# Patient Record
Sex: Male | Born: 1935 | Race: White | Hispanic: No | Marital: Married | Smoking: Never smoker
Health system: Southern US, Community
[De-identification: ages and names within clinical notes are randomized; demographics above are authoritative.]

## PROBLEM LIST (undated history)

## (undated) DIAGNOSIS — T4145XA Adverse effect of unspecified anesthetic, initial encounter: Secondary | ICD-10-CM

## (undated) DIAGNOSIS — C801 Malignant (primary) neoplasm, unspecified: Secondary | ICD-10-CM

## (undated) DIAGNOSIS — T8859XA Other complications of anesthesia, initial encounter: Secondary | ICD-10-CM

## (undated) DIAGNOSIS — H9190 Unspecified hearing loss, unspecified ear: Secondary | ICD-10-CM

## (undated) DIAGNOSIS — D649 Anemia, unspecified: Secondary | ICD-10-CM

## (undated) DIAGNOSIS — M1990 Unspecified osteoarthritis, unspecified site: Secondary | ICD-10-CM

## (undated) DIAGNOSIS — E785 Hyperlipidemia, unspecified: Secondary | ICD-10-CM

## (undated) DIAGNOSIS — I10 Essential (primary) hypertension: Secondary | ICD-10-CM

## (undated) DIAGNOSIS — E119 Type 2 diabetes mellitus without complications: Secondary | ICD-10-CM

## (undated) DIAGNOSIS — N400 Enlarged prostate without lower urinary tract symptoms: Secondary | ICD-10-CM

## (undated) HISTORY — PX: HAND SURGERY: SHX662

## (undated) HISTORY — DX: Malignant (primary) neoplasm, unspecified: C80.1

## (undated) HISTORY — PX: APPENDECTOMY: SHX54

## (undated) HISTORY — PX: KNEE SURGERY: SHX244

## (undated) HISTORY — DX: Essential (primary) hypertension: I10

## (undated) HISTORY — DX: Unspecified hearing loss, unspecified ear: H91.90

---

## 2005-07-24 ENCOUNTER — Ambulatory Visit: Payer: Self-pay | Admitting: Urology

## 2006-04-26 ENCOUNTER — Ambulatory Visit: Payer: Self-pay

## 2006-11-08 ENCOUNTER — Ambulatory Visit: Payer: Self-pay

## 2008-09-21 ENCOUNTER — Ambulatory Visit: Payer: Self-pay | Admitting: General Surgery

## 2008-09-21 HISTORY — PX: COLONOSCOPY: SHX174

## 2010-10-15 ENCOUNTER — Emergency Department: Payer: Self-pay | Admitting: Emergency Medicine

## 2012-02-21 ENCOUNTER — Emergency Department: Payer: Self-pay | Admitting: Internal Medicine

## 2013-09-05 ENCOUNTER — Ambulatory Visit: Payer: Self-pay | Admitting: General Surgery

## 2013-10-04 ENCOUNTER — Encounter: Payer: Self-pay | Admitting: *Deleted

## 2014-01-18 DIAGNOSIS — H9190 Unspecified hearing loss, unspecified ear: Secondary | ICD-10-CM | POA: Insufficient documentation

## 2014-01-18 DIAGNOSIS — I10 Essential (primary) hypertension: Secondary | ICD-10-CM | POA: Insufficient documentation

## 2014-12-30 ENCOUNTER — Ambulatory Visit
Admission: EM | Admit: 2014-12-30 | Discharge: 2014-12-30 | Disposition: A | Discharge status: Home / Self Care | Payer: Medicare Other | Attending: Internal Medicine | Admitting: Internal Medicine

## 2014-12-30 DIAGNOSIS — J42 Unspecified chronic bronchitis: Secondary | ICD-10-CM

## 2014-12-30 DIAGNOSIS — J209 Acute bronchitis, unspecified: Secondary | ICD-10-CM

## 2014-12-30 HISTORY — DX: Benign prostatic hyperplasia without lower urinary tract symptoms: N40.0

## 2014-12-30 HISTORY — DX: Hyperlipidemia, unspecified: E78.5

## 2014-12-30 HISTORY — DX: Type 2 diabetes mellitus without complications: E11.9

## 2014-12-30 MED ORDER — AZITHROMYCIN 250 MG PO TABS: 250.0000 mg | ORAL_TABLET | Freq: Every day | ORAL | Status: DC

## 2014-12-30 MED ORDER — PREDNISONE 50 MG PO TABS: 50.0000 mg | ORAL_TABLET | Freq: Every day | ORAL | Status: AC

## 2014-12-30 NOTE — ED Notes (Signed)
Patient states that originally started as a runny nose and has lead into coughing. Patient states that he is not sleeping at night because he is coughing so much. Patient states that he has a productive cough.

## 2014-12-30 NOTE — ED Provider Notes (Signed)
CSN: [id number]     Arrival date & time 12/30/14  1021 History   First MD Initiated Contact with Patient 12/30/14 1211     Chief Complaint  Patient presents with  . Cough    Started 1 week ago    HPI   Runny nose for the last week, prod cough for the last 2 nights.  No fever. Initially some sore throat, resolved now. No N/V, not achey.  Little bit of sore belly with coughing.  Hx bronchitis.  Past Medical History  Diagnosis Date  . Hypertension   . Hyperlipidemia   . Diabetes type 2, controlled   . BPH (benign prostatic hypertrophy)    Past Surgical History  Procedure Laterality Date  . Hand surgery    . Knee surgery Left   . Appendectomy    . Colonoscopy  09/21/08    1 polyp found, tubular adenoma   Family History  Problem Relation Age of Onset  . Heart disease Father   . Aneurysm Mother   . Aneurysm Brother    History  Substance Use Topics  . Smoking status: Never Smoker   . Smokeless tobacco: Never Used  . Alcohol Use: No    Review of Systems  All other systems reviewed and are negative.   Allergies  Review of patient's allergies indicates no known allergies.  Home Medications   Prior to Admission medications   Medication Sig Start Date End Date Taking? Authorizing Provider  finasteride (PROSCAR) 5 MG tablet Take 5 mg by mouth daily.   Yes Historical Provider, MD  lisinopril (PRINIVIL,ZESTRIL) 10 MG tablet Take 10 mg by mouth daily.   Yes Historical Provider, MD  metformin (FORTAMET) 1000 MG (OSM) 24 hr tablet Take 1,000 mg by mouth 2 (two) times daily with a meal.   Yes Historical Provider, MD  simvastatin (ZOCOR) 40 MG tablet Take 40 mg by mouth daily.   Yes Historical Provider, MD   BP 168/73 mmHg  Pulse 93  Temp(Src) 98.6 F (37 C) (Oral)  Ht 6\' 1"  (1.854 m)  Wt 187 lb (84.823 kg)  BMI 24.68 kg/m2  SpO2 94% Physical Exam  Constitutional: He is oriented to person, place, and time. He appears well-developed and well-nourished. No distress.  HENT:   Head: Atraumatic.  Hard of hearing, B hearing aids. B TMs dull, no erythema. Marked nasal congestion. Throat a little red.  Eyes:  Conjugate gaze, no eye redness/drainage  Neck: Neck supple.  Cardiovascular: Normal rate and regular rhythm.   Pulmonary/Chest: Effort normal. No respiratory distress. He has wheezes.  Symmetric breath sounds, wheezing throughout. Scattered rhonchi.  Abdominal: He exhibits no distension.  Musculoskeletal: Normal range of motion.  Neurological: He is alert and oriented to person, place, and time.  Skin: Skin is warm and dry.  Nursing note and vitals reviewed.   ED Course  Procedures (including critical care time) Labs Review Labs Reviewed - No data to display  Imaging Review No results found.   MDM  No diagnosis found. exacerabation of chronic bronchitis    Sherlene Shams, MD 12/30/14 2128

## 2016-07-21 ENCOUNTER — Ambulatory Visit: Payer: Medicare Other | Admitting: Podiatry

## 2017-08-17 ENCOUNTER — Inpatient Hospital Stay
Admission: AD | Admit: 2017-08-17 | Discharge: 2017-08-19 | DRG: 812 | Disposition: A | Discharge status: Home / Self Care | Payer: Medicare Other | Source: Ambulatory Visit | Attending: Internal Medicine | Admitting: Internal Medicine

## 2017-08-17 ENCOUNTER — Other Ambulatory Visit: Payer: Self-pay

## 2017-08-17 ENCOUNTER — Encounter: Payer: Self-pay | Admitting: Internal Medicine

## 2017-08-17 DIAGNOSIS — Z7984 Long term (current) use of oral hypoglycemic drugs: Secondary | ICD-10-CM

## 2017-08-17 DIAGNOSIS — E860 Dehydration: Secondary | ICD-10-CM | POA: Diagnosis present

## 2017-08-17 DIAGNOSIS — E119 Type 2 diabetes mellitus without complications: Secondary | ICD-10-CM | POA: Diagnosis present

## 2017-08-17 DIAGNOSIS — D649 Anemia, unspecified: Principal | ICD-10-CM | POA: Diagnosis present

## 2017-08-17 DIAGNOSIS — D709 Neutropenia, unspecified: Secondary | ICD-10-CM

## 2017-08-17 DIAGNOSIS — N400 Enlarged prostate without lower urinary tract symptoms: Secondary | ICD-10-CM | POA: Diagnosis present

## 2017-08-17 DIAGNOSIS — Z7982 Long term (current) use of aspirin: Secondary | ICD-10-CM

## 2017-08-17 DIAGNOSIS — I10 Essential (primary) hypertension: Secondary | ICD-10-CM | POA: Diagnosis present

## 2017-08-17 DIAGNOSIS — E785 Hyperlipidemia, unspecified: Secondary | ICD-10-CM | POA: Diagnosis present

## 2017-08-17 DIAGNOSIS — H9190 Unspecified hearing loss, unspecified ear: Secondary | ICD-10-CM | POA: Diagnosis present

## 2017-08-17 DIAGNOSIS — Z79899 Other long term (current) drug therapy: Secondary | ICD-10-CM

## 2017-08-17 LAB — COMPREHENSIVE METABOLIC PANEL
ALBUMIN: 3.8 g/dL (ref 3.5–5.0)
ALK PHOS: 29 U/L — AB (ref 38–126)
ALT: 13 U/L — AB (ref 17–63)
AST: 20 U/L (ref 15–41)
Anion gap: 8 (ref 5–15)
BUN: 26 mg/dL — ABNORMAL HIGH (ref 6–20)
CALCIUM: 8.6 mg/dL — AB (ref 8.9–10.3)
CO2: 25 mmol/L (ref 22–32)
CREATININE: 0.77 mg/dL (ref 0.61–1.24)
Chloride: 103 mmol/L (ref 101–111)
GFR calc Af Amer: >60 mL/min (ref >60)
GFR calc non Af Amer: >60 mL/min (ref >60)
GLUCOSE: 230 mg/dL — AB (ref 65–99)
Potassium: 4.2 mmol/L (ref 3.5–5.1)
SODIUM: 136 mmol/L (ref 135–145)
Total Bilirubin: 0.5 mg/dL (ref 0.3–1.2)
Total Protein: 6.3 g/dL — ABNORMAL LOW (ref 6.5–8.1)

## 2017-08-17 LAB — RETICULOCYTES
RBC.: 2.29 MIL/uL — AB (ref 4.40–5.90)
Retic Count, Absolute: 59.5 10*3/uL (ref 19.0–183.0)
Retic Ct Pct: 2.6 % (ref 0.4–3.1)

## 2017-08-17 LAB — FERRITIN: Ferritin: 106 ng/mL (ref 24–336)

## 2017-08-17 LAB — IRON AND TIBC
Iron: 98 ug/dL (ref 45–182)
Saturation Ratios: 34 % (ref 17.9–39.5)
TIBC: 289 ug/dL (ref 250–450)
UIBC: 191 ug/dL

## 2017-08-17 LAB — CBC
HCT: 21.3 % — ABNORMAL LOW (ref 40.0–52.0)
HEMOGLOBIN: 6.7 g/dL — AB (ref 13.0–18.0)
MCH: 29.4 pg (ref 26.0–34.0)
MCHC: 31.7 g/dL — AB (ref 32.0–36.0)
MCV: 92.6 fL (ref 80.0–100.0)
Platelets: 203 10*3/uL (ref 150–440)
RBC: 2.29 MIL/uL — ABNORMAL LOW (ref 4.40–5.90)
RDW: 20.8 % — ABNORMAL HIGH (ref 11.5–14.5)
WBC: 3 10*3/uL — ABNORMAL LOW (ref 3.8–10.6)

## 2017-08-17 LAB — TSH: TSH: 2.688 u[IU]/mL (ref 0.350–4.500)

## 2017-08-17 LAB — PROTIME-INR
INR: 1.02
PROTHROMBIN TIME: 13.3 s (ref 11.4–15.2)

## 2017-08-17 LAB — GLUCOSE, CAPILLARY
Glucose-Capillary: 265 mg/dL — ABNORMAL HIGH (ref 65–99)
Glucose-Capillary: 172 mg/dL — ABNORMAL HIGH (ref 65–99)

## 2017-08-17 LAB — FOLATE: Folate: 22 ng/mL (ref >5.9)

## 2017-08-17 LAB — PREPARE RBC (CROSSMATCH)

## 2017-08-17 LAB — ABO/RH: ABO/RH(D): B POS

## 2017-08-17 MED ORDER — LISINOPRIL 10 MG PO TABS
10.0000 mg | ORAL_TABLET | Freq: Every day | ORAL | Status: DC
  Administered 2017-08-18 – 2017-08-19 (×2): 10 mg via ORAL
  Filled 2017-08-17 (×2): qty 1

## 2017-08-17 MED ORDER — ACETAMINOPHEN 325 MG PO TABS: 650.0000 mg | ORAL_TABLET | Freq: Four times a day (QID) | ORAL | Status: DC | PRN

## 2017-08-17 MED ORDER — ONDANSETRON HCL 4 MG PO TABS: 4.0000 mg | ORAL_TABLET | Freq: Four times a day (QID) | ORAL | Status: DC | PRN

## 2017-08-17 MED ORDER — SODIUM CHLORIDE 0.9 % IV SOLN
INTRAVENOUS | Status: DC
  Administered 2017-08-17: 21:00:00 via INTRAVENOUS

## 2017-08-17 MED ORDER — ONDANSETRON HCL 4 MG/2ML IJ SOLN: 4.0000 mg | Freq: Four times a day (QID) | INTRAMUSCULAR | Status: DC | PRN

## 2017-08-17 MED ORDER — METFORMIN HCL ER 500 MG PO TB24
1000.0000 mg | ORAL_TABLET | Freq: Two times a day (BID) | ORAL | Status: DC
  Administered 2017-08-18 – 2017-08-19 (×3): 1000 mg via ORAL
  Filled 2017-08-17 (×4): qty 2

## 2017-08-17 MED ORDER — INSULIN ASPART 100 UNIT/ML ~~LOC~~ SOLN: 0.0000 [IU] | Freq: Every day | SUBCUTANEOUS | Status: DC

## 2017-08-17 MED ORDER — SODIUM CHLORIDE 0.9 % IV SOLN
Freq: Once | INTRAVENOUS | Status: AC
  Administered 2017-08-17: 23:00:00 via INTRAVENOUS

## 2017-08-17 MED ORDER — ACETAMINOPHEN 650 MG RE SUPP: 650.0000 mg | Freq: Four times a day (QID) | RECTAL | Status: DC | PRN

## 2017-08-17 MED ORDER — INSULIN ASPART 100 UNIT/ML ~~LOC~~ SOLN
0.0000 [IU] | Freq: Three times a day (TID) | SUBCUTANEOUS | Status: DC
  Administered 2017-08-18 – 2017-08-19 (×4): 2 [IU] via SUBCUTANEOUS
  Filled 2017-08-17 (×3): qty 1

## 2017-08-17 MED ORDER — SIMVASTATIN 40 MG PO TABS
40.0000 mg | ORAL_TABLET | Freq: Every day | ORAL | Status: DC
  Administered 2017-08-17: 40 mg via ORAL
  Filled 2017-08-17 (×2): qty 1

## 2017-08-17 MED ORDER — FINASTERIDE 5 MG PO TABS
5.0000 mg | ORAL_TABLET | Freq: Every day | ORAL | Status: DC
  Administered 2017-08-18 – 2017-08-19 (×2): 5 mg via ORAL
  Filled 2017-08-17 (×2): qty 1

## 2017-08-17 NOTE — H&P (Signed)
[facility] [place] NAME: Paul Pham    MR#:  [id number]  DATE OF BIRTH:  1926/08/19  DATE OF ADMISSION:  08/17/2017  PRIMARY CARE PHYSICIAN: Patient, No Pcp Per   REQUESTING/REFERRING PHYSICIAN: Dr. Ginette Pitman  CHIEF COMPLAINT:  No chief complaint on file.   HISTORY OF PRESENT ILLNESS:  Paul Pham  is a 83 y.o. male with a known history of non-insulin-dependent diabetes mellitus, hyperlipidemia and BPH was sent in by his primary care physician secondary to anemia. Patient is very hard of hearing. Very pleasant elderly gentleman, who has been doing well up until the last month. He started having increased fatigue, dyspnea on a social which is unusual for him. He saw his PCP about 4 days ago and routine labs showed that his hemoglobin dropped from 10 to 7 in 2 months. In the past his hemoglobin was always around 13 up until March of this year. It dropped to 10 in October at which time he was started on iron pills. His stools are always dark from the iron pills, no unusual change, no active bleeding noted. Family denies any hematemesis, abdominal pain or any other bleeding. Patient only takes aspirin at home does not use any NSAIDS. Repeat hemoglobin here is at 6.7. He is being admitted for transfusion for his symptomatic anemia  PAST MEDICAL HISTORY:   Past Medical History:  Diagnosis Date  . BPH (benign prostatic hypertrophy)   . Diabetes type 2, controlled ([place])   . Hyperlipidemia   . Hypertension     PAST SURGICAL HISTORY:   Past Surgical History:  Procedure Laterality Date  . APPENDECTOMY    . COLONOSCOPY  09/21/08   1 polyp found, tubular adenoma  . HAND SURGERY    . KNEE SURGERY Left     SOCIAL HISTORY:   Social History   Tobacco Use  . Smoking status: Never Smoker  . Smokeless tobacco: Never Used  Substance Use Topics  . Alcohol use: No    Alcohol/week: 0.0 oz    FAMILY HISTORY:   Family History  Problem  Relation Age of Onset  . Aneurysm Mother   . Heart disease Father   . Aneurysm Brother     DRUG ALLERGIES:  No Known Allergies  REVIEW OF SYSTEMS:   Review of Systems  Constitutional: Negative for chills, fever, malaise/fatigue and weight loss.  HENT: Negative for ear discharge, ear pain, hearing loss, nosebleeds and tinnitus.   Eyes: Negative for blurred vision, double vision and photophobia.  Respiratory: Positive for shortness of breath. Negative for cough, hemoptysis and wheezing.   Cardiovascular: Negative for chest pain, palpitations, orthopnea and leg swelling.  Gastrointestinal: Negative for abdominal pain, constipation, diarrhea, heartburn, melena, nausea and vomiting.  Genitourinary: Negative for dysuria, frequency, hematuria and urgency.  Musculoskeletal: Negative for back pain, myalgias and neck pain.  Skin: Negative for rash.  Neurological: Positive for weakness. Negative for dizziness, tingling, tremors, sensory change, speech change, focal weakness and headaches.  Endo/Heme/Allergies: Does not bruise/bleed easily.  Psychiatric/Behavioral: Negative for depression.    MEDICATIONS AT HOME:   Prior to Admission medications   Medication Sig Start Date End Date Taking? Authorizing Provider  aspirin EC 81 MG tablet Take 81 mg by mouth daily.   Yes [provider]  finasteride (PROSCAR) 5 MG tablet Take 5 mg by mouth daily.   Yes [provider]  lisinopril (PRINIVIL,ZESTRIL) 10 MG tablet Take 10 mg by mouth daily.  Yes [provider]  metformin (FORTAMET) 1000 MG (OSM) 24 hr tablet Take 1,000 mg by mouth 2 (two) times daily with a meal.   Yes [provider]  simvastatin (ZOCOR) 40 MG tablet Take 40 mg by mouth daily.   Yes [provider]  azithromycin (ZITHROMAX Z-PAK) 250 MG tablet Take 1 tablet (250 mg total) by mouth daily. Take 2 tabs today, then 1 tab daily starting tomorrow Patient not taking: Reported on 08/17/2017  12/30/14   Sherlene Shams, MD      VITAL SIGNS:  Blood pressure (!) 143/50, pulse 82, temperature 98.6 F (37 C), temperature source Oral, resp. rate 20, height 6\' 1"  (1.854 m), weight 84.6 kg (186 lb 8 oz), SpO2 99 %.  PHYSICAL EXAMINATION:   Physical Exam  GENERAL:  83 y.o.-year-old elderly patient lying in the bed with no acute distress. Very hard of hearing EYES: Pupils equal, round, reactive to light and accommodation. No scleral icterus. Extraocular muscles intact.  HEENT: Head atraumatic, normocephalic. Oropharynx and nasopharynx clear.  NECK:  Supple, no jugular venous distention. No thyroid enlargement, no tenderness.  LUNGS: Normal breath sounds bilaterally, no wheezing, rales,rhonchi or crepitation. No use of accessory muscles of respiration.  CARDIOVASCULAR: S1, S2 normal. No murmurs, rubs, or gallops.  ABDOMEN: Soft, nontender, nondistended. Bowel sounds present. No organomegaly or mass.  EXTREMITIES: No pedal edema, cyanosis, or clubbing.  NEUROLOGIC: Cranial nerves II through XII are intact. Muscle strength 5/5 in all extremities. Sensation intact. Gait not checked.  PSYCHIATRIC: The patient is alert and oriented x 3.  SKIN: No obvious rash, lesion, or ulcer.   LABORATORY PANEL:   CBC Recent Labs  Lab 08/17/17 2024  WBC 3.0*  HGB 6.7*  HCT 21.3*  PLT 203   ------------------------------------------------------------------------------------------------------------------  Chemistries  No results for input(s): NA, K, CL, CO2, GLUCOSE, BUN, CREATININE, CALCIUM, MG, AST, ALT, ALKPHOS, BILITOT in the last 168 hours.  Invalid input(s): GFRCGP ------------------------------------------------------------------------------------------------------------------  Cardiac Enzymes No results for input(s): TROPONINI in the last 168 hours. ------------------------------------------------------------------------------------------------------------------  RADIOLOGY:  No  results found.  EKG:  No orders found for this or any previous visit.  IMPRESSION AND PLAN:   Paul Pham  is a 83 y.o. male with a known history of non-insulin-dependent diabetes mellitus, hyperlipidemia and BPH was sent in by his primary care physician secondary to anemia.  1. Acute anemia-unknown cause at this time. No active bleeding noted. -Last colonoscopy in 2010 showed only benign polyp. Stool for guaiac was negative as outpatient. -Steady drop in his WBC and hemoglobin noted. Could be a bone marrow issue. -Anemia labs ordered. Hematology consult. -If stable, further workup can be done as outpatient -2 units packed RBC transfusion ordered for symptomatic anemia and follow-up repeat hemoglobin tomorrow  2. Hypertension-continue lisinopril  3. Diabetes mellitus-on metformin and sliding scale insulin  4. Hyperlipidemia on statin  5. DVT prophylaxis-Ted's and SCDs    All the records are reviewed and case discussed with ED provider. Management plans discussed with the patient, family and they are in agreement.  CODE STATUS: Full Code  TOTAL TIME TAKING CARE OF THIS PATIENT: 50 minutes.    Gladstone Lighter M.D on 08/17/2017 at 9:44 PM  Between 7am to 6pm - Pager - [phone]  After 6pm go to [email or web address] - password [email or web address] [facility]  Office  [phone]  CC: Primary care physician; Patient, No Pcp Per

## 2017-08-18 DIAGNOSIS — Z7984 Long term (current) use of oral hypoglycemic drugs: Secondary | ICD-10-CM | POA: Diagnosis not present

## 2017-08-18 DIAGNOSIS — D649 Anemia, unspecified: Principal | ICD-10-CM

## 2017-08-18 DIAGNOSIS — E119 Type 2 diabetes mellitus without complications: Secondary | ICD-10-CM | POA: Diagnosis present

## 2017-08-18 DIAGNOSIS — E785 Hyperlipidemia, unspecified: Secondary | ICD-10-CM | POA: Diagnosis present

## 2017-08-18 DIAGNOSIS — D709 Neutropenia, unspecified: Secondary | ICD-10-CM | POA: Diagnosis present

## 2017-08-18 DIAGNOSIS — H9190 Unspecified hearing loss, unspecified ear: Secondary | ICD-10-CM | POA: Diagnosis present

## 2017-08-18 DIAGNOSIS — Z79899 Other long term (current) drug therapy: Secondary | ICD-10-CM | POA: Diagnosis not present

## 2017-08-18 DIAGNOSIS — I10 Essential (primary) hypertension: Secondary | ICD-10-CM | POA: Diagnosis present

## 2017-08-18 DIAGNOSIS — N400 Enlarged prostate without lower urinary tract symptoms: Secondary | ICD-10-CM | POA: Diagnosis present

## 2017-08-18 DIAGNOSIS — Z7982 Long term (current) use of aspirin: Secondary | ICD-10-CM | POA: Diagnosis not present

## 2017-08-18 DIAGNOSIS — D72819 Decreased white blood cell count, unspecified: Secondary | ICD-10-CM

## 2017-08-18 DIAGNOSIS — E860 Dehydration: Secondary | ICD-10-CM | POA: Diagnosis present

## 2017-08-18 LAB — CBC
HEMATOCRIT: 24.8 % — AB (ref 40.0–52.0)
Hemoglobin: 8 g/dL — ABNORMAL LOW (ref 13.0–18.0)
MCH: 29.3 pg (ref 26.0–34.0)
MCHC: 32.1 g/dL (ref 32.0–36.0)
MCV: 91.3 fL (ref 80.0–100.0)
Platelets: 192 10*3/uL (ref 150–440)
RBC: 2.72 MIL/uL — ABNORMAL LOW (ref 4.40–5.90)
RDW: 20.1 % — ABNORMAL HIGH (ref 11.5–14.5)
WBC: 3.2 10*3/uL — ABNORMAL LOW (ref 3.8–10.6)

## 2017-08-18 LAB — VITAMIN B12: Vitamin B-12: 658 pg/mL (ref 180–914)

## 2017-08-18 LAB — BPAM RBC
Blood Product Expiration Date: 201901122359
ISSUE DATE / TIME: 201812182219
Unit Type and Rh: 7300

## 2017-08-18 LAB — DIFFERENTIAL
BAND NEUTROPHILS: 0 %
BASOS PCT: 0 %
BLASTS: 0 %
Basophils Absolute: 0 10*3/uL (ref 0–0.1)
EOS PCT: 0 %
Eosinophils Absolute: 0 10*3/uL (ref 0–0.7)
LYMPHS ABS: 1.8 10*3/uL (ref 1.0–3.6)
LYMPHS PCT: 58 %
Metamyelocytes Relative: 0 %
Monocytes Absolute: 0.2 10*3/uL (ref 0.2–1.0)
Monocytes Relative: 6 %
Myelocytes: 0 %
NRBC: 0 /100{WBCs}
Neutro Abs: 1.2 10*3/uL — ABNORMAL LOW (ref 1.4–6.5)
Neutrophils Relative %: 36 %
OTHER: 0 %
Promyelocytes Absolute: 0 %

## 2017-08-18 LAB — GLUCOSE, CAPILLARY
GLUCOSE-CAPILLARY: 166 mg/dL — AB (ref 65–99)
GLUCOSE-CAPILLARY: 136 mg/dL — AB (ref 65–99)
Glucose-Capillary: 157 mg/dL — ABNORMAL HIGH (ref 65–99)
Glucose-Capillary: 146 mg/dL — ABNORMAL HIGH (ref 65–99)

## 2017-08-18 LAB — TYPE AND SCREEN
ABO/RH(D): B POS
Antibody Screen: NEGATIVE
Unit division: 0

## 2017-08-18 LAB — BASIC METABOLIC PANEL
Anion gap: 7 (ref 5–15)
BUN: 22 mg/dL — ABNORMAL HIGH (ref 6–20)
CALCIUM: 8.5 mg/dL — AB (ref 8.9–10.3)
CO2: 26 mmol/L (ref 22–32)
Chloride: 103 mmol/L (ref 101–111)
Creatinine, Ser: 0.58 mg/dL — ABNORMAL LOW (ref 0.61–1.24)
GFR calc Af Amer: >60 mL/min (ref >60)
GFR calc non Af Amer: >60 mL/min (ref >60)
GLUCOSE: 159 mg/dL — AB (ref 65–99)
Potassium: 3.8 mmol/L (ref 3.5–5.1)
Sodium: 136 mmol/L (ref 135–145)

## 2017-08-18 LAB — HEMOGLOBIN A1C
HEMOGLOBIN A1C: 6.7 % — AB (ref 4.8–5.6)
Mean Plasma Glucose: 145.59 mg/dL

## 2017-08-18 MED ORDER — SIMVASTATIN 40 MG PO TABS
40.0000 mg | ORAL_TABLET | Freq: Every day | ORAL | Status: DC
  Administered 2017-08-18 – 2017-08-19 (×2): 40 mg via ORAL
  Filled 2017-08-18 (×6): qty 1

## 2017-08-18 MED ORDER — SODIUM CHLORIDE 0.9 % IV BOLUS (SEPSIS)
1000.0000 mL | Freq: Once | INTRAVENOUS | Status: AC
  Administered 2017-08-18: 1000 mL via INTRAVENOUS

## 2017-08-18 NOTE — Progress Notes (Signed)
MD notified of hgb - 8.0. Per MD hgb is stable. No 2nd unit ordered at this time.

## 2017-08-18 NOTE — Progress Notes (Signed)
Paul Pham NAME: Paul Pham    MR#:  [id number]  DATE OF BIRTH:  [birth year]  SUBJECTIVE:  CHIEF COMPLAINT:  No chief complaint on file.  Generalized weakness.  But feels better. REVIEW OF SYSTEMS:  Review of Systems  Constitutional: Positive for malaise/fatigue. Negative for chills and fever.  HENT: Positive for hearing loss. Negative for sore throat.   Eyes: Negative for blurred vision and double vision.  Respiratory: Negative for cough, hemoptysis, shortness of breath, wheezing and stridor.   Cardiovascular: Negative for chest pain, palpitations, orthopnea and leg swelling.  Gastrointestinal: Negative for abdominal pain, blood in stool, diarrhea, melena, nausea and vomiting.  Genitourinary: Negative for dysuria, flank pain and hematuria.  Musculoskeletal: Negative for back pain and joint pain.  Neurological: Negative for dizziness, sensory change, focal weakness, seizures, loss of consciousness, weakness and headaches.  Endo/Heme/Allergies: Negative for polydipsia.  Psychiatric/Behavioral: Negative for depression. The patient is not nervous/anxious.     DRUG ALLERGIES:  No Known Allergies VITALS:  Blood pressure (!) 144/42, pulse 70, temperature 98.7 F (37.1 C), temperature source Oral, resp. rate 20, height 6\' 1"  (1.854 m), weight 186 lb 8 oz (84.6 kg), SpO2 99 %. PHYSICAL EXAMINATION:  Physical Exam  Constitutional: He is oriented to person, place, and time and well-developed, well-nourished, and in no distress.  HENT:  Head: Normocephalic.  Mouth/Throat: Oropharynx is clear and moist.  Eyes: Conjunctivae and EOM are normal. Pupils are equal, round, and reactive to light. No scleral icterus.  Neck: Normal range of motion. Neck supple. No JVD present. No tracheal deviation present.  Cardiovascular: Normal rate, regular rhythm and normal heart sounds. Exam reveals no gallop.  No murmur heard. Pulmonary/Chest: Effort  normal and breath sounds normal. No respiratory distress. He has no wheezes. He has no rales.  Abdominal: Soft. Bowel sounds are normal. He exhibits no distension. There is no tenderness. There is no rebound.  Musculoskeletal: Normal range of motion. He exhibits no edema or tenderness.  Neurological: He is alert and oriented to person, place, and time. No cranial nerve deficit.  Skin: No rash noted. No erythema.  Psychiatric: Affect normal.   LABORATORY PANEL:  Male CBC Recent Labs  Lab 08/18/17 0213  WBC 3.2*  HGB 8.0*  HCT 24.8*  PLT 192   ------------------------------------------------------------------------------------------------------------------ Chemistries  Recent Labs  Lab 08/17/17 2024 08/18/17 0213  NA 136 136  K 4.2 3.8  CL 103 103  CO2 25 26  GLUCOSE 230* 159*  BUN 26* 22*  CREATININE 0.77 0.58*  CALCIUM 8.6* 8.5*  AST 20  --   ALT 13*  --   ALKPHOS 29*  --   BILITOT 0.5  --    RADIOLOGY:  No results found. ASSESSMENT AND PLAN:    Paul Pham  is a 83 y.o. male with a known history of non-insulin-dependent diabetes mellitus, hyperlipidemia and BPH was sent in by his primary care physician secondary to anemia.  1. Acute anemia-unknown cause at this time. No active bleeding noted. -Last colonoscopy in 2010 showed only benign polyp. Stool for guaiac was negative as outpatient. -Steady drop in his WBC and hemoglobin noted. Could be a bone marrow issue. -Anemia workup and Hematology consult. S/p 2 units packed RBC transfusion. Hb 8.0.  2. Hypertension-continue lisinopril  3. Diabetes mellitus-on metformin and sliding scale insulin  4. Hyperlipidemia on statin  Neutropenia.  Follow-up hematology consult. Dehydration.  Improving.  All the records are reviewed and  case discussed with Care Management/Social Worker. Management plans discussed with the patient, wife and they are in agreement.  CODE STATUS: Full Code  TOTAL TIME TAKING CARE OF  THIS PATIENT: 32 minutes.   More than 50% of the time was spent in counseling/coordination of care: YES  POSSIBLE D/C IN  1 DAYS, DEPENDING ON CLINICAL CONDITION.   Demetrios Loll M.D on 08/18/2017 at 12:45 PM  Between 7am to 6pm - Pager - [phone]  After 6pm go to [email or web address] - [facility]

## 2017-08-18 NOTE — Progress Notes (Signed)
MD notified of low DBP. Orders for 1L bolus x 1. WIll continue to monitor.

## 2017-08-18 NOTE — Evaluation (Signed)
Physical Therapy Evaluation Patient Details Name: Paul Pham. MRN: [id number] DOB: February 27, 1926 Today's Date: 08/18/2017   History of Present Illness  Pt is a89 y.o.malewith a known history of non-insulin-dependent diabetes mellitus, hyperlipidemia and BPH was sent in by his primary care physician secondary to anemia.  Patient is very hard of hearing. Very pleasant elderly gentleman, who has been doing well up until the last month. He started having increased fatigue, dyspnea on a social which is unusual for him. He saw his PCP about 4 days ago and routine labs showed that his hemoglobin dropped from10 to 7 in2 months. In the past his hemoglobin was always around 13 up until March of this year. It dropped to 10 in October at which time he was started on iron pills. His stools are always dark from the iron pills, no unusual change, no active bleeding noted. Family denies any hematemesis, abdominal pain or any other bleeding. Patient only takes aspirin at home does not use anyNSAIDS. Repeat hemoglobin here is at 6.7. He is being admitted for transfusion for his symptomatic anemia.    Clinical Impression  Pt is a very pleasant and motivated 83 yo M who performed very well during PT evaluation.  Orthostatic vitals - Supine: BP 146/43 mmHg, HR 65 bpm; Sitting: 154/51 mmHg, HR 72 bpm; Standing: 142/47 mmHg, HR 73 bpm.  Pt's SpO2 96-98% throughout session on room air with no adverse symptoms reported including after amb 200' without AD.  Pt steady throughout assessment without LOB.  Will complete PT orders at this time but will reassess pending a change in status upon receipt of new PT orders.      Follow Up Recommendations No PT follow up    Equipment Recommendations  None recommended by PT    Recommendations for Other Services       Precautions / Restrictions Precautions Precautions: None Restrictions Weight Bearing Restrictions: No      Mobility  Bed Mobility Overal bed mobility:  Independent                Transfers Overall transfer level: Independent               General transfer comment: Good speed and stability with transfers  Ambulation/Gait Ambulation/Gait assistance: Independent Ambulation Distance (Feet): 200 Feet Assistive device: None Gait Pattern/deviations: WFL(Within Functional Limits)   Gait velocity interpretation: Below normal speed for age/gender General Gait Details: Good stability with gait with no adverse symptoms  Stairs            Wheelchair Mobility    Modified Rankin (Stroke Patients Only)       Balance Overall balance assessment: Independent                                           Pertinent Vitals/Pain Pain Assessment: No/denies pain    Home Living Family/patient expects to be discharged to:: Private residence Living Arrangements: Spouse/significant other Available Help at Discharge: Family;Available 24 hours/day Type of Home: House Home Access: Stairs to enter Entrance Stairs-Rails: None(Holds onto storm door ascending and descending steps) Entrance Stairs-Number of Steps: 2 Home Layout: One level Home Equipment: Cane - single point      Prior Function Level of Independence: Independent         Comments: Pt highly active ambulating 30 min/day without AD, Ind with ADLs, no fall history  Hand Dominance        Extremity/Trunk Assessment   Upper Extremity Assessment Upper Extremity Assessment: Overall WFL for tasks assessed    Lower Extremity Assessment Lower Extremity Assessment: Overall WFL for tasks assessed       Communication   Communication: HOH  Cognition Arousal/Alertness: Awake/alert Behavior During Therapy: WFL for tasks assessed/performed Overall Cognitive Status: Within Functional Limits for tasks assessed                                        General Comments General comments (skin integrity, edema, etc.): Negative Romberg  sign; good stability with feet together with eyes closed and head turns; pt able to perform SLS to BLEs 4-6 sec without LOB.    Exercises Total Joint Exercises Ankle Circles/Pumps: AROM;Both;10 reps Quad Sets: Strengthening;Both;10 reps Gluteal Sets: Strengthening;Both;10 reps Hip ABduction/ADduction: AROM;Both;10 reps Straight Leg Raises: AROM;Both;10 reps Long Arc Quad: AROM;Both;10 reps Knee Flexion: AROM;10 reps Marching in Standing: AROM;Both;10 reps(Seated) Other Exercises Other Exercises: HEP education per above exercises 2-3x/day while in acute care to minimize physical decline   Assessment/Plan    PT Assessment [place] does not need any further PT services  PT Problem List         PT Treatment Interventions      PT Goals (Current goals can be found in the Care Plan section)  Acute Rehab PT Goals PT Goal Formulation: All assessment and education complete, DC therapy    Frequency     Barriers to discharge        Co-evaluation               AM-PAC PT "6 Clicks" Daily Activity  Outcome Measure Difficulty turning over in bed (including adjusting bedclothes, sheets and blankets)?: None Difficulty moving from lying on back to sitting on the side of the bed? : None Difficulty sitting down on and standing up from a chair with arms (e.g., wheelchair, bedside commode, etc,.)?: None Help needed moving to and from a bed to chair (including a wheelchair)?: None Help needed walking in hospital room?: None Help needed climbing 3-5 steps with a railing? : None 6 Click Score: 24    End of Session Equipment Utilized During Treatment: Gait belt Activity Tolerance: Patient tolerated treatment well Patient left: in bed;with call bell/phone within reach;with family/visitor present Nurse Communication: Mobility status PT Visit Diagnosis: Muscle weakness (generalized) (M62.81)    Time: 4650-3546 PT Time Calculation (min) (ACUTE ONLY): 37 min   Charges:   PT  Evaluation $PT Eval Low Complexity: 1 Low PT Treatments $Therapeutic Exercise: 8-22 mins   PT G Codes:   PT G-Codes **NOT FOR INPATIENT CLASS** Functional Assessment Tool Used: AM-PAC 6 Clicks Basic Mobility Functional Limitation: Mobility: Walking and moving around Mobility: Walking and Moving Around Current Status (F6812): 0 percent impaired, limited or restricted Mobility: Walking and Moving Around Goal Status (X5170): 0 percent impaired, limited or restricted Mobility: Walking and Moving Around Discharge Status (Y1749): 0 percent impaired, limited or restricted    D. Scott Keriann Rankin PT, DPT 08/18/17, 1:14 PM

## 2017-08-18 NOTE — Consult Note (Signed)
Hematology/Oncology Consult note [facility] Telephone:([phone] [phone]  Patient Care Team: Patient, No Pcp Per as PCP - General (General Practice)   Name of the patient: Paul Pham  [id number]  [birth year]   Date of visit: 08/18/17 REASON FOR COSULTATION:  Anemia evaluation.  History of presenting illness-  Patient is an 83 year old male with no history of diabetes, hyperlipidemia, BPH who was sent to Hospital for evaluation by PCP due to low hemoglobin. Patient is very hard of hearing. He reports that he has been doing fine up until recently, he started to have increased fatigue, dyspnea. His hemoglobin has dropped from baseline of 10-7 in the past couple of months. His last colonoscopy was 10 years ago. Denies seeing any melena or blood in the stool. He takes iron pill and the stool is always black. Denies any hematemesis, abdominal pain, swallowing difficulty, cough. Denies any NSAID use.  After admission, patient was chance fused with PRBC for symptomatic anemia. Oncology was consulted for evaluation of anemia. Patient feels fairly well today, with better energy level. Wife is at bedside. Patient denies any chest pain, shortness of breath.  Review of systems- Review of Systems  Constitutional: Positive for malaise/fatigue. Negative for fever.  HENT: Negative for hearing loss.   Eyes: Negative for blurred vision.  Respiratory: Positive for shortness of breath. Negative for cough and hemoptysis.   Cardiovascular: Negative for chest pain, palpitations and orthopnea.  Gastrointestinal: Negative for abdominal pain and heartburn.  Genitourinary: Negative for dysuria.  Musculoskeletal: Negative for myalgias.  Skin: Negative for rash.  Neurological: Negative for dizziness and headaches.  Endo/Heme/Allergies: Does not bruise/bleed easily.  Psychiatric/Behavioral: Negative for depression.    No Known Allergies  Patient Active Problem List   Diagnosis Date Noted  . Anemia 08/17/2017     Past Medical History:  Diagnosis Date  . BPH (benign prostatic hypertrophy)   . Diabetes type 2, controlled ([place])   . Hyperlipidemia   . Hypertension      Past Surgical History:  Procedure Laterality Date  . APPENDECTOMY    . COLONOSCOPY  09/21/08   1 polyp found, tubular adenoma  . HAND SURGERY    . KNEE SURGERY Left     Social History   Socioeconomic History  . Marital status: Married    Spouse name: Not on file  . Number of children: Not on file  . Years of education: Not on file  . Highest education level: Not on file  Social Needs  . Financial resource strain: Not on file  . Food insecurity - worry: Not on file  . Food insecurity - inability: Not on file  . Transportation needs - medical: Not on file  . Transportation needs - non-medical: Not on file  Occupational History  . Not on file  Tobacco Use  . Smoking status: Never Smoker  . Smokeless tobacco: Never Used  Substance and Sexual Activity  . Alcohol use: No    Alcohol/week: 0.0 oz  . Drug use: No  . Sexual activity: Not on file  Other Topics Concern  . Not on file  Social History Narrative  . Not on file     Family History  Problem Relation Age of Onset  . Aneurysm Mother   . Heart disease Father   . Aneurysm Brother      Current Facility-Administered Medications:  .  acetaminophen (TYLENOL) tablet 650 mg, 650 mg, Oral, Q6H PRN **OR** acetaminophen (TYLENOL) suppository 650 mg, 650 mg, Rectal,  Q6H PRN, Gladstone Lighter, MD .  finasteride (PROSCAR) tablet 5 mg, 5 mg, Oral, Daily, Gladstone Lighter, MD, 5 mg at 08/18/17 1023 .  insulin aspart (novoLOG) injection 0-5 Units, 0-5 Units, Subcutaneous, QHS, Kalisetti, Radhika, MD .  insulin aspart (novoLOG) injection 0-9 Units, 0-9 Units, Subcutaneous, TID WC, Gladstone Lighter, MD, 2 Units at 08/18/17 1258 .  lisinopril (PRINIVIL,ZESTRIL) tablet 10 mg, 10 mg, Oral, Daily, Gladstone Lighter, MD, 10  mg at 08/18/17 1023 .  metFORMIN (GLUCOPHAGE-XR) 24 hr tablet 1,000 mg, 1,000 mg, Oral, BID WC, Gladstone Lighter, MD, 1,000 mg at 08/18/17 0848 .  ondansetron (ZOFRAN) tablet 4 mg, 4 mg, Oral, Q6H PRN **OR** ondansetron (ZOFRAN) injection 4 mg, 4 mg, Intravenous, Q6H PRN, Tressia Miners, Radhika, MD .  simvastatin (ZOCOR) tablet 40 mg, 40 mg, Oral, Daily, Gladstone Lighter, MD, 40 mg at 08/17/17 2113   Physical exam:  Vitals:   08/18/17 0521 08/18/17 0522 08/18/17 1259 08/18/17 1347  BP: (!) 131/40 (!) 144/42  (!) 126/45  Pulse: 68 70  66  Resp:      Temp:    98.1 F (36.7 C)  TempSrc:    Oral  SpO2: 99% 99% 98% 96%  Weight:      Height:       GENERAL:Alert, no distress and comfortable.  EYES: no  icterus OROPHARYNX: no thrush or ulceration; good dentition  NECK: supple, no masses felt LYMPH:  no palpable lymphadenopathy in the cervical, axillary or inguinal regions LUNGS: clear to auscultation and  No wheeze or crackles HEART/CVS: regular rate & rhythm and no murmurs; No lower extremity edema ABDOMEN: abdomen soft, non-tender and normal bowel sounds. Can not appreciate hepatosplenomegaly.  Musculoskeletal:no cyanosis of digits and no clubbing  PSYCH: alert & oriented NEURO: hard hearing. no focal motor/sensory deficits SKIN:  no rashes or significant lesions     CMP Latest Ref Rng & Units 08/18/2017  Glucose 65 - 99 mg/dL 159(H)  BUN 6 - 20 mg/dL 22(H)  Creatinine 0.61 - 1.24 mg/dL 0.58(L)  Sodium 135 - 145 mmol/L 136  Potassium 3.5 - 5.1 mmol/L 3.8  Chloride 101 - 111 mmol/L 103  CO2 22 - 32 mmol/L 26  Calcium 8.9 - 10.3 mg/dL 8.5(L)  Total Protein 6.5 - 8.1 g/dL -  Total Bilirubin 0.3 - 1.2 mg/dL -  Alkaline Phos 38 - 126 U/L -  AST 15 - 41 U/L -  ALT 17 - 63 U/L -   CBC Latest Ref Rng & Units 08/18/2017  WBC 3.8 - 10.6 K/uL 3.2(L)  Hemoglobin 13.0 - 18.0 g/dL 8.0(L)  Hematocrit 40.0 - 52.0 % 24.8(L)  Platelets 150 - 440 K/uL 192    Assessment and plan-  Patient is a 83 y.o. male presents with symptomatic anemia.   1 Symptomatic anemia: s/p 2PRBC transfusion. Per note stool occult was negative as outpatient. UA negative for hematuria. Normal B12 and folate levels.  2 Leukopenia, Added differential to cbc, which showed mild neutropenia.   Anemia: less likely blood loss given negative stool occult, negative urine RBCs, normal ferritin level. Normal TSH per outpatient workup.  Rule out hemolysis and underlying bone marrow disorders, such as MDS.  Check  reticulocytes,  blood smear,  LDH, haptoglobin. If no acute hemolysis and hemoglobin is stable tomorrow, he can be discharged from hematology aspect and follow up with me in clinic.  other etiology such as bone marrow disorder work up and Monoclonal gammopathy work up can be done as outpatient.   Thank  you for this kind referral and the opportunity to participate in the care of this patient  Dr. Earlie Server, MD, PhD [facility] Pager- [phone] 08/18/2017

## 2017-08-19 LAB — RETICULOCYTES
RBC.: 3.1 MIL/uL — AB (ref 4.40–5.90)
RETIC COUNT ABSOLUTE: 96.1 10*3/uL (ref 19.0–183.0)
Retic Ct Pct: 3.1 % (ref 0.4–3.1)

## 2017-08-19 LAB — GLUCOSE, CAPILLARY: Glucose-Capillary: 160 mg/dL — ABNORMAL HIGH (ref 65–99)

## 2017-08-19 LAB — LACTATE DEHYDROGENASE: LDH: 162 U/L (ref 98–192)

## 2017-08-19 LAB — HEMOGLOBIN: HEMOGLOBIN: 8.8 g/dL — AB (ref 13.0–18.0)

## 2017-08-19 NOTE — Discharge Summary (Signed)
[facility] [place] NAME: Paul Pham    MR#:  [id number]  DATE OF BIRTH:  Aug 03, 1926  DATE OF ADMISSION:  08/17/2017   ADMITTING PHYSICIAN: Gladstone Lighter, MD  DATE OF DISCHARGE: 08/19/2017  1:00 PM  PRIMARY CARE PHYSICIAN: Patient, No Pcp Per   ADMISSION DIAGNOSIS:   low hemaglobin  DISCHARGE DIAGNOSIS:   Active Problems:   Anemia   Neutropenia ([place])   SECONDARY DIAGNOSIS:   Past Medical History:  Diagnosis Date  . BPH (benign prostatic hypertrophy)   . Diabetes type 2, controlled ([place])   . Hyperlipidemia   . Hypertension     HOSPITAL COURSE:   Paul Pham  is a 83 y.o. male with a known history of non-insulin-dependent diabetes mellitus, hyperlipidemia and BPH was sent in by his primary care physician secondary to anemia.  1. Acute symptomatic anemia- No active bleeding noted. -Received 2 units packed RBC transfusion this admission and hemoglobin improved from 6.7 to  8.7. -Last colonoscopy in 2010 showed only benign polyp. Stool for guaiac was negative as outpatient. Iron studies indicate normal eye levels, B12 and folate levels. -Hematology consult is appreciated. Hemolysis has been ruled out. -Further workup as outpatient looking for bone marrow disorders including a bone marrow biopsy. -Follow-up hemoglobin in 1 week with PCP - hold aspirin at discharge  2. Hypertension-continue lisinopril  3. Diabetes mellitus-on metformin   4. Hyperlipidemia on statin  Stable for discharge today Stable for discharge today   DISCHARGE CONDITIONS:   Guarded  CONSULTS OBTAINED:   Treatment Team:  Earlie Server, MD  DRUG ALLERGIES:   No Known Allergies DISCHARGE MEDICATIONS:   Allergies as of 08/19/2017   No Known Allergies     Medication List    STOP taking these medications   aspirin EC 81 MG tablet   azithromycin 250 MG tablet Commonly known as:  ZITHROMAX Z-PAK     TAKE these medications     finasteride 5 MG tablet Commonly known as:  PROSCAR Take 5 mg by mouth daily.   lisinopril 10 MG tablet Commonly known as:  PRINIVIL,ZESTRIL Take 10 mg by mouth daily.   metformin 1000 MG (OSM) 24 hr tablet Commonly known as:  FORTAMET Take 1,000 mg by mouth 2 (two) times daily with a meal.   simvastatin 40 MG tablet Commonly known as:  ZOCOR Take 40 mg by mouth daily.        DISCHARGE INSTRUCTIONS:   1. PCP follow-up in 1-2 weeks 2. Hemoglobin check in 1 week 3. Oncology follow up in 2 weeks  DIET:   Cardiac diet  ACTIVITY:   Activity as tolerated  OXYGEN:   Home Oxygen: No.  Oxygen Delivery: room air  DISCHARGE LOCATION:   home   If you experience worsening of your admission symptoms, develop shortness of breath, life threatening emergency, suicidal or homicidal thoughts you must seek medical attention immediately by calling 911 or calling your MD immediately  if symptoms less severe.  You Must read complete instructions/literature along with all the possible adverse reactions/side effects for all the Medicines you take and that have been prescribed to you. Take any new Medicines after you have completely understood and accpet all the possible adverse reactions/side effects.   Please note  You were cared for by a hospitalist during your hospital stay. If you have any questions about your discharge medications or the care you received while you were in the hospital after you are  discharged, you can call the unit and asked to speak with the hospitalist on call if the hospitalist that took care of you is not available. Once you are discharged, your primary care physician will handle any further medical issues. Please note that NO REFILLS for any discharge medications will be authorized once you are discharged, as it is imperative that you return to your primary care physician (or establish a relationship with a primary care physician if you do not have one) for your  aftercare needs so that they can reassess your need for medications and monitor your lab values.    On the day of Discharge:  VITAL SIGNS:   Blood pressure (!) 130/40, pulse 65, temperature (!) 97.5 F (36.4 C), temperature source Oral, resp. rate 18, height _0  (1.854 m), weight 84.6 kg (186 lb 8 oz), SpO2 99 %.  PHYSICAL EXAMINATION:    GENERAL:  83 y.o.-year-old elderly patient lying in the bed with no acute distress. Very hard of hearing EYES: Pupils equal, round, reactive to light and accommodation. No scleral icterus. Extraocular muscles intact.  HEENT: Head atraumatic, normocephalic. Oropharynx and nasopharynx clear.  NECK:  Supple, no jugular venous distention. No thyroid enlargement, no tenderness.  LUNGS: Normal breath sounds bilaterally, no wheezing, rales,rhonchi or crepitation. No use of accessory muscles of respiration.  CARDIOVASCULAR: S1, S2 normal. No murmurs, rubs, or gallops.  ABDOMEN: Soft, nontender, nondistended. Bowel sounds present. No organomegaly or mass.  EXTREMITIES: No pedal edema, cyanosis, or clubbing.  NEUROLOGIC: Cranial nerves II through XII are intact. Muscle strength 5/5 in all extremities. Sensation intact. Gait not checked.  PSYCHIATRIC: The patient is alert and oriented x 3.  SKIN: No obvious rash, lesion, or ulcer.     DATA REVIEW:   CBC Recent Labs  Lab 08/18/17 0213 08/19/17 0449  WBC 3.2*  --   HGB 8.0* 8.8*  HCT 24.8*  --   PLT 192  --     Chemistries  Recent Labs  Lab 08/17/17 2024 08/18/17 0213  NA 136 136  K 4.2 3.8  CL 103 103  CO2 25 26  GLUCOSE 230* 159*  BUN 26* 22*  CREATININE 0.77 0.58*  CALCIUM 8.6* 8.5*  AST 20  --   ALT 13*  --   ALKPHOS 29*  --   BILITOT 0.5  --      Microbiology Results  No results found for this or any previous visit.  RADIOLOGY:  No results found.   Management plans discussed with the patient, family and they are in agreement.  CODE STATUS:     Code Status Orders    (From admission, onward)        Start     Ordered   08/17/17 1935  Full code  Continuous     08/17/17 1935    Code Status History    Date Active Date Inactive Code Status Order ID Comments User Context   This patient has a current code status but no historical code status.    Advance Directive Documentation     Most Recent Value  Type of Advance Directive  Living will, Healthcare Power of Attorney  Pre-existing out of facility DNR order (yellow form or pink MOST form)  No data  "MOST" Form in Place?  No data      TOTAL TIME TAKING CARE OF THIS PATIENT: 37 minutes.    Gladstone Lighter M.D on 08/19/2017 at 2:43 PM  Between 7am to 6pm - Pager - [phone]  After 6pm go to [email or web address] - Proofreader  [facility]  Office  [phone]  CC: Primary care physician; Patient, No Pcp Per   Note: This dictation was prepared with Dragon dictation along with smaller phrase technology. Any transcriptional errors that result from this process are unintentional.

## 2017-08-19 NOTE — Progress Notes (Signed)
Discharge teaching given to patient, patient verbalized understanding and had no questions. Patient IV removed. Patient will be transported home by family. All patient belongings gathered prior to leaving.  

## 2017-08-19 NOTE — Care Management Important Message (Signed)
Important Message  Patient Details  Name: Paul Pham. MRN: [id number] Date of Birth: [birth year]   Medicare Important Message Given:  N/A - LOS <3 / Initial given by admissions    Beverly Sessions, RN 08/19/2017, 10:40 AM

## 2017-08-20 LAB — HAPTOGLOBIN: HAPTOGLOBIN: 116 mg/dL (ref 34–200)

## 2017-08-25 ENCOUNTER — Inpatient Hospital Stay: Payer: Medicare Other

## 2017-08-25 ENCOUNTER — Inpatient Hospital Stay: Payer: Medicare Other | Attending: Oncology | Admitting: Oncology

## 2017-08-25 ENCOUNTER — Other Ambulatory Visit: Payer: Self-pay

## 2017-08-25 ENCOUNTER — Encounter: Payer: Self-pay | Admitting: Oncology

## 2017-08-25 VITALS — BP 143/54 | HR 72 | Temp 98.3°F | Resp 18 | Ht 73.0 in | Wt 190.7 lb

## 2017-08-25 DIAGNOSIS — H9193 Unspecified hearing loss, bilateral: Secondary | ICD-10-CM | POA: Insufficient documentation

## 2017-08-25 DIAGNOSIS — D709 Neutropenia, unspecified: Secondary | ICD-10-CM | POA: Insufficient documentation

## 2017-08-25 DIAGNOSIS — Z79899 Other long term (current) drug therapy: Secondary | ICD-10-CM | POA: Insufficient documentation

## 2017-08-25 DIAGNOSIS — I10 Essential (primary) hypertension: Secondary | ICD-10-CM | POA: Insufficient documentation

## 2017-08-25 DIAGNOSIS — E119 Type 2 diabetes mellitus without complications: Secondary | ICD-10-CM | POA: Diagnosis not present

## 2017-08-25 DIAGNOSIS — D649 Anemia, unspecified: Secondary | ICD-10-CM

## 2017-08-25 DIAGNOSIS — E785 Hyperlipidemia, unspecified: Secondary | ICD-10-CM | POA: Insufficient documentation

## 2017-08-25 DIAGNOSIS — D708 Other neutropenia: Secondary | ICD-10-CM

## 2017-08-25 DIAGNOSIS — N400 Enlarged prostate without lower urinary tract symptoms: Secondary | ICD-10-CM | POA: Insufficient documentation

## 2017-08-25 LAB — CBC WITH DIFFERENTIAL/PLATELET
BASOS ABS: 0 10*3/uL (ref 0–0.1)
BASOS PCT: 0 %
EOS ABS: 0 10*3/uL (ref 0–0.7)
Eosinophils Relative: 0 %
HCT: 25.2 % — ABNORMAL LOW (ref 40.0–52.0)
HEMOGLOBIN: 8.1 g/dL — AB (ref 13.0–18.0)
Lymphocytes Relative: 22 %
Lymphs Abs: 1.1 10*3/uL (ref 1.0–3.6)
MCH: 29.8 pg (ref 26.0–34.0)
MCHC: 32.2 g/dL (ref 32.0–36.0)
MCV: 92.6 fL (ref 80.0–100.0)
MONO ABS: 0.6 10*3/uL (ref 0.2–1.0)
MONOS PCT: 12 %
NEUTROS ABS: 3.2 10*3/uL (ref 1.4–6.5)
NEUTROS PCT: 66 %
Platelets: 202 10*3/uL (ref 150–440)
RBC: 2.73 MIL/uL — ABNORMAL LOW (ref 4.40–5.90)
RDW: 20.6 % — AB (ref 11.5–14.5)
WBC: 4.8 10*3/uL (ref 3.8–10.6)

## 2017-08-25 LAB — SAMPLE TO BLOOD BANK

## 2017-08-25 NOTE — Progress Notes (Signed)
Hematology/Oncology Follow Up Note [facility]  Telephone:([phone] [phone]  Patient Care Team: Tracie Harrier, MD as PCP - General (Internal Medicine)   Name of the patient: Brance Dartt  [id number]  08-20-1926   REASON FOR VISIT  follow-up for management of anemia. HISTORY OF PRESENT ILLNESS Dillan Candela. is a  83 y.o.  male with PMH listed below who was present as a follow-up after hospital admission for management of anemia. Patient was seen during his most recent admission on 08/18/2017. Patient is heart hearing. He has been feeling increased fatigue, dyspnea, and hemoglobin has dropped from baseline 10 to 7 within the past couple of months. Last colonoscopy was done 10 years ago. Outpatient stool occult test is negative. Patient also takes iron pills and stool is always black. Patient had anemia workup done during hospitalization which showed normal B12, folate, iron panel. Hemolysis panel was negative as well. Patient was given PRBC transfusion with symptom improvement and was released home.  Patient present for follow-up. His wife and 2 sons accompanied him to today's visit. Patient reports feeling well today. He is able to do some outdoor chore at home. Denies any shortness of breath. He denies seeing blood in the stool.   Review of systems Review of Systems  Constitutional: Negative for chills, fever and weight loss.  HENT: Negative for hearing loss.   Eyes: Negative for blurred vision.  Respiratory: Negative for cough and shortness of breath.   Cardiovascular: Negative for chest pain and leg swelling.  Gastrointestinal: Negative for nausea and vomiting.  Genitourinary: Negative for dysuria.  Musculoskeletal: Negative for myalgias.  Skin: Negative for rash.  Neurological: Negative for dizziness.  Endo/Heme/Allergies: Does not bruise/bleed easily.  Psychiatric/Behavioral: Negative for depression.    No Known  Allergies   Past Medical History:  Diagnosis Date  . BPH (benign prostatic hypertrophy)   . Diabetes type 2, controlled ([employer])   . HOH (hard of hearing)    r and L ears-70% loss per pt  . Hyperlipidemia   . Hypertension      Past Surgical History:  Procedure Laterality Date  . APPENDECTOMY    . COLONOSCOPY  09/21/08   1 polyp found, tubular adenoma  . HAND SURGERY    . KNEE SURGERY Left     Social History   Socioeconomic History  . Marital status: Married    Spouse name: Not on file  . Number of children: Not on file  . Years of education: Not on file  . Highest education level: Not on file  Social Needs  . Financial resource strain: Not on file  . Food insecurity - worry: Not on file  . Food insecurity - inability: Not on file  . Transportation needs - medical: Not on file  . Transportation needs - non-medical: Not on file  Occupational History  . Not on file  Tobacco Use  . Smoking status: Never Smoker  . Smokeless tobacco: Never Used  Substance and Sexual Activity  . Alcohol use: No    Alcohol/week: 0.0 oz  . Drug use: No  . Sexual activity: Not on file  Other Topics Concern  . Not on file  Social History Narrative  . Not on file    Family History  Problem Relation Age of Onset  . Aneurysm Mother   . Heart disease Father   . Cancer Brother        skin  . Heart disease Brother   .  Heart disease Brother   . Cancer Sister        skin  . Aneurysm Brother   . Heart disease Brother   . Heart disease Sister   . Heart disease Sister   . COPD Sister   . Diabetes Sister      Current Outpatient Medications:  .  aspirin EC 81 MG tablet, Take by mouth., Disp: , Rfl:  .  finasteride (PROSCAR) 5 MG tablet, Take 5 mg by mouth daily., Disp: , Rfl:  .  glucose blood (PRECISION QID TEST) test strip, Use 3 (three) times daily. Use as instructed., Disp: , Rfl:  .  lisinopril (PRINIVIL,ZESTRIL) 10 MG tablet, Take 10 mg by mouth daily., Disp: , Rfl:  .   metformin (FORTAMET) 1000 MG (OSM) 24 hr tablet, Take 1,000 mg by mouth 2 (two) times daily with a meal., Disp: , Rfl:  .  simvastatin (ZOCOR) 40 MG tablet, Take 40 mg by mouth daily., Disp: , Rfl:  .  Omega-3 Fatty Acids (FISH OIL PO), Take by mouth., Disp: , Rfl:   Physical exam:  Vitals:   08/25/17 1035  BP: (!) 143/54  Pulse: 72  Resp: 18  Temp: 98.3 F (36.8 C)  TempSrc: Oral  Weight: 190 lb 11.2 oz (86.5 kg)  Height: _0  (1.854 m)   Physical Exam  CMP Latest Ref Rng & Units 08/18/2017  Glucose 65 - 99 mg/dL 159(H)  BUN 6 - 20 mg/dL 22(H)  Creatinine 0.61 - 1.24 mg/dL 0.58(L)  Sodium 135 - 145 mmol/L 136  Potassium 3.5 - 5.1 mmol/L 3.8  Chloride 101 - 111 mmol/L 103  CO2 22 - 32 mmol/L 26  Calcium 8.9 - 10.3 mg/dL 8.5(L)  Total Protein 6.5 - 8.1 g/dL -  Total Bilirubin 0.3 - 1.2 mg/dL -  Alkaline Phos 38 - 126 U/L -  AST 15 - 41 U/L -  ALT 17 - 63 U/L -   CBC Latest Ref Rng & Units 08/25/2017  WBC 3.8 - 10.6 K/uL 4.8  Hemoglobin 13.0 - 18.0 g/dL 8.1(L)  Hematocrit 40.0 - 52.0 % 25.2(L)  Platelets 150 - 440 K/uL 202    Assessment and plan Patient is a 83 y.o. male presents with symptomatic anemia.   1 normocytic anemia: s/p PRBC transfusion. Hemoglobin slightly trended down. Patient's workup including stool occult was negative. UA negative for hematuria. Normal J33 and folic levels. normal TSH. Negative hemolysis workup Inappropriate normal reticulocyte count. 2 Leukopenia, Added differential to cbc, which showed mild neutropenia.  .  Discussed with patient that I will start with some basic workup including flow cytometry, multiple myeloma workup. I may also want to have bone marrow biopsy done depending on the results I obtained from the initial workup.  Discussed patient's case with Dr. Ginette Pitman with patient's PCP. We may want to have a CT scan done if no obvious etiology was found for his anemia.Follow-up in 10 days to discuss labs.Earlie Server, MD,  PhD Hematology Oncology [facility] Pager- [phone] 08/25/2017

## 2017-08-25 NOTE — Progress Notes (Signed)
Here for new pt evaluation. Stated he was d/c from the hospital recently for low H/H. Feeling better he stated. Pt is very HOH (as is wife w him)

## 2017-08-26 LAB — PROTEIN ELECTROPHORESIS, SERUM
A/G Ratio: 1.4 (ref 0.7–1.7)
ALBUMIN ELP: 3.8 g/dL (ref 2.9–4.4)
ALPHA-1-GLOBULIN: 0.2 g/dL (ref 0.0–0.4)
Alpha-2-Globulin: 0.7 g/dL (ref 0.4–1.0)
Beta Globulin: 1 g/dL (ref 0.7–1.3)
GAMMA GLOBULIN: 0.9 g/dL (ref 0.4–1.8)
GLOBULIN, TOTAL: 2.7 g/dL (ref 2.2–3.9)
TOTAL PROTEIN ELP: 6.5 g/dL (ref 6.0–8.5)

## 2017-08-26 LAB — KAPPA/LAMBDA LIGHT CHAINS
KAPPA, LAMDA LIGHT CHAIN RATIO: 1.23 (ref 0.26–1.65)
Kappa free light chain: 17 mg/L (ref 3.3–19.4)
Lambda free light chains: 13.8 mg/L (ref 5.7–26.3)

## 2017-08-27 LAB — IMMUNOFIXATION ELECTROPHORESIS
IGA: 279 mg/dL (ref 61–437)
IGM (IMMUNOGLOBULIN M), SRM: 80 mg/dL (ref 15–143)
IgG (Immunoglobin G), Serum: 876 mg/dL (ref 700–1600)
TOTAL PROTEIN ELP: 6.4 g/dL (ref 6.0–8.5)

## 2017-08-30 LAB — COMP PANEL: LEUKEMIA/LYMPHOMA

## 2017-08-31 DIAGNOSIS — D469 Myelodysplastic syndrome, unspecified: Secondary | ICD-10-CM

## 2017-08-31 HISTORY — DX: Myelodysplastic syndrome, unspecified: D46.9

## 2017-09-05 NOTE — Progress Notes (Addendum)
Hematology/Oncology Follow Up Note [facility]  [phone] [phone]  Patient Care Team: Tracie Harrier, MD as PCP - General (Internal Medicine)   Name of the patient: Paul Pham  [id number]  1926-05-18   REASON FOR VISIT  follow-up for management of anemia. HISTORY OF PRESENT ILLNESS Paul Goede. is a  84 y.o.  male with PMH listed below who was present as a follow-up after hospital admission for management of anemia. Patient was seen during his most recent admission on 08/18/2017. Patient is heart hearing. He has been feeling increased fatigue, dyspnea, and hemoglobin has dropped from baseline 10 to 7 within the past couple of months. Last colonoscopy was done 10 years ago. Outpatient stool occult test is negative. Patient also takes iron pills and stool is always black. Patient had anemia workup done during hospitalization which showed normal B12, folate, iron panel. Hemolysis panel was negative as well. Patient was given PRBC transfusion with symptom improvement and was released home.  Patient present for follow-up. His wife and 2 sons accompanied him to today's visit. Patient reports feeling well today. He is able to do some outdoor chore at home. Denies any shortness of breath. He denies seeing blood in the stool.  INTERVAL HISTORY Paul Thal. is a 84 y.o. male who has above history reviewed by me today presents for follow up visit for management of anemia.  Patient reports actually feeling better. He used to have shortness of breath with exertion and today he reports his actually feeling less shortness of breath with walking. He is accompanied by his wife, and 2 sons. Appetite is good. Denies any blood in the stool. Review of systems Review of Systems  Constitutional: Negative for chills, fever, malaise/fatigue and weight loss.  HENT: Negative for hearing loss and tinnitus.   Eyes: Negative for blurred vision, pain and  redness.  Respiratory: Negative for cough and shortness of breath.   Cardiovascular: Negative for chest pain, palpitations and leg swelling.  Gastrointestinal: Negative for constipation, diarrhea, nausea and vomiting.  Genitourinary: Negative for dysuria and urgency.  Musculoskeletal: Negative for myalgias.  Skin: Negative for rash.  Neurological: Negative for dizziness and headaches.  Endo/Heme/Allergies: Negative for environmental allergies. Does not bruise/bleed easily.  Psychiatric/Behavioral: Negative for depression and suicidal ideas.    No Known Allergies   Past Medical History:  Diagnosis Date  . BPH (benign prostatic hypertrophy)   . Diabetes type 2, controlled ([place])   . HOH (hard of hearing)    r and L ears-70% loss per pt  . Hyperlipidemia   . Hypertension      Past Surgical History:  Procedure Laterality Date  . APPENDECTOMY    . COLONOSCOPY  09/21/08   1 polyp found, tubular adenoma  . HAND SURGERY    . KNEE SURGERY Left     Social History   Socioeconomic History  . Marital status: Married    Spouse name: Not on file  . Number of children: Not on file  . Years of education: Not on file  . Highest education level: Not on file  Social Needs  . Financial resource strain: Not on file  . Food insecurity - worry: Not on file  . Food insecurity - inability: Not on file  . Transportation needs - medical: Not on file  . Transportation needs - non-medical: Not on file  Occupational History  . Not on file  Tobacco Use  . Smoking status: Never Smoker  .  Smokeless tobacco: Never Used  Substance and Sexual Activity  . Alcohol use: No    Alcohol/week: 0.0 oz  . Drug use: No  . Sexual activity: Not on file  Other Topics Concern  . Not on file  Social History Narrative  . Not on file    Family History  Problem Relation Age of Onset  . Aneurysm Mother   . Heart disease Father   . Cancer Brother        skin  . Heart disease Brother   . Heart disease  Brother   . Cancer Sister        skin  . Aneurysm Brother   . Heart disease Brother   . Heart disease Sister   . Heart disease Sister   . COPD Sister   . Diabetes Sister      Current Outpatient Medications:  .  aspirin EC 81 MG tablet, Take by mouth., Disp: , Rfl:  .  finasteride (PROSCAR) 5 MG tablet, Take 5 mg by mouth daily., Disp: , Rfl:  .  glucose blood (PRECISION QID TEST) test strip, Use 3 (three) times daily. Use as instructed., Disp: , Rfl:  .  lisinopril (PRINIVIL,ZESTRIL) 10 MG tablet, Take 10 mg by mouth daily., Disp: , Rfl:  .  metformin (FORTAMET) 1000 MG (OSM) 24 hr tablet, Take 1,000 mg by mouth 2 (two) times daily with a meal., Disp: , Rfl:  .  Omega-3 Fatty Acids (FISH OIL PO), Take by mouth., Disp: , Rfl:  .  simvastatin (ZOCOR) 40 MG tablet, Take 40 mg by mouth daily., Disp: , Rfl:   Physical exam:  Vitals:   09/06/17 1319 09/06/17 1323  BP:  140/63  Pulse:  88  Resp: 16   Temp:  98.2 F (36.8 C)  TempSrc:  Oral  Weight: 189 lb 3.2 oz (85.8 kg)   Height: '6\' 1"'  (1.854 m)    Physical Exam  Constitutional: He is oriented to person, place, and time. No distress.  HENT:  Head: Normocephalic and atraumatic.  Mouth/Throat: No oropharyngeal exudate.  Eyes: Conjunctivae and EOM are normal. Pupils are equal, round, and reactive to light.  Neck: Normal range of motion. Neck supple.  Cardiovascular: Normal rate and regular rhythm.  No murmur heard. Pulmonary/Chest: Effort normal and breath sounds normal. No respiratory distress.  Abdominal: Soft. Bowel sounds are normal. He exhibits no distension.  Musculoskeletal: Normal range of motion. He exhibits no edema or deformity.  Lymphadenopathy:    He has no cervical adenopathy.  Neurological: He is oriented to person, place, and time.  Skin: Skin is warm and dry.  Psychiatric: Affect normal.    CMP Latest Ref Rng & Units 08/18/2017  Glucose 65 - 99 mg/dL 159(H)  BUN 6 - 20 mg/dL 22(H)  Creatinine 0.61 -  1.24 mg/dL 0.58(L)  Sodium 135 - 145 mmol/L 136  Potassium 3.5 - 5.1 mmol/L 3.8  Chloride 101 - 111 mmol/L 103  CO2 22 - 32 mmol/L 26  Calcium 8.9 - 10.3 mg/dL 8.5(L)  Total Protein 6.5 - 8.1 g/dL -  Total Bilirubin 0.3 - 1.2 mg/dL -  Alkaline Phos 38 - 126 U/L -  AST 15 - 41 U/L -  ALT 17 - 63 U/L -   CBC Latest Ref Rng & Units 08/25/2017  WBC 3.8 - 10.6 K/uL 4.8  Hemoglobin 13.0 - 18.0 g/dL 8.1(L)  Hematocrit 40.0 - 52.0 % 25.2(L)  Platelets 150 - 440 K/uL 202   Patient's workup including  stool occult was negative. UA negative for hematuria. Normal W69 and folic levels. normal TSH. Negative hemolysis workup Inappropriate normal reticulocyte count.   Assessment and plan Patient is a 84 y.o. male presents with symptomatic anemia.  1. Anemia, unspecified type   2. Enlarged prostate    1 normocytic anemia: s/p PRBC transfusion. Hemoglobin slightly trended down. Inappropriately low reticulocyte count indicating decreased production the marrow. Repeat CBC today. Discussed with patient and his family members that bone marrow biopsy may need to breakdown however given patient's age, If he is reluctant to have the procedure done is reasonable. We can also monitor his counts and give supportive care. 2 Leukopenia, as resolved.  3 enlarged prostate: Patient reports that he's been following up with urologist. Burnis Medin check PSA today.  We may want to have a CT scan done if no obvious etiology was found for his anemia.  Follow-up in 2 weeks. With CBC.  Earlie Server, MD, PhD Hematology Oncology [facility] [place]- [phone] 09/05/2017   Addendum: patient/family called and ask when will bone marrow biopsy be scheduled. Will schedule BM biopsy.

## 2017-09-06 ENCOUNTER — Other Ambulatory Visit: Payer: Self-pay

## 2017-09-06 ENCOUNTER — Encounter: Payer: Self-pay | Admitting: Oncology

## 2017-09-06 ENCOUNTER — Inpatient Hospital Stay: Payer: Medicare Other

## 2017-09-06 ENCOUNTER — Inpatient Hospital Stay: Payer: Medicare Other | Attending: Oncology | Admitting: Oncology

## 2017-09-06 VITALS — BP 140/63 | HR 88 | Temp 98.2°F | Resp 16 | Ht 73.0 in | Wt 189.2 lb

## 2017-09-06 DIAGNOSIS — N400 Enlarged prostate without lower urinary tract symptoms: Secondary | ICD-10-CM

## 2017-09-06 DIAGNOSIS — H9193 Unspecified hearing loss, bilateral: Secondary | ICD-10-CM | POA: Diagnosis not present

## 2017-09-06 DIAGNOSIS — D649 Anemia, unspecified: Secondary | ICD-10-CM

## 2017-09-06 DIAGNOSIS — I10 Essential (primary) hypertension: Secondary | ICD-10-CM | POA: Diagnosis not present

## 2017-09-06 DIAGNOSIS — E785 Hyperlipidemia, unspecified: Secondary | ICD-10-CM | POA: Insufficient documentation

## 2017-09-06 DIAGNOSIS — E119 Type 2 diabetes mellitus without complications: Secondary | ICD-10-CM | POA: Insufficient documentation

## 2017-09-06 DIAGNOSIS — R5383 Other fatigue: Secondary | ICD-10-CM

## 2017-09-06 LAB — CBC
HEMATOCRIT: 23.8 % — AB (ref 40.0–52.0)
Hemoglobin: 7.6 g/dL — ABNORMAL LOW (ref 13.0–18.0)
MCH: 29.3 pg (ref 26.0–34.0)
MCHC: 32.1 g/dL (ref 32.0–36.0)
MCV: 91.5 fL (ref 80.0–100.0)
Platelets: 215 10*3/uL (ref 150–440)
RBC: 2.61 MIL/uL — ABNORMAL LOW (ref 4.40–5.90)
RDW: 20.3 % — AB (ref 11.5–14.5)
WBC: 6.1 10*3/uL (ref 3.8–10.6)

## 2017-09-06 LAB — PSA: Prostatic Specific Antigen: 0.95 ng/mL (ref 0.00–4.00)

## 2017-09-06 NOTE — Progress Notes (Signed)
Patient here for follow up, Denies dyspnea, chills, lack of energy.

## 2017-09-08 NOTE — Addendum Note (Signed)
Addended by: Earlie Server on: 09/08/2017 10:11 PM   Modules accepted: Orders

## 2017-09-09 DIAGNOSIS — D649 Anemia, unspecified: Secondary | ICD-10-CM | POA: Diagnosis not present

## 2017-09-10 ENCOUNTER — Other Ambulatory Visit: Payer: Self-pay | Admitting: *Deleted

## 2017-09-10 DIAGNOSIS — D708 Other neutropenia: Secondary | ICD-10-CM

## 2017-09-10 DIAGNOSIS — D649 Anemia, unspecified: Secondary | ICD-10-CM | POA: Diagnosis not present

## 2017-09-12 DIAGNOSIS — D649 Anemia, unspecified: Secondary | ICD-10-CM | POA: Diagnosis not present

## 2017-09-13 ENCOUNTER — Other Ambulatory Visit: Payer: Self-pay

## 2017-09-13 DIAGNOSIS — D649 Anemia, unspecified: Secondary | ICD-10-CM

## 2017-09-13 LAB — OCCULT BLOOD X 1 CARD TO LAB, STOOL
FECAL OCCULT BLD: NEGATIVE
Fecal Occult Bld: NEGATIVE
Fecal Occult Bld: NEGATIVE

## 2017-09-16 ENCOUNTER — Other Ambulatory Visit: Payer: Self-pay | Admitting: General Surgery

## 2017-09-16 ENCOUNTER — Ambulatory Visit
Admission: RE | Admit: 2017-09-16 | Discharge: 2017-09-16 | Disposition: A | Discharge status: Home / Self Care | Payer: Medicare Other | Source: Ambulatory Visit | Attending: Oncology | Admitting: Oncology

## 2017-09-16 DIAGNOSIS — K8020 Calculus of gallbladder without cholecystitis without obstruction: Secondary | ICD-10-CM | POA: Diagnosis not present

## 2017-09-16 DIAGNOSIS — K449 Diaphragmatic hernia without obstruction or gangrene: Secondary | ICD-10-CM | POA: Diagnosis not present

## 2017-09-16 DIAGNOSIS — D649 Anemia, unspecified: Secondary | ICD-10-CM | POA: Diagnosis not present

## 2017-09-16 DIAGNOSIS — K439 Ventral hernia without obstruction or gangrene: Secondary | ICD-10-CM | POA: Diagnosis not present

## 2017-09-16 DIAGNOSIS — R5383 Other fatigue: Secondary | ICD-10-CM | POA: Diagnosis not present

## 2017-09-16 DIAGNOSIS — N400 Enlarged prostate without lower urinary tract symptoms: Secondary | ICD-10-CM | POA: Insufficient documentation

## 2017-09-20 NOTE — Progress Notes (Signed)
Hematology/Oncology Follow Up Note [facility]  [phone] [phone]  Patient Care Team: Tracie Harrier, MD as PCP - General (Internal Medicine)   Name of the patient: Paul Pham  [id number]  [birth year]   REASON FOR VISIT  follow-up for management of anemia. HISTORY OF PRESENT ILLNESS Paul Pham. is a  84 y.o.  male with PMH listed below who was present as a follow-up after hospital admission for management of anemia. Patient was seen during his most recent admission on 08/18/2017. Patient is heart hearing. He has been feeling increased fatigue, dyspnea, and hemoglobin has dropped from baseline 10 to 7 within the past couple of months. Last colonoscopy was done 10 years ago. Outpatient stool occult test is negative. Patient also takes iron pills and stool is always black. Patient had anemia workup done during hospitalization which showed normal B12, folate, iron panel. Hemolysis panel was negative as well. Patient was given PRBC transfusion with symptom improvement and was released home.  Patient present for follow-up. His wife and 2 sons accompanied him to today's visit. Patient reports feeling well today. He is able to do some outdoor chore at home. Denies any shortness of breath. He denies seeing blood in the stool.  INTERVAL HISTORY Paul Pham. is a 84 y.o. male who has above history reviewed by me today presents for follow up visit for management of anemia. Patient reports feeling the same at his baseline. He denies more fatigue or shortness of breath. He is accomplished by his wife and son. He remembers how he felt when his hemoglobin was at 6.7 when he had to go to the hospital. He definitely feels a lot better compared at that time. Appetite is good. Denies any blood in the stool. Review of systems Review of Systems  Constitutional: Negative for chills, fever, malaise/fatigue and weight loss.  HENT: Negative for ear  discharge, hearing loss and tinnitus.   Eyes: Negative for blurred vision, pain and redness.  Respiratory: Negative for cough, hemoptysis and shortness of breath.   Cardiovascular: Negative for chest pain, palpitations and leg swelling.  Gastrointestinal: Negative for constipation, diarrhea, nausea and vomiting.  Genitourinary: Negative for dysuria and urgency.  Musculoskeletal: Negative for myalgias and neck pain.  Skin: Negative for rash.  Neurological: Negative for dizziness and headaches.  Endo/Heme/Allergies: Negative for environmental allergies. Does not bruise/bleed easily.  Psychiatric/Behavioral: Negative for depression and suicidal ideas.    No Known Allergies   Past Medical History:  Diagnosis Date  . BPH (benign prostatic hypertrophy)   . Diabetes type 2, controlled ([place])   . HOH (hard of hearing)    r and L ears-70% loss per pt  . Hyperlipidemia   . Hypertension      Past Surgical History:  Procedure Laterality Date  . APPENDECTOMY    . COLONOSCOPY  09/21/08   1 polyp found, tubular adenoma  . HAND SURGERY    . KNEE SURGERY Left     Social History   Socioeconomic History  . Marital status: Married    Spouse name: Not on file  . Number of children: Not on file  . Years of education: Not on file  . Highest education level: Not on file  Social Needs  . Financial resource strain: Not on file  . Food insecurity - worry: Not on file  . Food insecurity - inability: Not on file  . Transportation needs - medical: Not on file  . Transportation needs - non-medical:  Not on file  Occupational History  . Not on file  Tobacco Use  . Smoking status: Never Smoker  . Smokeless tobacco: Never Used  Substance and Sexual Activity  . Alcohol use: No    Alcohol/week: 0.0 oz  . Drug use: No  . Sexual activity: Not on file  Other Topics Concern  . Not on file  Social History Narrative  . Not on file    Family History  Problem Relation Age of Onset  . Aneurysm  Mother   . Heart disease Father   . Cancer Brother        skin  . Heart disease Brother   . Heart disease Brother   . Cancer Sister        skin  . Aneurysm Brother   . Heart disease Brother   . Heart disease Sister   . Heart disease Sister   . COPD Sister   . Diabetes Sister      Current Outpatient Medications:  .  finasteride (PROSCAR) 5 MG tablet, Take 5 mg by mouth daily., Disp: , Rfl:  .  glucose blood (PRECISION QID TEST) test strip, Use 3 (three) times daily. Use as instructed., Disp: , Rfl:  .  lisinopril (PRINIVIL,ZESTRIL) 10 MG tablet, Take 10 mg by mouth daily., Disp: , Rfl:  .  metformin (FORTAMET) 1000 MG (OSM) 24 hr tablet, Take 1,000 mg by mouth 2 (two) times daily with a meal., Disp: , Rfl:  .  Omega-3 Fatty Acids (FISH OIL PO), Take by mouth., Disp: , Rfl:  .  simvastatin (ZOCOR) 40 MG tablet, Take 40 mg by mouth daily., Disp: , Rfl:  .  aspirin EC 81 MG tablet, Take 81 mg by mouth daily. , Disp: , Rfl:   Physical exam:  Vitals:   09/21/17 1145 09/21/17 1206  BP: (!) 156/64   Pulse: 84   Resp: 20   Temp: 97.9 F (36.6 C)   TempSrc: Tympanic   Weight:  187 lb (84.8 kg)  Height:  '6\' 1"'$  (1.854 m)   Physical Exam  Constitutional: He is oriented to person, place, and time. No distress.  HENT:  Head: Normocephalic and atraumatic.  Mouth/Throat: No oropharyngeal exudate.  Eyes: Conjunctivae and EOM are normal. Pupils are equal, round, and reactive to light. Right eye exhibits no discharge. No scleral icterus.  Neck: Normal range of motion. Neck supple.  Cardiovascular: Normal rate and regular rhythm.  No murmur heard. Pulmonary/Chest: Effort normal and breath sounds normal. No respiratory distress.  Abdominal: Soft. Bowel sounds are normal. He exhibits no distension and no mass.  Musculoskeletal: Normal range of motion. He exhibits no edema or deformity.  Lymphadenopathy:    He has no cervical adenopathy.  Neurological: He is oriented to person, place, and  time. No cranial nerve deficit.  Skin: Skin is warm and dry. There is pallor.  Psychiatric: Affect and judgment normal.    CMP Latest Ref Rng & Units 08/18/2017  Glucose 65 - 99 mg/dL 159(H)  BUN 6 - 20 mg/dL 22(H)  Creatinine 0.61 - 1.24 mg/dL 0.58(L)  Sodium 135 - 145 mmol/L 136  Potassium 3.5 - 5.1 mmol/L 3.8  Chloride 101 - 111 mmol/L 103  CO2 22 - 32 mmol/L 26  Calcium 8.9 - 10.3 mg/dL 8.5(L)  Total Protein 6.5 - 8.1 g/dL -  Total Bilirubin 0.3 - 1.2 mg/dL -  Alkaline Phos 38 - 126 U/L -  AST 15 - 41 U/L -  ALT 17 -  63 U/L -   CBC Latest Ref Rng & Units 09/06/2017  WBC 3.8 - 10.6 K/uL 6.1  Hemoglobin 13.0 - 18.0 g/dL 7.6(L)  Hematocrit 40.0 - 52.0 % 23.8(L)  Platelets 150 - 440 K/uL 215   Patient's workup including stool occult was negative. UA negative for hematuria. Normal Y51 and folic levels. normal TSH. Negative hemolysis workup Inappropriate normal reticulocyte count.   Assessment and plan Patient is a 84 y.o. male presents with symptomatic anemia.  1. Anemia, unspecified type    1 normocytic anemia patient's hemoglobin improved initially after one PRBC infusion during hospitalization, and gradually decreases.  He has had extensive workup including urinalysis negative for hematuria, normal G33 and folic level. Normal TSH, negative hemolysis workup. Her stool occult  is being negative as well. CT abdomen pelvis reveals no acute process. Discussed with patient and family and they agree with bone marrow biopsy to evaluation further.  2 Leukopenia, as resolved.  3 enlarged prostate: Patient reports that he's been following up with urologist. PSA is normal.  Follow-up in 2 weeks. With CBC.  Earlie Server, MD, PhD Hematology Oncology [facility] Pager- [phone] 09/21/2017

## 2017-09-21 ENCOUNTER — Inpatient Hospital Stay: Payer: Medicare Other | Admitting: *Deleted

## 2017-09-21 ENCOUNTER — Other Ambulatory Visit: Payer: Self-pay | Admitting: Student

## 2017-09-21 ENCOUNTER — Other Ambulatory Visit: Payer: Self-pay

## 2017-09-21 ENCOUNTER — Encounter: Payer: Self-pay | Admitting: Oncology

## 2017-09-21 ENCOUNTER — Inpatient Hospital Stay (HOSPITAL_BASED_OUTPATIENT_CLINIC_OR_DEPARTMENT_OTHER): Payer: Medicare Other | Admitting: Oncology

## 2017-09-21 VITALS — BP 156/64 | HR 84 | Temp 97.9°F | Resp 20 | Ht 73.0 in | Wt 187.0 lb

## 2017-09-21 DIAGNOSIS — D649 Anemia, unspecified: Secondary | ICD-10-CM | POA: Diagnosis not present

## 2017-09-21 DIAGNOSIS — N400 Enlarged prostate without lower urinary tract symptoms: Secondary | ICD-10-CM

## 2017-09-21 DIAGNOSIS — D708 Other neutropenia: Secondary | ICD-10-CM

## 2017-09-21 LAB — URINALYSIS, COMPLETE (UACMP) WITH MICROSCOPIC
BILIRUBIN URINE: NEGATIVE
Glucose, UA: NEGATIVE mg/dL
Hgb urine dipstick: NEGATIVE
Ketones, ur: NEGATIVE mg/dL
LEUKOCYTES UA: NEGATIVE
Nitrite: NEGATIVE
Protein, ur: NEGATIVE mg/dL
SPECIFIC GRAVITY, URINE: 1.015 (ref 1.005–1.030)
pH: 5 (ref 5.0–8.0)

## 2017-09-21 LAB — CBC WITH DIFFERENTIAL/PLATELET
BASOS PCT: 0 %
Basophils Absolute: 0 10*3/uL (ref 0–0.1)
EOS ABS: 0 10*3/uL (ref 0–0.7)
EOS PCT: 0 %
HCT: 22.9 % — ABNORMAL LOW (ref 40.0–52.0)
Hemoglobin: 7.2 g/dL — ABNORMAL LOW (ref 13.0–18.0)
Lymphocytes Relative: 31 %
Lymphs Abs: 1.2 10*3/uL (ref 1.0–3.6)
MCH: 28.8 pg (ref 26.0–34.0)
MCHC: 31.3 g/dL — AB (ref 32.0–36.0)
MCV: 92.1 fL (ref 80.0–100.0)
MONOS PCT: 15 %
Monocytes Absolute: 0.6 10*3/uL (ref 0.2–1.0)
NEUTROS PCT: 54 %
Neutro Abs: 2.2 10*3/uL (ref 1.4–6.5)
PLATELETS: 247 10*3/uL (ref 150–440)
RBC: 2.49 MIL/uL — AB (ref 4.40–5.90)
RDW: 20.5 % — ABNORMAL HIGH (ref 11.5–14.5)
WBC: 4 10*3/uL (ref 3.8–10.6)

## 2017-09-21 LAB — SAMPLE TO BLOOD BANK

## 2017-09-22 ENCOUNTER — Other Ambulatory Visit (HOSPITAL_COMMUNITY)
Admission: RE | Admit: 2017-09-22 | Disposition: A | Discharge status: Home / Self Care | Payer: Medicare Other | Source: Ambulatory Visit | Attending: Oncology | Admitting: Oncology

## 2017-09-22 ENCOUNTER — Ambulatory Visit
Admission: RE | Admit: 2017-09-22 | Discharge: 2017-09-22 | Disposition: A | Discharge status: Home / Self Care | Payer: Medicare Other | Source: Ambulatory Visit | Attending: Oncology | Admitting: Oncology

## 2017-09-22 DIAGNOSIS — Z833 Family history of diabetes mellitus: Secondary | ICD-10-CM | POA: Diagnosis not present

## 2017-09-22 DIAGNOSIS — Z7984 Long term (current) use of oral hypoglycemic drugs: Secondary | ICD-10-CM | POA: Diagnosis not present

## 2017-09-22 DIAGNOSIS — I10 Essential (primary) hypertension: Secondary | ICD-10-CM | POA: Diagnosis not present

## 2017-09-22 DIAGNOSIS — N400 Enlarged prostate without lower urinary tract symptoms: Secondary | ICD-10-CM | POA: Insufficient documentation

## 2017-09-22 DIAGNOSIS — D649 Anemia, unspecified: Secondary | ICD-10-CM

## 2017-09-22 DIAGNOSIS — H9193 Unspecified hearing loss, bilateral: Secondary | ICD-10-CM | POA: Diagnosis not present

## 2017-09-22 DIAGNOSIS — Z7982 Long term (current) use of aspirin: Secondary | ICD-10-CM | POA: Diagnosis not present

## 2017-09-22 DIAGNOSIS — Z8249 Family history of ischemic heart disease and other diseases of the circulatory system: Secondary | ICD-10-CM | POA: Diagnosis not present

## 2017-09-22 DIAGNOSIS — E119 Type 2 diabetes mellitus without complications: Secondary | ICD-10-CM | POA: Diagnosis not present

## 2017-09-22 DIAGNOSIS — D509 Iron deficiency anemia, unspecified: Secondary | ICD-10-CM | POA: Diagnosis not present

## 2017-09-22 DIAGNOSIS — Z79899 Other long term (current) drug therapy: Secondary | ICD-10-CM | POA: Insufficient documentation

## 2017-09-22 DIAGNOSIS — Z825 Family history of asthma and other chronic lower respiratory diseases: Secondary | ICD-10-CM | POA: Diagnosis not present

## 2017-09-22 DIAGNOSIS — E785 Hyperlipidemia, unspecified: Secondary | ICD-10-CM | POA: Insufficient documentation

## 2017-09-22 DIAGNOSIS — D72819 Decreased white blood cell count, unspecified: Secondary | ICD-10-CM | POA: Diagnosis not present

## 2017-09-22 HISTORY — DX: Other complications of anesthesia, initial encounter: T88.59XA

## 2017-09-22 HISTORY — DX: Adverse effect of unspecified anesthetic, initial encounter: T41.45XA

## 2017-09-22 HISTORY — DX: Unspecified osteoarthritis, unspecified site: M19.90

## 2017-09-22 HISTORY — DX: Anemia, unspecified: D64.9

## 2017-09-22 LAB — CBC WITH DIFFERENTIAL/PLATELET
BASOS ABS: 0 10*3/uL (ref 0–0.1)
BASOS PCT: 0 %
Eosinophils Absolute: 0 10*3/uL (ref 0–0.7)
Eosinophils Relative: 0 %
HEMATOCRIT: 25.2 % — AB (ref 40.0–52.0)
Hemoglobin: 7.8 g/dL — ABNORMAL LOW (ref 13.0–18.0)
Lymphocytes Relative: 35 %
Lymphs Abs: 1.1 10*3/uL (ref 1.0–3.6)
MCH: 28.2 pg (ref 26.0–34.0)
MCHC: 30.9 g/dL — ABNORMAL LOW (ref 32.0–36.0)
MCV: 91.2 fL (ref 80.0–100.0)
Monocytes Absolute: 0.6 10*3/uL (ref 0.2–1.0)
Monocytes Relative: 18 %
NEUTROS ABS: 1.5 10*3/uL (ref 1.4–6.5)
NEUTROS PCT: 47 %
Platelets: 237 10*3/uL (ref 150–440)
RBC: 2.76 MIL/uL — ABNORMAL LOW (ref 4.40–5.90)
RDW: 20.5 % — AB (ref 11.5–14.5)
WBC: 3.2 10*3/uL — ABNORMAL LOW (ref 3.8–10.6)

## 2017-09-22 LAB — PROTIME-INR
INR: 1.07
PROTHROMBIN TIME: 13.8 s (ref 11.4–15.2)

## 2017-09-22 LAB — GLUCOSE, CAPILLARY: Glucose-Capillary: 187 mg/dL — ABNORMAL HIGH (ref 65–99)

## 2017-09-22 MED ORDER — LIDOCAINE HCL (PF) 1 % IJ SOLN
INTRAMUSCULAR | Status: AC | PRN
  Administered 2017-09-22: 10 mL

## 2017-09-22 MED ORDER — FENTANYL CITRATE (PF) 100 MCG/2ML IJ SOLN
INTRAMUSCULAR | Status: AC
  Filled 2017-09-22: qty 4

## 2017-09-22 MED ORDER — SODIUM CHLORIDE 0.9 % IV SOLN
INTRAVENOUS | Status: DC
  Administered 2017-09-22: 09:00:00 via INTRAVENOUS

## 2017-09-22 MED ORDER — FENTANYL CITRATE (PF) 100 MCG/2ML IJ SOLN
INTRAMUSCULAR | Status: AC | PRN
  Administered 2017-09-22: 25 ug via INTRAVENOUS
  Administered 2017-09-22: 50 ug via INTRAVENOUS

## 2017-09-22 MED ORDER — HEPARIN SOD (PORK) LOCK FLUSH 100 UNIT/ML IV SOLN
INTRAVENOUS | Status: AC
  Filled 2017-09-22: qty 5

## 2017-09-22 MED ORDER — MIDAZOLAM HCL 5 MG/5ML IJ SOLN
INTRAMUSCULAR | Status: AC | PRN
  Administered 2017-09-22 (×2): 1 mg via INTRAVENOUS

## 2017-09-22 MED ORDER — HYDROCODONE-ACETAMINOPHEN 5-325 MG PO TABS
1.0000 | ORAL_TABLET | ORAL | Status: DC | PRN
  Filled 2017-09-22: qty 2

## 2017-09-22 MED ORDER — MIDAZOLAM HCL 5 MG/5ML IJ SOLN
INTRAMUSCULAR | Status: AC
  Filled 2017-09-22: qty 5

## 2017-09-22 NOTE — Procedures (Signed)
  Procedure: CT R iliac bone marrow biopsy    EBL:   minimal Complications:  none immediate  See full dictation in [facility].  Dillard Cannon MD Main # [phone] Pager  [phone]

## 2017-09-30 ENCOUNTER — Encounter: Payer: Self-pay | Admitting: Oncology

## 2017-10-04 ENCOUNTER — Other Ambulatory Visit: Payer: Self-pay | Admitting: Oncology

## 2017-10-04 DIAGNOSIS — D649 Anemia, unspecified: Secondary | ICD-10-CM

## 2017-10-04 NOTE — Progress Notes (Signed)
Hematology/Oncology Follow Up Note [facility]  [phone] [phone]  Patient Care Team: Tracie Harrier, MD as PCP - General (Internal Medicine)   Name of the patient: Paul Pham  [id number]  1925-10-28   REASON FOR VISIT  follow-up for management of anemia. HISTORY OF PRESENT ILLNESS Paul Petta. is a  84 y.o.  male with PMH listed below who was present as a follow-up after hospital admission for management of anemia. Patient was seen during his most recent admission on 08/18/2017. Patient is heart hearing. He has been feeling increased fatigue, dyspnea, and hemoglobin has dropped from baseline 10 to 7 within the past couple of months. Last colonoscopy was done 10 years ago. Outpatient stool occult test is negative. Patient also takes iron pills and stool is always black.  # He has had extensive workup including urinalysis negative for hematuria, normal W73 and folic level. Normal TSH, negative hemolysis workup. Her stool occult  is being negative as well. CT abdomen pelvis reveals no acute process   INTERVAL HISTORY Paul Millirons. is a 84 y.o. male who has above history reviewed by me today presents for follow up visit for management of anemia.  During the interval, patient has had a bone marrow biopsy done.  The biopsy results reviewed hypercellular bone marrow for age with dyspoietic changes.  Patient reports feeling at baseline, he has mild fatigue with exertion.  Denies any chest pain, shortness of breath, abdominal pain.  Review of systems Review of Systems  Constitutional: Positive for malaise/fatigue. Negative for chills, fever and weight loss.  HENT: Negative for ear discharge, hearing loss and tinnitus.   Eyes: Negative for blurred vision, pain and redness.  Respiratory: Negative for cough, hemoptysis and shortness of breath.   Cardiovascular: Negative for chest pain, palpitations and leg swelling.    Gastrointestinal: Negative for constipation, diarrhea, nausea and vomiting.  Genitourinary: Negative for dysuria and urgency.  Musculoskeletal: Negative for myalgias and neck pain.  Skin: Negative for rash.  Neurological: Negative for dizziness and headaches.  Endo/Heme/Allergies: Negative for environmental allergies. Does not bruise/bleed easily.  Psychiatric/Behavioral: Negative for depression and suicidal ideas.    No Known Allergies   Past Medical History:  Diagnosis Date  . Anemia   . Arthritis   . BPH (benign prostatic hypertrophy)   . Complication of anesthesia    nausea  . Diabetes type 2, controlled ([place])   . HOH (hard of hearing)    r and L ears-70% loss per pt  . Hyperlipidemia   . Hypertension      Past Surgical History:  Procedure Laterality Date  . APPENDECTOMY    . COLONOSCOPY  09/21/08   1 polyp found, tubular adenoma  . HAND SURGERY    . KNEE SURGERY Left     Social History   Socioeconomic History  . Marital status: Married    Spouse name: Not on file  . Number of children: Not on file  . Years of education: Not on file  . Highest education level: Not on file  Social Needs  . Financial resource strain: Not on file  . Food insecurity - worry: Not on file  . Food insecurity - inability: Not on file  . Transportation needs - medical: Not on file  . Transportation needs - non-medical: Not on file  Occupational History  . Not on file  Tobacco Use  . Smoking status: Never Smoker  . Smokeless tobacco: Never Used  Substance and  Sexual Activity  . Alcohol use: No    Alcohol/week: 0.0 oz  . Drug use: No  . Sexual activity: Not on file  Other Topics Concern  . Not on file  Social History Narrative  . Not on file    Family History  Problem Relation Age of Onset  . Aneurysm Mother   . Heart disease Father   . Cancer Brother        skin  . Heart disease Brother   . Heart disease Brother   . Cancer Sister        skin  . Aneurysm Brother    . Heart disease Brother   . Heart disease Sister   . Heart disease Sister   . COPD Sister   . Diabetes Sister      Current Outpatient Medications:  .  finasteride (PROSCAR) 5 MG tablet, Take 5 mg by mouth daily., Disp: , Rfl:  .  glucose blood (PRECISION QID TEST) test strip, Use 3 (three) times daily. Use as instructed., Disp: , Rfl:  .  lisinopril (PRINIVIL,ZESTRIL) 10 MG tablet, Take 10 mg by mouth daily., Disp: , Rfl:  .  metformin (FORTAMET) 1000 MG (OSM) 24 hr tablet, Take 1,000 mg by mouth 2 (two) times daily with a meal., Disp: , Rfl:  .  Omega-3 Fatty Acids (FISH OIL PO), Take by mouth., Disp: , Rfl:  .  simvastatin (ZOCOR) 40 MG tablet, Take 40 mg by mouth daily., Disp: , Rfl:   Physical exam:  Vitals:   10/05/17 1019  BP: (!) 141/49  Pulse: 73  Temp: 97.7 F (36.5 C)  TempSrc: Tympanic  Weight: 191 lb (86.6 kg)   Physical Exam  Constitutional: He is oriented to person, place, and time. No distress.  HENT:  Head: Normocephalic and atraumatic.  Mouth/Throat: No oropharyngeal exudate.  Eyes: Conjunctivae and EOM are normal. Pupils are equal, round, and reactive to light. Right eye exhibits no discharge. No scleral icterus.  Neck: Normal range of motion. Neck supple.  Cardiovascular: Normal rate and regular rhythm.  No murmur heard. Pulmonary/Chest: Effort normal and breath sounds normal. No respiratory distress.  Abdominal: Soft. Bowel sounds are normal. He exhibits no distension and no mass.  Musculoskeletal: Normal range of motion. He exhibits no edema or deformity.  Lymphadenopathy:    He has no cervical adenopathy.  Neurological: He is oriented to person, place, and time. No cranial nerve deficit.  Skin: Skin is warm and dry. There is pallor.  Psychiatric: Affect and judgment normal.    CMP Latest Ref Rng & Units 08/18/2017  Glucose 65 - 99 mg/dL 159(H)  BUN 6 - 20 mg/dL 22(H)  Creatinine 0.61 - 1.24 mg/dL 0.58(L)  Sodium 135 - 145 mmol/L 136  Potassium  3.5 - 5.1 mmol/L 3.8  Chloride 101 - 111 mmol/L 103  CO2 22 - 32 mmol/L 26  Calcium 8.9 - 10.3 mg/dL 8.5(L)  Total Protein 6.5 - 8.1 g/dL -  Total Bilirubin 0.3 - 1.2 mg/dL -  Alkaline Phos 38 - 126 U/L -  AST 15 - 41 U/L -  ALT 17 - 63 U/L -   CBC Latest Ref Rng & Units 09/22/2017  WBC 3.8 - 10.6 K/uL 3.2(L)  Hemoglobin 13.0 - 18.0 g/dL 7.8(L)  Hematocrit 40.0 - 52.0 % 25.2(L)  Platelets 150 - 440 K/uL 237   Patient's workup including stool occult was negative. UA negative for hematuria. Normal Y77 and folic levels. normal TSH. Negative hemolysis workup Inappropriate normal  reticulocyte count.   09/22/2017  Bone marrow biopsy Diagnosis Bone Marrow, Aspirate,Biopsy, and Clot, right iliac - HYPERCELLULAR BONE MARROW FOR AGE WITH DYSPOIETIC CHANGES.- SEE COMMENT. PERIPHERAL BLOOD: - NORMOCYTIC-HYPOCHROMIC ANEMIA. - LEUKOPENIA. Diagnosis Note The bone marrow is hypercellular for age with dyspoietic changes variably involving myeloid cell lines, but with main involvement of the granulocytic/monocytic cell line. This is associated with bone marrow monocytosis and borderline number to slight increase in blastic cells. The latter is mainly seen by immunohistochemistry. The overall changes favor a primary myeloid neoplasm, particularly a myelodysplastic syndrome especially refractory anemia with excess blasts (RAEB-1) or possibly refractory cytopenia with multilineage dysplasia. Consideration was also given to an evolving myelodysplastic/myeloproliferative neoplasm such as chronic myelomonocytic leukemia but the lack of absolute peripheral monocytosis precludes such a diagnosis at this time. Correlation with cytogenetic studies is recommended. (BNS:ah:ecj 09/24/17) The blastic cells represent 5% of all cells Bone Marrow Flow Cytometry - NO SIGNIFICANT CD34 BLASTIC POPULATION IDENTIFIED  Assessment and plan Patient is a 84 y.o. male presents with symptomatic anemia.  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 MDS (myelodysplastic syndrome) ([place])   3. Goals of care, counseling/discussion   RAEB-1 1 pathology results were discussed with patient.  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  Blast count is 5%.  We are still waiting for cytogenetics and the next generation sequencing.  After we receive these results will be able to calculate his I PSS risk group.  Check erythropoietin today.  He may be a candidate for Procrit treatments.  No need for transfusion today as hemoglobin is above 7. Goal of care discussion: Discussed with patient for MDS, the only cure is bone marrow transplant which is not feasible with his age.  Remaining of the treatments options are palliative intent, and aim to support him and to minimize symptoms, and hopefully prolong life.  Will discuss again once we have cytogenetic results back.  Goal for care is palliative.  Patient and his family members to questions and all questions answered to satisfaction. Total face to face encounter time for this patient visit was 40 min. >50% of the time was  spent in counseling and coordination of care.   Follow-up in 2 weeks. With CBC and possible procrit.   Earlie Server, MD, PhD Hematology Oncology [facility] at [facility] Pager- [phone] 10/04/2017

## 2017-10-05 ENCOUNTER — Inpatient Hospital Stay: Payer: Medicare Other | Attending: Oncology | Admitting: Oncology

## 2017-10-05 ENCOUNTER — Inpatient Hospital Stay: Payer: Medicare Other | Admitting: *Deleted

## 2017-10-05 ENCOUNTER — Encounter: Payer: Self-pay | Admitting: Oncology

## 2017-10-05 ENCOUNTER — Inpatient Hospital Stay: Payer: Medicare Other

## 2017-10-05 VITALS — BP 141/49 | HR 73 | Temp 97.7°F | Wt 191.0 lb

## 2017-10-05 DIAGNOSIS — Z7189 Other specified counseling: Secondary | ICD-10-CM

## 2017-10-05 DIAGNOSIS — E785 Hyperlipidemia, unspecified: Secondary | ICD-10-CM | POA: Insufficient documentation

## 2017-10-05 DIAGNOSIS — D630 Anemia in neoplastic disease: Secondary | ICD-10-CM | POA: Insufficient documentation

## 2017-10-05 DIAGNOSIS — Z79899 Other long term (current) drug therapy: Secondary | ICD-10-CM | POA: Insufficient documentation

## 2017-10-05 DIAGNOSIS — I10 Essential (primary) hypertension: Secondary | ICD-10-CM | POA: Insufficient documentation

## 2017-10-05 DIAGNOSIS — D469 Myelodysplastic syndrome, unspecified: Secondary | ICD-10-CM | POA: Diagnosis present

## 2017-10-05 DIAGNOSIS — E119 Type 2 diabetes mellitus without complications: Secondary | ICD-10-CM | POA: Insufficient documentation

## 2017-10-05 DIAGNOSIS — N400 Enlarged prostate without lower urinary tract symptoms: Secondary | ICD-10-CM | POA: Diagnosis not present

## 2017-10-05 DIAGNOSIS — D649 Anemia, unspecified: Secondary | ICD-10-CM

## 2017-10-05 LAB — CBC WITH DIFFERENTIAL/PLATELET
Basophils Absolute: 0 10*3/uL (ref 0–0.1)
Basophils Relative: 0 %
Eosinophils Absolute: 0 10*3/uL (ref 0–0.7)
Eosinophils Relative: 0 %
HEMATOCRIT: 23.2 % — AB (ref 40.0–52.0)
HEMOGLOBIN: 7.2 g/dL — AB (ref 13.0–18.0)
LYMPHS ABS: 1.6 10*3/uL (ref 1.0–3.6)
LYMPHS PCT: 31 %
MCH: 28.3 pg (ref 26.0–34.0)
MCHC: 30.9 g/dL — ABNORMAL LOW (ref 32.0–36.0)
MCV: 91.4 fL (ref 80.0–100.0)
Monocytes Absolute: 1 10*3/uL (ref 0.2–1.0)
Monocytes Relative: 19 %
NEUTROS ABS: 2.5 10*3/uL (ref 1.4–6.5)
NEUTROS PCT: 50 %
Platelets: 241 10*3/uL (ref 150–440)
RBC: 2.54 MIL/uL — AB (ref 4.40–5.90)
RDW: 20.7 % — ABNORMAL HIGH (ref 11.5–14.5)
WBC: 5.1 10*3/uL (ref 3.8–10.6)

## 2017-10-05 LAB — SAMPLE TO BLOOD BANK

## 2017-10-05 NOTE — Progress Notes (Signed)
Patient here today for follow up.   

## 2017-10-06 LAB — ERYTHROPOIETIN: Erythropoietin: 104.8 m[IU]/mL — ABNORMAL HIGH (ref 2.6–18.5)

## 2017-10-07 ENCOUNTER — Other Ambulatory Visit: Payer: Self-pay | Admitting: Oncology

## 2017-10-07 DIAGNOSIS — Z7189 Other specified counseling: Secondary | ICD-10-CM | POA: Insufficient documentation

## 2017-10-11 ENCOUNTER — Encounter (HOSPITAL_COMMUNITY): Payer: Self-pay

## 2017-10-18 NOTE — Progress Notes (Signed)
Hematology/Oncology Follow Up Note [facility]  Telephone:([phone] [phone]  Patient Care Team: Tracie Harrier, MD as PCP - General (Internal Medicine)   Name of the patient: Paul Pham  [id number]  08/26/1926   REASON FOR VISIT  follow-up for management of anemia. HISTORY OF PRESENT ILLNESS Paul Honeycutt. is a  84 y.o.  male with PMH listed below who was present as a follow-up after hospital admission for management of anemia. Patient was seen during his most recent admission on 08/18/2017. Patient is heart hearing. He has been feeling increased fatigue, dyspnea, and hemoglobin has dropped from baseline 10 to 7 within the past couple of months. Last colonoscopy was done 10 years ago. Outpatient stool occult test is negative. Patient also takes iron pills and stool is always black.  # He has had extensive workup including urinalysis negative for hematuria, normal H29 and folic level. Normal TSH, negative hemolysis workup. Her stool occult  is being negative as well. CT abdomen pelvis reveals no acute process  # BM biopsy on 09/22/2017 showed  hypercellular bone marrow for age with dyspoietic changes, blast 5%, differential includes RAEB-1 vs CMML, favor RAEB-1   INTERVAL HISTORY Paul Pham. is a 84 y.o. male who has above history reviewed by me today presents for follow up visit for management of anemia. Accompanied by two sons and wife.  Patient reports feeling at baseline, he has mild fatigue with exertion.  Denies any chest pain, shortness of breath, abdominal pain.  Review of systems Review of Systems  Constitutional: Positive for malaise/fatigue. Negative for chills, fever and weight loss.  HENT: Negative for ear discharge, hearing loss and tinnitus.   Eyes: Negative for blurred vision, pain and redness.  Respiratory: Negative for cough, hemoptysis and shortness of breath.   Cardiovascular: Negative for chest pain, palpitations  and leg swelling.  Gastrointestinal: Negative for constipation, diarrhea, nausea and vomiting.  Genitourinary: Negative for dysuria and urgency.  Musculoskeletal: Negative for myalgias and neck pain.  Skin: Negative for rash.  Neurological: Negative for dizziness and headaches.  Endo/Heme/Allergies: Negative for environmental allergies. Does not bruise/bleed easily.  Psychiatric/Behavioral: Negative for depression and suicidal ideas.    No Known Allergies   Past Medical History:  Diagnosis Date  . Anemia   . Arthritis   . BPH (benign prostatic hypertrophy)   . Complication of anesthesia    nausea  . Diabetes type 2, controlled ([place])   . HOH (hard of hearing)    r and L ears-70% loss per pt  . Hyperlipidemia   . Hypertension      Past Surgical History:  Procedure Laterality Date  . APPENDECTOMY    . COLONOSCOPY  09/21/08   1 polyp found, tubular adenoma  . HAND SURGERY    . KNEE SURGERY Left     Social History   Socioeconomic History  . Marital status: Married    Spouse name: Not on file  . Number of children: Not on file  . Years of education: Not on file  . Highest education level: Not on file  Social Needs  . Financial resource strain: Not on file  . Food insecurity - worry: Not on file  . Food insecurity - inability: Not on file  . Transportation needs - medical: Not on file  . Transportation needs - non-medical: Not on file  Occupational History  . Not on file  Tobacco Use  . Smoking status: Never Smoker  . Smokeless tobacco:  Never Used  Substance and Sexual Activity  . Alcohol use: No    Alcohol/week: 0.0 oz  . Drug use: No  . Sexual activity: Not on file  Other Topics Concern  . Not on file  Social History Narrative  . Not on file    Family History  Problem Relation Age of Onset  . Aneurysm Mother   . Heart disease Father   . Cancer Brother        skin  . Heart disease Brother   . Heart disease Brother   . Cancer Sister        skin  .  Aneurysm Brother   . Heart disease Brother   . Heart disease Sister   . Heart disease Sister   . COPD Sister   . Diabetes Sister      Current Outpatient Medications:  .  finasteride (PROSCAR) 5 MG tablet, Take 5 mg by mouth daily., Disp: , Rfl:  .  glucose blood (PRECISION QID TEST) test strip, Use 3 (three) times daily. Use as instructed., Disp: , Rfl:  .  lisinopril (PRINIVIL,ZESTRIL) 10 MG tablet, Take 10 mg by mouth daily., Disp: , Rfl:  .  metformin (FORTAMET) 1000 MG (OSM) 24 hr tablet, Take 1,000 mg by mouth 2 (two) times daily with a meal., Disp: , Rfl:  .  Omega-3 Fatty Acids (FISH OIL PO), Take by mouth., Disp: , Rfl:  .  simvastatin (ZOCOR) 40 MG tablet, Take 40 mg by mouth daily., Disp: , Rfl:   Physical exam:  There were no vitals filed for this visit. Physical Exam  Constitutional: He is oriented to person, place, and time. No distress.  HENT:  Head: Normocephalic and atraumatic.  Mouth/Throat: No oropharyngeal exudate.  Eyes: Conjunctivae and EOM are normal. Pupils are equal, round, and reactive to light. Right eye exhibits no discharge. No scleral icterus.  Neck: Normal range of motion. Neck supple.  Cardiovascular: Normal rate and regular rhythm.  No murmur heard. Pulmonary/Chest: Effort normal and breath sounds normal. No respiratory distress.  Abdominal: Soft. Bowel sounds are normal. He exhibits no distension and no mass.  Musculoskeletal: Normal range of motion. He exhibits no edema or deformity.  Lymphadenopathy:    He has no cervical adenopathy.  Neurological: He is oriented to person, place, and time. No cranial nerve deficit.  Skin: Skin is warm and dry. There is pallor.  Psychiatric: Affect and judgment normal.    CMP Latest Ref Rng & Units 08/18/2017  Glucose 65 - 99 mg/dL 159(H)  BUN 6 - 20 mg/dL 22(H)  Creatinine 0.61 - 1.24 mg/dL 0.58(L)  Sodium 135 - 145 mmol/L 136  Potassium 3.5 - 5.1 mmol/L 3.8  Chloride 101 - 111 mmol/L 103  CO2 22 - 32  mmol/L 26  Calcium 8.9 - 10.3 mg/dL 8.5(L)  Total Protein 6.5 - 8.1 g/dL -  Total Bilirubin 0.3 - 1.2 mg/dL -  Alkaline Phos 38 - 126 U/L -  AST 15 - 41 U/L -  ALT 17 - 63 U/L -   CBC Latest Ref Rng & Units 10/05/2017  WBC 3.8 - 10.6 K/uL 5.1  Hemoglobin 13.0 - 18.0 g/dL 7.2(L)  Hematocrit 40.0 - 52.0 % 23.2(L)  Platelets 150 - 440 K/uL 241   Patient's workup including stool occult was negative. UA negative for hematuria. Normal A19 and folic levels. normal TSH. Negative hemolysis workup Inappropriate normal reticulocyte count.   09/22/2017  Bone marrow biopsy Diagnosis Bone Marrow, Aspirate,Biopsy, and Clot, right  iliac - HYPERCELLULAR BONE MARROW FOR AGE WITH DYSPOIETIC CHANGES.- SEE COMMENT. PERIPHERAL BLOOD: - NORMOCYTIC-HYPOCHROMIC ANEMIA. - LEUKOPENIA. Diagnosis Note The bone marrow is hypercellular for age with dyspoietic changes variably involving myeloid cell lines, but with main involvement of the granulocytic/monocytic cell line. This is associated with bone marrow monocytosis and borderline number to slight increase in blastic cells. The latter is mainly seen by immunohistochemistry. The overall changes favor a primary myeloid neoplasm, particularly a myelodysplastic syndrome especially refractory anemia with excess blasts (RAEB-1) or possibly refractory cytopenia with multilineage dysplasia. Consideration was also given to an evolving myelodysplastic/myeloproliferative neoplasm such as chronic myelomonocytic leukemia but the lack of absolute peripheral monocytosis precludes such a diagnosis at this time. Correlation with cytogenetic studies is recommended. (BNS:ah:ecj 09/24/17) The blastic cells represent 5% of all cells Bone Marrow Flow Cytometry - NO SIGNIFICANT CD34 [place] one testing positive for  ASXL1 [id number]*04 EZH2 Splice site [id number]>J RUNX1 A838s*56  Assessment and plan Patient is a 84 y.o. male presents with symptomatic  anemia.  1. MDS (myelodysplastic syndrome) ([place])   2. Anemia in neoplastic disease    likely RAEB-1, cytogenetics pending.   Foundation one results were discussed with patient,and they are made aware that these mutations are associated with poor prognosis.  Awaiting cytogenetic studies to assign his IPSS-R group.  # Supportive care and possible chemotherapy with hypomethylating agents were discussed with patient and family.  Will start him on Aranesp 148mg Goal of care was discussed, is palliative.   IPSS -R 4.5, intermediate group. However, he carried 3 of the five high risk gene mutation, which is associated with shorter OS for this group, likely assemble to high risk group. With median survival 1.6 years.  Will recommend Azacitidine treatment.   # Anemia: type and screen, [facility] [place].  Patient and his family members to questions and all questions answered to satisfaction.   Follow-up in 2 weeks. With CBC and possible procrit.   ZEarlie Server MD, PhD Hematology [facility]ager- [phone]/21/2019

## 2017-10-19 ENCOUNTER — Inpatient Hospital Stay: Payer: Medicare Other

## 2017-10-19 ENCOUNTER — Inpatient Hospital Stay (HOSPITAL_BASED_OUTPATIENT_CLINIC_OR_DEPARTMENT_OTHER): Payer: Medicare Other | Admitting: Oncology

## 2017-10-19 ENCOUNTER — Encounter: Payer: Self-pay | Admitting: Oncology

## 2017-10-19 ENCOUNTER — Other Ambulatory Visit: Payer: Self-pay | Admitting: *Deleted

## 2017-10-19 ENCOUNTER — Encounter (HOSPITAL_COMMUNITY): Payer: Self-pay

## 2017-10-19 VITALS — BP 138/66 | HR 76 | Temp 98.5°F | Resp 18 | Ht 73.0 in | Wt 191.8 lb

## 2017-10-19 DIAGNOSIS — D630 Anemia in neoplastic disease: Secondary | ICD-10-CM | POA: Diagnosis not present

## 2017-10-19 DIAGNOSIS — D469 Myelodysplastic syndrome, unspecified: Secondary | ICD-10-CM

## 2017-10-19 DIAGNOSIS — Z79899 Other long term (current) drug therapy: Secondary | ICD-10-CM | POA: Diagnosis not present

## 2017-10-19 LAB — CBC WITH DIFFERENTIAL/PLATELET
BASOS ABS: 0 10*3/uL (ref 0–0.1)
BASOS PCT: 0 %
EOS ABS: 0 10*3/uL (ref 0–0.7)
Eosinophils Relative: 0 %
HCT: 21.6 % — ABNORMAL LOW (ref 40.0–52.0)
Hemoglobin: 6.7 g/dL — ABNORMAL LOW (ref 13.0–18.0)
LYMPHS ABS: 1.6 10*3/uL (ref 1.0–3.6)
Lymphocytes Relative: 30 %
MCH: 28.5 pg (ref 26.0–34.0)
MCHC: 31.2 g/dL — AB (ref 32.0–36.0)
MCV: 91.4 fL (ref 80.0–100.0)
Monocytes Absolute: 0.9 10*3/uL (ref 0.2–1.0)
Monocytes Relative: 17 %
NEUTROS ABS: 2.9 10*3/uL (ref 1.4–6.5)
Neutrophils Relative %: 53 %
PLATELETS: 230 10*3/uL (ref 150–440)
RBC: 2.37 MIL/uL — ABNORMAL LOW (ref 4.40–5.90)
RDW: 21.2 % — ABNORMAL HIGH (ref 11.5–14.5)
WBC: 5.4 10*3/uL (ref 3.8–10.6)

## 2017-10-19 LAB — SAMPLE TO BLOOD BANK

## 2017-10-19 MED ORDER — DARBEPOETIN ALFA 150 MCG/0.3ML IJ SOSY
150.0000 ug | PREFILLED_SYRINGE | INTRAMUSCULAR | Status: DC
  Administered 2017-10-19: 150 ug via SUBCUTANEOUS
  Filled 2017-10-19: qty 0.3

## 2017-10-19 NOTE — Progress Notes (Signed)
MD changed to Aranesp.

## 2017-10-19 NOTE — Progress Notes (Signed)
No new changes noted today 

## 2017-10-20 ENCOUNTER — Inpatient Hospital Stay: Payer: Medicare Other

## 2017-10-20 DIAGNOSIS — D469 Myelodysplastic syndrome, unspecified: Secondary | ICD-10-CM | POA: Diagnosis not present

## 2017-10-20 DIAGNOSIS — D630 Anemia in neoplastic disease: Secondary | ICD-10-CM

## 2017-10-20 MED ORDER — ACETAMINOPHEN 325 MG PO TABS
650.0000 mg | ORAL_TABLET | Freq: Once | ORAL | Status: AC
  Administered 2017-10-20: 650 mg via ORAL
  Filled 2017-10-20: qty 2

## 2017-10-20 MED ORDER — DIPHENHYDRAMINE HCL 25 MG PO CAPS
25.0000 mg | ORAL_CAPSULE | Freq: Once | ORAL | Status: AC
  Administered 2017-10-20: 25 mg via ORAL
  Filled 2017-10-20: qty 1

## 2017-10-20 MED ORDER — SODIUM CHLORIDE 0.9 % IV SOLN
250.0000 mL | Freq: Once | INTRAVENOUS | Status: AC
  Administered 2017-10-20: 250 mL via INTRAVENOUS
  Filled 2017-10-20: qty 250

## 2017-10-21 LAB — TYPE AND SCREEN
ABO/RH(D): B POS
ANTIBODY SCREEN: NEGATIVE
Unit division: 0

## 2017-10-21 LAB — BPAM RBC
BLOOD PRODUCT EXPIRATION DATE: 201903112359
ISSUE DATE / TIME: 201902201029
UNIT TYPE AND RH: 7300

## 2017-10-21 LAB — PREPARE RBC (CROSSMATCH)

## 2017-10-22 LAB — CHROMOSOME ANALYSIS, BONE MARROW

## 2017-10-27 ENCOUNTER — Other Ambulatory Visit: Payer: Self-pay | Admitting: *Deleted

## 2017-10-27 DIAGNOSIS — D708 Other neutropenia: Secondary | ICD-10-CM

## 2017-10-28 ENCOUNTER — Inpatient Hospital Stay: Payer: Medicare Other

## 2017-10-28 ENCOUNTER — Other Ambulatory Visit: Payer: Self-pay

## 2017-10-28 ENCOUNTER — Inpatient Hospital Stay (HOSPITAL_BASED_OUTPATIENT_CLINIC_OR_DEPARTMENT_OTHER): Payer: Medicare Other | Admitting: Oncology

## 2017-10-28 ENCOUNTER — Encounter: Payer: Self-pay | Admitting: Oncology

## 2017-10-28 VITALS — BP 154/51 | HR 84 | Temp 98.9°F | Wt 185.2 lb

## 2017-10-28 DIAGNOSIS — D469 Myelodysplastic syndrome, unspecified: Secondary | ICD-10-CM | POA: Diagnosis not present

## 2017-10-28 DIAGNOSIS — D630 Anemia in neoplastic disease: Secondary | ICD-10-CM

## 2017-10-28 DIAGNOSIS — D708 Other neutropenia: Secondary | ICD-10-CM

## 2017-10-28 DIAGNOSIS — Z79899 Other long term (current) drug therapy: Secondary | ICD-10-CM

## 2017-10-28 LAB — CBC WITH DIFFERENTIAL/PLATELET
BASOS ABS: 0 10*3/uL (ref 0–0.1)
BASOS PCT: 0 %
EOS ABS: 0 10*3/uL (ref 0–0.7)
Eosinophils Relative: 0 %
HEMATOCRIT: 24.6 % — AB (ref 40.0–52.0)
Hemoglobin: 7.8 g/dL — ABNORMAL LOW (ref 13.0–18.0)
LYMPHS ABS: 1.7 10*3/uL (ref 1.0–3.6)
LYMPHS PCT: 43 %
MCH: 28 pg (ref 26.0–34.0)
MCHC: 31.7 g/dL — AB (ref 32.0–36.0)
MCV: 88.3 fL (ref 80.0–100.0)
MONO ABS: 0.5 10*3/uL (ref 0.2–1.0)
Monocytes Relative: 12 %
NEUTROS ABS: 1.7 10*3/uL (ref 1.4–6.5)
Neutrophils Relative %: 45 %
PLATELETS: 254 10*3/uL (ref 150–440)
RBC: 2.79 MIL/uL — ABNORMAL LOW (ref 4.40–5.90)
RDW: 19.1 % — AB (ref 11.5–14.5)
WBC: 3.9 10*3/uL (ref 3.8–10.6)

## 2017-10-28 LAB — SAMPLE TO BLOOD BANK

## 2017-10-28 MED ORDER — DARBEPOETIN ALFA 150 MCG/0.3ML IJ SOSY
150.0000 ug | PREFILLED_SYRINGE | INTRAMUSCULAR | Status: DC
  Administered 2017-10-28: 150 ug via SUBCUTANEOUS

## 2017-10-28 NOTE — Progress Notes (Signed)
Hematology/Oncology Follow Up Note [facility]  [phone] [phone]  Patient Care Team: Tracie Harrier, MD as PCP - General (Internal Medicine)   Name of the patient: Paul Pham  [id number]  1925-09-07   REASON FOR VISIT  follow-up for management of anemia. HISTORY OF PRESENT ILLNESS Paul Pham. is a  84 y.o.  male with PMH listed below who was present as a follow-up after hospital admission for management of anemia. Patient was seen during his most recent admission on 08/18/2017. Patient is heart hearing. He has been feeling increased fatigue, dyspnea, and hemoglobin has dropped from baseline 10 to 7 within the past couple of months. Last colonoscopy was done 10 years ago. Outpatient stool occult test is negative. Patient also takes iron pills and stool is always black.  # He has had extensive workup including urinalysis negative for hematuria, normal Q82 and folic level. Normal TSH, negative hemolysis workup. Her stool occult  is being negative as well. CT abdomen pelvis reveals no acute process  # BM biopsy on 09/22/2017 showed  hypercellular bone marrow for age with dyspoietic changes, blast 5%, differential includes RAEB-1 vs CMML, favor RAEB-1   INTERVAL HISTORY Paul Pham. is a 84 y.o. male who has above history reviewed by me today presents for follow up visit for management of anemia. Accompanied by two sons and wife. Received 1 unit of PRBCs during the interval and s/p Aranesp 121m x1.  He feels slightly less fatigue,  Denies any chest pain, shortness of breath, abdominal pain.  Review of systems Review of Systems  Constitutional: Positive for malaise/fatigue. Negative for chills, fever and weight loss.  HENT: Negative for congestion, ear discharge, hearing loss and tinnitus.   Eyes: Negative for blurred vision, photophobia, pain and redness.  Respiratory: Negative for cough, hemoptysis and shortness of breath.     Cardiovascular: Negative for chest pain and leg swelling.  Gastrointestinal: Negative for constipation, diarrhea, nausea and vomiting.  Genitourinary: Negative for dysuria, frequency and urgency.  Musculoskeletal: Negative for back pain, myalgias and neck pain.  Skin: Negative for rash.  Neurological: Negative for dizziness and headaches.  Endo/Heme/Allergies: Negative for environmental allergies. Does not bruise/bleed easily.  Psychiatric/Behavioral: Negative for depression and suicidal ideas.    No Known Allergies   Past Medical History:  Diagnosis Date  . Anemia   . Arthritis   . BPH (benign prostatic hypertrophy)   . Complication of anesthesia    nausea  . [place] 2[place]   . HOH (hard of hearing)    r and L ears-70% loss per pt  . Hyperlipidemia   . Hypertension      Past Surgical History:  Procedure Laterality Date  . APPENDECTOMY    . COLONOSCOPY  09/21/08   1 polyp found, tubular adenoma  . HAND SURGERY    . KNEE SURGERY Left     Social History   Socioeconomic History  . Marital status: Married    Spouse name: Not on file  . Number of children: Not on file  . Years of education: Not on file  . Highest education level: Not on file  Social Needs  . Financial resource strain: Not on file  . Food insecurity - worry: Not on file  . Food insecurity - inability: Not on file  . Transportation needs - medical: Not on file  . Transportation needs - non-medical: Not on file  Occupational History  . Not on file  Tobacco Use  . Smoking status: Never Smoker  . Smokeless tobacco: Never Used  Substance and Sexual Activity  . Alcohol use: No    Alcohol/week: 0.0 oz  . Drug use: No  . Sexual activity: Not on file  Other Topics Concern  . Not on file  Social History Narrative  . Not on file    Family History  Problem Relation Age of Onset  . Aneurysm Mother   . Heart disease Father   . Cancer Brother        skin  . Heart disease  Brother   . Heart disease Brother   . Cancer Sister        skin  . Aneurysm Brother   . Heart disease Brother   . Heart disease Sister   . Heart disease Sister   . COPD Sister   . Diabetes Sister      Current Outpatient Medications:  .  finasteride (PROSCAR) 5 MG tablet, Take 5 mg by mouth daily., Disp: , Rfl:  .  glucose blood (PRECISION QID TEST) test strip, Use 3 (three) times daily. Use as instructed., Disp: , Rfl:  .  lisinopril (PRINIVIL,ZESTRIL) 10 MG tablet, Take 10 mg by mouth daily., Disp: , Rfl:  .  metformin (FORTAMET) 1000 MG (OSM) 24 hr tablet, Take 1,000 mg by mouth 2 (two) times daily with a meal., Disp: , Rfl:  .  Omega-3 Fatty Acids (FISH OIL PO), Take by mouth., Disp: , Rfl:  .  simvastatin (ZOCOR) 40 MG tablet, Take 40 mg by mouth daily., Disp: , Rfl:   Physical exam:  Vitals:   10/28/17 1002  BP: (!) 154/51  Pulse: 84  Temp: 98.9 F (37.2 C)  TempSrc: Tympanic  Weight: 185 lb 3 oz (84 kg)   Physical Exam  Constitutional: He is oriented to person, place, and time. No distress.  HENT:  Head: Normocephalic and atraumatic.  Mouth/Throat: Oropharynx is clear and moist. No oropharyngeal exudate.  Eyes: Conjunctivae and EOM are normal. Pupils are equal, round, and reactive to light. Right eye exhibits no discharge. No scleral icterus.  Neck: Normal range of motion. Neck supple. No JVD present.  Cardiovascular: Normal rate, regular rhythm and normal heart sounds.  No murmur heard. Pulmonary/Chest: Effort normal and breath sounds normal. No respiratory distress.  Abdominal: Soft. Bowel sounds are normal. He exhibits no distension and no mass. There is no tenderness. There is no rebound.  Musculoskeletal: Normal range of motion. He exhibits no edema or deformity.  Lymphadenopathy:    He has no cervical adenopathy.  Neurological: He is oriented to person, place, and time. No cranial nerve deficit. Coordination normal.  Skin: Skin is warm and dry. There is  pallor.  Psychiatric: Affect and judgment normal.    CMP Latest Ref Rng & Units 08/18/2017  Glucose 65 - 99 mg/dL 159(H)  BUN 6 - 20 mg/dL 22(H)  Creatinine 0.61 - 1.24 mg/dL 0.58(L)  Sodium 135 - 145 mmol/L 136  Potassium 3.5 - 5.1 mmol/L 3.8  Chloride 101 - 111 mmol/L 103  CO2 22 - 32 mmol/L 26  Calcium 8.9 - 10.3 mg/dL 8.5(L)  Total Protein 6.5 - 8.1 g/dL -  Total Bilirubin 0.3 - 1.2 mg/dL -  Alkaline Phos 38 - 126 U/L -  AST 15 - 41 U/L -  ALT 17 - 63 U/L -   CBC Latest Ref Rng & Units 10/28/2017  WBC 3.8 - 10.6 K/uL 3.9  Hemoglobin 13.0 - 18.0 g/dL  7.8(L)  Hematocrit 40.0 - 52.0 % 24.6(L)  Platelets 150 - 440 K/uL 254   Patient's workup including stool occult was negative. UA negative for hematuria. Normal O47 and folic levels. normal TSH. Negative hemolysis workup Inappropriate normal reticulocyte count.   09/22/2017  Bone marrow biopsy Diagnosis Bone Marrow, Aspirate,Biopsy, and Clot, right iliac - HYPERCELLULAR BONE MARROW FOR AGE WITH DYSPOIETIC CHANGES.- SEE COMMENT. PERIPHERAL BLOOD: - NORMOCYTIC-HYPOCHROMIC ANEMIA. - LEUKOPENIA. Diagnosis Note The bone marrow is hypercellular for age with dyspoietic changes variably involving myeloid cell lines, but with main involvement of the granulocytic/monocytic cell line. This is associated with bone marrow monocytosis and borderline number to slight increase in blastic cells. The latter is mainly seen by immunohistochemistry. The overall changes favor a primary myeloid neoplasm, particularly a myelodysplastic syndrome especially refractory anemia with excess blasts (RAEB-1) or possibly refractory cytopenia with multilineage dysplasia. Consideration was also given to an evolving myelodysplastic/myeloproliferative neoplasm such as chronic myelomonocytic leukemia but the lack of absolute peripheral monocytosis precludes such a diagnosis at this time. Correlation with cytogenetic studies is recommended. (BNS:ah:ecj 09/24/17) The  blastic cells represent 5% of all cells Bone Marrow Flow Cytometry - NO SIGNIFICANT CD34 [place] one testing positive for  ASXL1 Q412KS*08 EZH2 Splice site [id number] RUNX1 A838s*56  Assessment and plan Patient is a 84 y.o. male presents with symptomatic anemia.  1. 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 Foundation one results were discussed with patient,and they are made aware that these mutations are associated with poor prognosis.   IPSS -R 4.5, intermediate group. However, he carried 3 of the five high risk gene mutation, which is associated with shorter OS for this group, likely assemble to high risk group. With median survival 1.6 years.  Will recommend Azacitidine treatment.  I explained to the patient the risks and benefits of Azacitidine including all but not limited to  nausea, vomiting, low blood counts, bleeding, and risk of life threatening infection and even death, secondary malignancy etc.  . # Chemotherapy education; Plan Azacitadine 90m/m2 Day 1-5 every 28 days.   # Supportive care and possible chemotherapy with hypomethylating agents were discussed with patient and family.  Aranesp 1518m x1 today Goal of care was discussed, is palliative. Patient and his family members understand that this condition is not curable.   #Patient and his family members to questions and all questions answered to satisfaction.  Follow-up in 1 week for assessment before Azacitadine.   Total face to face encounter time for this patient visit was 40 min. >50% of the time was  spent in counseling and coordination of care.   ZhEarlie ServerMD, PhD Hematology Oncology CoSt. Elizabeth Community Hospitalt AlB[facility] Center[facility]- [phone]/28/2019

## 2017-10-28 NOTE — Progress Notes (Signed)
Patient here today for follow up.   

## 2017-10-28 NOTE — Patient Instructions (Signed)
Azacitidine suspension for injection (subcutaneous use) What is this medicine? AZACITIDINE (ay za SITE i deen) is a chemotherapy drug. This medicine reduces the growth of cancer cells and can suppress the immune system. It is used for treating myelodysplastic syndrome or some types of leukemia. This medicine may be used for other purposes; ask your health care provider or pharmacist if you have questions. COMMON BRAND NAME(S): Vidaza What should I tell my health care provider before I take this medicine? They need to know if you have any of these conditions: -kidney disease -liver disease -liver tumors -an unusual or allergic reaction to azacitidine, mannitol, other medicines, foods, dyes, or preservatives -pregnant or trying to get pregnant -breast-feeding How should I use this medicine? This medicine is for injection under the skin. It is administered in a hospital or clinic by a specially trained health care professional. Talk to your pediatrician regarding the use of this medicine in children. While this drug may be prescribed for selected conditions, precautions do apply. Overdosage: If you think you have taken too much of this medicine contact a poison control center or emergency room at once. NOTE: This medicine is only for you. Do not share this medicine with others. What if I miss a dose? It is important not to miss your dose. Call your doctor or health care professional if you are unable to keep an appointment. What may interact with this medicine? Interactions have not been studied. Give your health care provider a list of all the medicines, herbs, non-prescription drugs, or dietary supplements you use. Also tell them if you smoke, drink alcohol, or use illegal drugs. Some items may interact with your medicine. This list may not describe all possible interactions. Give your health care provider a list of all the medicines, herbs, non-prescription drugs, or dietary supplements you  use. Also tell them if you smoke, drink alcohol, or use illegal drugs. Some items may interact with your medicine. What should I watch for while using this medicine? Visit your doctor for checks on your progress. This drug may make you feel generally unwell. This is not uncommon, as chemotherapy can affect healthy cells as well as cancer cells. Report any side effects. Continue your course of treatment even though you feel ill unless your doctor tells you to stop. In some cases, you may be given additional medicines to help with side effects. Follow all directions for their use. Call your doctor or health care professional for advice if you get a fever, chills or sore throat, or other symptoms of a cold or flu. Do not treat yourself. This drug decreases your body's ability to fight infections. Try to avoid being around people who are sick. This medicine may increase your risk to bruise or bleed. Call your doctor or health care professional if you notice any unusual bleeding. You may need blood work done while you are taking this medicine. Do not become pregnant while taking this medicine and for 6 months after the last dose. Women should inform their doctor if they wish to become pregnant or think they might be pregnant. Men should not father a child while taking this medicine and for 3 months after the last dose. There is a potential for serious side effects to an unborn child. Talk to your health care professional or pharmacist for more information. Do not breast-feed an infant while taking this medicine and for 1 week after the last dose. This medicine may interfere with the ability to have a child.   Talk with your doctor or health care professional if you are concerned about your fertility. What side effects may I notice from receiving this medicine? Side effects that you should report to your doctor or health care professional as soon as possible: -allergic reactions like skin rash, itching or hives,  swelling of the face, lips, or tongue -low blood counts - this medicine may decrease the number of white blood cells, red blood cells and platelets. You may be at increased risk for infections and bleeding. -signs of infection - fever or chills, cough, sore throat, pain passing urine -signs of decreased platelets or bleeding - bruising, pinpoint red spots on the skin, black, tarry stools, blood in the urine -signs of decreased red blood cells - unusually weak or tired, fainting spells, lightheadedness -signs and symptoms of kidney injury like trouble passing urine or change in the amount of urine -signs and symptoms of liver injury like dark yellow or brown urine; general ill feeling or flu-like symptoms; light-colored stools; loss of appetite; nausea; right upper belly pain; unusually weak or tired; yellowing of the eyes or skin Side effects that usually do not require medical attention (report to your doctor or health care professional if they continue or are bothersome): -constipation -diarrhea -nausea, vomiting -pain or redness at the injection site -unusually weak or tired This list may not describe all possible side effects. Call your doctor for medical advice about side effects. You may report side effects to FDA at 1-800-FDA-1088. Where should I keep my medicine? This drug is given in a hospital or clinic and will not be stored at home. NOTE: This sheet is a summary. It may not cover all possible information. If you have questions about this medicine, talk to your doctor, pharmacist, or health care provider.  2018 Elsevier/Gold Standard (2016-09-15 14:37:51)  

## 2017-10-28 NOTE — Patient Instructions (Signed)

## 2017-10-28 NOTE — Progress Notes (Signed)
START ON PATHWAY REGIMEN - MDS     A cycle is every 28 days:     Azacitidine   **Always confirm dose/schedule in your pharmacy ordering system**    Patient Characteristics: Higher-Risk (IPSS-R Score > 3.5), First Line, Not a Transplant Candidate WHO Disease Classification: MDS-EB1 Bone Marrow Blasts (percent): 5% - 10% Cytogenetic Category: Good Platelets (x 10^9/L): ? 100 Absolute Neutrophil Count (x 10^9/L): ? 0.8 Line of Therapy: First Line IPSS-R Risk Category: Intermediate IPSS-R Risk Score: 4.5 Check here if patient's risk score was calculated prior to the International Prognostic Scoring System-Revised (IPSS-R): false Hemoglobin (g/dl): < 8 Patient Characteristics: Not a Transplant Candidate Intent of Therapy: Non-Curative / Palliative Intent, Discussed with Patient

## 2017-10-29 ENCOUNTER — Inpatient Hospital Stay: Payer: Medicare Other | Attending: Oncology

## 2017-10-29 DIAGNOSIS — N400 Enlarged prostate without lower urinary tract symptoms: Secondary | ICD-10-CM | POA: Insufficient documentation

## 2017-10-29 DIAGNOSIS — E785 Hyperlipidemia, unspecified: Secondary | ICD-10-CM | POA: Insufficient documentation

## 2017-10-29 DIAGNOSIS — E119 Type 2 diabetes mellitus without complications: Secondary | ICD-10-CM | POA: Insufficient documentation

## 2017-10-29 DIAGNOSIS — I10 Essential (primary) hypertension: Secondary | ICD-10-CM | POA: Insufficient documentation

## 2017-10-29 DIAGNOSIS — Z79899 Other long term (current) drug therapy: Secondary | ICD-10-CM | POA: Insufficient documentation

## 2017-10-29 DIAGNOSIS — D630 Anemia in neoplastic disease: Secondary | ICD-10-CM | POA: Insufficient documentation

## 2017-10-29 DIAGNOSIS — D469 Myelodysplastic syndrome, unspecified: Secondary | ICD-10-CM | POA: Insufficient documentation

## 2017-10-29 NOTE — Progress Notes (Signed)
DISCONTINUE ON PATHWAY REGIMEN - MDS     A cycle is every 28 days:     Azacitidine   **Always confirm dose/schedule in your pharmacy ordering system**    REASON: Other Reason PRIOR TREATMENT: [id number]: Azacitidine 75 mg/m2 SQ Days 1-5 q28 Days TREATMENT RESPONSE: Unable to Evaluate  START ON PATHWAY REGIMEN - MDS     A cycle is every 28 days:     Azacitidine   **Always confirm dose/schedule in your pharmacy ordering system**    Patient Characteristics: Higher-Risk (IPSS-R Score > 3.5), First Line, Not a Transplant Candidate WHO Disease Classification: MDS-EB1 Bone Marrow Blasts (percent): 5% - 10% Cytogenetic Category: Good Platelets (x 10^9/L): ? 100 Absolute Neutrophil Count (x 10^9/L): ? 0.8 Line of Therapy: First Line IPSS-R Risk Category: Intermediate IPSS-R Risk Score: 4.5 Check here if patient's risk score was calculated prior to the International Prognostic Scoring System-Revised (IPSS-R): false Hemoglobin (g/dl): < 8 Patient Characteristics: Not a Transplant Candidate Intent of Therapy: Non-Curative / Palliative Intent, Discussed with Patient

## 2017-11-04 ENCOUNTER — Inpatient Hospital Stay (HOSPITAL_BASED_OUTPATIENT_CLINIC_OR_DEPARTMENT_OTHER): Payer: Medicare Other | Admitting: Oncology

## 2017-11-04 ENCOUNTER — Other Ambulatory Visit: Payer: Self-pay

## 2017-11-04 ENCOUNTER — Inpatient Hospital Stay: Payer: Medicare Other

## 2017-11-04 ENCOUNTER — Other Ambulatory Visit: Payer: Self-pay | Admitting: Oncology

## 2017-11-04 ENCOUNTER — Encounter: Payer: Self-pay | Admitting: Oncology

## 2017-11-04 VITALS — BP 144/47 | HR 74 | Temp 98.4°F | Wt 187.4 lb

## 2017-11-04 DIAGNOSIS — D630 Anemia in neoplastic disease: Secondary | ICD-10-CM | POA: Diagnosis not present

## 2017-11-04 DIAGNOSIS — D469 Myelodysplastic syndrome, unspecified: Secondary | ICD-10-CM

## 2017-11-04 DIAGNOSIS — N400 Enlarged prostate without lower urinary tract symptoms: Secondary | ICD-10-CM | POA: Diagnosis not present

## 2017-11-04 DIAGNOSIS — I10 Essential (primary) hypertension: Secondary | ICD-10-CM | POA: Diagnosis not present

## 2017-11-04 DIAGNOSIS — Z79899 Other long term (current) drug therapy: Secondary | ICD-10-CM | POA: Diagnosis not present

## 2017-11-04 DIAGNOSIS — E785 Hyperlipidemia, unspecified: Secondary | ICD-10-CM | POA: Diagnosis not present

## 2017-11-04 DIAGNOSIS — Z7189 Other specified counseling: Secondary | ICD-10-CM

## 2017-11-04 DIAGNOSIS — E119 Type 2 diabetes mellitus without complications: Secondary | ICD-10-CM | POA: Diagnosis not present

## 2017-11-04 LAB — CBC WITH DIFFERENTIAL/PLATELET
BASOS ABS: 0 10*3/uL (ref 0–0.1)
Basophils Relative: 0 %
Eosinophils Absolute: 0 10*3/uL (ref 0–0.7)
Eosinophils Relative: 0 %
HCT: 24.6 % — ABNORMAL LOW (ref 40.0–52.0)
HEMOGLOBIN: 7.7 g/dL — AB (ref 13.0–18.0)
Lymphocytes Relative: 39 %
Lymphs Abs: 1.9 10*3/uL (ref 1.0–3.6)
MCH: 27.9 pg (ref 26.0–34.0)
MCHC: 31.4 g/dL — ABNORMAL LOW (ref 32.0–36.0)
MCV: 88.9 fL (ref 80.0–100.0)
MONO ABS: 0.6 10*3/uL (ref 0.2–1.0)
MONOS PCT: 13 %
NEUTROS PCT: 48 %
Neutro Abs: 2.3 10*3/uL (ref 1.4–6.5)
PLATELETS: 229 10*3/uL (ref 150–440)
RBC: 2.77 MIL/uL — AB (ref 4.40–5.90)
RDW: 20.2 % — ABNORMAL HIGH (ref 11.5–14.5)
WBC: 4.8 10*3/uL (ref 3.8–10.6)

## 2017-11-04 LAB — FERRITIN: Ferritin: 131 ng/mL (ref 24–336)

## 2017-11-04 LAB — IRON AND TIBC
Iron: 140 ug/dL (ref 45–182)
Saturation Ratios: 47 % — ABNORMAL HIGH (ref 17.9–39.5)
TIBC: 301 ug/dL (ref 250–450)
UIBC: 161 ug/dL

## 2017-11-04 LAB — COMPREHENSIVE METABOLIC PANEL
ALK PHOS: 27 U/L — AB (ref 38–126)
ALT: 14 U/L — AB (ref 17–63)
AST: 19 U/L (ref 15–41)
Albumin: 4.1 g/dL (ref 3.5–5.0)
Anion gap: 7 (ref 5–15)
BUN: 22 mg/dL — AB (ref 6–20)
CALCIUM: 8.5 mg/dL — AB (ref 8.9–10.3)
CHLORIDE: 100 mmol/L — AB (ref 101–111)
CO2: 27 mmol/L (ref 22–32)
Creatinine, Ser: 0.64 mg/dL (ref 0.61–1.24)
GFR calc non Af Amer: >60 mL/min (ref >60)
GLUCOSE: 194 mg/dL — AB (ref 65–99)
Potassium: 4 mmol/L (ref 3.5–5.1)
SODIUM: 134 mmol/L — AB (ref 135–145)
Total Bilirubin: 0.8 mg/dL (ref 0.3–1.2)
Total Protein: 7.2 g/dL (ref 6.5–8.1)

## 2017-11-04 LAB — SAMPLE TO BLOOD BANK

## 2017-11-04 MED ORDER — ONDANSETRON HCL 4 MG PO TABS
8.0000 mg | ORAL_TABLET | Freq: Once | ORAL | Status: AC
  Administered 2017-11-04: 8 mg via ORAL
  Filled 2017-11-04: qty 2

## 2017-11-04 MED ORDER — AZACITIDINE CHEMO SQ INJECTION
75.0000 mg/m2 | Freq: Once | INTRAMUSCULAR | Status: AC
  Administered 2017-11-04: 155 mg via SUBCUTANEOUS
  Filled 2017-11-04: qty 6.2

## 2017-11-04 NOTE — Progress Notes (Signed)
Patient here today for follow up.  Patient states no new concerns today  

## 2017-11-04 NOTE — Progress Notes (Signed)
Hematology/Oncology Follow Up Note [facility]  [phone] [phone]  Patient Care Team: Tracie Harrier, MD as PCP - General (Internal Medicine)   Name of the patient: Enrique Manganaro  [id number]  1926-02-17   REASON FOR VISIT  follow-up for management of anemia. HISTORY OF PRESENT ILLNESS Galan Ghee. is a  84 y.o.  male with PMH listed below who was present as a follow-up after hospital admission for management of anemia. Patient was seen during his most recent admission on 08/18/2017. Patient is heart hearing. He has been feeling increased fatigue, dyspnea, and hemoglobin has dropped from baseline 10 to 7 within the past couple of months. Last colonoscopy was done 10 years ago. Outpatient stool occult test is negative. Patient also takes iron pills and stool is always black.  # He has had extensive workup including urinalysis negative for hematuria, normal X52 and folic level. Normal TSH, negative hemolysis workup. Her stool occult  is being negative as well. CT abdomen pelvis reveals no acute process  # BM biopsy on 09/22/2017 showed  hypercellular bone marrow for age with dyspoietic changes, blast 5%, differential includes RAEB-1 vs CMML, favor RAEB-1   INTERVAL HISTORY Camari Wisham. is a 84 y.o. male who has above history reviewed by me today presents for follow up visit for management of MDS. s/p Aranesp 141m x2.  He feels pretty good, can walk 30 minutes.  Denies any chest pain, shortness of breath, abdominal pain.  Review of systems Review of Systems  Constitutional: Positive for malaise/fatigue. Negative for chills, diaphoresis, fever and weight loss.  HENT: Negative for congestion, ear discharge, hearing loss, sinus pain and tinnitus.   Eyes: Negative for blurred vision, photophobia, pain and redness.  Respiratory: Negative for cough, hemoptysis and shortness of breath.   Cardiovascular: Negative for chest pain,  orthopnea and leg swelling.  Gastrointestinal: Negative for constipation, diarrhea, heartburn, nausea and vomiting.  Genitourinary: Negative for dysuria, frequency and urgency.  Musculoskeletal: Negative for back pain, myalgias and neck pain.  Skin: Negative for rash.  Neurological: Negative for dizziness, sensory change, speech change and headaches.  Endo/Heme/Allergies: Negative for environmental allergies. Does not bruise/bleed easily.  Psychiatric/Behavioral: Negative for depression, substance abuse and suicidal ideas.    No Known Allergies   Past Medical History:  Diagnosis Date  . Anemia   . Arthritis   . BPH (benign prostatic hypertrophy)   . Complication of anesthesia    nausea  . [place] 2[place]   . HOH (hard of hearing)    r and L ears-70% loss per pt  . Hyperlipidemia   . Hypertension      Past Surgical History:  Procedure Laterality Date  . APPENDECTOMY    . COLONOSCOPY  09/21/08   1 polyp found, tubular adenoma  . HAND SURGERY    . KNEE SURGERY Left     Social History   Socioeconomic History  . Marital status: Married    Spouse name: Not on file  . Number of children: Not on file  . Years of education: Not on file  . Highest education level: Not on file  Social Needs  . Financial resource strain: Not on file  . Food insecurity - worry: Not on file  . Food insecurity - inability: Not on file  . Transportation needs - medical: Not on file  . Transportation needs - non-medical: Not on file  Occupational History  . Not on file  Tobacco Use  .  Smoking status: Never Smoker  . Smokeless tobacco: Never Used  Substance and Sexual Activity  . Alcohol use: No    Alcohol/week: 0.0 oz  . Drug use: No  . Sexual activity: Not on file  Other Topics Concern  . Not on file  Social History Narrative  . Not on file    Family History  Problem Relation Age of Onset  . Aneurysm Mother   . Heart disease Father   . Cancer Brother         skin  . Heart disease Brother   . Heart disease Brother   . Cancer Sister        skin  . Aneurysm Brother   . Heart disease Brother   . Heart disease Sister   . Heart disease Sister   . COPD Sister   . Diabetes Sister      Current Outpatient Medications:  .  finasteride (PROSCAR) 5 MG tablet, Take 5 mg by mouth daily., Disp: , Rfl:  .  glucose blood (PRECISION QID TEST) test strip, Use 3 (three) times daily. Use as instructed., Disp: , Rfl:  .  lisinopril (PRINIVIL,ZESTRIL) 10 MG tablet, Take 10 mg by mouth daily., Disp: , Rfl:  .  metformin (FORTAMET) 1000 MG (OSM) 24 hr tablet, Take 1,000 mg by mouth 2 (two) times daily with a meal., Disp: , Rfl:  .  Omega-3 Fatty Acids (FISH OIL PO), Take by mouth., Disp: , Rfl:  .  simvastatin (ZOCOR) 40 MG tablet, Take 40 mg by mouth daily., Disp: , Rfl:   Physical exam:  Vitals:   11/04/17 0845  BP: (!) 144/47  Pulse: 74  Temp: 98.4 F (36.9 C)  TempSrc: Tympanic  Weight: 187 lb 6.3 oz (85 kg)   Physical Exam  Constitutional: He is oriented to person, place, and time. No distress.  HENT:  Head: Normocephalic and atraumatic.  Mouth/Throat: Oropharynx is clear and moist. No oropharyngeal exudate.  Eyes: Conjunctivae and EOM are normal. Pupils are equal, round, and reactive to light. Right eye exhibits no discharge. No scleral icterus.  Neck: Normal range of motion. Neck supple. No JVD present.  Cardiovascular: Normal rate, regular rhythm and normal heart sounds.  No murmur heard. Pulmonary/Chest: Effort normal and breath sounds normal. No respiratory distress.  Abdominal: Soft. Bowel sounds are normal. He exhibits no distension and no mass. There is no tenderness. There is no rebound.  Musculoskeletal: Normal range of motion. He exhibits no edema or deformity.  Lymphadenopathy:    He has no cervical adenopathy.  Neurological: He is oriented to person, place, and time. No cranial nerve deficit. Coordination normal.  Skin: Skin is  warm and dry. He is not diaphoretic. No erythema. There is pallor.  Psychiatric: Affect and judgment normal.    CMP Latest Ref Rng & Units 08/18/2017  Glucose 65 - 99 mg/dL 159(H)  BUN 6 - 20 mg/dL 22(H)  Creatinine 0.61 - 1.24 mg/dL 0.58(L)  Sodium 135 - 145 mmol/L 136  Potassium 3.5 - 5.1 mmol/L 3.8  Chloride 101 - 111 mmol/L 103  CO2 22 - 32 mmol/L 26  Calcium 8.9 - 10.3 mg/dL 8.5(L)  Total Protein 6.5 - 8.1 g/dL -  Total Bilirubin 0.3 - 1.2 mg/dL -  Alkaline Phos 38 - 126 U/L -  AST 15 - 41 U/L -  ALT 17 - 63 U/L -   CBC Latest Ref Rng & Units 10/28/2017  WBC 3.8 - 10.6 K/uL 3.9  Hemoglobin 13.0 -  18.0 g/dL 7.8(L)  Hematocrit 40.0 - 52.0 % 24.6(L)  Platelets 150 - 440 K/uL 254   Patient's workup including stool occult was negative. UA negative for hematuria. Normal J74 and folic levels. normal TSH. Negative hemolysis workup Inappropriate normal reticulocyte count.   09/22/2017  Bone marrow biopsy Diagnosis Bone Marrow, Aspirate,Biopsy, and Clot, right iliac - HYPERCELLULAR BONE MARROW FOR AGE WITH DYSPOIETIC CHANGES.- SEE COMMENT. PERIPHERAL BLOOD: - NORMOCYTIC-HYPOCHROMIC ANEMIA. - LEUKOPENIA. Diagnosis Note The bone marrow is hypercellular for age with dyspoietic changes variably involving myeloid cell lines, but with main involvement of the granulocytic/monocytic cell line. This is associated with bone marrow monocytosis and borderline number to slight increase in blastic cells. The latter is mainly seen by immunohistochemistry. The overall changes favor a primary myeloid neoplasm, particularly a myelodysplastic syndrome especially refractory anemia with excess blasts (RAEB-1) or possibly refractory cytopenia with multilineage dysplasia. Consideration was also given to an evolving myelodysplastic/myeloproliferative neoplasm such as chronic myelomonocytic leukemia but the lack of absolute peripheral monocytosis precludes such a diagnosis at this time. Correlation with  cytogenetic studies is recommended. (BNS:ah:ecj 09/24/17) The blastic cells represent 5% of all cells Bone Marrow Flow Cytometry - NO SIGNIFICANT CD34 [place] one testing positive for  ASXL1 D664EE*05 EZH2 Splice site 637-2L>C RUNX1 A838s*56 IPSS -R 4.5, intermediate group. However, he carried 3 of the five high risk gene mutation, which is associated with shorter OS for this group, likely assemble to high risk group. With median survival 1.6 years.   Assessment and plan Patient is a 84 y.o. male presents with symptomatic anemia.  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 Proceed with cycle 1 Azacitadine 48m/m2 Day 1-5 every 28 days.   # Supportive care and possible chemotherapy with hypomethylating agents were discussed with patient and family.  S/P Aranesp 1552m x 2 Goal of care was discussed, is palliative. Patient and his family members understand that this condition is not curable.   #Patient and his family members to questions and all questions answered to satisfaction.  Follow-up in 1 week for assessment after  Azacitadine and Aranesp injection  ZhEarlie ServerMD, PhD Hematology Oncology [facility]- [phone]/02/2018

## 2017-11-04 NOTE — Patient Instructions (Signed)
Azacitidine suspension for injection (subcutaneous use) What is this medicine? AZACITIDINE (ay za SITE i deen) is a chemotherapy drug. This medicine reduces the growth of cancer cells and can suppress the immune system. It is used for treating myelodysplastic syndrome or some types of leukemia. This medicine may be used for other purposes; ask your health care provider or pharmacist if you have questions. COMMON BRAND NAME(S): Vidaza What should I tell my health care provider before I take this medicine? They need to know if you have any of these conditions: -kidney disease -liver disease -liver tumors -an unusual or allergic reaction to azacitidine, mannitol, other medicines, foods, dyes, or preservatives -pregnant or trying to get pregnant -breast-feeding How should I use this medicine? This medicine is for injection under the skin. It is administered in a hospital or clinic by a specially trained health care professional. Talk to your pediatrician regarding the use of this medicine in children. While this drug may be prescribed for selected conditions, precautions do apply. Overdosage: If you think you have taken too much of this medicine contact a poison control center or emergency room at once. NOTE: This medicine is only for you. Do not share this medicine with others. What if I miss a dose? It is important not to miss your dose. Call your doctor or health care professional if you are unable to keep an appointment. What may interact with this medicine? Interactions have not been studied. Give your health care provider a list of all the medicines, herbs, non-prescription drugs, or dietary supplements you use. Also tell them if you smoke, drink alcohol, or use illegal drugs. Some items may interact with your medicine. This list may not describe all possible interactions. Give your health care provider a list of all the medicines, herbs, non-prescription drugs, or dietary supplements you  use. Also tell them if you smoke, drink alcohol, or use illegal drugs. Some items may interact with your medicine. What should I watch for while using this medicine? Visit your doctor for checks on your progress. This drug may make you feel generally unwell. This is not uncommon, as chemotherapy can affect healthy cells as well as cancer cells. Report any side effects. Continue your course of treatment even though you feel ill unless your doctor tells you to stop. In some cases, you may be given additional medicines to help with side effects. Follow all directions for their use. Call your doctor or health care professional for advice if you get a fever, chills or sore throat, or other symptoms of a cold or flu. Do not treat yourself. This drug decreases your body's ability to fight infections. Try to avoid being around people who are sick. This medicine may increase your risk to bruise or bleed. Call your doctor or health care professional if you notice any unusual bleeding. You may need blood work done while you are taking this medicine. Do not become pregnant while taking this medicine and for 6 months after the last dose. Women should inform their doctor if they wish to become pregnant or think they might be pregnant. Men should not father a child while taking this medicine and for 3 months after the last dose. There is a potential for serious side effects to an unborn child. Talk to your health care professional or pharmacist for more information. Do not breast-feed an infant while taking this medicine and for 1 week after the last dose. This medicine may interfere with the ability to have a child.   Talk with your doctor or health care professional if you are concerned about your fertility. What side effects may I notice from receiving this medicine? Side effects that you should report to your doctor or health care professional as soon as possible: -allergic reactions like skin rash, itching or hives,  swelling of the face, lips, or tongue -low blood counts - this medicine may decrease the number of white blood cells, red blood cells and platelets. You may be at increased risk for infections and bleeding. -signs of infection - fever or chills, cough, sore throat, pain passing urine -signs of decreased platelets or bleeding - bruising, pinpoint red spots on the skin, black, tarry stools, blood in the urine -signs of decreased red blood cells - unusually weak or tired, fainting spells, lightheadedness -signs and symptoms of kidney injury like trouble passing urine or change in the amount of urine -signs and symptoms of liver injury like dark yellow or brown urine; general ill feeling or flu-like symptoms; light-colored stools; loss of appetite; nausea; right upper belly pain; unusually weak or tired; yellowing of the eyes or skin Side effects that usually do not require medical attention (report to your doctor or health care professional if they continue or are bothersome): -constipation -diarrhea -nausea, vomiting -pain or redness at the injection site -unusually weak or tired This list may not describe all possible side effects. Call your doctor for medical advice about side effects. You may report side effects to FDA at 1-800-FDA-1088. Where should I keep my medicine? This drug is given in a hospital or clinic and will not be stored at home. NOTE: This sheet is a summary. It may not cover all possible information. If you have questions about this medicine, talk to your doctor, pharmacist, or health care provider.  2018 Elsevier/Gold Standard (2016-09-15 14:37:51)  

## 2017-11-05 ENCOUNTER — Other Ambulatory Visit: Payer: Self-pay | Admitting: *Deleted

## 2017-11-05 ENCOUNTER — Inpatient Hospital Stay: Payer: Medicare Other

## 2017-11-05 VITALS — BP 137/55 | HR 82 | Resp 18

## 2017-11-05 DIAGNOSIS — Z7189 Other specified counseling: Secondary | ICD-10-CM

## 2017-11-05 DIAGNOSIS — D469 Myelodysplastic syndrome, unspecified: Secondary | ICD-10-CM | POA: Diagnosis not present

## 2017-11-05 MED ORDER — AZACITIDINE CHEMO SQ INJECTION
75.0000 mg/m2 | Freq: Once | INTRAMUSCULAR | Status: AC
  Administered 2017-11-05: 155 mg via SUBCUTANEOUS
  Filled 2017-11-05 (×3): qty 6.2

## 2017-11-05 MED ORDER — ONDANSETRON HCL 4 MG PO TABS
8.0000 mg | ORAL_TABLET | Freq: Once | ORAL | Status: AC
  Administered 2017-11-05: 8 mg via ORAL

## 2017-11-05 MED ORDER — ONDANSETRON HCL 4 MG PO TABS: 4.0000 mg | ORAL_TABLET | Freq: Four times a day (QID) | ORAL | 0 refills | Status: DC | PRN

## 2017-11-05 NOTE — Patient Instructions (Signed)
Azacitidine suspension for injection (subcutaneous use) What is this medicine? AZACITIDINE (ay za SITE i deen) is a chemotherapy drug. This medicine reduces the growth of cancer cells and can suppress the immune system. It is used for treating myelodysplastic syndrome or some types of leukemia. This medicine may be used for other purposes; ask your health care provider or pharmacist if you have questions. COMMON BRAND NAME(S): Vidaza What should I tell my health care provider before I take this medicine? They need to know if you have any of these conditions: -kidney disease -liver disease -liver tumors -an unusual or allergic reaction to azacitidine, mannitol, other medicines, foods, dyes, or preservatives -pregnant or trying to get pregnant -breast-feeding How should I use this medicine? This medicine is for injection under the skin. It is administered in a hospital or clinic by a specially trained health care professional. Talk to your pediatrician regarding the use of this medicine in children. While this drug may be prescribed for selected conditions, precautions do apply. Overdosage: If you think you have taken too much of this medicine contact a poison control center or emergency room at once. NOTE: This medicine is only for you. Do not share this medicine with others. What if I miss a dose? It is important not to miss your dose. Call your doctor or health care professional if you are unable to keep an appointment. What may interact with this medicine? Interactions have not been studied. Give your health care provider a list of all the medicines, herbs, non-prescription drugs, or dietary supplements you use. Also tell them if you smoke, drink alcohol, or use illegal drugs. Some items may interact with your medicine. This list may not describe all possible interactions. Give your health care provider a list of all the medicines, herbs, non-prescription drugs, or dietary supplements you  use. Also tell them if you smoke, drink alcohol, or use illegal drugs. Some items may interact with your medicine. What should I watch for while using this medicine? Visit your doctor for checks on your progress. This drug may make you feel generally unwell. This is not uncommon, as chemotherapy can affect healthy cells as well as cancer cells. Report any side effects. Continue your course of treatment even though you feel ill unless your doctor tells you to stop. In some cases, you may be given additional medicines to help with side effects. Follow all directions for their use. Call your doctor or health care professional for advice if you get a fever, chills or sore throat, or other symptoms of a cold or flu. Do not treat yourself. This drug decreases your body's ability to fight infections. Try to avoid being around people who are sick. This medicine may increase your risk to bruise or bleed. Call your doctor or health care professional if you notice any unusual bleeding. You may need blood work done while you are taking this medicine. Do not become pregnant while taking this medicine and for 6 months after the last dose. Women should inform their doctor if they wish to become pregnant or think they might be pregnant. Men should not father a child while taking this medicine and for 3 months after the last dose. There is a potential for serious side effects to an unborn child. Talk to your health care professional or pharmacist for more information. Do not breast-feed an infant while taking this medicine and for 1 week after the last dose. This medicine may interfere with the ability to have a child.   Talk with your doctor or health care professional if you are concerned about your fertility. What side effects may I notice from receiving this medicine? Side effects that you should report to your doctor or health care professional as soon as possible: -allergic reactions like skin rash, itching or hives,  swelling of the face, lips, or tongue -low blood counts - this medicine may decrease the number of white blood cells, red blood cells and platelets. You may be at increased risk for infections and bleeding. -signs of infection - fever or chills, cough, sore throat, pain passing urine -signs of decreased platelets or bleeding - bruising, pinpoint red spots on the skin, black, tarry stools, blood in the urine -signs of decreased red blood cells - unusually weak or tired, fainting spells, lightheadedness -signs and symptoms of kidney injury like trouble passing urine or change in the amount of urine -signs and symptoms of liver injury like dark yellow or brown urine; general ill feeling or flu-like symptoms; light-colored stools; loss of appetite; nausea; right upper belly pain; unusually weak or tired; yellowing of the eyes or skin Side effects that usually do not require medical attention (report to your doctor or health care professional if they continue or are bothersome): -constipation -diarrhea -nausea, vomiting -pain or redness at the injection site -unusually weak or tired This list may not describe all possible side effects. Call your doctor for medical advice about side effects. You may report side effects to FDA at 1-800-FDA-1088. Where should I keep my medicine? This drug is given in a hospital or clinic and will not be stored at home. NOTE: This sheet is a summary. It may not cover all possible information. If you have questions about this medicine, talk to your doctor, pharmacist, or health care provider.  2018 Elsevier/Gold Standard (2016-09-15 14:37:51)  

## 2017-11-08 ENCOUNTER — Inpatient Hospital Stay: Payer: Medicare Other

## 2017-11-08 VITALS — BP 138/61 | HR 88 | Temp 98.7°F | Resp 18

## 2017-11-08 DIAGNOSIS — D469 Myelodysplastic syndrome, unspecified: Secondary | ICD-10-CM

## 2017-11-08 DIAGNOSIS — Z7189 Other specified counseling: Secondary | ICD-10-CM

## 2017-11-08 MED ORDER — AZACITIDINE CHEMO SQ INJECTION
75.0000 mg/m2 | Freq: Once | INTRAMUSCULAR | Status: AC
  Administered 2017-11-08: 155 mg via SUBCUTANEOUS
  Filled 2017-11-08: qty 6.2

## 2017-11-08 MED ORDER — ONDANSETRON HCL 4 MG PO TABS
8.0000 mg | ORAL_TABLET | Freq: Once | ORAL | Status: AC
  Administered 2017-11-08: 8 mg via ORAL
  Filled 2017-11-08: qty 2

## 2017-11-09 ENCOUNTER — Inpatient Hospital Stay: Payer: Medicare Other

## 2017-11-09 VITALS — BP 122/65 | HR 80 | Temp 98.8°F | Resp 18

## 2017-11-09 DIAGNOSIS — Z7189 Other specified counseling: Secondary | ICD-10-CM

## 2017-11-09 DIAGNOSIS — D469 Myelodysplastic syndrome, unspecified: Secondary | ICD-10-CM | POA: Diagnosis not present

## 2017-11-09 MED ORDER — ONDANSETRON HCL 4 MG PO TABS
8.0000 mg | ORAL_TABLET | Freq: Once | ORAL | Status: AC
  Administered 2017-11-09: 8 mg via ORAL
  Filled 2017-11-09: qty 2

## 2017-11-09 MED ORDER — AZACITIDINE CHEMO SQ INJECTION
75.0000 mg/m2 | Freq: Once | INTRAMUSCULAR | Status: AC
  Administered 2017-11-09: 155 mg via SUBCUTANEOUS
  Filled 2017-11-09: qty 6.2

## 2017-11-10 ENCOUNTER — Inpatient Hospital Stay: Payer: Medicare Other

## 2017-11-10 VITALS — BP 123/65 | HR 80 | Temp 97.6°F | Resp 18

## 2017-11-10 DIAGNOSIS — D469 Myelodysplastic syndrome, unspecified: Secondary | ICD-10-CM | POA: Diagnosis not present

## 2017-11-10 DIAGNOSIS — Z7189 Other specified counseling: Secondary | ICD-10-CM

## 2017-11-10 MED ORDER — AZACITIDINE CHEMO SQ INJECTION
75.0000 mg/m2 | Freq: Once | INTRAMUSCULAR | Status: AC
  Administered 2017-11-10: 155 mg via SUBCUTANEOUS
  Filled 2017-11-10 (×2): qty 6.2

## 2017-11-10 MED ORDER — ONDANSETRON HCL 4 MG PO TABS
8.0000 mg | ORAL_TABLET | Freq: Once | ORAL | Status: AC
  Administered 2017-11-10: 8 mg via ORAL
  Filled 2017-11-10: qty 2

## 2017-11-10 NOTE — Patient Instructions (Signed)
Azacitidine suspension for injection (subcutaneous use) What is this medicine? AZACITIDINE (ay za SITE i deen) is a chemotherapy drug. This medicine reduces the growth of cancer cells and can suppress the immune system. It is used for treating myelodysplastic syndrome or some types of leukemia. This medicine may be used for other purposes; ask your health care provider or pharmacist if you have questions. COMMON BRAND NAME(S): Vidaza What should I tell my health care provider before I take this medicine? They need to know if you have any of these conditions: -kidney disease -liver disease -liver tumors -an unusual or allergic reaction to azacitidine, mannitol, other medicines, foods, dyes, or preservatives -pregnant or trying to get pregnant -breast-feeding How should I use this medicine? This medicine is for injection under the skin. It is administered in a hospital or clinic by a specially trained health care professional. Talk to your pediatrician regarding the use of this medicine in children. While this drug may be prescribed for selected conditions, precautions do apply. Overdosage: If you think you have taken too much of this medicine contact a poison control center or emergency room at once. NOTE: This medicine is only for you. Do not share this medicine with others. What if I miss a dose? It is important not to miss your dose. Call your doctor or health care professional if you are unable to keep an appointment. What may interact with this medicine? Interactions have not been studied. Give your health care provider a list of all the medicines, herbs, non-prescription drugs, or dietary supplements you use. Also tell them if you smoke, drink alcohol, or use illegal drugs. Some items may interact with your medicine. This list may not describe all possible interactions. Give your health care provider a list of all the medicines, herbs, non-prescription drugs, or dietary supplements you  use. Also tell them if you smoke, drink alcohol, or use illegal drugs. Some items may interact with your medicine. What should I watch for while using this medicine? Visit your doctor for checks on your progress. This drug may make you feel generally unwell. This is not uncommon, as chemotherapy can affect healthy cells as well as cancer cells. Report any side effects. Continue your course of treatment even though you feel ill unless your doctor tells you to stop. In some cases, you may be given additional medicines to help with side effects. Follow all directions for their use. Call your doctor or health care professional for advice if you get a fever, chills or sore throat, or other symptoms of a cold or flu. Do not treat yourself. This drug decreases your body's ability to fight infections. Try to avoid being around people who are sick. This medicine may increase your risk to bruise or bleed. Call your doctor or health care professional if you notice any unusual bleeding. You may need blood work done while you are taking this medicine. Do not become pregnant while taking this medicine and for 6 months after the last dose. Women should inform their doctor if they wish to become pregnant or think they might be pregnant. Men should not father a child while taking this medicine and for 3 months after the last dose. There is a potential for serious side effects to an unborn child. Talk to your health care professional or pharmacist for more information. Do not breast-feed an infant while taking this medicine and for 1 week after the last dose. This medicine may interfere with the ability to have a child.   Talk with your doctor or health care professional if you are concerned about your fertility. What side effects may I notice from receiving this medicine? Side effects that you should report to your doctor or health care professional as soon as possible: -allergic reactions like skin rash, itching or hives,  swelling of the face, lips, or tongue -low blood counts - this medicine may decrease the number of white blood cells, red blood cells and platelets. You may be at increased risk for infections and bleeding. -signs of infection - fever or chills, cough, sore throat, pain passing urine -signs of decreased platelets or bleeding - bruising, pinpoint red spots on the skin, black, tarry stools, blood in the urine -signs of decreased red blood cells - unusually weak or tired, fainting spells, lightheadedness -signs and symptoms of kidney injury like trouble passing urine or change in the amount of urine -signs and symptoms of liver injury like dark yellow or brown urine; general ill feeling or flu-like symptoms; light-colored stools; loss of appetite; nausea; right upper belly pain; unusually weak or tired; yellowing of the eyes or skin Side effects that usually do not require medical attention (report to your doctor or health care professional if they continue or are bothersome): -constipation -diarrhea -nausea, vomiting -pain or redness at the injection site -unusually weak or tired This list may not describe all possible side effects. Call your doctor for medical advice about side effects. You may report side effects to FDA at 1-800-FDA-1088. Where should I keep my medicine? This drug is given in a hospital or clinic and will not be stored at home. NOTE: This sheet is a summary. It may not cover all possible information. If you have questions about this medicine, talk to your doctor, pharmacist, or health care provider.  2018 Elsevier/Gold Standard (2016-09-15 14:37:51)  

## 2017-11-11 ENCOUNTER — Inpatient Hospital Stay: Payer: Medicare Other

## 2017-11-11 ENCOUNTER — Inpatient Hospital Stay (HOSPITAL_BASED_OUTPATIENT_CLINIC_OR_DEPARTMENT_OTHER): Payer: Medicare Other | Admitting: Oncology

## 2017-11-11 ENCOUNTER — Encounter: Payer: Self-pay | Admitting: Oncology

## 2017-11-11 ENCOUNTER — Other Ambulatory Visit: Payer: Self-pay

## 2017-11-11 VITALS — BP 133/53 | HR 78 | Temp 97.3°F | Wt 184.7 lb

## 2017-11-11 DIAGNOSIS — D630 Anemia in neoplastic disease: Secondary | ICD-10-CM | POA: Diagnosis not present

## 2017-11-11 DIAGNOSIS — D469 Myelodysplastic syndrome, unspecified: Secondary | ICD-10-CM

## 2017-11-11 DIAGNOSIS — Z79899 Other long term (current) drug therapy: Secondary | ICD-10-CM

## 2017-11-11 LAB — COMPREHENSIVE METABOLIC PANEL
ALK PHOS: 26 U/L — AB (ref 38–126)
ALT: 13 U/L — AB (ref 17–63)
ANION GAP: 7 (ref 5–15)
AST: 18 U/L (ref 15–41)
Albumin: 4.1 g/dL (ref 3.5–5.0)
BUN: 22 mg/dL — ABNORMAL HIGH (ref 6–20)
CALCIUM: 8.8 mg/dL — AB (ref 8.9–10.3)
CO2: 27 mmol/L (ref 22–32)
CREATININE: 0.77 mg/dL (ref 0.61–1.24)
Chloride: 99 mmol/L — ABNORMAL LOW (ref 101–111)
GFR calc Af Amer: >60 mL/min (ref >60)
Glucose, Bld: 247 mg/dL — ABNORMAL HIGH (ref 65–99)
Potassium: 4.6 mmol/L (ref 3.5–5.1)
SODIUM: 133 mmol/L — AB (ref 135–145)
TOTAL PROTEIN: 7.2 g/dL (ref 6.5–8.1)
Total Bilirubin: 0.9 mg/dL (ref 0.3–1.2)

## 2017-11-11 LAB — CBC WITH DIFFERENTIAL/PLATELET
BASOS ABS: 0 10*3/uL (ref 0–0.1)
BASOS PCT: 0 %
EOS ABS: 0 10*3/uL (ref 0–0.7)
Eosinophils Relative: 0 %
HEMATOCRIT: 23.4 % — AB (ref 40.0–52.0)
HEMOGLOBIN: 7.2 g/dL — AB (ref 13.0–18.0)
Lymphocytes Relative: 36 %
Lymphs Abs: 1.6 10*3/uL (ref 1.0–3.6)
MCH: 27.5 pg (ref 26.0–34.0)
MCHC: 31 g/dL — ABNORMAL LOW (ref 32.0–36.0)
MCV: 88.7 fL (ref 80.0–100.0)
Monocytes Absolute: 0.7 10*3/uL (ref 0.2–1.0)
Monocytes Relative: 15 %
NEUTROS ABS: 2.2 10*3/uL (ref 1.4–6.5)
NEUTROS PCT: 49 %
Platelets: 222 10*3/uL (ref 150–440)
RBC: 2.63 MIL/uL — AB (ref 4.40–5.90)
RDW: 20.1 % — ABNORMAL HIGH (ref 11.5–14.5)
WBC: 4.5 10*3/uL (ref 3.8–10.6)

## 2017-11-11 MED ORDER — DARBEPOETIN ALFA 150 MCG/0.3ML IJ SOSY
150.0000 ug | PREFILLED_SYRINGE | INTRAMUSCULAR | Status: DC
  Administered 2017-11-11: 150 ug via SUBCUTANEOUS

## 2017-11-11 NOTE — Progress Notes (Signed)
Patient here today for follow up.  Patient states no new concerns today  

## 2017-11-11 NOTE — Progress Notes (Signed)
Hematology/Oncology Follow Up Note [facility]  [phone] [phone]  Patient Care Team: Tracie Harrier, MD as PCP - General (Internal Medicine)   Name of the patient: Paul Pham  [id number]  29-Dec-1925   REASON FOR VISIT  follow-up for management of anemia. HISTORY OF PRESENT ILLNESS Paul Fees. is a  84 y.o.  male with PMH listed below who was present as a follow-up after hospital admission for management of anemia. Patient was seen during his most recent admission on 08/18/2017. Patient is heart hearing. He has been feeling increased fatigue, dyspnea, and hemoglobin has dropped from baseline 10 to 7 within the past couple of months. Last colonoscopy was done 10 years ago. Outpatient stool occult test is negative. Patient also takes iron pills and stool is always black.  # He has had extensive workup including urinalysis negative for hematuria, normal Y18 and folic level. Normal TSH, negative hemolysis workup. Her stool occult  is being negative as well. 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 Patient and his family members understand that this condition is not curable.    INTERVAL HISTORY Paul Scala. is a 84 y.o. male who has above history reviewed by me today presents for follow up visit for management of MDS. He has received cycle 1 Azacitidine. He feels pretty good, can walk 30 minutes, has good appetite.   Denies any chest pain, shortness of breath, abdominal pain.  Review of systems Review of Systems  Constitutional: Positive for malaise/fatigue. Negative for chills, diaphoresis, fever and weight loss.  HENT: Negative for congestion, ear discharge, hearing loss, sinus pain and tinnitus.   Eyes: Negative for blurred  vision, photophobia, pain and redness.  Respiratory: Negative for cough, hemoptysis and shortness of breath.   Cardiovascular: Negative for chest pain, orthopnea and leg swelling.  Gastrointestinal: Negative for constipation, diarrhea, heartburn, nausea and vomiting.  Genitourinary: Negative for dysuria, frequency and urgency.  Musculoskeletal: Negative for back pain, myalgias and neck pain.  Skin: Negative for rash.  Neurological: Negative for dizziness, sensory change, speech change and headaches.  Endo/Heme/Allergies: Negative for environmental allergies. Does not bruise/bleed easily.  Psychiatric/Behavioral: Negative for depression, substance abuse and suicidal ideas.    No Known Allergies   Past Medical History:  Diagnosis Date  . Anemia   . Arthritis   . BPH (benign prostatic hypertrophy)   . Complication of anesthesia    nausea  . Diabetes type 2, controlled ([place])   . HOH (hard of hearing)    r and L ears-70% loss per pt  . Hyperlipidemia   . Hypertension      Past Surgical History:  Procedure Laterality Date  . APPENDECTOMY    . COLONOSCOPY  09/21/08   1 polyp found, tubular adenoma  . HAND SURGERY    . KNEE SURGERY Left     Social History   Socioeconomic History  . Marital status: Married    Spouse name: Not on file  . Number of children: Not on file  . Years of education: Not on file  . Highest education level: Not on file  Social Needs  . Financial resource strain: Not on file  . Food insecurity - worry: Not on file  . Food insecurity - inability: Not on file  . Transportation needs -  medical: Not on file  . Transportation needs - non-medical: Not on file  Occupational History  . Not on file  Tobacco Use  . Smoking status: Never Smoker  . Smokeless tobacco: Never Used  Substance and Sexual Activity  . Alcohol use: No    Alcohol/week: 0.0 oz  . Drug use: No  . Sexual activity: Not on file  Other Topics Concern  . Not on file  Social History  Narrative  . Not on file    Family History  Problem Relation Age of Onset  . Aneurysm Mother   . Heart disease Father   . Cancer Brother        skin  . Heart disease Brother   . Heart disease Brother   . Cancer Sister        skin  . Aneurysm Brother   . Heart disease Brother   . Heart disease Sister   . Heart disease Sister   . COPD Sister   . Diabetes Sister      Current Outpatient Medications:  .  aspirin EC 81 MG tablet, Take 81 mg by mouth daily., Disp: , Rfl:  .  Cranberry (THERACRAN PO), Take by mouth., Disp: , Rfl:  .  cyanocobalamin 1000 MCG tablet, Take 1,000 mcg by mouth daily., Disp: , Rfl:  .  finasteride (PROSCAR) 5 MG tablet, Take 5 mg by mouth daily., Disp: , Rfl:  .  glucose blood (PRECISION QID TEST) test strip, Use 3 (three) times daily. Use as instructed., Disp: , Rfl:  .  lisinopril (PRINIVIL,ZESTRIL) 10 MG tablet, Take 10 mg by mouth daily., Disp: , Rfl:  .  metformin (FORTAMET) 1000 MG (OSM) 24 hr tablet, Take 1,000 mg by mouth 2 (two) times daily with a meal., Disp: , Rfl:  .  Multiple Vitamins-Minerals (CENTRUM SILVER PO), Take by mouth., Disp: , Rfl:  .  Omega-3 Fatty Acids (FISH OIL PO), Take by mouth., Disp: , Rfl:  .  ondansetron (ZOFRAN) 4 MG tablet, Take 1 tablet (4 mg total) by mouth every 6 (six) hours as needed for nausea or vomiting., Disp: 30 tablet, Rfl: 0 .  S[place]-Phytosterols (PROSTATE SR PO), Take by mouth., Disp: , Rfl:  .  simvastatin (ZOCOR) 40 MG tablet, Take 40 mg by mouth daily., Disp: , Rfl:   Physical exam:  Vitals:   11/11/17 0831  BP: (!) 133/53  Pulse: 78  Temp: (!) 97.3 F (36.3 C)  TempSrc: Tympanic  Weight: 184 lb 11.9 oz (83.8 kg)   Physical Exam  Constitutional: He is oriented to person, place, and time. No distress.  HENT:  Head: Normocephalic and atraumatic.  Mouth/Throat: Oropharynx is clear and moist. No oropharyngeal exudate.  Eyes: Conjunctivae and EOM are normal. Pupils are equal, round, and  reactive to light. Right eye exhibits no discharge. No scleral icterus.  Neck: Normal range of motion. Neck supple. No JVD present.  Cardiovascular: Normal rate, regular rhythm and normal heart sounds.  No murmur heard. Pulmonary/Chest: Effort normal and breath sounds normal. No respiratory distress.  Abdominal: Soft. Bowel sounds are normal. He exhibits no distension and no mass. There is no tenderness. There is no rebound.  Musculoskeletal: Normal range of motion. He exhibits no edema or deformity.  Lymphadenopathy:    He has no cervical adenopathy.  Neurological: He is oriented to person, place, and time. No cranial nerve deficit. Coordination normal.  Skin: Skin is warm and dry. He is not diaphoretic. No erythema. There is pallor.  Psychiatric: Affect and judgment normal.    CMP Latest Ref Rng & Units 11/04/2017  Glucose 65 - 99 mg/dL 194(H)  BUN 6 - 20 mg/dL 22(H)  Creatinine 0.61 - 1.24 mg/dL 0.64  Sodium 135 - 145 mmol/L 134(L)  Potassium 3.5 - 5.1 mmol/L 4.0  Chloride 101 - 111 mmol/L 100(L)  CO2 22 - 32 mmol/L 27  Calcium 8.9 - 10.3 mg/dL 8.5(L)  Total Protein 6.5 - 8.1 g/dL 7.2  Total Bilirubin 0.3 - 1.2 mg/dL 0.8  Alkaline Phos 38 - 126 U/L 27(L)  AST 15 - 41 U/L 19  ALT 17 - 63 U/L 14(L)   CBC Latest Ref Rng & Units 11/04/2017  WBC 3.8 - 10.6 K/uL 4.8  Hemoglobin 13.0 - 18.0 g/dL 7.7(L)  Hematocrit 40.0 - 52.0 % 24.6(L)  Platelets 150 - 440 K/uL 229   Patient's workup including stool occult was negative. UA negative for hematuria. Normal X03 and folic levels. normal TSH. Negative hemolysis workup Inappropriate normal reticulocyte count.   09/22/2017  Bone marrow biopsy Diagnosis Bone Marrow, Aspirate,Biopsy, and Clot, right iliac - HYPERCELLULAR BONE MARROW FOR AGE WITH DYSPOIETIC CHANGES.- SEE COMMENT. PERIPHERAL BLOOD: - NORMOCYTIC-HYPOCHROMIC ANEMIA. - LEUKOPENIA. Diagnosis Note The bone marrow is hypercellular for age with dyspoietic changes variably  involving myeloid cell lines, but with main involvement of the granulocytic/monocytic cell line. This is associated with bone marrow monocytosis and borderline number to slight increase in blastic cells. The latter is mainly seen by immunohistochemistry. The overall changes favor a primary myeloid neoplasm, particularly a myelodysplastic syndrome especially refractory anemia with excess blasts (RAEB-1) or possibly refractory cytopenia with multilineage dysplasia. Consideration was also given to an evolving myelodysplastic/myeloproliferative neoplasm such as chronic myelomonocytic leukemia but the lack of absolute peripheral monocytosis precludes such a diagnosis at this time. Correlation with cytogenetic studies is recommended. (BNS:ah:ecj 09/24/17) The blastic cells represent 5% of all cells Bone Marrow Flow Cytometry - NO SIGNIFICANT CD34 [place] one testing positive for  ASXL1 Y333OV*29 EZH2 Splice site 191-6O>M RUNX1 A838s*56 IPSS -R 4.5, intermediate group. 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 With median survival 1.6 years.   Assessment and plan Patient is a 84 y.o. 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 1. 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   # Supportive care: S/P Aranesp 1525m x 2, will give Aranesp 150 mcg today.  #  #Patient and his family members to questions and all questions answered to satisfaction. Follow-up in 2 weeks for another dose of Aranesp.  Lab/MD in 3 weeks for assessment prior to cycle 2 Azacitadine. Patient and family members know to call if any concerns during the interval.   ZhEarlie ServerMD, PhD Hematology OnG[facility] Al[facility] LP[facility]- [phone]/14/2019

## 2017-11-25 ENCOUNTER — Inpatient Hospital Stay: Payer: Medicare Other

## 2017-11-25 ENCOUNTER — Other Ambulatory Visit: Payer: Self-pay | Admitting: *Deleted

## 2017-11-25 VITALS — BP 120/63 | HR 72 | Temp 96.6°F | Resp 19

## 2017-11-25 DIAGNOSIS — T451X5A Adverse effect of antineoplastic and immunosuppressive drugs, initial encounter: Secondary | ICD-10-CM

## 2017-11-25 DIAGNOSIS — D469 Myelodysplastic syndrome, unspecified: Secondary | ICD-10-CM

## 2017-11-25 DIAGNOSIS — D6481 Anemia due to antineoplastic chemotherapy: Secondary | ICD-10-CM

## 2017-11-25 DIAGNOSIS — D630 Anemia in neoplastic disease: Secondary | ICD-10-CM

## 2017-11-25 LAB — CBC WITH DIFFERENTIAL/PLATELET
BASOS ABS: 0 10*3/uL (ref 0–0.1)
Basophils Relative: 0 %
Eosinophils Absolute: 0 10*3/uL (ref 0–0.7)
Eosinophils Relative: 0 %
HCT: 20.3 % — ABNORMAL LOW (ref 40.0–52.0)
Hemoglobin: 6.2 g/dL — ABNORMAL LOW (ref 13.0–18.0)
LYMPHS PCT: 38 %
Lymphs Abs: 1.1 10*3/uL (ref 1.0–3.6)
MCH: 27.6 pg (ref 26.0–34.0)
MCHC: 30.6 g/dL — ABNORMAL LOW (ref 32.0–36.0)
MCV: 90.1 fL (ref 80.0–100.0)
Monocytes Absolute: 0.4 10*3/uL (ref 0.2–1.0)
Monocytes Relative: 14 %
Neutro Abs: 1.4 10*3/uL (ref 1.4–6.5)
Neutrophils Relative %: 48 %
Platelets: 124 10*3/uL — ABNORMAL LOW (ref 150–440)
RBC: 2.25 MIL/uL — AB (ref 4.40–5.90)
RDW: 22.3 % — ABNORMAL HIGH (ref 11.5–14.5)
WBC: 3 10*3/uL — AB (ref 3.8–10.6)

## 2017-11-25 LAB — SAMPLE TO BLOOD BANK

## 2017-11-25 LAB — PREPARE RBC (CROSSMATCH)

## 2017-11-25 MED ORDER — DARBEPOETIN ALFA 150 MCG/0.3ML IJ SOSY
150.0000 ug | PREFILLED_SYRINGE | INTRAMUSCULAR | Status: DC
Start: 2017-11-25 — End: 2017-11-25
  Administered 2017-11-25: 150 ug via SUBCUTANEOUS

## 2017-11-25 NOTE — Progress Notes (Signed)
Patient here for Aranesp injection. Hemoglobin is 6.2. Call placed to On Call MD and orders received for T&S and prepare RBC's for 1 unit of blood tomorrow in [place]. Patient agreeable to that plan. Wife figured his hgb had dropped due to having no energy, fatigued.. Instructed patient not to take his usual walk around the neighborhood today.

## 2017-11-26 ENCOUNTER — Inpatient Hospital Stay: Payer: Medicare Other

## 2017-11-26 DIAGNOSIS — D469 Myelodysplastic syndrome, unspecified: Secondary | ICD-10-CM

## 2017-11-26 DIAGNOSIS — T451X5A Adverse effect of antineoplastic and immunosuppressive drugs, initial encounter: Secondary | ICD-10-CM

## 2017-11-26 DIAGNOSIS — D6481 Anemia due to antineoplastic chemotherapy: Secondary | ICD-10-CM

## 2017-11-26 MED ORDER — SODIUM CHLORIDE 0.9 % IV SOLN
250.0000 mL | Freq: Once | INTRAVENOUS | Status: DC
  Filled 2017-11-26: qty 250

## 2017-11-26 MED ORDER — DIPHENHYDRAMINE HCL 25 MG PO CAPS
25.0000 mg | ORAL_CAPSULE | Freq: Once | ORAL | Status: AC
  Administered 2017-11-26: 25 mg via ORAL
  Filled 2017-11-26: qty 1

## 2017-11-26 MED ORDER — ACETAMINOPHEN 325 MG PO TABS
650.0000 mg | ORAL_TABLET | Freq: Once | ORAL | Status: AC
  Administered 2017-11-26: 650 mg via ORAL
  Filled 2017-11-26: qty 2

## 2017-11-26 NOTE — Patient Instructions (Signed)

## 2017-11-27 LAB — BPAM RBC
Blood Product Expiration Date: 201904112359
ISSUE DATE / TIME: 201903291126
Unit Type and Rh: 7300

## 2017-11-27 LAB — TYPE AND SCREEN
ABO/RH(D): B POS
Antibody Screen: NEGATIVE
UNIT DIVISION: 0

## 2017-12-02 ENCOUNTER — Encounter: Payer: Self-pay | Admitting: Oncology

## 2017-12-02 ENCOUNTER — Inpatient Hospital Stay: Payer: Medicare Other | Attending: Oncology | Admitting: Oncology

## 2017-12-02 ENCOUNTER — Inpatient Hospital Stay: Payer: Medicare Other

## 2017-12-02 ENCOUNTER — Other Ambulatory Visit: Payer: Self-pay

## 2017-12-02 VITALS — BP 156/53 | HR 75 | Temp 97.9°F | Wt 188.5 lb

## 2017-12-02 DIAGNOSIS — D630 Anemia in neoplastic disease: Secondary | ICD-10-CM | POA: Diagnosis not present

## 2017-12-02 DIAGNOSIS — D469 Myelodysplastic syndrome, unspecified: Secondary | ICD-10-CM | POA: Insufficient documentation

## 2017-12-02 DIAGNOSIS — D6481 Anemia due to antineoplastic chemotherapy: Secondary | ICD-10-CM | POA: Diagnosis not present

## 2017-12-02 DIAGNOSIS — T451X5A Adverse effect of antineoplastic and immunosuppressive drugs, initial encounter: Secondary | ICD-10-CM | POA: Diagnosis not present

## 2017-12-02 DIAGNOSIS — Z79899 Other long term (current) drug therapy: Secondary | ICD-10-CM | POA: Diagnosis not present

## 2017-12-02 LAB — CBC WITH DIFFERENTIAL/PLATELET
BASOS ABS: 0 10*3/uL (ref 0–0.1)
BASOS PCT: 0 %
EOS ABS: 0 10*3/uL (ref 0–0.7)
EOS PCT: 0 %
HCT: 24.7 % — ABNORMAL LOW (ref 40.0–52.0)
Hemoglobin: 7.7 g/dL — ABNORMAL LOW (ref 13.0–18.0)
Lymphocytes Relative: 32 %
Lymphs Abs: 1.4 10*3/uL (ref 1.0–3.6)
MCH: 28.5 pg (ref 26.0–34.0)
MCHC: 31.3 g/dL — AB (ref 32.0–36.0)
MCV: 91.2 fL (ref 80.0–100.0)
MONO ABS: 0.5 10*3/uL (ref 0.2–1.0)
Monocytes Relative: 11 %
NEUTROS ABS: 2.5 10*3/uL (ref 1.4–6.5)
Neutrophils Relative %: 57 %
PLATELETS: 242 10*3/uL (ref 150–440)
RBC: 2.71 MIL/uL — ABNORMAL LOW (ref 4.40–5.90)
RDW: 21.2 % — AB (ref 11.5–14.5)
WBC: 4.3 10*3/uL (ref 3.8–10.6)

## 2017-12-02 LAB — COMPREHENSIVE METABOLIC PANEL
ALBUMIN: 4.1 g/dL (ref 3.5–5.0)
ALK PHOS: 23 U/L — AB (ref 38–126)
ALT: 16 U/L — AB (ref 17–63)
ANION GAP: 7 (ref 5–15)
AST: 20 U/L (ref 15–41)
BILIRUBIN TOTAL: 0.6 mg/dL (ref 0.3–1.2)
BUN: 20 mg/dL (ref 6–20)
CALCIUM: 8.8 mg/dL — AB (ref 8.9–10.3)
CO2: 26 mmol/L (ref 22–32)
CREATININE: 0.6 mg/dL — AB (ref 0.61–1.24)
Chloride: 103 mmol/L (ref 101–111)
GFR calc Af Amer: >60 mL/min (ref >60)
GFR calc non Af Amer: >60 mL/min (ref >60)
GLUCOSE: 184 mg/dL — AB (ref 65–99)
Potassium: 4.3 mmol/L (ref 3.5–5.1)
Sodium: 136 mmol/L (ref 135–145)
TOTAL PROTEIN: 7 g/dL (ref 6.5–8.1)

## 2017-12-02 NOTE — Progress Notes (Signed)
Hematology/Oncology Follow Up Note [facility]  Telephone:([phone] Fax:([phone]  Patient Care Team: Tracie Harrier, MD as PCP - General (Internal Medicine)   Name of the patient: Paul Pham  [id number]  [birth year]   REASON FOR VISIT  follow-up for management of anemia. HISTORY OF PRESENT ILLNESS [name]. is a  84 y.o.  male with PMH listed below who was present as a follow-up after hospital admission for management of anemia. Patient was seen during his most recent admission on 08/18/2017. Patient is heart hearing. He has been feeling increased fatigue, dyspnea, and hemoglobin has dropped from baseline 10 to 7 within the past couple of months. Last colonoscopy was done 10 years ago. Outpatient stool occult test is negative. Patient also takes iron pills and stool is always black.  # He has had extensive workup including urinalysis negative for hematuria, normal H67 and folic level. Normal TSH, negative hemolysis workup. Her stool occult  is being negative as well. 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 Patient and his family members understand that this condition is not curable.   Treatment:  Azacitidine 75 mg/m2  Day 1-5 of every 28 days cycle.    INTERVAL HISTORY [name]. is a 84 y.o. male who has above history reviewed by me today presents for follow up visit for management of MDS. He s/p cycle 1 Azacitidine.  Patient reports feeling well, except mild fatigue with exertion.  has extremely good appetite.  Denies any chest pain, shortness of breath, abdominal pain, nausea or vomiting..  Review of systems Review of Systems  Constitutional: Positive for malaise/fatigue. Negative for chills, diaphoresis, fever and weight loss.    HENT: Negative for congestion, ear discharge, hearing loss, sinus pain and tinnitus.   Eyes: Negative for blurred vision, photophobia, pain and redness.  Respiratory: Negative for cough and hemoptysis.        Shortness of breath with exertion  Cardiovascular: Negative for chest pain, orthopnea and leg swelling.  Gastrointestinal: Negative for constipation, diarrhea, heartburn, nausea and vomiting.  Genitourinary: Negative for dysuria, frequency and urgency.  Musculoskeletal: Negative for back pain, myalgias and neck pain.  Skin: Negative for rash.  Neurological: Negative for dizziness, sensory change, speech change and headaches.  Endo/Heme/Allergies: Negative for environmental allergies. Does not bruise/bleed easily.  Psychiatric/Behavioral: Negative for depression, hallucinations and suicidal ideas.    No Known Allergies   Past Medical History:  Diagnosis Date  . Anemia   . Arthritis   . BPH (benign prostatic hypertrophy)   . Complication of anesthesia    nausea  . Diabetes type 2, controlled ([place])   . HOH (hard of hearing)    r and L ears-70% loss per pt  . Hyperlipidemia   . Hypertension      Past Surgical History:  Procedure Laterality Date  . APPENDECTOMY    . COLONOSCOPY  09/21/08   1 polyp found, tubular adenoma  . HAND SURGERY    . KNEE SURGERY Left     Social History   Socioeconomic History  . Marital status: Married    Spouse name: Not on file  . Number of children: Not on file  . Years of education: Not on file  . Highest education level: Not on file  Occupational History  .  Not on file  Social Needs  . Financial resource strain: Not on file  . Food insecurity:    Worry: Not on file    Inability: Not on file  . Transportation needs:    Medical: Not on file    Non-medical: Not on file  Tobacco Use  . Smoking status: Never Smoker  . Smokeless tobacco: Never Used  Substance and Sexual Activity  . Alcohol use: No    Alcohol/week: 0.0 oz  .  Drug use: No  . Sexual activity: Not on file  Lifestyle  . Physical activity:    Days per week: Not on file    Minutes per session: Not on file  . Stress: Not on file  Relationships  . Social connections:    Talks on phone: Not on file    Gets together: Not on file    Attends religious service: Not on file    Active member of club or organization: Not on file    Attends meetings of clubs or organizations: Not on file    Relationship status: Not on file  . Intimate partner violence:    Fear of current or ex partner: Not on file    Emotionally abused: Not on file    Physically abused: Not on file    Forced sexual activity: Not on file  Other Topics Concern  . Not on file  Social History Narrative  . Not on file    Family History  Problem Relation Age of Onset  . Aneurysm Mother   . Heart disease Father   . Cancer Brother        skin  . Heart disease Brother   . Heart disease Brother   . Cancer Sister        skin  . Aneurysm Brother   . Heart disease Brother   . Heart disease Sister   . Heart disease Sister   . COPD Sister   . Diabetes Sister      Current Outpatient Medications:  .  aspirin EC 81 MG tablet, Take 81 mg by mouth daily., Disp: , Rfl:  .  Cranberry (THERACRAN PO), Take by mouth., Disp: , Rfl:  .  cyanocobalamin 1000 MCG tablet, Take 1,000 mcg by mouth daily., Disp: , Rfl:  .  finasteride (PROSCAR) 5 MG tablet, Take 5 mg by mouth daily., Disp: , Rfl:  .  glucose blood (PRECISION QID TEST) test strip, Use 3 (three) times daily. Use as instructed., Disp: , Rfl:  .  lisinopril (PRINIVIL,ZESTRIL) 10 MG tablet, Take 10 mg by mouth daily., Disp: , Rfl:  .  metformin (FORTAMET) 1000 MG (OSM) 24 hr tablet, Take 1,000 mg by mouth 2 (two) times daily with a meal., Disp: , Rfl:  .  Multiple Vitamins-Minerals (CENTRUM SILVER PO), Take by mouth., Disp: , Rfl:  .  Omega-3 Fatty Acids (FISH OIL PO), Take by mouth., Disp: , Rfl:  .  ondansetron (ZOFRAN) 4 MG tablet,  Take 1 tablet (4 mg total) by mouth every 6 (six) hours as needed for nausea or vomiting., Disp: 30 tablet, Rfl: 0 .  Polyethylene Glycol 3350 (MIRALAX PO), Take by mouth., Disp: , Rfl:  .  Saw Palmetto-Phytosterols (PROSTATE SR PO), Take by mouth., Disp: , Rfl:  .  simvastatin (ZOCOR) 40 MG tablet, Take 40 mg by mouth daily., Disp: , Rfl:   Physical exam:  Vitals:   12/02/17 0843  BP: (!) 156/53  Pulse: 75  Temp: 97.9 F (36.6 C)  TempSrc: Tympanic  Weight: 188 lb 7.9 oz (85.5 kg)   Physical Exam  Constitutional: He is oriented to person, place, and time. No distress.  HENT:  Head: Normocephalic and atraumatic.  Mouth/Throat: Oropharynx is clear and moist. No oropharyngeal exudate.  Eyes: Pupils are equal, round, and reactive to light. Conjunctivae and EOM are normal. Right eye exhibits no discharge. No scleral icterus.  Neck: Normal range of motion. Neck supple. No JVD present.  Cardiovascular: Normal rate, regular rhythm and normal heart sounds.  No murmur heard. Pulmonary/Chest: Effort normal and breath sounds normal. No respiratory distress.  Abdominal: Soft. Bowel sounds are normal. He exhibits no distension and no mass. There is no tenderness. There is no rebound.  Musculoskeletal: Normal range of motion. He exhibits no edema or deformity.  Lymphadenopathy:    He has no cervical adenopathy.  Neurological: He is oriented to person, place, and time. No cranial nerve deficit. Coordination normal.  Skin: Skin is warm and dry. He is not diaphoretic. No erythema. There is pallor.  Psychiatric: Affect and judgment normal.    CMP Latest Ref Rng & Units 11/11/2017  Glucose 65 - 99 mg/dL 247(H)  BUN 6 - 20 mg/dL 22(H)  Creatinine 0.61 - 1.24 mg/dL 0.77  Sodium 135 - 145 mmol/L 133(L)  Potassium 3.5 - 5.1 mmol/L 4.6  Chloride 101 - 111 mmol/L 99(L)  CO2 22 - 32 mmol/L 27  Calcium 8.9 - 10.3 mg/dL 8.8(L)  Total Protein 6.5 - 8.1 g/dL 7.2  Total Bilirubin 0.3 - 1.2 mg/dL 0.9    Alkaline Phos 38 - 126 U/L 26(L)  AST 15 - 41 U/L 18  ALT 17 - 63 U/L 13(L)   CBC Latest Ref Rng & Units 12/02/2017  WBC 3.8 - 10.6 K/uL 4.3  Hemoglobin 13.0 - 18.0 g/dL 7.7(L)  Hematocrit 40.0 - 52.0 % 24.7(L)  Platelets 150 - 440 K/uL 242   Patient's workup including stool occult was negative. UA negative for hematuria. Normal F57 and folic levels. normal TSH. Negative hemolysis workup Inappropriate normal reticulocyte count.   09/22/2017  Bone marrow biopsy Diagnosis Bone Marrow, Aspirate,Biopsy, and Clot, right iliac - HYPERCELLULAR BONE MARROW FOR AGE WITH DYSPOIETIC CHANGES.- SEE COMMENT. PERIPHERAL BLOOD: - NORMOCYTIC-HYPOCHROMIC ANEMIA. - LEUKOPENIA. Diagnosis Note The bone marrow is hypercellular for age with dyspoietic changes variably involving myeloid cell lines, but with main involvement of the granulocytic/monocytic cell line. This is associated with bone marrow monocytosis and borderline number to slight increase in blastic cells. The latter is mainly seen by immunohistochemistry. The overall changes favor a primary myeloid neoplasm, particularly a myelodysplastic syndrome especially refractory anemia with excess blasts (RAEB-1) or possibly refractory cytopenia with multilineage dysplasia. Consideration was also given to an evolving myelodysplastic/myeloproliferative neoplasm such as chronic myelomonocytic leukemia but the lack of absolute peripheral monocytosis precludes such a diagnosis at this time. Correlation with cytogenetic studies is recommended. (BNS:ah:ecj 09/24/17) The blastic cells represent 5% of all cells Bone Marrow Flow Cytometry - NO SIGNIFICANT CD34 [place] one testing positive for  ASXL1 D220UR*42 EZH2 Splice site 706-2B>J RUNX1 A838s*56 IPSS -R 4.5, intermediate group. 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 With median  survival 1.6 years.   Assessment and plan Patient is a 84 y.o. 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 1. MDS (myelodysplastic syndrome) ([place])   2. Antineoplastic chemotherapy induced anemia   3. Anemia in neoplastic  disease    Tolerated cycle 1 Azacitadine 14m/m2 Day 1-5 every 28 days.   # Supportive care: Aranesp 151m every 2 weeks.  Hemoglobin is 7.7 today.  No need for transfusion at this point. #Plan starting cycle 2 of azacitidine 12/06/17- 12/10/17, day 1-5, will also receive his Aranesp on 12/10/17, and 12/24/17   #Patient and his family members to questions and all questions answered to satisfaction.  Lab/MD in 4 weeks for assessment prior to cycle 3 Azacitadine. Patient and family members know to call if any concerns during the interval.   ZhEarlie ServerMD, PhD Hematology [facility]- [phone]/11/2017

## 2017-12-02 NOTE — Progress Notes (Signed)
Patient here today for follow up.  Patient states no new concerns today  

## 2017-12-06 ENCOUNTER — Inpatient Hospital Stay: Payer: Medicare Other

## 2017-12-06 VITALS — BP 150/64 | HR 72 | Temp 97.2°F | Resp 18 | Wt 188.9 lb

## 2017-12-06 DIAGNOSIS — D469 Myelodysplastic syndrome, unspecified: Secondary | ICD-10-CM | POA: Diagnosis not present

## 2017-12-06 DIAGNOSIS — Z7189 Other specified counseling: Secondary | ICD-10-CM

## 2017-12-06 MED ORDER — ONDANSETRON HCL 4 MG PO TABS
8.0000 mg | ORAL_TABLET | Freq: Once | ORAL | Status: AC
Start: 2017-12-06 — End: 2017-12-06
  Administered 2017-12-06: 8 mg via ORAL
  Filled 2017-12-06: qty 2

## 2017-12-06 MED ORDER — AZACITIDINE CHEMO SQ INJECTION
75.0000 mg/m2 | Freq: Once | INTRAMUSCULAR | Status: AC
  Administered 2017-12-06: 155 mg via SUBCUTANEOUS
  Filled 2017-12-06: qty 6.2

## 2017-12-06 NOTE — Progress Notes (Signed)
Patient here for [place] treatment. Labs done on 12/02/17. MD aware hgb is 7.7. Patient received aranesp on 12/02/17. Orders to proceed with [place] treatment

## 2017-12-06 NOTE — Patient Instructions (Signed)
Azacitidine suspension for injection (subcutaneous use) What is this medicine? AZACITIDINE (ay za SITE i deen) is a chemotherapy drug. This medicine reduces the growth of cancer cells and can suppress the immune system. It is used for treating myelodysplastic syndrome or some types of leukemia. This medicine may be used for other purposes; ask your health care provider or pharmacist if you have questions. COMMON BRAND NAME(S): Vidaza What should I tell my health care provider before I take this medicine? They need to know if you have any of these conditions: -kidney disease -liver disease -liver tumors -an unusual or allergic reaction to azacitidine, mannitol, other medicines, foods, dyes, or preservatives -pregnant or trying to get pregnant -breast-feeding How should I use this medicine? This medicine is for injection under the skin. It is administered in a hospital or clinic by a specially trained health care professional. Talk to your pediatrician regarding the use of this medicine in children. While this drug may be prescribed for selected conditions, precautions do apply. Overdosage: If you think you have taken too much of this medicine contact a poison control center or emergency room at once. NOTE: This medicine is only for you. Do not share this medicine with others. What if I miss a dose? It is important not to miss your dose. Call your doctor or health care professional if you are unable to keep an appointment. What may interact with this medicine? Interactions have not been studied. Give your health care provider a list of all the medicines, herbs, non-prescription drugs, or dietary supplements you use. Also tell them if you smoke, drink alcohol, or use illegal drugs. Some items may interact with your medicine. This list may not describe all possible interactions. Give your health care provider a list of all the medicines, herbs, non-prescription drugs, or dietary supplements you  use. Also tell them if you smoke, drink alcohol, or use illegal drugs. Some items may interact with your medicine. What should I watch for while using this medicine? Visit your doctor for checks on your progress. This drug may make you feel generally unwell. This is not uncommon, as chemotherapy can affect healthy cells as well as cancer cells. Report any side effects. Continue your course of treatment even though you feel ill unless your doctor tells you to stop. In some cases, you may be given additional medicines to help with side effects. Follow all directions for their use. Call your doctor or health care professional for advice if you get a fever, chills or sore throat, or other symptoms of a cold or flu. Do not treat yourself. This drug decreases your body's ability to fight infections. Try to avoid being around people who are sick. This medicine may increase your risk to bruise or bleed. Call your doctor or health care professional if you notice any unusual bleeding. You may need blood work done while you are taking this medicine. Do not become pregnant while taking this medicine and for 6 months after the last dose. Women should inform their doctor if they wish to become pregnant or think they might be pregnant. Men should not father a child while taking this medicine and for 3 months after the last dose. There is a potential for serious side effects to an unborn child. Talk to your health care professional or pharmacist for more information. Do not breast-feed an infant while taking this medicine and for 1 week after the last dose. This medicine may interfere with the ability to have a child.   Talk with your doctor or health care professional if you are concerned about your fertility. What side effects may I notice from receiving this medicine? Side effects that you should report to your doctor or health care professional as soon as possible: -allergic reactions like skin rash, itching or hives,  swelling of the face, lips, or tongue -low blood counts - this medicine may decrease the number of white blood cells, red blood cells and platelets. You may be at increased risk for infections and bleeding. -signs of infection - fever or chills, cough, sore throat, pain passing urine -signs of decreased platelets or bleeding - bruising, pinpoint red spots on the skin, black, tarry stools, blood in the urine -signs of decreased red blood cells - unusually weak or tired, fainting spells, lightheadedness -signs and symptoms of kidney injury like trouble passing urine or change in the amount of urine -signs and symptoms of liver injury like dark yellow or brown urine; general ill feeling or flu-like symptoms; light-colored stools; loss of appetite; nausea; right upper belly pain; unusually weak or tired; yellowing of the eyes or skin Side effects that usually do not require medical attention (report to your doctor or health care professional if they continue or are bothersome): -constipation -diarrhea -nausea, vomiting -pain or redness at the injection site -unusually weak or tired This list may not describe all possible side effects. Call your doctor for medical advice about side effects. You may report side effects to FDA at 1-800-FDA-1088. Where should I keep my medicine? This drug is given in a hospital or clinic and will not be stored at home. NOTE: This sheet is a summary. It may not cover all possible information. If you have questions about this medicine, talk to your doctor, pharmacist, or health care provider.  2018 Elsevier/Gold Standard (2016-09-15 14:37:51)  

## 2017-12-07 ENCOUNTER — Inpatient Hospital Stay: Payer: Medicare Other

## 2017-12-07 VITALS — BP 131/56 | HR 73 | Temp 96.9°F | Resp 18 | Wt 188.0 lb

## 2017-12-07 DIAGNOSIS — D469 Myelodysplastic syndrome, unspecified: Secondary | ICD-10-CM | POA: Diagnosis not present

## 2017-12-07 DIAGNOSIS — Z7189 Other specified counseling: Secondary | ICD-10-CM

## 2017-12-07 MED ORDER — AZACITIDINE CHEMO SQ INJECTION
75.0000 mg/m2 | Freq: Once | INTRAMUSCULAR | Status: AC
  Administered 2017-12-07: 155 mg via SUBCUTANEOUS
  Filled 2017-12-07 (×2): qty 6.2

## 2017-12-07 MED ORDER — ONDANSETRON HCL 4 MG PO TABS
8.0000 mg | ORAL_TABLET | Freq: Once | ORAL | Status: AC
  Administered 2017-12-07: 8 mg via ORAL

## 2017-12-08 ENCOUNTER — Inpatient Hospital Stay: Payer: Medicare Other

## 2017-12-08 VITALS — BP 125/62 | HR 78 | Temp 98.5°F | Resp 18

## 2017-12-08 DIAGNOSIS — D469 Myelodysplastic syndrome, unspecified: Secondary | ICD-10-CM

## 2017-12-08 DIAGNOSIS — Z7189 Other specified counseling: Secondary | ICD-10-CM

## 2017-12-08 MED ORDER — ONDANSETRON HCL 4 MG PO TABS
ORAL_TABLET | ORAL | Status: AC
  Filled 2017-12-08: qty 2

## 2017-12-08 MED ORDER — ONDANSETRON HCL 4 MG PO TABS
8.0000 mg | ORAL_TABLET | Freq: Once | ORAL | Status: AC
  Administered 2017-12-08: 8 mg via ORAL

## 2017-12-08 MED ORDER — AZACITIDINE CHEMO SQ INJECTION
75.0000 mg/m2 | Freq: Once | INTRAMUSCULAR | Status: AC
  Administered 2017-12-08: 155 mg via SUBCUTANEOUS
  Filled 2017-12-08: qty 6.2

## 2017-12-09 ENCOUNTER — Inpatient Hospital Stay: Payer: Medicare Other

## 2017-12-09 VITALS — BP 132/62 | HR 73 | Temp 97.8°F | Resp 18

## 2017-12-09 DIAGNOSIS — D469 Myelodysplastic syndrome, unspecified: Secondary | ICD-10-CM

## 2017-12-09 DIAGNOSIS — Z7189 Other specified counseling: Secondary | ICD-10-CM

## 2017-12-09 MED ORDER — AZACITIDINE CHEMO SQ INJECTION
75.0000 mg/m2 | Freq: Once | INTRAMUSCULAR | Status: AC
  Administered 2017-12-09: 155 mg via SUBCUTANEOUS
  Filled 2017-12-09: qty 6.2

## 2017-12-09 MED ORDER — ONDANSETRON HCL 4 MG PO TABS
8.0000 mg | ORAL_TABLET | Freq: Once | ORAL | Status: AC
  Administered 2017-12-09: 8 mg via ORAL

## 2017-12-10 ENCOUNTER — Inpatient Hospital Stay: Payer: Medicare Other

## 2017-12-10 VITALS — BP 130/56 | HR 76 | Temp 97.8°F | Resp 18

## 2017-12-10 DIAGNOSIS — D469 Myelodysplastic syndrome, unspecified: Secondary | ICD-10-CM

## 2017-12-10 DIAGNOSIS — Z7189 Other specified counseling: Secondary | ICD-10-CM

## 2017-12-10 MED ORDER — ONDANSETRON HCL 4 MG PO TABS
8.0000 mg | ORAL_TABLET | Freq: Once | ORAL | Status: AC
  Administered 2017-12-10: 8 mg via ORAL
  Filled 2017-12-10: qty 2

## 2017-12-10 MED ORDER — AZACITIDINE CHEMO SQ INJECTION
75.0000 mg/m2 | Freq: Once | INTRAMUSCULAR | Status: AC
  Administered 2017-12-10: 155 mg via SUBCUTANEOUS
  Filled 2017-12-10: qty 6.2

## 2017-12-10 NOTE — Patient Instructions (Signed)
Azacitidine suspension for injection (subcutaneous use) What is this medicine? AZACITIDINE (ay za SITE i deen) is a chemotherapy drug. This medicine reduces the growth of cancer cells and can suppress the immune system. It is used for treating myelodysplastic syndrome or some types of leukemia. This medicine may be used for other purposes; ask your health care provider or pharmacist if you have questions. COMMON BRAND NAME(S): Vidaza What should I tell my health care provider before I take this medicine? They need to know if you have any of these conditions: -kidney disease -liver disease -liver tumors -an unusual or allergic reaction to azacitidine, mannitol, other medicines, foods, dyes, or preservatives -pregnant or trying to get pregnant -breast-feeding How should I use this medicine? This medicine is for injection under the skin. It is administered in a hospital or clinic by a specially trained health care professional. Talk to your pediatrician regarding the use of this medicine in children. While this drug may be prescribed for selected conditions, precautions do apply. Overdosage: If you think you have taken too much of this medicine contact a poison control center or emergency room at once. NOTE: This medicine is only for you. Do not share this medicine with others. What if I miss a dose? It is important not to miss your dose. Call your doctor or health care professional if you are unable to keep an appointment. What may interact with this medicine? Interactions have not been studied. Give your health care provider a list of all the medicines, herbs, non-prescription drugs, or dietary supplements you use. Also tell them if you smoke, drink alcohol, or use illegal drugs. Some items may interact with your medicine. This list may not describe all possible interactions. Give your health care provider a list of all the medicines, herbs, non-prescription drugs, or dietary supplements you  use. Also tell them if you smoke, drink alcohol, or use illegal drugs. Some items may interact with your medicine. What should I watch for while using this medicine? Visit your doctor for checks on your progress. This drug may make you feel generally unwell. This is not uncommon, as chemotherapy can affect healthy cells as well as cancer cells. Report any side effects. Continue your course of treatment even though you feel ill unless your doctor tells you to stop. In some cases, you may be given additional medicines to help with side effects. Follow all directions for their use. Call your doctor or health care professional for advice if you get a fever, chills or sore throat, or other symptoms of a cold or flu. Do not treat yourself. This drug decreases your body's ability to fight infections. Try to avoid being around people who are sick. This medicine may increase your risk to bruise or bleed. Call your doctor or health care professional if you notice any unusual bleeding. You may need blood work done while you are taking this medicine. Do not become pregnant while taking this medicine and for 6 months after the last dose. Women should inform their doctor if they wish to become pregnant or think they might be pregnant. Men should not father a child while taking this medicine and for 3 months after the last dose. There is a potential for serious side effects to an unborn child. Talk to your health care professional or pharmacist for more information. Do not breast-feed an infant while taking this medicine and for 1 week after the last dose. This medicine may interfere with the ability to have a child.   Talk with your doctor or health care professional if you are concerned about your fertility. What side effects may I notice from receiving this medicine? Side effects that you should report to your doctor or health care professional as soon as possible: -allergic reactions like skin rash, itching or hives,  swelling of the face, lips, or tongue -low blood counts - this medicine may decrease the number of white blood cells, red blood cells and platelets. You may be at increased risk for infections and bleeding. -signs of infection - fever or chills, cough, sore throat, pain passing urine -signs of decreased platelets or bleeding - bruising, pinpoint red spots on the skin, black, tarry stools, blood in the urine -signs of decreased red blood cells - unusually weak or tired, fainting spells, lightheadedness -signs and symptoms of kidney injury like trouble passing urine or change in the amount of urine -signs and symptoms of liver injury like dark yellow or brown urine; general ill feeling or flu-like symptoms; light-colored stools; loss of appetite; nausea; right upper belly pain; unusually weak or tired; yellowing of the eyes or skin Side effects that usually do not require medical attention (report to your doctor or health care professional if they continue or are bothersome): -constipation -diarrhea -nausea, vomiting -pain or redness at the injection site -unusually weak or tired This list may not describe all possible side effects. Call your doctor for medical advice about side effects. You may report side effects to FDA at 1-800-FDA-1088. Where should I keep my medicine? This drug is given in a hospital or clinic and will not be stored at home. NOTE: This sheet is a summary. It may not cover all possible information. If you have questions about this medicine, talk to your doctor, pharmacist, or health care provider.  2018 Elsevier/Gold Standard (2016-09-15 14:37:51)  

## 2017-12-24 ENCOUNTER — Inpatient Hospital Stay: Payer: Medicare Other

## 2017-12-24 VITALS — BP 132/52 | HR 71 | Temp 97.3°F | Resp 18

## 2017-12-24 DIAGNOSIS — D630 Anemia in neoplastic disease: Secondary | ICD-10-CM

## 2017-12-24 DIAGNOSIS — D469 Myelodysplastic syndrome, unspecified: Secondary | ICD-10-CM

## 2017-12-24 LAB — CBC WITH DIFFERENTIAL/PLATELET
Basophils Absolute: 0 10*3/uL (ref 0–0.1)
Basophils Relative: 0 %
EOS PCT: 0 %
Eosinophils Absolute: 0 10*3/uL (ref 0–0.7)
HCT: 22 % — ABNORMAL LOW (ref 40.0–52.0)
Hemoglobin: 6.9 g/dL — ABNORMAL LOW (ref 13.0–18.0)
LYMPHS ABS: 1.3 10*3/uL (ref 1.0–3.6)
LYMPHS PCT: 32 %
MCH: 28.2 pg (ref 26.0–34.0)
MCHC: 31.2 g/dL — AB (ref 32.0–36.0)
MCV: 90.4 fL (ref 80.0–100.0)
MONO ABS: 0.5 10*3/uL (ref 0.2–1.0)
MONOS PCT: 12 %
Neutro Abs: 2.2 10*3/uL (ref 1.4–6.5)
Neutrophils Relative %: 56 %
PLATELETS: 163 10*3/uL (ref 150–440)
RBC: 2.43 MIL/uL — ABNORMAL LOW (ref 4.40–5.90)
RDW: 20.3 % — AB (ref 11.5–14.5)
WBC: 4 10*3/uL (ref 3.8–10.6)

## 2017-12-24 LAB — COMPREHENSIVE METABOLIC PANEL
ALBUMIN: 4.1 g/dL (ref 3.5–5.0)
ALT: 15 U/L — ABNORMAL LOW (ref 17–63)
AST: 20 U/L (ref 15–41)
Alkaline Phosphatase: 24 U/L — ABNORMAL LOW (ref 38–126)
Anion gap: 9 (ref 5–15)
BILIRUBIN TOTAL: 0.6 mg/dL (ref 0.3–1.2)
BUN: 21 mg/dL — AB (ref 6–20)
CHLORIDE: 103 mmol/L (ref 101–111)
CO2: 24 mmol/L (ref 22–32)
Calcium: 8.8 mg/dL — ABNORMAL LOW (ref 8.9–10.3)
Creatinine, Ser: 0.67 mg/dL (ref 0.61–1.24)
GFR calc Af Amer: >60 mL/min (ref >60)
GFR calc non Af Amer: >60 mL/min (ref >60)
GLUCOSE: 183 mg/dL — AB (ref 65–99)
POTASSIUM: 4.2 mmol/L (ref 3.5–5.1)
Sodium: 136 mmol/L (ref 135–145)
TOTAL PROTEIN: 6.8 g/dL (ref 6.5–8.1)

## 2017-12-24 LAB — SAMPLE TO BLOOD BANK

## 2017-12-24 MED ORDER — DARBEPOETIN ALFA 150 MCG/0.3ML IJ SOSY: 150.0000 ug | PREFILLED_SYRINGE | INTRAMUSCULAR | Status: DC

## 2017-12-24 NOTE — Patient Instructions (Signed)

## 2017-12-24 NOTE — Progress Notes (Signed)
Patient's Hemoglobin is 6.9 today.  His medical team was made aware and that hold tube was drawn.Patient expressed that he had repaired his driveway with a load of dirt and grass seed.  He says he doesn't feel like his hemoglobin is low.  Dr. Tasia Catchings made aware that he was not symptomatic.

## 2017-12-30 ENCOUNTER — Inpatient Hospital Stay: Payer: Medicare Other | Attending: Oncology | Admitting: Oncology

## 2017-12-30 ENCOUNTER — Encounter: Payer: Self-pay | Admitting: Oncology

## 2017-12-30 ENCOUNTER — Inpatient Hospital Stay: Payer: Medicare Other

## 2017-12-30 ENCOUNTER — Other Ambulatory Visit: Payer: Self-pay

## 2017-12-30 VITALS — BP 134/50 | HR 78 | Temp 98.5°F | Resp 16 | Wt 185.2 lb

## 2017-12-30 DIAGNOSIS — D649 Anemia, unspecified: Secondary | ICD-10-CM

## 2017-12-30 DIAGNOSIS — D630 Anemia in neoplastic disease: Secondary | ICD-10-CM

## 2017-12-30 DIAGNOSIS — D469 Myelodysplastic syndrome, unspecified: Secondary | ICD-10-CM | POA: Insufficient documentation

## 2017-12-30 DIAGNOSIS — D708 Other neutropenia: Secondary | ICD-10-CM

## 2017-12-30 DIAGNOSIS — T451X5A Adverse effect of antineoplastic and immunosuppressive drugs, initial encounter: Secondary | ICD-10-CM

## 2017-12-30 DIAGNOSIS — D6481 Anemia due to antineoplastic chemotherapy: Secondary | ICD-10-CM

## 2017-12-30 DIAGNOSIS — Z5111 Encounter for antineoplastic chemotherapy: Secondary | ICD-10-CM

## 2017-12-30 DIAGNOSIS — Z79899 Other long term (current) drug therapy: Secondary | ICD-10-CM | POA: Diagnosis not present

## 2017-12-30 LAB — CBC WITH DIFFERENTIAL/PLATELET
BASOS ABS: 0 10*3/uL (ref 0–0.1)
Basophils Relative: 0 %
EOS PCT: 0 %
Eosinophils Absolute: 0 10*3/uL (ref 0–0.7)
HEMATOCRIT: 23.2 % — AB (ref 40.0–52.0)
HEMOGLOBIN: 7 g/dL — AB (ref 13.0–18.0)
LYMPHS PCT: 35 %
Lymphs Abs: 1.3 10*3/uL (ref 1.0–3.6)
MCH: 27.3 pg (ref 26.0–34.0)
MCHC: 30.2 g/dL — ABNORMAL LOW (ref 32.0–36.0)
MCV: 90.3 fL (ref 80.0–100.0)
Monocytes Absolute: 0.4 10*3/uL (ref 0.2–1.0)
Monocytes Relative: 9 %
NEUTROS ABS: 2.2 10*3/uL (ref 1.4–6.5)
Neutrophils Relative %: 56 %
PLATELETS: 229 10*3/uL (ref 150–440)
RBC: 2.57 MIL/uL — AB (ref 4.40–5.90)
RDW: 20.6 % — ABNORMAL HIGH (ref 11.5–14.5)
WBC: 3.9 10*3/uL (ref 3.8–10.6)

## 2017-12-30 NOTE — Progress Notes (Signed)
Patient here today for follow up.  Patient states no new concerns today  

## 2017-12-30 NOTE — Progress Notes (Signed)
Hematology/Oncology Follow Up Note [facility]  Telephone:([phone] Fax:([phone]  Patient Care Team: Tracie Harrier, MD as PCP - General (Internal Medicine)   Name of the patient: Paul Pham  [id number]  [birth year]   REASON FOR VISIT  follow-up for management of anemia. HISTORY OF PRESENT ILLNESS Paul Viets. is a  84 y.o.  male with PMH listed below who was present as a follow-up after hospital admission for management of anemia. Patient was seen during his most recent admission on 08/18/2017. Patient is heart hearing. He has been feeling increased fatigue, dyspnea, and hemoglobin has dropped from baseline 10 to 7 within the past couple of months. Last colonoscopy was done 10 years ago. Outpatient stool occult test is negative. Patient also takes iron pills and stool is always black.  # He has had extensive workup including urinalysis negative for hematuria, normal P23 and folic level. Normal TSH, negative hemolysis workup. Her stool occult  is being negative as well. 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 Patient and his family members understand that this condition is not curable.   Treatment:  Azacitidine 75 mg/m2  Day 1-5 of every 28 days cycle.  Cycle 1 3/7-3/8, 3/11-13 cycle 2  12/06/17- 12/10/17, day 1-5 Cycle 3 plan 5/6 -5/10.   Aranesp 158mg   INTERVAL HISTORY Paul Pham is a 84y.o. male who has above history reviewed by me today presents for follow up visit for management of MDS. He s/p 2 cycles of Azacitidine. He reports doing well at baseline, except mild fatigue with exertion. 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  Denies any chest pain, shortness of breath, abdominal pain,  nausea or vomiting..  Review of systems Review of Systems  Constitutional: Positive for malaise/fatigue. Negative for chills, diaphoresis, fever and weight loss.  HENT: Negative for congestion, ear discharge, ear pain, hearing loss, nosebleeds, sinus pain, sore throat and tinnitus.   Eyes: Negative for blurred vision, double vision, photophobia, pain, discharge and redness.  Respiratory: Negative for cough, hemoptysis, sputum production, shortness of breath and wheezing.        Shortness of breath with exertion  Cardiovascular: Negative for chest pain, palpitations, orthopnea, claudication and leg swelling.  Gastrointestinal: Negative for abdominal pain, blood in stool, constipation, diarrhea, heartburn, melena, nausea and vomiting.  Genitourinary: Negative for dysuria, flank pain, frequency, hematuria and urgency.  Musculoskeletal: Negative for back pain, myalgias and neck pain.  Skin: Negative for itching and rash.  Neurological: Negative for dizziness, tingling, tremors, sensory change, speech change, focal weakness, weakness and headaches.  Endo/Heme/Allergies: Negative for environmental allergies. Does not bruise/bleed easily.  Psychiatric/Behavioral: Negative for depression, hallucinations, substance abuse and suicidal ideas. The patient is not nervous/anxious.     No Known Allergies   Past Medical History:  Diagnosis Date  . Anemia   . Arthritis   . BPH (benign prostatic hypertrophy)   . Complication of anesthesia    nausea  . Diabetes type 2, controlled ([place]   . HOH (hard of hearing)    r and L ears-70% loss per pt  . Hyperlipidemia   . Hypertension      Past Surgical History:  Procedure Laterality Date  . APPENDECTOMY    .  COLONOSCOPY  09/21/08   1 polyp found, tubular adenoma  . HAND SURGERY    . KNEE SURGERY Left     Social History   Socioeconomic History  . Marital status: Married    Spouse name: Not on file  . Number of children: Not on file  . Years  of education: Not on file  . Highest education level: Not on file  Occupational History  . Not on file  Social Needs  . Financial resource strain: Not on file  . Food insecurity:    Worry: Not on file    Inability: Not on file  . Transportation needs:    Medical: Not on file    Non-medical: Not on file  Tobacco Use  . Smoking status: Never Smoker  . Smokeless tobacco: Never Used  Substance and Sexual Activity  . Alcohol use: No    Alcohol/week: 0.0 oz  . Drug use: No  . Sexual activity: Not on file  Lifestyle  . Physical activity:    Days per week: Not on file    Minutes per session: Not on file  . Stress: Not on file  Relationships  . Social connections:    Talks on phone: Not on file    Gets together: Not on file    Attends religious service: Not on file    Active member of club or organization: Not on file    Attends meetings of clubs or organizations: Not on file    Relationship status: Not on file  . Intimate partner violence:    Fear of current or ex partner: Not on file    Emotionally abused: Not on file    Physically abused: Not on file    Forced sexual activity: Not on file  Other Topics Concern  . Not on file  Social History Narrative  . Not on file    Family History  Problem Relation Age of Onset  . Aneurysm Mother   . Heart disease Father   . Cancer Brother        skin  . Heart disease Brother   . Heart disease Brother   . Cancer Sister        skin  . Aneurysm Brother   . Heart disease Brother   . Heart disease Sister   . Heart disease Sister   . COPD Sister   . Diabetes Sister      Current Outpatient Medications:  .  aspirin EC 81 MG tablet, Take 81 mg by mouth daily., Disp: , Rfl:  .  Cranberry (THERACRAN PO), Take by mouth., Disp: , Rfl:  .  cyanocobalamin 1000 MCG tablet, Take 1,000 mcg by mouth daily., Disp: , Rfl:  .  finasteride (PROSCAR) 5 MG tablet, Take 5 mg by mouth daily., Disp: , Rfl:  .  glucose blood (PRECISION QID TEST)  test strip, Use 3 (three) times daily. Use as instructed., Disp: , Rfl:  .  lisinopril (PRINIVIL,ZESTRIL) 10 MG tablet, Take 10 mg by mouth daily., Disp: , Rfl:  .  metformin (FORTAMET) 1000 MG (OSM) 24 hr tablet, Take 1,000 mg by mouth 2 (two) times daily with a meal., Disp: , Rfl:  .  Multiple Vitamins-Minerals (CENTRUM SILVER PO), Take by mouth., Disp: , Rfl:  .  Omega-3 Fatty Acids (FISH OIL PO), Take by mouth., Disp: , Rfl:  .  ondansetron (ZOFRAN) 4 MG tablet, Take 1 tablet (4 mg total) by mouth every 6 (six) hours as needed for nausea or  vomiting., Disp: 30 tablet, Rfl: 0 .  Polyethylene Glycol 3350 (MIRALAX PO), Take by mouth., Disp: , Rfl:  .  Saw Palmetto-Phytosterols (PROSTATE SR PO), Take by mouth., Disp: , Rfl:  .  simvastatin (ZOCOR) 40 MG tablet, Take 40 mg by mouth daily., Disp: , Rfl:   Physical exam:  Vitals:   12/30/17 1031  BP: (!) 134/50  Pulse: 78  Resp: 16  Temp: 98.5 F (36.9 C)  TempSrc: Tympanic  Weight: 185 lb 3 oz (84 kg)   Physical Exam  Constitutional: He is oriented to person, place, and time and well-developed, well-nourished, and in no distress. No distress.  HENT:  Head: Normocephalic and atraumatic.  Nose: Nose normal.  Mouth/Throat: Oropharynx is clear and moist. No oropharyngeal exudate.  Eyes: Pupils are equal, round, and reactive to light. Conjunctivae and EOM are normal. Right eye exhibits no discharge. Left eye exhibits no discharge. No scleral icterus.  Neck: Normal range of motion. Neck supple. No JVD present.  Cardiovascular: Normal rate, regular rhythm and normal heart sounds.  No murmur heard. Pulmonary/Chest: Effort normal and breath sounds normal. No respiratory distress. He has no wheezes. He has no rales. He exhibits no tenderness.  Abdominal: Soft. Bowel sounds are normal. He exhibits no distension and no mass. There is no tenderness. There is no rebound.  Musculoskeletal: Normal range of motion. He exhibits no edema, tenderness or  deformity.  Lymphadenopathy:    He has no cervical adenopathy.  Neurological: He is alert and oriented to person, place, and time. No cranial nerve deficit. He exhibits normal muscle tone. Coordination normal.  Skin: Skin is warm and dry. No rash noted. He is not diaphoretic. No erythema. There is pallor.  Psychiatric: Affect and judgment normal.    CMP Latest Ref Rng & Units 12/24/2017  Glucose 65 - 99 mg/dL 183(H)  BUN 6 - 20 mg/dL 21(H)  Creatinine 0.61 - 1.24 mg/dL 0.67  Sodium 135 - 145 mmol/L 136  Potassium 3.5 - 5.1 mmol/L 4.2  Chloride 101 - 111 mmol/L 103  CO2 22 - 32 mmol/L 24  Calcium 8.9 - 10.3 mg/dL 8.8(L)  Total Protein 6.5 - 8.1 g/dL 6.8  Total Bilirubin 0.3 - 1.2 mg/dL 0.6  Alkaline Phos 38 - 126 U/L 24(L)  AST 15 - 41 U/L 20  ALT 17 - 63 U/L 15(L)   CBC Latest Ref Rng & Units 12/30/2017  WBC 3.8 - 10.6 K/uL 3.9  Hemoglobin 13.0 - 18.0 g/dL 7.0(L)  Hematocrit 40.0 - 52.0 % 23.2(L)  Platelets 150 - 440 K/uL 229   Patient's workup including stool occult was negative. UA negative for hematuria. Normal B28 and folic levels. normal TSH. Negative hemolysis workup Inappropriate normal reticulocyte count.   09/22/2017  Bone marrow biopsy Diagnosis Bone Marrow, Aspirate,Biopsy, and Clot, right iliac - HYPERCELLULAR BONE MARROW FOR AGE WITH DYSPOIETIC CHANGES.- SEE COMMENT. PERIPHERAL BLOOD: - NORMOCYTIC-HYPOCHROMIC ANEMIA. - LEUKOPENIA. Diagnosis Note The bone marrow is hypercellular for age with dyspoietic changes variably involving myeloid cell lines, but with main involvement of the granulocytic/monocytic cell line. This is associated with bone marrow monocytosis and borderline number to slight increase in blastic cells. The latter is mainly seen by immunohistochemistry. The overall changes favor a primary myeloid neoplasm, particularly a myelodysplastic syndrome especially refractory anemia with excess blasts (RAEB-1) or possibly refractory cytopenia with multilineage  dysplasia. Consideration was also given to an evolving myelodysplastic/myeloproliferative neoplasm such as chronic myelomonocytic leukemia but the lack of absolute peripheral monocytosis precludes such  a diagnosis at this time. Correlation with cytogenetic studies is recommended. (BNS:ah:ecj 09/24/17) The blastic cells represent 5% of all cells Bone Marrow Flow Cytometry - NO SIGNIFICANT CD34 [place] one testing positive for  ASXL1 M158EW*25 EZH2 Splice site 749-3X>L RUNX1 A838s*56 IPSS -R 4.5, intermediate group. 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 With median survival 1.6 years.   Assessment and plan Patient is a 84 y.o. 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 1. MDS (myelodysplastic syndrome) ([place])   2. Antineoplastic chemotherapy induced anemia   3. Anemia in neoplastic disease   4. Other neutropenia ([place])   5. Encounter for antineoplastic chemotherapy    # MDS: Tolerated 2 cycles of Azacitadine 33m/m2 Day 1-5 every 28 days. Proceed with cycle 3 next week 5/6-5/10.     # Anemia secondary to MDS and chemotherapy: Hemoglobin 7 today. Increase Aranesp to 3029m every 2 weeks. He will receive on 01/07/2018 and 01/21/2018.  No need for transfusion at this point.  #Patient and his family members to questions and all questions answered to satisfaction.  Lab/MD in 4 weeks for assessment prior to cycle 4 Azacitadine. Patient and family members know to call if any concerns during the interval.   Paul ServerMD, PhD Hematology [facility]- [phone]/10/2017

## 2018-01-03 ENCOUNTER — Inpatient Hospital Stay: Payer: Medicare Other

## 2018-01-03 VITALS — BP 111/58 | HR 58 | Temp 97.2°F | Resp 16

## 2018-01-03 DIAGNOSIS — D469 Myelodysplastic syndrome, unspecified: Secondary | ICD-10-CM

## 2018-01-03 DIAGNOSIS — Z7189 Other specified counseling: Secondary | ICD-10-CM

## 2018-01-03 MED ORDER — ONDANSETRON HCL 4 MG PO TABS
8.0000 mg | ORAL_TABLET | Freq: Once | ORAL | Status: AC
  Administered 2018-01-03: 8 mg via ORAL
  Filled 2018-01-03: qty 2

## 2018-01-03 MED ORDER — AZACITIDINE CHEMO SQ INJECTION
75.0000 mg/m2 | Freq: Once | INTRAMUSCULAR | Status: AC
  Administered 2018-01-03: 155 mg via SUBCUTANEOUS
  Filled 2018-01-03: qty 6.2

## 2018-01-03 NOTE — Patient Instructions (Signed)
Azacitidine suspension for injection (subcutaneous use) What is this medicine? AZACITIDINE (ay za SITE i deen) is a chemotherapy drug. This medicine reduces the growth of cancer cells and can suppress the immune system. It is used for treating myelodysplastic syndrome or some types of leukemia. This medicine may be used for other purposes; ask your health care provider or pharmacist if you have questions. COMMON BRAND NAME(S): Vidaza What should I tell my health care provider before I take this medicine? They need to know if you have any of these conditions: -kidney disease -liver disease -liver tumors -an unusual or allergic reaction to azacitidine, mannitol, other medicines, foods, dyes, or preservatives -pregnant or trying to get pregnant -breast-feeding How should I use this medicine? This medicine is for injection under the skin. It is administered in a hospital or clinic by a specially trained health care professional. Talk to your pediatrician regarding the use of this medicine in children. While this drug may be prescribed for selected conditions, precautions do apply. Overdosage: If you think you have taken too much of this medicine contact a poison control center or emergency room at once. NOTE: This medicine is only for you. Do not share this medicine with others. What if I miss a dose? It is important not to miss your dose. Call your doctor or health care professional if you are unable to keep an appointment. What may interact with this medicine? Interactions have not been studied. Give your health care provider a list of all the medicines, herbs, non-prescription drugs, or dietary supplements you use. Also tell them if you smoke, drink alcohol, or use illegal drugs. Some items may interact with your medicine. This list may not describe all possible interactions. Give your health care provider a list of all the medicines, herbs, non-prescription drugs, or dietary supplements you  use. Also tell them if you smoke, drink alcohol, or use illegal drugs. Some items may interact with your medicine. What should I watch for while using this medicine? Visit your doctor for checks on your progress. This drug may make you feel generally unwell. This is not uncommon, as chemotherapy can affect healthy cells as well as cancer cells. Report any side effects. Continue your course of treatment even though you feel ill unless your doctor tells you to stop. In some cases, you may be given additional medicines to help with side effects. Follow all directions for their use. Call your doctor or health care professional for advice if you get a fever, chills or sore throat, or other symptoms of a cold or flu. Do not treat yourself. This drug decreases your body's ability to fight infections. Try to avoid being around people who are sick. This medicine may increase your risk to bruise or bleed. Call your doctor or health care professional if you notice any unusual bleeding. You may need blood work done while you are taking this medicine. Do not become pregnant while taking this medicine and for 6 months after the last dose. Women should inform their doctor if they wish to become pregnant or think they might be pregnant. Men should not father a child while taking this medicine and for 3 months after the last dose. There is a potential for serious side effects to an unborn child. Talk to your health care professional or pharmacist for more information. Do not breast-feed an infant while taking this medicine and for 1 week after the last dose. This medicine may interfere with the ability to have a child.   Talk with your doctor or health care professional if you are concerned about your fertility. What side effects may I notice from receiving this medicine? Side effects that you should report to your doctor or health care professional as soon as possible: -allergic reactions like skin rash, itching or hives,  swelling of the face, lips, or tongue -low blood counts - this medicine may decrease the number of white blood cells, red blood cells and platelets. You may be at increased risk for infections and bleeding. -signs of infection - fever or chills, cough, sore throat, pain passing urine -signs of decreased platelets or bleeding - bruising, pinpoint red spots on the skin, black, tarry stools, blood in the urine -signs of decreased red blood cells - unusually weak or tired, fainting spells, lightheadedness -signs and symptoms of kidney injury like trouble passing urine or change in the amount of urine -signs and symptoms of liver injury like dark yellow or brown urine; general ill feeling or flu-like symptoms; light-colored stools; loss of appetite; nausea; right upper belly pain; unusually weak or tired; yellowing of the eyes or skin Side effects that usually do not require medical attention (report to your doctor or health care professional if they continue or are bothersome): -constipation -diarrhea -nausea, vomiting -pain or redness at the injection site -unusually weak or tired This list may not describe all possible side effects. Call your doctor for medical advice about side effects. You may report side effects to FDA at 1-800-FDA-1088. Where should I keep my medicine? This drug is given in a hospital or clinic and will not be stored at home. NOTE: This sheet is a summary. It may not cover all possible information. If you have questions about this medicine, talk to your doctor, pharmacist, or health care provider.  2018 Elsevier/Gold Standard (2016-09-15 14:37:51)  

## 2018-01-04 ENCOUNTER — Inpatient Hospital Stay: Payer: Medicare Other

## 2018-01-04 VITALS — BP 113/58 | HR 78 | Temp 98.3°F | Resp 16

## 2018-01-04 DIAGNOSIS — Z7189 Other specified counseling: Secondary | ICD-10-CM

## 2018-01-04 DIAGNOSIS — D469 Myelodysplastic syndrome, unspecified: Secondary | ICD-10-CM

## 2018-01-04 MED ORDER — ONDANSETRON HCL 4 MG PO TABS
8.0000 mg | ORAL_TABLET | Freq: Once | ORAL | Status: AC
  Administered 2018-01-04: 8 mg via ORAL

## 2018-01-04 MED ORDER — AZACITIDINE CHEMO SQ INJECTION
75.0000 mg/m2 | Freq: Once | INTRAMUSCULAR | Status: AC
  Administered 2018-01-04: 155 mg via SUBCUTANEOUS
  Filled 2018-01-04 (×2): qty 6.2

## 2018-01-04 NOTE — Patient Instructions (Signed)
Azacitidine suspension for injection (subcutaneous use) What is this medicine? AZACITIDINE (ay za SITE i deen) is a chemotherapy drug. This medicine reduces the growth of cancer cells and can suppress the immune system. It is used for treating myelodysplastic syndrome or some types of leukemia. This medicine may be used for other purposes; ask your health care provider or pharmacist if you have questions. COMMON BRAND NAME(S): Vidaza What should I tell my health care provider before I take this medicine? They need to know if you have any of these conditions: -kidney disease -liver disease -liver tumors -an unusual or allergic reaction to azacitidine, mannitol, other medicines, foods, dyes, or preservatives -pregnant or trying to get pregnant -breast-feeding How should I use this medicine? This medicine is for injection under the skin. It is administered in a hospital or clinic by a specially trained health care professional. Talk to your pediatrician regarding the use of this medicine in children. While this drug may be prescribed for selected conditions, precautions do apply. Overdosage: If you think you have taken too much of this medicine contact a poison control center or emergency room at once. NOTE: This medicine is only for you. Do not share this medicine with others. What if I miss a dose? It is important not to miss your dose. Call your doctor or health care professional if you are unable to keep an appointment. What may interact with this medicine? Interactions have not been studied. Give your health care provider a list of all the medicines, herbs, non-prescription drugs, or dietary supplements you use. Also tell them if you smoke, drink alcohol, or use illegal drugs. Some items may interact with your medicine. This list may not describe all possible interactions. Give your health care provider a list of all the medicines, herbs, non-prescription drugs, or dietary supplements you  use. Also tell them if you smoke, drink alcohol, or use illegal drugs. Some items may interact with your medicine. What should I watch for while using this medicine? Visit your doctor for checks on your progress. This drug may make you feel generally unwell. This is not uncommon, as chemotherapy can affect healthy cells as well as cancer cells. Report any side effects. Continue your course of treatment even though you feel ill unless your doctor tells you to stop. In some cases, you may be given additional medicines to help with side effects. Follow all directions for their use. Call your doctor or health care professional for advice if you get a fever, chills or sore throat, or other symptoms of a cold or flu. Do not treat yourself. This drug decreases your body's ability to fight infections. Try to avoid being around people who are sick. This medicine may increase your risk to bruise or bleed. Call your doctor or health care professional if you notice any unusual bleeding. You may need blood work done while you are taking this medicine. Do not become pregnant while taking this medicine and for 6 months after the last dose. Women should inform their doctor if they wish to become pregnant or think they might be pregnant. Men should not father a child while taking this medicine and for 3 months after the last dose. There is a potential for serious side effects to an unborn child. Talk to your health care professional or pharmacist for more information. Do not breast-feed an infant while taking this medicine and for 1 week after the last dose. This medicine may interfere with the ability to have a child.   Talk with your doctor or health care professional if you are concerned about your fertility. What side effects may I notice from receiving this medicine? Side effects that you should report to your doctor or health care professional as soon as possible: -allergic reactions like skin rash, itching or hives,  swelling of the face, lips, or tongue -low blood counts - this medicine may decrease the number of white blood cells, red blood cells and platelets. You may be at increased risk for infections and bleeding. -signs of infection - fever or chills, cough, sore throat, pain passing urine -signs of decreased platelets or bleeding - bruising, pinpoint red spots on the skin, black, tarry stools, blood in the urine -signs of decreased red blood cells - unusually weak or tired, fainting spells, lightheadedness -signs and symptoms of kidney injury like trouble passing urine or change in the amount of urine -signs and symptoms of liver injury like dark yellow or brown urine; general ill feeling or flu-like symptoms; light-colored stools; loss of appetite; nausea; right upper belly pain; unusually weak or tired; yellowing of the eyes or skin Side effects that usually do not require medical attention (report to your doctor or health care professional if they continue or are bothersome): -constipation -diarrhea -nausea, vomiting -pain or redness at the injection site -unusually weak or tired This list may not describe all possible side effects. Call your doctor for medical advice about side effects. You may report side effects to FDA at 1-800-FDA-1088. Where should I keep my medicine? This drug is given in a hospital or clinic and will not be stored at home. NOTE: This sheet is a summary. It may not cover all possible information. If you have questions about this medicine, talk to your doctor, pharmacist, or health care provider.  2018 Elsevier/Gold Standard (2016-09-15 14:37:51)  

## 2018-01-05 ENCOUNTER — Inpatient Hospital Stay: Payer: Medicare Other

## 2018-01-05 VITALS — BP 129/61 | HR 66 | Temp 97.6°F | Resp 16

## 2018-01-05 DIAGNOSIS — D469 Myelodysplastic syndrome, unspecified: Secondary | ICD-10-CM | POA: Diagnosis not present

## 2018-01-05 DIAGNOSIS — Z7189 Other specified counseling: Secondary | ICD-10-CM

## 2018-01-05 MED ORDER — AZACITIDINE CHEMO SQ INJECTION
75.0000 mg/m2 | Freq: Once | INTRAMUSCULAR | Status: AC
Start: 2018-01-05 — End: 2018-01-05
  Administered 2018-01-05: 155 mg via SUBCUTANEOUS
  Filled 2018-01-05: qty 6.2

## 2018-01-05 MED ORDER — ONDANSETRON HCL 4 MG PO TABS
8.0000 mg | ORAL_TABLET | Freq: Once | ORAL | Status: AC
  Administered 2018-01-05: 8 mg via ORAL

## 2018-01-05 MED ORDER — ONDANSETRON HCL 4 MG PO TABS
ORAL_TABLET | ORAL | Status: AC
  Filled 2018-01-05: qty 2

## 2018-01-05 NOTE — Patient Instructions (Signed)
Azacitidine suspension for injection (subcutaneous use) What is this medicine? AZACITIDINE (ay za SITE i deen) is a chemotherapy drug. This medicine reduces the growth of cancer cells and can suppress the immune system. It is used for treating myelodysplastic syndrome or some types of leukemia. This medicine may be used for other purposes; ask your health care provider or pharmacist if you have questions. COMMON BRAND NAME(S): Vidaza What should I tell my health care provider before I take this medicine? They need to know if you have any of these conditions: -kidney disease -liver disease -liver tumors -an unusual or allergic reaction to azacitidine, mannitol, other medicines, foods, dyes, or preservatives -pregnant or trying to get pregnant -breast-feeding How should I use this medicine? This medicine is for injection under the skin. It is administered in a hospital or clinic by a specially trained health care professional. Talk to your pediatrician regarding the use of this medicine in children. While this drug may be prescribed for selected conditions, precautions do apply. Overdosage: If you think you have taken too much of this medicine contact a poison control center or emergency room at once. NOTE: This medicine is only for you. Do not share this medicine with others. What if I miss a dose? It is important not to miss your dose. Call your doctor or health care professional if you are unable to keep an appointment. What may interact with this medicine? Interactions have not been studied. Give your health care provider a list of all the medicines, herbs, non-prescription drugs, or dietary supplements you use. Also tell them if you smoke, drink alcohol, or use illegal drugs. Some items may interact with your medicine. This list may not describe all possible interactions. Give your health care provider a list of all the medicines, herbs, non-prescription drugs, or dietary supplements you  use. Also tell them if you smoke, drink alcohol, or use illegal drugs. Some items may interact with your medicine. What should I watch for while using this medicine? Visit your doctor for checks on your progress. This drug may make you feel generally unwell. This is not uncommon, as chemotherapy can affect healthy cells as well as cancer cells. Report any side effects. Continue your course of treatment even though you feel ill unless your doctor tells you to stop. In some cases, you may be given additional medicines to help with side effects. Follow all directions for their use. Call your doctor or health care professional for advice if you get a fever, chills or sore throat, or other symptoms of a cold or flu. Do not treat yourself. This drug decreases your body's ability to fight infections. Try to avoid being around people who are sick. This medicine may increase your risk to bruise or bleed. Call your doctor or health care professional if you notice any unusual bleeding. You may need blood work done while you are taking this medicine. Do not become pregnant while taking this medicine and for 6 months after the last dose. Women should inform their doctor if they wish to become pregnant or think they might be pregnant. Men should not father a child while taking this medicine and for 3 months after the last dose. There is a potential for serious side effects to an unborn child. Talk to your health care professional or pharmacist for more information. Do not breast-feed an infant while taking this medicine and for 1 week after the last dose. This medicine may interfere with the ability to have a child.   Talk with your doctor or health care professional if you are concerned about your fertility. What side effects may I notice from receiving this medicine? Side effects that you should report to your doctor or health care professional as soon as possible: -allergic reactions like skin rash, itching or hives,  swelling of the face, lips, or tongue -low blood counts - this medicine may decrease the number of white blood cells, red blood cells and platelets. You may be at increased risk for infections and bleeding. -signs of infection - fever or chills, cough, sore throat, pain passing urine -signs of decreased platelets or bleeding - bruising, pinpoint red spots on the skin, black, tarry stools, blood in the urine -signs of decreased red blood cells - unusually weak or tired, fainting spells, lightheadedness -signs and symptoms of kidney injury like trouble passing urine or change in the amount of urine -signs and symptoms of liver injury like dark yellow or brown urine; general ill feeling or flu-like symptoms; light-colored stools; loss of appetite; nausea; right upper belly pain; unusually weak or tired; yellowing of the eyes or skin Side effects that usually do not require medical attention (report to your doctor or health care professional if they continue or are bothersome): -constipation -diarrhea -nausea, vomiting -pain or redness at the injection site -unusually weak or tired This list may not describe all possible side effects. Call your doctor for medical advice about side effects. You may report side effects to FDA at 1-800-FDA-1088. Where should I keep my medicine? This drug is given in a hospital or clinic and will not be stored at home. NOTE: This sheet is a summary. It may not cover all possible information. If you have questions about this medicine, talk to your doctor, pharmacist, or health care provider.  2018 Elsevier/Gold Standard (2016-09-15 14:37:51)  

## 2018-01-06 ENCOUNTER — Inpatient Hospital Stay: Payer: Medicare Other

## 2018-01-06 VITALS — BP 107/56 | HR 73 | Temp 97.2°F | Resp 16

## 2018-01-06 DIAGNOSIS — D469 Myelodysplastic syndrome, unspecified: Secondary | ICD-10-CM

## 2018-01-06 DIAGNOSIS — Z7189 Other specified counseling: Secondary | ICD-10-CM

## 2018-01-06 MED ORDER — AZACITIDINE CHEMO SQ INJECTION
75.0000 mg/m2 | Freq: Once | INTRAMUSCULAR | Status: AC
  Administered 2018-01-06: 155 mg via SUBCUTANEOUS
  Filled 2018-01-06: qty 6.2

## 2018-01-06 MED ORDER — ONDANSETRON HCL 4 MG PO TABS
8.0000 mg | ORAL_TABLET | Freq: Once | ORAL | Status: AC
  Administered 2018-01-06: 8 mg via ORAL
  Filled 2018-01-06: qty 2

## 2018-01-06 NOTE — Patient Instructions (Signed)
Azacitidine suspension for injection (subcutaneous use) What is this medicine? AZACITIDINE (ay za SITE i deen) is a chemotherapy drug. This medicine reduces the growth of cancer cells and can suppress the immune system. It is used for treating myelodysplastic syndrome or some types of leukemia. This medicine may be used for other purposes; ask your health care provider or pharmacist if you have questions. COMMON BRAND NAME(S): Vidaza What should I tell my health care provider before I take this medicine? They need to know if you have any of these conditions: -kidney disease -liver disease -liver tumors -an unusual or allergic reaction to azacitidine, mannitol, other medicines, foods, dyes, or preservatives -pregnant or trying to get pregnant -breast-feeding How should I use this medicine? This medicine is for injection under the skin. It is administered in a hospital or clinic by a specially trained health care professional. Talk to your pediatrician regarding the use of this medicine in children. While this drug may be prescribed for selected conditions, precautions do apply. Overdosage: If you think you have taken too much of this medicine contact a poison control center or emergency room at once. NOTE: This medicine is only for you. Do not share this medicine with others. What if I miss a dose? It is important not to miss your dose. Call your doctor or health care professional if you are unable to keep an appointment. What may interact with this medicine? Interactions have not been studied. Give your health care provider a list of all the medicines, herbs, non-prescription drugs, or dietary supplements you use. Also tell them if you smoke, drink alcohol, or use illegal drugs. Some items may interact with your medicine. This list may not describe all possible interactions. Give your health care provider a list of all the medicines, herbs, non-prescription drugs, or dietary supplements you  use. Also tell them if you smoke, drink alcohol, or use illegal drugs. Some items may interact with your medicine. What should I watch for while using this medicine? Visit your doctor for checks on your progress. This drug may make you feel generally unwell. This is not uncommon, as chemotherapy can affect healthy cells as well as cancer cells. Report any side effects. Continue your course of treatment even though you feel ill unless your doctor tells you to stop. In some cases, you may be given additional medicines to help with side effects. Follow all directions for their use. Call your doctor or health care professional for advice if you get a fever, chills or sore throat, or other symptoms of a cold or flu. Do not treat yourself. This drug decreases your body's ability to fight infections. Try to avoid being around people who are sick. This medicine may increase your risk to bruise or bleed. Call your doctor or health care professional if you notice any unusual bleeding. You may need blood work done while you are taking this medicine. Do not become pregnant while taking this medicine and for 6 months after the last dose. Women should inform their doctor if they wish to become pregnant or think they might be pregnant. Men should not father a child while taking this medicine and for 3 months after the last dose. There is a potential for serious side effects to an unborn child. Talk to your health care professional or pharmacist for more information. Do not breast-feed an infant while taking this medicine and for 1 week after the last dose. This medicine may interfere with the ability to have a child.   Talk with your doctor or health care professional if you are concerned about your fertility. What side effects may I notice from receiving this medicine? Side effects that you should report to your doctor or health care professional as soon as possible: -allergic reactions like skin rash, itching or hives,  swelling of the face, lips, or tongue -low blood counts - this medicine may decrease the number of white blood cells, red blood cells and platelets. You may be at increased risk for infections and bleeding. -signs of infection - fever or chills, cough, sore throat, pain passing urine -signs of decreased platelets or bleeding - bruising, pinpoint red spots on the skin, black, tarry stools, blood in the urine -signs of decreased red blood cells - unusually weak or tired, fainting spells, lightheadedness -signs and symptoms of kidney injury like trouble passing urine or change in the amount of urine -signs and symptoms of liver injury like dark yellow or brown urine; general ill feeling or flu-like symptoms; light-colored stools; loss of appetite; nausea; right upper belly pain; unusually weak or tired; yellowing of the eyes or skin Side effects that usually do not require medical attention (report to your doctor or health care professional if they continue or are bothersome): -constipation -diarrhea -nausea, vomiting -pain or redness at the injection site -unusually weak or tired This list may not describe all possible side effects. Call your doctor for medical advice about side effects. You may report side effects to FDA at 1-800-FDA-1088. Where should I keep my medicine? This drug is given in a hospital or clinic and will not be stored at home. NOTE: This sheet is a summary. It may not cover all possible information. If you have questions about this medicine, talk to your doctor, pharmacist, or health care provider.  2018 Elsevier/Gold Standard (2016-09-15 14:37:51)  

## 2018-01-07 ENCOUNTER — Inpatient Hospital Stay: Payer: Medicare Other

## 2018-01-07 VITALS — BP 124/59 | HR 76 | Temp 97.8°F | Resp 18

## 2018-01-07 DIAGNOSIS — D469 Myelodysplastic syndrome, unspecified: Secondary | ICD-10-CM | POA: Diagnosis not present

## 2018-01-07 DIAGNOSIS — Z7189 Other specified counseling: Secondary | ICD-10-CM

## 2018-01-07 MED ORDER — DARBEPOETIN ALFA 150 MCG/0.3ML IJ SOSY
300.0000 ug | PREFILLED_SYRINGE | INTRAMUSCULAR | Status: DC
  Administered 2018-01-07: 300 ug via SUBCUTANEOUS
  Filled 2018-01-07: qty 0.6

## 2018-01-07 MED ORDER — ONDANSETRON HCL 4 MG PO TABS
8.0000 mg | ORAL_TABLET | Freq: Once | ORAL | Status: AC
  Administered 2018-01-07: 8 mg via ORAL
  Filled 2018-01-07: qty 2

## 2018-01-07 MED ORDER — AZACITIDINE CHEMO SQ INJECTION
75.0000 mg/m2 | Freq: Once | INTRAMUSCULAR | Status: AC
  Administered 2018-01-07: 155 mg via SUBCUTANEOUS
  Filled 2018-01-07: qty 6.2

## 2018-01-21 ENCOUNTER — Inpatient Hospital Stay: Payer: Medicare Other

## 2018-01-21 VITALS — BP 127/46 | HR 80 | Temp 99.0°F | Resp 18

## 2018-01-21 DIAGNOSIS — D469 Myelodysplastic syndrome, unspecified: Secondary | ICD-10-CM

## 2018-01-21 DIAGNOSIS — D630 Anemia in neoplastic disease: Secondary | ICD-10-CM

## 2018-01-21 LAB — CBC WITH DIFFERENTIAL/PLATELET
Basophils Absolute: 0 10*3/uL (ref 0–0.1)
Basophils Relative: 0 %
Eosinophils Absolute: 0 10*3/uL (ref 0–0.7)
Eosinophils Relative: 1 %
HEMATOCRIT: 21.8 % — AB (ref 40.0–52.0)
HEMOGLOBIN: 6.6 g/dL — AB (ref 13.0–18.0)
LYMPHS ABS: 1.3 10*3/uL (ref 1.0–3.6)
LYMPHS PCT: 40 %
MCH: 27.3 pg (ref 26.0–34.0)
MCHC: 30.3 g/dL — AB (ref 32.0–36.0)
MCV: 90.1 fL (ref 80.0–100.0)
MONOS PCT: 12 %
Monocytes Absolute: 0.4 10*3/uL (ref 0.2–1.0)
NEUTROS ABS: 1.6 10*3/uL (ref 1.4–6.5)
NEUTROS PCT: 47 %
Platelets: 190 10*3/uL (ref 150–440)
RBC: 2.42 MIL/uL — ABNORMAL LOW (ref 4.40–5.90)
RDW: 21.3 % — AB (ref 11.5–14.5)
WBC: 3.3 10*3/uL — ABNORMAL LOW (ref 3.8–10.6)

## 2018-01-21 MED ORDER — DARBEPOETIN ALFA 150 MCG/0.3ML IJ SOSY
300.0000 ug | PREFILLED_SYRINGE | INTRAMUSCULAR | Status: DC
  Administered 2018-01-21: 300 ug via SUBCUTANEOUS

## 2018-01-27 ENCOUNTER — Inpatient Hospital Stay (HOSPITAL_BASED_OUTPATIENT_CLINIC_OR_DEPARTMENT_OTHER): Payer: Medicare Other | Admitting: Oncology

## 2018-01-27 ENCOUNTER — Inpatient Hospital Stay: Payer: Medicare Other

## 2018-01-27 ENCOUNTER — Other Ambulatory Visit: Payer: Self-pay

## 2018-01-27 ENCOUNTER — Encounter: Payer: Self-pay | Admitting: Oncology

## 2018-01-27 VITALS — BP 121/53 | HR 70 | Temp 98.4°F | Resp 18 | Wt 180.1 lb

## 2018-01-27 DIAGNOSIS — D469 Myelodysplastic syndrome, unspecified: Secondary | ICD-10-CM | POA: Diagnosis not present

## 2018-01-27 DIAGNOSIS — Z79899 Other long term (current) drug therapy: Secondary | ICD-10-CM | POA: Diagnosis not present

## 2018-01-27 DIAGNOSIS — D630 Anemia in neoplastic disease: Secondary | ICD-10-CM

## 2018-01-27 DIAGNOSIS — Z5111 Encounter for antineoplastic chemotherapy: Secondary | ICD-10-CM

## 2018-01-27 LAB — CBC WITH DIFFERENTIAL/PLATELET
Basophils Absolute: 0 10*3/uL (ref 0–0.1)
Basophils Relative: 0 %
Eosinophils Absolute: 0 10*3/uL (ref 0–0.7)
Eosinophils Relative: 0 %
HEMATOCRIT: 23.1 % — AB (ref 40.0–52.0)
HEMOGLOBIN: 6.8 g/dL — AB (ref 13.0–18.0)
LYMPHS ABS: 1.1 10*3/uL (ref 1.0–3.6)
Lymphocytes Relative: 41 %
MCH: 26.3 pg (ref 26.0–34.0)
MCHC: 29.4 g/dL — AB (ref 32.0–36.0)
MCV: 89.5 fL (ref 80.0–100.0)
Monocytes Absolute: 0.2 10*3/uL (ref 0.2–1.0)
Monocytes Relative: 8 %
Neutro Abs: 1.3 10*3/uL — ABNORMAL LOW (ref 1.4–6.5)
Neutrophils Relative %: 51 %
Platelets: 243 10*3/uL (ref 150–440)
RBC: 2.59 MIL/uL — ABNORMAL LOW (ref 4.40–5.90)
RDW: 21 % — ABNORMAL HIGH (ref 11.5–14.5)
WBC: 2.6 10*3/uL — AB (ref 3.8–10.6)

## 2018-01-27 LAB — COMPREHENSIVE METABOLIC PANEL
ALK PHOS: 22 U/L — AB (ref 38–126)
ALT: 13 U/L — AB (ref 17–63)
AST: 22 U/L (ref 15–41)
Albumin: 4.1 g/dL (ref 3.5–5.0)
Anion gap: 10 (ref 5–15)
BILIRUBIN TOTAL: 0.9 mg/dL (ref 0.3–1.2)
BUN: 22 mg/dL — AB (ref 6–20)
CO2: 24 mmol/L (ref 22–32)
CREATININE: 0.68 mg/dL (ref 0.61–1.24)
Calcium: 8.9 mg/dL (ref 8.9–10.3)
Chloride: 103 mmol/L (ref 101–111)
GFR calc Af Amer: >60 mL/min (ref >60)
GLUCOSE: 193 mg/dL — AB (ref 65–99)
POTASSIUM: 4.4 mmol/L (ref 3.5–5.1)
Sodium: 137 mmol/L (ref 135–145)
TOTAL PROTEIN: 6.7 g/dL (ref 6.5–8.1)

## 2018-01-27 MED ORDER — LOPERAMIDE HCL 2 MG PO CAPS: 2.0000 mg | ORAL_CAPSULE | ORAL | 0 refills | Status: AC

## 2018-01-27 NOTE — Progress Notes (Signed)
Patient here today for follow up.   

## 2018-01-27 NOTE — Progress Notes (Signed)
Hematology/Oncology Follow Up Note [facility]  Telephone:([phone] [phone]  Patient Care Team: Tracie Harrier, MD as PCP - General (Internal Medicine)   Name of the patient: Paul Pham  [id number]  05-Jan-1926   REASON FOR VISIT  follow-up for management of anemia. HISTORY OF PRESENT ILLNESS Paul Pham. is a  84 y.o.  male with PMH listed below who was present as a follow-up after hospital admission for management of anemia. Patient was seen during his most recent admission on 08/18/2017. Patient is heart hearing. He has been feeling increased fatigue, dyspnea, and hemoglobin has dropped from baseline 10 to 7 within the past couple of months. Last colonoscopy was done 10 years ago. Outpatient stool occult test is negative. Patient also takes iron pills and stool is always black.  # He has had extensive workup including urinalysis negative for hematuria, normal Q46 and folic level. Normal TSH, negative hemolysis workup. Her stool occult  is being negative as well. 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 Patient and his family members understand that this condition is not curable.   Treatment:  Azacitidine 75 mg/m2  Day 1-5 of every 28 days cycle.  Cycle 1 3/7-3/8, 3/11-13 cycle 2  12/06/17- 12/10/17, day 1-5 Cycle 3 plan 5/6 -5/10.   Aranesp 176mg   INTERVAL HISTORY Paul Pham is a 84y.o. male who has above history reviewed by me today presents for follow up visit for management of MDS. He s/p 3 cycles of Azacitidine. Reports doing well at baseline. Continue to have chronic fatigue.  He is able to do some house choir, for example, [employer] worker Wife reports that patient gets tired after doing too much and wants him to slow down.    Appetite is good. Denies chest pain, SOB, abd pain. Nausea or vomiting.     Review of systems Review of Systems  Constitutional: Positive for malaise/fatigue. Negative for chills, diaphoresis, fever and weight loss.  HENT: Negative for congestion, ear discharge, ear pain, hearing loss, nosebleeds, sinus pain, sore throat and tinnitus.   Eyes: Negative for blurred vision, double vision, photophobia, pain, discharge and redness.  Respiratory: Negative for cough, hemoptysis, sputum production, shortness of breath and wheezing.        Shortness of breath with exertion  Cardiovascular: Negative for chest pain, palpitations, orthopnea, claudication and leg swelling.  Gastrointestinal: Negative for abdominal pain, blood in stool, constipation, diarrhea, heartburn, melena, nausea and vomiting.  Genitourinary: Negative for dysuria, flank pain, frequency, hematuria and urgency.  Musculoskeletal: Negative for back pain, myalgias and neck pain.  Skin: Negative for itching and rash.  Neurological: Negative for dizziness, tingling, tremors, sensory change, speech change, focal weakness, weakness and headaches.  Endo/Heme/Allergies: Negative for environmental allergies. Does not bruise/bleed easily.  Psychiatric/Behavioral: Negative for depression, hallucinations, substance abuse and suicidal ideas. The patient is not nervous/anxious.     No Known Allergies   Past Medical History:  Diagnosis Date  . Anemia   . Arthritis   . BPH (benign prostatic hypertrophy)   . Complication of anesthesia    nausea  . Diabetes type 2, controlled ([place]   . HOH (hard of hearing)    r and L ears-70% loss per pt  . Hyperlipidemia   . Hypertension  Past Surgical History:  Procedure Laterality Date  . APPENDECTOMY    . COLONOSCOPY  09/21/08   1 polyp found, tubular adenoma  . HAND SURGERY    . KNEE SURGERY Left     Social History   Socioeconomic History  . Marital status: Married    Spouse name:  Not on file  . Number of children: Not on file  . Years of education: Not on file  . Highest education level: Not on file  Occupational History  . Not on file  Social Needs  . Financial resource strain: Not on file  . Food insecurity:    Worry: Not on file    Inability: Not on file  . Transportation needs:    Medical: Not on file    Non-medical: Not on file  Tobacco Use  . Smoking status: Never Smoker  . Smokeless tobacco: Never Used  Substance and Sexual Activity  . Alcohol use: No    Alcohol/week: 0.0 oz  . Drug use: No  . Sexual activity: Not on file  Lifestyle  . Physical activity:    Days per week: Not on file    Minutes per session: Not on file  . Stress: Not on file  Relationships  . Social connections:    Talks on phone: Not on file    Gets together: Not on file    Attends religious service: Not on file    Active member of club or organization: Not on file    Attends meetings of clubs or organizations: Not on file    Relationship status: Not on file  . Intimate partner violence:    Fear of current or ex partner: Not on file    Emotionally abused: Not on file    Physically abused: Not on file    Forced sexual activity: Not on file  Other Topics Concern  . Not on file  Social History Narrative  . Not on file    Family History  Problem Relation Age of Onset  . Aneurysm Mother   . Heart disease Father   . Cancer Brother        skin  . Heart disease Brother   . Heart disease Brother   . Cancer Sister        skin  . Aneurysm Brother   . Heart disease Brother   . Heart disease Sister   . Heart disease Sister   . COPD Sister   . Diabetes Sister      Current Outpatient Medications:  .  aspirin EC 81 MG tablet, Take 81 mg by mouth daily., Disp: , Rfl:  .  Cranberry (THERACRAN PO), Take by mouth., Disp: , Rfl:  .  cyanocobalamin 1000 MCG tablet, Take 1,000 mcg by mouth daily., Disp: , Rfl:  .  finasteride (PROSCAR) 5 MG tablet, Take 5 mg by mouth  daily., Disp: , Rfl:  .  glucose blood (PRECISION QID TEST) test strip, Use 3 (three) times daily. Use as instructed., Disp: , Rfl:  .  lisinopril (PRINIVIL,ZESTRIL) 10 MG tablet, Take 10 mg by mouth daily., Disp: , Rfl:  .  loperamide (IMODIUM) 2 MG capsule, Take 1 capsule (2 mg total) by mouth See admin instructions. With onset of loose stool, take 75m followed by 263mevery 2 hours until loose bowel movement stopped. Maximum: 16 mg/day, Disp: 30 capsule, Rfl: 0 .  metformin (FORTAMET) 1000 MG (OSM) 24 hr tablet, Take 1,000 mg by mouth 2 (two) times daily with a  meal., Disp: , Rfl:  .  Multiple Vitamins-Minerals (CENTRUM SILVER PO), Take by mouth., Disp: , Rfl:  .  Omega-3 Fatty Acids (FISH OIL PO), Take by mouth., Disp: , Rfl:  .  ondansetron (ZOFRAN) 4 MG tablet, Take 1 tablet (4 mg total) by mouth every 6 (six) hours as needed for nausea or vomiting., Disp: 30 tablet, Rfl: 0 .  Polyethylene Glycol 3350 (MIRALAX PO), Take by mouth., Disp: , Rfl:  .  Saw Palmetto-Phytosterols (PROSTATE SR PO), Take by mouth., Disp: , Rfl:  .  simvastatin (ZOCOR) 40 MG tablet, Take 40 mg by mouth daily., Disp: , Rfl:   Physical exam:  Vitals:   01/27/18 1119  BP: (!) 121/53  Pulse: 70  Resp: 18  Temp: 98.4 F (36.9 C)  TempSrc: Tympanic  Weight: 180 lb 1.9 oz (81.7 kg)    Physical Exam  Constitutional: He is oriented to person, place, and time and well-developed, well-nourished, and in no distress. No distress.  HENT:  Head: Normocephalic and atraumatic.  Nose: Nose normal.  Mouth/Throat: Oropharynx is clear and moist. No oropharyngeal exudate.  Eyes: Pupils are equal, round, and reactive to light. EOM are normal. Right eye exhibits no discharge. Left eye exhibits no discharge. No scleral icterus.  Pale conjunctivae   Neck: Normal range of motion. Neck supple. No JVD present.  Cardiovascular: Normal rate, regular rhythm and normal heart sounds.  No murmur heard. Pulmonary/Chest: Effort normal and  breath sounds normal. No respiratory distress. He has no wheezes. He has no rales. He exhibits no tenderness.  Abdominal: Soft. Bowel sounds are normal. He exhibits no distension and no mass. There is no tenderness. There is no rebound.  Musculoskeletal: Normal range of motion. He exhibits no edema, tenderness or deformity.  Lymphadenopathy:    He has no cervical adenopathy.  Neurological: He is alert and oriented to person, place, and time. No cranial nerve deficit. He exhibits normal muscle tone. Coordination normal.  Skin: Skin is warm and dry. No rash noted. He is not diaphoretic. No erythema. There is pallor.  Psychiatric: Affect and judgment normal.    CMP Latest Ref Rng & Units 01/27/2018  Glucose 65 - 99 mg/dL 193(H)  BUN 6 - 20 mg/dL 22(H)  Creatinine 0.61 - 1.24 mg/dL 0.68  Sodium 135 - 145 mmol/L 137  Potassium 3.5 - 5.1 mmol/L 4.4  Chloride 101 - 111 mmol/L 103  CO2 22 - 32 mmol/L 24  Calcium 8.9 - 10.3 mg/dL 8.9  Total Protein 6.5 - 8.1 g/dL 6.7  Total Bilirubin 0.3 - 1.2 mg/dL 0.9  Alkaline Phos 38 - 126 U/L 22(L)  AST 15 - 41 U/L 22  ALT 17 - 63 U/L 13(L)   CBC Latest Ref Rng & Units 01/27/2018  WBC 3.8 - 10.6 K/uL 2.6(L)  Hemoglobin 13.0 - 18.0 g/dL 6.8(L)  Hematocrit 40.0 - 52.0 % 23.1(L)  Platelets 150 - 440 K/uL 243   Patient's workup including stool occult was negative. UA negative for hematuria. Normal E36 and folic levels. normal TSH. Negative hemolysis workup Inappropriate normal reticulocyte count.   09/22/2017  Bone marrow biopsy Diagnosis Bone Marrow, Aspirate,Biopsy, and Clot, right iliac - HYPERCELLULAR BONE MARROW FOR AGE WITH DYSPOIETIC CHANGES.- SEE COMMENT. PERIPHERAL BLOOD: - NORMOCYTIC-HYPOCHROMIC ANEMIA. - LEUKOPENIA. Diagnosis Note The bone marrow is hypercellular for age with dyspoietic changes variably involving myeloid cell lines, but with main involvement of the granulocytic/monocytic cell line. This is associated with bone marrow  monocytosis and borderline number to  slight increase in blastic cells. The latter is mainly seen by immunohistochemistry. The overall changes favor a primary myeloid neoplasm, particularly a myelodysplastic syndrome especially refractory anemia with excess blasts (RAEB-1) or possibly refractory cytopenia with multilineage dysplasia. Consideration was also given to an evolving myelodysplastic/myeloproliferative neoplasm such as chronic myelomonocytic leukemia but the lack of absolute peripheral monocytosis precludes such a diagnosis at this time. Correlation with cytogenetic studies is recommended. (BNS:ah:ecj 09/24/17) The blastic cells represent 5% of all cells Bone Marrow Flow Cytometry - NO SIGNIFICANT CD34 [place] one testing positive for  ASXL1 G295MW*41 EZH2 Splice site 324-4W>N RUNX1 A838s*56 IPSS -R 4.5, intermediate group. 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 With median survival 1.6 years.   Assessment and plan Patient is a 84 y.o. 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 1. MDS (myelodysplastic syndrome) ([place])   2. Anemia in neoplastic disease   3. Encounter for antineoplastic chemotherapy    # MDS: Tolerated 3 cycles of Azacitadine 70m/m2 Day 1-5 every 28 days. Proceed with cycle 4 next week 6/3-6/7     # Anemia, multifactorial,  secondary to MDS and chemotherapy: Hemoglobin 6.8, will add one more dose of Aranesp tomorrow.   He will repeat H&H on 6/3, if still low, plan transfusion of PRBC.   Plan Aranesp on 6/14. 6/21  Follow up with NP on 6/27 for assessment prior to  No need for transfusion at this point.  #Patient and his family members to questions and all questions answered to satisfaction.  Lab/MD in 4 weeks for assessment prior to cycle 5 Azacitadine. Patient and family members know to call if any concerns during the  interval.   ZEarlie Server MD, PhD Hematology OBridgeportat AKings [facility] Hospital CenterPager- [phone]/30/2019

## 2018-01-28 ENCOUNTER — Inpatient Hospital Stay: Payer: Medicare Other

## 2018-01-28 VITALS — BP 119/50 | HR 71 | Temp 96.6°F | Resp 18

## 2018-01-28 DIAGNOSIS — D469 Myelodysplastic syndrome, unspecified: Secondary | ICD-10-CM | POA: Diagnosis not present

## 2018-01-28 MED ORDER — DARBEPOETIN ALFA 150 MCG/0.3ML IJ SOSY
300.0000 ug | PREFILLED_SYRINGE | INTRAMUSCULAR | Status: DC
  Administered 2018-01-28: 300 ug via SUBCUTANEOUS

## 2018-01-28 NOTE — Patient Instructions (Signed)

## 2018-01-31 ENCOUNTER — Inpatient Hospital Stay: Payer: Medicare Other

## 2018-01-31 ENCOUNTER — Other Ambulatory Visit: Payer: Self-pay | Admitting: *Deleted

## 2018-01-31 ENCOUNTER — Inpatient Hospital Stay: Payer: Medicare Other | Attending: Oncology

## 2018-01-31 VITALS — BP 126/52 | HR 89 | Temp 97.7°F | Resp 18

## 2018-01-31 DIAGNOSIS — Z5111 Encounter for antineoplastic chemotherapy: Secondary | ICD-10-CM | POA: Insufficient documentation

## 2018-01-31 DIAGNOSIS — Z79899 Other long term (current) drug therapy: Secondary | ICD-10-CM | POA: Insufficient documentation

## 2018-01-31 DIAGNOSIS — D469 Myelodysplastic syndrome, unspecified: Secondary | ICD-10-CM

## 2018-01-31 DIAGNOSIS — D630 Anemia in neoplastic disease: Secondary | ICD-10-CM | POA: Insufficient documentation

## 2018-01-31 DIAGNOSIS — Z7189 Other specified counseling: Secondary | ICD-10-CM

## 2018-01-31 LAB — CBC WITH DIFFERENTIAL/PLATELET
BASOS PCT: 0 %
Basophils Absolute: 0 10*3/uL (ref 0–0.1)
EOS ABS: 0 10*3/uL (ref 0–0.7)
EOS PCT: 0 %
HCT: 24.7 % — ABNORMAL LOW (ref 40.0–52.0)
HEMOGLOBIN: 7.3 g/dL — AB (ref 13.0–18.0)
LYMPHS ABS: 1.2 10*3/uL (ref 1.0–3.6)
Lymphocytes Relative: 38 %
MCH: 26.8 pg (ref 26.0–34.0)
MCHC: 29.5 g/dL — AB (ref 32.0–36.0)
MCV: 90.6 fL (ref 80.0–100.0)
MONO ABS: 0.4 10*3/uL (ref 0.2–1.0)
MONOS PCT: 11 %
NEUTROS PCT: 51 %
Neutro Abs: 1.6 10*3/uL (ref 1.4–6.5)
Platelets: 326 10*3/uL (ref 150–440)
RBC: 2.72 MIL/uL — ABNORMAL LOW (ref 4.40–5.90)
RDW: 20.8 % — AB (ref 11.5–14.5)
WBC: 3.2 10*3/uL — ABNORMAL LOW (ref 3.8–10.6)

## 2018-01-31 LAB — TYPE AND SCREEN
ABO/RH(D): B POS
ANTIBODY SCREEN: NEGATIVE

## 2018-01-31 MED ORDER — ONDANSETRON HCL 4 MG PO TABS
8.0000 mg | ORAL_TABLET | Freq: Once | ORAL | Status: AC
  Administered 2018-01-31: 8 mg via ORAL
  Filled 2018-01-31: qty 2

## 2018-01-31 MED ORDER — AZACITIDINE CHEMO SQ INJECTION
75.0000 mg/m2 | Freq: Once | INTRAMUSCULAR | Status: AC
  Administered 2018-01-31: 155 mg via SUBCUTANEOUS
  Filled 2018-01-31 (×2): qty 6.2

## 2018-01-31 NOTE — Patient Instructions (Signed)
Azacitidine suspension for injection (subcutaneous use) What is this medicine? AZACITIDINE (ay za SITE i deen) is a chemotherapy drug. This medicine reduces the growth of cancer cells and can suppress the immune system. It is used for treating myelodysplastic syndrome or some types of leukemia. This medicine may be used for other purposes; ask your health care provider or pharmacist if you have questions. COMMON BRAND NAME(S): Vidaza What should I tell my health care provider before I take this medicine? They need to know if you have any of these conditions: -kidney disease -liver disease -liver tumors -an unusual or allergic reaction to azacitidine, mannitol, other medicines, foods, dyes, or preservatives -pregnant or trying to get pregnant -breast-feeding How should I use this medicine? This medicine is for injection under the skin. It is administered in a hospital or clinic by a specially trained health care professional. Talk to your pediatrician regarding the use of this medicine in children. While this drug may be prescribed for selected conditions, precautions do apply. Overdosage: If you think you have taken too much of this medicine contact a poison control center or emergency room at once. NOTE: This medicine is only for you. Do not share this medicine with others. What if I miss a dose? It is important not to miss your dose. Call your doctor or health care professional if you are unable to keep an appointment. What may interact with this medicine? Interactions have not been studied. Give your health care provider a list of all the medicines, herbs, non-prescription drugs, or dietary supplements you use. Also tell them if you smoke, drink alcohol, or use illegal drugs. Some items may interact with your medicine. This list may not describe all possible interactions. Give your health care provider a list of all the medicines, herbs, non-prescription drugs, or dietary supplements you  use. Also tell them if you smoke, drink alcohol, or use illegal drugs. Some items may interact with your medicine. What should I watch for while using this medicine? Visit your doctor for checks on your progress. This drug may make you feel generally unwell. This is not uncommon, as chemotherapy can affect healthy cells as well as cancer cells. Report any side effects. Continue your course of treatment even though you feel ill unless your doctor tells you to stop. In some cases, you may be given additional medicines to help with side effects. Follow all directions for their use. Call your doctor or health care professional for advice if you get a fever, chills or sore throat, or other symptoms of a cold or flu. Do not treat yourself. This drug decreases your body's ability to fight infections. Try to avoid being around people who are sick. This medicine may increase your risk to bruise or bleed. Call your doctor or health care professional if you notice any unusual bleeding. You may need blood work done while you are taking this medicine. Do not become pregnant while taking this medicine and for 6 months after the last dose. Women should inform their doctor if they wish to become pregnant or think they might be pregnant. Men should not father a child while taking this medicine and for 3 months after the last dose. There is a potential for serious side effects to an unborn child. Talk to your health care professional or pharmacist for more information. Do not breast-feed an infant while taking this medicine and for 1 week after the last dose. This medicine may interfere with the ability to have a child.   Talk with your doctor or health care professional if you are concerned about your fertility. What side effects may I notice from receiving this medicine? Side effects that you should report to your doctor or health care professional as soon as possible: -allergic reactions like skin rash, itching or hives,  swelling of the face, lips, or tongue -low blood counts - this medicine may decrease the number of white blood cells, red blood cells and platelets. You may be at increased risk for infections and bleeding. -signs of infection - fever or chills, cough, sore throat, pain passing urine -signs of decreased platelets or bleeding - bruising, pinpoint red spots on the skin, black, tarry stools, blood in the urine -signs of decreased red blood cells - unusually weak or tired, fainting spells, lightheadedness -signs and symptoms of kidney injury like trouble passing urine or change in the amount of urine -signs and symptoms of liver injury like dark yellow or brown urine; general ill feeling or flu-like symptoms; light-colored stools; loss of appetite; nausea; right upper belly pain; unusually weak or tired; yellowing of the eyes or skin Side effects that usually do not require medical attention (report to your doctor or health care professional if they continue or are bothersome): -constipation -diarrhea -nausea, vomiting -pain or redness at the injection site -unusually weak or tired This list may not describe all possible side effects. Call your doctor for medical advice about side effects. You may report side effects to FDA at 1-800-FDA-1088. Where should I keep my medicine? This drug is given in a hospital or clinic and will not be stored at home. NOTE: This sheet is a summary. It may not cover all possible information. If you have questions about this medicine, talk to your doctor, pharmacist, or health care provider.  2018 Elsevier/Gold Standard (2016-09-15 14:37:51)  

## 2018-02-01 ENCOUNTER — Inpatient Hospital Stay: Payer: Medicare Other | Attending: Oncology

## 2018-02-01 VITALS — BP 116/62 | HR 66 | Temp 97.2°F | Resp 18

## 2018-02-01 DIAGNOSIS — D469 Myelodysplastic syndrome, unspecified: Secondary | ICD-10-CM

## 2018-02-01 DIAGNOSIS — Z79899 Other long term (current) drug therapy: Secondary | ICD-10-CM | POA: Diagnosis not present

## 2018-02-01 DIAGNOSIS — D630 Anemia in neoplastic disease: Secondary | ICD-10-CM | POA: Diagnosis not present

## 2018-02-01 DIAGNOSIS — Z7189 Other specified counseling: Secondary | ICD-10-CM

## 2018-02-01 MED ORDER — ONDANSETRON HCL 4 MG PO TABS
ORAL_TABLET | ORAL | Status: AC
  Filled 2018-02-01: qty 2

## 2018-02-01 MED ORDER — ONDANSETRON HCL 4 MG PO TABS
8.0000 mg | ORAL_TABLET | Freq: Once | ORAL | Status: AC
  Administered 2018-02-01: 8 mg via ORAL

## 2018-02-01 MED ORDER — AZACITIDINE CHEMO SQ INJECTION
75.0000 mg/m2 | Freq: Once | INTRAMUSCULAR | Status: AC
  Administered 2018-02-01: 155 mg via SUBCUTANEOUS
  Filled 2018-02-01: qty 6.2

## 2018-02-01 NOTE — Addendum Note (Signed)
Addended by: Earlie Server on: 02/01/2018 09:19 AM   Modules accepted: Orders

## 2018-02-02 ENCOUNTER — Inpatient Hospital Stay: Payer: Medicare Other

## 2018-02-02 VITALS — BP 101/54 | HR 68 | Temp 97.4°F | Resp 18

## 2018-02-02 DIAGNOSIS — D469 Myelodysplastic syndrome, unspecified: Secondary | ICD-10-CM

## 2018-02-02 DIAGNOSIS — Z7189 Other specified counseling: Secondary | ICD-10-CM

## 2018-02-02 MED ORDER — AZACITIDINE CHEMO SQ INJECTION
75.0000 mg/m2 | Freq: Once | INTRAMUSCULAR | Status: AC
  Administered 2018-02-02: 155 mg via SUBCUTANEOUS
  Filled 2018-02-02: qty 6.2

## 2018-02-02 MED ORDER — ONDANSETRON HCL 4 MG PO TABS
8.0000 mg | ORAL_TABLET | Freq: Once | ORAL | Status: AC
  Administered 2018-02-02: 8 mg via ORAL

## 2018-02-02 MED ORDER — ONDANSETRON HCL 4 MG PO TABS
ORAL_TABLET | ORAL | Status: AC
  Filled 2018-02-02: qty 2

## 2018-02-03 ENCOUNTER — Inpatient Hospital Stay: Payer: Medicare Other

## 2018-02-03 VITALS — BP 105/59 | HR 75 | Temp 97.9°F | Resp 18

## 2018-02-03 DIAGNOSIS — D469 Myelodysplastic syndrome, unspecified: Secondary | ICD-10-CM

## 2018-02-03 DIAGNOSIS — Z7189 Other specified counseling: Secondary | ICD-10-CM

## 2018-02-03 MED ORDER — AZACITIDINE CHEMO SQ INJECTION
75.0000 mg/m2 | Freq: Once | INTRAMUSCULAR | Status: AC
  Administered 2018-02-03: 155 mg via SUBCUTANEOUS
  Filled 2018-02-03 (×2): qty 6.2

## 2018-02-03 MED ORDER — ONDANSETRON HCL 4 MG PO TABS
8.0000 mg | ORAL_TABLET | Freq: Once | ORAL | Status: AC
  Administered 2018-02-03: 8 mg via ORAL
  Filled 2018-02-03: qty 2

## 2018-02-03 NOTE — Patient Instructions (Signed)
Azacitidine suspension for injection (subcutaneous use) What is this medicine? AZACITIDINE (ay za SITE i deen) is a chemotherapy drug. This medicine reduces the growth of cancer cells and can suppress the immune system. It is used for treating myelodysplastic syndrome or some types of leukemia. This medicine may be used for other purposes; ask your health care provider or pharmacist if you have questions. COMMON BRAND NAME(S): Vidaza What should I tell my health care provider before I take this medicine? They need to know if you have any of these conditions: -kidney disease -liver disease -liver tumors -an unusual or allergic reaction to azacitidine, mannitol, other medicines, foods, dyes, or preservatives -pregnant or trying to get pregnant -breast-feeding How should I use this medicine? This medicine is for injection under the skin. It is administered in a hospital or clinic by a specially trained health care professional. Talk to your pediatrician regarding the use of this medicine in children. While this drug may be prescribed for selected conditions, precautions do apply. Overdosage: If you think you have taken too much of this medicine contact a poison control center or emergency room at once. NOTE: This medicine is only for you. Do not share this medicine with others. What if I miss a dose? It is important not to miss your dose. Call your doctor or health care professional if you are unable to keep an appointment. What may interact with this medicine? Interactions have not been studied. Give your health care provider a list of all the medicines, herbs, non-prescription drugs, or dietary supplements you use. Also tell them if you smoke, drink alcohol, or use illegal drugs. Some items may interact with your medicine. This list may not describe all possible interactions. Give your health care provider a list of all the medicines, herbs, non-prescription drugs, or dietary supplements you  use. Also tell them if you smoke, drink alcohol, or use illegal drugs. Some items may interact with your medicine. What should I watch for while using this medicine? Visit your doctor for checks on your progress. This drug may make you feel generally unwell. This is not uncommon, as chemotherapy can affect healthy cells as well as cancer cells. Report any side effects. Continue your course of treatment even though you feel ill unless your doctor tells you to stop. In some cases, you may be given additional medicines to help with side effects. Follow all directions for their use. Call your doctor or health care professional for advice if you get a fever, chills or sore throat, or other symptoms of a cold or flu. Do not treat yourself. This drug decreases your body's ability to fight infections. Try to avoid being around people who are sick. This medicine may increase your risk to bruise or bleed. Call your doctor or health care professional if you notice any unusual bleeding. You may need blood work done while you are taking this medicine. Do not become pregnant while taking this medicine and for 6 months after the last dose. Women should inform their doctor if they wish to become pregnant or think they might be pregnant. Men should not father a child while taking this medicine and for 3 months after the last dose. There is a potential for serious side effects to an unborn child. Talk to your health care professional or pharmacist for more information. Do not breast-feed an infant while taking this medicine and for 1 week after the last dose. This medicine may interfere with the ability to have a child.   Talk with your doctor or health care professional if you are concerned about your fertility. What side effects may I notice from receiving this medicine? Side effects that you should report to your doctor or health care professional as soon as possible: -allergic reactions like skin rash, itching or hives,  swelling of the face, lips, or tongue -low blood counts - this medicine may decrease the number of white blood cells, red blood cells and platelets. You may be at increased risk for infections and bleeding. -signs of infection - fever or chills, cough, sore throat, pain passing urine -signs of decreased platelets or bleeding - bruising, pinpoint red spots on the skin, black, tarry stools, blood in the urine -signs of decreased red blood cells - unusually weak or tired, fainting spells, lightheadedness -signs and symptoms of kidney injury like trouble passing urine or change in the amount of urine -signs and symptoms of liver injury like dark yellow or brown urine; general ill feeling or flu-like symptoms; light-colored stools; loss of appetite; nausea; right upper belly pain; unusually weak or tired; yellowing of the eyes or skin Side effects that usually do not require medical attention (report to your doctor or health care professional if they continue or are bothersome): -constipation -diarrhea -nausea, vomiting -pain or redness at the injection site -unusually weak or tired This list may not describe all possible side effects. Call your doctor for medical advice about side effects. You may report side effects to FDA at 1-800-FDA-1088. Where should I keep my medicine? This drug is given in a hospital or clinic and will not be stored at home. NOTE: This sheet is a summary. It may not cover all possible information. If you have questions about this medicine, talk to your doctor, pharmacist, or health care provider.  2018 Elsevier/Gold Standard (2016-09-15 14:37:51)  

## 2018-02-04 ENCOUNTER — Inpatient Hospital Stay: Payer: Medicare Other

## 2018-02-04 VITALS — BP 115/61 | HR 71 | Temp 98.0°F | Resp 18

## 2018-02-04 DIAGNOSIS — D469 Myelodysplastic syndrome, unspecified: Secondary | ICD-10-CM

## 2018-02-04 DIAGNOSIS — Z7189 Other specified counseling: Secondary | ICD-10-CM

## 2018-02-04 MED ORDER — ONDANSETRON HCL 4 MG PO TABS
8.0000 mg | ORAL_TABLET | Freq: Once | ORAL | Status: AC
  Administered 2018-02-04: 8 mg via ORAL
  Filled 2018-02-04: qty 2

## 2018-02-04 MED ORDER — AZACITIDINE CHEMO SQ INJECTION
75.0000 mg/m2 | Freq: Once | INTRAMUSCULAR | Status: AC
  Administered 2018-02-04: 155 mg via SUBCUTANEOUS
  Filled 2018-02-04: qty 6.2

## 2018-02-11 ENCOUNTER — Inpatient Hospital Stay: Payer: Medicare Other

## 2018-02-11 ENCOUNTER — Ambulatory Visit: Payer: TRICARE For Life (TFL)

## 2018-02-11 VITALS — BP 128/61 | HR 78 | Temp 96.9°F | Resp 18

## 2018-02-11 DIAGNOSIS — D469 Myelodysplastic syndrome, unspecified: Secondary | ICD-10-CM | POA: Diagnosis not present

## 2018-02-11 DIAGNOSIS — Z5111 Encounter for antineoplastic chemotherapy: Secondary | ICD-10-CM | POA: Diagnosis not present

## 2018-02-11 DIAGNOSIS — D630 Anemia in neoplastic disease: Secondary | ICD-10-CM

## 2018-02-11 LAB — CBC WITH DIFFERENTIAL/PLATELET
BASOS PCT: 0 %
Basophils Absolute: 0 10*3/uL (ref 0–0.1)
EOS PCT: 1 %
Eosinophils Absolute: 0 10*3/uL (ref 0–0.7)
HEMATOCRIT: 22.8 % — AB (ref 40.0–52.0)
Hemoglobin: 6.8 g/dL — ABNORMAL LOW (ref 13.0–18.0)
LYMPHS PCT: 32 %
Lymphs Abs: 1.3 10*3/uL (ref 1.0–3.6)
MCH: 26.3 pg (ref 26.0–34.0)
MCHC: 29.7 g/dL — AB (ref 32.0–36.0)
MCV: 88.7 fL (ref 80.0–100.0)
MONO ABS: 0.5 10*3/uL (ref 0.2–1.0)
MONOS PCT: 11 %
Neutro Abs: 2.3 10*3/uL (ref 1.4–6.5)
Neutrophils Relative %: 56 %
Platelets: 341 10*3/uL (ref 150–440)
RBC: 2.58 MIL/uL — ABNORMAL LOW (ref 4.40–5.90)
RDW: 20.3 % — AB (ref 11.5–14.5)
WBC: 4.1 10*3/uL (ref 3.8–10.6)

## 2018-02-11 LAB — SAMPLE TO BLOOD BANK

## 2018-02-11 MED ORDER — DARBEPOETIN ALFA 150 MCG/0.3ML IJ SOSY
300.0000 ug | PREFILLED_SYRINGE | INTRAMUSCULAR | Status: DC
  Administered 2018-02-11: 300 ug via SUBCUTANEOUS

## 2018-02-11 NOTE — Patient Instructions (Signed)

## 2018-02-18 ENCOUNTER — Inpatient Hospital Stay: Payer: Medicare Other | Attending: Oncology

## 2018-02-18 ENCOUNTER — Other Ambulatory Visit: Payer: Self-pay | Admitting: *Deleted

## 2018-02-18 ENCOUNTER — Inpatient Hospital Stay: Payer: Medicare Other

## 2018-02-18 ENCOUNTER — Other Ambulatory Visit: Payer: Self-pay | Admitting: Oncology

## 2018-02-18 VITALS — BP 110/58 | HR 80 | Temp 98.3°F | Resp 18

## 2018-02-18 DIAGNOSIS — D630 Anemia in neoplastic disease: Secondary | ICD-10-CM

## 2018-02-18 DIAGNOSIS — D469 Myelodysplastic syndrome, unspecified: Secondary | ICD-10-CM

## 2018-02-18 DIAGNOSIS — Z5111 Encounter for antineoplastic chemotherapy: Secondary | ICD-10-CM | POA: Diagnosis not present

## 2018-02-18 DIAGNOSIS — Z79899 Other long term (current) drug therapy: Secondary | ICD-10-CM | POA: Insufficient documentation

## 2018-02-18 LAB — COMPREHENSIVE METABOLIC PANEL
ALK PHOS: 24 U/L — AB (ref 38–126)
ALT: 14 U/L — AB (ref 17–63)
AST: 19 U/L (ref 15–41)
Albumin: 4.1 g/dL (ref 3.5–5.0)
Anion gap: 9 (ref 5–15)
BUN: 15 mg/dL (ref 6–20)
CALCIUM: 8.5 mg/dL — AB (ref 8.9–10.3)
CHLORIDE: 101 mmol/L (ref 101–111)
CO2: 25 mmol/L (ref 22–32)
CREATININE: 0.67 mg/dL (ref 0.61–1.24)
GFR calc Af Amer: >60 mL/min (ref >60)
Glucose, Bld: 189 mg/dL — ABNORMAL HIGH (ref 65–99)
Potassium: 4.3 mmol/L (ref 3.5–5.1)
Sodium: 135 mmol/L (ref 135–145)
Total Bilirubin: 1.1 mg/dL (ref 0.3–1.2)
Total Protein: 6.8 g/dL (ref 6.5–8.1)

## 2018-02-18 LAB — CBC WITH DIFFERENTIAL/PLATELET
Basophils Absolute: 0 10*3/uL (ref 0–0.1)
Basophils Relative: 0 %
EOS ABS: 0 10*3/uL (ref 0–0.7)
EOS PCT: 0 %
HCT: 23.3 % — ABNORMAL LOW (ref 40.0–52.0)
Hemoglobin: 6.8 g/dL — ABNORMAL LOW (ref 13.0–18.0)
Lymphocytes Relative: 43 %
Lymphs Abs: 1.5 10*3/uL (ref 1.0–3.6)
MCH: 26.4 pg (ref 26.0–34.0)
MCHC: 29.3 g/dL — AB (ref 32.0–36.0)
MCV: 90.1 fL (ref 80.0–100.0)
MONOS PCT: 11 %
Monocytes Absolute: 0.4 10*3/uL (ref 0.2–1.0)
Neutro Abs: 1.6 10*3/uL (ref 1.4–6.5)
Neutrophils Relative %: 46 %
PLATELETS: 176 10*3/uL (ref 150–440)
RBC: 2.58 MIL/uL — ABNORMAL LOW (ref 4.40–5.90)
RDW: 22.2 % — AB (ref 11.5–14.5)
WBC: 3.5 10*3/uL — AB (ref 3.8–10.6)

## 2018-02-18 LAB — PREPARE RBC (CROSSMATCH)

## 2018-02-18 MED ORDER — DARBEPOETIN ALFA 150 MCG/0.3ML IJ SOSY
300.0000 ug | PREFILLED_SYRINGE | INTRAMUSCULAR | Status: DC
  Administered 2018-02-18: 300 ug via SUBCUTANEOUS

## 2018-02-21 ENCOUNTER — Inpatient Hospital Stay: Payer: Medicare Other

## 2018-02-21 DIAGNOSIS — D630 Anemia in neoplastic disease: Secondary | ICD-10-CM

## 2018-02-21 DIAGNOSIS — Z5111 Encounter for antineoplastic chemotherapy: Secondary | ICD-10-CM | POA: Diagnosis not present

## 2018-02-21 DIAGNOSIS — D469 Myelodysplastic syndrome, unspecified: Secondary | ICD-10-CM

## 2018-02-21 MED ORDER — ACETAMINOPHEN 325 MG PO TABS
650.0000 mg | ORAL_TABLET | Freq: Once | ORAL | Status: AC
  Administered 2018-02-21: 650 mg via ORAL
  Filled 2018-02-21: qty 2

## 2018-02-21 MED ORDER — DIPHENHYDRAMINE HCL 25 MG PO CAPS
25.0000 mg | ORAL_CAPSULE | Freq: Once | ORAL | Status: AC
  Administered 2018-02-21: 25 mg via ORAL
  Filled 2018-02-21: qty 1

## 2018-02-21 MED ORDER — SODIUM CHLORIDE 0.9 % IV SOLN
250.0000 mL | Freq: Once | INTRAVENOUS | Status: AC
  Administered 2018-02-21: 250 mL via INTRAVENOUS
  Filled 2018-02-21: qty 250

## 2018-02-21 NOTE — Progress Notes (Signed)
UNMATCHED BLOOD PRODUCT NOTE  Compare the patient ID on the blood tag to the patient ID on the hospital armband and Blood Bank armband. Then confirm the unit number on the blood tag matches the unit number on the blood product.  If a discrepancy is discovered return the product to blood bank immediately.   Blood Product Type: Red Blood Cells  Unit #: ([id number])   Product Code #: (N2258T46)    Start Time: 10:55  Starting Rate: 141ml/hr  Rate increase/decreased  (if applicable):     219 ml/hr  Rate changed time (if applicable): 4712   Stop Time: 5271   All Other Documentation should be documented within the Blood Admin Flowsheet per policy.

## 2018-02-21 NOTE — Patient Instructions (Signed)

## 2018-02-22 LAB — BPAM RBC
BLOOD PRODUCT EXPIRATION DATE: 201906302359
Blood Product Expiration Date: 201907072359
ISSUE DATE / TIME: 201906241044
UNIT TYPE AND RH: 5100
Unit Type and Rh: 1700

## 2018-02-22 LAB — TYPE AND SCREEN
ABO/RH(D): B POS
Antibody Screen: NEGATIVE
Unit division: 0
Unit division: 0

## 2018-02-24 ENCOUNTER — Other Ambulatory Visit: Payer: Self-pay

## 2018-02-24 ENCOUNTER — Inpatient Hospital Stay: Payer: Medicare Other

## 2018-02-24 ENCOUNTER — Encounter: Payer: Self-pay | Admitting: Oncology

## 2018-02-24 ENCOUNTER — Inpatient Hospital Stay (HOSPITAL_BASED_OUTPATIENT_CLINIC_OR_DEPARTMENT_OTHER): Payer: Medicare Other | Admitting: Oncology

## 2018-02-24 VITALS — BP 131/63 | HR 75 | Temp 96.8°F | Resp 18 | Wt 179.0 lb

## 2018-02-24 DIAGNOSIS — D469 Myelodysplastic syndrome, unspecified: Secondary | ICD-10-CM

## 2018-02-24 DIAGNOSIS — D630 Anemia in neoplastic disease: Secondary | ICD-10-CM

## 2018-02-24 DIAGNOSIS — Z79899 Other long term (current) drug therapy: Secondary | ICD-10-CM | POA: Diagnosis not present

## 2018-02-24 DIAGNOSIS — Z5111 Encounter for antineoplastic chemotherapy: Secondary | ICD-10-CM | POA: Diagnosis not present

## 2018-02-24 LAB — CBC WITH DIFFERENTIAL/PLATELET
BASOS PCT: 0 %
Basophils Absolute: 0 10*3/uL (ref 0–0.1)
Eosinophils Absolute: 0 10*3/uL (ref 0–0.7)
Eosinophils Relative: 0 %
HEMATOCRIT: 25.7 % — AB (ref 40.0–52.0)
Hemoglobin: 7.6 g/dL — ABNORMAL LOW (ref 13.0–18.0)
LYMPHS ABS: 1.4 10*3/uL (ref 1.0–3.6)
Lymphocytes Relative: 40 %
MCH: 25.9 pg — ABNORMAL LOW (ref 26.0–34.0)
MCHC: 29.5 g/dL — AB (ref 32.0–36.0)
MCV: 88 fL (ref 80.0–100.0)
MONO ABS: 0.2 10*3/uL (ref 0.2–1.0)
MONOS PCT: 7 %
NEUTROS ABS: 1.8 10*3/uL (ref 1.4–6.5)
Neutrophils Relative %: 53 %
Platelets: 245 10*3/uL (ref 150–440)
RBC: 2.92 MIL/uL — ABNORMAL LOW (ref 4.40–5.90)
RDW: 22.3 % — AB (ref 11.5–14.5)
WBC: 3.5 10*3/uL — ABNORMAL LOW (ref 3.8–10.6)

## 2018-02-24 LAB — COMPREHENSIVE METABOLIC PANEL
ALBUMIN: 4.3 g/dL (ref 3.5–5.0)
ALK PHOS: 26 U/L — AB (ref 38–126)
ALT: 14 U/L (ref 0–44)
AST: 21 U/L (ref 15–41)
Anion gap: 12 (ref 5–15)
BUN: 20 mg/dL (ref 8–23)
CALCIUM: 8.9 mg/dL (ref 8.9–10.3)
CHLORIDE: 101 mmol/L (ref 98–111)
CO2: 23 mmol/L (ref 22–32)
CREATININE: 0.67 mg/dL (ref 0.61–1.24)
GFR calc Af Amer: >60 mL/min (ref >60)
GFR calc non Af Amer: >60 mL/min (ref >60)
GLUCOSE: 194 mg/dL — AB (ref 70–99)
Potassium: 4.6 mmol/L (ref 3.5–5.1)
Sodium: 136 mmol/L (ref 135–145)
Total Bilirubin: 0.8 mg/dL (ref 0.3–1.2)
Total Protein: 6.9 g/dL (ref 6.5–8.1)

## 2018-02-24 LAB — SAMPLE TO BLOOD BANK

## 2018-02-24 NOTE — Progress Notes (Signed)
Hematology/Oncology Follow Up Note [facility]  Telephone:([phone] [phone]  Patient Care Team: Tracie Harrier, MD as PCP - General (Internal Medicine)   Name of the patient: Paul Pham  [id number]  July 05, 1926   REASON FOR VISIT  follow-up for management of MDS HISTORY OF PRESENT ILLNESS  Paul Pham. is a  84 y.o.  male with PMH listed below who was present as a follow-up of MDS. He is s/p 4 cycles of Vidaza (D1-5). Last given 6/3-6/7.  Oncology History   Patient was seen during his most recent admission on 08/18/2017. Patient is hard of hearing. He has been feeling increased fatigue, dyspnea, and hemoglobin has dropped from baseline 10 to 7 within the past couple of months. Last colonoscopy was done 10 years ago. Outpatient stool occult test is negative. Patient also takes iron pills and stool is always black.  # He has had extensive workup including urinalysis negative for hematuria, normal K24 and folic level. Normal TSH, negative hemolysis workup. Her stool occult  is being negative as well. 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 Patient and his family members understand that this condition is not curable.   Treatment:  Azacitidine 75 mg/m2  Day 1-5 of every 28 days cycle.  Cycle 1 3/7-3/8, 3/11-13 cycle 2  12/06/17- 12/10/17, day 1-5 Cycle 3 plan 5/6 -5/10. Cycle 4 6/3-6/7 Cycle 5. Plan for 7/8-7/12 d/t holiday   Aranesp 184mg 3/14 and 3/28 3052m 5/10, 5/24, 5/31, 6/14 and 6/21       MDS (myelodysplastic syndrome) ([facility]  10/05/2017 Initial Diagnosis    MDS (myel[place]ysplastic syndrome) ([facility]     INTERVAL HISTORY  Patient presents today for lab check prior to Cycle 5 Vidaza.  Required 1 unit packed red blood cells on  Monday, 02/21/2018 for symptomatic anemia  with a hemoglobin of 6.8.   He reports that he continues to be doing fairly well and is at baseline.  Continues to have chronic fatigue.  Continues doing house chores such as mowing the grass.  Is trying to make a conscience effort to "slow down".  He denies any chest pain, shortness of breath, abdominal pain, nausea or vomiting.  His appetite remains good.    Review of systems Review of Systems  Constitutional: Positive for malaise/fatigue and weight loss. Negative for chills and fever.  HENT: Negative for congestion and ear pain.   Eyes: Negative.  Negative for blurred vision and double vision.  Respiratory: Negative.  Negative for cough, sputum production and shortness of breath.   Cardiovascular: Negative.  Negative for chest pain, palpitations and leg swelling.  Gastrointestinal: Negative.  Negative for abdominal pain, constipation, diarrhea, nausea and vomiting.  Genitourinary: Negative for dysuria, frequency and urgency.  Musculoskeletal: Negative for back pain and falls.  Skin: Negative.  Negative for rash.  Neurological: Negative.  Negative for weakness and headaches.  Endo/Heme/Allergies: Negative.  Does not bruise/bleed easily.  Psychiatric/Behavioral: Negative.  Negative for depression. The patient is not nervous/anxious and does not have insomnia.     No Known Allergies   Past Medical History:  Diagnosis Date  . Anemia   . Arthritis   . BPH (benign prostatic hypertrophy)   . Complication of anesthesia    nausea  . Diabetes type 2, controlled ([facility]  .  HOH (hard of hearing)    r and L ears-70% loss per pt  . Hyperlipidemia   . Hypertension      Past Surgical History:  Procedure Laterality Date  . APPENDECTOMY    . COLONOSCOPY  09/21/08   1 polyp found, tubular adenoma  . HAND SURGERY    . KNEE SURGERY Left     Social History   Socioeconomic History  . Marital status: Married    Spouse name: Not on file  .  Number of children: Not on file  . Years of education: Not on file  . Highest education level: Not on file  Occupational History  . Not on file  Social Needs  . Financial resource strain: Not on file  . Food insecurity:    Worry: Not on file    Inability: Not on file  . Transportation needs:    Medical: Not on file    Non-medical: Not on file  Tobacco Use  . Smoking status: Never Smoker  . Smokeless tobacco: Never Used  Substance and Sexual Activity  . Alcohol use: No    Alcohol/week: 0.0 oz  . Drug use: No  . Sexual activity: Not on file  Lifestyle  . Physical activity:    Days per week: Not on file    Minutes per session: Not on file  . Stress: Not on file  Relationships  . Social connections:    Talks on phone: Not on file    Gets together: Not on file    Attends religious service: Not on file    Active member of club or organization: Not on file    Attends meetings of clubs or organizations: Not on file    Relationship status: Not on file  . Intimate partner violence:    Fear of current or ex partner: Not on file    Emotionally abused: Not on file    Physically abused: Not on file    Forced sexual activity: Not on file  Other Topics Concern  . Not on file  Social History Narrative  . Not on file    Family History  Problem Relation Age of Onset  . Aneurysm Mother   . Heart disease Father   . Cancer Brother        skin  . Heart disease Brother   . Heart disease Brother   . Cancer Sister        skin  . Aneurysm Brother   . Heart disease Brother   . Heart disease Sister   . Heart disease Sister   . COPD Sister   . Diabetes Sister      Current Outpatient Medications:  .  Cranberry (THERACRAN PO), Take by mouth., Disp: , Rfl:  .  cyanocobalamin 1000 MCG tablet, Take 1,000 mcg by mouth daily., Disp: , Rfl:  .  finasteride (PROSCAR) 5 MG tablet, Take 5 mg by mouth daily., Disp: , Rfl:  .  glucose blood (PRECISION QID TEST) test strip, Use 3 (three)  times daily. Use as instructed., Disp: , Rfl:  .  lisinopril (PRINIVIL,ZESTRIL) 10 MG tablet, Take 10 mg by mouth daily., Disp: , Rfl:  .  loperamide (IMODIUM) 2 MG capsule, Take 1 capsule (2 mg total) by mouth See admin instructions. With onset of loose stool, take '4mg'$  followed by '2mg'$  every 2 hours until loose bowel movement stopped. Maximum: 16 mg/day, Disp: 30 capsule, Rfl: 0 .  metformin (FORTAMET) 1000 MG (OSM) 24 hr tablet, Take 1,000  mg by mouth 2 (two) times daily with a meal., Disp: , Rfl:  .  Multiple Vitamins-Minerals (CENTRUM SILVER PO), Take by mouth., Disp: , Rfl:  .  Omega-3 Fatty Acids (FISH OIL PO), Take by mouth., Disp: , Rfl:  .  ondansetron (ZOFRAN) 4 MG tablet, Take 1 tablet (4 mg total) by mouth every 6 (six) hours as needed for nausea or vomiting., Disp: 30 tablet, Rfl: 0 .  Polyethylene Glycol 3350 (MIRALAX PO), Take by mouth., Disp: , Rfl:  .  Saw Palmetto-Phytosterols (PROSTATE SR PO), Take by mouth., Disp: , Rfl:  .  simvastatin (ZOCOR) 40 MG tablet, Take 40 mg by mouth daily., Disp: , Rfl:  .  aspirin EC 81 MG tablet, Take 81 mg by mouth daily., Disp: , Rfl:   Physical exam:  Vitals:   02/24/18 0849  BP: 131/63  Pulse: 75  Resp: 18  Temp: (!) 96.8 F (36 C)  TempSrc: Tympanic  SpO2: 99%  Weight: 179 lb 0.2 oz (81.2 kg)    Physical Exam  Constitutional: He is oriented to person, place, and time and well-developed, well-nourished, and in no distress. Vital signs are normal.  HENT:  Head: Normocephalic and atraumatic.  Eyes: Pupils are equal, round, and reactive to light.  Neck: Normal range of motion.  Cardiovascular: Normal rate, regular rhythm and normal heart sounds.  No murmur heard. Pulmonary/Chest: Effort normal and breath sounds normal. He has no wheezes.  Abdominal: Soft. Normal appearance and bowel sounds are normal. He exhibits no distension. There is no tenderness.  Musculoskeletal: Normal range of motion. He exhibits no edema.  Neurological: He  is alert and oriented to person, place, and time. Gait normal.  Skin: Skin is warm and dry. No rash noted.  Psychiatric: Mood, memory, affect and judgment normal.    CMP Latest Ref Rng & Units 02/24/2018  Glucose 70 - 99 mg/dL 194(H)  BUN 8 - 23 mg/dL 20  Creatinine 0.61 - 1.24 mg/dL 0.67  Sodium 135 - 145 mmol/L 136  Potassium 3.5 - 5.1 mmol/L 4.6  Chloride 98 - 111 mmol/L 101  CO2 22 - 32 mmol/L 23  Calcium 8.9 - 10.3 mg/dL 8.9  Total Protein 6.5 - 8.1 g/dL 6.9  Total Bilirubin 0.3 - 1.2 mg/dL 0.8  Alkaline Phos 38 - 126 U/L 26(L)  AST 15 - 41 U/L 21  ALT 0 - 44 U/L 14   CBC Latest Ref Rng & Units 02/24/2018  WBC 3.8 - 10.6 K/uL 3.5(L)  Hemoglobin 13.0 - 18.0 g/dL 7.6(L)  Hematocrit 40.0 - 52.0 % 25.7(L)  Platelets 150 - 440 K/uL 245   Patient's workup including stool occult was negative. UA negative for hematuria. Normal P71 and folic levels. normal TSH. Negative hemolysis workup Inappropriate normal reticulocyte count.   09/22/2017  Bone marrow biopsy Diagnosis Bone Marrow, Aspirate,Biopsy, and Clot, right iliac - HYPERCELLULAR BONE MARROW FOR AGE WITH DYSPOIETIC CHANGES.- SEE COMMENT. PERIPHERAL BLOOD: - NORMOCYTIC-HYPOCHROMIC ANEMIA. - LEUKOPENIA. Diagnosis Note The bone marrow is hypercellular for age with dyspoietic changes variably involving myeloid cell lines, but with main involvement of the granulocytic/monocytic cell line. This is associated with bone marrow monocytosis and borderline number to slight increase in blastic cells. The latter is mainly seen by immunohistochemistry. The overall changes favor a primary myeloid neoplasm, particularly a myelodysplastic syndrome especially refractory anemia with excess blasts (RAEB-1) or possibly refractory cytopenia with multilineage dysplasia. Consideration was also given to an evolving myelodysplastic/myeloproliferative neoplasm such as chronic myelomonocytic leukemia but  the lack of absolute peripheral monocytosis  precludes such a diagnosis at this time. Correlation with cytogenetic studies is recommended. (BNS:ah:ecj 09/24/17) The blastic cells represent 5% of all cells Bone Marrow Flow Cytometry - NO SIGNIFICANT CD34 [place] one testing positive for  ASXL1 T248LY*59 EZH2 Splice site 093-1P>E RUNX1 A838s*56 IPSS -R 4.5, intermediate group. 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 With median survival 1.6 years.   Assessment and plan MDS: S/p 4 cycles of Vidaza Day 1-5 q 28 days.  Given the next week is a holiday, will move Vidaza out 1 week.  We will have him return next week for labs only and then return to clinic for cycle 5 of Vidaza on 7/8-7/12.  Patient is in agreement with plan.   Anemia: Multifactorial.  Secondary to MDS and chemotherapy.  Today has improved post PRBC and is 7.6.  He is to return to clinic next week for labs only to assess hemoglobin.  RTC on 7/8 for Cycle 5 of Vidaza D 1-5 d/t holiday.  RTC on 7/3 for labs only (H&H).  Patient and wife know to call with concerns in the interim.  Greater than 50% was spent in counseling and coordination of care with this patient including but not limited to discussion of the relevant topics above (See A&P) including, but not limited to diagnosis and management of acute and chronic medical conditions.   Faythe Casa, NP 02/24/2018 11:06 AM

## 2018-02-24 NOTE — Progress Notes (Signed)
Patient here for follow up. Pt has broken teeth and is concerned if he should go forth with treatment since he's doing chemotherapy.

## 2018-03-02 ENCOUNTER — Inpatient Hospital Stay: Payer: Medicare Other | Attending: Oncology

## 2018-03-02 DIAGNOSIS — D469 Myelodysplastic syndrome, unspecified: Secondary | ICD-10-CM | POA: Insufficient documentation

## 2018-03-02 DIAGNOSIS — D630 Anemia in neoplastic disease: Secondary | ICD-10-CM | POA: Insufficient documentation

## 2018-03-02 DIAGNOSIS — Z79899 Other long term (current) drug therapy: Secondary | ICD-10-CM | POA: Diagnosis not present

## 2018-03-02 LAB — CBC WITH DIFFERENTIAL/PLATELET
BASOS ABS: 0 10*3/uL (ref 0–0.1)
Basophils Relative: 0 %
Eosinophils Absolute: 0 10*3/uL (ref 0–0.7)
Eosinophils Relative: 0 %
HCT: 25.6 % — ABNORMAL LOW (ref 40.0–52.0)
HEMOGLOBIN: 7.5 g/dL — AB (ref 13.0–18.0)
LYMPHS ABS: 1.5 10*3/uL (ref 1.0–3.6)
LYMPHS PCT: 39 %
MCH: 26.1 pg (ref 26.0–34.0)
MCHC: 29.2 g/dL — ABNORMAL LOW (ref 32.0–36.0)
MCV: 89.4 fL (ref 80.0–100.0)
Monocytes Absolute: 0.5 10*3/uL (ref 0.2–1.0)
Monocytes Relative: 14 %
NEUTROS PCT: 47 %
Neutro Abs: 1.7 10*3/uL (ref 1.4–6.5)
Platelets: 310 10*3/uL (ref 150–440)
RBC: 2.86 MIL/uL — AB (ref 4.40–5.90)
RDW: 21.4 % — ABNORMAL HIGH (ref 11.5–14.5)
WBC: 3.7 10*3/uL — AB (ref 3.8–10.6)

## 2018-03-02 LAB — SAMPLE TO BLOOD BANK

## 2018-03-07 ENCOUNTER — Inpatient Hospital Stay: Payer: Medicare Other

## 2018-03-07 ENCOUNTER — Other Ambulatory Visit: Payer: Self-pay | Admitting: Oncology

## 2018-03-07 VITALS — BP 138/62 | HR 78 | Temp 98.3°F | Resp 18

## 2018-03-07 DIAGNOSIS — Z7189 Other specified counseling: Secondary | ICD-10-CM

## 2018-03-07 DIAGNOSIS — D630 Anemia in neoplastic disease: Secondary | ICD-10-CM

## 2018-03-07 DIAGNOSIS — D469 Myelodysplastic syndrome, unspecified: Secondary | ICD-10-CM

## 2018-03-07 LAB — CBC WITH DIFFERENTIAL/PLATELET
Basophils Absolute: 0 10*3/uL (ref 0–0.1)
Basophils Relative: 0 %
Eosinophils Absolute: 0 10*3/uL (ref 0–0.7)
Eosinophils Relative: 0 %
HCT: 25.9 % — ABNORMAL LOW (ref 40.0–52.0)
Hemoglobin: 7.6 g/dL — ABNORMAL LOW (ref 13.0–18.0)
Lymphocytes Relative: 44 %
Lymphs Abs: 1.8 10*3/uL (ref 1.0–3.6)
MCH: 25.9 pg — ABNORMAL LOW (ref 26.0–34.0)
MCHC: 29.4 g/dL — ABNORMAL LOW (ref 32.0–36.0)
MCV: 88.1 fL (ref 80.0–100.0)
Monocytes Absolute: 0.5 10*3/uL (ref 0.2–1.0)
Monocytes Relative: 11 %
Neutro Abs: 1.8 10*3/uL (ref 1.4–6.5)
Neutrophils Relative %: 45 %
Platelets: 358 10*3/uL (ref 150–440)
RBC: 2.94 MIL/uL — ABNORMAL LOW (ref 4.40–5.90)
RDW: 20.7 % — ABNORMAL HIGH (ref 11.5–14.5)
WBC: 4.1 10*3/uL (ref 3.8–10.6)

## 2018-03-07 LAB — SAMPLE TO BLOOD BANK

## 2018-03-07 MED ORDER — ONDANSETRON HCL 4 MG PO TABS
8.0000 mg | ORAL_TABLET | Freq: Once | ORAL | Status: AC
  Administered 2018-03-07: 8 mg via ORAL

## 2018-03-07 MED ORDER — AZACITIDINE CHEMO SQ INJECTION
75.0000 mg/m2 | Freq: Once | INTRAMUSCULAR | Status: AC
  Administered 2018-03-07: 155 mg via SUBCUTANEOUS
  Filled 2018-03-07: qty 6.2

## 2018-03-07 NOTE — Patient Instructions (Signed)
Azacitidine suspension for injection (subcutaneous use) What is this medicine? AZACITIDINE (ay za SITE i deen) is a chemotherapy drug. This medicine reduces the growth of cancer cells and can suppress the immune system. It is used for treating myelodysplastic syndrome or some types of leukemia. This medicine may be used for other purposes; ask your health care provider or pharmacist if you have questions. COMMON BRAND NAME(S): Vidaza What should I tell my health care provider before I take this medicine? They need to know if you have any of these conditions: -kidney disease -liver disease -liver tumors -an unusual or allergic reaction to azacitidine, mannitol, other medicines, foods, dyes, or preservatives -pregnant or trying to get pregnant -breast-feeding How should I use this medicine? This medicine is for injection under the skin. It is administered in a hospital or clinic by a specially trained health care professional. Talk to your pediatrician regarding the use of this medicine in children. While this drug may be prescribed for selected conditions, precautions do apply. Overdosage: If you think you have taken too much of this medicine contact a poison control center or emergency room at once. NOTE: This medicine is only for you. Do not share this medicine with others. What if I miss a dose? It is important not to miss your dose. Call your doctor or health care professional if you are unable to keep an appointment. What may interact with this medicine? Interactions have not been studied. Give your health care provider a list of all the medicines, herbs, non-prescription drugs, or dietary supplements you use. Also tell them if you smoke, drink alcohol, or use illegal drugs. Some items may interact with your medicine. This list may not describe all possible interactions. Give your health care provider a list of all the medicines, herbs, non-prescription drugs, or dietary supplements you  use. Also tell them if you smoke, drink alcohol, or use illegal drugs. Some items may interact with your medicine. What should I watch for while using this medicine? Visit your doctor for checks on your progress. This drug may make you feel generally unwell. This is not uncommon, as chemotherapy can affect healthy cells as well as cancer cells. Report any side effects. Continue your course of treatment even though you feel ill unless your doctor tells you to stop. In some cases, you may be given additional medicines to help with side effects. Follow all directions for their use. Call your doctor or health care professional for advice if you get a fever, chills or sore throat, or other symptoms of a cold or flu. Do not treat yourself. This drug decreases your body's ability to fight infections. Try to avoid being around people who are sick. This medicine may increase your risk to bruise or bleed. Call your doctor or health care professional if you notice any unusual bleeding. You may need blood work done while you are taking this medicine. Do not become pregnant while taking this medicine and for 6 months after the last dose. Women should inform their doctor if they wish to become pregnant or think they might be pregnant. Men should not father a child while taking this medicine and for 3 months after the last dose. There is a potential for serious side effects to an unborn child. Talk to your health care professional or pharmacist for more information. Do not breast-feed an infant while taking this medicine and for 1 week after the last dose. This medicine may interfere with the ability to have a child.   Talk with your doctor or health care professional if you are concerned about your fertility. What side effects may I notice from receiving this medicine? Side effects that you should report to your doctor or health care professional as soon as possible: -allergic reactions like skin rash, itching or hives,  swelling of the face, lips, or tongue -low blood counts - this medicine may decrease the number of white blood cells, red blood cells and platelets. You may be at increased risk for infections and bleeding. -signs of infection - fever or chills, cough, sore throat, pain passing urine -signs of decreased platelets or bleeding - bruising, pinpoint red spots on the skin, black, tarry stools, blood in the urine -signs of decreased red blood cells - unusually weak or tired, fainting spells, lightheadedness -signs and symptoms of kidney injury like trouble passing urine or change in the amount of urine -signs and symptoms of liver injury like dark yellow or brown urine; general ill feeling or flu-like symptoms; light-colored stools; loss of appetite; nausea; right upper belly pain; unusually weak or tired; yellowing of the eyes or skin Side effects that usually do not require medical attention (report to your doctor or health care professional if they continue or are bothersome): -constipation -diarrhea -nausea, vomiting -pain or redness at the injection site -unusually weak or tired This list may not describe all possible side effects. Call your doctor for medical advice about side effects. You may report side effects to FDA at 1-800-FDA-1088. Where should I keep my medicine? This drug is given in a hospital or clinic and will not be stored at home. NOTE: This sheet is a summary. It may not cover all possible information. If you have questions about this medicine, talk to your doctor, pharmacist, or health care provider.  2018 Elsevier/Gold Standard (2016-09-15 14:37:51)  

## 2018-03-08 ENCOUNTER — Inpatient Hospital Stay: Payer: Medicare Other

## 2018-03-08 VITALS — BP 122/58 | HR 78 | Temp 97.2°F | Resp 18

## 2018-03-08 DIAGNOSIS — D469 Myelodysplastic syndrome, unspecified: Secondary | ICD-10-CM

## 2018-03-08 DIAGNOSIS — Z7189 Other specified counseling: Secondary | ICD-10-CM

## 2018-03-08 MED ORDER — ONDANSETRON HCL 4 MG PO TABS
8.0000 mg | ORAL_TABLET | Freq: Once | ORAL | Status: AC
  Administered 2018-03-08: 8 mg via ORAL
  Filled 2018-03-08: qty 2

## 2018-03-08 MED ORDER — AZACITIDINE CHEMO SQ INJECTION
75.0000 mg/m2 | Freq: Once | INTRAMUSCULAR | Status: AC
  Administered 2018-03-08: 155 mg via SUBCUTANEOUS
  Filled 2018-03-08 (×2): qty 6.2

## 2018-03-08 NOTE — Patient Instructions (Signed)
Azacitidine suspension for injection (subcutaneous use) What is this medicine? AZACITIDINE (ay za SITE i deen) is a chemotherapy drug. This medicine reduces the growth of cancer cells and can suppress the immune system. It is used for treating myelodysplastic syndrome or some types of leukemia. This medicine may be used for other purposes; ask your health care provider or pharmacist if you have questions. COMMON BRAND NAME(S): Vidaza What should I tell my health care provider before I take this medicine? They need to know if you have any of these conditions: -kidney disease -liver disease -liver tumors -an unusual or allergic reaction to azacitidine, mannitol, other medicines, foods, dyes, or preservatives -pregnant or trying to get pregnant -breast-feeding How should I use this medicine? This medicine is for injection under the skin. It is administered in a hospital or clinic by a specially trained health care professional. Talk to your pediatrician regarding the use of this medicine in children. While this drug may be prescribed for selected conditions, precautions do apply. Overdosage: If you think you have taken too much of this medicine contact a poison control center or emergency room at once. NOTE: This medicine is only for you. Do not share this medicine with others. What if I miss a dose? It is important not to miss your dose. Call your doctor or health care professional if you are unable to keep an appointment. What may interact with this medicine? Interactions have not been studied. Give your health care provider a list of all the medicines, herbs, non-prescription drugs, or dietary supplements you use. Also tell them if you smoke, drink alcohol, or use illegal drugs. Some items may interact with your medicine. This list may not describe all possible interactions. Give your health care provider a list of all the medicines, herbs, non-prescription drugs, or dietary supplements you  use. Also tell them if you smoke, drink alcohol, or use illegal drugs. Some items may interact with your medicine. What should I watch for while using this medicine? Visit your doctor for checks on your progress. This drug may make you feel generally unwell. This is not uncommon, as chemotherapy can affect healthy cells as well as cancer cells. Report any side effects. Continue your course of treatment even though you feel ill unless your doctor tells you to stop. In some cases, you may be given additional medicines to help with side effects. Follow all directions for their use. Call your doctor or health care professional for advice if you get a fever, chills or sore throat, or other symptoms of a cold or flu. Do not treat yourself. This drug decreases your body's ability to fight infections. Try to avoid being around people who are sick. This medicine may increase your risk to bruise or bleed. Call your doctor or health care professional if you notice any unusual bleeding. You may need blood work done while you are taking this medicine. Do not become pregnant while taking this medicine and for 6 months after the last dose. Women should inform their doctor if they wish to become pregnant or think they might be pregnant. Men should not father a child while taking this medicine and for 3 months after the last dose. There is a potential for serious side effects to an unborn child. Talk to your health care professional or pharmacist for more information. Do not breast-feed an infant while taking this medicine and for 1 week after the last dose. This medicine may interfere with the ability to have a child.   Talk with your doctor or health care professional if you are concerned about your fertility. What side effects may I notice from receiving this medicine? Side effects that you should report to your doctor or health care professional as soon as possible: -allergic reactions like skin rash, itching or hives,  swelling of the face, lips, or tongue -low blood counts - this medicine may decrease the number of white blood cells, red blood cells and platelets. You may be at increased risk for infections and bleeding. -signs of infection - fever or chills, cough, sore throat, pain passing urine -signs of decreased platelets or bleeding - bruising, pinpoint red spots on the skin, black, tarry stools, blood in the urine -signs of decreased red blood cells - unusually weak or tired, fainting spells, lightheadedness -signs and symptoms of kidney injury like trouble passing urine or change in the amount of urine -signs and symptoms of liver injury like dark yellow or brown urine; general ill feeling or flu-like symptoms; light-colored stools; loss of appetite; nausea; right upper belly pain; unusually weak or tired; yellowing of the eyes or skin Side effects that usually do not require medical attention (report to your doctor or health care professional if they continue or are bothersome): -constipation -diarrhea -nausea, vomiting -pain or redness at the injection site -unusually weak or tired This list may not describe all possible side effects. Call your doctor for medical advice about side effects. You may report side effects to FDA at 1-800-FDA-1088. Where should I keep my medicine? This drug is given in a hospital or clinic and will not be stored at home. NOTE: This sheet is a summary. It may not cover all possible information. If you have questions about this medicine, talk to your doctor, pharmacist, or health care provider.  2018 Elsevier/Gold Standard (2016-09-15 14:37:51)  

## 2018-03-09 ENCOUNTER — Inpatient Hospital Stay: Payer: Medicare Other

## 2018-03-09 VITALS — BP 129/63 | HR 78 | Temp 98.1°F | Resp 18

## 2018-03-09 DIAGNOSIS — D469 Myelodysplastic syndrome, unspecified: Secondary | ICD-10-CM

## 2018-03-09 DIAGNOSIS — Z7189 Other specified counseling: Secondary | ICD-10-CM

## 2018-03-09 MED ORDER — AZACITIDINE CHEMO SQ INJECTION
75.0000 mg/m2 | Freq: Once | INTRAMUSCULAR | Status: AC
  Administered 2018-03-09: 155 mg via SUBCUTANEOUS
  Filled 2018-03-09: qty 6.2

## 2018-03-09 MED ORDER — AZACITIDINE CHEMO SQ INJECTION: 75.0000 mg/m2 | Freq: Once | INTRAMUSCULAR | Status: DC

## 2018-03-09 MED ORDER — ONDANSETRON HCL 4 MG PO TABS
ORAL_TABLET | ORAL | Status: AC
  Filled 2018-03-09: qty 2

## 2018-03-09 MED ORDER — ONDANSETRON HCL 4 MG PO TABS
8.0000 mg | ORAL_TABLET | Freq: Once | ORAL | Status: AC
  Administered 2018-03-09: 8 mg via ORAL

## 2018-03-10 ENCOUNTER — Inpatient Hospital Stay: Payer: Medicare Other

## 2018-03-10 VITALS — BP 126/61 | HR 77 | Temp 98.2°F | Resp 18

## 2018-03-10 DIAGNOSIS — Z7189 Other specified counseling: Secondary | ICD-10-CM

## 2018-03-10 DIAGNOSIS — D469 Myelodysplastic syndrome, unspecified: Secondary | ICD-10-CM | POA: Diagnosis not present

## 2018-03-10 MED ORDER — AZACITIDINE CHEMO SQ INJECTION
75.0000 mg/m2 | Freq: Once | INTRAMUSCULAR | Status: AC
Start: 2018-03-10 — End: 2018-03-10
  Administered 2018-03-10: 155 mg via SUBCUTANEOUS
  Filled 2018-03-10: qty 6.2

## 2018-03-10 MED ORDER — ONDANSETRON HCL 4 MG PO TABS
8.0000 mg | ORAL_TABLET | Freq: Once | ORAL | Status: AC
  Administered 2018-03-10: 8 mg via ORAL

## 2018-03-11 ENCOUNTER — Inpatient Hospital Stay: Payer: Medicare Other

## 2018-03-11 VITALS — BP 110/54 | HR 80 | Temp 98.8°F | Resp 18

## 2018-03-11 DIAGNOSIS — D469 Myelodysplastic syndrome, unspecified: Secondary | ICD-10-CM | POA: Diagnosis not present

## 2018-03-11 DIAGNOSIS — Z7189 Other specified counseling: Secondary | ICD-10-CM

## 2018-03-11 MED ORDER — ONDANSETRON HCL 4 MG PO TABS
8.0000 mg | ORAL_TABLET | Freq: Once | ORAL | Status: AC
  Administered 2018-03-11: 8 mg via ORAL
  Filled 2018-03-11: qty 2

## 2018-03-11 MED ORDER — AZACITIDINE CHEMO SQ INJECTION
75.0000 mg/m2 | Freq: Once | INTRAMUSCULAR | Status: AC
  Administered 2018-03-11: 155 mg via SUBCUTANEOUS
  Filled 2018-03-11: qty 6.2

## 2018-03-17 ENCOUNTER — Inpatient Hospital Stay: Payer: Medicare Other

## 2018-03-17 ENCOUNTER — Ambulatory Visit: Payer: TRICARE For Life (TFL) | Admitting: Oncology

## 2018-03-17 VITALS — BP 135/63 | HR 90 | Temp 98.1°F | Resp 18

## 2018-03-17 DIAGNOSIS — D630 Anemia in neoplastic disease: Secondary | ICD-10-CM

## 2018-03-17 DIAGNOSIS — D469 Myelodysplastic syndrome, unspecified: Secondary | ICD-10-CM | POA: Diagnosis not present

## 2018-03-17 LAB — CBC WITH DIFFERENTIAL/PLATELET
BASOS PCT: 0 %
Basophils Absolute: 0 10*3/uL (ref 0–0.1)
EOS ABS: 0 10*3/uL (ref 0–0.7)
EOS PCT: 1 %
HCT: 23.4 % — ABNORMAL LOW (ref 40.0–52.0)
HEMOGLOBIN: 7 g/dL — AB (ref 13.0–18.0)
Lymphocytes Relative: 43 %
Lymphs Abs: 1.8 10*3/uL (ref 1.0–3.6)
MCH: 25.9 pg — AB (ref 26.0–34.0)
MCHC: 29.9 g/dL — AB (ref 32.0–36.0)
MCV: 86.8 fL (ref 80.0–100.0)
Monocytes Absolute: 0.5 10*3/uL (ref 0.2–1.0)
Monocytes Relative: 12 %
NEUTROS ABS: 1.9 10*3/uL (ref 1.4–6.5)
NEUTROS PCT: 44 %
Platelets: 358 10*3/uL (ref 150–440)
RBC: 2.7 MIL/uL — ABNORMAL LOW (ref 4.40–5.90)
RDW: 19.5 % — AB (ref 11.5–14.5)
WBC: 4.2 10*3/uL (ref 3.8–10.6)

## 2018-03-17 MED ORDER — DARBEPOETIN ALFA 150 MCG/0.3ML IJ SOSY
300.0000 ug | PREFILLED_SYRINGE | INTRAMUSCULAR | Status: DC
  Administered 2018-03-17: 300 ug via SUBCUTANEOUS

## 2018-03-17 NOTE — Patient Instructions (Signed)

## 2018-03-24 ENCOUNTER — Inpatient Hospital Stay: Payer: Medicare Other

## 2018-03-24 VITALS — BP 108/58 | HR 68 | Temp 96.8°F | Resp 16

## 2018-03-24 DIAGNOSIS — D469 Myelodysplastic syndrome, unspecified: Secondary | ICD-10-CM

## 2018-03-24 DIAGNOSIS — D630 Anemia in neoplastic disease: Secondary | ICD-10-CM

## 2018-03-24 LAB — COMPREHENSIVE METABOLIC PANEL
ALBUMIN: 4.3 g/dL (ref 3.5–5.0)
ALK PHOS: 28 U/L — AB (ref 38–126)
ALT: 17 U/L (ref 0–44)
ANION GAP: 10 (ref 5–15)
AST: 21 U/L (ref 15–41)
BILIRUBIN TOTAL: 0.8 mg/dL (ref 0.3–1.2)
BUN: 23 mg/dL (ref 8–23)
CALCIUM: 8.8 mg/dL — AB (ref 8.9–10.3)
CO2: 24 mmol/L (ref 22–32)
Chloride: 103 mmol/L (ref 98–111)
Creatinine, Ser: 0.71 mg/dL (ref 0.61–1.24)
GFR calc Af Amer: >60 mL/min (ref >60)
Glucose, Bld: 187 mg/dL — ABNORMAL HIGH (ref 70–99)
Potassium: 4.8 mmol/L (ref 3.5–5.1)
Sodium: 137 mmol/L (ref 135–145)
TOTAL PROTEIN: 7 g/dL (ref 6.5–8.1)

## 2018-03-24 LAB — CBC WITH DIFFERENTIAL/PLATELET
BASOS ABS: 0 10*3/uL (ref 0–0.1)
BASOS PCT: 0 %
EOS PCT: 1 %
Eosinophils Absolute: 0 10*3/uL (ref 0–0.7)
HCT: 22 % — ABNORMAL LOW (ref 40.0–52.0)
Hemoglobin: 6.6 g/dL — ABNORMAL LOW (ref 13.0–18.0)
Lymphocytes Relative: 37 %
Lymphs Abs: 1.5 10*3/uL (ref 1.0–3.6)
MCH: 26.2 pg (ref 26.0–34.0)
MCHC: 29.8 g/dL — ABNORMAL LOW (ref 32.0–36.0)
MCV: 88.1 fL (ref 80.0–100.0)
MONO ABS: 0.5 10*3/uL (ref 0.2–1.0)
Monocytes Relative: 12 %
NEUTROS ABS: 2.1 10*3/uL (ref 1.4–6.5)
Neutrophils Relative %: 50 %
PLATELETS: 177 10*3/uL (ref 150–440)
RBC: 2.5 MIL/uL — ABNORMAL LOW (ref 4.40–5.90)
RDW: 22 % — AB (ref 11.5–14.5)
WBC: 4.2 10*3/uL (ref 3.8–10.6)

## 2018-03-24 MED ORDER — DARBEPOETIN ALFA 150 MCG/0.3ML IJ SOSY
300.0000 ug | PREFILLED_SYRINGE | INTRAMUSCULAR | Status: DC
  Administered 2018-03-24: 300 ug via SUBCUTANEOUS

## 2018-03-24 NOTE — Progress Notes (Signed)
Patient's hemoglobin today was 6.6.  He stated that he has a little dizziness when bending over to pick up sticks.  Wife and patient were advised to call Dr. Tasia Catchings if his symptoms worsen.  Patient given his Aranesp injection today and a copy of his labs.

## 2018-03-24 NOTE — Patient Instructions (Signed)

## 2018-03-31 ENCOUNTER — Inpatient Hospital Stay: Payer: Medicare Other | Attending: Oncology | Admitting: Oncology

## 2018-03-31 ENCOUNTER — Encounter: Payer: Self-pay | Admitting: Oncology

## 2018-03-31 ENCOUNTER — Other Ambulatory Visit: Payer: Self-pay

## 2018-03-31 ENCOUNTER — Inpatient Hospital Stay: Payer: Medicare Other

## 2018-03-31 VITALS — BP 124/48 | HR 74 | Temp 97.7°F | Wt 177.5 lb

## 2018-03-31 DIAGNOSIS — D469 Myelodysplastic syndrome, unspecified: Secondary | ICD-10-CM

## 2018-03-31 DIAGNOSIS — T451X5A Adverse effect of antineoplastic and immunosuppressive drugs, initial encounter: Secondary | ICD-10-CM

## 2018-03-31 DIAGNOSIS — Z5111 Encounter for antineoplastic chemotherapy: Secondary | ICD-10-CM

## 2018-03-31 DIAGNOSIS — D6481 Anemia due to antineoplastic chemotherapy: Secondary | ICD-10-CM

## 2018-03-31 DIAGNOSIS — D630 Anemia in neoplastic disease: Secondary | ICD-10-CM

## 2018-03-31 DIAGNOSIS — Z79899 Other long term (current) drug therapy: Secondary | ICD-10-CM | POA: Diagnosis not present

## 2018-03-31 DIAGNOSIS — D649 Anemia, unspecified: Secondary | ICD-10-CM

## 2018-03-31 LAB — CBC WITH DIFFERENTIAL/PLATELET
BASOS ABS: 0 10*3/uL (ref 0–0.1)
Basophils Relative: 0 %
EOS ABS: 0 10*3/uL (ref 0–0.7)
Eosinophils Relative: 1 %
HCT: 23.9 % — ABNORMAL LOW (ref 40.0–52.0)
Hemoglobin: 6.9 g/dL — ABNORMAL LOW (ref 13.0–18.0)
LYMPHS ABS: 1.5 10*3/uL (ref 1.0–3.6)
Lymphocytes Relative: 34 %
MCH: 26.1 pg (ref 26.0–34.0)
MCHC: 29 g/dL — ABNORMAL LOW (ref 32.0–36.0)
MCV: 89.7 fL (ref 80.0–100.0)
MONO ABS: 0.3 10*3/uL (ref 0.2–1.0)
Monocytes Relative: 8 %
NEUTROS ABS: 2.5 10*3/uL (ref 1.4–6.5)
NEUTROS PCT: 57 %
PLATELETS: 268 10*3/uL (ref 150–440)
RBC: 2.66 MIL/uL — ABNORMAL LOW (ref 4.40–5.90)
RDW: 22.6 % — AB (ref 11.5–14.5)
WBC: 4.3 10*3/uL (ref 3.8–10.6)

## 2018-03-31 MED ORDER — DARBEPOETIN ALFA 150 MCG/0.3ML IJ SOSY
300.0000 ug | PREFILLED_SYRINGE | INTRAMUSCULAR | Status: DC
  Administered 2018-03-31: 300 ug via SUBCUTANEOUS

## 2018-03-31 NOTE — Progress Notes (Signed)
Hematology/Oncology Follow Up Note [facility]  [phone] [phone]  Patient Care Team: Tracie Harrier, MD as PCP - General (Internal Medicine)   Name of the patient: Paul Pham  [id number]  09-09-1925   REASON FOR VISIT  follow-up for management of anemia. HISTORY OF PRESENT ILLNESS Paul Pham. is a  84 y.o.  male with PMH listed below who was present as a follow-up after hospital admission for management of anemia. Patient was seen during his most recent admission on 08/18/2017. Patient is heart hearing. He has been feeling increased fatigue, dyspnea, and hemoglobin has dropped from baseline 10 to 7 within the past couple of months. Last colonoscopy was done 10 years ago. Outpatient stool occult test is negative. Patient also takes iron pills and stool is always black.  # He has had extensive workup including urinalysis negative for hematuria, normal Y65 and folic level. Normal TSH, negative hemolysis workup. Her stool occult  is being negative as well. 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 Patient and his family members understand that this condition is not curable.   Treatment:  Azacitidine 75 mg/m2  Day 1-5 of every 28 days cycle.  Cycle 1 3/7-3/8, 3/11-13 cycle 2  12/06/17- 12/10/17, day 1-5 Cycle 3 5/6 -5/10. Cycle 4 6/3-02/04/18 Cycle 5 7/8- 03/11/2018 Cycle 6 8/5- 8/9   Aranesp 125mg weekly  INTERVAL HISTORY Paul Pham is a 84y.o. male who has above history reviewed by me today presents for follow-up visit for management of MDS. He is status post 4 cycles of azacitidine.  Reports doing well at baseline. # Continues to have chronic fatigue He remains active at home doing some house work. He feels tired if he does  too much.   Appetite is good. Denies chest pain, SOB, abd pain. Nausea or vomiting.     Review of systems Review of Systems  Constitutional: Positive for malaise/fatigue. Negative for chills, diaphoresis, fever and weight loss.  HENT: Negative for congestion, ear discharge, ear pain, hearing loss, nosebleeds, sinus pain, sore throat and tinnitus.   Eyes: Negative for blurred vision, double vision, photophobia, pain, discharge and redness.  Respiratory: Negative for cough, hemoptysis, sputum production, shortness of breath and wheezing.        Shortness of breath with exertion  Cardiovascular: Negative for chest pain, palpitations, orthopnea, claudication and leg swelling.  Gastrointestinal: Negative for abdominal pain, blood in stool, constipation, diarrhea, heartburn, melena, nausea and vomiting.  Genitourinary: Negative for dysuria, flank pain, frequency, hematuria and urgency.  Musculoskeletal: Negative for back pain, myalgias and neck pain.  Skin: Negative for itching and rash.  Neurological: Negative for dizziness, tingling, tremors, sensory change, speech change, focal weakness, weakness and headaches.  Endo/Heme/Allergies: Negative for environmental allergies. Does not bruise/bleed easily.  Psychiatric/Behavioral: Negative for depression, hallucinations, substance abuse and suicidal ideas. The patient is not nervous/anxious.     No Known Allergies   Past Medical History:  Diagnosis Date  . Anemia   . Arthritis   . BPH (benign prostatic hypertrophy)   . Complication of anesthesia    nausea  . Diabetes type 2, controlled ([facility]olva   . HOH (hard of hearing)    r and L ears-70% loss per pt  . Hyperlipidemia   . Hypertension  Past Surgical History:  Procedure Laterality Date  . APPENDECTOMY    . COLONOSCOPY  09/21/08   1 polyp found, tubular adenoma  . HAND SURGERY    . KNEE SURGERY Left     Social History   Socioeconomic History  . Marital status: Married     Spouse name: Not on file  . Number of children: Not on file  . Years of education: Not on file  . Highest education level: Not on file  Occupational History  . Not on file  Social Needs  . Financial resource strain: Not on file  . Food insecurity:    Worry: Not on file    Inability: Not on file  . Transportation needs:    Medical: Not on file    Non-medical: Not on file  Tobacco Use  . Smoking status: Never Smoker  . Smokeless tobacco: Never Used  Substance and Sexual Activity  . Alcohol use: No    Alcohol/week: 0.0 oz  . Drug use: No  . Sexual activity: Not on file  Lifestyle  . Physical activity:    Days per week: Not on file    Minutes per session: Not on file  . Stress: Not on file  Relationships  . Social connections:    Talks on phone: Not on file    Gets together: Not on file    Attends religious service: Not on file    Active member of club or organization: Not on file    Attends meetings of clubs or organizations: Not on file    Relationship status: Not on file  . Intimate partner violence:    Fear of current or ex partner: Not on file    Emotionally abused: Not on file    Physically abused: Not on file    Forced sexual activity: Not on file  Other Topics Concern  . Not on file  Social History Narrative  . Not on file    Family History  Problem Relation Age of Onset  . Aneurysm Mother   . Heart disease Father   . Cancer Brother        skin  . Heart disease Brother   . Heart disease Brother   . Cancer Sister        skin  . Aneurysm Brother   . Heart disease Brother   . Heart disease Sister   . Heart disease Sister   . COPD Sister   . Diabetes Sister      Current Outpatient Medications:  .  aspirin EC 81 MG tablet, Take 81 mg by mouth daily., Disp: , Rfl:  .  Cranberry (THERACRAN PO), Take by mouth., Disp: , Rfl:  .  cyanocobalamin 1000 MCG tablet, Take 1,000 mcg by mouth daily., Disp: , Rfl:  .  finasteride (PROSCAR) 5 MG tablet, Take 5 mg  by mouth daily., Disp: , Rfl:  .  glucose blood (PRECISION QID TEST) test strip, Use 3 (three) times daily. Use as instructed., Disp: , Rfl:  .  lisinopril (PRINIVIL,ZESTRIL) 10 MG tablet, Take 10 mg by mouth daily., Disp: , Rfl:  .  loperamide (IMODIUM) 2 MG capsule, Take 1 capsule (2 mg total) by mouth See admin instructions. With onset of loose stool, take '4mg'$  followed by '2mg'$  every 2 hours until loose bowel movement stopped. Maximum: 16 mg/day, Disp: 30 capsule, Rfl: 0 .  metformin (FORTAMET) 1000 MG (OSM) 24 hr tablet, Take 1,000 mg by mouth 2 (two) times daily with a  meal., Disp: , Rfl:  .  Multiple Vitamins-Minerals (CENTRUM SILVER PO), Take by mouth., Disp: , Rfl:  .  Omega-3 Fatty Acids (FISH OIL PO), Take by mouth., Disp: , Rfl:  .  ondansetron (ZOFRAN) 4 MG tablet, Take 1 tablet (4 mg total) by mouth every 6 (six) hours as needed for nausea or vomiting., Disp: 30 tablet, Rfl: 0 .  Polyethylene Glycol 3350 (MIRALAX PO), Take by mouth., Disp: , Rfl:  .  Saw Palmetto-Phytosterols (PROSTATE SR PO), Take by mouth., Disp: , Rfl:  .  simvastatin (ZOCOR) 40 MG tablet, Take 40 mg by mouth daily., Disp: , Rfl:  No current facility-administered medications for this visit.   Facility-Administered Medications Ordered in Other Visits:  .  Darbepoetin Alfa (ARANESP) injection 300 mcg, 300 mcg, Subcutaneous, Q7 days, Earlie Server, MD, 300 mcg at 03/31/18 1046  Physical exam:  Vitals:   03/31/18 0959  BP: (!) 124/48  Pulse: 74  Temp: 97.7 F (36.5 C)  TempSrc: Oral  Weight: 177 lb 7.5 oz (80.5 kg)    Physical Exam  Constitutional: He is oriented to person, place, and time and well-developed, well-nourished, and in no distress. No distress.  HENT:  Head: Normocephalic and atraumatic.  Nose: Nose normal.  Mouth/Throat: Oropharynx is clear and moist. No oropharyngeal exudate.  Eyes: Pupils are equal, round, and reactive to light. EOM are normal. Right eye exhibits no discharge. Left eye exhibits no  discharge. No scleral icterus.  Pale conjunctivae   Neck: Normal range of motion. Neck supple. No JVD present.  Cardiovascular: Normal rate, regular rhythm and normal heart sounds.  No murmur heard. Pulmonary/Chest: Effort normal and breath sounds normal. No respiratory distress. He has no wheezes. He has no rales. He exhibits no tenderness.  Abdominal: Soft. Bowel sounds are normal. He exhibits no distension and no mass. There is no tenderness. There is no rebound.  Musculoskeletal: Normal range of motion. He exhibits no edema, tenderness or deformity.  Lymphadenopathy:    He has no cervical adenopathy.  Neurological: He is alert and oriented to person, place, and time. No cranial nerve deficit. He exhibits normal muscle tone. Coordination normal.  Skin: Skin is warm and dry. No rash noted. He is not diaphoretic. No erythema. There is pallor.  Psychiatric: Affect and judgment normal.    CMP Latest Ref Rng & Units 03/24/2018  Glucose 70 - 99 mg/dL 187(H)  BUN 8 - 23 mg/dL 23  Creatinine 0.61 - 1.24 mg/dL 0.71  Sodium 135 - 145 mmol/L 137  Potassium 3.5 - 5.1 mmol/L 4.8  Chloride 98 - 111 mmol/L 103  CO2 22 - 32 mmol/L 24  Calcium 8.9 - 10.3 mg/dL 8.8(L)  Total Protein 6.5 - 8.1 g/dL 7.0  Total Bilirubin 0.3 - 1.2 mg/dL 0.8  Alkaline Phos 38 - 126 U/L 28(L)  AST 15 - 41 U/L 21  ALT 0 - 44 U/L 17   CBC Latest Ref Rng & Units 03/31/2018  WBC 3.8 - 10.6 K/uL 4.3  Hemoglobin 13.0 - 18.0 g/dL 6.9(L)  Hematocrit 40.0 - 52.0 % 23.9(L)  Platelets 150 - 440 K/uL 268   Patient's workup including stool occult was negative. UA negative for hematuria. Normal X38 and folic levels. normal TSH. Negative hemolysis workup Inappropriate normal reticulocyte count.   09/22/2017  Bone marrow biopsy Diagnosis Bone Marrow, Aspirate,Biopsy, and Clot, right iliac - HYPERCELLULAR BONE MARROW FOR AGE WITH DYSPOIETIC CHANGES.- SEE COMMENT. PERIPHERAL BLOOD: - NORMOCYTIC-HYPOCHROMIC ANEMIA. -  LEUKOPENIA. Diagnosis Note The  bone marrow is hypercellular for age with dyspoietic changes variably involving myeloid cell lines, but with main involvement of the granulocytic/monocytic cell line. This is associated with bone marrow monocytosis and borderline number to slight increase in blastic cells. The latter is mainly seen by immunohistochemistry. The overall changes favor a primary myeloid neoplasm, particularly a myelodysplastic syndrome especially refractory anemia with excess blasts (RAEB-1) or possibly refractory cytopenia with multilineage dysplasia. Consideration was also given to an evolving myelodysplastic/myeloproliferative neoplasm such as chronic myelomonocytic leukemia but the lack of absolute peripheral monocytosis precludes such a diagnosis at this time. Correlation with cytogenetic studies is recommended. (BNS:ah:ecj 09/24/17) The blastic cells represent 5% of all cells Bone Marrow Flow Cytometry - NO SIGNIFICANT CD34 [place] one testing positive for  ASXL1 X323FT*73 EZH2 Splice site 220-2R>K RUNX1 A838s*56 IPSS -R 4.5, intermediate group. 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 With median survival 1.6 years.   Assessment and plan Patient is a 84 y.o. 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 1. 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 Encounter for antineoplastic chemotherapy   4. Antineoplastic chemotherapy induced anemia   5. Symptomatic anemia    # MDS: Tolerated 3 cycles of Azacitadine 15m/m2 Day 1-5 every 28 days. Labs reviewed. Cbc is acceptable. CMP will be done on 8/5  # Anemia due to MDS/chemotherapy.  Proceed with Aranesp today. Hemoglobin 6.9.  Repeat CBC on 8/5  # Symptomatic anemia, anticipate that he will be more anemic after cycle 6 Azacitadine. Plan transfuse him with one  unit of PRBC on 8/9. Type and screen on 8/8  # Patient asks if he can obtain dental evaluation as he cracks his teeth recently.  ANC is normal currently. Advise patient to get CBC done prior to dental procedure and I will let him know if ok to proceed.   #Patient and his family members to questions and all questions answered to satisfaction.  8/5-8/9 cycle 6 Azacitadine 8/8 Type and screen 8/9 blood transfusion 8/15 and 8/22 cbc and aranesp  Lab/MD in 4 weeks for Aranesp and assessment prior to cycle 7 Azacitadine Patient and family members know to call if any concerns during the interval.  Total face to face encounter time for this patient visit was 25 min. >50% of the time was  spent in counseling and coordination of care.   ZEarlie Server MD, PhD Hematology Oncology CSur[facility] LLCat ATomah Memorial HospitalPager- [phone]/08/2017

## 2018-03-31 NOTE — Progress Notes (Signed)
Patient here today for follow up.  Patient states no new concerns today  

## 2018-03-31 NOTE — Patient Instructions (Signed)

## 2018-04-04 ENCOUNTER — Inpatient Hospital Stay: Payer: Medicare Other

## 2018-04-04 VITALS — BP 124/57 | HR 74 | Temp 95.4°F | Resp 18

## 2018-04-04 DIAGNOSIS — Z7189 Other specified counseling: Secondary | ICD-10-CM

## 2018-04-04 DIAGNOSIS — D630 Anemia in neoplastic disease: Secondary | ICD-10-CM

## 2018-04-04 DIAGNOSIS — D469 Myelodysplastic syndrome, unspecified: Secondary | ICD-10-CM | POA: Diagnosis not present

## 2018-04-04 LAB — CBC WITH DIFFERENTIAL/PLATELET
BASOS PCT: 0 %
Basophils Absolute: 0 10*3/uL (ref 0–0.1)
Eosinophils Absolute: 0 10*3/uL (ref 0–0.7)
Eosinophils Relative: 1 %
HCT: 24.7 % — ABNORMAL LOW (ref 40.0–52.0)
HEMOGLOBIN: 7.1 g/dL — AB (ref 13.0–18.0)
LYMPHS ABS: 1.3 10*3/uL (ref 1.0–3.6)
Lymphocytes Relative: 30 %
MCH: 25.7 pg — AB (ref 26.0–34.0)
MCHC: 28.7 g/dL — ABNORMAL LOW (ref 32.0–36.0)
MCV: 89.3 fL (ref 80.0–100.0)
MONO ABS: 0.5 10*3/uL (ref 0.2–1.0)
Monocytes Relative: 11 %
NEUTROS ABS: 2.6 10*3/uL (ref 1.4–6.5)
NRBC: 1 /100{WBCs} — AB
Neutrophils Relative %: 58 %
PLATELETS: 302 10*3/uL (ref 150–440)
RBC: 2.77 MIL/uL — ABNORMAL LOW (ref 4.40–5.90)
RDW: 22.1 % — AB (ref 11.5–14.5)
WBC: 4.4 10*3/uL (ref 3.8–10.6)

## 2018-04-04 MED ORDER — AZACITIDINE CHEMO SQ INJECTION
75.0000 mg/m2 | Freq: Once | INTRAMUSCULAR | Status: AC
  Administered 2018-04-04: 155 mg via SUBCUTANEOUS
  Filled 2018-04-04 (×2): qty 6.2

## 2018-04-04 MED ORDER — ONDANSETRON HCL 4 MG PO TABS
8.0000 mg | ORAL_TABLET | Freq: Once | ORAL | Status: AC
  Administered 2018-04-04: 8 mg via ORAL
  Filled 2018-04-04: qty 2

## 2018-04-05 ENCOUNTER — Inpatient Hospital Stay: Payer: Medicare Other

## 2018-04-05 VITALS — BP 114/57 | HR 77 | Temp 95.7°F | Resp 18

## 2018-04-05 DIAGNOSIS — D469 Myelodysplastic syndrome, unspecified: Secondary | ICD-10-CM

## 2018-04-05 DIAGNOSIS — Z7189 Other specified counseling: Secondary | ICD-10-CM

## 2018-04-05 MED ORDER — AZACITIDINE CHEMO SQ INJECTION
75.0000 mg/m2 | Freq: Once | INTRAMUSCULAR | Status: AC
  Administered 2018-04-05: 155 mg via SUBCUTANEOUS
  Filled 2018-04-05: qty 6.2

## 2018-04-05 MED ORDER — ONDANSETRON HCL 4 MG PO TABS
8.0000 mg | ORAL_TABLET | Freq: Once | ORAL | Status: AC
  Administered 2018-04-05: 8 mg via ORAL
  Filled 2018-04-05: qty 2

## 2018-04-05 NOTE — Patient Instructions (Signed)
Azacitidine suspension for injection (subcutaneous use) What is this medicine? AZACITIDINE (ay za SITE i deen) is a chemotherapy drug. This medicine reduces the growth of cancer cells and can suppress the immune system. It is used for treating myelodysplastic syndrome or some types of leukemia. This medicine may be used for other purposes; ask your health care provider or pharmacist if you have questions. COMMON BRAND NAME(S): Vidaza What should I tell my health care provider before I take this medicine? They need to know if you have any of these conditions: -kidney disease -liver disease -liver tumors -an unusual or allergic reaction to azacitidine, mannitol, other medicines, foods, dyes, or preservatives -pregnant or trying to get pregnant -breast-feeding How should I use this medicine? This medicine is for injection under the skin. It is administered in a hospital or clinic by a specially trained health care professional. Talk to your pediatrician regarding the use of this medicine in children. While this drug may be prescribed for selected conditions, precautions do apply. Overdosage: If you think you have taken too much of this medicine contact a poison control center or emergency room at once. NOTE: This medicine is only for you. Do not share this medicine with others. What if I miss a dose? It is important not to miss your dose. Call your doctor or health care professional if you are unable to keep an appointment. What may interact with this medicine? Interactions have not been studied. Give your health care provider a list of all the medicines, herbs, non-prescription drugs, or dietary supplements you use. Also tell them if you smoke, drink alcohol, or use illegal drugs. Some items may interact with your medicine. This list may not describe all possible interactions. Give your health care provider a list of all the medicines, herbs, non-prescription drugs, or dietary supplements you  use. Also tell them if you smoke, drink alcohol, or use illegal drugs. Some items may interact with your medicine. What should I watch for while using this medicine? Visit your doctor for checks on your progress. This drug may make you feel generally unwell. This is not uncommon, as chemotherapy can affect healthy cells as well as cancer cells. Report any side effects. Continue your course of treatment even though you feel ill unless your doctor tells you to stop. In some cases, you may be given additional medicines to help with side effects. Follow all directions for their use. Call your doctor or health care professional for advice if you get a fever, chills or sore throat, or other symptoms of a cold or flu. Do not treat yourself. This drug decreases your body's ability to fight infections. Try to avoid being around people who are sick. This medicine may increase your risk to bruise or bleed. Call your doctor or health care professional if you notice any unusual bleeding. You may need blood work done while you are taking this medicine. Do not become pregnant while taking this medicine and for 6 months after the last dose. Women should inform their doctor if they wish to become pregnant or think they might be pregnant. Men should not father a child while taking this medicine and for 3 months after the last dose. There is a potential for serious side effects to an unborn child. Talk to your health care professional or pharmacist for more information. Do not breast-feed an infant while taking this medicine and for 1 week after the last dose. This medicine may interfere with the ability to have a child.   Talk with your doctor or health care professional if you are concerned about your fertility. What side effects may I notice from receiving this medicine? Side effects that you should report to your doctor or health care professional as soon as possible: -allergic reactions like skin rash, itching or hives,  swelling of the face, lips, or tongue -low blood counts - this medicine may decrease the number of white blood cells, red blood cells and platelets. You may be at increased risk for infections and bleeding. -signs of infection - fever or chills, cough, sore throat, pain passing urine -signs of decreased platelets or bleeding - bruising, pinpoint red spots on the skin, black, tarry stools, blood in the urine -signs of decreased red blood cells - unusually weak or tired, fainting spells, lightheadedness -signs and symptoms of kidney injury like trouble passing urine or change in the amount of urine -signs and symptoms of liver injury like dark yellow or brown urine; general ill feeling or flu-like symptoms; light-colored stools; loss of appetite; nausea; right upper belly pain; unusually weak or tired; yellowing of the eyes or skin Side effects that usually do not require medical attention (report to your doctor or health care professional if they continue or are bothersome): -constipation -diarrhea -nausea, vomiting -pain or redness at the injection site -unusually weak or tired This list may not describe all possible side effects. Call your doctor for medical advice about side effects. You may report side effects to FDA at 1-800-FDA-1088. Where should I keep my medicine? This drug is given in a hospital or clinic and will not be stored at home. NOTE: This sheet is a summary. It may not cover all possible information. If you have questions about this medicine, talk to your doctor, pharmacist, or health care provider.  2018 Elsevier/Gold Standard (2016-09-15 14:37:51)  

## 2018-04-06 ENCOUNTER — Inpatient Hospital Stay: Payer: Medicare Other

## 2018-04-06 VITALS — BP 108/50 | HR 72 | Temp 95.0°F | Resp 18

## 2018-04-06 DIAGNOSIS — Z7189 Other specified counseling: Secondary | ICD-10-CM

## 2018-04-06 DIAGNOSIS — D469 Myelodysplastic syndrome, unspecified: Secondary | ICD-10-CM | POA: Diagnosis not present

## 2018-04-06 MED ORDER — ONDANSETRON HCL 4 MG PO TABS
8.0000 mg | ORAL_TABLET | Freq: Once | ORAL | Status: AC
  Administered 2018-04-06: 8 mg via ORAL
  Filled 2018-04-06: qty 2

## 2018-04-06 MED ORDER — AZACITIDINE CHEMO SQ INJECTION
75.0000 mg/m2 | Freq: Once | INTRAMUSCULAR | Status: AC
  Administered 2018-04-06: 155 mg via SUBCUTANEOUS
  Filled 2018-04-06 (×2): qty 6.2

## 2018-04-06 NOTE — Patient Instructions (Signed)
Azacitidine suspension for injection (subcutaneous use) What is this medicine? AZACITIDINE (ay za SITE i deen) is a chemotherapy drug. This medicine reduces the growth of cancer cells and can suppress the immune system. It is used for treating myelodysplastic syndrome or some types of leukemia. This medicine may be used for other purposes; ask your health care provider or pharmacist if you have questions. COMMON BRAND NAME(S): Vidaza What should I tell my health care provider before I take this medicine? They need to know if you have any of these conditions: -kidney disease -liver disease -liver tumors -an unusual or allergic reaction to azacitidine, mannitol, other medicines, foods, dyes, or preservatives -pregnant or trying to get pregnant -breast-feeding How should I use this medicine? This medicine is for injection under the skin. It is administered in a hospital or clinic by a specially trained health care professional. Talk to your pediatrician regarding the use of this medicine in children. While this drug may be prescribed for selected conditions, precautions do apply. Overdosage: If you think you have taken too much of this medicine contact a poison control center or emergency room at once. NOTE: This medicine is only for you. Do not share this medicine with others. What if I miss a dose? It is important not to miss your dose. Call your doctor or health care professional if you are unable to keep an appointment. What may interact with this medicine? Interactions have not been studied. Give your health care provider a list of all the medicines, herbs, non-prescription drugs, or dietary supplements you use. Also tell them if you smoke, drink alcohol, or use illegal drugs. Some items may interact with your medicine. This list may not describe all possible interactions. Give your health care provider a list of all the medicines, herbs, non-prescription drugs, or dietary supplements you  use. Also tell them if you smoke, drink alcohol, or use illegal drugs. Some items may interact with your medicine. What should I watch for while using this medicine? Visit your doctor for checks on your progress. This drug may make you feel generally unwell. This is not uncommon, as chemotherapy can affect healthy cells as well as cancer cells. Report any side effects. Continue your course of treatment even though you feel ill unless your doctor tells you to stop. In some cases, you may be given additional medicines to help with side effects. Follow all directions for their use. Call your doctor or health care professional for advice if you get a fever, chills or sore throat, or other symptoms of a cold or flu. Do not treat yourself. This drug decreases your body's ability to fight infections. Try to avoid being around people who are sick. This medicine may increase your risk to bruise or bleed. Call your doctor or health care professional if you notice any unusual bleeding. You may need blood work done while you are taking this medicine. Do not become pregnant while taking this medicine and for 6 months after the last dose. Women should inform their doctor if they wish to become pregnant or think they might be pregnant. Men should not father a child while taking this medicine and for 3 months after the last dose. There is a potential for serious side effects to an unborn child. Talk to your health care professional or pharmacist for more information. Do not breast-feed an infant while taking this medicine and for 1 week after the last dose. This medicine may interfere with the ability to have a child.   Talk with your doctor or health care professional if you are concerned about your fertility. What side effects may I notice from receiving this medicine? Side effects that you should report to your doctor or health care professional as soon as possible: -allergic reactions like skin rash, itching or hives,  swelling of the face, lips, or tongue -low blood counts - this medicine may decrease the number of white blood cells, red blood cells and platelets. You may be at increased risk for infections and bleeding. -signs of infection - fever or chills, cough, sore throat, pain passing urine -signs of decreased platelets or bleeding - bruising, pinpoint red spots on the skin, black, tarry stools, blood in the urine -signs of decreased red blood cells - unusually weak or tired, fainting spells, lightheadedness -signs and symptoms of kidney injury like trouble passing urine or change in the amount of urine -signs and symptoms of liver injury like dark yellow or brown urine; general ill feeling or flu-like symptoms; light-colored stools; loss of appetite; nausea; right upper belly pain; unusually weak or tired; yellowing of the eyes or skin Side effects that usually do not require medical attention (report to your doctor or health care professional if they continue or are bothersome): -constipation -diarrhea -nausea, vomiting -pain or redness at the injection site -unusually weak or tired This list may not describe all possible side effects. Call your doctor for medical advice about side effects. You may report side effects to FDA at 1-800-FDA-1088. Where should I keep my medicine? This drug is given in a hospital or clinic and will not be stored at home. NOTE: This sheet is a summary. It may not cover all possible information. If you have questions about this medicine, talk to your doctor, pharmacist, or health care provider.  2018 Elsevier/Gold Standard (2016-09-15 14:37:51)  

## 2018-04-07 ENCOUNTER — Inpatient Hospital Stay: Payer: Medicare Other

## 2018-04-07 ENCOUNTER — Other Ambulatory Visit: Payer: Self-pay | Admitting: *Deleted

## 2018-04-07 ENCOUNTER — Other Ambulatory Visit: Payer: Self-pay | Admitting: Oncology

## 2018-04-07 VITALS — BP 123/70 | HR 74 | Temp 94.3°F | Resp 18

## 2018-04-07 DIAGNOSIS — D630 Anemia in neoplastic disease: Secondary | ICD-10-CM

## 2018-04-07 DIAGNOSIS — D649 Anemia, unspecified: Secondary | ICD-10-CM

## 2018-04-07 DIAGNOSIS — D469 Myelodysplastic syndrome, unspecified: Secondary | ICD-10-CM

## 2018-04-07 DIAGNOSIS — D6481 Anemia due to antineoplastic chemotherapy: Secondary | ICD-10-CM

## 2018-04-07 DIAGNOSIS — T451X5A Adverse effect of antineoplastic and immunosuppressive drugs, initial encounter: Secondary | ICD-10-CM

## 2018-04-07 DIAGNOSIS — Z7189 Other specified counseling: Secondary | ICD-10-CM

## 2018-04-07 LAB — CBC WITH DIFFERENTIAL/PLATELET
Basophils Absolute: 0 10*3/uL (ref 0–0.1)
Basophils Relative: 0 %
EOS ABS: 0 10*3/uL (ref 0–0.7)
Eosinophils Relative: 0 %
HCT: 23.8 % — ABNORMAL LOW (ref 40.0–52.0)
Hemoglobin: 6.9 g/dL — ABNORMAL LOW (ref 13.0–18.0)
LYMPHS ABS: 1.5 10*3/uL (ref 1.0–3.6)
LYMPHS PCT: 39 %
MCH: 25.9 pg — AB (ref 26.0–34.0)
MCHC: 29 g/dL — AB (ref 32.0–36.0)
MCV: 89.4 fL (ref 80.0–100.0)
MONO ABS: 0.7 10*3/uL (ref 0.2–1.0)
Monocytes Relative: 17 %
Neutro Abs: 1.8 10*3/uL (ref 1.4–6.5)
Neutrophils Relative %: 44 %
Platelets: 295 10*3/uL (ref 150–440)
RBC: 2.67 MIL/uL — ABNORMAL LOW (ref 4.40–5.90)
RDW: 21.6 % — AB (ref 11.5–14.5)
WBC: 4 10*3/uL (ref 3.8–10.6)

## 2018-04-07 LAB — PREPARE RBC (CROSSMATCH)

## 2018-04-07 MED ORDER — AZACITIDINE CHEMO SQ INJECTION
75.0000 mg/m2 | Freq: Once | INTRAMUSCULAR | Status: AC
  Administered 2018-04-07: 155 mg via SUBCUTANEOUS
  Filled 2018-04-07: qty 6.2

## 2018-04-07 MED ORDER — ONDANSETRON HCL 4 MG PO TABS
8.0000 mg | ORAL_TABLET | Freq: Once | ORAL | Status: AC
  Administered 2018-04-07: 8 mg via ORAL
  Filled 2018-04-07: qty 2

## 2018-04-07 NOTE — Patient Instructions (Signed)
Azacitidine suspension for injection (subcutaneous use) What is this medicine? AZACITIDINE (ay za SITE i deen) is a chemotherapy drug. This medicine reduces the growth of cancer cells and can suppress the immune system. It is used for treating myelodysplastic syndrome or some types of leukemia. This medicine may be used for other purposes; ask your health care provider or pharmacist if you have questions. COMMON BRAND NAME(S): Vidaza What should I tell my health care provider before I take this medicine? They need to know if you have any of these conditions: -kidney disease -liver disease -liver tumors -an unusual or allergic reaction to azacitidine, mannitol, other medicines, foods, dyes, or preservatives -pregnant or trying to get pregnant -breast-feeding How should I use this medicine? This medicine is for injection under the skin. It is administered in a hospital or clinic by a specially trained health care professional. Talk to your pediatrician regarding the use of this medicine in children. While this drug may be prescribed for selected conditions, precautions do apply. Overdosage: If you think you have taken too much of this medicine contact a poison control center or emergency room at once. NOTE: This medicine is only for you. Do not share this medicine with others. What if I miss a dose? It is important not to miss your dose. Call your doctor or health care professional if you are unable to keep an appointment. What may interact with this medicine? Interactions have not been studied. Give your health care provider a list of all the medicines, herbs, non-prescription drugs, or dietary supplements you use. Also tell them if you smoke, drink alcohol, or use illegal drugs. Some items may interact with your medicine. This list may not describe all possible interactions. Give your health care provider a list of all the medicines, herbs, non-prescription drugs, or dietary supplements you  use. Also tell them if you smoke, drink alcohol, or use illegal drugs. Some items may interact with your medicine. What should I watch for while using this medicine? Visit your doctor for checks on your progress. This drug may make you feel generally unwell. This is not uncommon, as chemotherapy can affect healthy cells as well as cancer cells. Report any side effects. Continue your course of treatment even though you feel ill unless your doctor tells you to stop. In some cases, you may be given additional medicines to help with side effects. Follow all directions for their use. Call your doctor or health care professional for advice if you get a fever, chills or sore throat, or other symptoms of a cold or flu. Do not treat yourself. This drug decreases your body's ability to fight infections. Try to avoid being around people who are sick. This medicine may increase your risk to bruise or bleed. Call your doctor or health care professional if you notice any unusual bleeding. You may need blood work done while you are taking this medicine. Do not become pregnant while taking this medicine and for 6 months after the last dose. Women should inform their doctor if they wish to become pregnant or think they might be pregnant. Men should not father a child while taking this medicine and for 3 months after the last dose. There is a potential for serious side effects to an unborn child. Talk to your health care professional or pharmacist for more information. Do not breast-feed an infant while taking this medicine and for 1 week after the last dose. This medicine may interfere with the ability to have a child.   Talk with your doctor or health care professional if you are concerned about your fertility. What side effects may I notice from receiving this medicine? Side effects that you should report to your doctor or health care professional as soon as possible: -allergic reactions like skin rash, itching or hives,  swelling of the face, lips, or tongue -low blood counts - this medicine may decrease the number of white blood cells, red blood cells and platelets. You may be at increased risk for infections and bleeding. -signs of infection - fever or chills, cough, sore throat, pain passing urine -signs of decreased platelets or bleeding - bruising, pinpoint red spots on the skin, black, tarry stools, blood in the urine -signs of decreased red blood cells - unusually weak or tired, fainting spells, lightheadedness -signs and symptoms of kidney injury like trouble passing urine or change in the amount of urine -signs and symptoms of liver injury like dark yellow or brown urine; general ill feeling or flu-like symptoms; light-colored stools; loss of appetite; nausea; right upper belly pain; unusually weak or tired; yellowing of the eyes or skin Side effects that usually do not require medical attention (report to your doctor or health care professional if they continue or are bothersome): -constipation -diarrhea -nausea, vomiting -pain or redness at the injection site -unusually weak or tired This list may not describe all possible side effects. Call your doctor for medical advice about side effects. You may report side effects to FDA at 1-800-FDA-1088. Where should I keep my medicine? This drug is given in a hospital or clinic and will not be stored at home. NOTE: This sheet is a summary. It may not cover all possible information. If you have questions about this medicine, talk to your doctor, pharmacist, or health care provider.  2018 Elsevier/Gold Standard (2016-09-15 14:37:51)  

## 2018-04-08 ENCOUNTER — Inpatient Hospital Stay: Payer: Medicare Other

## 2018-04-08 VITALS — BP 106/31 | HR 68 | Temp 97.0°F | Resp 16

## 2018-04-08 DIAGNOSIS — D649 Anemia, unspecified: Secondary | ICD-10-CM

## 2018-04-08 DIAGNOSIS — D469 Myelodysplastic syndrome, unspecified: Secondary | ICD-10-CM | POA: Diagnosis not present

## 2018-04-08 DIAGNOSIS — Z7189 Other specified counseling: Secondary | ICD-10-CM

## 2018-04-08 MED ORDER — ONDANSETRON HCL 4 MG PO TABS
8.0000 mg | ORAL_TABLET | Freq: Once | ORAL | Status: AC
  Administered 2018-04-08: 8 mg via ORAL
  Filled 2018-04-08: qty 2

## 2018-04-08 MED ORDER — SODIUM CHLORIDE 0.9% IV SOLUTION
250.0000 mL | Freq: Once | INTRAVENOUS | Status: AC
  Administered 2018-04-08: 250 mL via INTRAVENOUS
  Filled 2018-04-08: qty 250

## 2018-04-08 MED ORDER — DIPHENHYDRAMINE HCL 25 MG PO CAPS
25.0000 mg | ORAL_CAPSULE | Freq: Once | ORAL | Status: AC
  Administered 2018-04-08: 25 mg via ORAL
  Filled 2018-04-08: qty 1

## 2018-04-08 MED ORDER — ACETAMINOPHEN 325 MG PO TABS
650.0000 mg | ORAL_TABLET | Freq: Once | ORAL | Status: AC
  Administered 2018-04-08: 650 mg via ORAL
  Filled 2018-04-08: qty 2

## 2018-04-08 MED ORDER — AZACITIDINE CHEMO SQ INJECTION
75.0000 mg/m2 | Freq: Once | INTRAMUSCULAR | Status: AC
  Administered 2018-04-08: 155 mg via SUBCUTANEOUS
  Filled 2018-04-08: qty 6.2

## 2018-04-08 NOTE — Patient Instructions (Signed)
Azacitidine suspension for injection (subcutaneous use) What is this medicine? AZACITIDINE (ay za SITE i deen) is a chemotherapy drug. This medicine reduces the growth of cancer cells and can suppress the immune system. It is used for treating myelodysplastic syndrome or some types of leukemia. This medicine may be used for other purposes; ask your health care provider or pharmacist if you have questions. COMMON BRAND NAME(S): Vidaza What should I tell my health care provider before I take this medicine? They need to know if you have any of these conditions: -kidney disease -liver disease -liver tumors -an unusual or allergic reaction to azacitidine, mannitol, other medicines, foods, dyes, or preservatives -pregnant or trying to get pregnant -breast-feeding How should I use this medicine? This medicine is for injection under the skin. It is administered in a hospital or clinic by a specially trained health care professional. Talk to your pediatrician regarding the use of this medicine in children. While this drug may be prescribed for selected conditions, precautions do apply. Overdosage: If you think you have taken too much of this medicine contact a poison control center or emergency room at once. NOTE: This medicine is only for you. Do not share this medicine with others. What if I miss a dose? It is important not to miss your dose. Call your doctor or health care professional if you are unable to keep an appointment. What may interact with this medicine? Interactions have not been studied. Give your health care provider a list of all the medicines, herbs, non-prescription drugs, or dietary supplements you use. Also tell them if you smoke, drink alcohol, or use illegal drugs. Some items may interact with your medicine. This list may not describe all possible interactions. Give your health care provider a list of all the medicines, herbs, non-prescription drugs, or dietary supplements you  use. Also tell them if you smoke, drink alcohol, or use illegal drugs. Some items may interact with your medicine. What should I watch for while using this medicine? Visit your doctor for checks on your progress. This drug may make you feel generally unwell. This is not uncommon, as chemotherapy can affect healthy cells as well as cancer cells. Report any side effects. Continue your course of treatment even though you feel ill unless your doctor tells you to stop. In some cases, you may be given additional medicines to help with side effects. Follow all directions for their use. Call your doctor or health care professional for advice if you get a fever, chills or sore throat, or other symptoms of a cold or flu. Do not treat yourself. This drug decreases your body's ability to fight infections. Try to avoid being around people who are sick. This medicine may increase your risk to bruise or bleed. Call your doctor or health care professional if you notice any unusual bleeding. You may need blood work done while you are taking this medicine. Do not become pregnant while taking this medicine and for 6 months after the last dose. Women should inform their doctor if they wish to become pregnant or think they might be pregnant. Men should not father a child while taking this medicine and for 3 months after the last dose. There is a potential for serious side effects to an unborn child. Talk to your health care professional or pharmacist for more information. Do not breast-feed an infant while taking this medicine and for 1 week after the last dose. This medicine may interfere with the ability to have a child.   Talk with your doctor or health care professional if you are concerned about your fertility. What side effects may I notice from receiving this medicine? Side effects that you should report to your doctor or health care professional as soon as possible: -allergic reactions like skin rash, itching or hives,  swelling of the face, lips, or tongue -low blood counts - this medicine may decrease the number of white blood cells, red blood cells and platelets. You may be at increased risk for infections and bleeding. -signs of infection - fever or chills, cough, sore throat, pain passing urine -signs of decreased platelets or bleeding - bruising, pinpoint red spots on the skin, black, tarry stools, blood in the urine -signs of decreased red blood cells - unusually weak or tired, fainting spells, lightheadedness -signs and symptoms of kidney injury like trouble passing urine or change in the amount of urine -signs and symptoms of liver injury like dark yellow or brown urine; general ill feeling or flu-like symptoms; light-colored stools; loss of appetite; nausea; right upper belly pain; unusually weak or tired; yellowing of the eyes or skin Side effects that usually do not require medical attention (report to your doctor or health care professional if they continue or are bothersome): -constipation -diarrhea -nausea, vomiting -pain or redness at the injection site -unusually weak or tired This list may not describe all possible side effects. Call your doctor for medical advice about side effects. You may report side effects to FDA at 1-800-FDA-1088. Where should I keep my medicine? This drug is given in a hospital or clinic and will not be stored at home. NOTE: This sheet is a summary. It may not cover all possible information. If you have questions about this medicine, talk to your doctor, pharmacist, or health care provider.  2018 Elsevier/Gold Standard (2016-09-15 14:37:51)  

## 2018-04-09 LAB — BPAM RBC
BLOOD PRODUCT EXPIRATION DATE: 201908182359
ISSUE DATE / TIME: 201908091042
Unit Type and Rh: 5100

## 2018-04-09 LAB — TYPE AND SCREEN
ABO/RH(D): B POS
ANTIBODY SCREEN: NEGATIVE
UNIT DIVISION: 0

## 2018-04-14 ENCOUNTER — Inpatient Hospital Stay: Payer: Medicare Other

## 2018-04-14 VITALS — BP 116/37 | HR 75 | Temp 97.2°F | Resp 17

## 2018-04-14 DIAGNOSIS — D6481 Anemia due to antineoplastic chemotherapy: Secondary | ICD-10-CM

## 2018-04-14 DIAGNOSIS — T451X5A Adverse effect of antineoplastic and immunosuppressive drugs, initial encounter: Secondary | ICD-10-CM

## 2018-04-14 DIAGNOSIS — D469 Myelodysplastic syndrome, unspecified: Secondary | ICD-10-CM

## 2018-04-14 DIAGNOSIS — D649 Anemia, unspecified: Secondary | ICD-10-CM

## 2018-04-14 DIAGNOSIS — D630 Anemia in neoplastic disease: Secondary | ICD-10-CM

## 2018-04-14 LAB — CBC WITH DIFFERENTIAL/PLATELET
BASOS PCT: 0 %
Basophils Absolute: 0 10*3/uL (ref 0–0.1)
Eosinophils Absolute: 0 10*3/uL (ref 0–0.7)
Eosinophils Relative: 1 %
HEMATOCRIT: 23.7 % — AB (ref 40.0–52.0)
Hemoglobin: 7.1 g/dL — ABNORMAL LOW (ref 13.0–18.0)
LYMPHS PCT: 51 %
Lymphs Abs: 2 10*3/uL (ref 1.0–3.6)
MCH: 26.6 pg (ref 26.0–34.0)
MCHC: 29.9 g/dL — ABNORMAL LOW (ref 32.0–36.0)
MCV: 89 fL (ref 80.0–100.0)
MONO ABS: 0.4 10*3/uL (ref 0.2–1.0)
Monocytes Relative: 11 %
NEUTROS ABS: 1.5 10*3/uL (ref 1.4–6.5)
Neutrophils Relative %: 37 %
Platelets: 296 10*3/uL (ref 150–440)
RBC: 2.67 MIL/uL — ABNORMAL LOW (ref 4.40–5.90)
RDW: 19.7 % — AB (ref 11.5–14.5)
WBC: 3.9 10*3/uL (ref 3.8–10.6)

## 2018-04-14 LAB — TYPE AND SCREEN
ABO/RH(D): B POS
ANTIBODY SCREEN: NEGATIVE

## 2018-04-14 MED ORDER — DARBEPOETIN ALFA 150 MCG/0.3ML IJ SOSY
300.0000 ug | PREFILLED_SYRINGE | INTRAMUSCULAR | Status: DC
  Administered 2018-04-14: 300 ug via SUBCUTANEOUS

## 2018-04-21 ENCOUNTER — Other Ambulatory Visit: Payer: Self-pay | Admitting: *Deleted

## 2018-04-21 ENCOUNTER — Inpatient Hospital Stay: Payer: Medicare Other

## 2018-04-21 VITALS — BP 130/47 | HR 83 | Temp 95.6°F | Resp 18

## 2018-04-21 DIAGNOSIS — D630 Anemia in neoplastic disease: Secondary | ICD-10-CM

## 2018-04-21 DIAGNOSIS — D469 Myelodysplastic syndrome, unspecified: Secondary | ICD-10-CM

## 2018-04-21 DIAGNOSIS — D649 Anemia, unspecified: Secondary | ICD-10-CM

## 2018-04-21 LAB — COMPREHENSIVE METABOLIC PANEL
ALBUMIN: 4.2 g/dL (ref 3.5–5.0)
ALT: 15 U/L (ref 0–44)
ANION GAP: 9 (ref 5–15)
AST: 25 U/L (ref 15–41)
Alkaline Phosphatase: 26 U/L — ABNORMAL LOW (ref 38–126)
BUN: 19 mg/dL (ref 8–23)
CHLORIDE: 102 mmol/L (ref 98–111)
CO2: 22 mmol/L (ref 22–32)
Calcium: 8.7 mg/dL — ABNORMAL LOW (ref 8.9–10.3)
Creatinine, Ser: 0.68 mg/dL (ref 0.61–1.24)
GFR calc Af Amer: >60 mL/min (ref >60)
GFR calc non Af Amer: >60 mL/min (ref >60)
GLUCOSE: 199 mg/dL — AB (ref 70–99)
POTASSIUM: 4.2 mmol/L (ref 3.5–5.1)
SODIUM: 133 mmol/L — AB (ref 135–145)
TOTAL PROTEIN: 6.8 g/dL (ref 6.5–8.1)
Total Bilirubin: 0.8 mg/dL (ref 0.3–1.2)

## 2018-04-21 LAB — CBC WITH DIFFERENTIAL/PLATELET
BASOS PCT: 0 %
Basophils Absolute: 0 10*3/uL (ref 0–0.1)
EOS PCT: 1 %
Eosinophils Absolute: 0 10*3/uL (ref 0–0.7)
HCT: 22.9 % — ABNORMAL LOW (ref 40.0–52.0)
HEMOGLOBIN: 6.8 g/dL — AB (ref 13.0–18.0)
Lymphocytes Relative: 37 %
Lymphs Abs: 1.4 10*3/uL (ref 1.0–3.6)
MCH: 26.9 pg (ref 26.0–34.0)
MCHC: 29.7 g/dL — AB (ref 32.0–36.0)
MCV: 90.4 fL (ref 80.0–100.0)
Monocytes Absolute: 0.7 10*3/uL (ref 0.2–1.0)
Monocytes Relative: 18 %
NEUTROS PCT: 44 %
Neutro Abs: 1.6 10*3/uL (ref 1.4–6.5)
PLATELETS: 153 10*3/uL (ref 150–440)
RBC: 2.53 MIL/uL — AB (ref 4.40–5.90)
RDW: 22 % — ABNORMAL HIGH (ref 11.5–14.5)
WBC: 3.7 10*3/uL — AB (ref 3.8–10.6)

## 2018-04-21 MED ORDER — DARBEPOETIN ALFA 150 MCG/0.3ML IJ SOSY
300.0000 ug | PREFILLED_SYRINGE | INTRAMUSCULAR | Status: DC
  Administered 2018-04-21: 300 ug via SUBCUTANEOUS

## 2018-04-21 NOTE — Patient Instructions (Signed)

## 2018-04-28 ENCOUNTER — Other Ambulatory Visit: Payer: Self-pay

## 2018-04-28 ENCOUNTER — Other Ambulatory Visit: Payer: TRICARE For Life (TFL)

## 2018-04-28 ENCOUNTER — Inpatient Hospital Stay (HOSPITAL_BASED_OUTPATIENT_CLINIC_OR_DEPARTMENT_OTHER): Payer: Medicare Other | Admitting: Oncology

## 2018-04-28 ENCOUNTER — Other Ambulatory Visit: Payer: Self-pay | Admitting: *Deleted

## 2018-04-28 ENCOUNTER — Encounter: Payer: Self-pay | Admitting: Oncology

## 2018-04-28 ENCOUNTER — Inpatient Hospital Stay: Payer: Medicare Other

## 2018-04-28 ENCOUNTER — Ambulatory Visit: Payer: TRICARE For Life (TFL)

## 2018-04-28 ENCOUNTER — Other Ambulatory Visit: Payer: Self-pay | Admitting: Oncology

## 2018-04-28 VITALS — BP 120/56 | HR 78 | Temp 97.6°F | Resp 18 | Wt 178.1 lb

## 2018-04-28 DIAGNOSIS — D649 Anemia, unspecified: Secondary | ICD-10-CM

## 2018-04-28 DIAGNOSIS — D630 Anemia in neoplastic disease: Secondary | ICD-10-CM

## 2018-04-28 DIAGNOSIS — D469 Myelodysplastic syndrome, unspecified: Secondary | ICD-10-CM | POA: Diagnosis not present

## 2018-04-28 DIAGNOSIS — Z79899 Other long term (current) drug therapy: Secondary | ICD-10-CM | POA: Diagnosis not present

## 2018-04-28 DIAGNOSIS — Z5111 Encounter for antineoplastic chemotherapy: Secondary | ICD-10-CM

## 2018-04-28 LAB — CBC WITH DIFFERENTIAL/PLATELET
BASOS PCT: 0 %
Basophils Absolute: 0 10*3/uL (ref 0–0.1)
EOS PCT: 1 %
Eosinophils Absolute: 0 10*3/uL (ref 0–0.7)
HEMATOCRIT: 24.1 % — AB (ref 40.0–52.0)
Hemoglobin: 7.1 g/dL — ABNORMAL LOW (ref 13.0–18.0)
Lymphocytes Relative: 35 %
Lymphs Abs: 1.2 10*3/uL (ref 1.0–3.6)
MCH: 26.7 pg (ref 26.0–34.0)
MCHC: 29.3 g/dL — ABNORMAL LOW (ref 32.0–36.0)
MCV: 91 fL (ref 80.0–100.0)
Monocytes Absolute: 0.3 10*3/uL (ref 0.2–1.0)
Monocytes Relative: 10 %
NRBC: 2 /100{WBCs} — AB
Neutro Abs: 1.9 10*3/uL (ref 1.4–6.5)
Neutrophils Relative %: 54 %
Platelets: 260 10*3/uL (ref 150–440)
RBC: 2.65 MIL/uL — ABNORMAL LOW (ref 4.40–5.90)
RDW: 21.4 % — ABNORMAL HIGH (ref 11.5–14.5)
WBC: 3.4 10*3/uL — ABNORMAL LOW (ref 3.8–10.6)

## 2018-04-28 LAB — SAMPLE TO BLOOD BANK

## 2018-04-28 LAB — PREPARE RBC (CROSSMATCH)

## 2018-04-28 MED ORDER — DARBEPOETIN ALFA 150 MCG/0.3ML IJ SOSY
300.0000 ug | PREFILLED_SYRINGE | INTRAMUSCULAR | Status: DC
  Administered 2018-04-28: 300 ug via SUBCUTANEOUS

## 2018-04-28 NOTE — Progress Notes (Signed)
Patient here for follow up. No concerns voiced.  °

## 2018-04-28 NOTE — Progress Notes (Signed)
Hematology/Oncology Follow Up Note [facility]  Telephone:([phone] [phone]  Patient Care Team: Tracie Harrier, MD as PCP - General (Internal Medicine)   Name of the patient: Paul Pham  [id number]  1926/01/04   REASON FOR VISIT  follow-up for management of anemia. HISTORY OF PRESENT ILLNESS Paul Pham. is a  84 y.o.  Pham with PMH listed below who was present as a follow-up after hospital admission for management of anemia. Patient was seen during his most recent admission on 08/18/2017. Patient is heart hearing. He has been feeling increased fatigue, dyspnea, and hemoglobin has dropped from baseline 10 to 7 within the past couple of months. Last colonoscopy was done 10 years ago. Outpatient stool occult test is negative. Patient also takes iron pills and stool is always black.  # He has had extensive workup including urinalysis negative for hematuria, normal A21 and folic level. Normal TSH, negative hemolysis workup. Her stool occult  is being negative as well. 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 Patient and his family members understand that this condition is not curable.   Treatment:  Azacitidine 75 mg/m2  Day 1-5 of every 28 days cycle.  Cycle 1 3/7-3/8, 3/11-13 cycle 2  12/06/17- 12/10/17, day 1-5 Cycle 3 5/6 -5/10. Cycle 4 6/3-02/04/18 Cycle 5 7/8- 03/11/2018 Cycle 6 8/5- 8/9   Aranesp 122mg weekly  INTERVAL HISTORY Paul Pham is a 84y.o. Pham who has above history reviewed by me today presents for follow-up visit for management of MDS. Status post 6cycles of azacitidine.  Reports doing well at baseline.  Feels shortness of breath with exertion.  He is still able to do some housework. Continue to have fatigue which is  chronic at baseline.  He feels tired if he due to much of housework.  Appetite is good.  Denies any chest pain, shortness of breath abdominal pain and nausea vomiting.  Review of systems Review of Systems  Constitutional: Positive for malaise/fatigue. Negative for chills, diaphoresis, fever and weight loss.  HENT: Negative for congestion, ear discharge, ear pain, hearing loss, nosebleeds, sinus pain, sore throat and tinnitus.   Eyes: Negative for blurred vision, double vision, photophobia, pain, discharge and redness.  Respiratory: Negative for cough, hemoptysis, sputum production, shortness of breath and wheezing.        Shortness of breath with exertion  Cardiovascular: Negative for chest pain, palpitations, orthopnea, claudication and leg swelling.  Gastrointestinal: Negative for abdominal pain, blood in stool, constipation, diarrhea, heartburn, melena, nausea and vomiting.  Genitourinary: Negative for dysuria, flank pain, frequency, hematuria and urgency.  Musculoskeletal: Negative for back pain, myalgias and neck pain.  Skin: Negative for itching and rash.  Neurological: Negative for dizziness, tingling, tremors, sensory change, speech change, focal weakness, weakness and headaches.  Endo/Heme/Allergies: Negative for environmental allergies. Does not bruise/bleed easily.  Psychiatric/Behavioral: Negative for depression, hallucinations, substance abuse and suicidal ideas. The patient is not nervous/anxious.     No Known Allergies   Past Medical History:  Diagnosis Date  . Anemia   . Arthritis   . BPH (benign prostatic hypertrophy)   . Complication of anesthesia    nausea  . [place] 2[place]   . HOH (hard of hearing)    r and L ears-70% loss per pt  .  Hyperlipidemia   . Hypertension      Past Surgical History:  Procedure Laterality Date  . APPENDECTOMY    . COLONOSCOPY  09/21/08   1 polyp found, tubular adenoma  . HAND SURGERY    . KNEE SURGERY Left      Social History   Socioeconomic History  . Marital status: Married    Spouse name: Not on file  . Number of children: Not on file  . Years of education: Not on file  . Highest education level: Not on file  Occupational History  . Not on file  Social Needs  . Financial resource strain: Not on file  . Food insecurity:    Worry: Not on file    Inability: Not on file  . Transportation needs:    Medical: Not on file    Non-medical: Not on file  Tobacco Use  . Smoking status: Never Smoker  . Smokeless tobacco: Never Used  Substance and Sexual Activity  . Alcohol use: No    Alcohol/week: 0.0 standard drinks  . Drug use: No  . Sexual activity: Not on file  Lifestyle  . Physical activity:    Days per week: Not on file    Minutes per session: Not on file  . Stress: Not on file  Relationships  . Social connections:    Talks on phone: Not on file    Gets together: Not on file    Attends religious service: Not on file    Active member of club or organization: Not on file    Attends meetings of clubs or organizations: Not on file    Relationship status: Not on file  . Intimate partner violence:    Fear of current or ex partner: Not on file    Emotionally abused: Not on file    Physically abused: Not on file    Forced sexual activity: Not on file  Other Topics Concern  . Not on file  Social History Narrative  . Not on file    Family History  Problem Relation Age of Onset  . Aneurysm Mother   . Heart disease Father   . Cancer Brother        skin  . Heart disease Brother   . Heart disease Brother   . Cancer Sister        skin  . Aneurysm Brother   . Heart disease Brother   . Heart disease Sister   . Heart disease Sister   . COPD Sister   . Diabetes Sister      Current Outpatient Medications:  .  aspirin EC 81 MG tablet, Take 81 mg by mouth daily., Disp: , Rfl:  .  Cranberry (THERACRAN PO), Take by mouth., Disp: , Rfl:  .  cyanocobalamin 1000 MCG tablet,  Take 1,000 mcg by mouth daily., Disp: , Rfl:  .  finasteride (PROSCAR) 5 MG tablet, Take 5 mg by mouth daily., Disp: , Rfl:  .  glucose blood (PRECISION QID TEST) test strip, Use 3 (three) times daily. Use as instructed., Disp: , Rfl:  .  lisinopril (PRINIVIL,ZESTRIL) 10 MG tablet, Take 10 mg by mouth daily., Disp: , Rfl:  .  loperamide (IMODIUM) 2 MG capsule, Take 1 capsule (2 mg total) by mouth See admin instructions. With onset of loose stool, take 69m followed by 249mevery 2 hours until loose bowel movement stopped. Maximum: 16 mg/day, Disp: 30 capsule, Rfl: 0 .  metformin (FORTAMET) 1000 MG (OSM) 24 hr tablet,  Take 1,000 mg by mouth 2 (two) times daily with a meal., Disp: , Rfl:  .  Multiple Vitamins-Minerals (CENTRUM SILVER PO), Take by mouth., Disp: , Rfl:  .  Omega-3 Fatty Acids (FISH OIL PO), Take by mouth., Disp: , Rfl:  .  ondansetron (ZOFRAN) 4 MG tablet, Take 1 tablet (4 mg total) by mouth every 6 (six) hours as needed for nausea or vomiting., Disp: 30 tablet, Rfl: 0 .  Polyethylene Glycol 3350 (MIRALAX PO), Take by mouth., Disp: , Rfl:  .  Saw Palmetto-Phytosterols (PROSTATE SR PO), Take by mouth., Disp: , Rfl:  .  simvastatin (ZOCOR) 40 MG tablet, Take 40 mg by mouth daily., Disp: , Rfl:  No current facility-administered medications for this visit.   Facility-Administered Medications Ordered in Other Visits:  .  Darbepoetin Alfa (ARANESP) injection 300 mcg, 300 mcg, Subcutaneous, Q7 days, Earlie Server, MD  Physical exam:  Vitals:   04/28/18 0932  BP: (!) 120/56  Pulse: 78  Resp: 18  Temp: 97.6 F (36.4 C)  TempSrc: Tympanic  Weight: 178 lb 2.1 oz (80.8 kg)    Physical Exam  Constitutional: He is oriented to person, place, and time. No distress.  HENT:  Head: Normocephalic and atraumatic.  Nose: Nose normal.  Mouth/Throat: Oropharynx is clear and moist. No oropharyngeal exudate.  Eyes: Pupils are equal, round, and reactive to light. EOM are normal. Right eye exhibits no  discharge. Left eye exhibits no discharge. No scleral icterus.  Pale conjunctivae   Neck: Normal range of motion. Neck supple. No JVD present.  Cardiovascular: Normal rate, regular rhythm and normal heart sounds.  No murmur heard. Pulmonary/Chest: Effort normal and breath sounds normal. No respiratory distress. He has no wheezes. He has no rales. He exhibits no tenderness.  Abdominal: Soft. Bowel sounds are normal. He exhibits no distension and no mass. There is no tenderness. There is no rebound.  Musculoskeletal: Normal range of motion. He exhibits no edema, tenderness or deformity.  Lymphadenopathy:    He has no cervical adenopathy.  Neurological: He is alert and oriented to person, place, and time. No cranial nerve deficit. He exhibits normal muscle tone. Coordination normal.  Skin: Skin is warm and dry. No rash noted. He is not diaphoretic. No erythema. There is pallor.  Psychiatric: Affect and judgment normal.    CMP Latest Ref Rng & Units 04/21/2018  Glucose 70 - 99 mg/dL 199(H)  BUN 8 - 23 mg/dL 19  Creatinine 0.61 - 1.24 mg/dL 0.68  Sodium 135 - 145 mmol/L 133(L)  Potassium 3.5 - 5.1 mmol/L 4.2  Chloride 98 - 111 mmol/L 102  CO2 22 - 32 mmol/L 22  Calcium 8.9 - 10.3 mg/dL 8.7(L)  Total Protein 6.5 - 8.1 g/dL 6.8  Total Bilirubin 0.3 - 1.2 mg/dL 0.8  Alkaline Phos 38 - 126 U/L 26(L)  AST 15 - 41 U/L 25  ALT 0 - 44 U/L 15   CBC Latest Ref Rng & Units 04/28/2018  WBC 3.8 - 10.6 K/uL 3.4(L)  Hemoglobin 13.0 - 18.0 g/dL 7.1(L)  Hematocrit 40.0 - 52.0 % 24.1(L)  Platelets 150 - 440 K/uL 260   Patient's workup including stool occult was negative. UA negative for hematuria. Normal I33 and folic levels. normal TSH. Negative hemolysis workup Inappropriate normal reticulocyte count.   09/22/2017  Bone marrow biopsy Diagnosis Bone Marrow, Aspirate,Biopsy, and Clot, right iliac - HYPERCELLULAR BONE MARROW FOR AGE WITH DYSPOIETIC CHANGES.- SEE COMMENT. PERIPHERAL BLOOD: -  NORMOCYTIC-HYPOCHROMIC ANEMIA. - LEUKOPENIA. Diagnosis  Note The bone marrow is hypercellular for age with dyspoietic changes variably involving myeloid cell lines, but with main involvement of the granulocytic/monocytic cell line. This is associated with bone marrow monocytosis and borderline number to slight increase in blastic cells. The latter is mainly seen by immunohistochemistry. The overall changes favor a primary myeloid neoplasm, particularly a myelodysplastic syndrome especially refractory anemia with excess blasts (RAEB-1) or possibly refractory cytopenia with multilineage dysplasia. Consideration was also given to an evolving myelodysplastic/myeloproliferative neoplasm such as chronic myelomonocytic leukemia but the lack of absolute peripheral monocytosis precludes such a diagnosis at this time. Correlation with cytogenetic studies is recommended. (BNS:ah:ecj 09/24/17) The blastic cells represent 5% of all cells Bone Marrow Flow Cytometry - NO SIGNIFICANT CD34 [place] one testing positive for  ASXL1 [id number]*86 EZH2 Splice site 754-4B>E RUNX1 A838s*56 IPSS -R 4.5, intermediate group. 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 With median survival 1.6 years.   Assessment and plan Patient is a 84 y.o. 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 1. MDS (myelodysplastic syndrome) ([place])   2. Symptomatic anemia   3. Anemia in neoplastic disease   4. Encounter for antineoplastic chemotherapy    # MDS: Tolerates azacitidine 66m/m2 Day 1-5 every 28 days. Labs reviewed, counts acceptable for proceeding next cycle of azacitidine.  9/2/ 2019 is a holiday and cancer center is not open.  Therefore patient will start azacitidine on 9/3 2019, his last treatment will be 05/09/2018.  # Anemia due to MDS/chemotherapy.  Symptomatic.  Feels tired. Proceed with  Aranesp today.  Hemoglobin 7.1 today. Given his advance age and symptoms, recommend 1 unit of PRBC transfusion to improve his fatigue symptoms and to prepare him a better shape to start the next cycle of azacitidine.. Patient will also get Aranesp weekly.   #Patient and his family members to questions and all questions answered to satisfaction.  9/3-9/6, 9/9 cycle 7 Azacitadine 9/12 cbc hold tube Aranesp 9/19. Cbc hold tube Aranesp 9/26  Cbc hold tube Aranesp MD visit.   Patient and family members know to call if any concerns during the interval.  Total face to face encounter time for this patient visit was 25 min. >50% of the time was  spent in counseling and coordination of care.   ZEarlie Server MD, PhD Hematology Oncology [facility] AUniversity Endoscopy CenterPager- [phone]/29/2019

## 2018-04-28 NOTE — Addendum Note (Signed)
Addended by: Earlie Server on: 04/28/2018 02:03 PM   Modules accepted: Miquel Dunn

## 2018-04-29 ENCOUNTER — Inpatient Hospital Stay: Payer: Medicare Other

## 2018-04-29 DIAGNOSIS — D649 Anemia, unspecified: Secondary | ICD-10-CM

## 2018-04-29 DIAGNOSIS — D469 Myelodysplastic syndrome, unspecified: Secondary | ICD-10-CM | POA: Diagnosis not present

## 2018-04-29 MED ORDER — SODIUM CHLORIDE 0.9% IV SOLUTION
250.0000 mL | Freq: Once | INTRAVENOUS | Status: AC
  Administered 2018-04-29: 250 mL via INTRAVENOUS
  Filled 2018-04-29: qty 250

## 2018-04-29 MED ORDER — ACETAMINOPHEN 325 MG PO TABS
650.0000 mg | ORAL_TABLET | Freq: Once | ORAL | Status: AC
  Administered 2018-04-29: 650 mg via ORAL
  Filled 2018-04-29: qty 2

## 2018-04-29 MED ORDER — DIPHENHYDRAMINE HCL 25 MG PO CAPS
25.0000 mg | ORAL_CAPSULE | Freq: Once | ORAL | Status: AC
  Administered 2018-04-29: 25 mg via ORAL
  Filled 2018-04-29: qty 1

## 2018-04-29 NOTE — Patient Instructions (Signed)

## 2018-04-30 LAB — BPAM RBC
Blood Product Expiration Date: 201909062359
ISSUE DATE / TIME: 201908301013
Unit Type and Rh: 7300

## 2018-04-30 LAB — TYPE AND SCREEN
ABO/RH(D): B POS
ANTIBODY SCREEN: NEGATIVE
Unit division: 0

## 2018-05-03 ENCOUNTER — Other Ambulatory Visit: Payer: Self-pay | Admitting: Oncology

## 2018-05-03 ENCOUNTER — Inpatient Hospital Stay: Payer: Medicare Other

## 2018-05-03 ENCOUNTER — Inpatient Hospital Stay: Payer: Medicare Other | Attending: Oncology

## 2018-05-03 VITALS — Resp 18

## 2018-05-03 DIAGNOSIS — Z7984 Long term (current) use of oral hypoglycemic drugs: Secondary | ICD-10-CM | POA: Insufficient documentation

## 2018-05-03 DIAGNOSIS — M1990 Unspecified osteoarthritis, unspecified site: Secondary | ICD-10-CM | POA: Diagnosis not present

## 2018-05-03 DIAGNOSIS — Z5111 Encounter for antineoplastic chemotherapy: Secondary | ICD-10-CM | POA: Insufficient documentation

## 2018-05-03 DIAGNOSIS — Z7982 Long term (current) use of aspirin: Secondary | ICD-10-CM | POA: Diagnosis not present

## 2018-05-03 DIAGNOSIS — Z79899 Other long term (current) drug therapy: Secondary | ICD-10-CM | POA: Insufficient documentation

## 2018-05-03 DIAGNOSIS — I10 Essential (primary) hypertension: Secondary | ICD-10-CM | POA: Insufficient documentation

## 2018-05-03 DIAGNOSIS — D649 Anemia, unspecified: Secondary | ICD-10-CM

## 2018-05-03 DIAGNOSIS — E119 Type 2 diabetes mellitus without complications: Secondary | ICD-10-CM | POA: Insufficient documentation

## 2018-05-03 DIAGNOSIS — Z7189 Other specified counseling: Secondary | ICD-10-CM

## 2018-05-03 DIAGNOSIS — N400 Enlarged prostate without lower urinary tract symptoms: Secondary | ICD-10-CM | POA: Insufficient documentation

## 2018-05-03 DIAGNOSIS — D469 Myelodysplastic syndrome, unspecified: Secondary | ICD-10-CM | POA: Diagnosis not present

## 2018-05-03 DIAGNOSIS — E785 Hyperlipidemia, unspecified: Secondary | ICD-10-CM | POA: Insufficient documentation

## 2018-05-03 LAB — CBC WITH DIFFERENTIAL/PLATELET
Basophils Absolute: 0 10*3/uL (ref 0–0.1)
Basophils Relative: 0 %
EOS ABS: 0 10*3/uL (ref 0–0.7)
EOS PCT: 0 %
HCT: 26.6 % — ABNORMAL LOW (ref 40.0–52.0)
HEMOGLOBIN: 8.1 g/dL — AB (ref 13.0–18.0)
LYMPHS ABS: 1.6 10*3/uL (ref 1.0–3.6)
Lymphocytes Relative: 38 %
MCH: 27.2 pg (ref 26.0–34.0)
MCHC: 30.3 g/dL — AB (ref 32.0–36.0)
MCV: 89.6 fL (ref 80.0–100.0)
MONO ABS: 0.5 10*3/uL (ref 0.2–1.0)
MONOS PCT: 11 %
NEUTROS PCT: 51 %
Neutro Abs: 2.2 10*3/uL (ref 1.4–6.5)
PLATELETS: 302 10*3/uL (ref 150–440)
RBC: 2.96 MIL/uL — ABNORMAL LOW (ref 4.40–5.90)
RDW: 20 % — AB (ref 11.5–14.5)
WBC: 4.2 10*3/uL (ref 3.8–10.6)

## 2018-05-03 LAB — SAMPLE TO BLOOD BANK

## 2018-05-03 MED ORDER — ONDANSETRON HCL 4 MG PO TABS
8.0000 mg | ORAL_TABLET | Freq: Once | ORAL | Status: AC
  Administered 2018-05-03: 8 mg via ORAL
  Filled 2018-05-03: qty 2

## 2018-05-03 MED ORDER — AZACITIDINE CHEMO SQ INJECTION
75.0000 mg/m2 | Freq: Once | INTRAMUSCULAR | Status: AC
  Administered 2018-05-03: 155 mg via SUBCUTANEOUS
  Filled 2018-05-03: qty 6.2

## 2018-05-03 NOTE — Patient Instructions (Signed)
Azacitidine suspension for injection (subcutaneous use) What is this medicine? AZACITIDINE (ay za SITE i deen) is a chemotherapy drug. This medicine reduces the growth of cancer cells and can suppress the immune system. It is used for treating myelodysplastic syndrome or some types of leukemia. This medicine may be used for other purposes; ask your health care provider or pharmacist if you have questions. COMMON BRAND NAME(S): Vidaza What should I tell my health care provider before I take this medicine? They need to know if you have any of these conditions: -kidney disease -liver disease -liver tumors -an unusual or allergic reaction to azacitidine, mannitol, other medicines, foods, dyes, or preservatives -pregnant or trying to get pregnant -breast-feeding How should I use this medicine? This medicine is for injection under the skin. It is administered in a hospital or clinic by a specially trained health care professional. Talk to your pediatrician regarding the use of this medicine in children. While this drug may be prescribed for selected conditions, precautions do apply. Overdosage: If you think you have taken too much of this medicine contact a poison control center or emergency room at once. NOTE: This medicine is only for you. Do not share this medicine with others. What if I miss a dose? It is important not to miss your dose. Call your doctor or health care professional if you are unable to keep an appointment. What may interact with this medicine? Interactions have not been studied. Give your health care provider a list of all the medicines, herbs, non-prescription drugs, or dietary supplements you use. Also tell them if you smoke, drink alcohol, or use illegal drugs. Some items may interact with your medicine. This list may not describe all possible interactions. Give your health care provider a list of all the medicines, herbs, non-prescription drugs, or dietary supplements you  use. Also tell them if you smoke, drink alcohol, or use illegal drugs. Some items may interact with your medicine. What should I watch for while using this medicine? Visit your doctor for checks on your progress. This drug may make you feel generally unwell. This is not uncommon, as chemotherapy can affect healthy cells as well as cancer cells. Report any side effects. Continue your course of treatment even though you feel ill unless your doctor tells you to stop. In some cases, you may be given additional medicines to help with side effects. Follow all directions for their use. Call your doctor or health care professional for advice if you get a fever, chills or sore throat, or other symptoms of a cold or flu. Do not treat yourself. This drug decreases your body's ability to fight infections. Try to avoid being around people who are sick. This medicine may increase your risk to bruise or bleed. Call your doctor or health care professional if you notice any unusual bleeding. You may need blood work done while you are taking this medicine. Do not become pregnant while taking this medicine and for 6 months after the last dose. Women should inform their doctor if they wish to become pregnant or think they might be pregnant. Men should not father a child while taking this medicine and for 3 months after the last dose. There is a potential for serious side effects to an unborn child. Talk to your health care professional or pharmacist for more information. Do not breast-feed an infant while taking this medicine and for 1 week after the last dose. This medicine may interfere with the ability to have a child.   Talk with your doctor or health care professional if you are concerned about your fertility. What side effects may I notice from receiving this medicine? Side effects that you should report to your doctor or health care professional as soon as possible: -allergic reactions like skin rash, itching or hives,  swelling of the face, lips, or tongue -low blood counts - this medicine may decrease the number of white blood cells, red blood cells and platelets. You may be at increased risk for infections and bleeding. -signs of infection - fever or chills, cough, sore throat, pain passing urine -signs of decreased platelets or bleeding - bruising, pinpoint red spots on the skin, black, tarry stools, blood in the urine -signs of decreased red blood cells - unusually weak or tired, fainting spells, lightheadedness -signs and symptoms of kidney injury like trouble passing urine or change in the amount of urine -signs and symptoms of liver injury like dark yellow or brown urine; general ill feeling or flu-like symptoms; light-colored stools; loss of appetite; nausea; right upper belly pain; unusually weak or tired; yellowing of the eyes or skin Side effects that usually do not require medical attention (report to your doctor or health care professional if they continue or are bothersome): -constipation -diarrhea -nausea, vomiting -pain or redness at the injection site -unusually weak or tired This list may not describe all possible side effects. Call your doctor for medical advice about side effects. You may report side effects to FDA at 1-800-FDA-1088. Where should I keep my medicine? This drug is given in a hospital or clinic and will not be stored at home. NOTE: This sheet is a summary. It may not cover all possible information. If you have questions about this medicine, talk to your doctor, pharmacist, or health care provider.  2018 Elsevier/Gold Standard (2016-09-15 14:37:51)  

## 2018-05-04 ENCOUNTER — Inpatient Hospital Stay: Payer: Medicare Other

## 2018-05-04 ENCOUNTER — Other Ambulatory Visit: Payer: Self-pay | Admitting: Oncology

## 2018-05-04 VITALS — BP 103/53 | HR 71 | Temp 96.9°F | Resp 16

## 2018-05-04 DIAGNOSIS — D469 Myelodysplastic syndrome, unspecified: Secondary | ICD-10-CM

## 2018-05-04 DIAGNOSIS — Z5111 Encounter for antineoplastic chemotherapy: Secondary | ICD-10-CM | POA: Diagnosis not present

## 2018-05-04 DIAGNOSIS — Z7189 Other specified counseling: Secondary | ICD-10-CM

## 2018-05-04 MED ORDER — ONDANSETRON HCL 4 MG PO TABS
8.0000 mg | ORAL_TABLET | Freq: Once | ORAL | Status: AC
  Administered 2018-05-04: 8 mg via ORAL
  Filled 2018-05-04: qty 2

## 2018-05-04 MED ORDER — AZACITIDINE CHEMO SQ INJECTION
75.0000 mg/m2 | Freq: Once | INTRAMUSCULAR | Status: AC
  Administered 2018-05-04: 155 mg via SUBCUTANEOUS
  Filled 2018-05-04: qty 6.2

## 2018-05-04 NOTE — Patient Instructions (Signed)
Azacitidine suspension for injection (subcutaneous use) What is this medicine? AZACITIDINE (ay za SITE i deen) is a chemotherapy drug. This medicine reduces the growth of cancer cells and can suppress the immune system. It is used for treating myelodysplastic syndrome or some types of leukemia. This medicine may be used for other purposes; ask your health care provider or pharmacist if you have questions. COMMON BRAND NAME(S): Vidaza What should I tell my health care provider before I take this medicine? They need to know if you have any of these conditions: -kidney disease -liver disease -liver tumors -an unusual or allergic reaction to azacitidine, mannitol, other medicines, foods, dyes, or preservatives -pregnant or trying to get pregnant -breast-feeding How should I use this medicine? This medicine is for injection under the skin. It is administered in a hospital or clinic by a specially trained health care professional. Talk to your pediatrician regarding the use of this medicine in children. While this drug may be prescribed for selected conditions, precautions do apply. Overdosage: If you think you have taken too much of this medicine contact a poison control center or emergency room at once. NOTE: This medicine is only for you. Do not share this medicine with others. What if I miss a dose? It is important not to miss your dose. Call your doctor or health care professional if you are unable to keep an appointment. What may interact with this medicine? Interactions have not been studied. Give your health care provider a list of all the medicines, herbs, non-prescription drugs, or dietary supplements you use. Also tell them if you smoke, drink alcohol, or use illegal drugs. Some items may interact with your medicine. This list may not describe all possible interactions. Give your health care provider a list of all the medicines, herbs, non-prescription drugs, or dietary supplements you  use. Also tell them if you smoke, drink alcohol, or use illegal drugs. Some items may interact with your medicine. What should I watch for while using this medicine? Visit your doctor for checks on your progress. This drug may make you feel generally unwell. This is not uncommon, as chemotherapy can affect healthy cells as well as cancer cells. Report any side effects. Continue your course of treatment even though you feel ill unless your doctor tells you to stop. In some cases, you may be given additional medicines to help with side effects. Follow all directions for their use. Call your doctor or health care professional for advice if you get a fever, chills or sore throat, or other symptoms of a cold or flu. Do not treat yourself. This drug decreases your body's ability to fight infections. Try to avoid being around people who are sick. This medicine may increase your risk to bruise or bleed. Call your doctor or health care professional if you notice any unusual bleeding. You may need blood work done while you are taking this medicine. Do not become pregnant while taking this medicine and for 6 months after the last dose. Women should inform their doctor if they wish to become pregnant or think they might be pregnant. Men should not father a child while taking this medicine and for 3 months after the last dose. There is a potential for serious side effects to an unborn child. Talk to your health care professional or pharmacist for more information. Do not breast-feed an infant while taking this medicine and for 1 week after the last dose. This medicine may interfere with the ability to have a child.   Talk with your doctor or health care professional if you are concerned about your fertility. What side effects may I notice from receiving this medicine? Side effects that you should report to your doctor or health care professional as soon as possible: -allergic reactions like skin rash, itching or hives,  swelling of the face, lips, or tongue -low blood counts - this medicine may decrease the number of white blood cells, red blood cells and platelets. You may be at increased risk for infections and bleeding. -signs of infection - fever or chills, cough, sore throat, pain passing urine -signs of decreased platelets or bleeding - bruising, pinpoint red spots on the skin, black, tarry stools, blood in the urine -signs of decreased red blood cells - unusually weak or tired, fainting spells, lightheadedness -signs and symptoms of kidney injury like trouble passing urine or change in the amount of urine -signs and symptoms of liver injury like dark yellow or brown urine; general ill feeling or flu-like symptoms; light-colored stools; loss of appetite; nausea; right upper belly pain; unusually weak or tired; yellowing of the eyes or skin Side effects that usually do not require medical attention (report to your doctor or health care professional if they continue or are bothersome): -constipation -diarrhea -nausea, vomiting -pain or redness at the injection site -unusually weak or tired This list may not describe all possible side effects. Call your doctor for medical advice about side effects. You may report side effects to FDA at 1-800-FDA-1088. Where should I keep my medicine? This drug is given in a hospital or clinic and will not be stored at home. NOTE: This sheet is a summary. It may not cover all possible information. If you have questions about this medicine, talk to your doctor, pharmacist, or health care provider.  2018 Elsevier/Gold Standard (2016-09-15 14:37:51)  

## 2018-05-05 ENCOUNTER — Inpatient Hospital Stay: Payer: Medicare Other

## 2018-05-05 VITALS — BP 108/58 | HR 71 | Temp 96.8°F | Resp 17

## 2018-05-05 DIAGNOSIS — D469 Myelodysplastic syndrome, unspecified: Secondary | ICD-10-CM

## 2018-05-05 DIAGNOSIS — Z5111 Encounter for antineoplastic chemotherapy: Secondary | ICD-10-CM | POA: Diagnosis not present

## 2018-05-05 DIAGNOSIS — Z7189 Other specified counseling: Secondary | ICD-10-CM

## 2018-05-05 MED ORDER — ONDANSETRON HCL 4 MG PO TABS
8.0000 mg | ORAL_TABLET | Freq: Once | ORAL | Status: AC
  Administered 2018-05-05: 8 mg via ORAL
  Filled 2018-05-05: qty 2

## 2018-05-05 MED ORDER — AZACITIDINE CHEMO SQ INJECTION
75.0000 mg/m2 | Freq: Once | INTRAMUSCULAR | Status: AC
  Administered 2018-05-05: 155 mg via SUBCUTANEOUS
  Filled 2018-05-05: qty 6.2

## 2018-05-06 ENCOUNTER — Inpatient Hospital Stay: Payer: Medicare Other

## 2018-05-06 VITALS — BP 116/61 | HR 70 | Temp 97.0°F | Resp 16

## 2018-05-06 DIAGNOSIS — D469 Myelodysplastic syndrome, unspecified: Secondary | ICD-10-CM

## 2018-05-06 DIAGNOSIS — Z5111 Encounter for antineoplastic chemotherapy: Secondary | ICD-10-CM | POA: Diagnosis not present

## 2018-05-06 DIAGNOSIS — Z7189 Other specified counseling: Secondary | ICD-10-CM

## 2018-05-06 MED ORDER — AZACITIDINE CHEMO SQ INJECTION
75.0000 mg/m2 | Freq: Once | INTRAMUSCULAR | Status: AC
  Administered 2018-05-06: 155 mg via SUBCUTANEOUS
  Filled 2018-05-06 (×2): qty 6.2

## 2018-05-06 MED ORDER — ONDANSETRON HCL 4 MG PO TABS
8.0000 mg | ORAL_TABLET | Freq: Once | ORAL | Status: AC
  Administered 2018-05-06: 8 mg via ORAL
  Filled 2018-05-06: qty 2

## 2018-05-09 ENCOUNTER — Inpatient Hospital Stay: Payer: Medicare Other

## 2018-05-09 VITALS — BP 128/55 | HR 83 | Temp 95.3°F | Resp 16

## 2018-05-09 DIAGNOSIS — D469 Myelodysplastic syndrome, unspecified: Secondary | ICD-10-CM

## 2018-05-09 DIAGNOSIS — Z5111 Encounter for antineoplastic chemotherapy: Secondary | ICD-10-CM | POA: Diagnosis not present

## 2018-05-09 DIAGNOSIS — Z7189 Other specified counseling: Secondary | ICD-10-CM

## 2018-05-09 MED ORDER — AZACITIDINE CHEMO SQ INJECTION
75.0000 mg/m2 | Freq: Once | INTRAMUSCULAR | Status: AC
  Administered 2018-05-09: 155 mg via SUBCUTANEOUS
  Filled 2018-05-09: qty 6.2

## 2018-05-09 MED ORDER — ONDANSETRON HCL 4 MG PO TABS
8.0000 mg | ORAL_TABLET | Freq: Once | ORAL | Status: AC
  Administered 2018-05-09: 8 mg via ORAL
  Filled 2018-05-09: qty 2

## 2018-05-09 NOTE — Patient Instructions (Signed)
Azacitidine suspension for injection (subcutaneous use) What is this medicine? AZACITIDINE (ay za SITE i deen) is a chemotherapy drug. This medicine reduces the growth of cancer cells and can suppress the immune system. It is used for treating myelodysplastic syndrome or some types of leukemia. This medicine may be used for other purposes; ask your health care provider or pharmacist if you have questions. COMMON BRAND NAME(S): Vidaza What should I tell my health care provider before I take this medicine? They need to know if you have any of these conditions: -kidney disease -liver disease -liver tumors -an unusual or allergic reaction to azacitidine, mannitol, other medicines, foods, dyes, or preservatives -pregnant or trying to get pregnant -breast-feeding How should I use this medicine? This medicine is for injection under the skin. It is administered in a hospital or clinic by a specially trained health care professional. Talk to your pediatrician regarding the use of this medicine in children. While this drug may be prescribed for selected conditions, precautions do apply. Overdosage: If you think you have taken too much of this medicine contact a poison control center or emergency room at once. NOTE: This medicine is only for you. Do not share this medicine with others. What if I miss a dose? It is important not to miss your dose. Call your doctor or health care professional if you are unable to keep an appointment. What may interact with this medicine? Interactions have not been studied. Give your health care provider a list of all the medicines, herbs, non-prescription drugs, or dietary supplements you use. Also tell them if you smoke, drink alcohol, or use illegal drugs. Some items may interact with your medicine. This list may not describe all possible interactions. Give your health care provider a list of all the medicines, herbs, non-prescription drugs, or dietary supplements you  use. Also tell them if you smoke, drink alcohol, or use illegal drugs. Some items may interact with your medicine. What should I watch for while using this medicine? Visit your doctor for checks on your progress. This drug may make you feel generally unwell. This is not uncommon, as chemotherapy can affect healthy cells as well as cancer cells. Report any side effects. Continue your course of treatment even though you feel ill unless your doctor tells you to stop. In some cases, you may be given additional medicines to help with side effects. Follow all directions for their use. Call your doctor or health care professional for advice if you get a fever, chills or sore throat, or other symptoms of a cold or flu. Do not treat yourself. This drug decreases your body's ability to fight infections. Try to avoid being around people who are sick. This medicine may increase your risk to bruise or bleed. Call your doctor or health care professional if you notice any unusual bleeding. You may need blood work done while you are taking this medicine. Do not become pregnant while taking this medicine and for 6 months after the last dose. Women should inform their doctor if they wish to become pregnant or think they might be pregnant. Men should not father a child while taking this medicine and for 3 months after the last dose. There is a potential for serious side effects to an unborn child. Talk to your health care professional or pharmacist for more information. Do not breast-feed an infant while taking this medicine and for 1 week after the last dose. This medicine may interfere with the ability to have a child.   Talk with your doctor or health care professional if you are concerned about your fertility. What side effects may I notice from receiving this medicine? Side effects that you should report to your doctor or health care professional as soon as possible: -allergic reactions like skin rash, itching or hives,  swelling of the face, lips, or tongue -low blood counts - this medicine may decrease the number of white blood cells, red blood cells and platelets. You may be at increased risk for infections and bleeding. -signs of infection - fever or chills, cough, sore throat, pain passing urine -signs of decreased platelets or bleeding - bruising, pinpoint red spots on the skin, black, tarry stools, blood in the urine -signs of decreased red blood cells - unusually weak or tired, fainting spells, lightheadedness -signs and symptoms of kidney injury like trouble passing urine or change in the amount of urine -signs and symptoms of liver injury like dark yellow or brown urine; general ill feeling or flu-like symptoms; light-colored stools; loss of appetite; nausea; right upper belly pain; unusually weak or tired; yellowing of the eyes or skin Side effects that usually do not require medical attention (report to your doctor or health care professional if they continue or are bothersome): -constipation -diarrhea -nausea, vomiting -pain or redness at the injection site -unusually weak or tired This list may not describe all possible side effects. Call your doctor for medical advice about side effects. You may report side effects to FDA at 1-800-FDA-1088. Where should I keep my medicine? This drug is given in a hospital or clinic and will not be stored at home. NOTE: This sheet is a summary. It may not cover all possible information. If you have questions about this medicine, talk to your doctor, pharmacist, or health care provider.  2018 Elsevier/Gold Standard (2016-09-15 14:37:51)  

## 2018-05-12 ENCOUNTER — Other Ambulatory Visit: Payer: Self-pay | Admitting: Oncology

## 2018-05-12 ENCOUNTER — Inpatient Hospital Stay: Payer: Medicare Other

## 2018-05-12 VITALS — BP 102/39 | HR 70 | Temp 97.2°F | Resp 16

## 2018-05-12 DIAGNOSIS — D649 Anemia, unspecified: Secondary | ICD-10-CM

## 2018-05-12 DIAGNOSIS — Z5111 Encounter for antineoplastic chemotherapy: Secondary | ICD-10-CM | POA: Diagnosis not present

## 2018-05-12 DIAGNOSIS — D469 Myelodysplastic syndrome, unspecified: Secondary | ICD-10-CM

## 2018-05-12 LAB — CBC WITH DIFFERENTIAL/PLATELET
BASOS ABS: 0 10*3/uL (ref 0–0.1)
Band Neutrophils: 1 %
Basophils Relative: 0 %
EOS ABS: 0 10*3/uL (ref 0–0.7)
Eosinophils Relative: 0 %
HCT: 25.5 % — ABNORMAL LOW (ref 40.0–52.0)
Hemoglobin: 7.7 g/dL — ABNORMAL LOW (ref 13.0–18.0)
LYMPHS ABS: 1.4 10*3/uL (ref 1.0–3.6)
LYMPHS PCT: 36 %
MCH: 26.6 pg (ref 26.0–34.0)
MCHC: 30 g/dL — ABNORMAL LOW (ref 32.0–36.0)
MCV: 88.8 fL (ref 80.0–100.0)
MONO ABS: 0.5 10*3/uL (ref 0.2–1.0)
Monocytes Relative: 13 %
NEUTROS ABS: 1.9 10*3/uL (ref 1.4–6.5)
NEUTROS PCT: 50 %
PLATELETS: 337 10*3/uL (ref 150–440)
RBC: 2.88 MIL/uL — ABNORMAL LOW (ref 4.40–5.90)
RDW: 18.3 % — AB (ref 11.5–14.5)
WBC: 3.8 10*3/uL (ref 3.8–10.6)
nRBC: 1 /100 WBC — ABNORMAL HIGH

## 2018-05-12 LAB — SAMPLE TO BLOOD BANK

## 2018-05-12 MED ORDER — DARBEPOETIN ALFA 300 MCG/0.6ML IJ SOSY
300.0000 ug | PREFILLED_SYRINGE | Freq: Once | INTRAMUSCULAR | Status: AC
  Administered 2018-05-12: 300 ug via SUBCUTANEOUS

## 2018-05-12 MED ORDER — DARBEPOETIN ALFA 150 MCG/0.3ML IJ SOSY: 150.0000 ug | PREFILLED_SYRINGE | Freq: Once | INTRAMUSCULAR | Status: DC

## 2018-05-12 NOTE — Patient Instructions (Signed)

## 2018-05-12 NOTE — Progress Notes (Unsigned)
Patient requested me to speak with Dr. Tasia Catchings concerning the possibility of him having his eye surgeon work on the lens of his eye.  Dr. Tasia Catchings said that his white count and platelets looked fine and that patient was okay to have his eye worked on from her stand point.  Information relayed to patient and his wife.  Understanding was verbalized.

## 2018-05-19 ENCOUNTER — Inpatient Hospital Stay: Payer: Medicare Other

## 2018-05-19 VITALS — BP 101/58 | HR 70 | Temp 94.7°F | Resp 16

## 2018-05-19 DIAGNOSIS — D649 Anemia, unspecified: Secondary | ICD-10-CM

## 2018-05-19 DIAGNOSIS — D469 Myelodysplastic syndrome, unspecified: Secondary | ICD-10-CM

## 2018-05-19 DIAGNOSIS — D630 Anemia in neoplastic disease: Secondary | ICD-10-CM

## 2018-05-19 DIAGNOSIS — Z5111 Encounter for antineoplastic chemotherapy: Secondary | ICD-10-CM | POA: Diagnosis not present

## 2018-05-19 LAB — CBC WITH DIFFERENTIAL/PLATELET
BASOS ABS: 0 10*3/uL (ref 0–0.1)
Basophils Relative: 0 %
EOS ABS: 0 10*3/uL (ref 0–0.7)
Eosinophils Relative: 1 %
HCT: 24.7 % — ABNORMAL LOW (ref 40.0–52.0)
Hemoglobin: 7.5 g/dL — ABNORMAL LOW (ref 13.0–18.0)
Lymphocytes Relative: 43 %
Lymphs Abs: 1.6 10*3/uL (ref 1.0–3.6)
MCH: 27.1 pg (ref 26.0–34.0)
MCHC: 30.4 g/dL — ABNORMAL LOW (ref 32.0–36.0)
MCV: 89.2 fL (ref 80.0–100.0)
MONOS PCT: 11 %
Monocytes Absolute: 0.4 10*3/uL (ref 0.2–1.0)
NEUTROS ABS: 1.7 10*3/uL (ref 1.4–6.5)
Neutrophils Relative %: 45 %
Platelets: 252 10*3/uL (ref 150–440)
RBC: 2.77 MIL/uL — AB (ref 4.40–5.90)
RDW: 19.4 % — AB (ref 11.5–14.5)
WBC: 3.7 10*3/uL — ABNORMAL LOW (ref 3.8–10.6)

## 2018-05-19 LAB — COMPREHENSIVE METABOLIC PANEL
ALBUMIN: 4.3 g/dL (ref 3.5–5.0)
ALT: 16 U/L (ref 0–44)
AST: 21 U/L (ref 15–41)
Alkaline Phosphatase: 26 U/L — ABNORMAL LOW (ref 38–126)
Anion gap: 9 (ref 5–15)
BUN: 22 mg/dL (ref 8–23)
CHLORIDE: 102 mmol/L (ref 98–111)
CO2: 24 mmol/L (ref 22–32)
Calcium: 8.8 mg/dL — ABNORMAL LOW (ref 8.9–10.3)
Creatinine, Ser: 0.7 mg/dL (ref 0.61–1.24)
GFR calc Af Amer: >60 mL/min (ref >60)
GFR calc non Af Amer: >60 mL/min (ref >60)
GLUCOSE: 193 mg/dL — AB (ref 70–99)
POTASSIUM: 4.5 mmol/L (ref 3.5–5.1)
SODIUM: 135 mmol/L (ref 135–145)
Total Bilirubin: 0.7 mg/dL (ref 0.3–1.2)
Total Protein: 7.1 g/dL (ref 6.5–8.1)

## 2018-05-19 LAB — SAMPLE TO BLOOD BANK

## 2018-05-19 MED ORDER — DARBEPOETIN ALFA 300 MCG/0.6ML IJ SOSY: 300.0000 ug | PREFILLED_SYRINGE | Freq: Once | INTRAMUSCULAR | Status: DC

## 2018-05-19 MED ORDER — DARBEPOETIN ALFA 300 MCG/0.6ML IJ SOSY
300.0000 ug | PREFILLED_SYRINGE | Freq: Once | INTRAMUSCULAR | Status: AC
  Administered 2018-05-19: 300 ug via SUBCUTANEOUS

## 2018-05-19 NOTE — Patient Instructions (Signed)

## 2018-05-26 ENCOUNTER — Other Ambulatory Visit: Payer: Self-pay

## 2018-05-26 ENCOUNTER — Inpatient Hospital Stay: Payer: Medicare Other

## 2018-05-26 ENCOUNTER — Inpatient Hospital Stay (HOSPITAL_BASED_OUTPATIENT_CLINIC_OR_DEPARTMENT_OTHER): Payer: Medicare Other | Admitting: Oncology

## 2018-05-26 ENCOUNTER — Encounter: Payer: Self-pay | Admitting: Oncology

## 2018-05-26 VITALS — BP 126/54 | HR 73 | Temp 94.8°F | Wt 174.7 lb

## 2018-05-26 DIAGNOSIS — D469 Myelodysplastic syndrome, unspecified: Secondary | ICD-10-CM

## 2018-05-26 DIAGNOSIS — D649 Anemia, unspecified: Secondary | ICD-10-CM

## 2018-05-26 DIAGNOSIS — Z5111 Encounter for antineoplastic chemotherapy: Secondary | ICD-10-CM

## 2018-05-26 DIAGNOSIS — Z79899 Other long term (current) drug therapy: Secondary | ICD-10-CM

## 2018-05-26 LAB — SAMPLE TO BLOOD BANK

## 2018-05-26 LAB — CBC WITH DIFFERENTIAL/PLATELET
Basophils Absolute: 0 10*3/uL (ref 0–0.1)
Basophils Relative: 0 %
EOS ABS: 0 10*3/uL (ref 0–0.7)
EOS PCT: 0 %
HCT: 23.9 % — ABNORMAL LOW (ref 40.0–52.0)
Hemoglobin: 7.2 g/dL — ABNORMAL LOW (ref 13.0–18.0)
LYMPHS ABS: 1.3 10*3/uL (ref 1.0–3.6)
LYMPHS PCT: 41 %
MCH: 27.2 pg (ref 26.0–34.0)
MCHC: 30.1 g/dL — ABNORMAL LOW (ref 32.0–36.0)
MCV: 90.2 fL (ref 80.0–100.0)
MONO ABS: 0.3 10*3/uL (ref 0.2–1.0)
Monocytes Relative: 11 %
Neutro Abs: 1.5 10*3/uL (ref 1.4–6.5)
Neutrophils Relative %: 48 %
PLATELETS: 240 10*3/uL (ref 150–440)
RBC: 2.64 MIL/uL — ABNORMAL LOW (ref 4.40–5.90)
RDW: 20.1 % — AB (ref 11.5–14.5)
WBC: 3.2 10*3/uL — ABNORMAL LOW (ref 3.8–10.6)

## 2018-05-26 MED ORDER — DARBEPOETIN ALFA 300 MCG/0.6ML IJ SOSY
300.0000 ug | PREFILLED_SYRINGE | Freq: Once | INTRAMUSCULAR | Status: AC
  Administered 2018-05-26: 300 ug via SUBCUTANEOUS

## 2018-05-26 NOTE — Patient Instructions (Signed)

## 2018-05-26 NOTE — Progress Notes (Signed)
Patient here today for follow up.  Patient states no new concerns today  

## 2018-05-28 IMAGING — CT CT ABD-PELV W/O CM
2 of 4 series · 13 of 46 positions shown, 15 images · non-contrast
Comparison: 07/24/2005

CLINICAL DATA: Anemia.

EXAM:
CT ABDOMEN AND PELVIS WITHOUT CONTRAST
TECHNIQUE: Multidetector CT imaging of the abdomen and pelvis was performed
following the standard protocol without IV contrast.

[Series 2: routine abdomen pelvis without 5.00 br40 s3 ax · axial · non-contrast · 0.63mm/px · z∈[-1720,-1301]mm · 10 of 102 slices shown, 12 images]
[im 9/102  soft-tissue]
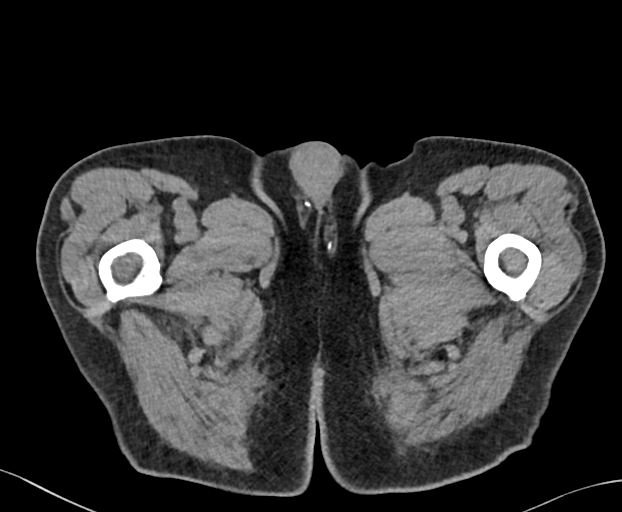
[im 9/102  bone]
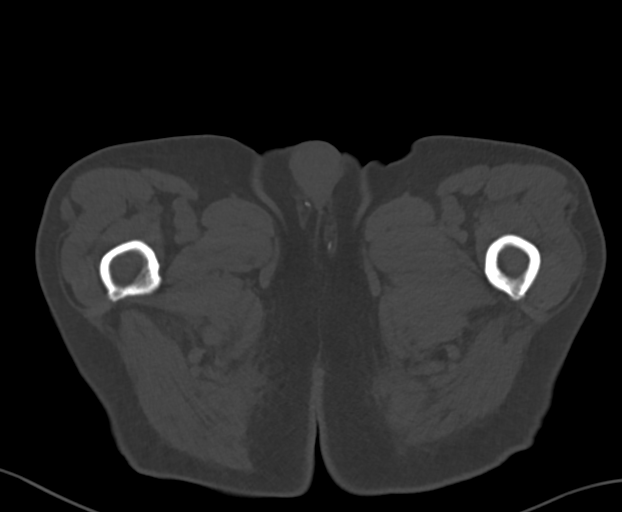
[im 17/102  soft-tissue]
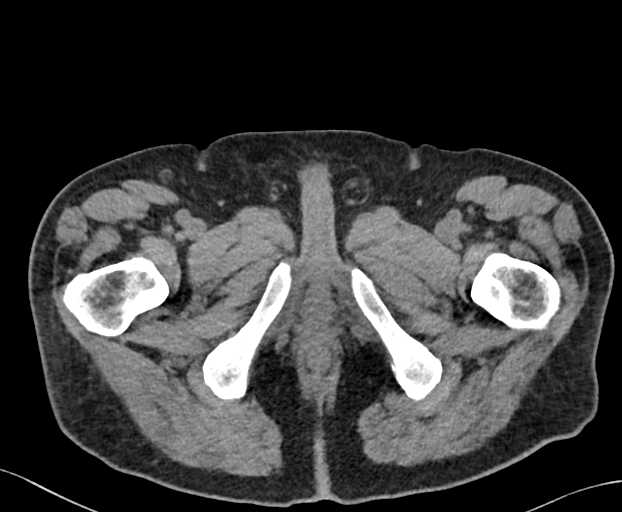
[im 29/102  soft-tissue]
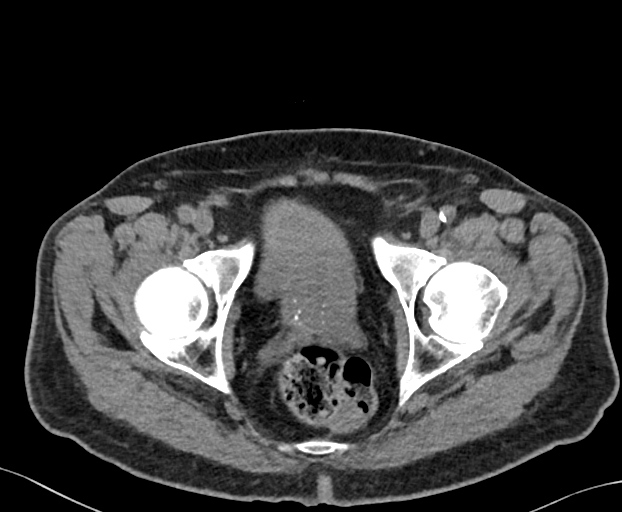
[im 37/102  soft-tissue]
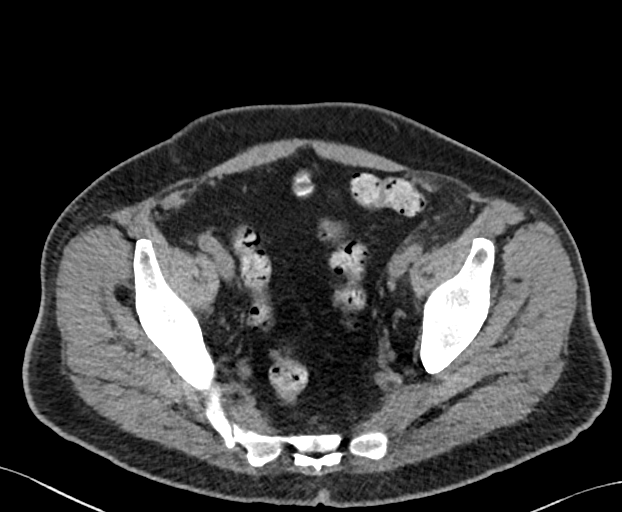
[im 45/102  soft-tissue]
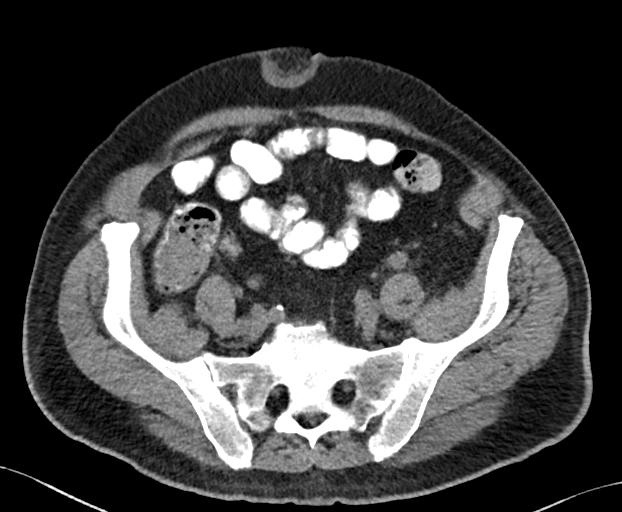
[im 57/102  soft-tissue]
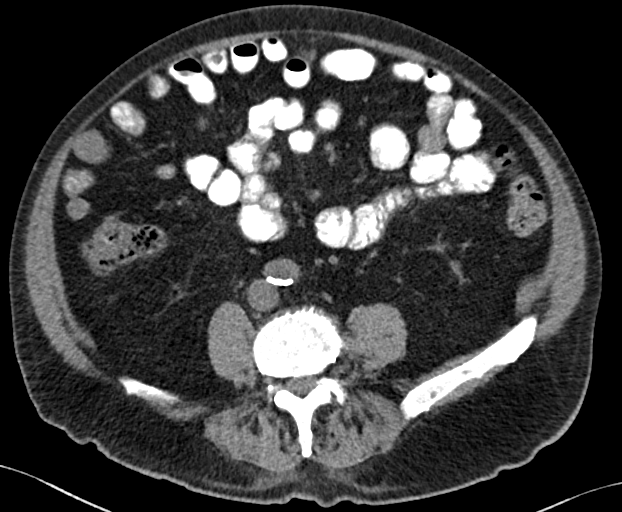
[im 65/102  soft-tissue]
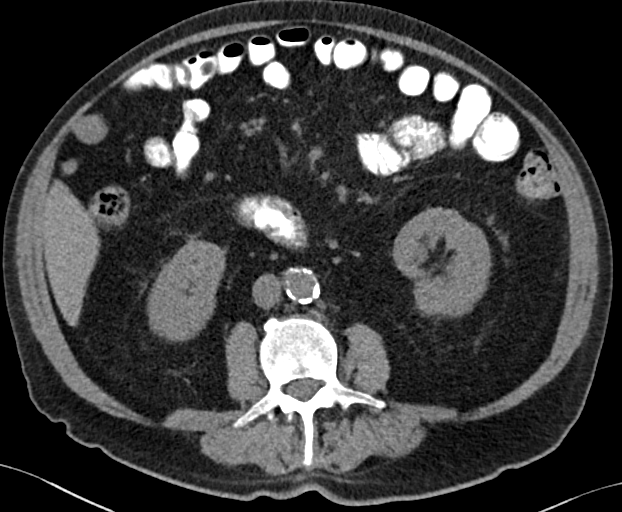
[im 77/102  soft-tissue]
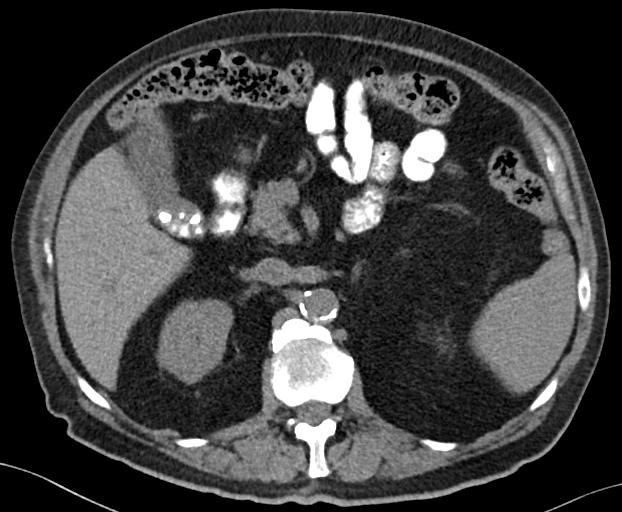
[im 85/102  soft-tissue]
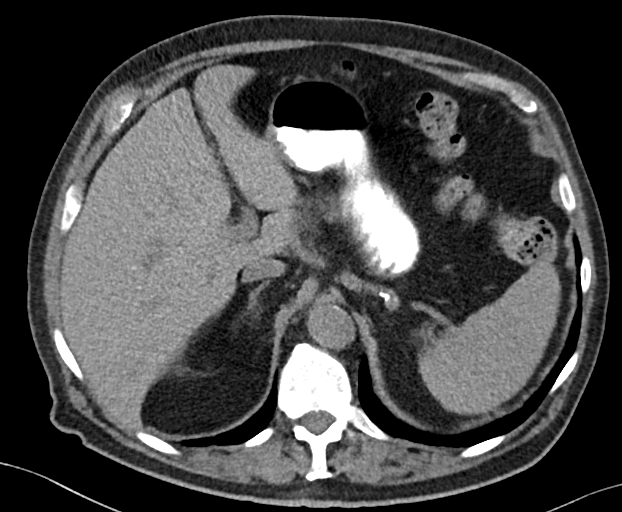
[im 85/102  bone]
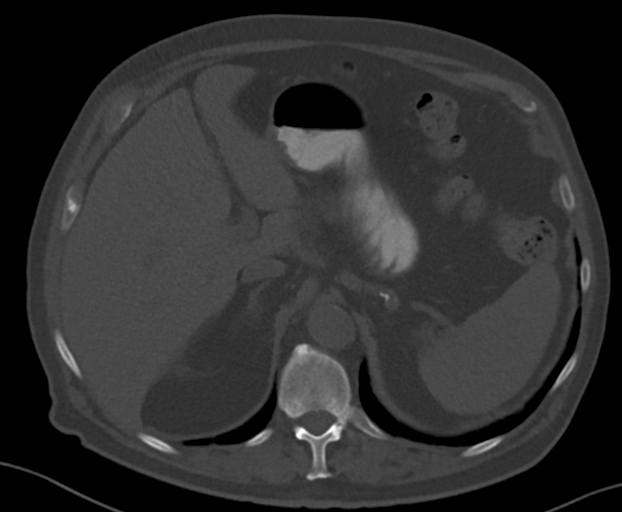
[im 93/102  soft-tissue]
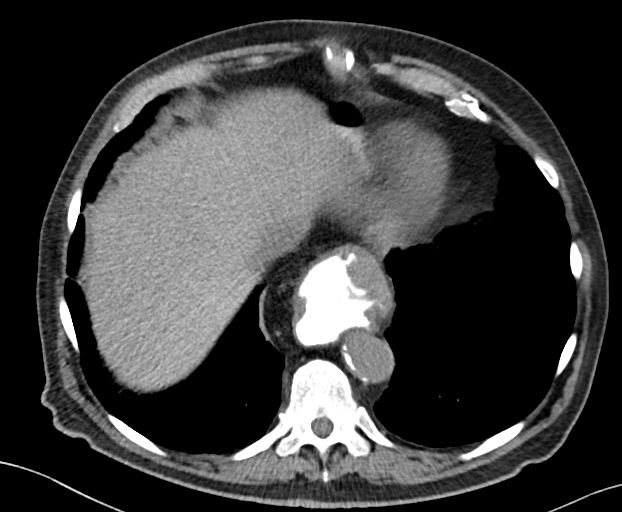

[Series 6: routine abdomen pelvis without 2.00 br40 s3 cor · coronal · non-contrast · 0.76mm/px · 3 of 161 slices shown]
[im 54/161  soft-tissue]
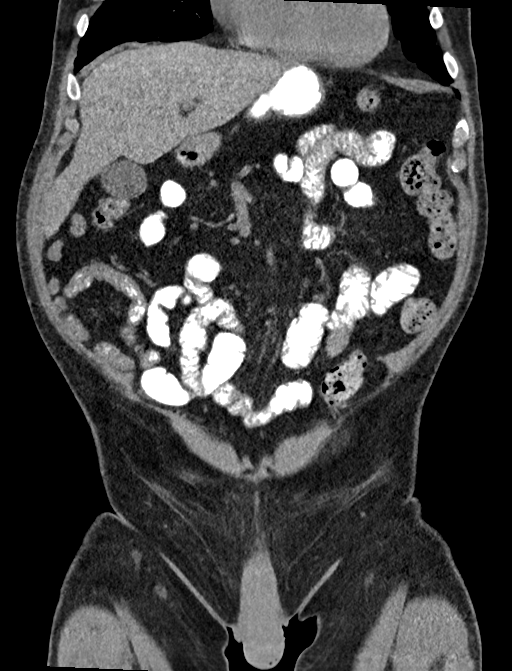
[im 72/161  soft-tissue]
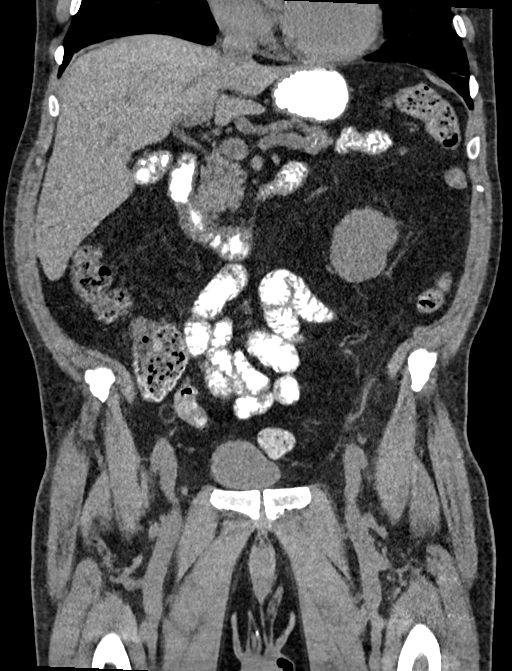
[im 89/161  soft-tissue]
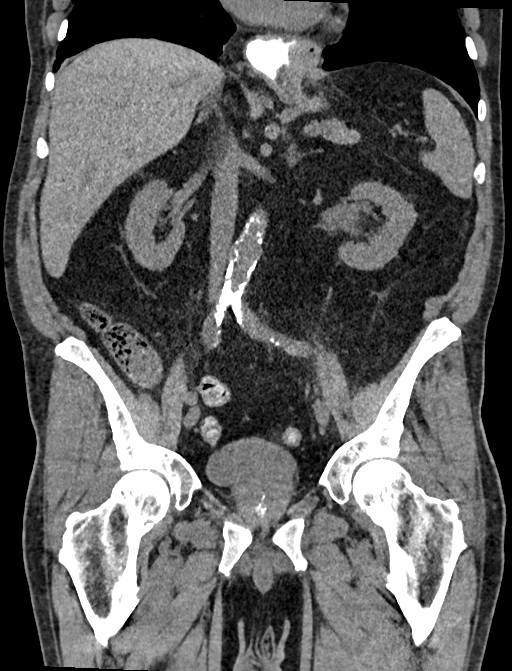

[13 of 46 positions shown; findings below may reference images not displayed]

FINDINGS: Lower chest: The lung bases are clear of acute process. No pleural
effusion or pulmonary lesions. The heart is normal in size. No
pericardial effusion. Moderate tortuosity and calcification of the
distal descending thoracic aorta and large hiatal hernia and GE
reflux.

Hepatobiliary: No focal hepatic lesions or intrahepatic biliary
dilatation. Numerous calcified gallstones are noted in the
gallbladder but no findings for acute cholecystitis. No common bile
duct dilatation.

Pancreas: No mass, inflammation or ductal dilatation.

Spleen: Normal size.  No focal lesions.

Adrenals/Urinary Tract: Adrenal the adrenal glands and kidneys are
unremarkable. No renal, ureteral or bladder calculi or mass.

Stomach/Bowel: The stomach, duodenum, small bowel and colon are
grossly normal. No acute inflammatory process, mass lesions or
obstructive findings. Scattered sigmoid colon diverticulosis without
findings for acute diverticulitis. The terminal ileum is normal. The
appendix is surgically absent.

Vascular/Lymphatic: Advanced atherosclerotic calcifications
involving the aorta and branch vessels. No aneurysm. No mesenteric
or retroperitoneal mass or adenopathy.

Reproductive: The prostate gland and seminal vesicles are
unremarkable. Mild prostate gland enlargement and scattered
calcifications.

Other: Periumbilical abdominal wall hernia containing fat and small
amount of fluid.

Musculoskeletal: Degenerative changes involving the spine most
notably at L4-5. Small scattered hemangiomas are noted. No worrisome
bone lesions.
IMPRESSION: 1. No acute abdominal/pelvic findings, mass lesions or
lymphadenopathy.
2. Cholelithiasis but no CT findings to suggest acute cholecystitis.
3. No obvious inflammatory bowel process or mass. No obstructive
findings.
4. Large hiatal hernia.
5. Periumbilical abdominal wall hernia containing fat.

## 2018-05-28 NOTE — Progress Notes (Signed)
Hematology/Oncology Follow Up Note [facility]  [phone] [phone]  Patient Care Team: Tracie Harrier, MD as PCP - General (Internal Medicine)   Name of the patient: Paul Pham  [id number]  1926-08-25   REASON FOR VISIT  follow-up for management of anemia. HISTORY OF PRESENT ILLNESS Dat Derksen. is a  84 y.o.  male with PMH listed below who was present as a follow-up after hospital admission for management of anemia. Patient was seen during his most recent admission on 08/18/2017. Patient is heart hearing. He has been feeling increased fatigue, dyspnea, and hemoglobin has dropped from baseline 10 to 7 within the past couple of months. Last colonoscopy was done 10 years ago. Outpatient stool occult test is negative. Patient also takes iron pills and stool is always black.  # He has had extensive workup including urinalysis negative for hematuria, normal P37 and folic level. Normal TSH, negative hemolysis workup. Her stool occult  is being negative as well. 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 Patient and his family members understand that this condition is not curable.   Treatment:  Azacitidine 75 mg/m2  Day 1-5 of every 28 days cycle.  Cycle 1 3/7-3/8, 3/11-13 cycle 2  12/06/17- 12/10/17, day 1-5 Cycle 3 5/6 -5/10. Cycle 4 6/3-02/04/18 Cycle 5 7/8- 03/11/2018 Cycle 6 8/5- 8/9   Aranesp 168mg weekly  INTERVAL HISTORY WBurley Kopka is a 84y.o. male who has above history reviewed by me today presents for follow-up visit for management of MDS. Status post 7 cycles of azacitidine.  Reports feeling well.  SOB with exertion, chronic.  Continues to feel fatigue at baseline. Fatigue is better after last    Review of  systems Review of Systems  Constitutional: Positive for malaise/fatigue. Negative for chills, diaphoresis, fever and weight loss.  HENT: Negative for congestion, ear discharge, ear pain, hearing loss, nosebleeds, sinus pain, sore throat and tinnitus.   Eyes: Negative for blurred vision, double vision, photophobia, pain, discharge and redness.  Respiratory: Negative for cough, hemoptysis, sputum production, shortness of breath and wheezing.        Shortness of breath with exertion  Cardiovascular: Negative for chest pain, palpitations, orthopnea, claudication and leg swelling.  Gastrointestinal: Negative for abdominal pain, blood in stool, constipation, diarrhea, heartburn, melena, nausea and vomiting.  Genitourinary: Negative for dysuria, flank pain, frequency, hematuria and urgency.  Musculoskeletal: Negative for back pain, myalgias and neck pain.  Skin: Negative for itching and rash.  Neurological: Negative for dizziness, tingling, tremors, sensory change, speech change, focal weakness, weakness and headaches.  Endo/Heme/Allergies: Negative for environmental allergies. Does not bruise/bleed easily.  Psychiatric/Behavioral: Negative for depression, hallucinations, substance abuse and suicidal ideas. The patient is not nervous/anxious.     No Known Allergies   Past Medical History:  Diagnosis Date  . Anemia   . Arthritis   . BPH (benign prostatic hypertrophy)   . Complication of anesthesia    nausea  . Diabetes type 2, controlled ([place]   . HOH (hard of hearing)    r and L ears-70% loss per pt  . Hyperlipidemia   . Hypertension      Past Surgical History:  Procedure Laterality Date  . APPENDECTOMY    . COLONOSCOPY  09/21/08   1 polyp found, tubular  adenoma  . HAND SURGERY    . KNEE SURGERY Left     Social History   Socioeconomic History  . Marital status: Married    Spouse name: Not on file  . Number of children: Not on file  . Years of education: Not on file  .  Highest education level: Not on file  Occupational History  . Not on file  Social Needs  . Financial resource strain: Not on file  . Food insecurity:    Worry: Not on file    Inability: Not on file  . Transportation needs:    Medical: Not on file    Non-medical: Not on file  Tobacco Use  . Smoking status: Never Smoker  . Smokeless tobacco: Never Used  Substance and Sexual Activity  . Alcohol use: No    Alcohol/week: 0.0 standard drinks  . Drug use: No  . Sexual activity: Not on file  Lifestyle  . Physical activity:    Days per week: Not on file    Minutes per session: Not on file  . Stress: Not on file  Relationships  . Social connections:    Talks on phone: Not on file    Gets together: Not on file    Attends religious service: Not on file    Active member of club or organization: Not on file    Attends meetings of clubs or organizations: Not on file    Relationship status: Not on file  . Intimate partner violence:    Fear of current or ex partner: Not on file    Emotionally abused: Not on file    Physically abused: Not on file    Forced sexual activity: Not on file  Other Topics Concern  . Not on file  Social History Narrative  . Not on file    Family History  Problem Relation Age of Onset  . Aneurysm Mother   . Heart disease Father   . Cancer Brother        skin  . Heart disease Brother   . Heart disease Brother   . Cancer Sister        skin  . Aneurysm Brother   . Heart disease Brother   . Heart disease Sister   . Heart disease Sister   . COPD Sister   . Diabetes Sister      Current Outpatient Medications:  .  aspirin EC 81 MG tablet, Take 81 mg by mouth daily., Disp: , Rfl:  .  Cranberry (THERACRAN PO), Take by mouth., Disp: , Rfl:  .  cyanocobalamin 1000 MCG tablet, Take 1,000 mcg by mouth daily., Disp: , Rfl:  .  finasteride (PROSCAR) 5 MG tablet, Take 5 mg by mouth daily., Disp: , Rfl:  .  glucose blood (PRECISION QID TEST) test strip, Use 3  (three) times daily. Use as instructed., Disp: , Rfl:  .  lisinopril (PRINIVIL,ZESTRIL) 10 MG tablet, Take 10 mg by mouth daily., Disp: , Rfl:  .  loperamide (IMODIUM) 2 MG capsule, Take 1 capsule (2 mg total) by mouth See admin instructions. With onset of loose stool, take 69m followed by 280mevery 2 hours until loose bowel movement stopped. Maximum: 16 mg/day, Disp: 30 capsule, Rfl: 0 .  metformin (FORTAMET) 1000 MG (OSM) 24 hr tablet, Take 1,000 mg by mouth 2 (two) times daily with a meal., Disp: , Rfl:  .  Multiple Vitamins-Minerals (CENTRUM SILVER PO), Take by mouth., Disp: , Rfl:  .  Omega-3  Fatty Acids (FISH OIL PO), Take by mouth., Disp: , Rfl:  .  ondansetron (ZOFRAN) 4 MG tablet, Take 1 tablet (4 mg total) by mouth every 6 (six) hours as needed for nausea or vomiting., Disp: 30 tablet, Rfl: 0 .  Polyethylene Glycol 3350 (MIRALAX PO), Take by mouth., Disp: , Rfl:  .  Saw Palmetto-Phytosterols (PROSTATE SR PO), Take by mouth., Disp: , Rfl:  .  simvastatin (ZOCOR) 40 MG tablet, Take 40 mg by mouth daily., Disp: , Rfl:   Physical exam:  Vitals:   05/26/18 0925  BP: (!) 126/54  Pulse: 73  Temp: (!) 94.8 F (34.9 C)  TempSrc: Tympanic  Weight: 174 lb 11.4 oz (79.2 kg)    Physical Exam  Constitutional: He is oriented to person, place, and time. No distress.  HENT:  Head: Normocephalic and atraumatic.  Nose: Nose normal.  Mouth/Throat: Oropharynx is clear and moist. No oropharyngeal exudate.  Eyes: Pupils are equal, round, and reactive to light. EOM are normal. Right eye exhibits no discharge. Left eye exhibits no discharge. No scleral icterus.  Pale conjunctivae   Neck: Normal range of motion. Neck supple. No JVD present.  Cardiovascular: Normal rate, regular rhythm and normal heart sounds.  No murmur heard. Pulmonary/Chest: Effort normal and breath sounds normal. No respiratory distress. He has no wheezes. He has no rales. He exhibits no tenderness.  Abdominal: Soft. Bowel  sounds are normal. He exhibits no distension and no mass. There is no tenderness. There is no rebound.  Musculoskeletal: Normal range of motion. He exhibits no edema, tenderness or deformity.  Lymphadenopathy:    He has no cervical adenopathy.  Neurological: He is alert and oriented to person, place, and time. No cranial nerve deficit. He exhibits normal muscle tone. Coordination normal.  Skin: Skin is warm and dry. No rash noted. He is not diaphoretic. No erythema. There is pallor.  Psychiatric: Affect and judgment normal.    CMP Latest Ref Rng & Units 05/19/2018  Glucose 70 - 99 mg/dL 193(H)  BUN 8 - 23 mg/dL 22  Creatinine 0.61 - 1.24 mg/dL 0.70  Sodium 135 - 145 mmol/L 135  Potassium 3.5 - 5.1 mmol/L 4.5  Chloride 98 - 111 mmol/L 102  CO2 22 - 32 mmol/L 24  Calcium 8.9 - 10.3 mg/dL 8.8(L)  Total Protein 6.5 - 8.1 g/dL 7.1  Total Bilirubin 0.3 - 1.2 mg/dL 0.7  Alkaline Phos 38 - 126 U/L 26(L)  AST 15 - 41 U/L 21  ALT 0 - 44 U/L 16   CBC Latest Ref Rng & Units 05/26/2018  WBC 3.8 - 10.6 K/uL 3.2(L)  Hemoglobin 13.0 - 18.0 g/dL 7.2(L)  Hematocrit 40.0 - 52.0 % 23.9(L)  Platelets 150 - 440 K/uL 240   Patient's workup including stool occult was negative. UA negative for hematuria. Normal W09 and folic levels. normal TSH. Negative hemolysis workup Inappropriate normal reticulocyte count.   09/22/2017  Bone marrow biopsy Diagnosis Bone Marrow, Aspirate,Biopsy, and Clot, right iliac - HYPERCELLULAR BONE MARROW FOR AGE WITH DYSPOIETIC CHANGES.- SEE COMMENT. PERIPHERAL BLOOD: - NORMOCYTIC-HYPOCHROMIC ANEMIA. - LEUKOPENIA. Diagnosis Note The bone marrow is hypercellular for age with dyspoietic changes variably involving myeloid cell lines, but with main involvement of the granulocytic/monocytic cell line. This is associated with bone marrow monocytosis and borderline number to slight increase in blastic cells. The latter is mainly seen by immunohistochemistry. The overall changes  favor a primary myeloid neoplasm, particularly a myelodysplastic syndrome especially refractory anemia with excess blasts (RAEB-1)  or possibly refractory cytopenia with multilineage dysplasia. Consideration was also given to an evolving myelodysplastic/myeloproliferative neoplasm such as chronic myelomonocytic leukemia but the lack of absolute peripheral monocytosis precludes such a diagnosis at this time. Correlation with cytogenetic studies is recommended. (BNS:ah:ecj 09/24/17) The blastic cells represent 5% of all cells Bone Marrow Flow Cytometry - NO SIGNIFICANT CD34 [place] one testing positive for  ASXL1 B979FF*69 EZH2 Splice site 223-0O>B RUNX1 A838s*56 IPSS -R 4.5, intermediate group. 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 With median survival 1.6 years.   Assessment and plan Patient is a 84 y.o. 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 1. MDS (myelodysplastic syndrome) ([place])   2. Symptomatic anemia   3. Encounter for antineoplastic chemotherapy    # MDS: Tolerates azacitidine 9m/m2 Day 1-5 every 28 days. Labs reviewed and counts are acceptable for proceeding Cycle 8 Azacitaidine next week.   # Anemia due to MDS/chemotherapy.  Continue weekly Aranesp. Monitor hemoglobin weekly.  Blood transfusion if hemoglobin <1.   Labs reviewed, counts acceptable for proceeding next cycle of azacitidine.  9/2/ 2019 is a holiday and cancer center is not open.  Therefore patient will start azacitidine on 9/3 2019, his last treatment will be 05/09/2018.  #Patient and his family members to questions and all questions answered to satisfaction.  Patient and family members know to call if any concerns during the interval.  Total face to face encounter time for this patient visit was 25 min. >50% of the time was  spent in counseling and coordination of care.    ZEarlie Server MD, PhD Hematology Oncology CEdwardsville Ambulatory Surgery Center LLCat AAurora [facility] Healthcare-TempePager- [phone]/28/2019

## 2018-05-30 ENCOUNTER — Inpatient Hospital Stay: Payer: Medicare Other

## 2018-05-30 VITALS — BP 119/62 | HR 79 | Temp 97.3°F | Resp 16

## 2018-05-30 DIAGNOSIS — D649 Anemia, unspecified: Secondary | ICD-10-CM

## 2018-05-30 DIAGNOSIS — Z7189 Other specified counseling: Secondary | ICD-10-CM

## 2018-05-30 DIAGNOSIS — D469 Myelodysplastic syndrome, unspecified: Secondary | ICD-10-CM

## 2018-05-30 DIAGNOSIS — Z5111 Encounter for antineoplastic chemotherapy: Secondary | ICD-10-CM | POA: Diagnosis not present

## 2018-05-30 LAB — FERRITIN: Ferritin: 144 ng/mL (ref 24–336)

## 2018-05-30 LAB — IRON AND TIBC
Iron: 122 ug/dL (ref 45–182)
Saturation Ratios: 43 % — ABNORMAL HIGH (ref 17.9–39.5)
TIBC: 281 ug/dL (ref 250–450)
UIBC: 159 ug/dL

## 2018-05-30 LAB — CBC WITH DIFFERENTIAL/PLATELET
BASOS ABS: 0 10*3/uL (ref 0–0.1)
Basophils Relative: 0 %
Eosinophils Absolute: 0 10*3/uL (ref 0–0.7)
Eosinophils Relative: 0 %
HCT: 23.9 % — ABNORMAL LOW (ref 40.0–52.0)
HEMOGLOBIN: 7.3 g/dL — AB (ref 13.0–18.0)
LYMPHS ABS: 1.3 10*3/uL (ref 1.0–3.6)
LYMPHS PCT: 35 %
MCH: 27.6 pg (ref 26.0–34.0)
MCHC: 30.3 g/dL — ABNORMAL LOW (ref 32.0–36.0)
MCV: 90.9 fL (ref 80.0–100.0)
Monocytes Absolute: 0.4 10*3/uL (ref 0.2–1.0)
Monocytes Relative: 10 %
NEUTROS ABS: 2.1 10*3/uL (ref 1.4–6.5)
NEUTROS PCT: 55 %
Platelets: 284 10*3/uL (ref 150–440)
RBC: 2.63 MIL/uL — AB (ref 4.40–5.90)
RDW: 19.7 % — ABNORMAL HIGH (ref 11.5–14.5)
WBC: 3.8 10*3/uL (ref 3.8–10.6)

## 2018-05-30 LAB — SAMPLE TO BLOOD BANK

## 2018-05-30 MED ORDER — AZACITIDINE CHEMO SQ INJECTION
75.0000 mg/m2 | Freq: Once | INTRAMUSCULAR | Status: AC
  Administered 2018-05-30: 155 mg via SUBCUTANEOUS
  Filled 2018-05-30: qty 6.2

## 2018-05-30 MED ORDER — ONDANSETRON HCL 4 MG PO TABS
8.0000 mg | ORAL_TABLET | Freq: Once | ORAL | Status: AC
  Administered 2018-05-30: 8 mg via ORAL
  Filled 2018-05-30: qty 2

## 2018-05-30 NOTE — Patient Instructions (Signed)
Azacitidine suspension for injection (subcutaneous use) What is this medicine? AZACITIDINE (ay za SITE i deen) is a chemotherapy drug. This medicine reduces the growth of cancer cells and can suppress the immune system. It is used for treating myelodysplastic syndrome or some types of leukemia. This medicine may be used for other purposes; ask your health care provider or pharmacist if you have questions. COMMON BRAND NAME(S): Vidaza What should I tell my health care provider before I take this medicine? They need to know if you have any of these conditions: -kidney disease -liver disease -liver tumors -an unusual or allergic reaction to azacitidine, mannitol, other medicines, foods, dyes, or preservatives -pregnant or trying to get pregnant -breast-feeding How should I use this medicine? This medicine is for injection under the skin. It is administered in a hospital or clinic by a specially trained health care professional. Talk to your pediatrician regarding the use of this medicine in children. While this drug may be prescribed for selected conditions, precautions do apply. Overdosage: If you think you have taken too much of this medicine contact a poison control center or emergency room at once. NOTE: This medicine is only for you. Do not share this medicine with others. What if I miss a dose? It is important not to miss your dose. Call your doctor or health care professional if you are unable to keep an appointment. What may interact with this medicine? Interactions have not been studied. Give your health care provider a list of all the medicines, herbs, non-prescription drugs, or dietary supplements you use. Also tell them if you smoke, drink alcohol, or use illegal drugs. Some items may interact with your medicine. This list may not describe all possible interactions. Give your health care provider a list of all the medicines, herbs, non-prescription drugs, or dietary supplements you  use. Also tell them if you smoke, drink alcohol, or use illegal drugs. Some items may interact with your medicine. What should I watch for while using this medicine? Visit your doctor for checks on your progress. This drug may make you feel generally unwell. This is not uncommon, as chemotherapy can affect healthy cells as well as cancer cells. Report any side effects. Continue your course of treatment even though you feel ill unless your doctor tells you to stop. In some cases, you may be given additional medicines to help with side effects. Follow all directions for their use. Call your doctor or health care professional for advice if you get a fever, chills or sore throat, or other symptoms of a cold or flu. Do not treat yourself. This drug decreases your body's ability to fight infections. Try to avoid being around people who are sick. This medicine may increase your risk to bruise or bleed. Call your doctor or health care professional if you notice any unusual bleeding. You may need blood work done while you are taking this medicine. Do not become pregnant while taking this medicine and for 6 months after the last dose. Women should inform their doctor if they wish to become pregnant or think they might be pregnant. Men should not father a child while taking this medicine and for 3 months after the last dose. There is a potential for serious side effects to an unborn child. Talk to your health care professional or pharmacist for more information. Do not breast-feed an infant while taking this medicine and for 1 week after the last dose. This medicine may interfere with the ability to have a child.   Talk with your doctor or health care professional if you are concerned about your fertility. What side effects may I notice from receiving this medicine? Side effects that you should report to your doctor or health care professional as soon as possible: -allergic reactions like skin rash, itching or hives,  swelling of the face, lips, or tongue -low blood counts - this medicine may decrease the number of white blood cells, red blood cells and platelets. You may be at increased risk for infections and bleeding. -signs of infection - fever or chills, cough, sore throat, pain passing urine -signs of decreased platelets or bleeding - bruising, pinpoint red spots on the skin, black, tarry stools, blood in the urine -signs of decreased red blood cells - unusually weak or tired, fainting spells, lightheadedness -signs and symptoms of kidney injury like trouble passing urine or change in the amount of urine -signs and symptoms of liver injury like dark yellow or brown urine; general ill feeling or flu-like symptoms; light-colored stools; loss of appetite; nausea; right upper belly pain; unusually weak or tired; yellowing of the eyes or skin Side effects that usually do not require medical attention (report to your doctor or health care professional if they continue or are bothersome): -constipation -diarrhea -nausea, vomiting -pain or redness at the injection site -unusually weak or tired This list may not describe all possible side effects. Call your doctor for medical advice about side effects. You may report side effects to FDA at 1-800-FDA-1088. Where should I keep my medicine? This drug is given in a hospital or clinic and will not be stored at home. NOTE: This sheet is a summary. It may not cover all possible information. If you have questions about this medicine, talk to your doctor, pharmacist, or health care provider.  2018 Elsevier/Gold Standard (2016-09-15 14:37:51)  

## 2018-05-31 ENCOUNTER — Inpatient Hospital Stay: Payer: Medicare Other | Attending: Oncology

## 2018-05-31 VITALS — BP 123/62 | HR 88 | Temp 97.2°F | Resp 16

## 2018-05-31 DIAGNOSIS — E119 Type 2 diabetes mellitus without complications: Secondary | ICD-10-CM | POA: Diagnosis not present

## 2018-05-31 DIAGNOSIS — N400 Enlarged prostate without lower urinary tract symptoms: Secondary | ICD-10-CM | POA: Insufficient documentation

## 2018-05-31 DIAGNOSIS — M1990 Unspecified osteoarthritis, unspecified site: Secondary | ICD-10-CM | POA: Diagnosis not present

## 2018-05-31 DIAGNOSIS — Z7982 Long term (current) use of aspirin: Secondary | ICD-10-CM | POA: Insufficient documentation

## 2018-05-31 DIAGNOSIS — E785 Hyperlipidemia, unspecified: Secondary | ICD-10-CM | POA: Diagnosis not present

## 2018-05-31 DIAGNOSIS — D469 Myelodysplastic syndrome, unspecified: Secondary | ICD-10-CM | POA: Insufficient documentation

## 2018-05-31 DIAGNOSIS — Z7984 Long term (current) use of oral hypoglycemic drugs: Secondary | ICD-10-CM | POA: Diagnosis not present

## 2018-05-31 DIAGNOSIS — Z79899 Other long term (current) drug therapy: Secondary | ICD-10-CM | POA: Diagnosis not present

## 2018-05-31 DIAGNOSIS — D649 Anemia, unspecified: Secondary | ICD-10-CM | POA: Insufficient documentation

## 2018-05-31 DIAGNOSIS — I10 Essential (primary) hypertension: Secondary | ICD-10-CM | POA: Diagnosis not present

## 2018-05-31 DIAGNOSIS — Z7189 Other specified counseling: Secondary | ICD-10-CM

## 2018-05-31 MED ORDER — ONDANSETRON HCL 4 MG PO TABS
8.0000 mg | ORAL_TABLET | Freq: Once | ORAL | Status: AC
  Administered 2018-05-31: 8 mg via ORAL
  Filled 2018-05-31: qty 2

## 2018-05-31 MED ORDER — AZACITIDINE CHEMO SQ INJECTION
75.0000 mg/m2 | Freq: Once | INTRAMUSCULAR | Status: AC
  Administered 2018-05-31: 155 mg via SUBCUTANEOUS
  Filled 2018-05-31: qty 6.2

## 2018-06-01 ENCOUNTER — Inpatient Hospital Stay: Payer: Medicare Other

## 2018-06-01 VITALS — BP 119/57 | HR 82 | Temp 94.7°F | Resp 18

## 2018-06-01 DIAGNOSIS — D469 Myelodysplastic syndrome, unspecified: Secondary | ICD-10-CM

## 2018-06-01 DIAGNOSIS — Z7189 Other specified counseling: Secondary | ICD-10-CM

## 2018-06-01 MED ORDER — ONDANSETRON HCL 4 MG PO TABS
8.0000 mg | ORAL_TABLET | Freq: Once | ORAL | Status: AC
  Administered 2018-06-01: 8 mg via ORAL

## 2018-06-01 MED ORDER — ONDANSETRON HCL 4 MG PO TABS
ORAL_TABLET | ORAL | Status: AC
  Filled 2018-06-01: qty 2

## 2018-06-01 MED ORDER — AZACITIDINE CHEMO SQ INJECTION
75.0000 mg/m2 | Freq: Once | INTRAMUSCULAR | Status: AC
  Administered 2018-06-01: 155 mg via SUBCUTANEOUS
  Filled 2018-06-01: qty 6.2

## 2018-06-01 NOTE — Patient Instructions (Signed)
Azacitidine suspension for injection (subcutaneous use) What is this medicine? AZACITIDINE (ay za SITE i deen) is a chemotherapy drug. This medicine reduces the growth of cancer cells and can suppress the immune system. It is used for treating myelodysplastic syndrome or some types of leukemia. This medicine may be used for other purposes; ask your health care provider or pharmacist if you have questions. COMMON BRAND NAME(S): Vidaza What should I tell my health care provider before I take this medicine? They need to know if you have any of these conditions: -kidney disease -liver disease -liver tumors -an unusual or allergic reaction to azacitidine, mannitol, other medicines, foods, dyes, or preservatives -pregnant or trying to get pregnant -breast-feeding How should I use this medicine? This medicine is for injection under the skin. It is administered in a hospital or clinic by a specially trained health care professional. Talk to your pediatrician regarding the use of this medicine in children. While this drug may be prescribed for selected conditions, precautions do apply. Overdosage: If you think you have taken too much of this medicine contact a poison control center or emergency room at once. NOTE: This medicine is only for you. Do not share this medicine with others. What if I miss a dose? It is important not to miss your dose. Call your doctor or health care professional if you are unable to keep an appointment. What may interact with this medicine? Interactions have not been studied. Give your health care provider a list of all the medicines, herbs, non-prescription drugs, or dietary supplements you use. Also tell them if you smoke, drink alcohol, or use illegal drugs. Some items may interact with your medicine. This list may not describe all possible interactions. Give your health care provider a list of all the medicines, herbs, non-prescription drugs, or dietary supplements you  use. Also tell them if you smoke, drink alcohol, or use illegal drugs. Some items may interact with your medicine. What should I watch for while using this medicine? Visit your doctor for checks on your progress. This drug may make you feel generally unwell. This is not uncommon, as chemotherapy can affect healthy cells as well as cancer cells. Report any side effects. Continue your course of treatment even though you feel ill unless your doctor tells you to stop. In some cases, you may be given additional medicines to help with side effects. Follow all directions for their use. Call your doctor or health care professional for advice if you get a fever, chills or sore throat, or other symptoms of a cold or flu. Do not treat yourself. This drug decreases your body's ability to fight infections. Try to avoid being around people who are sick. This medicine may increase your risk to bruise or bleed. Call your doctor or health care professional if you notice any unusual bleeding. You may need blood work done while you are taking this medicine. Do not become pregnant while taking this medicine and for 6 months after the last dose. Women should inform their doctor if they wish to become pregnant or think they might be pregnant. Men should not father a child while taking this medicine and for 3 months after the last dose. There is a potential for serious side effects to an unborn child. Talk to your health care professional or pharmacist for more information. Do not breast-feed an infant while taking this medicine and for 1 week after the last dose. This medicine may interfere with the ability to have a child.   Talk with your doctor or health care professional if you are concerned about your fertility. What side effects may I notice from receiving this medicine? Side effects that you should report to your doctor or health care professional as soon as possible: -allergic reactions like skin rash, itching or hives,  swelling of the face, lips, or tongue -low blood counts - this medicine may decrease the number of white blood cells, red blood cells and platelets. You may be at increased risk for infections and bleeding. -signs of infection - fever or chills, cough, sore throat, pain passing urine -signs of decreased platelets or bleeding - bruising, pinpoint red spots on the skin, black, tarry stools, blood in the urine -signs of decreased red blood cells - unusually weak or tired, fainting spells, lightheadedness -signs and symptoms of kidney injury like trouble passing urine or change in the amount of urine -signs and symptoms of liver injury like dark yellow or brown urine; general ill feeling or flu-like symptoms; light-colored stools; loss of appetite; nausea; right upper belly pain; unusually weak or tired; yellowing of the eyes or skin Side effects that usually do not require medical attention (report to your doctor or health care professional if they continue or are bothersome): -constipation -diarrhea -nausea, vomiting -pain or redness at the injection site -unusually weak or tired This list may not describe all possible side effects. Call your doctor for medical advice about side effects. You may report side effects to FDA at 1-800-FDA-1088. Where should I keep my medicine? This drug is given in a hospital or clinic and will not be stored at home. NOTE: This sheet is a summary. It may not cover all possible information. If you have questions about this medicine, talk to your doctor, pharmacist, or health care provider.  2018 Elsevier/Gold Standard (2016-09-15 14:37:51)  

## 2018-06-02 ENCOUNTER — Ambulatory Visit: Payer: TRICARE For Life (TFL)

## 2018-06-02 ENCOUNTER — Inpatient Hospital Stay: Payer: Medicare Other

## 2018-06-02 ENCOUNTER — Ambulatory Visit: Payer: TRICARE For Life (TFL) | Admitting: Oncology

## 2018-06-02 VITALS — BP 136/57 | HR 76 | Temp 97.6°F | Resp 18

## 2018-06-02 DIAGNOSIS — D469 Myelodysplastic syndrome, unspecified: Secondary | ICD-10-CM | POA: Diagnosis not present

## 2018-06-02 DIAGNOSIS — Z7189 Other specified counseling: Secondary | ICD-10-CM

## 2018-06-02 MED ORDER — ONDANSETRON HCL 4 MG PO TABS
8.0000 mg | ORAL_TABLET | Freq: Once | ORAL | Status: AC
  Administered 2018-06-02: 8 mg via ORAL
  Filled 2018-06-02: qty 2

## 2018-06-02 MED ORDER — AZACITIDINE CHEMO SQ INJECTION
75.0000 mg/m2 | Freq: Once | INTRAMUSCULAR | Status: AC
  Administered 2018-06-02: 155 mg via SUBCUTANEOUS
  Filled 2018-06-02: qty 6.2

## 2018-06-03 ENCOUNTER — Inpatient Hospital Stay: Payer: Medicare Other

## 2018-06-03 VITALS — BP 118/58 | HR 82 | Temp 98.2°F

## 2018-06-03 DIAGNOSIS — D469 Myelodysplastic syndrome, unspecified: Secondary | ICD-10-CM

## 2018-06-03 DIAGNOSIS — Z7189 Other specified counseling: Secondary | ICD-10-CM

## 2018-06-03 MED ORDER — DARBEPOETIN ALFA 300 MCG/0.6ML IJ SOSY
300.0000 ug | PREFILLED_SYRINGE | Freq: Once | INTRAMUSCULAR | Status: AC
  Administered 2018-06-03: 300 ug via SUBCUTANEOUS
  Filled 2018-06-03: qty 0.6

## 2018-06-03 MED ORDER — ONDANSETRON HCL 4 MG PO TABS
8.0000 mg | ORAL_TABLET | Freq: Once | ORAL | Status: AC
  Administered 2018-06-03: 8 mg via ORAL
  Filled 2018-06-03: qty 2

## 2018-06-03 MED ORDER — AZACITIDINE CHEMO SQ INJECTION
75.0000 mg/m2 | Freq: Once | INTRAMUSCULAR | Status: AC
  Administered 2018-06-03: 155 mg via SUBCUTANEOUS
  Filled 2018-06-03: qty 6.2

## 2018-06-09 ENCOUNTER — Other Ambulatory Visit: Payer: Self-pay | Admitting: Oncology

## 2018-06-09 ENCOUNTER — Inpatient Hospital Stay: Payer: Medicare Other

## 2018-06-09 ENCOUNTER — Other Ambulatory Visit: Payer: Self-pay | Admitting: *Deleted

## 2018-06-09 VITALS — BP 120/54 | HR 87 | Temp 98.2°F | Resp 16

## 2018-06-09 DIAGNOSIS — D469 Myelodysplastic syndrome, unspecified: Secondary | ICD-10-CM

## 2018-06-09 DIAGNOSIS — D649 Anemia, unspecified: Secondary | ICD-10-CM

## 2018-06-09 LAB — CBC WITH DIFFERENTIAL/PLATELET
ABS IMMATURE GRANULOCYTES: 0.07 10*3/uL (ref 0.00–0.07)
BASOS ABS: 0 10*3/uL (ref 0.0–0.1)
Basophils Relative: 0 %
EOS PCT: 1 %
Eosinophils Absolute: 0 10*3/uL (ref 0.0–0.5)
HCT: 23 % — ABNORMAL LOW (ref 39.0–52.0)
HEMOGLOBIN: 6.6 g/dL — AB (ref 13.0–17.0)
Immature Granulocytes: 1 %
LYMPHS PCT: 28 %
Lymphs Abs: 1.5 10*3/uL (ref 0.7–4.0)
MCH: 26.7 pg (ref 26.0–34.0)
MCHC: 28.7 g/dL — AB (ref 30.0–36.0)
MCV: 93.1 fL (ref 80.0–100.0)
Monocytes Absolute: 0.6 10*3/uL (ref 0.1–1.0)
Monocytes Relative: 11 %
NEUTROS ABS: 3.3 10*3/uL (ref 1.7–7.7)
NRBC: 0.9 % — AB (ref 0.0–0.2)
Neutrophils Relative %: 59 %
Platelets: 256 10*3/uL (ref 150–400)
RBC: 2.47 MIL/uL — AB (ref 4.22–5.81)
RDW: 19.8 % — ABNORMAL HIGH (ref 11.5–15.5)
WBC: 5.5 10*3/uL (ref 4.0–10.5)

## 2018-06-09 LAB — PREPARE RBC (CROSSMATCH)

## 2018-06-09 LAB — SAMPLE TO BLOOD BANK

## 2018-06-09 MED ORDER — DARBEPOETIN ALFA 300 MCG/0.6ML IJ SOSY
300.0000 ug | PREFILLED_SYRINGE | Freq: Once | INTRAMUSCULAR | Status: AC
  Administered 2018-06-09: 300 ug via SUBCUTANEOUS

## 2018-06-09 NOTE — Progress Notes (Signed)
Hemoglobin 6.6 today. Received his Aranesp injection. Scheduled to come back tomorrow for 1 unit of blood. Wife aware of plan.

## 2018-06-10 ENCOUNTER — Inpatient Hospital Stay: Payer: Medicare Other | Attending: Oncology

## 2018-06-10 DIAGNOSIS — D469 Myelodysplastic syndrome, unspecified: Secondary | ICD-10-CM | POA: Insufficient documentation

## 2018-06-10 DIAGNOSIS — D6481 Anemia due to antineoplastic chemotherapy: Secondary | ICD-10-CM | POA: Diagnosis not present

## 2018-06-10 DIAGNOSIS — D649 Anemia, unspecified: Secondary | ICD-10-CM

## 2018-06-10 MED ORDER — SODIUM CHLORIDE 0.9% IV SOLUTION
250.0000 mL | Freq: Once | INTRAVENOUS | Status: AC
  Administered 2018-06-10: 250 mL via INTRAVENOUS
  Filled 2018-06-10: qty 250

## 2018-06-10 MED ORDER — ACETAMINOPHEN 325 MG PO TABS
650.0000 mg | ORAL_TABLET | Freq: Once | ORAL | Status: AC
  Administered 2018-06-10: 650 mg via ORAL

## 2018-06-10 MED ORDER — DIPHENHYDRAMINE HCL 25 MG PO CAPS
ORAL_CAPSULE | ORAL | Status: AC
  Filled 2018-06-10: qty 1

## 2018-06-10 MED ORDER — ACETAMINOPHEN 325 MG PO TABS
ORAL_TABLET | ORAL | Status: AC
  Filled 2018-06-10: qty 2

## 2018-06-10 MED ORDER — DIPHENHYDRAMINE HCL 25 MG PO CAPS
25.0000 mg | ORAL_CAPSULE | Freq: Once | ORAL | Status: AC
  Administered 2018-06-10: 25 mg via ORAL

## 2018-06-10 NOTE — Patient Instructions (Signed)

## 2018-06-12 LAB — BPAM RBC
BLOOD PRODUCT EXPIRATION DATE: 201910302359
ISSUE DATE / TIME: 201910111018
Unit Type and Rh: 7300

## 2018-06-12 LAB — TYPE AND SCREEN
ABO/RH(D): B POS
Antibody Screen: NEGATIVE
Unit division: 0

## 2018-06-16 ENCOUNTER — Inpatient Hospital Stay: Payer: Medicare Other

## 2018-06-16 VITALS — BP 123/61 | HR 69 | Temp 96.9°F | Resp 18

## 2018-06-16 DIAGNOSIS — D649 Anemia, unspecified: Secondary | ICD-10-CM

## 2018-06-16 DIAGNOSIS — D469 Myelodysplastic syndrome, unspecified: Secondary | ICD-10-CM

## 2018-06-16 LAB — CBC WITH DIFFERENTIAL/PLATELET
ABS IMMATURE GRANULOCYTES: 0.11 10*3/uL — AB (ref 0.00–0.07)
BASOS PCT: 0 %
Basophils Absolute: 0 10*3/uL (ref 0.0–0.1)
EOS ABS: 0 10*3/uL (ref 0.0–0.5)
EOS PCT: 0 %
HCT: 27.1 % — ABNORMAL LOW (ref 39.0–52.0)
Hemoglobin: 7.9 g/dL — ABNORMAL LOW (ref 13.0–17.0)
Immature Granulocytes: 2 %
Lymphocytes Relative: 25 %
Lymphs Abs: 1.5 10*3/uL (ref 0.7–4.0)
MCH: 27.1 pg (ref 26.0–34.0)
MCHC: 29.2 g/dL — AB (ref 30.0–36.0)
MCV: 93.1 fL (ref 80.0–100.0)
MONO ABS: 0.6 10*3/uL (ref 0.1–1.0)
Monocytes Relative: 10 %
NEUTROS ABS: 3.6 10*3/uL (ref 1.7–7.7)
Neutrophils Relative %: 63 %
PLATELETS: 138 10*3/uL — AB (ref 150–400)
RBC: 2.91 MIL/uL — AB (ref 4.22–5.81)
RDW: 19.5 % — AB (ref 11.5–15.5)
WBC: 5.8 10*3/uL (ref 4.0–10.5)
nRBC: 0.7 % — ABNORMAL HIGH (ref 0.0–0.2)

## 2018-06-16 LAB — SAMPLE TO BLOOD BANK

## 2018-06-16 MED ORDER — DARBEPOETIN ALFA 300 MCG/0.6ML IJ SOSY
300.0000 ug | PREFILLED_SYRINGE | Freq: Once | INTRAMUSCULAR | Status: AC
  Administered 2018-06-16: 300 ug via SUBCUTANEOUS

## 2018-06-16 NOTE — Patient Instructions (Signed)

## 2018-06-23 ENCOUNTER — Inpatient Hospital Stay (HOSPITAL_BASED_OUTPATIENT_CLINIC_OR_DEPARTMENT_OTHER): Payer: Medicare Other | Admitting: Oncology

## 2018-06-23 ENCOUNTER — Encounter: Payer: Self-pay | Admitting: Oncology

## 2018-06-23 ENCOUNTER — Inpatient Hospital Stay: Payer: Medicare Other

## 2018-06-23 ENCOUNTER — Other Ambulatory Visit: Payer: Self-pay

## 2018-06-23 VITALS — BP 138/53 | HR 65 | Temp 96.2°F | Resp 18 | Wt 178.6 lb

## 2018-06-23 DIAGNOSIS — D469 Myelodysplastic syndrome, unspecified: Secondary | ICD-10-CM | POA: Diagnosis not present

## 2018-06-23 DIAGNOSIS — D649 Anemia, unspecified: Secondary | ICD-10-CM

## 2018-06-23 DIAGNOSIS — Z79899 Other long term (current) drug therapy: Secondary | ICD-10-CM | POA: Diagnosis not present

## 2018-06-23 DIAGNOSIS — D630 Anemia in neoplastic disease: Secondary | ICD-10-CM

## 2018-06-23 DIAGNOSIS — Z5111 Encounter for antineoplastic chemotherapy: Secondary | ICD-10-CM

## 2018-06-23 LAB — COMPREHENSIVE METABOLIC PANEL
ALBUMIN: 4 g/dL (ref 3.5–5.0)
ALK PHOS: 23 U/L — AB (ref 38–126)
ALT: 14 U/L (ref 0–44)
ANION GAP: 7 (ref 5–15)
AST: 19 U/L (ref 15–41)
BUN: 18 mg/dL (ref 8–23)
CO2: 27 mmol/L (ref 22–32)
Calcium: 8.6 mg/dL — ABNORMAL LOW (ref 8.9–10.3)
Chloride: 102 mmol/L (ref 98–111)
Creatinine, Ser: 0.68 mg/dL (ref 0.61–1.24)
GFR calc Af Amer: >60 mL/min (ref >60)
GFR calc non Af Amer: >60 mL/min (ref >60)
GLUCOSE: 186 mg/dL — AB (ref 70–99)
POTASSIUM: 4.6 mmol/L (ref 3.5–5.1)
Sodium: 136 mmol/L (ref 135–145)
Total Bilirubin: 0.8 mg/dL (ref 0.3–1.2)
Total Protein: 6.7 g/dL (ref 6.5–8.1)

## 2018-06-23 LAB — CBC WITH DIFFERENTIAL/PLATELET
ABS IMMATURE GRANULOCYTES: 0.05 10*3/uL (ref 0.00–0.07)
BASOS PCT: 0 %
Basophils Absolute: 0 10*3/uL (ref 0.0–0.1)
Eosinophils Absolute: 0 10*3/uL (ref 0.0–0.5)
Eosinophils Relative: 1 %
HCT: 25.1 % — ABNORMAL LOW (ref 39.0–52.0)
Hemoglobin: 7.2 g/dL — ABNORMAL LOW (ref 13.0–17.0)
Immature Granulocytes: 1 %
Lymphocytes Relative: 31 %
Lymphs Abs: 1.3 10*3/uL (ref 0.7–4.0)
MCH: 26.9 pg (ref 26.0–34.0)
MCHC: 28.7 g/dL — ABNORMAL LOW (ref 30.0–36.0)
MCV: 93.7 fL (ref 80.0–100.0)
MONOS PCT: 9 %
Monocytes Absolute: 0.4 10*3/uL (ref 0.1–1.0)
NEUTROS ABS: 2.3 10*3/uL (ref 1.7–7.7)
NEUTROS PCT: 58 %
PLATELETS: 170 10*3/uL (ref 150–400)
RBC: 2.68 MIL/uL — ABNORMAL LOW (ref 4.22–5.81)
RDW: 19 % — ABNORMAL HIGH (ref 11.5–15.5)
WBC: 4 10*3/uL (ref 4.0–10.5)
nRBC: 0 % (ref 0.0–0.2)

## 2018-06-23 LAB — SAMPLE TO BLOOD BANK

## 2018-06-23 MED ORDER — DARBEPOETIN ALFA 300 MCG/0.6ML IJ SOSY
300.0000 ug | PREFILLED_SYRINGE | Freq: Once | INTRAMUSCULAR | Status: AC
  Administered 2018-06-23: 300 ug via SUBCUTANEOUS

## 2018-06-23 NOTE — Progress Notes (Signed)
Patient here for follow up. No concerns voiced.  °

## 2018-06-24 NOTE — Progress Notes (Signed)
Hematology/Oncology Follow Up Note [facility]  Telephone:([phone] [phone]  Patient Care Team: Tracie Harrier, MD as PCP - General (Internal Medicine)   Name of the patient: Paul Pham  [id number]  08-12-1926   REASON FOR VISIT  follow-up for management of anemia. HISTORY OF PRESENT ILLNESS Paul Pham. is a  84 y.o.  male with PMH listed below who was present as a follow-up after hospital admission for management of anemia. Patient was seen during his most recent admission on 08/18/2017. Patient is heart hearing. He has been feeling increased fatigue, dyspnea, and hemoglobin has dropped from baseline 10 to 7 within the past couple of months. Last colonoscopy was done 10 years ago. Outpatient stool occult test is negative. Patient also takes iron pills and stool is always black.  # He has had extensive workup including urinalysis negative for hematuria, normal N81 and folic level. Normal TSH, negative hemolysis workup. Her stool occult  is being negative as well. 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 Patient and his family members understand that this condition is not curable.   Treatment:  Azacitidine 75 mg/m2  Day 1-5 of every 28 days cycle.  Cycle 1 3/7-3/8, 3/11-13 cycle 2  12/06/17- 12/10/17, day 1-5 Cycle 3 5/6 -5/10. Cycle 4 6/3-02/04/18 Cycle 5 7/8- 03/11/2018 Cycle 6 8/5- 8/9   Aranesp 199mg weekly  INTERVAL HISTORY Paul Pham is a 84y.o. male who has above history reviewed by me today presents for follow-up visit for management of MDS. He has finished 8 cycles of azacitidine. Recently he has received blood transfusion on 04/07/2018, 04/28/2018 and 06/09/2018. Reports fatigue has improved after recent transfusion.    Chronic shortness of breath with exertion.  At baseline. Appetite is good.  Weight is stable.  Review of systems Review of Systems  Constitutional: Positive for malaise/fatigue. Negative for chills, diaphoresis, fever and weight loss.  HENT: Negative for congestion, ear discharge, ear pain, hearing loss, nosebleeds, sinus pain, sore throat and tinnitus.   Eyes: Negative for blurred vision, double vision, photophobia, pain, discharge and redness.  Respiratory: Negative for cough, hemoptysis, sputum production, shortness of breath and wheezing.        Shortness of breath with exertion  Cardiovascular: Negative for chest pain, palpitations, orthopnea, claudication and leg swelling.  Gastrointestinal: Negative for abdominal pain, blood in stool, constipation, diarrhea, heartburn, melena, nausea and vomiting.  Genitourinary: Negative for dysuria, flank pain, frequency, hematuria and urgency.  Musculoskeletal: Negative for back pain, myalgias and neck pain.  Skin: Negative for itching and rash.  Neurological: Negative for dizziness, tingling, tremors, sensory change, speech change, focal weakness, weakness and headaches.  Endo/Heme/Allergies: Negative for environmental allergies. Does not bruise/bleed easily.  Psychiatric/Behavioral: Negative for depression, hallucinations, substance abuse and suicidal ideas. The patient is not nervous/anxious.     No Known Allergies   Past Medical History:  Diagnosis Date  . Anemia   . Arthritis   . BPH (benign prostatic hypertrophy)   . Complication of anesthesia    nausea  . [place] 2, [place]   . HOH (hard of hearing)    r and L ears-70% loss per pt  . Hyperlipidemia   . Hypertension      Past Surgical History:  Procedure Laterality Date  .  APPENDECTOMY    . COLONOSCOPY  09/21/08   1 polyp found, tubular adenoma  . HAND SURGERY    . KNEE SURGERY Left     Social History   Socioeconomic History  . Marital status: Married     Spouse name: Not on file  . Number of children: Not on file  . Years of education: Not on file  . Highest education level: Not on file  Occupational History  . Not on file  Social Needs  . Financial resource strain: Not on file  . Food insecurity:    Worry: Not on file    Inability: Not on file  . Transportation needs:    Medical: Not on file    Non-medical: Not on file  Tobacco Use  . Smoking status: Never Smoker  . Smokeless tobacco: Never Used  Substance and Sexual Activity  . Alcohol use: No    Alcohol/week: 0.0 standard drinks  . Drug use: No  . Sexual activity: Not on file  Lifestyle  . Physical activity:    Days per week: Not on file    Minutes per session: Not on file  . Stress: Not on file  Relationships  . Social connections:    Talks on phone: Not on file    Gets together: Not on file    Attends religious service: Not on file    Active member of club or organization: Not on file    Attends meetings of clubs or organizations: Not on file    Relationship status: Not on file  . Intimate partner violence:    Fear of current or ex partner: Not on file    Emotionally abused: Not on file    Physically abused: Not on file    Forced sexual activity: Not on file  Other Topics Concern  . Not on file  Social History Narrative  . Not on file    Family History  Problem Relation Age of Onset  . Aneurysm Mother   . Heart disease Father   . Cancer Brother        skin  . Heart disease Brother   . Heart disease Brother   . Cancer Sister        skin  . Aneurysm Brother   . Heart disease Brother   . Heart disease Sister   . Heart disease Sister   . COPD Sister   . Diabetes Sister      Current Outpatient Medications:  .  aspirin EC 81 MG tablet, Take 81 mg by mouth daily., Disp: , Rfl:  .  Cranberry (THERACRAN PO), Take by mouth., Disp: , Rfl:  .  cyanocobalamin 1000 MCG tablet, Take 1,000 mcg by mouth daily., Disp: , Rfl:  .  finasteride (PROSCAR) 5 MG  tablet, Take 5 mg by mouth daily., Disp: , Rfl:  .  lisinopril (PRINIVIL,ZESTRIL) 10 MG tablet, Take 10 mg by mouth daily., Disp: , Rfl:  .  loperamide (IMODIUM) 2 MG capsule, Take 1 capsule (2 mg total) by mouth See admin instructions. With onset of loose stool, take 56m followed by 263mevery 2 hours until loose bowel movement stopped. Maximum: 16 mg/day, Disp: 30 capsule, Rfl: 0 .  meclizine (ANTIVERT) 25 MG tablet, Take 25 mg by mouth daily as needed., Disp: , Rfl:  .  metformin (FORTAMET) 1000 MG (OSM) 24 hr tablet, Take 1,000 mg by mouth 2 (two) times daily with a meal., Disp: , Rfl:  .  Multiple Vitamins-Minerals (CENTRUM  SILVER PO), Take by mouth., Disp: , Rfl:  .  Omega-3 Fatty Acids (FISH OIL PO), Take by mouth., Disp: , Rfl:  .  ondansetron (ZOFRAN) 4 MG tablet, Take 1 tablet (4 mg total) by mouth every 6 (six) hours as needed for nausea or vomiting., Disp: 30 tablet, Rfl: 0 .  Polyethylene Glycol 3350 (MIRALAX PO), Take by mouth., Disp: , Rfl:  .  Saw Palmetto-Phytosterols (PROSTATE SR PO), Take by mouth., Disp: , Rfl:  .  simvastatin (ZOCOR) 40 MG tablet, Take 40 mg by mouth daily., Disp: , Rfl:   Physical exam:  Vitals:   06/23/18 1009  BP: (!) 138/53  Pulse: 65  Resp: 18  Temp: (!) 96.2 F (35.7 C)  TempSrc: Tympanic  Weight: 178 lb 9.2 oz (81 kg)    Physical Exam  Constitutional: He is oriented to person, place, and time. No distress.  HENT:  Head: Normocephalic and atraumatic.  Nose: Nose normal.  Mouth/Throat: Oropharynx is clear and moist. No oropharyngeal exudate.  Eyes: Pupils are equal, round, and reactive to light. EOM are normal. Right eye exhibits no discharge. Left eye exhibits no discharge. No scleral icterus.  Pale conjunctivae   Neck: Normal range of motion. Neck supple. No JVD present.  Cardiovascular: Normal rate, regular rhythm and normal heart sounds.  No murmur heard. Pulmonary/Chest: Effort normal and breath sounds normal. No respiratory distress.  He has no wheezes. He has no rales. He exhibits no tenderness.  Abdominal: Soft. Bowel sounds are normal. He exhibits no distension and no mass. There is no tenderness. There is no rebound.  Musculoskeletal: Normal range of motion. He exhibits no edema, tenderness or deformity.  Lymphadenopathy:    He has no cervical adenopathy.  Neurological: He is alert and oriented to person, place, and time. No cranial nerve deficit. He exhibits normal muscle tone. Coordination normal.  Skin: Skin is warm and dry. No rash noted. He is not diaphoretic. No erythema. There is pallor.  Psychiatric: Affect and judgment normal.    CMP Latest Ref Rng & Units 06/23/2018  Glucose 70 - 99 mg/dL 186(H)  BUN 8 - 23 mg/dL 18  Creatinine 0.61 - 1.24 mg/dL 0.68  Sodium 135 - 145 mmol/L 136  Potassium 3.5 - 5.1 mmol/L 4.6  Chloride 98 - 111 mmol/L 102  CO2 22 - 32 mmol/L 27  Calcium 8.9 - 10.3 mg/dL 8.6(L)  Total Protein 6.5 - 8.1 g/dL 6.7  Total Bilirubin 0.3 - 1.2 mg/dL 0.8  Alkaline Phos 38 - 126 U/L 23(L)  AST 15 - 41 U/L 19  ALT 0 - 44 U/L 14   CBC Latest Ref Rng & Units 06/23/2018  WBC 4.0 - 10.5 K/uL 4.0  Hemoglobin 13.0 - 17.0 g/dL 7.2(L)  Hematocrit 39.0 - 52.0 % 25.1(L)  Platelets 150 - 400 K/uL 170   Patient's workup including stool occult was negative. UA negative for hematuria. Normal P95 and folic levels. normal TSH. Negative hemolysis workup Inappropriate normal reticulocyte count.   09/22/2017  Bone marrow biopsy Diagnosis Bone Marrow, Aspirate,Biopsy, and Clot, right iliac - HYPERCELLULAR BONE MARROW FOR AGE WITH DYSPOIETIC CHANGES.- SEE COMMENT. PERIPHERAL BLOOD: - NORMOCYTIC-HYPOCHROMIC ANEMIA. - LEUKOPENIA. Diagnosis Note The bone marrow is hypercellular for age with dyspoietic changes variably involving myeloid cell lines, but with main involvement of the granulocytic/monocytic cell line. This is associated with bone marrow monocytosis and borderline number to slight increase in  blastic cells. The latter is mainly seen by immunohistochemistry. The overall changes favor  a primary myeloid neoplasm, particularly a myelodysplastic syndrome especially refractory anemia with excess blasts (RAEB-1) or possibly refractory cytopenia with multilineage dysplasia. Consideration was also given to an evolving myelodysplastic/myeloproliferative neoplasm such as chronic myelomonocytic leukemia but the lack of absolute peripheral monocytosis precludes such a diagnosis at this time. Correlation with cytogenetic studies is recommended. (BNS:ah:ecj 09/24/17) The blastic cells represent 5% of all cells Bone Marrow Flow Cytometry - NO SIGNIFICANT CD34 [place] one testing positive for  ASXL1 Z128FV*88 EZH2 Splice site 677-3P>V RUNX1 A838s*56 IPSS -R 4.5, intermediate group. 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 With median survival 1.6 years.   Assessment and plan Patient is a 84 y.o. 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 1. MDS (myelodysplastic syndrome) ([place])   2. Symptomatic anemia   3. 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  Counts acceptable to proceed cycle 9 azacitidine day 1- 5 next week.  # Anemia due to MDS/chemotherapy.  Status post 1 PRBC transfusion on 06/09/2018. Hemoglobin improved to 7.9 last week.  Today's hemoglobin 7.2. Proceed with chemotherapy next week. PRBC transfusion to if hemoglobin less than 7. Continue  Aranesp 3029m every other week.  Proceed with 1 dose today.  We spent sufficient time to discuss many aspect of care, questions were answered to patient's satisfaction.  10/28-11/1 Azacitadine.   11/7 lab Aranesp  07/21/2018 Lab and follow up with me for  assessment prior to next treatment.   Total face to face encounter time for this patient visit was 2559m >50% of the time was  spent in counseling and coordination of care.   ZhoEarlie ServerD, PhD Hematology Oncology [facility]ger- [phone]/25/2019

## 2018-06-27 ENCOUNTER — Inpatient Hospital Stay: Payer: Medicare Other

## 2018-06-27 VITALS — BP 118/62 | HR 82 | Temp 97.4°F | Resp 18

## 2018-06-27 DIAGNOSIS — D469 Myelodysplastic syndrome, unspecified: Secondary | ICD-10-CM

## 2018-06-27 MED ORDER — ONDANSETRON HCL 4 MG PO TABS
8.0000 mg | ORAL_TABLET | Freq: Once | ORAL | Status: AC
  Administered 2018-06-27: 8 mg via ORAL
  Filled 2018-06-27: qty 2

## 2018-06-27 MED ORDER — AZACITIDINE CHEMO SQ INJECTION
75.0000 mg/m2 | Freq: Once | INTRAMUSCULAR | Status: AC
  Administered 2018-06-27: 155 mg via SUBCUTANEOUS
  Filled 2018-06-27: qty 6.2

## 2018-06-27 NOTE — Patient Instructions (Signed)
Azacitidine suspension for injection (subcutaneous use) What is this medicine? AZACITIDINE (ay za SITE i deen) is a chemotherapy drug. This medicine reduces the growth of cancer cells and can suppress the immune system. It is used for treating myelodysplastic syndrome or some types of leukemia. This medicine may be used for other purposes; ask your health care provider or pharmacist if you have questions. COMMON BRAND NAME(S): Vidaza What should I tell my health care provider before I take this medicine? They need to know if you have any of these conditions: -kidney disease -liver disease -liver tumors -an unusual or allergic reaction to azacitidine, mannitol, other medicines, foods, dyes, or preservatives -pregnant or trying to get pregnant -breast-feeding How should I use this medicine? This medicine is for injection under the skin. It is administered in a hospital or clinic by a specially trained health care professional. Talk to your pediatrician regarding the use of this medicine in children. While this drug may be prescribed for selected conditions, precautions do apply. Overdosage: If you think you have taken too much of this medicine contact a poison control center or emergency room at once. NOTE: This medicine is only for you. Do not share this medicine with others. What if I miss a dose? It is important not to miss your dose. Call your doctor or health care professional if you are unable to keep an appointment. What may interact with this medicine? Interactions have not been studied. Give your health care provider a list of all the medicines, herbs, non-prescription drugs, or dietary supplements you use. Also tell them if you smoke, drink alcohol, or use illegal drugs. Some items may interact with your medicine. This list may not describe all possible interactions. Give your health care provider a list of all the medicines, herbs, non-prescription drugs, or dietary supplements you  use. Also tell them if you smoke, drink alcohol, or use illegal drugs. Some items may interact with your medicine. What should I watch for while using this medicine? Visit your doctor for checks on your progress. This drug may make you feel generally unwell. This is not uncommon, as chemotherapy can affect healthy cells as well as cancer cells. Report any side effects. Continue your course of treatment even though you feel ill unless your doctor tells you to stop. In some cases, you may be given additional medicines to help with side effects. Follow all directions for their use. Call your doctor or health care professional for advice if you get a fever, chills or sore throat, or other symptoms of a cold or flu. Do not treat yourself. This drug decreases your body's ability to fight infections. Try to avoid being around people who are sick. This medicine may increase your risk to bruise or bleed. Call your doctor or health care professional if you notice any unusual bleeding. You may need blood work done while you are taking this medicine. Do not become pregnant while taking this medicine and for 6 months after the last dose. Women should inform their doctor if they wish to become pregnant or think they might be pregnant. Men should not father a child while taking this medicine and for 3 months after the last dose. There is a potential for serious side effects to an unborn child. Talk to your health care professional or pharmacist for more information. Do not breast-feed an infant while taking this medicine and for 1 week after the last dose. This medicine may interfere with the ability to have a child.   Talk with your doctor or health care professional if you are concerned about your fertility. What side effects may I notice from receiving this medicine? Side effects that you should report to your doctor or health care professional as soon as possible: -allergic reactions like skin rash, itching or hives,  swelling of the face, lips, or tongue -low blood counts - this medicine may decrease the number of white blood cells, red blood cells and platelets. You may be at increased risk for infections and bleeding. -signs of infection - fever or chills, cough, sore throat, pain passing urine -signs of decreased platelets or bleeding - bruising, pinpoint red spots on the skin, black, tarry stools, blood in the urine -signs of decreased red blood cells - unusually weak or tired, fainting spells, lightheadedness -signs and symptoms of kidney injury like trouble passing urine or change in the amount of urine -signs and symptoms of liver injury like dark yellow or brown urine; general ill feeling or flu-like symptoms; light-colored stools; loss of appetite; nausea; right upper belly pain; unusually weak or tired; yellowing of the eyes or skin Side effects that usually do not require medical attention (report to your doctor or health care professional if they continue or are bothersome): -constipation -diarrhea -nausea, vomiting -pain or redness at the injection site -unusually weak or tired This list may not describe all possible side effects. Call your doctor for medical advice about side effects. You may report side effects to FDA at 1-800-FDA-1088. Where should I keep my medicine? This drug is given in a hospital or clinic and will not be stored at home. NOTE: This sheet is a summary. It may not cover all possible information. If you have questions about this medicine, talk to your doctor, pharmacist, or health care provider.  2018 Elsevier/Gold Standard (2016-09-15 14:37:51)  

## 2018-06-28 ENCOUNTER — Inpatient Hospital Stay: Payer: Medicare Other

## 2018-06-28 VITALS — BP 133/58 | HR 69 | Temp 98.4°F | Resp 18

## 2018-06-28 DIAGNOSIS — D469 Myelodysplastic syndrome, unspecified: Secondary | ICD-10-CM | POA: Diagnosis not present

## 2018-06-28 MED ORDER — ONDANSETRON HCL 4 MG PO TABS
8.0000 mg | ORAL_TABLET | Freq: Once | ORAL | Status: AC
  Administered 2018-06-28: 8 mg via ORAL
  Filled 2018-06-28: qty 2

## 2018-06-28 MED ORDER — AZACITIDINE CHEMO SQ INJECTION
75.0000 mg/m2 | Freq: Once | INTRAMUSCULAR | Status: AC
  Administered 2018-06-28: 155 mg via SUBCUTANEOUS
  Filled 2018-06-28: qty 6.2

## 2018-06-28 NOTE — Patient Instructions (Signed)
Azacitidine suspension for injection (subcutaneous use) What is this medicine? AZACITIDINE (ay za SITE i deen) is a chemotherapy drug. This medicine reduces the growth of cancer cells and can suppress the immune system. It is used for treating myelodysplastic syndrome or some types of leukemia. This medicine may be used for other purposes; ask your health care provider or pharmacist if you have questions. COMMON BRAND NAME(S): Vidaza What should I tell my health care provider before I take this medicine? They need to know if you have any of these conditions: -kidney disease -liver disease -liver tumors -an unusual or allergic reaction to azacitidine, mannitol, other medicines, foods, dyes, or preservatives -pregnant or trying to get pregnant -breast-feeding How should I use this medicine? This medicine is for injection under the skin. It is administered in a hospital or clinic by a specially trained health care professional. Talk to your pediatrician regarding the use of this medicine in children. While this drug may be prescribed for selected conditions, precautions do apply. Overdosage: If you think you have taken too much of this medicine contact a poison control center or emergency room at once. NOTE: This medicine is only for you. Do not share this medicine with others. What if I miss a dose? It is important not to miss your dose. Call your doctor or health care professional if you are unable to keep an appointment. What may interact with this medicine? Interactions have not been studied. Give your health care provider a list of all the medicines, herbs, non-prescription drugs, or dietary supplements you use. Also tell them if you smoke, drink alcohol, or use illegal drugs. Some items may interact with your medicine. This list may not describe all possible interactions. Give your health care provider a list of all the medicines, herbs, non-prescription drugs, or dietary supplements you  use. Also tell them if you smoke, drink alcohol, or use illegal drugs. Some items may interact with your medicine. What should I watch for while using this medicine? Visit your doctor for checks on your progress. This drug may make you feel generally unwell. This is not uncommon, as chemotherapy can affect healthy cells as well as cancer cells. Report any side effects. Continue your course of treatment even though you feel ill unless your doctor tells you to stop. In some cases, you may be given additional medicines to help with side effects. Follow all directions for their use. Call your doctor or health care professional for advice if you get a fever, chills or sore throat, or other symptoms of a cold or flu. Do not treat yourself. This drug decreases your body's ability to fight infections. Try to avoid being around people who are sick. This medicine may increase your risk to bruise or bleed. Call your doctor or health care professional if you notice any unusual bleeding. You may need blood work done while you are taking this medicine. Do not become pregnant while taking this medicine and for 6 months after the last dose. Women should inform their doctor if they wish to become pregnant or think they might be pregnant. Men should not father a child while taking this medicine and for 3 months after the last dose. There is a potential for serious side effects to an unborn child. Talk to your health care professional or pharmacist for more information. Do not breast-feed an infant while taking this medicine and for 1 week after the last dose. This medicine may interfere with the ability to have a child.   Talk with your doctor or health care professional if you are concerned about your fertility. What side effects may I notice from receiving this medicine? Side effects that you should report to your doctor or health care professional as soon as possible: -allergic reactions like skin rash, itching or hives,  swelling of the face, lips, or tongue -low blood counts - this medicine may decrease the number of white blood cells, red blood cells and platelets. You may be at increased risk for infections and bleeding. -signs of infection - fever or chills, cough, sore throat, pain passing urine -signs of decreased platelets or bleeding - bruising, pinpoint red spots on the skin, black, tarry stools, blood in the urine -signs of decreased red blood cells - unusually weak or tired, fainting spells, lightheadedness -signs and symptoms of kidney injury like trouble passing urine or change in the amount of urine -signs and symptoms of liver injury like dark yellow or brown urine; general ill feeling or flu-like symptoms; light-colored stools; loss of appetite; nausea; right upper belly pain; unusually weak or tired; yellowing of the eyes or skin Side effects that usually do not require medical attention (report to your doctor or health care professional if they continue or are bothersome): -constipation -diarrhea -nausea, vomiting -pain or redness at the injection site -unusually weak or tired This list may not describe all possible side effects. Call your doctor for medical advice about side effects. You may report side effects to FDA at 1-800-FDA-1088. Where should I keep my medicine? This drug is given in a hospital or clinic and will not be stored at home. NOTE: This sheet is a summary. It may not cover all possible information. If you have questions about this medicine, talk to your doctor, pharmacist, or health care provider.  2018 Elsevier/Gold Standard (2016-09-15 14:37:51)  

## 2018-06-29 ENCOUNTER — Inpatient Hospital Stay: Payer: Medicare Other

## 2018-06-29 VITALS — BP 112/51 | HR 60 | Temp 97.1°F | Resp 18

## 2018-06-29 DIAGNOSIS — D469 Myelodysplastic syndrome, unspecified: Secondary | ICD-10-CM

## 2018-06-29 MED ORDER — AZACITIDINE CHEMO SQ INJECTION
75.0000 mg/m2 | Freq: Once | INTRAMUSCULAR | Status: AC
  Administered 2018-06-29: 155 mg via SUBCUTANEOUS
  Filled 2018-06-29: qty 6.2

## 2018-06-29 MED ORDER — ONDANSETRON HCL 4 MG PO TABS
8.0000 mg | ORAL_TABLET | Freq: Once | ORAL | Status: AC
  Administered 2018-06-29: 8 mg via ORAL

## 2018-06-29 NOTE — Patient Instructions (Signed)
Azacitidine suspension for injection (subcutaneous use) What is this medicine? AZACITIDINE (ay za SITE i deen) is a chemotherapy drug. This medicine reduces the growth of cancer cells and can suppress the immune system. It is used for treating myelodysplastic syndrome or some types of leukemia. This medicine may be used for other purposes; ask your health care provider or pharmacist if you have questions. COMMON BRAND NAME(S): Vidaza What should I tell my health care provider before I take this medicine? They need to know if you have any of these conditions: -kidney disease -liver disease -liver tumors -an unusual or allergic reaction to azacitidine, mannitol, other medicines, foods, dyes, or preservatives -pregnant or trying to get pregnant -breast-feeding How should I use this medicine? This medicine is for injection under the skin. It is administered in a hospital or clinic by a specially trained health care professional. Talk to your pediatrician regarding the use of this medicine in children. While this drug may be prescribed for selected conditions, precautions do apply. Overdosage: If you think you have taken too much of this medicine contact a poison control center or emergency room at once. NOTE: This medicine is only for you. Do not share this medicine with others. What if I miss a dose? It is important not to miss your dose. Call your doctor or health care professional if you are unable to keep an appointment. What may interact with this medicine? Interactions have not been studied. Give your health care provider a list of all the medicines, herbs, non-prescription drugs, or dietary supplements you use. Also tell them if you smoke, drink alcohol, or use illegal drugs. Some items may interact with your medicine. This list may not describe all possible interactions. Give your health care provider a list of all the medicines, herbs, non-prescription drugs, or dietary supplements you  use. Also tell them if you smoke, drink alcohol, or use illegal drugs. Some items may interact with your medicine. What should I watch for while using this medicine? Visit your doctor for checks on your progress. This drug may make you feel generally unwell. This is not uncommon, as chemotherapy can affect healthy cells as well as cancer cells. Report any side effects. Continue your course of treatment even though you feel ill unless your doctor tells you to stop. In some cases, you may be given additional medicines to help with side effects. Follow all directions for their use. Call your doctor or health care professional for advice if you get a fever, chills or sore throat, or other symptoms of a cold or flu. Do not treat yourself. This drug decreases your body's ability to fight infections. Try to avoid being around people who are sick. This medicine may increase your risk to bruise or bleed. Call your doctor or health care professional if you notice any unusual bleeding. You may need blood work done while you are taking this medicine. Do not become pregnant while taking this medicine and for 6 months after the last dose. Women should inform their doctor if they wish to become pregnant or think they might be pregnant. Men should not father a child while taking this medicine and for 3 months after the last dose. There is a potential for serious side effects to an unborn child. Talk to your health care professional or pharmacist for more information. Do not breast-feed an infant while taking this medicine and for 1 week after the last dose. This medicine may interfere with the ability to have a child.   Talk with your doctor or health care professional if you are concerned about your fertility. What side effects may I notice from receiving this medicine? Side effects that you should report to your doctor or health care professional as soon as possible: -allergic reactions like skin rash, itching or hives,  swelling of the face, lips, or tongue -low blood counts - this medicine may decrease the number of white blood cells, red blood cells and platelets. You may be at increased risk for infections and bleeding. -signs of infection - fever or chills, cough, sore throat, pain passing urine -signs of decreased platelets or bleeding - bruising, pinpoint red spots on the skin, black, tarry stools, blood in the urine -signs of decreased red blood cells - unusually weak or tired, fainting spells, lightheadedness -signs and symptoms of kidney injury like trouble passing urine or change in the amount of urine -signs and symptoms of liver injury like dark yellow or brown urine; general ill feeling or flu-like symptoms; light-colored stools; loss of appetite; nausea; right upper belly pain; unusually weak or tired; yellowing of the eyes or skin Side effects that usually do not require medical attention (report to your doctor or health care professional if they continue or are bothersome): -constipation -diarrhea -nausea, vomiting -pain or redness at the injection site -unusually weak or tired This list may not describe all possible side effects. Call your doctor for medical advice about side effects. You may report side effects to FDA at 1-800-FDA-1088. Where should I keep my medicine? This drug is given in a hospital or clinic and will not be stored at home. NOTE: This sheet is a summary. It may not cover all possible information. If you have questions about this medicine, talk to your doctor, pharmacist, or health care provider.  2018 Elsevier/Gold Standard (2016-09-15 14:37:51)  

## 2018-06-30 ENCOUNTER — Inpatient Hospital Stay: Payer: Medicare Other

## 2018-06-30 VITALS — BP 126/61 | HR 86 | Temp 97.4°F | Resp 18

## 2018-06-30 DIAGNOSIS — D469 Myelodysplastic syndrome, unspecified: Secondary | ICD-10-CM | POA: Diagnosis not present

## 2018-06-30 MED ORDER — ONDANSETRON HCL 4 MG PO TABS
8.0000 mg | ORAL_TABLET | Freq: Once | ORAL | Status: AC
  Administered 2018-06-30: 8 mg via ORAL

## 2018-06-30 MED ORDER — AZACITIDINE CHEMO SQ INJECTION
75.0000 mg/m2 | Freq: Once | INTRAMUSCULAR | Status: AC
  Administered 2018-06-30: 155 mg via SUBCUTANEOUS
  Filled 2018-06-30: qty 6.2

## 2018-07-01 ENCOUNTER — Inpatient Hospital Stay: Payer: Medicare Other | Attending: Oncology

## 2018-07-01 VITALS — BP 120/63 | HR 74 | Temp 96.3°F | Resp 18

## 2018-07-01 DIAGNOSIS — Z7984 Long term (current) use of oral hypoglycemic drugs: Secondary | ICD-10-CM | POA: Diagnosis not present

## 2018-07-01 DIAGNOSIS — M1990 Unspecified osteoarthritis, unspecified site: Secondary | ICD-10-CM | POA: Diagnosis not present

## 2018-07-01 DIAGNOSIS — N400 Enlarged prostate without lower urinary tract symptoms: Secondary | ICD-10-CM | POA: Diagnosis not present

## 2018-07-01 DIAGNOSIS — E119 Type 2 diabetes mellitus without complications: Secondary | ICD-10-CM | POA: Diagnosis not present

## 2018-07-01 DIAGNOSIS — Z7982 Long term (current) use of aspirin: Secondary | ICD-10-CM | POA: Diagnosis not present

## 2018-07-01 DIAGNOSIS — D469 Myelodysplastic syndrome, unspecified: Secondary | ICD-10-CM | POA: Diagnosis not present

## 2018-07-01 DIAGNOSIS — Z5111 Encounter for antineoplastic chemotherapy: Secondary | ICD-10-CM | POA: Insufficient documentation

## 2018-07-01 DIAGNOSIS — I10 Essential (primary) hypertension: Secondary | ICD-10-CM | POA: Diagnosis not present

## 2018-07-01 DIAGNOSIS — Z79899 Other long term (current) drug therapy: Secondary | ICD-10-CM | POA: Diagnosis not present

## 2018-07-01 DIAGNOSIS — E785 Hyperlipidemia, unspecified: Secondary | ICD-10-CM | POA: Diagnosis not present

## 2018-07-01 MED ORDER — AZACITIDINE CHEMO SQ INJECTION
75.0000 mg/m2 | Freq: Once | INTRAMUSCULAR | Status: AC
  Administered 2018-07-01: 155 mg via SUBCUTANEOUS
  Filled 2018-07-01: qty 6.2

## 2018-07-01 MED ORDER — ONDANSETRON HCL 4 MG PO TABS
8.0000 mg | ORAL_TABLET | Freq: Once | ORAL | Status: AC
  Administered 2018-07-01: 8 mg via ORAL
  Filled 2018-07-01: qty 2

## 2018-07-01 NOTE — Patient Instructions (Signed)
Azacitidine suspension for injection (subcutaneous use) What is this medicine? AZACITIDINE (ay za SITE i deen) is a chemotherapy drug. This medicine reduces the growth of cancer cells and can suppress the immune system. It is used for treating myelodysplastic syndrome or some types of leukemia. This medicine may be used for other purposes; ask your health care provider or pharmacist if you have questions. COMMON BRAND NAME(S): Vidaza What should I tell my health care provider before I take this medicine? They need to know if you have any of these conditions: -kidney disease -liver disease -liver tumors -an unusual or allergic reaction to azacitidine, mannitol, other medicines, foods, dyes, or preservatives -pregnant or trying to get pregnant -breast-feeding How should I use this medicine? This medicine is for injection under the skin. It is administered in a hospital or clinic by a specially trained health care professional. Talk to your pediatrician regarding the use of this medicine in children. While this drug may be prescribed for selected conditions, precautions do apply. Overdosage: If you think you have taken too much of this medicine contact a poison control center or emergency room at once. NOTE: This medicine is only for you. Do not share this medicine with others. What if I miss a dose? It is important not to miss your dose. Call your doctor or health care professional if you are unable to keep an appointment. What may interact with this medicine? Interactions have not been studied. Give your health care provider a list of all the medicines, herbs, non-prescription drugs, or dietary supplements you use. Also tell them if you smoke, drink alcohol, or use illegal drugs. Some items may interact with your medicine. This list may not describe all possible interactions. Give your health care provider a list of all the medicines, herbs, non-prescription drugs, or dietary supplements you  use. Also tell them if you smoke, drink alcohol, or use illegal drugs. Some items may interact with your medicine. What should I watch for while using this medicine? Visit your doctor for checks on your progress. This drug may make you feel generally unwell. This is not uncommon, as chemotherapy can affect healthy cells as well as cancer cells. Report any side effects. Continue your course of treatment even though you feel ill unless your doctor tells you to stop. In some cases, you may be given additional medicines to help with side effects. Follow all directions for their use. Call your doctor or health care professional for advice if you get a fever, chills or sore throat, or other symptoms of a cold or flu. Do not treat yourself. This drug decreases your body's ability to fight infections. Try to avoid being around people who are sick. This medicine may increase your risk to bruise or bleed. Call your doctor or health care professional if you notice any unusual bleeding. You may need blood work done while you are taking this medicine. Do not become pregnant while taking this medicine and for 6 months after the last dose. Women should inform their doctor if they wish to become pregnant or think they might be pregnant. Men should not father a child while taking this medicine and for 3 months after the last dose. There is a potential for serious side effects to an unborn child. Talk to your health care professional or pharmacist for more information. Do not breast-feed an infant while taking this medicine and for 1 week after the last dose. This medicine may interfere with the ability to have a child.   Talk with your doctor or health care professional if you are concerned about your fertility. What side effects may I notice from receiving this medicine? Side effects that you should report to your doctor or health care professional as soon as possible: -allergic reactions like skin rash, itching or hives,  swelling of the face, lips, or tongue -low blood counts - this medicine may decrease the number of white blood cells, red blood cells and platelets. You may be at increased risk for infections and bleeding. -signs of infection - fever or chills, cough, sore throat, pain passing urine -signs of decreased platelets or bleeding - bruising, pinpoint red spots on the skin, black, tarry stools, blood in the urine -signs of decreased red blood cells - unusually weak or tired, fainting spells, lightheadedness -signs and symptoms of kidney injury like trouble passing urine or change in the amount of urine -signs and symptoms of liver injury like dark yellow or brown urine; general ill feeling or flu-like symptoms; light-colored stools; loss of appetite; nausea; right upper belly pain; unusually weak or tired; yellowing of the eyes or skin Side effects that usually do not require medical attention (report to your doctor or health care professional if they continue or are bothersome): -constipation -diarrhea -nausea, vomiting -pain or redness at the injection site -unusually weak or tired This list may not describe all possible side effects. Call your doctor for medical advice about side effects. You may report side effects to FDA at 1-800-FDA-1088. Where should I keep my medicine? This drug is given in a hospital or clinic and will not be stored at home. NOTE: This sheet is a summary. It may not cover all possible information. If you have questions about this medicine, talk to your doctor, pharmacist, or health care provider.  2018 Elsevier/Gold Standard (2016-09-15 14:37:51)  

## 2018-07-07 ENCOUNTER — Inpatient Hospital Stay: Payer: Medicare Other

## 2018-07-07 VITALS — BP 131/51 | HR 76 | Temp 97.8°F | Resp 18

## 2018-07-07 DIAGNOSIS — D649 Anemia, unspecified: Secondary | ICD-10-CM

## 2018-07-07 DIAGNOSIS — D469 Myelodysplastic syndrome, unspecified: Secondary | ICD-10-CM

## 2018-07-07 DIAGNOSIS — Z5111 Encounter for antineoplastic chemotherapy: Secondary | ICD-10-CM | POA: Diagnosis not present

## 2018-07-07 LAB — CBC WITH DIFFERENTIAL/PLATELET
ABS IMMATURE GRANULOCYTES: 0.11 10*3/uL — AB (ref 0.00–0.07)
BASOS PCT: 0 %
Basophils Absolute: 0 10*3/uL (ref 0.0–0.1)
EOS ABS: 0 10*3/uL (ref 0.0–0.5)
Eosinophils Relative: 1 %
HCT: 24.2 % — ABNORMAL LOW (ref 39.0–52.0)
Hemoglobin: 7 g/dL — ABNORMAL LOW (ref 13.0–17.0)
IMMATURE GRANULOCYTES: 3 %
Lymphocytes Relative: 37 %
Lymphs Abs: 1.5 10*3/uL (ref 0.7–4.0)
MCH: 26.7 pg (ref 26.0–34.0)
MCHC: 28.9 g/dL — ABNORMAL LOW (ref 30.0–36.0)
MCV: 92.4 fL (ref 80.0–100.0)
MONOS PCT: 10 %
Monocytes Absolute: 0.4 10*3/uL (ref 0.1–1.0)
NEUTROS ABS: 2 10*3/uL (ref 1.7–7.7)
NEUTROS PCT: 49 %
NRBC: 0.5 % — AB (ref 0.0–0.2)
PLATELETS: 250 10*3/uL (ref 150–400)
RBC: 2.62 MIL/uL — AB (ref 4.22–5.81)
RDW: 18.2 % — AB (ref 11.5–15.5)
WBC: 4 10*3/uL (ref 4.0–10.5)

## 2018-07-07 LAB — SAMPLE TO BLOOD BANK

## 2018-07-07 MED ORDER — DARBEPOETIN ALFA 300 MCG/0.6ML IJ SOSY
300.0000 ug | PREFILLED_SYRINGE | Freq: Once | INTRAMUSCULAR | Status: AC
  Administered 2018-07-07: 300 ug via SUBCUTANEOUS
  Filled 2018-07-07: qty 0.6

## 2018-07-08 ENCOUNTER — Ambulatory Visit: Payer: TRICARE For Life (TFL)

## 2018-07-13 ENCOUNTER — Other Ambulatory Visit: Payer: Self-pay | Admitting: Oncology

## 2018-07-15 ENCOUNTER — Other Ambulatory Visit: Payer: Self-pay

## 2018-07-15 DIAGNOSIS — D469 Myelodysplastic syndrome, unspecified: Secondary | ICD-10-CM

## 2018-07-21 ENCOUNTER — Other Ambulatory Visit: Payer: Self-pay

## 2018-07-21 ENCOUNTER — Other Ambulatory Visit: Payer: Self-pay | Admitting: *Deleted

## 2018-07-21 ENCOUNTER — Other Ambulatory Visit: Payer: Self-pay | Admitting: Oncology

## 2018-07-21 ENCOUNTER — Inpatient Hospital Stay: Payer: Medicare Other

## 2018-07-21 ENCOUNTER — Inpatient Hospital Stay (HOSPITAL_BASED_OUTPATIENT_CLINIC_OR_DEPARTMENT_OTHER): Payer: Medicare Other | Admitting: Oncology

## 2018-07-21 ENCOUNTER — Encounter: Payer: Self-pay | Admitting: Oncology

## 2018-07-21 VITALS — BP 128/55 | HR 69 | Temp 97.6°F | Resp 16 | Wt 181.7 lb

## 2018-07-21 VITALS — BP 136/58 | HR 69 | Temp 97.0°F | Resp 18

## 2018-07-21 DIAGNOSIS — D649 Anemia, unspecified: Secondary | ICD-10-CM

## 2018-07-21 DIAGNOSIS — D630 Anemia in neoplastic disease: Secondary | ICD-10-CM

## 2018-07-21 DIAGNOSIS — D469 Myelodysplastic syndrome, unspecified: Secondary | ICD-10-CM | POA: Diagnosis not present

## 2018-07-21 DIAGNOSIS — Z7189 Other specified counseling: Secondary | ICD-10-CM

## 2018-07-21 DIAGNOSIS — Z79899 Other long term (current) drug therapy: Secondary | ICD-10-CM

## 2018-07-21 DIAGNOSIS — Z5111 Encounter for antineoplastic chemotherapy: Secondary | ICD-10-CM

## 2018-07-21 LAB — CBC WITH DIFFERENTIAL/PLATELET
ABS IMMATURE GRANULOCYTES: 0.01 10*3/uL (ref 0.00–0.07)
BASOS ABS: 0 10*3/uL (ref 0.0–0.1)
Basophils Relative: 0 %
EOS PCT: 1 %
Eosinophils Absolute: 0 10*3/uL (ref 0.0–0.5)
HEMATOCRIT: 23.4 % — AB (ref 39.0–52.0)
HEMOGLOBIN: 6.7 g/dL — AB (ref 13.0–17.0)
Immature Granulocytes: 0 %
LYMPHS ABS: 1.2 10*3/uL (ref 0.7–4.0)
LYMPHS PCT: 35 %
MCH: 26.6 pg (ref 26.0–34.0)
MCHC: 28.6 g/dL — ABNORMAL LOW (ref 30.0–36.0)
MCV: 92.9 fL (ref 80.0–100.0)
MONO ABS: 0.3 10*3/uL (ref 0.1–1.0)
Monocytes Relative: 7 %
NEUTROS ABS: 2 10*3/uL (ref 1.7–7.7)
Neutrophils Relative %: 57 %
Platelets: 207 10*3/uL (ref 150–400)
RBC: 2.52 MIL/uL — ABNORMAL LOW (ref 4.22–5.81)
RDW: 19.7 % — ABNORMAL HIGH (ref 11.5–15.5)
WBC: 3.5 10*3/uL — ABNORMAL LOW (ref 4.0–10.5)
nRBC: 1.1 % — ABNORMAL HIGH (ref 0.0–0.2)

## 2018-07-21 LAB — PREPARE RBC (CROSSMATCH)

## 2018-07-21 LAB — SAMPLE TO BLOOD BANK

## 2018-07-21 MED ORDER — DARBEPOETIN ALFA 300 MCG/0.6ML IJ SOSY
300.0000 ug | PREFILLED_SYRINGE | Freq: Once | INTRAMUSCULAR | Status: AC
  Administered 2018-07-21: 300 ug via SUBCUTANEOUS

## 2018-07-21 NOTE — Progress Notes (Signed)
Patient here today for follow up and aranesp injection.  Patient states no new concerns today

## 2018-07-22 ENCOUNTER — Inpatient Hospital Stay: Payer: Medicare Other

## 2018-07-22 DIAGNOSIS — D469 Myelodysplastic syndrome, unspecified: Secondary | ICD-10-CM

## 2018-07-22 DIAGNOSIS — D649 Anemia, unspecified: Secondary | ICD-10-CM

## 2018-07-22 DIAGNOSIS — Z5111 Encounter for antineoplastic chemotherapy: Secondary | ICD-10-CM | POA: Diagnosis not present

## 2018-07-22 MED ORDER — DIPHENHYDRAMINE HCL 25 MG PO CAPS
25.0000 mg | ORAL_CAPSULE | Freq: Once | ORAL | Status: AC
  Administered 2018-07-22: 25 mg via ORAL

## 2018-07-22 MED ORDER — DIPHENHYDRAMINE HCL 25 MG PO CAPS
ORAL_CAPSULE | ORAL | Status: AC
  Filled 2018-07-22: qty 1

## 2018-07-22 MED ORDER — SODIUM CHLORIDE 0.9% IV SOLUTION
250.0000 mL | Freq: Once | INTRAVENOUS | Status: AC
  Administered 2018-07-22: 250 mL via INTRAVENOUS
  Filled 2018-07-22: qty 250

## 2018-07-22 MED ORDER — ACETAMINOPHEN 325 MG PO TABS
650.0000 mg | ORAL_TABLET | Freq: Once | ORAL | Status: AC
  Administered 2018-07-22: 650 mg via ORAL

## 2018-07-22 MED ORDER — ACETAMINOPHEN 325 MG PO TABS
ORAL_TABLET | ORAL | Status: AC
  Filled 2018-07-22: qty 2

## 2018-07-22 NOTE — Patient Instructions (Signed)

## 2018-07-23 DIAGNOSIS — D630 Anemia in neoplastic disease: Secondary | ICD-10-CM | POA: Insufficient documentation

## 2018-07-23 DIAGNOSIS — Z5111 Encounter for antineoplastic chemotherapy: Secondary | ICD-10-CM | POA: Insufficient documentation

## 2018-07-23 DIAGNOSIS — D649 Anemia, unspecified: Secondary | ICD-10-CM | POA: Insufficient documentation

## 2018-07-23 LAB — BPAM RBC
Blood Product Expiration Date: 201912032359
ISSUE DATE / TIME: 201911221027
Unit Type and Rh: 6200

## 2018-07-23 LAB — TYPE AND SCREEN
ABO/RH(D): B POS
ANTIBODY SCREEN: NEGATIVE
Unit division: 0

## 2018-07-23 NOTE — Progress Notes (Signed)
Hematology/Oncology Follow Up Note [facility]  Telephone:([phone] [phone]  Patient Care Team: Tracie Harrier, MD as PCP - General (Internal Medicine)   Name of the patient: Paul Pham  [id number]  October 19, 1925   REASON FOR VISIT  follow-up for management of anemia. HISTORY OF PRESENT ILLNESS Sterling Mondo. is a  84 y.o.  male with PMH listed below who was present as a follow-up after hospital admission for management of anemia. Patient was seen during his most recent admission on 08/18/2017. Patient is heart hearing. He has been feeling increased fatigue, dyspnea, and hemoglobin has dropped from baseline 10 to 7 within the past couple of months. Last colonoscopy was done 10 years ago. Outpatient stool occult test is negative. Patient also takes iron pills and stool is always black.  # He has had extensive workup including urinalysis negative for hematuria, normal P80 and folic level. Normal TSH, negative hemolysis workup. Her stool occult  is being negative as well. 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 Patient and his family members understand that this condition is not curable.   Treatment:  Azacitidine 75 mg/m2  Day 1-5 of every 28 days cycle.  Cycle 1 3/7-3/8, 3/11-13     11/25/2017 1 unit PRBC transfusion cycle 2  12/06/17- 12/10/17, day 1-5   Cycle 3 5/6 -5/10. Cycle 4 6/3-02/04/18 Cycle 5 7/8- 03/11/2018 Cycle 6 8/5- 8/9         02/18/2018 1 unit PRBC transfusion Cycle 7 05/02/2018       04/07/2018 1 unit PRBC transfusion  04/28/2018 1 unit PRBC transfusion Cycle 8 05/30/2018     06/09/2018 1 unit PRBC transfusion Cycle 9 06/27/2018    07/21/2018 1 unit PRBC transfusion  Aranesp 12mg Q 2weeks [11/10/2017-11/25/2017]  Aranesp 3088m weekly   [01/07/2018- 06/23/2018]  switched to Aranesp 30033mQ2 weeks per patient's wife's requests due to cost   INT[place]ans a [age]o. male who has above history reviewed by me today presents for follow-up visit for management of MDS.  Patient has finished 9 cycles of azacitidine. Has been requiring more blood transfusion since August 2019. Fatigue is chronic, not worse.  Patient remains active.  Reports that he is being doing a lot of house chores Chronic shortness of breath with exertion.  At baseline.  Appetite is good. Gained 3 pounds weight since last visit.  Review of Systems  Constitutional: Positive for fatigue. Negative for appetite change, chills, fever and unexpected weight change.  HENT:   Positive for hearing loss. Negative for voice change.   Eyes: Negative for eye problems.  Respiratory: Positive for shortness of breath. Negative for chest tightness and cough.   Cardiovascular: Negative for chest pain and leg swelling.  Gastrointestinal: Negative for abdominal distention and abdominal pain.  Endocrine: Negative for hot flashes.  Genitourinary: Negative for difficulty urinating, dysuria and frequency.   Musculoskeletal: Negative for arthralgias.  Skin: Negative for itching and rash.  Neurological: Negative for extremity weakness, light-headedness and numbness.  Hematological: Negative for adenopathy. Does not bruise/bleed easily.  Psychiatric/Behavioral: Negative for confusion.   No Known Allergies   Past Medical History:  Diagnosis Date  . Anemia   . Arthritis   . BPH (benign prostatic hypertrophy)   . Complication of anesthesia  nausea  . Diabetes type 2, controlled ([place])   . HOH (hard of hearing)    r and L ears-70% loss per pt  . Hyperlipidemia   . Hypertension      Past Surgical History:  Procedure Laterality Date  . APPENDECTOMY    . COLONOSCOPY  09/21/08   1 polyp found, tubular adenoma  . HAND SURGERY    . KNEE SURGERY Left       Social History   Socioeconomic History  . Marital status: Married    Spouse name: Not on file  . Number of children: Not on file  . Years of education: Not on file  . Highest education level: Not on file  Occupational History  . Not on file  Social Needs  . Financial resource strain: Not on file  . Food insecurity:    Worry: Not on file    Inability: Not on file  . Transportation needs:    Medical: Not on file    Non-medical: Not on file  Tobacco Use  . Smoking status: Never Smoker  . Smokeless tobacco: Never Used  Substance and Sexual Activity  . Alcohol use: No    Alcohol/week: 0.0 standard drinks  . Drug use: No  . Sexual activity: Not on file  Lifestyle  . Physical activity:    Days per week: Not on file    Minutes per session: Not on file  . Stress: Not on file  Relationships  . Social connections:    Talks on phone: Not on file    Gets together: Not on file    Attends religious service: Not on file    Active member of club or organization: Not on file    Attends meetings of clubs or organizations: Not on file    Relationship status: Not on file  . Intimate partner violence:    Fear of current or ex partner: Not on file    Emotionally abused: Not on file    Physically abused: Not on file    Forced sexual activity: Not on file  Other Topics Concern  . Not on file  Social History Narrative  . Not on file    Family History  Problem Relation Age of Onset  . Aneurysm Mother   . Heart disease Father   . Cancer Brother        skin  . Heart disease Brother   . Heart disease Brother   . Cancer Sister        skin  . Aneurysm Brother   . Heart disease Brother   . Heart disease Sister   . Heart disease Sister   . COPD Sister   . Diabetes Sister      Current Outpatient Medications:  .  aspirin EC 81 MG tablet, Take 81 mg by mouth daily., Disp: , Rfl:  .  Cranberry (THERACRAN PO), Take by mouth., Disp: , Rfl:  .  cyanocobalamin 1000 MCG tablet,  Take 1,000 mcg by mouth daily., Disp: , Rfl:  .  finasteride (PROSCAR) 5 MG tablet, Take 5 mg by mouth daily., Disp: , Rfl:  .  lisinopril (PRINIVIL,ZESTRIL) 10 MG tablet, Take 10 mg by mouth daily., Disp: , Rfl:  .  loperamide (IMODIUM) 2 MG capsule, Take 1 capsule (2 mg total) by mouth See admin instructions. With onset of loose stool, take 64m followed by 290mevery 2 hours until loose bowel movement stopped. Maximum: 16 mg/day, Disp: 30 capsule, Rfl: 0 .  metformin (  FORTAMET) 1000 MG (OSM) 24 hr tablet, Take 1,000 mg by mouth 2 (two) times daily with a meal., Disp: , Rfl:  .  Multiple Vitamins-Minerals (CENTRUM SILVER PO), Take by mouth., Disp: , Rfl:  .  Omega-3 Fatty Acids (FISH OIL PO), Take by mouth., Disp: , Rfl:  .  ondansetron (ZOFRAN) 4 MG tablet, Take 1 tablet (4 mg total) by mouth every 6 (six) hours as needed for nausea or vomiting., Disp: 30 tablet, Rfl: 0 .  Polyethylene Glycol 3350 (MIRALAX PO), Take by mouth., Disp: , Rfl:  .  Saw Palmetto-Phytosterols (PROSTATE SR PO), Take by mouth., Disp: , Rfl:  .  simvastatin (ZOCOR) 40 MG tablet, Take 40 mg by mouth daily., Disp: , Rfl:   Physical exam:  Vitals:   07/21/18 1124  BP: (!) 128/55  Pulse: 69  Resp: 16  Temp: 97.6 F (36.4 C)  TempSrc: Tympanic  Weight: 181 lb 10.5 oz (82.4 kg)    Physical Exam  Constitutional: He is oriented to person, place, and time. No distress.  HENT:  Head: Normocephalic and atraumatic.  Mouth/Throat: No oropharyngeal exudate.  Eyes: Pupils are equal, round, and reactive to light. EOM are normal. No scleral icterus.  Pale conjunctivae   Neck: Normal range of motion. Neck supple. No JVD present.  Cardiovascular: Normal rate, regular rhythm and normal heart sounds.  No murmur heard. Pulmonary/Chest: Effort normal and breath sounds normal. No respiratory distress. He has no wheezes. He has no rales. He exhibits no tenderness.  Abdominal: Soft. Bowel sounds are normal. He exhibits no distension  and no mass. There is no tenderness. There is no rebound.  Musculoskeletal: Normal range of motion. He exhibits no edema, tenderness or deformity.  Lymphadenopathy:    He has no cervical adenopathy.  Neurological: He is alert and oriented to person, place, and time. He exhibits normal muscle tone.  Skin: Skin is warm and dry. No rash noted. He is not diaphoretic. No erythema. There is pallor.  Psychiatric: Affect and judgment normal.    CMP Latest Ref Rng & Units 06/23/2018  Glucose 70 - 99 mg/dL 186(H)  BUN 8 - 23 mg/dL 18  Creatinine 0.61 - 1.24 mg/dL 0.68  Sodium 135 - 145 mmol/L 136  Potassium 3.5 - 5.1 mmol/L 4.6  Chloride 98 - 111 mmol/L 102  CO2 22 - 32 mmol/L 27  Calcium 8.9 - 10.3 mg/dL 8.6(L)  Total Protein 6.5 - 8.1 g/dL 6.7  Total Bilirubin 0.3 - 1.2 mg/dL 0.8  Alkaline Phos 38 - 126 U/L 23(L)  AST 15 - 41 U/L 19  ALT 0 - 44 U/L 14   CBC Latest Ref Rng & Units 07/21/2018  WBC 4.0 - 10.5 K/uL 3.5(L)  Hemoglobin 13.0 - 17.0 g/dL 6.7(L)  Hematocrit 39.0 - 52.0 % 23.4(L)  Platelets 150 - 400 K/uL 207   Patient's workup including stool occult was negative. UA negative for hematuria. Normal D66 and folic levels. normal TSH. Negative hemolysis workup Inappropriate normal reticulocyte count.   09/22/2017  Bone marrow biopsy Diagnosis Bone Marrow, Aspirate,Biopsy, and Clot, right iliac - HYPERCELLULAR BONE MARROW FOR AGE WITH DYSPOIETIC CHANGES.- SEE COMMENT. PERIPHERAL BLOOD: - NORMOCYTIC-HYPOCHROMIC ANEMIA. - LEUKOPENIA. Diagnosis Note The bone marrow is hypercellular for age with dyspoietic changes variably involving myeloid cell lines, but with main involvement of the granulocytic/monocytic cell line. This is associated with bone marrow monocytosis and borderline number to slight increase in blastic cells. The latter is mainly seen by immunohistochemistry. The overall changes  favor a primary myeloid neoplasm, particularly a myelodysplastic syndrome especially  refractory anemia with excess blasts (RAEB-1) or possibly refractory cytopenia with multilineage dysplasia. Consideration was also given to an evolving myelodysplastic/myeloproliferative neoplasm such as chronic myelomonocytic leukemia but the lack of absolute peripheral monocytosis precludes such a diagnosis at this time. Correlation with cytogenetic studies is recommended. (BNS:ah:ecj 09/24/17) The blastic cells represent 5% of all cells Bone Marrow Flow Cytometry - NO SIGNIFICANT CD34 Gorman one testing positive for  ASXL1 V200VL*94 EZH2 Splice site 446-1J>U RUNX1 A838s*56 IPSS -R 4.5, intermediate group. 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 With median survival 1.6 years.   Assessment and plan Patient is a 84 y.o. 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 1. MDS (myelodysplastic syndrome) ([place])   2. Encounter for antineoplastic chemotherapy   3. 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 Goals of care, counseling/discussion    # Anemia, due to MDS/chemotherapy. Today hemoglobin is 6.7, proceed with 1 unit of PRBC transfusion.  He has got multiple blood transfusions. Will check ferritin level.   # # MDS: Patient tolerates azacitidine 49m/m2 Day 1-5 every 28 days Status post 9 cycles of azacitidine. He has been on Aranesp Q2 weeks.   Recommend repeat bone marrow biopsy for evaluation treatment response.    We spent sufficient time to discuss many aspect of care, questions were answered to patient's satisfaction.  Total face to face encounter time for this patient visit was 265m. >50% of the time was  spent in counseling and coordination of care.   ZhEarlie ServerMD, PhD Hematology Oncology CoNorth Florida Regional [facility] LPt Al[facility] Surgical Partners LLCager- [phone]/23/2019

## 2018-08-01 ENCOUNTER — Ambulatory Visit: Payer: TRICARE For Life (TFL)

## 2018-08-02 ENCOUNTER — Ambulatory Visit: Payer: TRICARE For Life (TFL)

## 2018-08-03 ENCOUNTER — Ambulatory Visit: Payer: TRICARE For Life (TFL)

## 2018-08-04 ENCOUNTER — Ambulatory Visit: Payer: TRICARE For Life (TFL)

## 2018-08-04 ENCOUNTER — Encounter: Payer: Self-pay | Admitting: *Deleted

## 2018-08-05 ENCOUNTER — Ambulatory Visit: Payer: TRICARE For Life (TFL)

## 2018-08-08 ENCOUNTER — Other Ambulatory Visit: Payer: Self-pay

## 2018-08-08 ENCOUNTER — Ambulatory Visit: Payer: TRICARE For Life (TFL)

## 2018-08-08 DIAGNOSIS — D469 Myelodysplastic syndrome, unspecified: Secondary | ICD-10-CM

## 2018-08-10 ENCOUNTER — Other Ambulatory Visit: Payer: Self-pay | Admitting: Physician Assistant

## 2018-08-11 ENCOUNTER — Other Ambulatory Visit: Payer: Self-pay | Admitting: Radiology

## 2018-08-12 ENCOUNTER — Other Ambulatory Visit (HOSPITAL_COMMUNITY)
Admission: RE | Admit: 2018-08-12 | Disposition: A | Discharge status: Home / Self Care | Payer: TRICARE For Life (TFL) | Source: Ambulatory Visit | Attending: Oncology | Admitting: Oncology

## 2018-08-12 ENCOUNTER — Other Ambulatory Visit: Payer: Self-pay

## 2018-08-12 ENCOUNTER — Ambulatory Visit
Admission: RE | Admit: 2018-08-12 | Discharge: 2018-08-12 | Disposition: A | Discharge status: Home / Self Care | Payer: Medicare Other | Source: Ambulatory Visit | Attending: Oncology | Admitting: Oncology

## 2018-08-12 DIAGNOSIS — D72819 Decreased white blood cell count, unspecified: Secondary | ICD-10-CM | POA: Insufficient documentation

## 2018-08-12 DIAGNOSIS — Z79899 Other long term (current) drug therapy: Secondary | ICD-10-CM | POA: Insufficient documentation

## 2018-08-12 DIAGNOSIS — E119 Type 2 diabetes mellitus without complications: Secondary | ICD-10-CM | POA: Insufficient documentation

## 2018-08-12 DIAGNOSIS — Z7982 Long term (current) use of aspirin: Secondary | ICD-10-CM | POA: Insufficient documentation

## 2018-08-12 DIAGNOSIS — D649 Anemia, unspecified: Secondary | ICD-10-CM | POA: Insufficient documentation

## 2018-08-12 DIAGNOSIS — D469 Myelodysplastic syndrome, unspecified: Secondary | ICD-10-CM

## 2018-08-12 DIAGNOSIS — Z7984 Long term (current) use of oral hypoglycemic drugs: Secondary | ICD-10-CM | POA: Diagnosis not present

## 2018-08-12 LAB — CBC WITH DIFFERENTIAL/PLATELET
Abs Immature Granulocytes: 0.09 10*3/uL — ABNORMAL HIGH (ref 0.00–0.07)
BASOS ABS: 0 10*3/uL (ref 0.0–0.1)
Basophils Relative: 0 %
Eosinophils Absolute: 0 10*3/uL (ref 0.0–0.5)
Eosinophils Relative: 0 %
HEMATOCRIT: 26.9 % — AB (ref 39.0–52.0)
Hemoglobin: 7.5 g/dL — ABNORMAL LOW (ref 13.0–17.0)
Immature Granulocytes: 3 %
Lymphocytes Relative: 34 %
Lymphs Abs: 1.1 10*3/uL (ref 0.7–4.0)
MCH: 25.4 pg — ABNORMAL LOW (ref 26.0–34.0)
MCHC: 27.9 g/dL — ABNORMAL LOW (ref 30.0–36.0)
MCV: 91.2 fL (ref 80.0–100.0)
Monocytes Absolute: 0.5 10*3/uL (ref 0.1–1.0)
Monocytes Relative: 17 %
NEUTROS PCT: 46 %
Neutro Abs: 1.4 10*3/uL — ABNORMAL LOW (ref 1.7–7.7)
Platelets: 241 10*3/uL (ref 150–400)
RBC: 2.95 MIL/uL — ABNORMAL LOW (ref 4.22–5.81)
RDW: 17.2 % — ABNORMAL HIGH (ref 11.5–15.5)
WBC: 3.1 10*3/uL — ABNORMAL LOW (ref 4.0–10.5)
nRBC: 0 % (ref 0.0–0.2)

## 2018-08-12 LAB — GLUCOSE, CAPILLARY
Glucose-Capillary: 208 mg/dL — ABNORMAL HIGH (ref 70–99)
Glucose-Capillary: 205 mg/dL — ABNORMAL HIGH (ref 70–99)

## 2018-08-12 LAB — PROTIME-INR
INR: 1.16
Prothrombin Time: 14.7 seconds (ref 11.4–15.2)

## 2018-08-12 LAB — APTT: aPTT: 46 seconds — ABNORMAL HIGH (ref 24–36)

## 2018-08-12 MED ORDER — MIDAZOLAM HCL 2 MG/2ML IJ SOLN
INTRAMUSCULAR | Status: AC | PRN
  Administered 2018-08-12: 1 mg via INTRAVENOUS

## 2018-08-12 MED ORDER — FENTANYL CITRATE (PF) 100 MCG/2ML IJ SOLN
INTRAMUSCULAR | Status: AC
  Filled 2018-08-12: qty 4

## 2018-08-12 MED ORDER — HYDROCODONE-ACETAMINOPHEN 5-325 MG PO TABS
1.0000 | ORAL_TABLET | ORAL | Status: DC | PRN
  Filled 2018-08-12: qty 2

## 2018-08-12 MED ORDER — HEPARIN SOD (PORK) LOCK FLUSH 100 UNIT/ML IV SOLN
INTRAVENOUS | Status: AC
  Filled 2018-08-12: qty 5

## 2018-08-12 MED ORDER — SODIUM CHLORIDE 0.9 % IV SOLN
INTRAVENOUS | Status: DC
  Administered 2018-08-12: 08:00:00 via INTRAVENOUS

## 2018-08-12 MED ORDER — FENTANYL CITRATE (PF) 100 MCG/2ML IJ SOLN
INTRAMUSCULAR | Status: AC | PRN
  Administered 2018-08-12 (×2): 50 ug via INTRAVENOUS

## 2018-08-12 MED ORDER — MIDAZOLAM HCL 2 MG/2ML IJ SOLN
INTRAMUSCULAR | Status: AC
  Filled 2018-08-12: qty 2

## 2018-08-12 NOTE — Procedures (Signed)
  Procedure: CT R iliac bone marrow biopsy    EBL:   minimal Complications:  none immediate  See full dictation in [facility] PACS.  D. Natara Monfort MD Main # [phone] Pager  [phone]    

## 2018-08-17 ENCOUNTER — Other Ambulatory Visit: Payer: Self-pay

## 2018-08-17 DIAGNOSIS — D509 Iron deficiency anemia, unspecified: Secondary | ICD-10-CM

## 2018-08-17 DIAGNOSIS — D469 Myelodysplastic syndrome, unspecified: Secondary | ICD-10-CM

## 2018-08-18 ENCOUNTER — Other Ambulatory Visit: Payer: Self-pay

## 2018-08-18 ENCOUNTER — Inpatient Hospital Stay: Payer: Medicare Other

## 2018-08-18 ENCOUNTER — Other Ambulatory Visit: Payer: TRICARE For Life (TFL)

## 2018-08-18 ENCOUNTER — Inpatient Hospital Stay: Payer: Medicare Other | Attending: Oncology | Admitting: Oncology

## 2018-08-18 ENCOUNTER — Encounter: Payer: Self-pay | Admitting: Oncology

## 2018-08-18 ENCOUNTER — Ambulatory Visit: Payer: TRICARE For Life (TFL)

## 2018-08-18 ENCOUNTER — Other Ambulatory Visit: Payer: Self-pay | Admitting: *Deleted

## 2018-08-18 ENCOUNTER — Other Ambulatory Visit: Payer: Self-pay | Admitting: Oncology

## 2018-08-18 VITALS — BP 135/49 | HR 69 | Temp 96.9°F | Resp 18 | Wt 184.1 lb

## 2018-08-18 DIAGNOSIS — D4621 Refractory anemia with excess of blasts 1: Secondary | ICD-10-CM | POA: Insufficient documentation

## 2018-08-18 DIAGNOSIS — D469 Myelodysplastic syndrome, unspecified: Secondary | ICD-10-CM

## 2018-08-18 DIAGNOSIS — T451X5A Adverse effect of antineoplastic and immunosuppressive drugs, initial encounter: Secondary | ICD-10-CM | POA: Diagnosis not present

## 2018-08-18 DIAGNOSIS — D6481 Anemia due to antineoplastic chemotherapy: Secondary | ICD-10-CM | POA: Diagnosis not present

## 2018-08-18 DIAGNOSIS — D649 Anemia, unspecified: Secondary | ICD-10-CM

## 2018-08-18 DIAGNOSIS — Z79899 Other long term (current) drug therapy: Secondary | ICD-10-CM | POA: Diagnosis not present

## 2018-08-18 DIAGNOSIS — Z5111 Encounter for antineoplastic chemotherapy: Secondary | ICD-10-CM

## 2018-08-18 LAB — CBC WITH DIFFERENTIAL/PLATELET
Abs Immature Granulocytes: 0.08 10*3/uL — ABNORMAL HIGH (ref 0.00–0.07)
Basophils Absolute: 0 10*3/uL (ref 0.0–0.1)
Basophils Relative: 0 %
EOS PCT: 1 %
Eosinophils Absolute: 0 10*3/uL (ref 0.0–0.5)
HCT: 23.3 % — ABNORMAL LOW (ref 39.0–52.0)
Hemoglobin: 6.7 g/dL — ABNORMAL LOW (ref 13.0–17.0)
Immature Granulocytes: 2 %
Lymphocytes Relative: 34 %
Lymphs Abs: 1.4 10*3/uL (ref 0.7–4.0)
MCH: 26.1 pg (ref 26.0–34.0)
MCHC: 28.8 g/dL — ABNORMAL LOW (ref 30.0–36.0)
MCV: 90.7 fL (ref 80.0–100.0)
Monocytes Absolute: 0.7 10*3/uL (ref 0.1–1.0)
Monocytes Relative: 18 %
NRBC: 0 % (ref 0.0–0.2)
Neutro Abs: 1.9 10*3/uL (ref 1.7–7.7)
Neutrophils Relative %: 45 %
Platelets: 223 10*3/uL (ref 150–400)
RBC: 2.57 MIL/uL — ABNORMAL LOW (ref 4.22–5.81)
RDW: 17.9 % — ABNORMAL HIGH (ref 11.5–15.5)
WBC: 4.1 10*3/uL (ref 4.0–10.5)

## 2018-08-18 LAB — COMPREHENSIVE METABOLIC PANEL
ALT: 14 U/L (ref 0–44)
AST: 18 U/L (ref 15–41)
Albumin: 4 g/dL (ref 3.5–5.0)
Alkaline Phosphatase: 26 U/L — ABNORMAL LOW (ref 38–126)
Anion gap: 6 (ref 5–15)
BILIRUBIN TOTAL: 1 mg/dL (ref 0.3–1.2)
BUN: 21 mg/dL (ref 8–23)
CO2: 27 mmol/L (ref 22–32)
Calcium: 8.6 mg/dL — ABNORMAL LOW (ref 8.9–10.3)
Chloride: 102 mmol/L (ref 98–111)
Creatinine, Ser: 0.75 mg/dL (ref 0.61–1.24)
GFR calc Af Amer: >60 mL/min (ref >60)
GFR calc non Af Amer: >60 mL/min (ref >60)
Glucose, Bld: 228 mg/dL — ABNORMAL HIGH (ref 70–99)
POTASSIUM: 4.5 mmol/L (ref 3.5–5.1)
Sodium: 135 mmol/L (ref 135–145)
TOTAL PROTEIN: 6.9 g/dL (ref 6.5–8.1)

## 2018-08-18 LAB — SAMPLE TO BLOOD BANK

## 2018-08-18 LAB — PREPARE RBC (CROSSMATCH)

## 2018-08-18 MED ORDER — DARBEPOETIN ALFA 300 MCG/0.6ML IJ SOSY
300.0000 ug | PREFILLED_SYRINGE | Freq: Once | INTRAMUSCULAR | Status: AC
  Administered 2018-08-18: 300 ug via SUBCUTANEOUS

## 2018-08-18 NOTE — Progress Notes (Addendum)
Hematology/Oncology Follow Up Note [facility]  Telephone:([phone] Fax:([phone]  Patient Care Team: Tracie Harrier, MD as PCP - General (Internal Medicine)   Name of the patient: Paul Pham  [id number]  08-02-1926   REASON FOR VISIT  follow-up for management of anemia. HISTORY OF PRESENT ILLNESS Paul Pham. is a  84 y.o.  male with PMH listed below who was present as a follow-up after hospital admission for management of anemia. Patient was seen during his most recent admission on 08/18/2017. Patient is heart hearing. He has been feeling increased fatigue, dyspnea, and hemoglobin has dropped from baseline 10 to 7 within the past couple of months. Last colonoscopy was done 10 years ago. Outpatient stool occult test is negative. Patient also takes iron pills and stool is always black.  # He has had extensive workup including urinalysis negative for hematuria, normal W10 and folic level. Normal TSH, negative hemolysis workup, negative stool occult. Inappropriate normal reticulocyte count. CT abdomen pelvis reveals no acute process  # 09/22/2017  BM biopsy showed  hypercellular bone marrow for age with dyspoietic changes, blast 5%, differential includes RAEB-1 vs CMML, favor RAEB-1 Foundation one testing positive for  ASXL1 X323FT*73 EZH2 Splice site 220-2R>K RUNX1 A838s*56 IPSS -R 4.5, intermediate group. 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 With median survival 1.6 years.   #Goal of care was discussed, is palliative. Patient and his family members understand that this condition is not curable.   Treatment:  Azacitidine 75 mg/m2  Day 1-5 of every 28 days cycle.  Cycle 1 3/7-3/8, 3/11-13     11/25/2017 1 unit PRBC transfusion cycle 2  12/06/17- 12/10/17, day 1-5   Cycle 3 5/6 -5/10. Cycle 4 6/3-02/04/18 Cycle 5 7/8- 03/11/2018 Cycle 6 8/5- 8/9          02/18/2018 1 unit PRBC transfusion Cycle 7 05/02/2018       04/07/2018 1 unit PRBC transfusion  04/28/2018 1 unit PRBC transfusion Cycle 8 05/30/2018     06/09/2018 1 unit PRBC transfusion Cycle 9 06/27/2018    07/21/2018 1 unit PRBC transfusion  Aranesp 133mg Q 2weeks [11/10/2017-11/25/2017]  Aranesp 3082m weekly  [01/07/2018- 06/23/2018]  switched to Aranesp 30034mQ2 weeks per patient's wife's requests due to cost   INTSekius a [age]o. male who has above history reviewed by me today presents for follow-up visit for management of MDS.  Patient has finished 9 cycles of azacitidine. Requiring more blood transfusion since August 2019. Fatigue is chronic, at baseline.  He remains active.  Doing a lot of house chores. Chronic shortness of breath with exertion, at baseline.  Appetite is good. During interval, a bone marrow biopsy [08/11/2018] was obtained to assess treatment response. Today he is accompanied by wife, and 2 sons. Review of Systems  Constitutional: Positive for fatigue. Negative for appetite change, chills, fever and unexpected weight change.  HENT:   Positive for hearing loss. Negative for voice change.   Eyes: Negative for eye problems and icterus.  Respiratory: Positive for shortness of breath. Negative for chest tightness and cough.   Cardiovascular: Negative for chest pain and leg swelling.  Gastrointestinal: Negative for abdominal distention and abdominal pain.  Endocrine: Negative for hot flashes.  Genitourinary: Negative for difficulty urinating, dysuria and frequency.   Musculoskeletal: Negative for arthralgias.  Skin: Negative for itching and rash.  Neurological: Negative for extremity weakness, light-headedness and numbness.  Hematological: Negative for adenopathy. Does not bruise/bleed easily.  Psychiatric/Behavioral: Negative for confusion.   No Known Allergies   Past Medical History:  Diagnosis Date  . Anemia   . Arthritis   . BPH  (benign prostatic hypertrophy)   . Complication of anesthesia    nausea  . Diabetes type 2, controlled ([place])   . HOH (hard of hearing)    r and L ears-70% loss per pt  . Hyperlipidemia   . Hypertension      Past Surgical History:  Procedure Laterality Date  . APPENDECTOMY    . COLONOSCOPY  09/21/08   1 polyp found, tubular adenoma  . HAND SURGERY    . KNEE SURGERY Left     Social History   Socioeconomic History  . Marital status: Married    Spouse name: Not on file  . Number of children: Not on file  . Years of education: Not on file  . Highest education level: Not on file  Occupational History  . Not on file  Social Needs  . Financial resource strain: Not on file  . Food insecurity:    Worry: Not on file    Inability: Not on file  . Transportation needs:    Medical: Not on file    Non-medical: Not on file  Tobacco Use  . Smoking status: Never Smoker  . Smokeless tobacco: Never Used  Substance and Sexual Activity  . Alcohol use: No    Alcohol/week: 0.0 standard drinks  . Drug use: No  . Sexual activity: Not on file  Lifestyle  . Physical activity:    Days per week: Not on file    Minutes per session: Not on file  . Stress: Not on file  Relationships  . Social connections:    Talks on phone: Not on file    Gets together: Not on file    Attends religious service: Not on file    Active member of club or organization: Not on file    Attends meetings of clubs or organizations: Not on file    Relationship status: Not on file  . Intimate partner violence:    Fear of current or ex partner: Not on file    Emotionally abused: Not on file    Physically abused: Not on file    Forced sexual activity: Not on file  Other Topics Concern  . Not on file  Social History Narrative  . Not on file    Family History  Problem Relation Age of Onset  . Aneurysm Mother   . Heart disease Father   . Cancer Brother        skin  . Heart disease Brother   . Heart disease  Brother   . Cancer Sister        skin  . Aneurysm Brother   . Heart disease Brother   . Heart disease Sister   . Heart disease Sister   . COPD Sister   . Diabetes Sister      Current Outpatient Medications:  .  aspirin EC 81 MG tablet, Take 81 mg by mouth daily., Disp: , Rfl:  .  Cranberry (THERACRAN PO), Take by mouth., Disp: , Rfl:  .  cyanocobalamin 1000 MCG tablet, Take 1,000 mcg by mouth daily., Disp: , Rfl:  .  finasteride (PROSCAR) 5 MG tablet, Take 5 mg by mouth daily., Disp: , Rfl:  .  lisinopril (PRINIVIL,ZESTRIL) 10 MG tablet,  Take 10 mg by mouth daily., Disp: , Rfl:  .  loperamide (IMODIUM) 2 MG capsule, Take 1 capsule (2 mg total) by mouth See admin instructions. With onset of loose stool, take 80m followed by 257mevery 2 hours until loose bowel movement stopped. Maximum: 16 mg/day, Disp: 30 capsule, Rfl: 0 .  metformin (FORTAMET) 1000 MG (OSM) 24 hr tablet, Take 1,000 mg by mouth 2 (two) times daily with a meal., Disp: , Rfl:  .  Multiple Vitamins-Minerals (CENTRUM SILVER PO), Take by mouth., Disp: , Rfl:  .  Omega-3 Fatty Acids (FISH OIL PO), Take by mouth., Disp: , Rfl:  .  ondansetron (ZOFRAN) 4 MG tablet, Take 1 tablet (4 mg total) by mouth every 6 (six) hours as needed for nausea or vomiting., Disp: 30 tablet, Rfl: 0 .  Polyethylene Glycol 3350 (MIRALAX PO), Take by mouth., Disp: , Rfl:  .  Saw Palmetto-Phytosterols (PROSTATE SR PO), Take by mouth., Disp: , Rfl:  .  simvastatin (ZOCOR) 40 MG tablet, Take 40 mg by mouth daily., Disp: , Rfl:   Physical exam:  Vitals:   08/18/18 0935  BP: (!) 135/49  Pulse: 69  Resp: 18  Temp: (!) 96.9 F (36.1 C)  TempSrc: Tympanic  Weight: 184 lb 1.4 oz (83.5 kg)    Physical Exam  Constitutional: He is oriented to person, place, and time. No distress.  HENT:  Head: Normocephalic and atraumatic.  Mouth/Throat: No oropharyngeal exudate.  Eyes: Pupils are equal, round, and reactive to light. EOM are normal. No scleral  icterus.  Pale conjunctivae   Neck: Normal range of motion. Neck supple. No JVD present.  Cardiovascular: Normal rate, regular rhythm and normal heart sounds.  No murmur heard. Pulmonary/Chest: Effort normal. No respiratory distress. He has no wheezes.  Abdominal: Soft. Bowel sounds are normal. He exhibits no distension and no mass. There is no abdominal tenderness. There is no rebound.  Musculoskeletal: Normal range of motion.        General: No tenderness, deformity or edema.  Lymphadenopathy:    He has no cervical adenopathy.  Neurological: He is alert and oriented to person, place, and time. No cranial nerve deficit. He exhibits normal muscle tone. Coordination normal.  Skin: Skin is warm and dry. No rash noted. He is not diaphoretic. No erythema. There is pallor.  Psychiatric: Affect and judgment normal.    CMP Latest Ref Rng & Units 08/18/2018  Glucose 70 - 99 mg/dL 228(H)  BUN 8 - 23 mg/dL 21  Creatinine 0.61 - 1.24 mg/dL 0.75  Sodium 135 - 145 mmol/L 135  Potassium 3.5 - 5.1 mmol/L 4.5  Chloride 98 - 111 mmol/L 102  CO2 22 - 32 mmol/L 27  Calcium 8.9 - 10.3 mg/dL 8.6(L)  Total Protein 6.5 - 8.1 g/dL 6.9  Total Bilirubin 0.3 - 1.2 mg/dL 1.0  Alkaline Phos 38 - 126 U/L 26(L)  AST 15 - 41 U/L 18  ALT 0 - 44 U/L 14   CBC Latest Ref Rng & Units 08/18/2018  WBC 4.0 - 10.5 K/uL 4.1  Hemoglobin 13.0 - 17.0 g/dL 6.7(L)  Hematocrit 39.0 - 52.0 % 23.3(L)  Platelets 150 - 400 K/uL 223      Assessment and plan Patient is a [age].o. male presents with MDS,RAEB-1, cytogenetics normal, high risk gene mutations. 1. [place]  2. Encounter for antineoplastic chemotherapy   3. Symptomatic anemia   4. Antineoplastic chemotherapy induced anemia    #Symptomatic anemia, due  to MDS/chemotherapy.  Today's hemoglobin is 6.7, proceed with 1 unit of PRBC transfusion tomorrow. History of about 10 units of PRBC transfusion Last iron panel check on 05/30/2018  showed ferritin of 144 and iron saturation 43.  Check iron panel at next visit.   # # MDS: Patient tolerates azacitidine 8m/m2 Day 1-5 every 28 days, status post 9 cycles 08-12-2018-Bone marrow biopsy pathology was reviewed and discussed in detail with patient. Similar appearance of bone marrow morphology, 5% plasma cells to previous bone marrow biopsy, slightly higher cellularity Stable disease.  Proceed with cycle 10 azacitidine D1-5, starting 08/29/2017 Proceed with Aranesp 3047m every 2 weeks.  We spent sufficient time to discuss many aspect of care, questions were answered to patient's satisfaction. Follow up: 08/22/2018 check cbc, CMP,  Proceed with Cycle 10 Azacitadine 09/01/2018 Lab MD Aranesp    Total face to face encounter time for this patient visit was 25 min. >50% of the time was  spent in counseling and coordination of care.  ZhEarlie ServerMD, PhD Hematology Oncology CoMount Grant General Hospitalt AlHebrew Rehabilitation Center At Dedhamager- [phone]/19/2019

## 2018-08-18 NOTE — Progress Notes (Signed)
Patient here for follow up. No concerns voiced.  °

## 2018-08-19 ENCOUNTER — Inpatient Hospital Stay: Payer: Medicare Other

## 2018-08-19 ENCOUNTER — Ambulatory Visit: Payer: TRICARE For Life (TFL)

## 2018-08-19 DIAGNOSIS — D4621 Refractory anemia with excess of blasts 1: Secondary | ICD-10-CM | POA: Diagnosis not present

## 2018-08-19 DIAGNOSIS — D649 Anemia, unspecified: Secondary | ICD-10-CM

## 2018-08-19 DIAGNOSIS — D469 Myelodysplastic syndrome, unspecified: Secondary | ICD-10-CM

## 2018-08-19 MED ORDER — DIPHENHYDRAMINE HCL 25 MG PO CAPS
25.0000 mg | ORAL_CAPSULE | Freq: Once | ORAL | Status: AC
  Administered 2018-08-19: 25 mg via ORAL
  Filled 2018-08-19: qty 1

## 2018-08-19 MED ORDER — ACETAMINOPHEN 325 MG PO TABS
650.0000 mg | ORAL_TABLET | Freq: Once | ORAL | Status: AC
  Administered 2018-08-19: 650 mg via ORAL
  Filled 2018-08-19: qty 2

## 2018-08-19 MED ORDER — SODIUM CHLORIDE 0.9% IV SOLUTION
250.0000 mL | Freq: Once | INTRAVENOUS | Status: AC
  Administered 2018-08-19: 250 mL via INTRAVENOUS
  Filled 2018-08-19: qty 250

## 2018-08-19 NOTE — Patient Instructions (Signed)
Blood Transfusion, Adult, Care After This sheet gives you information about how to care for yourself after your procedure. Your doctor may also give you more specific instructions. If you have problems or questions, contact your doctor. Follow these instructions at home:   Take over-the-counter and prescription medicines only as told by your doctor.  Go back to your normal activities as told by your doctor.  Follow instructions from your doctor about how to take care of the area where an IV tube was put into your vein (insertion site). Make sure you: ? Wash your hands with soap and water before you change your bandage (dressing). If there is no soap and water, use hand sanitizer. ? Change your bandage as told by your doctor.  Check your IV insertion site every day for signs of infection. Check for: ? More redness, swelling, or pain. ? More fluid or blood. ? Warmth. ? Pus or a bad smell. Contact a doctor if:  You have more redness, swelling, or pain around the IV insertion site.  You have more fluid or blood coming from the IV insertion site.  Your IV insertion site feels warm to the touch.  You have pus or a bad smell coming from the IV insertion site.  Your pee (urine) turns pink, red, or brown.  You feel weak after doing your normal activities. Get help right away if:  You have signs of a serious allergic or body defense (immune) system reaction, including: ? Itchiness. ? Hives. ? Trouble breathing. ? Anxiety. ? Pain in your chest or lower back. ? Fever, flushing, and chills. ? Fast pulse. ? Rash. ? Watery poop (diarrhea). ? Throwing up (vomiting). ? Dark pee. ? Serious headache. ? Dizziness. ? Stiff neck. ? Yellow color in your face or the white parts of your eyes (jaundice). Summary  After a blood transfusion, return to your normal activities as told by your doctor.  Every day, check for signs of infection where the IV tube was put into your vein.  Some  signs of infection are warm skin, more redness and pain, more fluid or blood, and pus or a bad smell where the needle went in.  Contact your doctor if you feel weak or have any unusual symptoms. This information is not intended to replace advice given to you by your health care provider. Make sure you discuss any questions you have with your health care provider. Document Released: 09/07/2014 Document Revised: 04/10/2016 Document Reviewed: 04/10/2016 Elsevier Interactive Patient Education  2019 Elsevier Inc.  

## 2018-08-20 LAB — TYPE AND SCREEN
ABO/RH(D): B POS
ANTIBODY SCREEN: NEGATIVE
Unit division: 0

## 2018-08-20 LAB — BPAM RBC
BLOOD PRODUCT EXPIRATION DATE: 201912272359
ISSUE DATE / TIME: 201912201022
Unit Type and Rh: 5100

## 2018-08-22 ENCOUNTER — Inpatient Hospital Stay: Payer: Medicare Other

## 2018-08-22 VITALS — BP 135/61 | HR 76 | Temp 97.5°F | Resp 18

## 2018-08-22 DIAGNOSIS — D469 Myelodysplastic syndrome, unspecified: Secondary | ICD-10-CM

## 2018-08-22 DIAGNOSIS — T451X5A Adverse effect of antineoplastic and immunosuppressive drugs, initial encounter: Secondary | ICD-10-CM

## 2018-08-22 DIAGNOSIS — D649 Anemia, unspecified: Secondary | ICD-10-CM

## 2018-08-22 DIAGNOSIS — D6481 Anemia due to antineoplastic chemotherapy: Secondary | ICD-10-CM

## 2018-08-22 DIAGNOSIS — D4621 Refractory anemia with excess of blasts 1: Secondary | ICD-10-CM | POA: Diagnosis not present

## 2018-08-22 DIAGNOSIS — Z5111 Encounter for antineoplastic chemotherapy: Secondary | ICD-10-CM

## 2018-08-22 LAB — COMPREHENSIVE METABOLIC PANEL
ALT: 15 U/L (ref 0–44)
AST: 18 U/L (ref 15–41)
Albumin: 4.1 g/dL (ref 3.5–5.0)
Alkaline Phosphatase: 31 U/L — ABNORMAL LOW (ref 38–126)
Anion gap: 6 (ref 5–15)
BUN: 22 mg/dL (ref 8–23)
CO2: 27 mmol/L (ref 22–32)
Calcium: 8.6 mg/dL — ABNORMAL LOW (ref 8.9–10.3)
Chloride: 102 mmol/L (ref 98–111)
Creatinine, Ser: 0.7 mg/dL (ref 0.61–1.24)
GFR calc Af Amer: >60 mL/min (ref >60)
GFR calc non Af Amer: >60 mL/min (ref >60)
GLUCOSE: 202 mg/dL — AB (ref 70–99)
Potassium: 4.4 mmol/L (ref 3.5–5.1)
Sodium: 135 mmol/L (ref 135–145)
Total Bilirubin: 0.8 mg/dL (ref 0.3–1.2)
Total Protein: 6.9 g/dL (ref 6.5–8.1)

## 2018-08-22 LAB — CBC WITH DIFFERENTIAL/PLATELET
Abs Immature Granulocytes: 0.07 10*3/uL (ref 0.00–0.07)
Basophils Absolute: 0 10*3/uL (ref 0.0–0.1)
Basophils Relative: 0 %
Eosinophils Absolute: 0 10*3/uL (ref 0.0–0.5)
Eosinophils Relative: 1 %
HCT: 25.3 % — ABNORMAL LOW (ref 39.0–52.0)
Hemoglobin: 7.4 g/dL — ABNORMAL LOW (ref 13.0–17.0)
Immature Granulocytes: 2 %
LYMPHS PCT: 29 %
Lymphs Abs: 1.2 10*3/uL (ref 0.7–4.0)
MCH: 26.4 pg (ref 26.0–34.0)
MCHC: 29.2 g/dL — AB (ref 30.0–36.0)
MCV: 90.4 fL (ref 80.0–100.0)
Monocytes Absolute: 0.8 10*3/uL (ref 0.1–1.0)
Monocytes Relative: 19 %
Neutro Abs: 2 10*3/uL (ref 1.7–7.7)
Neutrophils Relative %: 49 %
Platelets: 212 10*3/uL (ref 150–400)
RBC: 2.8 MIL/uL — ABNORMAL LOW (ref 4.22–5.81)
RDW: 17.9 % — ABNORMAL HIGH (ref 11.5–15.5)
WBC: 4 10*3/uL (ref 4.0–10.5)
nRBC: 0.5 % — ABNORMAL HIGH (ref 0.0–0.2)

## 2018-08-22 MED ORDER — AZACITIDINE CHEMO SQ INJECTION
75.0000 mg/m2 | Freq: Once | INTRAMUSCULAR | Status: AC
  Administered 2018-08-22: 155 mg via SUBCUTANEOUS
  Filled 2018-08-22: qty 6.2

## 2018-08-22 MED ORDER — ONDANSETRON HCL 4 MG PO TABS
8.0000 mg | ORAL_TABLET | Freq: Once | ORAL | Status: AC
  Administered 2018-08-22: 8 mg via ORAL
  Filled 2018-08-22: qty 2

## 2018-08-23 ENCOUNTER — Inpatient Hospital Stay: Payer: Medicare Other

## 2018-08-23 VITALS — BP 130/53 | HR 75 | Temp 97.7°F | Resp 18

## 2018-08-23 DIAGNOSIS — D469 Myelodysplastic syndrome, unspecified: Secondary | ICD-10-CM

## 2018-08-23 DIAGNOSIS — D4621 Refractory anemia with excess of blasts 1: Secondary | ICD-10-CM | POA: Diagnosis not present

## 2018-08-23 LAB — SAMPLE TO BLOOD BANK

## 2018-08-23 MED ORDER — ONDANSETRON HCL 4 MG PO TABS
8.0000 mg | ORAL_TABLET | Freq: Once | ORAL | Status: AC
  Administered 2018-08-23: 8 mg via ORAL
  Filled 2018-08-23: qty 2

## 2018-08-23 MED ORDER — AZACITIDINE CHEMO SQ INJECTION
75.0000 mg/m2 | Freq: Once | INTRAMUSCULAR | Status: AC
  Administered 2018-08-23: 155 mg via SUBCUTANEOUS
  Filled 2018-08-23: qty 6.2

## 2018-08-25 ENCOUNTER — Inpatient Hospital Stay: Payer: Medicare Other

## 2018-08-25 ENCOUNTER — Encounter (HOSPITAL_COMMUNITY): Payer: Self-pay | Admitting: Hematology

## 2018-08-25 VITALS — BP 132/57 | HR 73 | Temp 97.1°F | Resp 18

## 2018-08-25 DIAGNOSIS — D4621 Refractory anemia with excess of blasts 1: Secondary | ICD-10-CM | POA: Diagnosis not present

## 2018-08-25 DIAGNOSIS — D469 Myelodysplastic syndrome, unspecified: Secondary | ICD-10-CM

## 2018-08-25 MED ORDER — ONDANSETRON HCL 4 MG PO TABS
8.0000 mg | ORAL_TABLET | Freq: Once | ORAL | Status: AC
  Administered 2018-08-25: 8 mg via ORAL
  Filled 2018-08-25: qty 2

## 2018-08-25 MED ORDER — AZACITIDINE CHEMO SQ INJECTION
75.0000 mg/m2 | Freq: Once | INTRAMUSCULAR | Status: AC
  Administered 2018-08-25: 155 mg via SUBCUTANEOUS
  Filled 2018-08-25: qty 6.2

## 2018-08-25 NOTE — Patient Instructions (Signed)
Azacitidine suspension for injection (subcutaneous use)  What is this medicine?  AZACITIDINE (ay za SITE i deen) is a chemotherapy drug. This medicine reduces the growth of cancer cells and can suppress the immune system. It is used for treating myelodysplastic syndrome or some types of leukemia.  This medicine may be used for other purposes; ask your health care provider or pharmacist if you have questions.  COMMON BRAND NAME(S): Vidaza  What should I tell my health care provider before I take this medicine?  They need to know if you have any of these conditions:  -kidney disease  -liver disease  -liver tumors  -an unusual or allergic reaction to azacitidine, mannitol, other medicines, foods, dyes, or preservatives  -pregnant or trying to get pregnant  -breast-feeding  How should I use this medicine?  This medicine is for injection under the skin. It is administered in a hospital or clinic by a specially trained health care professional.  Talk to your pediatrician regarding the use of this medicine in children. While this drug may be prescribed for selected conditions, precautions do apply.  Overdosage: If you think you have taken too much of this medicine contact a poison control center or emergency room at once.  NOTE: This medicine is only for you. Do not share this medicine with others.  What if I miss a dose?  It is important not to miss your dose. Call your doctor or health care professional if you are unable to keep an appointment.  What may interact with this medicine?  Interactions have not been studied.  Give your health care provider a list of all the medicines, herbs, non-prescription drugs, or dietary supplements you use. Also tell them if you smoke, drink alcohol, or use illegal drugs. Some items may interact with your medicine.  This list may not describe all possible interactions. Give your health care provider a list of all the medicines, herbs, non-prescription drugs, or dietary supplements you  use. Also tell them if you smoke, drink alcohol, or use illegal drugs. Some items may interact with your medicine.  What should I watch for while using this medicine?  Visit your doctor for checks on your progress. This drug may make you feel generally unwell. This is not uncommon, as chemotherapy can affect healthy cells as well as cancer cells. Report any side effects. Continue your course of treatment even though you feel ill unless your doctor tells you to stop.  In some cases, you may be given additional medicines to help with side effects. Follow all directions for their use.  Call your doctor or health care professional for advice if you get a fever, chills or sore throat, or other symptoms of a cold or flu. Do not treat yourself. This drug decreases your body's ability to fight infections. Try to avoid being around people who are sick.  This medicine may increase your risk to bruise or bleed. Call your doctor or health care professional if you notice any unusual bleeding.  You may need blood work done while you are taking this medicine.  Do not become pregnant while taking this medicine and for 6 months after the last dose. Women should inform their doctor if they wish to become pregnant or think they might be pregnant. Men should not father a child while taking this medicine and for 3 months after the last dose. There is a potential for serious side effects to an unborn child. Talk to your health care professional or pharmacist for   more information. Do not breast-feed an infant while taking this medicine and for 1 week after the last dose.  This medicine may interfere with the ability to have a child. Talk with your doctor or health care professional if you are concerned about your fertility.  What side effects may I notice from receiving this medicine?  Side effects that you should report to your doctor or health care professional as soon as possible:  -allergic reactions like skin rash, itching or hives,  swelling of the face, lips, or tongue  -low blood counts - this medicine may decrease the number of white blood cells, red blood cells and platelets. You may be at increased risk for infections and bleeding.  -signs of infection - fever or chills, cough, sore throat, pain passing urine  -signs of decreased platelets or bleeding - bruising, pinpoint red spots on the skin, black, tarry stools, blood in the urine  -signs of decreased red blood cells - unusually weak or tired, fainting spells, lightheadedness  -signs and symptoms of kidney injury like trouble passing urine or change in the amount of urine  -signs and symptoms of liver injury like dark yellow or brown urine; general ill feeling or flu-like symptoms; light-colored stools; loss of appetite; nausea; right upper belly pain; unusually weak or tired; yellowing of the eyes or skin  Side effects that usually do not require medical attention (report to your doctor or health care professional if they continue or are bothersome):  -constipation  -diarrhea  -nausea, vomiting  -pain or redness at the injection site  -unusually weak or tired  This list may not describe all possible side effects. Call your doctor for medical advice about side effects. You may report side effects to FDA at 1-800-FDA-1088.  Where should I keep my medicine?  This drug is given in a hospital or clinic and will not be stored at home.  NOTE: This sheet is a summary. It may not cover all possible information. If you have questions about this medicine, talk to your doctor, pharmacist, or health care provider.   2019 Elsevier/Gold Standard (2016-09-15 14:37:51)

## 2018-08-26 ENCOUNTER — Inpatient Hospital Stay: Payer: Medicare Other

## 2018-08-26 ENCOUNTER — Ambulatory Visit: Payer: TRICARE For Life (TFL) | Admitting: Oncology

## 2018-08-26 ENCOUNTER — Other Ambulatory Visit: Payer: TRICARE For Life (TFL)

## 2018-08-26 VITALS — BP 136/64 | HR 70 | Temp 97.2°F | Resp 18

## 2018-08-26 DIAGNOSIS — D469 Myelodysplastic syndrome, unspecified: Secondary | ICD-10-CM

## 2018-08-26 DIAGNOSIS — D4621 Refractory anemia with excess of blasts 1: Secondary | ICD-10-CM | POA: Diagnosis not present

## 2018-08-26 MED ORDER — AZACITIDINE CHEMO SQ INJECTION
75.0000 mg/m2 | Freq: Once | INTRAMUSCULAR | Status: AC
  Administered 2018-08-26: 155 mg via SUBCUTANEOUS
  Filled 2018-08-26: qty 6.2

## 2018-08-26 MED ORDER — ONDANSETRON HCL 4 MG PO TABS
8.0000 mg | ORAL_TABLET | Freq: Once | ORAL | Status: AC
  Administered 2018-08-26: 8 mg via ORAL
  Filled 2018-08-26: qty 2

## 2018-08-26 NOTE — Patient Instructions (Signed)
Azacitidine suspension for injection (subcutaneous use)  What is this medicine?  AZACITIDINE (ay za SITE i deen) is a chemotherapy drug. This medicine reduces the growth of cancer cells and can suppress the immune system. It is used for treating myelodysplastic syndrome or some types of leukemia.  This medicine may be used for other purposes; ask your health care provider or pharmacist if you have questions.  COMMON BRAND NAME(S): Vidaza  What should I tell my health care provider before I take this medicine?  They need to know if you have any of these conditions:  -kidney disease  -liver disease  -liver tumors  -an unusual or allergic reaction to azacitidine, mannitol, other medicines, foods, dyes, or preservatives  -pregnant or trying to get pregnant  -breast-feeding  How should I use this medicine?  This medicine is for injection under the skin. It is administered in a hospital or clinic by a specially trained health care professional.  Talk to your pediatrician regarding the use of this medicine in children. While this drug may be prescribed for selected conditions, precautions do apply.  Overdosage: If you think you have taken too much of this medicine contact a poison control center or emergency room at once.  NOTE: This medicine is only for you. Do not share this medicine with others.  What if I miss a dose?  It is important not to miss your dose. Call your doctor or health care professional if you are unable to keep an appointment.  What may interact with this medicine?  Interactions have not been studied.  Give your health care provider a list of all the medicines, herbs, non-prescription drugs, or dietary supplements you use. Also tell them if you smoke, drink alcohol, or use illegal drugs. Some items may interact with your medicine.  This list may not describe all possible interactions. Give your health care provider a list of all the medicines, herbs, non-prescription drugs, or dietary supplements you  use. Also tell them if you smoke, drink alcohol, or use illegal drugs. Some items may interact with your medicine.  What should I watch for while using this medicine?  Visit your doctor for checks on your progress. This drug may make you feel generally unwell. This is not uncommon, as chemotherapy can affect healthy cells as well as cancer cells. Report any side effects. Continue your course of treatment even though you feel ill unless your doctor tells you to stop.  In some cases, you may be given additional medicines to help with side effects. Follow all directions for their use.  Call your doctor or health care professional for advice if you get a fever, chills or sore throat, or other symptoms of a cold or flu. Do not treat yourself. This drug decreases your body's ability to fight infections. Try to avoid being around people who are sick.  This medicine may increase your risk to bruise or bleed. Call your doctor or health care professional if you notice any unusual bleeding.  You may need blood work done while you are taking this medicine.  Do not become pregnant while taking this medicine and for 6 months after the last dose. Women should inform their doctor if they wish to become pregnant or think they might be pregnant. Men should not father a child while taking this medicine and for 3 months after the last dose. There is a potential for serious side effects to an unborn child. Talk to your health care professional or pharmacist for   more information. Do not breast-feed an infant while taking this medicine and for 1 week after the last dose.  This medicine may interfere with the ability to have a child. Talk with your doctor or health care professional if you are concerned about your fertility.  What side effects may I notice from receiving this medicine?  Side effects that you should report to your doctor or health care professional as soon as possible:  -allergic reactions like skin rash, itching or hives,  swelling of the face, lips, or tongue  -low blood counts - this medicine may decrease the number of white blood cells, red blood cells and platelets. You may be at increased risk for infections and bleeding.  -signs of infection - fever or chills, cough, sore throat, pain passing urine  -signs of decreased platelets or bleeding - bruising, pinpoint red spots on the skin, black, tarry stools, blood in the urine  -signs of decreased red blood cells - unusually weak or tired, fainting spells, lightheadedness  -signs and symptoms of kidney injury like trouble passing urine or change in the amount of urine  -signs and symptoms of liver injury like dark yellow or brown urine; general ill feeling or flu-like symptoms; light-colored stools; loss of appetite; nausea; right upper belly pain; unusually weak or tired; yellowing of the eyes or skin  Side effects that usually do not require medical attention (report to your doctor or health care professional if they continue or are bothersome):  -constipation  -diarrhea  -nausea, vomiting  -pain or redness at the injection site  -unusually weak or tired  This list may not describe all possible side effects. Call your doctor for medical advice about side effects. You may report side effects to FDA at 1-800-FDA-1088.  Where should I keep my medicine?  This drug is given in a hospital or clinic and will not be stored at home.  NOTE: This sheet is a summary. It may not cover all possible information. If you have questions about this medicine, talk to your doctor, pharmacist, or health care provider.   2019 Elsevier/Gold Standard (2016-09-15 14:37:51)

## 2018-08-29 ENCOUNTER — Inpatient Hospital Stay: Payer: Medicare Other

## 2018-08-29 ENCOUNTER — Other Ambulatory Visit: Payer: TRICARE For Life (TFL)

## 2018-08-29 ENCOUNTER — Ambulatory Visit: Payer: TRICARE For Life (TFL)

## 2018-08-29 VITALS — BP 134/67 | HR 72 | Temp 96.4°F | Resp 18

## 2018-08-29 DIAGNOSIS — D4621 Refractory anemia with excess of blasts 1: Secondary | ICD-10-CM | POA: Diagnosis not present

## 2018-08-29 DIAGNOSIS — D469 Myelodysplastic syndrome, unspecified: Secondary | ICD-10-CM

## 2018-08-29 MED ORDER — ONDANSETRON HCL 4 MG PO TABS
8.0000 mg | ORAL_TABLET | Freq: Once | ORAL | Status: AC
  Administered 2018-08-29: 8 mg via ORAL
  Filled 2018-08-29: qty 2

## 2018-08-29 MED ORDER — AZACITIDINE CHEMO SQ INJECTION
75.0000 mg/m2 | Freq: Once | INTRAMUSCULAR | Status: AC
  Administered 2018-08-29: 155 mg via SUBCUTANEOUS
  Filled 2018-08-29: qty 6.2

## 2018-08-29 NOTE — Patient Instructions (Signed)
Azacitidine suspension for injection (subcutaneous use)  What is this medicine?  AZACITIDINE (ay za SITE i deen) is a chemotherapy drug. This medicine reduces the growth of cancer cells and can suppress the immune system. It is used for treating myelodysplastic syndrome or some types of leukemia.  This medicine may be used for other purposes; ask your health care provider or pharmacist if you have questions.  COMMON BRAND NAME(S): Vidaza  What should I tell my health care provider before I take this medicine?  They need to know if you have any of these conditions:  -kidney disease  -liver disease  -liver tumors  -an unusual or allergic reaction to azacitidine, mannitol, other medicines, foods, dyes, or preservatives  -pregnant or trying to get pregnant  -breast-feeding  How should I use this medicine?  This medicine is for injection under the skin. It is administered in a hospital or clinic by a specially trained health care professional.  Talk to your pediatrician regarding the use of this medicine in children. While this drug may be prescribed for selected conditions, precautions do apply.  Overdosage: If you think you have taken too much of this medicine contact a poison control center or emergency room at once.  NOTE: This medicine is only for you. Do not share this medicine with others.  What if I miss a dose?  It is important not to miss your dose. Call your doctor or health care professional if you are unable to keep an appointment.  What may interact with this medicine?  Interactions have not been studied.  Give your health care provider a list of all the medicines, herbs, non-prescription drugs, or dietary supplements you use. Also tell them if you smoke, drink alcohol, or use illegal drugs. Some items may interact with your medicine.  This list may not describe all possible interactions. Give your health care provider a list of all the medicines, herbs, non-prescription drugs, or dietary supplements you  use. Also tell them if you smoke, drink alcohol, or use illegal drugs. Some items may interact with your medicine.  What should I watch for while using this medicine?  Visit your doctor for checks on your progress. This drug may make you feel generally unwell. This is not uncommon, as chemotherapy can affect healthy cells as well as cancer cells. Report any side effects. Continue your course of treatment even though you feel ill unless your doctor tells you to stop.  In some cases, you may be given additional medicines to help with side effects. Follow all directions for their use.  Call your doctor or health care professional for advice if you get a fever, chills or sore throat, or other symptoms of a cold or flu. Do not treat yourself. This drug decreases your body's ability to fight infections. Try to avoid being around people who are sick.  This medicine may increase your risk to bruise or bleed. Call your doctor or health care professional if you notice any unusual bleeding.  You may need blood work done while you are taking this medicine.  Do not become pregnant while taking this medicine and for 6 months after the last dose. Women should inform their doctor if they wish to become pregnant or think they might be pregnant. Men should not father a child while taking this medicine and for 3 months after the last dose. There is a potential for serious side effects to an unborn child. Talk to your health care professional or pharmacist for   more information. Do not breast-feed an infant while taking this medicine and for 1 week after the last dose.  This medicine may interfere with the ability to have a child. Talk with your doctor or health care professional if you are concerned about your fertility.  What side effects may I notice from receiving this medicine?  Side effects that you should report to your doctor or health care professional as soon as possible:  -allergic reactions like skin rash, itching or hives,  swelling of the face, lips, or tongue  -low blood counts - this medicine may decrease the number of white blood cells, red blood cells and platelets. You may be at increased risk for infections and bleeding.  -signs of infection - fever or chills, cough, sore throat, pain passing urine  -signs of decreased platelets or bleeding - bruising, pinpoint red spots on the skin, black, tarry stools, blood in the urine  -signs of decreased red blood cells - unusually weak or tired, fainting spells, lightheadedness  -signs and symptoms of kidney injury like trouble passing urine or change in the amount of urine  -signs and symptoms of liver injury like dark yellow or brown urine; general ill feeling or flu-like symptoms; light-colored stools; loss of appetite; nausea; right upper belly pain; unusually weak or tired; yellowing of the eyes or skin  Side effects that usually do not require medical attention (report to your doctor or health care professional if they continue or are bothersome):  -constipation  -diarrhea  -nausea, vomiting  -pain or redness at the injection site  -unusually weak or tired  This list may not describe all possible side effects. Call your doctor for medical advice about side effects. You may report side effects to FDA at 1-800-FDA-1088.  Where should I keep my medicine?  This drug is given in a hospital or clinic and will not be stored at home.  NOTE: This sheet is a summary. It may not cover all possible information. If you have questions about this medicine, talk to your doctor, pharmacist, or health care provider.   2019 Elsevier/Gold Standard (2016-09-15 14:37:51)

## 2018-08-30 ENCOUNTER — Ambulatory Visit: Payer: TRICARE For Life (TFL)

## 2018-08-30 ENCOUNTER — Inpatient Hospital Stay: Payer: Medicare Other

## 2018-08-30 ENCOUNTER — Other Ambulatory Visit: Payer: Self-pay

## 2018-09-01 ENCOUNTER — Ambulatory Visit: Payer: TRICARE For Life (TFL)

## 2018-09-01 ENCOUNTER — Inpatient Hospital Stay: Payer: Medicare Other

## 2018-09-01 ENCOUNTER — Inpatient Hospital Stay: Payer: Medicare Other | Attending: Oncology

## 2018-09-01 ENCOUNTER — Other Ambulatory Visit: Payer: TRICARE For Life (TFL)

## 2018-09-01 ENCOUNTER — Ambulatory Visit: Payer: TRICARE For Life (TFL) | Admitting: Oncology

## 2018-09-01 ENCOUNTER — Inpatient Hospital Stay (HOSPITAL_BASED_OUTPATIENT_CLINIC_OR_DEPARTMENT_OTHER): Payer: Medicare Other | Admitting: Oncology

## 2018-09-01 ENCOUNTER — Encounter: Payer: Self-pay | Admitting: Oncology

## 2018-09-01 ENCOUNTER — Other Ambulatory Visit: Payer: Self-pay

## 2018-09-01 VITALS — BP 133/67 | HR 79 | Temp 96.6°F | Resp 18 | Wt 183.0 lb

## 2018-09-01 DIAGNOSIS — Z79899 Other long term (current) drug therapy: Secondary | ICD-10-CM

## 2018-09-01 DIAGNOSIS — T451X5A Adverse effect of antineoplastic and immunosuppressive drugs, initial encounter: Secondary | ICD-10-CM

## 2018-09-01 DIAGNOSIS — D469 Myelodysplastic syndrome, unspecified: Secondary | ICD-10-CM

## 2018-09-01 DIAGNOSIS — D630 Anemia in neoplastic disease: Secondary | ICD-10-CM

## 2018-09-01 DIAGNOSIS — D649 Anemia, unspecified: Secondary | ICD-10-CM

## 2018-09-01 DIAGNOSIS — D6481 Anemia due to antineoplastic chemotherapy: Secondary | ICD-10-CM

## 2018-09-01 DIAGNOSIS — T451X5S Adverse effect of antineoplastic and immunosuppressive drugs, sequela: Secondary | ICD-10-CM

## 2018-09-01 DIAGNOSIS — Z5111 Encounter for antineoplastic chemotherapy: Secondary | ICD-10-CM

## 2018-09-01 DIAGNOSIS — D4621 Refractory anemia with excess of blasts 1: Secondary | ICD-10-CM | POA: Diagnosis present

## 2018-09-01 LAB — SAMPLE TO BLOOD BANK

## 2018-09-01 LAB — CBC WITH DIFFERENTIAL/PLATELET
Abs Immature Granulocytes: 0.13 10*3/uL — ABNORMAL HIGH (ref 0.00–0.07)
Basophils Absolute: 0 10*3/uL (ref 0.0–0.1)
Basophils Relative: 0 %
Eosinophils Absolute: 0 10*3/uL (ref 0.0–0.5)
Eosinophils Relative: 1 %
HCT: 24.3 % — ABNORMAL LOW (ref 39.0–52.0)
Hemoglobin: 7.3 g/dL — ABNORMAL LOW (ref 13.0–17.0)
Immature Granulocytes: 3 %
Lymphocytes Relative: 38 %
Lymphs Abs: 1.5 10*3/uL (ref 0.7–4.0)
MCH: 26.7 pg (ref 26.0–34.0)
MCHC: 30 g/dL (ref 30.0–36.0)
MCV: 89 fL (ref 80.0–100.0)
Monocytes Absolute: 0.5 10*3/uL (ref 0.1–1.0)
Monocytes Relative: 14 %
Neutro Abs: 1.7 10*3/uL (ref 1.7–7.7)
Neutrophils Relative %: 44 %
Platelets: 159 10*3/uL (ref 150–400)
RBC: 2.73 MIL/uL — ABNORMAL LOW (ref 4.22–5.81)
RDW: 17.5 % — ABNORMAL HIGH (ref 11.5–15.5)
WBC: 3.8 10*3/uL — AB (ref 4.0–10.5)
nRBC: 0 % (ref 0.0–0.2)

## 2018-09-01 LAB — COMPREHENSIVE METABOLIC PANEL
ALT: 15 U/L (ref 0–44)
ANION GAP: 8 (ref 5–15)
AST: 17 U/L (ref 15–41)
Albumin: 4.3 g/dL (ref 3.5–5.0)
Alkaline Phosphatase: 30 U/L — ABNORMAL LOW (ref 38–126)
BUN: 23 mg/dL (ref 8–23)
CO2: 27 mmol/L (ref 22–32)
Calcium: 8.9 mg/dL (ref 8.9–10.3)
Chloride: 101 mmol/L (ref 98–111)
Creatinine, Ser: 0.81 mg/dL (ref 0.61–1.24)
GFR calc Af Amer: >60 mL/min (ref >60)
GFR calc non Af Amer: >60 mL/min (ref >60)
Glucose, Bld: 283 mg/dL — ABNORMAL HIGH (ref 70–99)
Potassium: 4.8 mmol/L (ref 3.5–5.1)
Sodium: 136 mmol/L (ref 135–145)
Total Bilirubin: 0.8 mg/dL (ref 0.3–1.2)
Total Protein: 7 g/dL (ref 6.5–8.1)

## 2018-09-01 LAB — IRON AND TIBC
Iron: 240 ug/dL — ABNORMAL HIGH (ref 45–182)
Saturation Ratios: 89 % — ABNORMAL HIGH (ref 17.9–39.5)
TIBC: 270 ug/dL (ref 250–450)
UIBC: 30 ug/dL

## 2018-09-01 LAB — FERRITIN: Ferritin: 394 ng/mL — ABNORMAL HIGH (ref 24–336)

## 2018-09-01 MED ORDER — DARBEPOETIN ALFA 300 MCG/0.6ML IJ SOSY
300.0000 ug | PREFILLED_SYRINGE | Freq: Once | INTRAMUSCULAR | Status: AC
  Administered 2018-09-01: 300 ug via SUBCUTANEOUS

## 2018-09-01 NOTE — Progress Notes (Signed)
Hematology/Oncology Follow Up Note [facility]  Telephone:([phone] [phone]  Patient Care Team: Tracie Harrier, MD as PCP - General (Internal Medicine)   Name of the patient: Paul Pham  [id number]  09-21-1925   REASON FOR VISIT  follow-up for management of anemia. HISTORY OF PRESENT ILLNESS Paul Pham. is a  85 y.o.  male with PMH listed below who was present as a follow-up after hospital admission for management of anemia. Patient was seen during his most recent admission on 08/18/2017. Patient is heart hearing. He has been feeling increased fatigue, dyspnea, and hemoglobin has dropped from baseline 10 to 7 within the past couple of months. Last colonoscopy was done 10 years ago. Outpatient stool occult test is negative. Patient also takes iron pills and stool is always black.  # He has had extensive workup including urinalysis negative for hematuria, normal J09 and folic level. Normal TSH, negative hemolysis workup, negative stool occult. Inappropriate normal reticulocyte count. 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 With median survival 1.6 years.   #Goal of care was discussed, is palliative. Patient and his family members understand that this condition is not curable.   #08-12-2018-Bone marrow biopsy pathology was reviewed and discussed in detail with patient. Similar appearance of bone marrow morphology, 5% plasma cells similar to previous bone marrow biopsy, slightly higher cellularity overall Stable  disease.  Treatment:  Azacitidine 75 mg/m2  Day 1-5 of every 28 days cycle.  Cycle 1 3/7-3/8, 3/11-13     11/25/2017 1 unit PRBC transfusion cycle 2  12/06/17- 12/10/17, day 1-5   Cycle 3 5/6 -5/10. Cycle 4 6/3-02/04/18 Cycle 5 7/8- 03/11/2018 Cycle 6 8/5- 8/9         02/18/2018 1 unit PRBC transfusion Cycle 7 05/02/2018       04/07/2018 1 unit PRBC transfusion  04/28/2018 1 unit PRBC transfusion Cycle 8 05/30/2018     06/09/2018 1 unit PRBC transfusion Cycle 9 06/27/2018    07/21/2018 1 unit PRBC transfusion  Aranesp 131mg Q 2weeks [11/10/2017-11/25/2017]  Aranesp 3043m weekly  [01/07/2018- 06/23/2018]  switched to Aranesp 30068mQ2 weeks per patient's wife's requests due to cost  08/18/2018 hemoglobin is 6.7, 1 unit of PRBC transfusion on 08/19/18   Cycle 10 08/22/2018  Azacitidine   INTERVAL HISTORY Paul Retherfords a [age]o. male who has above history reviewed by me today presents for follow-up visit for management of MDS.  Patient has finished 10 cycles of azacitidine.  #Reports doing well.  Chronic fatigue is at baseline.  He remains very active and doing a lot of house chores. Had 70-year marriage anniversary on 08/23/2018. Denies significant shortness of breath with exertion more than his baseline. Retired remains good. Accompanied by wife today. Review of Systems  Constitutional: Positive for fatigue. Negative for appetite change, chills, fever and unexpected weight change.  HENT:   Positive for hearing loss. Negative for voice change.   Eyes: Negative for eye problems and icterus.  Respiratory: Positive for shortness of breath. Negative for chest tightness and cough.   Cardiovascular: Negative for chest  pain and leg swelling.  Gastrointestinal: Negative for abdominal distention and abdominal pain.  Endocrine: Negative for hot flashes.  Genitourinary: Negative for difficulty urinating, dysuria and frequency.   Musculoskeletal: Negative for arthralgias.  Skin: Negative for itching  and rash.  Neurological: Negative for extremity weakness, light-headedness and numbness.  Hematological: Negative for adenopathy. Does not bruise/bleed easily.  Psychiatric/Behavioral: Negative for confusion.   No Known Allergies   Past Medical History:  Diagnosis Date  . Anemia   . Arthritis   . BPH (benign prostatic hypertrophy)   . Complication of anesthesia    nausea  . Diabetes type 2, controlled ([place])   . HOH (hard of hearing)    r and L ears-70% loss per pt  . Hyperlipidemia   . Hypertension      Past Surgical History:  Procedure Laterality Date  . APPENDECTOMY    . COLONOSCOPY  09/21/08   1 polyp found, tubular adenoma  . HAND SURGERY    . KNEE SURGERY Left     Social History   Socioeconomic History  . Marital status: Married    Spouse name: Not on file  . Number of children: Not on file  . Years of education: Not on file  . Highest education level: Not on file  Occupational History  . Not on file  Social Needs  . Financial resource strain: Not on file  . Food insecurity:    Worry: Not on file    Inability: Not on file  . Transportation needs:    Medical: Not on file    Non-medical: Not on file  Tobacco Use  . Smoking status: Never Smoker  . Smokeless tobacco: Never Used  Substance and Sexual Activity  . Alcohol use: No    Alcohol/week: 0.0 standard drinks  . Drug use: No  . Sexual activity: Not on file  Lifestyle  . Physical activity:    Days per week: Not on file    Minutes per session: Not on file  . Stress: Not on file  Relationships  . Social connections:    Talks on phone: Not on file    Gets together: Not on file    Attends religious service: Not on file    Active member of club or organization: Not on file    Attends meetings of clubs or organizations: Not on file    Relationship status: Not on file  . Intimate partner violence:    Fear of current or ex partner: Not on file    Emotionally abused: Not on file    Physically  abused: Not on file    Forced sexual activity: Not on file  Other Topics Concern  . Not on file  Social History Narrative  . Not on file    Family History  Problem Relation Age of Onset  . Aneurysm Mother   . Heart disease Father   . Cancer Brother        skin  . Heart disease Brother   . Heart disease Brother   . Cancer Sister        skin  . Aneurysm Brother   . Heart disease Brother   . Heart disease Sister   . Heart disease Sister   . COPD Sister   . Diabetes Sister      Current Outpatient Medications:  .  aspirin EC 81 MG tablet, Take 81 mg by mouth daily., Disp: , Rfl:  .  Cranberry (THERACRAN PO), Take by mouth., Disp: ,  Rfl:  .  cyanocobalamin 1000 MCG tablet, Take 1,000 mcg by mouth daily., Disp: , Rfl:  .  finasteride (PROSCAR) 5 MG tablet, Take 5 mg by mouth daily., Disp: , Rfl:  .  lisinopril (PRINIVIL,ZESTRIL) 10 MG tablet, Take 10 mg by mouth daily., Disp: , Rfl:  .  loperamide (IMODIUM) 2 MG capsule, Take 1 capsule (2 mg total) by mouth See admin instructions. With onset of loose stool, take 81m followed by 222mevery 2 hours until loose bowel movement stopped. Maximum: 16 mg/day, Disp: 30 capsule, Rfl: 0 .  metformin (FORTAMET) 1000 MG (OSM) 24 hr tablet, Take 1,000 mg by mouth 2 (two) times daily with a meal., Disp: , Rfl:  .  Multiple Vitamins-Minerals (CENTRUM SILVER PO), Take by mouth., Disp: , Rfl:  .  Omega-3 Fatty Acids (FISH OIL PO), Take by mouth., Disp: , Rfl:  .  ondansetron (ZOFRAN) 4 MG tablet, Take 1 tablet (4 mg total) by mouth every 6 (six) hours as needed for nausea or vomiting., Disp: 30 tablet, Rfl: 0 .  Polyethylene Glycol 3350 (MIRALAX PO), Take by mouth., Disp: , Rfl:  .  Saw Palmetto-Phytosterols (PROSTATE SR PO), Take by mouth., Disp: , Rfl:  .  simvastatin (ZOCOR) 40 MG tablet, Take 40 mg by mouth daily., Disp: , Rfl:   Physical exam:  Vitals:   09/01/18 0903  BP: 133/67  Pulse: 79  Resp: 18  Temp: (!) 96.6 F (35.9 C)  TempSrc:  Tympanic  Weight: 182 lb 15.7 oz (83 kg)    Physical Exam  Constitutional: He is oriented to person, place, and time. No distress.  HENT:  Head: Normocephalic and atraumatic.  Nose: Nose normal.  Mouth/Throat: Oropharynx is clear and moist. No oropharyngeal exudate.  Eyes: Pupils are equal, round, and reactive to light. EOM are normal. No scleral icterus.  Pale conjunctivae   Neck: Normal range of motion. Neck supple. No JVD present.  Cardiovascular: Normal rate, regular rhythm and normal heart sounds.  No murmur heard. Pulmonary/Chest: Effort normal. No respiratory distress. He has no wheezes. He has no rales. He exhibits no tenderness.  Abdominal: Soft. Bowel sounds are normal. He exhibits no distension and no mass. There is no abdominal tenderness. There is no rebound.  Musculoskeletal: Normal range of motion.        General: No tenderness, deformity or edema.  Lymphadenopathy:    He has no cervical adenopathy.  Neurological: He is alert and oriented to person, place, and time. No cranial nerve deficit. He exhibits normal muscle tone. Coordination normal.  Skin: Skin is warm and dry. No rash noted. He is not diaphoretic. No erythema. There is pallor.  Psychiatric: Affect and judgment normal.    CMP Latest Ref Rng & Units 08/22/2018  Glucose 70 - 99 mg/dL 202(H)  BUN 8 - 23 mg/dL 22  Creatinine 0.61 - 1.24 mg/dL 0.70  Sodium 135 - 145 mmol/L 135  Potassium 3.5 - 5.1 mmol/L 4.4  Chloride 98 - 111 mmol/L 102  CO2 22 - 32 mmol/L 27  Calcium 8.9 - 10.3 mg/dL 8.6(L)  Total Protein 6.5 - 8.1 g/dL 6.9  Total Bilirubin 0.3 - 1.2 mg/dL 0.8  Alkaline Phos 38 - 126 U/L 31(L)  AST 15 - 41 U/L 18  ALT 0 - 44 U/L 15   CBC Latest Ref Rng & Units 09/01/2018  WBC 4.0 - 10.5 K/uL 3.8(L)  Hemoglobin 13.0 - 17.0 g/dL 7.3(L)  Hematocrit 39.0 - 52.0 % 24.3(L)  Platelets  150 - 400 K/uL 159      Assessment and plan Patient is a 85 y.o. male presents with MDS,RAEB-1, cytogenetics normal,  high risk gene mutations. 1. MDS (myelodysplastic syndrome) ([place])   2. 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 Overall tolerates azacitidine except for cytopenia. Patient will establish care with Dr. Kem Parkinson team on 09/19/2018 for cycle 11 azacitidine.  # Anemia: due to neoplasm and chemotherapy.  Proceed with Aranesp 362mg today.   # History of multiple blood transfusion.  Last iron panel check on 05/30/2018 showed ferritin of 144 and iron saturation 43. Continue monitor periodically.   We spent sufficient time to discuss many aspect of care, questions were answered to patient's satisfaction.  Follow up: 09/19/2018 lab cbc cmp hold tubes, establish care with Dr.Corcoran's team for evaluation prior to cycle 11 Azacitadine.   Total face to face encounter time for this patient visit was 25 min. >50% of the time was  spent in counseling and coordination of care.   ZEarlie Server MD, PhD Hematology Oncology CPromise Hospital Of [facility], Inc.at AMclean Hospital CorporationPager- [phone]/10/2018

## 2018-09-01 NOTE — Progress Notes (Signed)
Patient here for follow up. No concerns voiced.  °

## 2018-09-02 ENCOUNTER — Ambulatory Visit: Payer: TRICARE For Life (TFL)

## 2018-09-02 ENCOUNTER — Inpatient Hospital Stay: Payer: Medicare Other

## 2018-09-05 ENCOUNTER — Inpatient Hospital Stay: Payer: Medicare Other

## 2018-09-08 ENCOUNTER — Emergency Department: Payer: Medicare Other

## 2018-09-08 ENCOUNTER — Encounter: Payer: Self-pay | Admitting: Medical Oncology

## 2018-09-08 ENCOUNTER — Emergency Department
Admission: EM | Admit: 2018-09-08 | Discharge: 2018-09-08 | Disposition: A | Discharge status: Home / Self Care | Payer: Medicare Other | Attending: Emergency Medicine | Admitting: Emergency Medicine

## 2018-09-08 ENCOUNTER — Other Ambulatory Visit: Payer: TRICARE For Life (TFL)

## 2018-09-08 ENCOUNTER — Ambulatory Visit: Payer: TRICARE For Life (TFL)

## 2018-09-08 ENCOUNTER — Ambulatory Visit: Payer: TRICARE For Life (TFL) | Admitting: Hematology and Oncology

## 2018-09-08 DIAGNOSIS — S62639B Displaced fracture of distal phalanx of unspecified finger, initial encounter for open fracture: Secondary | ICD-10-CM | POA: Diagnosis not present

## 2018-09-08 DIAGNOSIS — X58XXXA Exposure to other specified factors, initial encounter: Secondary | ICD-10-CM | POA: Diagnosis not present

## 2018-09-08 DIAGNOSIS — Y929 Unspecified place or not applicable: Secondary | ICD-10-CM | POA: Diagnosis not present

## 2018-09-08 DIAGNOSIS — Z23 Encounter for immunization: Secondary | ICD-10-CM | POA: Diagnosis not present

## 2018-09-08 DIAGNOSIS — E119 Type 2 diabetes mellitus without complications: Secondary | ICD-10-CM | POA: Diagnosis not present

## 2018-09-08 DIAGNOSIS — Y939 Activity, unspecified: Secondary | ICD-10-CM | POA: Insufficient documentation

## 2018-09-08 DIAGNOSIS — S61314A Laceration without foreign body of right ring finger with damage to nail, initial encounter: Secondary | ICD-10-CM | POA: Insufficient documentation

## 2018-09-08 DIAGNOSIS — Y999 Unspecified external cause status: Secondary | ICD-10-CM | POA: Insufficient documentation

## 2018-09-08 DIAGNOSIS — Z7982 Long term (current) use of aspirin: Secondary | ICD-10-CM | POA: Insufficient documentation

## 2018-09-08 DIAGNOSIS — I10 Essential (primary) hypertension: Secondary | ICD-10-CM | POA: Diagnosis not present

## 2018-09-08 DIAGNOSIS — Z79899 Other long term (current) drug therapy: Secondary | ICD-10-CM | POA: Insufficient documentation

## 2018-09-08 DIAGNOSIS — S61209A Unspecified open wound of unspecified finger without damage to nail, initial encounter: Secondary | ICD-10-CM

## 2018-09-08 DIAGNOSIS — S61309A Unspecified open wound of unspecified finger with damage to nail, initial encounter: Secondary | ICD-10-CM

## 2018-09-08 DIAGNOSIS — S62634B Displaced fracture of distal phalanx of right ring finger, initial encounter for open fracture: Secondary | ICD-10-CM | POA: Insufficient documentation

## 2018-09-08 DIAGNOSIS — S6991XA Unspecified injury of right wrist, hand and finger(s), initial encounter: Secondary | ICD-10-CM | POA: Diagnosis present

## 2018-09-08 MED ORDER — BACITRACIN-NEOMYCIN-POLYMYXIN 400-5-5000 EX OINT: TOPICAL_OINTMENT | Freq: Once | CUTANEOUS | Status: DC

## 2018-09-08 MED ORDER — CEPHALEXIN 500 MG PO CAPS
500.0000 mg | ORAL_CAPSULE | Freq: Once | ORAL | Status: AC
  Administered 2018-09-08: 500 mg via ORAL
  Filled 2018-09-08: qty 1

## 2018-09-08 MED ORDER — CEPHALEXIN 500 MG PO CAPS: 500.0000 mg | ORAL_CAPSULE | Freq: Three times a day (TID) | ORAL | 0 refills | Status: AC

## 2018-09-08 MED ORDER — BUPIVACAINE HCL (PF) 0.5 % IJ SOLN
30.0000 mL | Freq: Once | INTRAMUSCULAR | Status: DC
  Filled 2018-09-08: qty 30

## 2018-09-08 MED ORDER — LIDOCAINE HCL (PF) 1 % IJ SOLN
5.0000 mL | Freq: Once | INTRAMUSCULAR | Status: DC
  Filled 2018-09-08: qty 5

## 2018-09-08 MED ORDER — TETANUS-DIPHTH-ACELL PERTUSSIS 5-2.5-18.5 LF-MCG/0.5 IM SUSP
0.5000 mL | Freq: Once | INTRAMUSCULAR | Status: AC
  Administered 2018-09-08: 0.5 mL via INTRAMUSCULAR
  Filled 2018-09-08: qty 0.5

## 2018-09-08 NOTE — Discharge Instructions (Addendum)
If you are unable to get an appointment with the hand specialist by next Tuesday, please see your primary care provider for a wound check and dressing change.   Keep the hand completely dry. If for some reason the dressing gets wet, remove and reapply it as soon as possible.   Return to the ER for any symptom of concern if unable to see primary care or the hand specialist.

## 2018-09-08 NOTE — ED Provider Notes (Addendum)
[facility] Emergency Department Provider Note  ____________________________________________  Time seen: Approximately 3:56 PM  I have reviewed the triage vital signs and the nursing notes.   HISTORY  Chief Complaint Finger Injury   HPI Paul Pham. is a 85 y.o. male who presents to the emergency department for treatment and evaluation of finger injury. He caught the end of his finger on a belt pulley of an air compressor. He states that it initially wouldn't turn and he was pulling on it. Tdap is not within the last several years.    Past Medical History:  Diagnosis Date  . Anemia   . Arthritis   . BPH (benign prostatic hypertrophy)   . Complication of anesthesia    nausea  . Diabetes type 2, controlled ([place])   . HOH (hard of hearing)    r and L ears-70% loss per pt  . Hyperlipidemia   . Hypertension     Patient Active Problem List   Diagnosis Date Noted  . 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 Symptomatic anemia 07/23/2018  . Encounter for antineoplastic chemotherapy 07/23/2018  . Goals of care, counseling/discussion 10/07/2017  . MDS (myelodysplastic syndrome) ([place]) 10/05/2017  . Neutropenia ([place])   . Anemia 08/17/2017    Past Surgical History:  Procedure Laterality Date  . APPENDECTOMY    . COLONOSCOPY  09/21/08   1 polyp found, tubular adenoma  . HAND SURGERY    . KNEE SURGERY Left     Prior to Admission medications   Medication Sig Start Date End Date Taking? Authorizing Provider  aspirin EC 81 MG tablet Take 81 mg by mouth daily.    [provider]  cephALEXin (KEFLEX) 500 MG capsule Take 1 capsule (500 mg total) by mouth 3 (three) times daily for 10 days. 09/08/18 09/18/18  Ahjanae Cassel, Dessa Phi, FNP  Cranberry (THERACRAN PO) Take by mouth.    [provider]  cyanocobalamin 1000 MCG tablet Take 1,000 mcg by mouth daily.    [provider]  finasteride (PROSCAR) 5 MG tablet Take 5 mg by mouth daily.     [provider]  lisinopril (PRINIVIL,ZESTRIL) 10 MG tablet Take 10 mg by mouth daily.    [provider]  loperamide (IMODIUM) 2 MG capsule Take 1 capsule (2 mg total) by mouth See admin instructions. With onset of loose stool, take 4mg  followed by 2mg  every 2 hours until loose bowel movement stopped. Maximum: 16 mg/day 01/27/18   Earlie Server, MD  metformin (FORTAMET) 1000 MG (OSM) 24 hr tablet Take 1,000 mg by mouth 2 (two) times daily with a meal.    [provider]  Multiple Vitamins-Minerals (CENTRUM SILVER PO) Take by mouth.    [provider]  Omega-3 Fatty Acids (FISH OIL PO) Take by mouth.    [provider]  ondansetron (ZOFRAN) 4 MG tablet Take 1 tablet (4 mg total) by mouth every 6 (six) hours as needed for nausea or vomiting. 11/05/17   Earlie Server, MD  Polyethylene Glycol 3350 (MIRALAX PO) Take by mouth.    [provider]  Saw Palmetto-Phytosterols (PROSTATE SR PO) Take by mouth.    [provider]  simvastatin (ZOCOR) 40 MG tablet Take 40 mg by mouth daily.    [provider]    Allergies Patient has no known allergies.  Family History  Problem Relation Age of Onset  . Aneurysm Mother   . Heart disease Father   . Cancer Brother  skin  . Heart disease Brother   . Heart disease Brother   . Cancer Sister        skin  . Aneurysm Brother   . Heart disease Brother   . Heart disease Sister   . Heart disease Sister   . COPD Sister   . Diabetes Sister     Social History Social History   Tobacco Use  . Smoking status: Never Smoker  . Smokeless tobacco: Never Used  Substance Use Topics  . Alcohol use: No    Alcohol/week: 0.0 standard drinks  . Drug use: No    Review of Systems  Constitutional: Negative for fever. Respiratory: Negative for cough or shortness of breath.  Musculoskeletal: Negative for myalgias Skin: Positive for ring finger avulsion. Neurological: Negative for numbness or  paresthesias. ____________________________________________   PHYSICAL EXAM:  VITAL SIGNS: ED Triage Vitals [09/08/18 1538]  Enc Vitals Group     BP 135/43     Pulse 86     Resp 15     Temp 98.1     Temp src      SpO2 99     Weight 182 lb 15.7 oz (83 kg)     Height      Head Circumference      Peak Flow      Pain Score 8     Pain Loc      Pain Edu?      Excl. in [place]?      Constitutional: Chronically ill appearing. Eyes: Conjunctivae are clear without discharge or drainage. Nose: No rhinorrhea noted. Mouth/Throat: Airway is patent.  Neck: No stridor. Unrestricted range of motion observed. Cardiovascular: Capillary refill is <3 seconds.  Respiratory: Respirations are even and unlabored.. Musculoskeletal: Unrestricted range of motion observed. Neurologic: Awake, alert, and oriented x 4.  Skin:  Finger pad avulsion and total nail avulsion on the right ring finger.  ____________________________________________   LABS (all labs ordered are listed, but only abnormal results are displayed)  Labs Reviewed - No data to display ____________________________________________  EKG  Not indicated. 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  Fracture at the base of the distal phalanx extends into the DIP and there is also a questionable associated nondisplaced fracture at the distal middle phalanx ____________________________________________   PROCEDURES  .Marland KitchenLaceration Repair Date/Time: 09/09/2018 12:04 AM Performed by: Victorino Dike, FNP Authorized by: Victorino Dike, FNP   Consent:    Consent obtained:  Verbal   Consent given by:  Patient   Risks discussed:  Infection, poor cosmetic result and poor wound healing Anesthesia (see MAR for exact dosages):    Anesthesia method:  Nerve block   Block location:   Digital, right hand, ring finger   Block needle gauge:  25 G   Block anesthetic:  Bupivacaine 0.25% w/o epi and lidocaine 1% w/o epi   Block injection procedure:  Anatomic landmarks identified and introduced needle   Block outcome:  Anesthesia achieved Laceration details:    Location:  Finger   Finger location:  R ring finger   Length (cm):  3 Repair type:    Repair type:  Complex Pre-procedure details:    Preparation:  Patient was prepped and draped in usual sterile fashion Exploration:    Hemostasis achieved with:  Tourniquet Treatment:    Area cleansed with:  Hibiclens and saline  Amount of cleaning:  Standard   Irrigation method:  Syringe   Visualized foreign bodies/material removed: no     Debridement:  None Skin repair:    Repair method:  Tissue adhesive Post-procedure details:    Dressing:  Non-adherent dressing, splint for protection and bulky dressing   Patient tolerance of procedure:  Tolerated well, no immediate complications Comments:     Nail of the right ring finger was completely avulsed.  Iodoform gauze was tucked underneath the cuticle after a finger tourniquet was applied for hemostasis.  The tourniquet remained in place during the procedure which lasted approximately 15 minutes.  There was return of capillary refill after it was removed.  The pad of the finger was exsanguinated and then Dermabond was applied over the wound.  There were 2 areas that continued to bleed and Surgicel was then applied with pressure.  Wound was monitored for about 20 minutes to ensure hemostasis then a bulky sterile dressing and rolled gauze was applied.  A splint was fashioned out of a strip of OCL in order to support the fractures and cover the wound to prevent further injury.   ____________________________________________   INITIAL IMPRESSION / ASSESSMENT AND PLAN / ED COURSE  Maxx Pham. is a 85 y.o. male presents to the ER after finger injury. Bleeding is fairly well  controlled. Will x-ray to evaluate for fracture or bone exposure. Tdap to be updated as well.   See above for the procedure.  While here, he was given Keflex and will receive a prescription for the same.  He was also given a referral to see the hand specialist and the wife verbalizes her intent to call in the morning to schedule an appointment.  He was advised to see his primary care provider Tuesday if he does not have an appointment with a hand specialist before then.  He was advised to return to the emergency department immediately for concerns if unable to see the primary care provider or the hand specialist.  Medications  Tdap (BOOSTRIX) injection 0.5 mL (0.5 mLs Intramuscular Given 09/08/18 1613)  cephALEXin (KEFLEX) capsule 500 mg (500 mg Oral Given 09/08/18 1912)     Pertinent labs & imaging results that were available during my care of the patient were reviewed by me and considered in my medical decision making (see chart for details).  ____________________________________________   FINAL CLINICAL IMPRESSION(S) / ED DIAGNOSES  Final diagnoses:  Open fracture of tuft of distal phalanx of finger  Avulsion of skin of finger, initial encounter  Fingernail avulsion, complete, initial encounter    ED Discharge Orders         Ordered    cephALEXin (KEFLEX) 500 MG capsule  3 times daily     09/08/18 1821           Note:  This document was prepared using Dragon voice recognition software and may include unintentional dictation errors.    Victorino Dike, FNP 09/09/18 0011    Harvest Dark, MD 09/09/18 [place], [place], FNP 09/22/18 1527    Harvest Dark, MD 09/23/18 1851

## 2018-09-08 NOTE — ED Triage Notes (Signed)
Pt reports that he caught the end of his finger on a belt pulley.

## 2018-09-08 NOTE — ED Notes (Signed)
See triage note  Presents with injury to right ring finger  States he got it caught on a pulley

## 2018-09-09 ENCOUNTER — Ambulatory Visit: Payer: TRICARE For Life (TFL)

## 2018-09-12 ENCOUNTER — Ambulatory Visit: Payer: TRICARE For Life (TFL) | Admitting: Hematology and Oncology

## 2018-09-12 ENCOUNTER — Ambulatory Visit: Payer: TRICARE For Life (TFL)

## 2018-09-12 ENCOUNTER — Other Ambulatory Visit: Payer: TRICARE For Life (TFL)

## 2018-09-13 ENCOUNTER — Other Ambulatory Visit: Payer: Self-pay

## 2018-09-13 DIAGNOSIS — D708 Other neutropenia: Secondary | ICD-10-CM

## 2018-09-13 DIAGNOSIS — D649 Anemia, unspecified: Secondary | ICD-10-CM

## 2018-09-18 NOTE — Progress Notes (Signed)
Paul Pham day:  09/19/2018  Chief Complaint: Paul Pham. 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  HPI:   The patient was admitted on 08/18/2017.  He presented with increased fatigue, and dyspnea.  Hemoglobin had dropped from baseline 10 to 7 within the past couple of months. Colonoscopy 10 years prior was negative.  Outpatient stool occult testing was negative. Stool was black on oral iron.  He has had extensive workup.  Urinalysis revealed no hematuria.  Normal studies included: D92, folic level, TSH, hemolysis workup.  He han an inappropriate normal reticulocyte count.  Abdomen and pelvis CT on 09/16/2017 revealed no acute process  Bone marrow biopsy on 09/22/2017 revealed a hypercellular bone marrow for age with dyspoietic changes, blast 5%.  Differential included RAEB-1 vs CMML, favor RAEB-1.  Foundation One testing was positive for ASXL1 E268TM*19, EZH2 splice site 622-2L>N, and RUNX1 A838s*56.  IPSS -R 4.5, intermediate group.  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  Goal of care was discussed (palliative).   Prior to initiation of treatment, he received 2 units on 08/17/2017 and 10/20/2017. He received azacytidine 75 mg/m2 day 1-5 every 28 days.  He has received 10 cycles from 11/04/2017 - 08/22/2018.  Cycle 1   11/04/2017 - 11/10/2017      1 unit PRBC on 11/26/2017   Cycle 2   12/06/2017 - 12/10/2017   Cycle 3   01/03/2018 - 01/07/2018 Cycle 4   01/31/2018 - 02/04/2018      1 unit PRBC on 02/18/2018 Cycle 5   03/07/2018 - 03/11/2018 Cycle 6   04/04/2018 - 04/08/2018      1 unit PRBC on 04/08/2018 and 04/29/2018 Cycle 7   05/02/2018 - 05/09/2018       Cycle 8   05/30/2018 - 06/03/2018      1 unit PRBC on 06/10/2018 Cycle 9   06/27/2018  -07/01/2018      1 unit PRBC on 07/22/2018 and 08/19/2018 Cycle 10 08/22/2018 - 08/29/2018        Bone  marrow biopsy (after 9 cycles) on 08/12/2018 was slightly hypercellular with a similar appearance with 5% blasts (stable).  He has received growth factor support.  Aranesp 140mg Q 2weeks [11/10/2017 - 11/25/2017] the Aranesp 300 mcg weekly  [01/07/2018 - 06/23/2018].  He was switched to Aranesp 3058m Q2 weeks per patient's wife's requests due to cost.  He last received Aranesp on 09/01/2018.  Labs on 11/27/2017 revealed a ferritin of 144 and iron saturation of 43%.  He has received 9 units PRBCs to date.  He last saw Dr. YuTasia Catchingsn 09/01/2018.  At that time, he was doing well.  He had chronic fatigue at baseline.  He remained very active was was doing a lot of house chores.  He denied significant shortness of breath with exertion more than his baseline.  Symptomatically, he is doing well overall.  He denies any acute complains. Patient has intermittent issues with diarrhea and constipation. Patient with chronic baseline fatigue. He makes efforts to remain as active as possible. Patient works around [employer] does yard work. Patient denies any bruising or bleeding.   Patient advises that he maintains an adequate appetite. He is eating well. Weight today is 185 lb 10 oz (84.2 kg), which compared to his last visit to the Pham, represents a 3 pound increase.  He  notes issues with constipation and diarrhea.  Patient with notable pallor. Patient denies pain in the Pham today.   Past Medical History:  Diagnosis Date  . Anemia   . Arthritis   . BPH (benign prostatic hypertrophy)   . Complication of anesthesia    nausea  . Diabetes type 2, controlled ([place])   . HOH (hard of hearing)    r and L ears-70% loss per pt  . Hyperlipidemia   . Hypertension     Past Surgical History:  Procedure Laterality Date  . APPENDECTOMY    . COLONOSCOPY  09/21/08   1 polyp found, tubular adenoma  . HAND SURGERY    . KNEE SURGERY Left     Family History  Problem Relation Age of Onset  . Aneurysm Mother    . Heart disease Father   . Cancer Brother        skin  . Heart disease Brother   . Heart disease Brother   . Cancer Sister        skin  . Aneurysm Brother   . Heart disease Brother   . Heart disease Sister   . Heart disease Sister   . COPD Sister   . Diabetes Sister     Social History:  reports that he has never smoked. He has never used smokeless tobacco. He reports that he does not drink alcohol or use drugs. 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 Patient denies known exposures to radiation on toxins.  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  He had his 48-year marriage anniversary on 08/23/2018.  The patient is accompanied by his wife today.  Allergies: No Known Allergies  Current Medications: Current Outpatient Medications  Medication Sig Dispense Refill  . Cranberry (THERACRAN PO) Take 1 tablet by mouth daily.     . cyanocobalamin 1000 MCG tablet Take 1,000 mcg by mouth daily.    . finasteride (PROSCAR) 5 MG tablet Take 5 mg by mouth daily.    Marland Kitchen glucose blood test strip FreeStyle Lite Strips    . LANCETS ULTRA FINE MISC Lancets,Ultra Thin 26 gauge    . lisinopril (PRINIVIL,ZESTRIL) 10 MG tablet Take 10 mg by mouth daily.    . meclizine (ANTIVERT) 25 MG tablet Take 25 mg by mouth 2 (two) times daily as needed.     . metformin (FORTAMET) 1000 MG (OSM) 24 hr tablet Take 1,000 mg by mouth 2 (two) times daily with a meal.    . Multiple Vitamins-Minerals (CENTRUM SILVER PO) Take by mouth.    . Omega-3 Fatty Acids (FISH OIL PO) Take 1 tablet by mouth daily.     . Polyethylene Glycol 3350 (MIRALAX PO) Take by mouth.    [name] (PROSTATE SR PO) Take 1 tablet by mouth daily.     . simvastatin (ZOCOR) 40 MG tablet Take 40 mg by mouth daily.    Marland Kitchen loperamide (IMODIUM) 2 MG capsule Take 1 capsule (2 mg total) by mouth See admin instructions. With onset of loose stool, take 64m followed by 264mevery 2 hours until loose bowel movement stopped. Maximum: 16  mg/day (Patient not taking: Reported on 09/19/2018) 30 capsule 0  . ondansetron (ZOFRAN) 4 MG tablet Take 1 tablet (4 mg total) by mouth every 6 (six) hours as needed for nausea or vomiting. (Patient not taking: Reported on 09/19/2018) 30 tablet 0   No current facility-administered medications for this visit.     Review of  Systems:  GENERAL:  Feels "better since last visit".  No fevers, sweats.  Weight loss of 2 pounds in the past year. PERFORMANCE STATUS (ECOG):  1 HEENT:  "30% hearing".  Runny nose.  No visual changes, sore throat, mouth sores or tenderness. Lungs: No shortness of breath or cough.  No hemoptysis. Cardiac:  No chest pain, palpitations, orthopnea, or PND. GI:  Constipation and diarrhea.  No nausea, vomiting, diarrhea, constipation, melena or hematochezia. GU:  No urgency, frequency, dysuria, or hematuria. Musculoskeletal:  No back pain.  No joint pain.  No muscle tenderness. Extremities:  No pain or swelling. Skin:  No rashes or skin changes. Neuro:  No headache, numbness or weakness, balance or coordination issues. Endocrine:  Diabetes.  No thyroid issues, hot flashes or night sweats. Psych:  No mood changes, depression or anxiety. Pain:  No focal pain. Review of systems:  All other systems reviewed and found to be negative.  Physical Exam: Blood pressure (!) 154/60, pulse 68, temperature 98.5 F (36.9 C), temperature source Tympanic, resp. rate 16, weight 185 lb 10 oz (84.2 kg). GENERAL:  Well developed, well nourished,elderly gentleman sitting comfortably in the exam room in no acute distress. MENTAL STATUS:  Alert and oriented to person, place and time. HEAD:  Short gray hair.  Normocephalic, atraumatic, face symmetric, no Cushingoid features. EYES:   Pupils equal round and reactive to light and accomodation.  No conjunctivitis or scleral icterus. ENT:  Hearing aide.  Oropharynx clear without lesion.  Tongue normal. Mucous membranes moist.  RESPIRATORY:  Clear to  auscultation without rales, wheezes or rhonchi. CARDIOVASCULAR:  Regular rate and rhythm without murmur, rub or gallop. ABDOMEN:  Soft, non-tender, with active bowel sounds, and no hepatosplenomegaly.  No masses. SKIN:  No rashes, ulcers or lesions. EXTREMITIES: No edema, no skin discoloration or tenderness.  No palpable cords. LYMPH NODES: No palpable cervical, supraclavicular, axillary or inguinal adenopathy  NEUROLOGICAL: Unremarkable. PSYCH:  Appropriate.   Appointment on 09/19/2018  Component Date Value Ref Range Status  . Sodium 09/19/2018 134* 135 - 145 mmol/L Final  . Potassium 09/19/2018 4.2  3.5 - 5.1 mmol/L Final  . Chloride 09/19/2018 102  98 - 111 mmol/L Final  . CO2 09/19/2018 23  22 - 32 mmol/L Final  . Glucose, Bld 09/19/2018 221* 70 - 99 mg/dL Final  . BUN 09/19/2018 18  8 - 23 mg/dL Final  . Creatinine, Ser 09/19/2018 0.63  0.61 - 1.24 mg/dL Final  . Calcium 09/19/2018 8.6* 8.9 - 10.3 mg/dL Final  . Total Protein 09/19/2018 6.8  6.5 - 8.1 g/dL Final  . Albumin 09/19/2018 4.0  3.5 - 5.0 g/dL Final  . AST 09/19/2018 19  15 - 41 U/L Final  . ALT 09/19/2018 15  0 - 44 U/L Final  . Alkaline Phosphatase 09/19/2018 26* 38 - 126 U/L Final  . Total Bilirubin 09/19/2018 0.8  0.3 - 1.2 mg/dL Final  . GFR calc non Af Amer 09/19/2018 >60  >60 mL/min Final  . GFR calc Af Amer 09/19/2018 >60  >60 mL/min Final  . Anion gap 09/19/2018 9  5 - 15 Final   Performed at [facility], [address], [place], [place] [address]  . WBC 09/19/2018 4.8  4.0 - 10.5 K/uL Final  . RBC 09/19/2018 2.44* 4.22 - 5.81 MIL/uL Final  . Hemoglobin 09/19/2018 6.6* 13.0 - 17.0 g/dL Final  . HCT 09/19/2018 22.7* 39.0 - 52.0 % Final  . MCV 09/19/2018 93.0  80.0 -  100.0 fL Final  . MCH 09/19/2018 27.0  26.0 - 34.0 pg Final  . MCHC 09/19/2018 29.1* 30.0 - 36.0 g/dL Final  . RDW 09/19/2018 20.2* 11.5 - 15.5 % Final  . Platelets 09/19/2018 219  150 - 400 K/uL Final  . nRBC 09/19/2018 0.6* 0.0  - 0.2 % Final  . Neutrophils Relative % 09/19/2018 56  % Final  . Neutro Abs 09/19/2018 2.7  1.7 - 7.7 K/uL Final  . Lymphocytes Relative 09/19/2018 30  % Final  . Lymphs Abs 09/19/2018 1.4  0.7 - 4.0 K/uL Final  . Monocytes Relative 09/19/2018 12  % Final  . Monocytes Absolute 09/19/2018 0.6  0.1 - 1.0 K/uL Final  . Eosinophils Relative 09/19/2018 0  % Final  . Eosinophils Absolute 09/19/2018 0.0  0.0 - 0.5 K/uL Final  . Basophils Relative 09/19/2018 0  % Final  . Basophils Absolute 09/19/2018 0.0  0.0 - 0.1 K/uL Final  . Immature Granulocytes 09/19/2018 2  % Final  . Abs Immature Granulocytes 09/19/2018 0.08* 0.00 - 0.07 K/uL Final   Performed at [facility], [address], [place], [place] [address]    Assessment:  Gatlyn Lipari. is a 85 y.o. 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  Bone marrow on 09/22/2017 revealed a hypercellular for age with dyspoietic changes variably involving myeloid cell lines, but with main involvement of the granulocytic/monocytic cell line. This was associated with bone marrow monocytosis and borderline number to slight increase in blastic cells. The overall changes favor a primary myeloid neoplasm, particularly a myelodysplastic syndrome especially refractory anemia with excess blasts (RAEB-1) or possibly refractory cytopenia with multilineage dysplasia. Consideration was also given to an evolving myelodysplastic/myeloproliferative neoplasm such as chronic myelomonocytic leukemia but the lack of absolute peripheral monocytosis precluded such a diagnosis at this time. Flow cytometry was negative.  Cytogenetics were normal (46, XY). Foundation one was positive for ASXL1 U440HK*74, EZH2 Splice site 259-5G>L, RUNX1 [id number].  IPSS-R 4.5, intermediate group.  Anemia work-up on 08/17/2017: Vitamin B12 (658), folate (22.0), and TSH (2.688). Retic was 2.6%.  Haptoglobin was 116 on 08/19/2017.  Epo level was 104.8 on 10/05/2017.  Ferritin has been followed:  106 on 08/17/2017,  131 on 11/04/2017, 144 on 05/30/2018, and 394 on 09/01/2018.  Iron saturation was 34% on 08/17/2017 and 89% on 09/01/2018.  Bone marrow on 09/22/2017 revealed a hypercellular for age with dyspoietic changes variably involving myeloid cell lines, but with main involvement of the granulocytic/monocytic cell line. This was associated with bone marrow monocytosis and borderline number to slight increase in blasts (5%). The overall changes favor a primary myeloid neoplasm, particularly a myelodysplastic syndrome especially refractory anemia with excess blasts (RAEB-1) or possibly refractory cytopenia with multilineage dysplasia. Consideration was also given to an evolving myelodysplastic/myeloproliferative neoplasm such as chronic myelomonocytic leukemia but the lack of absolute peripheral monocytosis precluded such a diagnosis at this time. Flow cytometry was negative.  Cytogenetics were normal (46, XY). Foundation One was positive for ASXL1 O756EP*32, EZH2 Splice site 951-8A>C, RUNX1 [id number].  He has received 10 cycles of Vidaza (11/04/2017 - 08/22/2018).  He has received Aranesp (initially 150 mcg every 2 weeks post chemotherapy then 300 mcg every 1-2 weeks after cycle #3).  He has received 1-2 units of PRBCs with each cycle (last 08/19/2018).  Bone marrow on 08/12/2018 revealed a hypercellular marrow with dyspoietic changes.  There was a borderline number of blasts (5%) seen by morphology. The findings were similar to previous biopsy although the cellularity is slightly  higher in the current material.consistent with previously known myelodysplastic syndrome.  Flow cytometry was negative.  Cytogenetics were normal (46, XY).  Symptomatically, he feels at his baseline.  He denies any fevers or sweats.  Plan: 1.  Labs today:  CBC with diff, CMP, TSH, B12, folate, retic, hold tube. 2.  Refractory anemia with excess blasts (RAEB-1):  Discuss initial and follow-up biopsy.  Bone marrow is stable.  Discuss no  improvement with Vidaza.  Patient still requiring 1-2 units with each cycle.  Discuss ongoing use of Aranesp support.  Discuss postponing azacytidine.  Discuss Aranesp alone today.  Discuss supportive care (Aranesp alone) versus referral to [facility].for second opinion and other options. 3.  Aranesp today 4.  RTC tomorrow for 1 unit of PRBCs. 5.  RTC in 2 weeks for MD assessment, labs (CBC with diff, CMP), and Aranesp.   Honor Loh, NP  09/19/2018, 11:47 AM   I saw and evaluated the patient, participating in the key portions of the service and reviewing pertinent diagnostic studies and records.  I reviewed the nurse practitioner's note and agree with the findings and the plan.  The assessment and plan were discussed with the patient.  Multiple questions were asked by the patient and answered.   Nolon Stalls, MD 09/19/2018,11:47 AM

## 2018-09-19 ENCOUNTER — Other Ambulatory Visit: Payer: TRICARE For Life (TFL)

## 2018-09-19 ENCOUNTER — Ambulatory Visit: Payer: TRICARE For Life (TFL)

## 2018-09-19 ENCOUNTER — Inpatient Hospital Stay: Payer: Medicare Other | Attending: Hematology and Oncology

## 2018-09-19 ENCOUNTER — Encounter: Payer: Self-pay | Admitting: Hematology and Oncology

## 2018-09-19 ENCOUNTER — Inpatient Hospital Stay (HOSPITAL_BASED_OUTPATIENT_CLINIC_OR_DEPARTMENT_OTHER): Payer: Medicare Other | Admitting: Hematology and Oncology

## 2018-09-19 ENCOUNTER — Ambulatory Visit: Payer: TRICARE For Life (TFL) | Admitting: Hematology and Oncology

## 2018-09-19 ENCOUNTER — Inpatient Hospital Stay: Payer: Medicare Other

## 2018-09-19 VITALS — BP 154/60 | HR 68 | Temp 98.5°F | Resp 16 | Wt 185.6 lb

## 2018-09-19 DIAGNOSIS — Z79899 Other long term (current) drug therapy: Secondary | ICD-10-CM | POA: Insufficient documentation

## 2018-09-19 DIAGNOSIS — D4621 Refractory anemia with excess of blasts 1: Secondary | ICD-10-CM

## 2018-09-19 DIAGNOSIS — D649 Anemia, unspecified: Secondary | ICD-10-CM

## 2018-09-19 DIAGNOSIS — Z7189 Other specified counseling: Secondary | ICD-10-CM

## 2018-09-19 DIAGNOSIS — D708 Other neutropenia: Secondary | ICD-10-CM

## 2018-09-19 DIAGNOSIS — D469 Myelodysplastic syndrome, unspecified: Secondary | ICD-10-CM | POA: Insufficient documentation

## 2018-09-19 LAB — COMPREHENSIVE METABOLIC PANEL
ALT: 15 U/L (ref 0–44)
AST: 19 U/L (ref 15–41)
Albumin: 4 g/dL (ref 3.5–5.0)
Alkaline Phosphatase: 26 U/L — ABNORMAL LOW (ref 38–126)
Anion gap: 9 (ref 5–15)
BUN: 18 mg/dL (ref 8–23)
CO2: 23 mmol/L (ref 22–32)
Calcium: 8.6 mg/dL — ABNORMAL LOW (ref 8.9–10.3)
Chloride: 102 mmol/L (ref 98–111)
Creatinine, Ser: 0.63 mg/dL (ref 0.61–1.24)
GFR calc Af Amer: >60 mL/min (ref >60)
GFR calc non Af Amer: >60 mL/min (ref >60)
Glucose, Bld: 221 mg/dL — ABNORMAL HIGH (ref 70–99)
Potassium: 4.2 mmol/L (ref 3.5–5.1)
Sodium: 134 mmol/L — ABNORMAL LOW (ref 135–145)
Total Bilirubin: 0.8 mg/dL (ref 0.3–1.2)
Total Protein: 6.8 g/dL (ref 6.5–8.1)

## 2018-09-19 LAB — CBC WITH DIFFERENTIAL/PLATELET
Abs Immature Granulocytes: 0.08 10*3/uL — ABNORMAL HIGH (ref 0.00–0.07)
Basophils Absolute: 0 10*3/uL (ref 0.0–0.1)
Basophils Relative: 0 %
Eosinophils Absolute: 0 10*3/uL (ref 0.0–0.5)
Eosinophils Relative: 0 %
HCT: 22.7 % — ABNORMAL LOW (ref 39.0–52.0)
Hemoglobin: 6.6 g/dL — ABNORMAL LOW (ref 13.0–17.0)
Immature Granulocytes: 2 %
Lymphocytes Relative: 30 %
Lymphs Abs: 1.4 10*3/uL (ref 0.7–4.0)
MCH: 27 pg (ref 26.0–34.0)
MCHC: 29.1 g/dL — ABNORMAL LOW (ref 30.0–36.0)
MCV: 93 fL (ref 80.0–100.0)
Monocytes Absolute: 0.6 10*3/uL (ref 0.1–1.0)
Monocytes Relative: 12 %
Neutro Abs: 2.7 10*3/uL (ref 1.7–7.7)
Neutrophils Relative %: 56 %
Platelets: 219 10*3/uL (ref 150–400)
RBC: 2.44 MIL/uL — ABNORMAL LOW (ref 4.22–5.81)
RDW: 20.2 % — ABNORMAL HIGH (ref 11.5–15.5)
WBC: 4.8 10*3/uL (ref 4.0–10.5)
nRBC: 0.6 % — ABNORMAL HIGH (ref 0.0–0.2)

## 2018-09-19 LAB — RETICULOCYTES
Immature Retic Fract: 53.2 % — ABNORMAL HIGH (ref 2.3–15.9)
RBC.: 2.52 MIL/uL — ABNORMAL LOW (ref 4.22–5.81)
Retic Count, Absolute: 69.3 10*3/uL (ref 19.0–186.0)
Retic Ct Pct: 2.8 % (ref 0.4–3.1)

## 2018-09-19 LAB — VITAMIN B12: Vitamin B-12: 749 pg/mL (ref 180–914)

## 2018-09-19 LAB — SAMPLE TO BLOOD BANK

## 2018-09-19 LAB — PREPARE RBC (CROSSMATCH)

## 2018-09-19 LAB — TSH: TSH: 2.196 u[IU]/mL (ref 0.350–4.500)

## 2018-09-19 LAB — FOLATE: Folate: 34 ng/mL (ref >5.9)

## 2018-09-19 MED ORDER — DARBEPOETIN ALFA 300 MCG/0.6ML IJ SOSY
300.0000 ug | PREFILLED_SYRINGE | Freq: Once | INTRAMUSCULAR | Status: AC
  Administered 2018-09-19: 300 ug via SUBCUTANEOUS

## 2018-09-19 NOTE — Patient Instructions (Signed)

## 2018-09-19 NOTE — Progress Notes (Signed)
Pt here for follow up. Pt denies any complaints at this time. Reports he finished taking Keflex yesterday for right ring finger injury on 1/9.  Changing dressing and soaking BID. Reports small amount of clear drainage with small amount of blood. Denies any purulent drainage, redness, foul odor, and worsening pain.

## 2018-09-20 ENCOUNTER — Inpatient Hospital Stay: Payer: Medicare Other

## 2018-09-20 DIAGNOSIS — D4621 Refractory anemia with excess of blasts 1: Secondary | ICD-10-CM | POA: Diagnosis not present

## 2018-09-20 DIAGNOSIS — D469 Myelodysplastic syndrome, unspecified: Secondary | ICD-10-CM

## 2018-09-20 MED ORDER — ACETAMINOPHEN 325 MG PO TABS
650.0000 mg | ORAL_TABLET | Freq: Once | ORAL | Status: AC
  Administered 2018-09-20: 650 mg via ORAL
  Filled 2018-09-20: qty 2

## 2018-09-20 MED ORDER — SODIUM CHLORIDE 0.9% IV SOLUTION
250.0000 mL | Freq: Once | INTRAVENOUS | Status: AC
  Administered 2018-09-20: 250 mL via INTRAVENOUS
  Filled 2018-09-20: qty 250

## 2018-09-20 MED ORDER — DIPHENHYDRAMINE HCL 25 MG PO CAPS
25.0000 mg | ORAL_CAPSULE | Freq: Once | ORAL | Status: AC
  Administered 2018-09-20: 25 mg via ORAL
  Filled 2018-09-20: qty 1

## 2018-09-20 NOTE — Patient Instructions (Signed)
Blood Transfusion, Adult, Care After This sheet gives you information about how to care for yourself after your procedure. Your doctor may also give you more specific instructions. If you have problems or questions, contact your doctor. Follow these instructions at home:   Take over-the-counter and prescription medicines only as told by your doctor.  Go back to your normal activities as told by your doctor.  Follow instructions from your doctor about how to take care of the area where an IV tube was put into your vein (insertion site). Make sure you: ? Wash your hands with soap and water before you change your bandage (dressing). If there is no soap and water, use hand sanitizer. ? Change your bandage as told by your doctor.  Check your IV insertion site every day for signs of infection. Check for: ? More redness, swelling, or pain. ? More fluid or blood. ? Warmth. ? Pus or a bad smell. Contact a doctor if:  You have more redness, swelling, or pain around the IV insertion site.  You have more fluid or blood coming from the IV insertion site.  Your IV insertion site feels warm to the touch.  You have pus or a bad smell coming from the IV insertion site.  Your pee (urine) turns pink, red, or brown.  You feel weak after doing your normal activities. Get help right away if:  You have signs of a serious allergic or body defense (immune) system reaction, including: ? Itchiness. ? Hives. ? Trouble breathing. ? Anxiety. ? Pain in your chest or lower back. ? Fever, flushing, and chills. ? Fast pulse. ? Rash. ? Watery poop (diarrhea). ? Throwing up (vomiting). ? Dark pee. ? Serious headache. ? Dizziness. ? Stiff neck. ? Yellow color in your face or the white parts of your eyes (jaundice). Summary  After a blood transfusion, return to your normal activities as told by your doctor.  Every day, check for signs of infection where the IV tube was put into your vein.  Some  signs of infection are warm skin, more redness and pain, more fluid or blood, and pus or a bad smell where the needle went in.  Contact your doctor if you feel weak or have any unusual symptoms. This information is not intended to replace advice given to you by your health care provider. Make sure you discuss any questions you have with your health care provider. Document Released: 09/07/2014 Document Revised: 04/10/2016 Document Reviewed: 04/10/2016 Elsevier Interactive Patient Education  2019 Elsevier Inc.  

## 2018-09-21 ENCOUNTER — Ambulatory Visit: Payer: TRICARE For Life (TFL)

## 2018-09-21 ENCOUNTER — Ambulatory Visit: Payer: TRICARE For Life (TFL) | Admitting: Hematology and Oncology

## 2018-09-21 ENCOUNTER — Other Ambulatory Visit: Payer: TRICARE For Life (TFL)

## 2018-09-21 LAB — BPAM RBC
Blood Product Expiration Date: 202001302359
ISSUE DATE / TIME: 202001210955
Unit Type and Rh: 5100

## 2018-09-21 LAB — TYPE AND SCREEN
ABO/RH(D): B POS
Antibody Screen: NEGATIVE
Unit division: 0

## 2018-09-22 ENCOUNTER — Ambulatory Visit: Payer: TRICARE For Life (TFL)

## 2018-09-23 ENCOUNTER — Ambulatory Visit: Payer: TRICARE For Life (TFL)

## 2018-10-02 NOTE — Progress Notes (Signed)
[facility] day:  10/03/2018  Chief Complaint: Paul Pham. is a 85 y.o. 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  HPI:   The patient was last seen in the hematology clinic on 09/19/2018.  At that time, he felt at his baseline.  He denied any fevers or sweats.  CBC revealed a hematocrit of 22.7, hemoglobin 6.6, MCV 93.0, platelets 219,000, WBC 4800 with an ANC of 2700.  Reticulocytes were 2.8%.  B12 was 749.  Folate was 34.0.  TSH was 2.196.  He received Aranesp 300 mcg on 09/19/2018.  He received 1 unit of PRBCs on 09/20/2018.  During the interim, patient feels "about the same". Patient denies that he has experienced any B symptoms. He denies any interval infections. He denies any episodes of chest pain or shortness of breath. Patient denies bleeding; no hematochezia, melena, or gross hematuria. He has not experienced any episodes on unexplained bruising.   Patient with avulsion injury to the 4th digit of his RIGHT hand on 09/08/2018. His finger got caught in the pulley that was attached to an air compressor. Patient was seen in the ED and has been referred to a "hand specialist" at [facility].   Patient advises that he maintains an adequate appetite. He is eating well. Weight today is 186 lb 8.2 oz (84.6 kg), which compared to his last visit to the clinic, represents a 1 pound increase.    Patient denies pain in the clinic today.   Past Medical History:  Diagnosis Date  . Anemia   . Arthritis   . BPH (benign prostatic hypertrophy)   . Complication of anesthesia    nausea  . Diabetes type 2, controlled ([place])   . HOH (hard of hearing)    r and L ears-70% loss per pt  . Hyperlipidemia   . Hypertension     Past Surgical History:  Procedure Laterality Date  . APPENDECTOMY    . COLONOSCOPY  09/21/08   1 polyp found, tubular adenoma  . HAND  SURGERY    . KNEE SURGERY Left     Family History  Problem Relation Age of Onset  . Aneurysm Mother   . Heart disease Father   . Cancer Brother        skin  . Heart disease Brother   . Heart disease Brother   . Cancer Sister        skin  . Aneurysm Brother   . Heart disease Brother   . Heart disease Sister   . Heart disease Sister   . COPD Sister   . Diabetes Sister     Social History:  reports that he has never smoked. He has never used smokeless tobacco. He reports that he does not drink alcohol or use drugs. 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 Patient denies known exposures to radiation on toxins.  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  He had his 70-year marriage anniversary on 08/23/2018.  The patient is accompanied by his wife Inez Catalina), son Louie Casa), and daughter-in-law today.  Allergies: No Known Allergies  Current Medications: Current Outpatient Medications  Medication Sig Dispense Refill  . Cranberry (THERACRAN PO) Take 1 tablet by mouth daily.     . cyanocobalamin 1000 MCG tablet Take 1,000 mcg by mouth daily.    . finasteride (PROSCAR) 5 MG tablet Take  5 mg by mouth daily.    Marland Kitchen lisinopril (PRINIVIL,ZESTRIL) 10 MG tablet Take 10 mg by mouth daily.    . meclizine (ANTIVERT) 25 MG tablet Take 25 mg by mouth 2 (two) times daily as needed.     . metformin (FORTAMET) 1000 MG (OSM) 24 hr tablet Take 1,000 mg by mouth 2 (two) times daily with a meal.    . Multiple Vitamins-Minerals (CENTRUM SILVER PO) Take by mouth.    . Omega-3 Fatty Acids (FISH OIL PO) Take 1 tablet by mouth daily.     . Polyethylene Glycol 3350 (MIRALAX PO) Take by mouth.    [name] (PROSTATE SR PO) Take 1 tablet by mouth daily.     . simvastatin (ZOCOR) 40 MG tablet Take 40 mg by mouth daily.    Marland Kitchen glucose blood test strip FreeStyle Lite Strips    . LANCETS ULTRA FINE MISC Lancets,Ultra Thin 26 gauge    . loperamide (IMODIUM) 2 MG capsule Take 1 capsule (2 mg  total) by mouth See admin instructions. With onset of loose stool, take 4mg  followed by 2mg  every 2 hours until loose bowel movement stopped. Maximum: 16 mg/day (Patient not taking: Reported on 10/03/2018) 30 capsule 0  . ondansetron (ZOFRAN) 4 MG tablet Take 1 tablet (4 mg total) by mouth every 6 (six) hours as needed for nausea or vomiting. (Patient not taking: Reported on 09/19/2018) 30 tablet 0   No current facility-administered medications for this visit.     Review of Systems:  GENERAL:  Feels "the same".  No fevers, sweats.  Weight up 1 pound. PERFORMANCE STATUS (ECOG):  1 HEENT:  Decreased hearing.  No visual changes, runny nose, sore throat, mouth sores or tenderness. Lungs: No shortness of breath or cough.  No hemoptysis. Cardiac:  No chest pain, palpitations, orthopnea, or PND. GI:  No nausea, vomiting, diarrhea, constipation, melena or hematochezia. GU:  No urgency, frequency, dysuria, or hematuria. Musculoskeletal:  No back pain.  No joint pain.  No muscle tenderness. Extremities:  No pain or swelling. Skin:  No rashes or skin changes. Neuro:  No headache, numbness or weakness, balance or coordination issues. Endocrine:  Diabetes.  No thyroid issues, hot flashes or night sweats. Psych:  No mood changes, depression or anxiety. Pain:  No focal pain. Review of systems:  All other systems reviewed and found to be negative.   Physical Exam: Blood pressure (!) 155/44, pulse 68, temperature 97.9 F (36.6 C), temperature source Oral, resp. rate 18, height 6\' 1"  (1.854 m), weight 186 lb 8.2 oz (84.6 kg), SpO2 100 %. GENERAL:  Well developed, well nourished, elderly gentleman sitting comfortably in the exam room in no acute distress. MENTAL STATUS:  Alert and oriented to person, place and time. HEAD:  Near alopecia.  Normocephalic, atraumatic, face symmetric, no Cushingoid features. EYES:  Gold rimmed glasses.  Pupils equal round and reactive to light and accomodation.  No conjunctivitis  or scleral icterus. ENT:  Hearing aide.  Oropharynx clear without lesion.  Tongue normal. Mucous membranes moist.  RESPIRATORY:  Clear to auscultation without rales, wheezes or rhonchi. CARDIOVASCULAR:  Regular rate and rhythm without murmur, rub or gallop. ABDOMEN:  Soft, non-tender, with active bowel sounds, and no hepatosplenomegaly.  No masses. SKIN:  No rashes, ulcers or lesions. EXTREMITIES: Right 4th finger injury.  No edema, no skin discoloration or tenderness.  No palpable cords. LYMPH NODES: No palpable cervical, supraclavicular, axillary or inguinal adenopathy  NEUROLOGICAL: Unremarkable. PSYCH:  Appropriate.  Appointment on 10/03/2018  Component Date Value Ref Range Status  . Sodium 10/03/2018 134* 135 - 145 mmol/L Final  . Potassium 10/03/2018 4.0  3.5 - 5.1 mmol/L Final  . Chloride 10/03/2018 101  98 - 111 mmol/L Final  . CO2 10/03/2018 24  22 - 32 mmol/L Final  . Glucose, Bld 10/03/2018 211* 70 - 99 mg/dL Final  . BUN 10/03/2018 19  8 - 23 mg/dL Final  . Creatinine, Ser 10/03/2018 0.58* 0.61 - 1.24 mg/dL Final  . Calcium 10/03/2018 8.7* 8.9 - 10.3 mg/dL Final  . Total Protein 10/03/2018 6.8  6.5 - 8.1 g/dL Final  . Albumin 10/03/2018 4.1  3.5 - 5.0 g/dL Final  . AST 10/03/2018 20  15 - 41 U/L Final  . ALT 10/03/2018 14  0 - 44 U/L Final  . Alkaline Phosphatase 10/03/2018 25* 38 - 126 U/L Final  . Total Bilirubin 10/03/2018 0.9  0.3 - 1.2 mg/dL Final  . GFR calc non Af Amer 10/03/2018 >60  >60 mL/min Final  . GFR calc Af Amer 10/03/2018 >60  >60 mL/min Final  . Anion gap 10/03/2018 9  5 - 15 Final   Performed at [facility], [address], [place], [place] [address]  . WBC 10/03/2018 4.5  4.0 - 10.5 K/uL Final  . RBC 10/03/2018 3.01* 4.22 - 5.81 MIL/uL Final  . Hemoglobin 10/03/2018 8.0* 13.0 - 17.0 g/dL Final  . HCT 10/03/2018 27.5* 39.0 - 52.0 % Final  . MCV 10/03/2018 91.4  80.0 - 100.0 fL Final  . MCH 10/03/2018 26.6  26.0 - 34.0 pg Final  .  MCHC 10/03/2018 29.1* 30.0 - 36.0 g/dL Final  . RDW 10/03/2018 18.1* 11.5 - 15.5 % Final  . Platelets 10/03/2018 264  150 - 400 K/uL Final  . nRBC 10/03/2018 0.0  0.0 - 0.2 % Final  . Neutrophils Relative % 10/03/2018 51  % Final  . Neutro Abs 10/03/2018 2.3  1.7 - 7.7 K/uL Final  . Lymphocytes Relative 10/03/2018 30  % Final  . Lymphs Abs 10/03/2018 1.3  0.7 - 4.0 K/uL Final  . Monocytes Relative 10/03/2018 17  % Final  . Monocytes Absolute 10/03/2018 0.8  0.1 - 1.0 K/uL Final  . Eosinophils Relative 10/03/2018 0  % Final  . Eosinophils Absolute 10/03/2018 0.0  0.0 - 0.5 K/uL Final  . Basophils Relative 10/03/2018 0  % Final  . Basophils Absolute 10/03/2018 0.0  0.0 - 0.1 K/uL Final  . Immature Granulocytes 10/03/2018 2  % Final  . Abs Immature Granulocytes 10/03/2018 0.07  0.00 - 0.07 K/uL Final   Performed at [facility], [address] [place], [place] [address]    Assessment:  Candace Ramus. is a 85 y.o. 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  Bone marrow on 09/22/2017 revealed a hypercellular for age with dyspoietic changes variably involving myeloid cell lines, but with main involvement of the granulocytic/monocytic cell line. This was associated with bone marrow monocytosis and borderline number to slight increase in blastic cells. The overall changes favor a primary myeloid neoplasm, particularly a myelodysplastic syndrome especially refractory anemia with excess blasts (RAEB-1) or possibly refractory cytopenia with multilineage dysplasia. Consideration was also given to an evolving myelodysplastic/myeloproliferative neoplasm such as chronic myelomonocytic leukemia but the lack of absolute peripheral monocytosis precluded such a diagnosis at this time. Flow cytometry was negative.  Cytogenetics were normal (46, XY). Foundation one was positive for ASXL1 X448JE*56, EZH2 Splice site 314-9F>W, RUNX1 [id number].  IPSS-R 4.5, intermediate group.  Anemia work-up on 08/17/2017: Vitamin B12 (658),  folate (22.0), and TSH (2.688). Retic was 2.6%.  Haptoglobin was 116 on 08/19/2017.  Epo level was 104.8 on 10/05/2017.  Ferritin has been followed:  106 on 08/17/2017, 131 on 11/04/2017, 144 on 05/30/2018, and 394 on 09/01/2018.  Iron saturation was 34% on 08/17/2017 and 89% on 09/01/2018.  Bone marrow on 09/22/2017 revealed a hypercellular for age with dyspoietic changes variably involving myeloid cell lines, but with main involvement of the granulocytic/monocytic cell line. This was associated with bone marrow monocytosis and borderline number to slight increase in blasts (5%). The overall changes favor a primary myeloid neoplasm, particularly a myelodysplastic syndrome especially refractory anemia with excess blasts (RAEB-1) or possibly refractory cytopenia with multilineage dysplasia. Consideration was also given to an evolving myelodysplastic/myeloproliferative neoplasm such as chronic myelomonocytic leukemia but the lack of absolute peripheral monocytosis precluded such a diagnosis at this time. Flow cytometry was negative.  Cytogenetics were normal (46, XY). Foundation One was positive for ASXL1 [id number]*04, EZH2 Splice site 540-9W>J, RUNX1 [id number].  He has received 10 cycles of Vidaza (11/04/2017 - 08/22/2018).  He has received Aranesp (initially 150 mcg every 2 weeks post chemotherapy then 300 mcg every 1-2 weeks after cycle #3; last 09/19/2018).  He has received 1-2 units of PRBCs with each cycle (last 09/20/2018).  Bone marrow on 08/12/2018 revealed a hypercellular marrow with dyspoietic changes.  There was a borderline number of blasts (5%) seen by morphology. The findings were similar to previous biopsy although the cellularity is slightly higher in the current material.consistent with previously known myelodysplastic syndrome.  Flow cytometry was negative.  Cytogenetics were normal (46, XY).  Symptomatically, he denies any complaints.  Weight is up 1 pound.  Exam is stable.  Hemoglobin is  8.0.  Plan: 1.   Labs today:  CBC with diff, CMP. 2.   Refractory anemia with excess blasts (RAEB-1):  Family present today for conference and discussion regarding direction of therapy.  Review initial and second bone marrow- stable disease.  Discuss transfusion needs (1-2 units with each cycle)- no improvement.  Discuss introduction of Aranesp to treatment plan with Vidaza, ' Discuss improvement in hemoglobin on Aranesp alone in past 2 weeks.  Family notes "highest it has been in a long time".  Discuss no need for transfusion today.  Discuss follow-up with [facility] (Dr Rosezella Rumpf) regarding use of Vidaza or possibly Dacogen in the future.  Discuss patient with high risk disease and benefit of maintaining low blast count (progression to acute leukemia). 3.   Aranesp 300 mcg today. 4.   Referral to [facility] (Dr Rosezella Rumpf). 5.   RTC in 2 weeks for MD assessment, labs (CBC with diff, CMP), Aranesp, and follow-up discussions after interval [facility] consult.   Honor Loh, NP  10/03/2018, 10:27 AM   I saw and evaluated the patient, participating in the key portions of the service and reviewing pertinent diagnostic studies and records.  I reviewed the nurse practitioner's note and agree with the findings and the plan.  The assessment and plan were discussed with the patient.  Multiple questions were asked by the patient and family and answered.   Nolon Stalls, MD 10/03/2018,10:27 AM

## 2018-10-03 ENCOUNTER — Inpatient Hospital Stay: Payer: Medicare Other

## 2018-10-03 ENCOUNTER — Inpatient Hospital Stay: Payer: Medicare Other | Attending: Hematology and Oncology | Admitting: Hematology and Oncology

## 2018-10-03 ENCOUNTER — Encounter: Payer: Self-pay | Admitting: Hematology and Oncology

## 2018-10-03 VITALS — BP 155/44 | HR 68 | Temp 97.9°F | Resp 18 | Ht 73.0 in | Wt 186.5 lb

## 2018-10-03 DIAGNOSIS — D469 Myelodysplastic syndrome, unspecified: Secondary | ICD-10-CM | POA: Diagnosis not present

## 2018-10-03 DIAGNOSIS — D4621 Refractory anemia with excess of blasts 1: Secondary | ICD-10-CM | POA: Diagnosis present

## 2018-10-03 DIAGNOSIS — Z79899 Other long term (current) drug therapy: Secondary | ICD-10-CM

## 2018-10-03 DIAGNOSIS — Z7189 Other specified counseling: Secondary | ICD-10-CM

## 2018-10-03 LAB — CBC WITH DIFFERENTIAL/PLATELET
Abs Immature Granulocytes: 0.07 10*3/uL (ref 0.00–0.07)
Basophils Absolute: 0 10*3/uL (ref 0.0–0.1)
Basophils Relative: 0 %
Eosinophils Absolute: 0 10*3/uL (ref 0.0–0.5)
Eosinophils Relative: 0 %
HCT: 27.5 % — ABNORMAL LOW (ref 39.0–52.0)
Hemoglobin: 8 g/dL — ABNORMAL LOW (ref 13.0–17.0)
Immature Granulocytes: 2 %
Lymphocytes Relative: 30 %
Lymphs Abs: 1.3 10*3/uL (ref 0.7–4.0)
MCH: 26.6 pg (ref 26.0–34.0)
MCHC: 29.1 g/dL — ABNORMAL LOW (ref 30.0–36.0)
MCV: 91.4 fL (ref 80.0–100.0)
Monocytes Absolute: 0.8 10*3/uL (ref 0.1–1.0)
Monocytes Relative: 17 %
Neutro Abs: 2.3 10*3/uL (ref 1.7–7.7)
Neutrophils Relative %: 51 %
Platelets: 264 10*3/uL (ref 150–400)
RBC: 3.01 MIL/uL — ABNORMAL LOW (ref 4.22–5.81)
RDW: 18.1 % — ABNORMAL HIGH (ref 11.5–15.5)
WBC: 4.5 10*3/uL (ref 4.0–10.5)
nRBC: 0 % (ref 0.0–0.2)

## 2018-10-03 LAB — COMPREHENSIVE METABOLIC PANEL
ALT: 14 U/L (ref 0–44)
AST: 20 U/L (ref 15–41)
Albumin: 4.1 g/dL (ref 3.5–5.0)
Alkaline Phosphatase: 25 U/L — ABNORMAL LOW (ref 38–126)
Anion gap: 9 (ref 5–15)
BUN: 19 mg/dL (ref 8–23)
CO2: 24 mmol/L (ref 22–32)
Calcium: 8.7 mg/dL — ABNORMAL LOW (ref 8.9–10.3)
Chloride: 101 mmol/L (ref 98–111)
Creatinine, Ser: 0.58 mg/dL — ABNORMAL LOW (ref 0.61–1.24)
GFR calc Af Amer: >60 mL/min (ref >60)
GFR calc non Af Amer: >60 mL/min (ref >60)
Glucose, Bld: 211 mg/dL — ABNORMAL HIGH (ref 70–99)
Potassium: 4 mmol/L (ref 3.5–5.1)
Sodium: 134 mmol/L — ABNORMAL LOW (ref 135–145)
Total Bilirubin: 0.9 mg/dL (ref 0.3–1.2)
Total Protein: 6.8 g/dL (ref 6.5–8.1)

## 2018-10-03 MED ORDER — DARBEPOETIN ALFA 300 MCG/0.6ML IJ SOSY
300.0000 ug | PREFILLED_SYRINGE | Freq: Once | INTRAMUSCULAR | Status: AC
  Administered 2018-10-03: 300 ug via SUBCUTANEOUS
  Filled 2018-10-03: qty 0.6

## 2018-10-03 NOTE — Progress Notes (Signed)
No new changes noted today 

## 2018-10-16 NOTE — Progress Notes (Signed)
[facility] day:  10/17/2018  Chief Complaint: Paul Pham. is a 85 y.o. 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  HPI:   The patient was last seen in the hematology clinic on 10/03/2018.  At that time, he felt "about the same".  He denied any increased fatigue, shortness of breath or chest pain.  He had an avulsion injury of his right 4th digit.  He had been referred to a hand specialist.  He received Aranesp.  He was seen by Dr. Ivette Loyal at [facility] on 10/11/2018.  It was felt beneficial to try a trial of Aranesp alone to assess its contribution to improving his hemoglobin.  The beneficial effects of Vidaza in terms of improvement in QOL and prolongation of survival were felt not necessarily dependent on objective response.  As long as he was tolerating the drug and not progressing, he should continue Vidaza.  His family noted that after the longer break from the last cycle,  Paul Pham stamina and well being improved quite significantly.  He was felt to benefit from extending the cycles to every 5 - 6 weeks at least for as long as the therapy was well tolerated.    During the interim, pain is doing well. He denies any acute symptoms today. No chest pain or shortness of breath. He feels generally well. Patient denies bleeding; no hematochezia, melena, or gross hematuria. He has not experienced any bruising. Patient denies that he has experienced any B symptoms. He denies any interval infections.   Patient advises that he maintains an adequate appetite. He is eating well. Weight today is 181 lb 3.5 oz (82.2 kg), which compared to his last visit to the clinic, represents a 5 pound weight loss.   Patient denies pain in the clinic today    Past Medical History:  Diagnosis Date  . Anemia   . Arthritis   . BPH (benign prostatic hypertrophy)   . Complication of anesthesia    nausea  . Diabetes type 2,  controlled ([place])   . HOH (hard of hearing)    r and L ears-70% loss per pt  . Hyperlipidemia   . Hypertension     Past Surgical History:  Procedure Laterality Date  . APPENDECTOMY    . COLONOSCOPY  09/21/08   1 polyp found, tubular adenoma  . HAND SURGERY    . KNEE SURGERY Left     Family History  Problem Relation Age of Onset  . Aneurysm Mother   . Heart disease Father   . Cancer Brother        skin  . Heart disease Brother   . Heart disease Brother   . Cancer Sister        skin  . Aneurysm Brother   . Heart disease Brother   . Heart disease Sister   . Heart disease Sister   . COPD Sister   . Diabetes Sister     Social History:  reports that he has never smoked. He has never used smokeless tobacco. He reports that he does not drink alcohol or use drugs. 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 Patient denies known exposures to radiation on toxins.  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  He had his 70-year marriage anniversary on 08/23/2018.  The patient is accompanied by his wife, daughter, and son-in-law today.  Allergies: No Known  Allergies  Current Medications: Current Outpatient Medications  Medication Sig Dispense Refill  . Cranberry (THERACRAN PO) Take 1 tablet by mouth daily.     . cyanocobalamin 1000 MCG tablet Take 1,000 mcg by mouth daily.    . finasteride (PROSCAR) 5 MG tablet Take 5 mg by mouth daily.    Marland Kitchen glucose blood test strip FreeStyle Lite Strips    . LANCETS ULTRA FINE MISC Lancets,Ultra Thin 26 gauge    . lisinopril (PRINIVIL,ZESTRIL) 10 MG tablet Take 10 mg by mouth daily.    . meclizine (ANTIVERT) 25 MG tablet Take 25 mg by mouth 2 (two) times daily as needed.     . metformin (FORTAMET) 1000 MG (OSM) 24 hr tablet Take 1,000 mg by mouth 2 (two) times daily with a meal.    . Multiple Vitamins-Minerals (CENTRUM SILVER PO) Take by mouth.    . Omega-3 Fatty Acids (FISH OIL PO) Take 1 tablet by mouth daily.     . Polyethylene Glycol  3350 (MIRALAX PO) Take by mouth.    [name] (PROSTATE SR PO) Take 1 tablet by mouth daily.     . simvastatin (ZOCOR) 40 MG tablet Take 40 mg by mouth daily.    Marland Kitchen loperamide (IMODIUM) 2 MG capsule Take 1 capsule (2 mg total) by mouth See admin instructions. With onset of loose stool, take 4mg  followed by 2mg  every 2 hours until loose bowel movement stopped. Maximum: 16 mg/day (Patient not taking: Reported on 10/03/2018) 30 capsule 0  . ondansetron (ZOFRAN) 4 MG tablet Take 1 tablet (4 mg total) by mouth every 6 (six) hours as needed for nausea or vomiting. (Patient not taking: Reported on 09/19/2018) 30 tablet 0   No current facility-administered medications for this visit.     Review of Systems:  GENERAL:  Feels "good".  No fevers, sweats.  Weight down 5 pounds. PERFORMANCE STATUS (ECOG):  1 HEENT:  Decreased hearing.  No visual changes, runny nose, sore throat, mouth sores or tenderness. Lungs: No shortness of breath or cough.  No hemoptysis. Cardiac:  No chest pain, palpitations, orthopnea, or PND. GI:  Constipation and diarrhea.  No nausea, vomiting, melena or hematochezia. GU:  No urgency, frequency, dysuria, or hematuria. Musculoskeletal:  No back pain.  No joint pain.  No muscle tenderness. Extremities:  No pain or swelling. Skin:  No rashes or skin changes. Neuro:  No headache, numbness or weakness, balance or coordination issues. Endocrine: Diabetes.  No thyroid issues, hot flashes or night sweats. Psych:  No mood changes, depression or anxiety. Pain:  No focal pain. Review of systems:  All other systems reviewed and found to be negative.   Physical Exam: Blood pressure (!) 128/54, pulse 72, temperature 99.1 F (37.3 C), temperature source Tympanic, resp. rate 18, height 6\' 1"  (1.854 m), weight 181 lb 3.5 oz (82.2 kg), SpO2 100 %. GENERAL:  Well developed, well nourished, elderly gentleman sitting comfortably in the exam room in no acute distress. MENTAL STATUS:   Alert and oriented to person, place and time. HEAD:  Near alopecia/male pattern baldness.  Gray hair.  Normocephalic, atraumatic, face symmetric, no Cushingoid features. EYES:  Pupils equal round and reactive to light and accomodation.  No conjunctivitis or scleral icterus. ENT:  Hearing aide.  Oropharynx clear without lesion.  Tongue normal. Mucous membranes moist.  RESPIRATORY:  Clear to auscultation without rales, wheezes or rhonchi. CARDIOVASCULAR:  Regular rate and rhythm without murmur, rub or gallop. ABDOMEN:  Soft, non-tender, with active  bowel sounds, and no hepatosplenomegaly.  No masses. SKIN:  No rashes, ulcers or lesions. EXTREMITIES: No edema, no skin discoloration or tenderness.  No palpable cords. LYMPH NODES: No palpable cervical, supraclavicular, axillary or inguinal adenopathy  NEUROLOGICAL: Unremarkable. PSYCH:  Appropriate.    Appointment on 10/17/2018  Component Date Value Ref Range Status  . Sodium 10/17/2018 136  135 - 145 mmol/L Final  . Potassium 10/17/2018 4.2  3.5 - 5.1 mmol/L Final  . Chloride 10/17/2018 101  98 - 111 mmol/L Final  . CO2 10/17/2018 26  22 - 32 mmol/L Final  . Glucose, Bld 10/17/2018 223* 70 - 99 mg/dL Final  . BUN 10/17/2018 19  8 - 23 mg/dL Final  . Creatinine, Ser 10/17/2018 0.70  0.61 - 1.24 mg/dL Final  . Calcium 10/17/2018 8.9  8.9 - 10.3 mg/dL Final  . Total Protein 10/17/2018 7.2  6.5 - 8.1 g/dL Final  . Albumin 10/17/2018 4.3  3.5 - 5.0 g/dL Final  . AST 10/17/2018 18  15 - 41 U/L Final  . ALT 10/17/2018 16  0 - 44 U/L Final  . Alkaline Phosphatase 10/17/2018 31* 38 - 126 U/L Final  . Total Bilirubin 10/17/2018 0.9  0.3 - 1.2 mg/dL Final  . GFR calc non Af Amer 10/17/2018 >60  >60 mL/min Final  . GFR calc Af Amer 10/17/2018 >60  >60 mL/min Final  . Anion gap 10/17/2018 9  5 - 15 Final   Performed at [facility], [address], [place], [place] [address]  . WBC 10/17/2018 4.0  4.0 - 10.5 K/uL Final  . RBC  10/17/2018 2.78* 4.22 - 5.81 MIL/uL Final  . Hemoglobin 10/17/2018 7.3* 13.0 - 17.0 g/dL Final  . HCT 10/17/2018 25.2* 39.0 - 52.0 % Final  . MCV 10/17/2018 90.6  80.0 - 100.0 fL Final  . MCH 10/17/2018 26.3  26.0 - 34.0 pg Final  . MCHC 10/17/2018 29.0* 30.0 - 36.0 g/dL Final  . RDW 10/17/2018 18.1* 11.5 - 15.5 % Final  . Platelets 10/17/2018 200  150 - 400 K/uL Final  . nRBC 10/17/2018 0.0  0.0 - 0.2 % Final  . Neutrophils Relative % 10/17/2018 47  % Final  . Neutro Abs 10/17/2018 1.9  1.7 - 7.7 K/uL Final  . Lymphocytes Relative 10/17/2018 37  % Final  . Lymphs Abs 10/17/2018 1.5  0.7 - 4.0 K/uL Final  . Monocytes Relative 10/17/2018 15  % Final  . Monocytes Absolute 10/17/2018 0.6  0.1 - 1.0 K/uL Final  . Eosinophils Relative 10/17/2018 0  % Final  . Eosinophils Absolute 10/17/2018 0.0  0.0 - 0.5 K/uL Final  . Basophils Relative 10/17/2018 0  % Final  . Basophils Absolute 10/17/2018 0.0  0.0 - 0.1 K/uL Final  . Immature Granulocytes 10/17/2018 1  % Final  . Abs Immature Granulocytes 10/17/2018 0.05  0.00 - 0.07 K/uL Final   Performed at [facility], [address] [place], [place] [address]    Assessment:  Paul Pham. is a 85 y.o. 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  Bone marrow on 09/22/2017 revealed a hypercellular for age with dyspoietic changes variably involving myeloid cell lines, but with main involvement of the granulocytic/monocytic cell line. This was associated with bone marrow monocytosis and borderline number to slight increase in blastic cells. The overall changes favor a primary myeloid neoplasm, particularly a myelodysplastic syndrome especially refractory anemia with excess blasts (RAEB-1) or possibly refractory cytopenia with multilineage dysplasia. Consideration was also  given to an evolving myelodysplastic/myeloproliferative neoplasm such as chronic myelomonocytic leukemia but the lack of absolute peripheral monocytosis precluded such a diagnosis at this time. Flow  cytometry was negative.  Cytogenetics were normal (46, XY). Foundation one was positive for ASXL1 Y782NF*62, EZH2 Splice site 130-8M>V, RUNX1 [id number].  IPSS-R 4.5, intermediate group.  Anemia work-up on 08/17/2017: Vitamin B12 (658), folate (22.0), and TSH (2.688). Retic was 2.6%.  Haptoglobin was 116 on 08/19/2017.  Epo level was 104.8 on 10/05/2017.  Ferritin has been followed:  106 on 08/17/2017, 131 on 11/04/2017, 144 on 05/30/2018, and 394 on 09/01/2018.  Iron saturation was 34% on 08/17/2017 and 89% on 09/01/2018.  Bone marrow on 09/22/2017 revealed a hypercellular for age with dyspoietic changes variably involving myeloid cell lines, but with main involvement of the granulocytic/monocytic cell line. This was associated with bone marrow monocytosis and borderline number to slight increase in blasts (5%). The overall changes favor a primary myeloid neoplasm, particularly a myelodysplastic syndrome especially refractory anemia with excess blasts (RAEB-1) or possibly refractory cytopenia with multilineage dysplasia. Consideration was also given to an evolving myelodysplastic/myeloproliferative neoplasm such as chronic myelomonocytic leukemia but the lack of absolute peripheral monocytosis precluded such a diagnosis at this time. Flow cytometry was negative.  Cytogenetics were normal (46, XY). Foundation One was positive for ASXL1 [id number]*29, EZH2 Splice site 528-4X>L, RUNX1 [id number].  He has received 10 cycles of Vidaza (11/04/2017 - 08/22/2018).  He has received Aranesp (initially 150 mcg every 2 weeks post chemotherapy then 300 mcg every 1-2 weeks after cycle #3; last 09/19/2018).  He last received Aranesp on 10/17/2018.  He has received 1-2 units of PRBCs with each cycle (last 09/20/2018).  Bone marrow on 08/12/2018 revealed a hypercellular marrow with dyspoietic changes.  There was a borderline number of blasts (5%) seen by morphology. The findings were similar to previous biopsy although the  cellularity is slightly higher in the current material.consistent with previously known myelodysplastic syndrome.  Flow cytometry was negative.  Cytogenetics were normal (46, XY).  Symptomatically, he feels good.  Weight is down 5 pounds.  Exam is stable.  Hemoglobin is 7.3.  Plan: 1.  Labs today:  CBC with diff, CMP, type and screen. 2.  Refractory anemia with excess blasts (RAEB-1):  Discuss interval [facility] consult.  Bone marrow is stable.  No increased blasts.  Goal of therapy is to decrease transfusion dependence and prevent acceleration of disease (blasts).  Discuss continuation of Vidaza every 5-6 weeks as tolerated.  Discuss continuation of Aranesp every 2 weeks secondary to decreased transfusion dependence.  3.  Aranesp today. 4.  RTC on 10/18/2018 for 1 unit of PRBCs. 5.  RTC on 10/24/2018 for MD assessment, labs (CBC with diff, CMP), and cycle #11 Vidaza (day 1-5).    Honor Loh, NP  10/17/2018, 11:07 AM   I saw and evaluated the patient, participating in the key portions of the service and reviewing pertinent diagnostic studies and records.  I reviewed the nurse practitioner's note and agree with the findings and the plan.  The assessment and plan were discussed with the patient.  Multiple questions were asked by the patient and answered.   Nolon Stalls, MD 10/17/2018,11:07 AM

## 2018-10-17 ENCOUNTER — Inpatient Hospital Stay: Payer: Medicare Other

## 2018-10-17 ENCOUNTER — Inpatient Hospital Stay (HOSPITAL_BASED_OUTPATIENT_CLINIC_OR_DEPARTMENT_OTHER): Payer: Medicare Other | Admitting: Hematology and Oncology

## 2018-10-17 ENCOUNTER — Encounter: Payer: Self-pay | Admitting: Hematology and Oncology

## 2018-10-17 VITALS — BP 128/54 | HR 72 | Temp 99.1°F | Resp 18 | Ht 73.0 in | Wt 181.2 lb

## 2018-10-17 DIAGNOSIS — Z79899 Other long term (current) drug therapy: Secondary | ICD-10-CM

## 2018-10-17 DIAGNOSIS — D649 Anemia, unspecified: Secondary | ICD-10-CM

## 2018-10-17 DIAGNOSIS — D469 Myelodysplastic syndrome, unspecified: Secondary | ICD-10-CM

## 2018-10-17 DIAGNOSIS — D4621 Refractory anemia with excess of blasts 1: Secondary | ICD-10-CM

## 2018-10-17 DIAGNOSIS — Z7189 Other specified counseling: Secondary | ICD-10-CM

## 2018-10-17 LAB — COMPREHENSIVE METABOLIC PANEL
ALT: 16 U/L (ref 0–44)
AST: 18 U/L (ref 15–41)
Albumin: 4.3 g/dL (ref 3.5–5.0)
Alkaline Phosphatase: 31 U/L — ABNORMAL LOW (ref 38–126)
Anion gap: 9 (ref 5–15)
BUN: 19 mg/dL (ref 8–23)
CO2: 26 mmol/L (ref 22–32)
Calcium: 8.9 mg/dL (ref 8.9–10.3)
Chloride: 101 mmol/L (ref 98–111)
Creatinine, Ser: 0.7 mg/dL (ref 0.61–1.24)
GFR calc Af Amer: >60 mL/min (ref >60)
GFR calc non Af Amer: >60 mL/min (ref >60)
Glucose, Bld: 223 mg/dL — ABNORMAL HIGH (ref 70–99)
Potassium: 4.2 mmol/L (ref 3.5–5.1)
Sodium: 136 mmol/L (ref 135–145)
Total Bilirubin: 0.9 mg/dL (ref 0.3–1.2)
Total Protein: 7.2 g/dL (ref 6.5–8.1)

## 2018-10-17 LAB — CBC WITH DIFFERENTIAL/PLATELET
Abs Immature Granulocytes: 0.05 10*3/uL (ref 0.00–0.07)
Basophils Absolute: 0 10*3/uL (ref 0.0–0.1)
Basophils Relative: 0 %
Eosinophils Absolute: 0 10*3/uL (ref 0.0–0.5)
Eosinophils Relative: 0 %
HCT: 25.2 % — ABNORMAL LOW (ref 39.0–52.0)
Hemoglobin: 7.3 g/dL — ABNORMAL LOW (ref 13.0–17.0)
Immature Granulocytes: 1 %
Lymphocytes Relative: 37 %
Lymphs Abs: 1.5 10*3/uL (ref 0.7–4.0)
MCH: 26.3 pg (ref 26.0–34.0)
MCHC: 29 g/dL — ABNORMAL LOW (ref 30.0–36.0)
MCV: 90.6 fL (ref 80.0–100.0)
Monocytes Absolute: 0.6 10*3/uL (ref 0.1–1.0)
Monocytes Relative: 15 %
Neutro Abs: 1.9 10*3/uL (ref 1.7–7.7)
Neutrophils Relative %: 47 %
Platelets: 200 10*3/uL (ref 150–400)
RBC: 2.78 MIL/uL — ABNORMAL LOW (ref 4.22–5.81)
RDW: 18.1 % — ABNORMAL HIGH (ref 11.5–15.5)
WBC: 4 10*3/uL (ref 4.0–10.5)
nRBC: 0 % (ref 0.0–0.2)

## 2018-10-17 LAB — PREPARE RBC (CROSSMATCH)

## 2018-10-17 MED ORDER — DARBEPOETIN ALFA 300 MCG/0.6ML IJ SOSY
300.0000 ug | PREFILLED_SYRINGE | Freq: Once | INTRAMUSCULAR | Status: AC
  Administered 2018-10-17: 300 ug via SUBCUTANEOUS

## 2018-10-17 NOTE — Progress Notes (Signed)
No new changes noted today 

## 2018-10-17 NOTE — Patient Instructions (Signed)

## 2018-10-18 ENCOUNTER — Inpatient Hospital Stay: Payer: Medicare Other

## 2018-10-18 DIAGNOSIS — D469 Myelodysplastic syndrome, unspecified: Secondary | ICD-10-CM

## 2018-10-18 DIAGNOSIS — D4621 Refractory anemia with excess of blasts 1: Secondary | ICD-10-CM | POA: Diagnosis not present

## 2018-10-18 DIAGNOSIS — D649 Anemia, unspecified: Secondary | ICD-10-CM

## 2018-10-18 MED ORDER — ACETAMINOPHEN 325 MG PO TABS
650.0000 mg | ORAL_TABLET | Freq: Once | ORAL | Status: AC
  Administered 2018-10-18: 650 mg via ORAL
  Filled 2018-10-18: qty 2

## 2018-10-18 MED ORDER — SODIUM CHLORIDE 0.9% IV SOLUTION
250.0000 mL | Freq: Once | INTRAVENOUS | Status: AC
  Administered 2018-10-18: 250 mL via INTRAVENOUS
  Filled 2018-10-18: qty 250

## 2018-10-18 MED ORDER — DIPHENHYDRAMINE HCL 25 MG PO CAPS
25.0000 mg | ORAL_CAPSULE | Freq: Once | ORAL | Status: AC
  Administered 2018-10-18: 25 mg via ORAL
  Filled 2018-10-18: qty 1

## 2018-10-18 NOTE — Patient Instructions (Signed)
Blood Transfusion, Adult, Care After This sheet gives you information about how to care for yourself after your procedure. Your doctor may also give you more specific instructions. If you have problems or questions, contact your doctor. Follow these instructions at home:   Take over-the-counter and prescription medicines only as told by your doctor.  Go back to your normal activities as told by your doctor.  Follow instructions from your doctor about how to take care of the area where an IV tube was put into your vein (insertion site). Make sure you: ? Wash your hands with soap and water before you change your bandage (dressing). If there is no soap and water, use hand sanitizer. ? Change your bandage as told by your doctor.  Check your IV insertion site every day for signs of infection. Check for: ? More redness, swelling, or pain. ? More fluid or blood. ? Warmth. ? Pus or a bad smell. Contact a doctor if:  You have more redness, swelling, or pain around the IV insertion site.  You have more fluid or blood coming from the IV insertion site.  Your IV insertion site feels warm to the touch.  You have pus or a bad smell coming from the IV insertion site.  Your pee (urine) turns pink, red, or brown.  You feel weak after doing your normal activities. Get help right away if:  You have signs of a serious allergic or body defense (immune) system reaction, including: ? Itchiness. ? Hives. ? Trouble breathing. ? Anxiety. ? Pain in your chest or lower back. ? Fever, flushing, and chills. ? Fast pulse. ? Rash. ? Watery poop (diarrhea). ? Throwing up (vomiting). ? Dark pee. ? Serious headache. ? Dizziness. ? Stiff neck. ? Yellow color in your face or the white parts of your eyes (jaundice). Summary  After a blood transfusion, return to your normal activities as told by your doctor.  Every day, check for signs of infection where the IV tube was put into your vein.  Some  signs of infection are warm skin, more redness and pain, more fluid or blood, and pus or a bad smell where the needle went in.  Contact your doctor if you feel weak or have any unusual symptoms. This information is not intended to replace advice given to you by your health care provider. Make sure you discuss any questions you have with your health care provider. Document Released: 09/07/2014 Document Revised: 04/10/2016 Document Reviewed: 04/10/2016 Elsevier Interactive Patient Education  2019 Elsevier Inc.  

## 2018-10-19 LAB — BPAM RBC
Blood Product Expiration Date: 202003082359
ISSUE DATE / TIME: 202002181353
Unit Type and Rh: 1700

## 2018-10-19 LAB — TYPE AND SCREEN
ABO/RH(D): B POS
Antibody Screen: NEGATIVE
Unit division: 0

## 2018-10-23 NOTE — Progress Notes (Signed)
[facility] day:  10/24/2018  Chief Complaint: Paul Pham. is a 85 y.o. male with RAEB-1 who is seen for assessment prior to cycle #11 Viadaza.  HPI:   The patient was last seen in the hematology clinic on 10/17/2018.  At that time, he felt good.  Weight was down 5 pounds.  Exam was stable.  Hemoglobin was 7.3.  He received Aranesp.  He returned for 1 unit of PRBCs on 10/18/2018.  At last visit, we discussed plan for continuation of Vidaz every 5-6 weeks as tolerated.  We discussed continuation of Aranesp as it decreased RBC transfusion support.  During the interim, he has felt "pretty good".  He denies any chest pain or shortness of breath.  He denies any excess bruising or bleeding.  He denies any interval infections.   Past Medical History:  Diagnosis Date  . Anemia   . Arthritis   . BPH (benign prostatic hypertrophy)   . Complication of anesthesia    nausea  . Diabetes type 2, controlled ([place])   . HOH (hard of hearing)    r and L ears-70% loss per pt  . Hyperlipidemia   . Hypertension     Past Surgical History:  Procedure Laterality Date  . APPENDECTOMY    . COLONOSCOPY  09/21/08   1 polyp found, tubular adenoma  . HAND SURGERY    . KNEE SURGERY Left     Family History  Problem Relation Age of Onset  . Aneurysm Mother   . Heart disease Father   . Cancer Brother        skin  . Heart disease Brother   . Heart disease Brother   . Cancer Sister        skin  . Aneurysm Brother   . Heart disease Brother   . Heart disease Sister   . Heart disease Sister   . COPD Sister   . Diabetes Sister     Social History:  reports that he has never smoked. He has never used smokeless tobacco. He reports that he does not drink alcohol or use drugs. 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 Patient denies known exposures to radiation on toxins.  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  He had his 71-year marriage  anniversary on 08/23/2018.  The patient is accompanied by his wife today.  Allergies: No Known Allergies  Current Medications: Current Outpatient Medications  Medication Sig Dispense Refill  . Cranberry (THERACRAN PO) Take 1 tablet by mouth daily.     . cyanocobalamin 1000 MCG tablet Take 1,000 mcg by mouth daily.    . finasteride (PROSCAR) 5 MG tablet Take 5 mg by mouth daily.    Marland Kitchen glucose blood test strip FreeStyle Lite Strips    . LANCETS ULTRA FINE MISC Lancets,Ultra Thin 26 gauge    . lisinopril (PRINIVIL,ZESTRIL) 10 MG tablet Take 10 mg by mouth daily.    Marland Kitchen loperamide (IMODIUM) 2 MG capsule Take 1 capsule (2 mg total) by mouth See admin instructions. With onset of loose stool, take 4mg  followed by 2mg  every 2 hours until loose bowel movement stopped. Maximum: 16 mg/day 30 capsule 0  . meclizine (ANTIVERT) 25 MG tablet Take 25 mg by mouth 2 (two) times daily as needed.     . metformin (FORTAMET) 1000 MG (OSM) 24 hr tablet Take 1,000 mg by mouth 2 (two) times daily with a meal.    .  Multiple Vitamins-Minerals (CENTRUM SILVER PO) Take by mouth.    . Omega-3 Fatty Acids (FISH OIL PO) Take 1 tablet by mouth daily.     . Polyethylene Glycol 3350 (MIRALAX PO) Take by mouth.    [name] (PROSTATE SR PO) Take 1 tablet by mouth daily.     . simvastatin (ZOCOR) 40 MG tablet Take 40 mg by mouth daily.    . ondansetron (ZOFRAN) 4 MG tablet Take 1 tablet (4 mg total) by mouth every 6 (six) hours as needed for nausea or vomiting. (Patient not taking: Reported on 10/24/2018) 30 tablet 0   No current facility-administered medications for this visit.     Review of Systems:  GENERAL:  Feels "pretty good".  No fevers, sweats.  Weight down 1 pound. PERFORMANCE STATUS (ECOG):  1 HEENT:  Decreased hearing.  No visual changes, runny nose, sore throat, mouth sores or tenderness. Lungs: No shortness of breath or cough.  No hemoptysis. Cardiac:  No chest pain, palpitations, orthopnea, or  PND. GI:  Constipation and diarrhea.  7 bowel movements/week.  No nausea, vomiting, melena or hematochezia. GU:  No urgency, frequency, dysuria, or hematuria. Musculoskeletal:  No back pain.  No joint pain.  No muscle tenderness. Extremities:  No pain or swelling. Skin:  No rashes or skin changes. Neuro:  No headache, numbness or weakness, balance or coordination issues. Endocrine:  Diabetes.  No thyroid issues, hot flashes or night sweats. Psych:  No mood changes, depression or anxiety. Pain:  No focal pain. Review of systems:  All other systems reviewed and found to be negative.   Physical Exam: Blood pressure 128/65, pulse 74, temperature 97.8 F (36.6 C), temperature source Oral, resp. rate 16, weight 180 lb 1.9 oz (81.7 kg), SpO2 100 %. GENERAL:  Well developed, well nourished, elderly gentleman sitting comfortably in the exam room in no acute distress. MENTAL STATUS:  Alert and oriented to person, place and time. HEAD:  Near alopecia/male pattern baldness.  Gray hair.  Normocephalic, atraumatic, face symmetric, no Cushingoid features. EYES:  Pupils equal round and reactive to light and accomodation.  No conjunctivitis or scleral icterus. ENT:  Hearing aide.  Oropharynx clear without lesion.  Tongue normal. Mucous membranes moist.  RESPIRATORY:  Clear to auscultation without rales, wheezes or rhonchi. CARDIOVASCULAR:  Regular rate and rhythm without murmur, rub or gallop. ABDOMEN:  Soft, non-tender, with active bowel sounds, and no hepatosplenomegaly.  No masses. SKIN:  No rashes, ulcers or lesions. EXTREMITIES: No edema, no skin discoloration or tenderness.  No palpable cords. LYMPH NODES: No palpable cervical, supraclavicular, axillary or inguinal adenopathy  NEUROLOGICAL: Unremarkable. PSYCH:  Appropriate.    Appointment on 10/24/2018  Component Date Value Ref Range Status  . Sodium 10/24/2018 135  135 - 145 mmol/L Final  . Potassium 10/24/2018 4.4  3.5 - 5.1 mmol/L Final  .  Chloride 10/24/2018 99  98 - 111 mmol/L Final  . CO2 10/24/2018 27  22 - 32 mmol/L Final  . Glucose, Bld 10/24/2018 249* 70 - 99 mg/dL Final  . BUN 10/24/2018 20  8 - 23 mg/dL Final  . Creatinine, Ser 10/24/2018 0.77  0.61 - 1.24 mg/dL Final  . Calcium 10/24/2018 8.9  8.9 - 10.3 mg/dL Final  . Total Protein 10/24/2018 7.1  6.5 - 8.1 g/dL Final  . Albumin 10/24/2018 4.2  3.5 - 5.0 g/dL Final  . AST 10/24/2018 17  15 - 41 U/L Final  . ALT 10/24/2018 14  0 - 44  U/L Final  . Alkaline Phosphatase 10/24/2018 27* 38 - 126 U/L Final  . Total Bilirubin 10/24/2018 1.0  0.3 - 1.2 mg/dL Final  . GFR calc non Af Amer 10/24/2018 >60  >60 mL/min Final  . GFR calc Af Amer 10/24/2018 >60  >60 mL/min Final  . Anion gap 10/24/2018 9  5 - 15 Final   Performed at [facility], [address], [place], [place] [address]  . WBC 10/24/2018 3.2* 4.0 - 10.5 K/uL Final  . RBC 10/24/2018 2.97* 4.22 - 5.81 MIL/uL Final  . Hemoglobin 10/24/2018 7.9* 13.0 - 17.0 g/dL Final  . HCT 10/24/2018 26.9* 39.0 - 52.0 % Final  . MCV 10/24/2018 90.6  80.0 - 100.0 fL Final  . MCH 10/24/2018 26.6  26.0 - 34.0 pg Final  . MCHC 10/24/2018 29.4* 30.0 - 36.0 g/dL Final  . RDW 10/24/2018 18.4* 11.5 - 15.5 % Final  . Platelets 10/24/2018 193  150 - 400 K/uL Final  . nRBC 10/24/2018 0.0  0.0 - 0.2 % Final  . Neutrophils Relative % 10/24/2018 42  % Final  . Neutro Abs 10/24/2018 1.4* 1.7 - 7.7 K/uL Final  . Lymphocytes Relative 10/24/2018 40  % Final  . Lymphs Abs 10/24/2018 1.3  0.7 - 4.0 K/uL Final  . Monocytes Relative 10/24/2018 16  % Final  . Monocytes Absolute 10/24/2018 0.5  0.1 - 1.0 K/uL Final  . Eosinophils Relative 10/24/2018 0  % Final  . Eosinophils Absolute 10/24/2018 0.0  0.0 - 0.5 K/uL Final  . Basophils Relative 10/24/2018 0  % Final  . Basophils Absolute 10/24/2018 0.0  0.0 - 0.1 K/uL Final  . Immature Granulocytes 10/24/2018 2  % Final  . Abs Immature Granulocytes 10/24/2018 0.05  0.00 - 0.07 K/uL  Final   Performed at [facility], [address], [place], [place] [address]    Assessment:  Linn Goetze. is a 85 y.o. 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  Bone marrow on 09/22/2017 revealed a hypercellular for age with dyspoietic changes variably involving myeloid cell lines, but with main involvement of the granulocytic/monocytic cell line. This was associated with bone marrow monocytosis and borderline number to slight increase in blastic cells. The overall changes favor a primary myeloid neoplasm, particularly a myelodysplastic syndrome especially refractory anemia with excess blasts (RAEB-1) or possibly refractory cytopenia with multilineage dysplasia. Consideration was also given to an evolving myelodysplastic/myeloproliferative neoplasm such as chronic myelomonocytic leukemia but the lack of absolute peripheral monocytosis precluded such a diagnosis at this time. Flow cytometry was negative.  Cytogenetics were normal (46, XY). Foundation one was positive for ASXL1 U202RK*27, EZH2 Splice site 062-3J>S, RUNX1 [id number].  IPSS-R 4.5, intermediate group.  Anemia work-up on 08/17/2017: Vitamin B12 (658), folate (22.0), and TSH (2.688). Retic was 2.6%.  Haptoglobin was 116 on 08/19/2017.  Epo level was 104.8 on 10/05/2017.  Ferritin has been followed:  106 on 08/17/2017, 131 on 11/04/2017, 144 on 05/30/2018, and 394 on 09/01/2018.  Iron saturation was 34% on 08/17/2017 and 89% on 09/01/2018.  Bone marrow on 09/22/2017 revealed a hypercellular for age with dyspoietic changes variably involving myeloid cell lines, but with main involvement of the granulocytic/monocytic cell line. This was associated with bone marrow monocytosis and borderline number to slight increase in blasts (5%). The overall changes favor a primary myeloid neoplasm, particularly a myelodysplastic syndrome especially refractory anemia with excess blasts (RAEB-1) or possibly refractory cytopenia with multilineage dysplasia. Consideration  was also given to an evolving myelodysplastic/myeloproliferative  neoplasm such as chronic myelomonocytic leukemia but the lack of absolute peripheral monocytosis precluded such a diagnosis at this time. Flow cytometry was negative.  Cytogenetics were normal (46, XY). Foundation One was positive for ASXL1 M384YK*59, EZH2 Splice site 935-7S>V, RUNX1 [id number].  He has received 10 cycles of Vidaza (11/04/2017 - 08/22/2018).  He has received Aranesp (initially 150 mcg every 2 weeks post chemotherapy then 300 mcg every 1-2 weeks after cycle #3; last 09/19/2018).  He last received Aranesp on 10/17/2018.  He has received 1-2 units of PRBCs with each cycle (last 09/20/2018).  Bone marrow on 08/12/2018 revealed a hypercellular marrow with dyspoietic changes.  There was a borderline number of blasts (5%) seen by morphology. The findings were similar to previous biopsy although the cellularity is slightly higher in the current material.consistent with previously known myelodysplastic syndrome.  Flow cytometry was negative.  Cytogenetics were normal (46, XY).  Symptomatically, he feels "pretty good".  Exam is stable. Hemoglobin is 7.9.  Platelet count is 193,000.  WBC is 3200 with an ANC of 1400.  Plan: 1.   Labs today:  CBC with diff, CMP, hold tube.   2.   Refractory anemia with excess blasts (RAEB-1):  Review today's counts.  Review goal of therapy:  decrease transfusion dependence and prevent acceleration of disease (blasts).  Plan Vidaza every 5-6 weeks as tolerated.  Continue Aranesp every 2 weeks secondary to decrease transfusion dependence.  Begin cycle #11 Vidaza (day 1-5) today.  3.   RTC on 10/27/2018 add labs (CBC and type and screen) +/- PRBC tx on Friday, 10/28/2018. 4.   RTC on 10/31/2018 for labs (CBC with diff, hold tube), +/- Aranesp. 5.   RTC on 11/14/2018 for labs (CBC with diff, hold tube), +/- Aranesp. 6.   RTC on 11/28/2018 for MD assessment, labs (CBC with diff, CMP, hold tube),  Aranesp, and cycle #12 Vidaza (day 1-5).   Lequita Asal, MD  10/24/2018, 10:39 AM

## 2018-10-24 ENCOUNTER — Encounter: Payer: Self-pay | Admitting: Hematology and Oncology

## 2018-10-24 ENCOUNTER — Inpatient Hospital Stay (HOSPITAL_BASED_OUTPATIENT_CLINIC_OR_DEPARTMENT_OTHER): Payer: Medicare Other | Admitting: Hematology and Oncology

## 2018-10-24 ENCOUNTER — Inpatient Hospital Stay: Payer: Medicare Other

## 2018-10-24 VITALS — BP 128/65 | HR 74 | Temp 97.8°F | Resp 16 | Wt 180.1 lb

## 2018-10-24 DIAGNOSIS — Z7189 Other specified counseling: Secondary | ICD-10-CM | POA: Diagnosis not present

## 2018-10-24 DIAGNOSIS — D649 Anemia, unspecified: Secondary | ICD-10-CM

## 2018-10-24 DIAGNOSIS — Z5111 Encounter for antineoplastic chemotherapy: Secondary | ICD-10-CM

## 2018-10-24 DIAGNOSIS — D469 Myelodysplastic syndrome, unspecified: Secondary | ICD-10-CM

## 2018-10-24 DIAGNOSIS — D4621 Refractory anemia with excess of blasts 1: Secondary | ICD-10-CM | POA: Diagnosis not present

## 2018-10-24 LAB — CBC WITH DIFFERENTIAL/PLATELET
Abs Immature Granulocytes: 0.05 10*3/uL (ref 0.00–0.07)
Basophils Absolute: 0 10*3/uL (ref 0.0–0.1)
Basophils Relative: 0 %
Eosinophils Absolute: 0 10*3/uL (ref 0.0–0.5)
Eosinophils Relative: 0 %
HCT: 26.9 % — ABNORMAL LOW (ref 39.0–52.0)
Hemoglobin: 7.9 g/dL — ABNORMAL LOW (ref 13.0–17.0)
Immature Granulocytes: 2 %
Lymphocytes Relative: 40 %
Lymphs Abs: 1.3 10*3/uL (ref 0.7–4.0)
MCH: 26.6 pg (ref 26.0–34.0)
MCHC: 29.4 g/dL — ABNORMAL LOW (ref 30.0–36.0)
MCV: 90.6 fL (ref 80.0–100.0)
Monocytes Absolute: 0.5 10*3/uL (ref 0.1–1.0)
Monocytes Relative: 16 %
Neutro Abs: 1.4 10*3/uL — ABNORMAL LOW (ref 1.7–7.7)
Neutrophils Relative %: 42 %
Platelets: 193 10*3/uL (ref 150–400)
RBC: 2.97 MIL/uL — ABNORMAL LOW (ref 4.22–5.81)
RDW: 18.4 % — ABNORMAL HIGH (ref 11.5–15.5)
WBC: 3.2 10*3/uL — ABNORMAL LOW (ref 4.0–10.5)
nRBC: 0 % (ref 0.0–0.2)

## 2018-10-24 LAB — COMPREHENSIVE METABOLIC PANEL
ALT: 14 U/L (ref 0–44)
AST: 17 U/L (ref 15–41)
Albumin: 4.2 g/dL (ref 3.5–5.0)
Alkaline Phosphatase: 27 U/L — ABNORMAL LOW (ref 38–126)
Anion gap: 9 (ref 5–15)
BUN: 20 mg/dL (ref 8–23)
CO2: 27 mmol/L (ref 22–32)
Calcium: 8.9 mg/dL (ref 8.9–10.3)
Chloride: 99 mmol/L (ref 98–111)
Creatinine, Ser: 0.77 mg/dL (ref 0.61–1.24)
GFR calc Af Amer: >60 mL/min (ref >60)
GFR calc non Af Amer: >60 mL/min (ref >60)
Glucose, Bld: 249 mg/dL — ABNORMAL HIGH (ref 70–99)
Potassium: 4.4 mmol/L (ref 3.5–5.1)
Sodium: 135 mmol/L (ref 135–145)
Total Bilirubin: 1 mg/dL (ref 0.3–1.2)
Total Protein: 7.1 g/dL (ref 6.5–8.1)

## 2018-10-24 LAB — SAMPLE TO BLOOD BANK

## 2018-10-24 MED ORDER — ONDANSETRON HCL 4 MG PO TABS
8.0000 mg | ORAL_TABLET | Freq: Once | ORAL | Status: AC
  Administered 2018-10-24: 8 mg via ORAL

## 2018-10-24 MED ORDER — AZACITIDINE CHEMO SQ INJECTION
75.0000 mg/m2 | Freq: Once | INTRAMUSCULAR | Status: AC
  Administered 2018-10-24: 155 mg via SUBCUTANEOUS
  Filled 2018-10-24: qty 6.2

## 2018-10-24 NOTE — Progress Notes (Signed)
Pt here for follow up. Denies any concerns at this time.  

## 2018-10-24 NOTE — Patient Instructions (Signed)
Azacitidine suspension for injection (subcutaneous use)  What is this medicine?  AZACITIDINE (ay za SITE i deen) is a chemotherapy drug. This medicine reduces the growth of cancer cells and can suppress the immune system. It is used for treating myelodysplastic syndrome or some types of leukemia.  This medicine may be used for other purposes; ask your health care provider or pharmacist if you have questions.  COMMON BRAND NAME(S): Vidaza  What should I tell my health care provider before I take this medicine?  They need to know if you have any of these conditions:  -kidney disease  -liver disease  -liver tumors  -an unusual or allergic reaction to azacitidine, mannitol, other medicines, foods, dyes, or preservatives  -pregnant or trying to get pregnant  -breast-feeding  How should I use this medicine?  This medicine is for injection under the skin. It is administered in a hospital or clinic by a specially trained health care professional.  Talk to your pediatrician regarding the use of this medicine in children. While this drug may be prescribed for selected conditions, precautions do apply.  Overdosage: If you think you have taken too much of this medicine contact a poison control center or emergency room at once.  NOTE: This medicine is only for you. Do not share this medicine with others.  What if I miss a dose?  It is important not to miss your dose. Call your doctor or health care professional if you are unable to keep an appointment.  What may interact with this medicine?  Interactions have not been studied.  Give your health care provider a list of all the medicines, herbs, non-prescription drugs, or dietary supplements you use. Also tell them if you smoke, drink alcohol, or use illegal drugs. Some items may interact with your medicine.  This list may not describe all possible interactions. Give your health care provider a list of all the medicines, herbs, non-prescription drugs, or dietary supplements you  use. Also tell them if you smoke, drink alcohol, or use illegal drugs. Some items may interact with your medicine.  What should I watch for while using this medicine?  Visit your doctor for checks on your progress. This drug may make you feel generally unwell. This is not uncommon, as chemotherapy can affect healthy cells as well as cancer cells. Report any side effects. Continue your course of treatment even though you feel ill unless your doctor tells you to stop.  In some cases, you may be given additional medicines to help with side effects. Follow all directions for their use.  Call your doctor or health care professional for advice if you get a fever, chills or sore throat, or other symptoms of a cold or flu. Do not treat yourself. This drug decreases your body's ability to fight infections. Try to avoid being around people who are sick.  This medicine may increase your risk to bruise or bleed. Call your doctor or health care professional if you notice any unusual bleeding.  You may need blood work done while you are taking this medicine.  Do not become pregnant while taking this medicine and for 6 months after the last dose. Women should inform their doctor if they wish to become pregnant or think they might be pregnant. Men should not father a child while taking this medicine and for 3 months after the last dose. There is a potential for serious side effects to an unborn child. Talk to your health care professional or pharmacist for   more information. Do not breast-feed an infant while taking this medicine and for 1 week after the last dose.  This medicine may interfere with the ability to have a child. Talk with your doctor or health care professional if you are concerned about your fertility.  What side effects may I notice from receiving this medicine?  Side effects that you should report to your doctor or health care professional as soon as possible:  -allergic reactions like skin rash, itching or hives,  swelling of the face, lips, or tongue  -low blood counts - this medicine may decrease the number of white blood cells, red blood cells and platelets. You may be at increased risk for infections and bleeding.  -signs of infection - fever or chills, cough, sore throat, pain passing urine  -signs of decreased platelets or bleeding - bruising, pinpoint red spots on the skin, black, tarry stools, blood in the urine  -signs of decreased red blood cells - unusually weak or tired, fainting spells, lightheadedness  -signs and symptoms of kidney injury like trouble passing urine or change in the amount of urine  -signs and symptoms of liver injury like dark yellow or brown urine; general ill feeling or flu-like symptoms; light-colored stools; loss of appetite; nausea; right upper belly pain; unusually weak or tired; yellowing of the eyes or skin  Side effects that usually do not require medical attention (report to your doctor or health care professional if they continue or are bothersome):  -constipation  -diarrhea  -nausea, vomiting  -pain or redness at the injection site  -unusually weak or tired  This list may not describe all possible side effects. Call your doctor for medical advice about side effects. You may report side effects to FDA at 1-800-FDA-1088.  Where should I keep my medicine?  This drug is given in a hospital or clinic and will not be stored at home.  NOTE: This sheet is a summary. It may not cover all possible information. If you have questions about this medicine, talk to your doctor, pharmacist, or health care provider.   2019 Elsevier/Gold Standard (2016-09-15 14:37:51)

## 2018-10-25 ENCOUNTER — Inpatient Hospital Stay: Payer: Medicare Other

## 2018-10-25 VITALS — BP 124/64 | HR 69 | Temp 98.8°F | Resp 18

## 2018-10-25 DIAGNOSIS — D4621 Refractory anemia with excess of blasts 1: Secondary | ICD-10-CM | POA: Diagnosis not present

## 2018-10-25 DIAGNOSIS — D469 Myelodysplastic syndrome, unspecified: Secondary | ICD-10-CM

## 2018-10-25 MED ORDER — AZACITIDINE CHEMO SQ INJECTION
75.0000 mg/m2 | Freq: Once | INTRAMUSCULAR | Status: AC
  Administered 2018-10-25: 155 mg via SUBCUTANEOUS
  Filled 2018-10-25: qty 6.2

## 2018-10-25 MED ORDER — ONDANSETRON HCL 4 MG PO TABS
8.0000 mg | ORAL_TABLET | Freq: Once | ORAL | Status: AC
  Administered 2018-10-25: 8 mg via ORAL
  Filled 2018-10-25: qty 2

## 2018-10-25 NOTE — Patient Instructions (Signed)
Azacitidine suspension for injection (subcutaneous use)  What is this medicine?  AZACITIDINE (ay za SITE i deen) is a chemotherapy drug. This medicine reduces the growth of cancer cells and can suppress the immune system. It is used for treating myelodysplastic syndrome or some types of leukemia.  This medicine may be used for other purposes; ask your health care provider or pharmacist if you have questions.  COMMON BRAND NAME(S): Vidaza  What should I tell my health care provider before I take this medicine?  They need to know if you have any of these conditions:  -kidney disease  -liver disease  -liver tumors  -an unusual or allergic reaction to azacitidine, mannitol, other medicines, foods, dyes, or preservatives  -pregnant or trying to get pregnant  -breast-feeding  How should I use this medicine?  This medicine is for injection under the skin. It is administered in a hospital or clinic by a specially trained health care professional.  Talk to your pediatrician regarding the use of this medicine in children. While this drug may be prescribed for selected conditions, precautions do apply.  Overdosage: If you think you have taken too much of this medicine contact a poison control center or emergency room at once.  NOTE: This medicine is only for you. Do not share this medicine with others.  What if I miss a dose?  It is important not to miss your dose. Call your doctor or health care professional if you are unable to keep an appointment.  What may interact with this medicine?  Interactions have not been studied.  Give your health care provider a list of all the medicines, herbs, non-prescription drugs, or dietary supplements you use. Also tell them if you smoke, drink alcohol, or use illegal drugs. Some items may interact with your medicine.  This list may not describe all possible interactions. Give your health care provider a list of all the medicines, herbs, non-prescription drugs, or dietary supplements you  use. Also tell them if you smoke, drink alcohol, or use illegal drugs. Some items may interact with your medicine.  What should I watch for while using this medicine?  Visit your doctor for checks on your progress. This drug may make you feel generally unwell. This is not uncommon, as chemotherapy can affect healthy cells as well as cancer cells. Report any side effects. Continue your course of treatment even though you feel ill unless your doctor tells you to stop.  In some cases, you may be given additional medicines to help with side effects. Follow all directions for their use.  Call your doctor or health care professional for advice if you get a fever, chills or sore throat, or other symptoms of a cold or flu. Do not treat yourself. This drug decreases your body's ability to fight infections. Try to avoid being around people who are sick.  This medicine may increase your risk to bruise or bleed. Call your doctor or health care professional if you notice any unusual bleeding.  You may need blood work done while you are taking this medicine.  Do not become pregnant while taking this medicine and for 6 months after the last dose. Women should inform their doctor if they wish to become pregnant or think they might be pregnant. Men should not father a child while taking this medicine and for 3 months after the last dose. There is a potential for serious side effects to an unborn child. Talk to your health care professional or pharmacist for   more information. Do not breast-feed an infant while taking this medicine and for 1 week after the last dose.  This medicine may interfere with the ability to have a child. Talk with your doctor or health care professional if you are concerned about your fertility.  What side effects may I notice from receiving this medicine?  Side effects that you should report to your doctor or health care professional as soon as possible:  -allergic reactions like skin rash, itching or hives,  swelling of the face, lips, or tongue  -low blood counts - this medicine may decrease the number of white blood cells, red blood cells and platelets. You may be at increased risk for infections and bleeding.  -signs of infection - fever or chills, cough, sore throat, pain passing urine  -signs of decreased platelets or bleeding - bruising, pinpoint red spots on the skin, black, tarry stools, blood in the urine  -signs of decreased red blood cells - unusually weak or tired, fainting spells, lightheadedness  -signs and symptoms of kidney injury like trouble passing urine or change in the amount of urine  -signs and symptoms of liver injury like dark yellow or brown urine; general ill feeling or flu-like symptoms; light-colored stools; loss of appetite; nausea; right upper belly pain; unusually weak or tired; yellowing of the eyes or skin  Side effects that usually do not require medical attention (report to your doctor or health care professional if they continue or are bothersome):  -constipation  -diarrhea  -nausea, vomiting  -pain or redness at the injection site  -unusually weak or tired  This list may not describe all possible side effects. Call your doctor for medical advice about side effects. You may report side effects to FDA at 1-800-FDA-1088.  Where should I keep my medicine?  This drug is given in a hospital or clinic and will not be stored at home.  NOTE: This sheet is a summary. It may not cover all possible information. If you have questions about this medicine, talk to your doctor, pharmacist, or health care provider.   2019 Elsevier/Gold Standard (2016-09-15 14:37:51)

## 2018-10-26 ENCOUNTER — Inpatient Hospital Stay: Payer: Medicare Other | Attending: Hematology and Oncology

## 2018-10-26 VITALS — BP 118/47 | HR 70 | Temp 97.9°F | Resp 18

## 2018-10-26 DIAGNOSIS — Z5111 Encounter for antineoplastic chemotherapy: Secondary | ICD-10-CM | POA: Insufficient documentation

## 2018-10-26 DIAGNOSIS — D469 Myelodysplastic syndrome, unspecified: Secondary | ICD-10-CM

## 2018-10-26 DIAGNOSIS — Z79899 Other long term (current) drug therapy: Secondary | ICD-10-CM | POA: Insufficient documentation

## 2018-10-26 DIAGNOSIS — D4621 Refractory anemia with excess of blasts 1: Secondary | ICD-10-CM | POA: Diagnosis present

## 2018-10-26 MED ORDER — ONDANSETRON HCL 4 MG PO TABS
8.0000 mg | ORAL_TABLET | Freq: Once | ORAL | Status: AC
  Administered 2018-10-26: 8 mg via ORAL

## 2018-10-26 MED ORDER — ONDANSETRON HCL 4 MG PO TABS
ORAL_TABLET | ORAL | Status: AC
  Filled 2018-10-26: qty 2

## 2018-10-26 MED ORDER — AZACITIDINE CHEMO SQ INJECTION
75.0000 mg/m2 | Freq: Once | INTRAMUSCULAR | Status: AC
  Administered 2018-10-26: 155 mg via SUBCUTANEOUS
  Filled 2018-10-26: qty 6.2

## 2018-10-26 NOTE — Patient Instructions (Signed)
Azacitidine suspension for injection (subcutaneous use)  What is this medicine?  AZACITIDINE (ay za SITE i deen) is a chemotherapy drug. This medicine reduces the growth of cancer cells and can suppress the immune system. It is used for treating myelodysplastic syndrome or some types of leukemia.  This medicine may be used for other purposes; ask your health care provider or pharmacist if you have questions.  COMMON BRAND NAME(S): Vidaza  What should I tell my health care provider before I take this medicine?  They need to know if you have any of these conditions:  -kidney disease  -liver disease  -liver tumors  -an unusual or allergic reaction to azacitidine, mannitol, other medicines, foods, dyes, or preservatives  -pregnant or trying to get pregnant  -breast-feeding  How should I use this medicine?  This medicine is for injection under the skin. It is administered in a hospital or clinic by a specially trained health care professional.  Talk to your pediatrician regarding the use of this medicine in children. While this drug may be prescribed for selected conditions, precautions do apply.  Overdosage: If you think you have taken too much of this medicine contact a poison control center or emergency room at once.  NOTE: This medicine is only for you. Do not share this medicine with others.  What if I miss a dose?  It is important not to miss your dose. Call your doctor or health care professional if you are unable to keep an appointment.  What may interact with this medicine?  Interactions have not been studied.  Give your health care provider a list of all the medicines, herbs, non-prescription drugs, or dietary supplements you use. Also tell them if you smoke, drink alcohol, or use illegal drugs. Some items may interact with your medicine.  This list may not describe all possible interactions. Give your health care provider a list of all the medicines, herbs, non-prescription drugs, or dietary supplements you  use. Also tell them if you smoke, drink alcohol, or use illegal drugs. Some items may interact with your medicine.  What should I watch for while using this medicine?  Visit your doctor for checks on your progress. This drug may make you feel generally unwell. This is not uncommon, as chemotherapy can affect healthy cells as well as cancer cells. Report any side effects. Continue your course of treatment even though you feel ill unless your doctor tells you to stop.  In some cases, you may be given additional medicines to help with side effects. Follow all directions for their use.  Call your doctor or health care professional for advice if you get a fever, chills or sore throat, or other symptoms of a cold or flu. Do not treat yourself. This drug decreases your body's ability to fight infections. Try to avoid being around people who are sick.  This medicine may increase your risk to bruise or bleed. Call your doctor or health care professional if you notice any unusual bleeding.  You may need blood work done while you are taking this medicine.  Do not become pregnant while taking this medicine and for 6 months after the last dose. Women should inform their doctor if they wish to become pregnant or think they might be pregnant. Men should not father a child while taking this medicine and for 3 months after the last dose. There is a potential for serious side effects to an unborn child. Talk to your health care professional or pharmacist for   more information. Do not breast-feed an infant while taking this medicine and for 1 week after the last dose.  This medicine may interfere with the ability to have a child. Talk with your doctor or health care professional if you are concerned about your fertility.  What side effects may I notice from receiving this medicine?  Side effects that you should report to your doctor or health care professional as soon as possible:  -allergic reactions like skin rash, itching or hives,  swelling of the face, lips, or tongue  -low blood counts - this medicine may decrease the number of white blood cells, red blood cells and platelets. You may be at increased risk for infections and bleeding.  -signs of infection - fever or chills, cough, sore throat, pain passing urine  -signs of decreased platelets or bleeding - bruising, pinpoint red spots on the skin, black, tarry stools, blood in the urine  -signs of decreased red blood cells - unusually weak or tired, fainting spells, lightheadedness  -signs and symptoms of kidney injury like trouble passing urine or change in the amount of urine  -signs and symptoms of liver injury like dark yellow or brown urine; general ill feeling or flu-like symptoms; light-colored stools; loss of appetite; nausea; right upper belly pain; unusually weak or tired; yellowing of the eyes or skin  Side effects that usually do not require medical attention (report to your doctor or health care professional if they continue or are bothersome):  -constipation  -diarrhea  -nausea, vomiting  -pain or redness at the injection site  -unusually weak or tired  This list may not describe all possible side effects. Call your doctor for medical advice about side effects. You may report side effects to FDA at 1-800-FDA-1088.  Where should I keep my medicine?  This drug is given in a hospital or clinic and will not be stored at home.  NOTE: This sheet is a summary. It may not cover all possible information. If you have questions about this medicine, talk to your doctor, pharmacist, or health care provider.   2019 Elsevier/Gold Standard (2016-09-15 14:37:51)

## 2018-10-27 ENCOUNTER — Inpatient Hospital Stay: Payer: Medicare Other

## 2018-10-27 ENCOUNTER — Encounter: Payer: Self-pay | Admitting: Urgent Care

## 2018-10-27 ENCOUNTER — Other Ambulatory Visit: Payer: Self-pay | Admitting: Urgent Care

## 2018-10-27 VITALS — BP 124/63 | HR 87 | Temp 96.6°F | Resp 18

## 2018-10-27 DIAGNOSIS — D649 Anemia, unspecified: Secondary | ICD-10-CM

## 2018-10-27 DIAGNOSIS — D469 Myelodysplastic syndrome, unspecified: Secondary | ICD-10-CM

## 2018-10-27 DIAGNOSIS — D630 Anemia in neoplastic disease: Secondary | ICD-10-CM

## 2018-10-27 DIAGNOSIS — D4621 Refractory anemia with excess of blasts 1: Secondary | ICD-10-CM | POA: Diagnosis not present

## 2018-10-27 DIAGNOSIS — Z5111 Encounter for antineoplastic chemotherapy: Secondary | ICD-10-CM | POA: Diagnosis not present

## 2018-10-27 LAB — CBC WITH DIFFERENTIAL/PLATELET
Abs Immature Granulocytes: 0.16 10*3/uL — ABNORMAL HIGH (ref 0.00–0.07)
Basophils Absolute: 0 10*3/uL (ref 0.0–0.1)
Basophils Relative: 0 %
Eosinophils Absolute: 0 10*3/uL (ref 0.0–0.5)
Eosinophils Relative: 0 %
HCT: 25.2 % — ABNORMAL LOW (ref 39.0–52.0)
Hemoglobin: 7.4 g/dL — ABNORMAL LOW (ref 13.0–17.0)
Immature Granulocytes: 4 %
Lymphocytes Relative: 34 %
Lymphs Abs: 1.4 10*3/uL (ref 0.7–4.0)
MCH: 26.7 pg (ref 26.0–34.0)
MCHC: 29.4 g/dL — ABNORMAL LOW (ref 30.0–36.0)
MCV: 91 fL (ref 80.0–100.0)
Monocytes Absolute: 0.7 10*3/uL (ref 0.1–1.0)
Monocytes Relative: 17 %
Neutro Abs: 1.9 10*3/uL (ref 1.7–7.7)
Neutrophils Relative %: 45 %
Platelets: 172 10*3/uL (ref 150–400)
RBC: 2.77 MIL/uL — ABNORMAL LOW (ref 4.22–5.81)
RDW: 18.4 % — ABNORMAL HIGH (ref 11.5–15.5)
WBC: 4.2 10*3/uL (ref 4.0–10.5)
nRBC: 0 % (ref 0.0–0.2)

## 2018-10-27 LAB — SAMPLE TO BLOOD BANK

## 2018-10-27 LAB — PREPARE RBC (CROSSMATCH): Order Confirmation: POSITIVE

## 2018-10-27 MED ORDER — AZACITIDINE CHEMO SQ INJECTION
75.0000 mg/m2 | Freq: Once | INTRAMUSCULAR | Status: AC
  Administered 2018-10-27: 155 mg via SUBCUTANEOUS
  Filled 2018-10-27: qty 6.2

## 2018-10-27 MED ORDER — ONDANSETRON HCL 4 MG PO TABS
8.0000 mg | ORAL_TABLET | Freq: Once | ORAL | Status: AC
  Administered 2018-10-27: 8 mg via ORAL

## 2018-10-27 NOTE — Patient Instructions (Signed)
Azacitidine suspension for injection (subcutaneous use)  What is this medicine?  AZACITIDINE (ay za SITE i deen) is a chemotherapy drug. This medicine reduces the growth of cancer cells and can suppress the immune system. It is used for treating myelodysplastic syndrome or some types of leukemia.  This medicine may be used for other purposes; ask your health care provider or pharmacist if you have questions.  COMMON BRAND NAME(S): Vidaza  What should I tell my health care provider before I take this medicine?  They need to know if you have any of these conditions:  -kidney disease  -liver disease  -liver tumors  -an unusual or allergic reaction to azacitidine, mannitol, other medicines, foods, dyes, or preservatives  -pregnant or trying to get pregnant  -breast-feeding  How should I use this medicine?  This medicine is for injection under the skin. It is administered in a hospital or clinic by a specially trained health care professional.  Talk to your pediatrician regarding the use of this medicine in children. While this drug may be prescribed for selected conditions, precautions do apply.  Overdosage: If you think you have taken too much of this medicine contact a poison control center or emergency room at once.  NOTE: This medicine is only for you. Do not share this medicine with others.  What if I miss a dose?  It is important not to miss your dose. Call your doctor or health care professional if you are unable to keep an appointment.  What may interact with this medicine?  Interactions have not been studied.  Give your health care provider a list of all the medicines, herbs, non-prescription drugs, or dietary supplements you use. Also tell them if you smoke, drink alcohol, or use illegal drugs. Some items may interact with your medicine.  This list may not describe all possible interactions. Give your health care provider a list of all the medicines, herbs, non-prescription drugs, or dietary supplements you  use. Also tell them if you smoke, drink alcohol, or use illegal drugs. Some items may interact with your medicine.  What should I watch for while using this medicine?  Visit your doctor for checks on your progress. This drug may make you feel generally unwell. This is not uncommon, as chemotherapy can affect healthy cells as well as cancer cells. Report any side effects. Continue your course of treatment even though you feel ill unless your doctor tells you to stop.  In some cases, you may be given additional medicines to help with side effects. Follow all directions for their use.  Call your doctor or health care professional for advice if you get a fever, chills or sore throat, or other symptoms of a cold or flu. Do not treat yourself. This drug decreases your body's ability to fight infections. Try to avoid being around people who are sick.  This medicine may increase your risk to bruise or bleed. Call your doctor or health care professional if you notice any unusual bleeding.  You may need blood work done while you are taking this medicine.  Do not become pregnant while taking this medicine and for 6 months after the last dose. Women should inform their doctor if they wish to become pregnant or think they might be pregnant. Men should not father a child while taking this medicine and for 3 months after the last dose. There is a potential for serious side effects to an unborn child. Talk to your health care professional or pharmacist for   more information. Do not breast-feed an infant while taking this medicine and for 1 week after the last dose.  This medicine may interfere with the ability to have a child. Talk with your doctor or health care professional if you are concerned about your fertility.  What side effects may I notice from receiving this medicine?  Side effects that you should report to your doctor or health care professional as soon as possible:  -allergic reactions like skin rash, itching or hives,  swelling of the face, lips, or tongue  -low blood counts - this medicine may decrease the number of white blood cells, red blood cells and platelets. You may be at increased risk for infections and bleeding.  -signs of infection - fever or chills, cough, sore throat, pain passing urine  -signs of decreased platelets or bleeding - bruising, pinpoint red spots on the skin, black, tarry stools, blood in the urine  -signs of decreased red blood cells - unusually weak or tired, fainting spells, lightheadedness  -signs and symptoms of kidney injury like trouble passing urine or change in the amount of urine  -signs and symptoms of liver injury like dark yellow or brown urine; general ill feeling or flu-like symptoms; light-colored stools; loss of appetite; nausea; right upper belly pain; unusually weak or tired; yellowing of the eyes or skin  Side effects that usually do not require medical attention (report to your doctor or health care professional if they continue or are bothersome):  -constipation  -diarrhea  -nausea, vomiting  -pain or redness at the injection site  -unusually weak or tired  This list may not describe all possible side effects. Call your doctor for medical advice about side effects. You may report side effects to FDA at 1-800-FDA-1088.  Where should I keep my medicine?  This drug is given in a hospital or clinic and will not be stored at home.  NOTE: This sheet is a summary. It may not cover all possible information. If you have questions about this medicine, talk to your doctor, pharmacist, or health care provider.   2019 Elsevier/Gold Standard (2016-09-15 14:37:51)

## 2018-10-27 NOTE — Progress Notes (Signed)
  [facility] [address] [place], [place] [address] Phone: [phone] Fax: [phone]   Date: 10/27/18  Name: Paul Pham. DOB: 1926-08-31 MRN: [id number]  Re: Lab review  Patient here for day 4 azacitidine and routine lab monitoring today. Patient received Aranesp 300 mcg on 10/17/2018.   Appointment on 10/27/2018  Component Date Value Ref Range Status  . WBC 10/27/2018 4.2  4.0 - 10.5 K/uL Final  . RBC 10/27/2018 2.77* 4.22 - 5.81 MIL/uL Final  . Hemoglobin 10/27/2018 7.4* 13.0 - 17.0 g/dL Final  . HCT 10/27/2018 25.2* 39.0 - 52.0 % Final  . MCV 10/27/2018 91.0  80.0 - 100.0 fL Final  . MCH 10/27/2018 26.7  26.0 - 34.0 pg Final  . MCHC 10/27/2018 29.4* 30.0 - 36.0 g/dL Final  . RDW 10/27/2018 18.4* 11.5 - 15.5 % Final  . Platelets 10/27/2018 172  150 - 400 K/uL Final  . nRBC 10/27/2018 0.0  0.0 - 0.2 % Final  . Neutrophils Relative % 10/27/2018 45  % Final  . Neutro Abs 10/27/2018 1.9  1.7 - 7.7 K/uL Final  . Lymphocytes Relative 10/27/2018 34  % Final  . Lymphs Abs 10/27/2018 1.4  0.7 - 4.0 K/uL Final  . Monocytes Relative 10/27/2018 17  % Final  . Monocytes Absolute 10/27/2018 0.7  0.1 - 1.0 K/uL Final  . Eosinophils Relative 10/27/2018 0  % Final  . Eosinophils Absolute 10/27/2018 0.0  0.0 - 0.5 K/uL Final  . Basophils Relative 10/27/2018 0  % Final  . Basophils Absolute 10/27/2018 0.0  0.0 - 0.1 K/uL Final  . Immature Granulocytes 10/27/2018 4  % Final  . Abs Immature Granulocytes 10/27/2018 0.16* 0.00 - 0.07 K/uL Final   Performed at [facility], [address] [place], [place] [address]    Plans: 1. Chemotherapy induced anemia  Hgb low at 7.4. Ancipitate further decline secondary to chemotherapy.   T&S ordered and patient X-matched for 1 unit.  Orders entered for 1 unit of irradiated PRBCs to be transfused on 10/28/2018 in [place].  Blood bank has been notified of need for single unit PRBC in [place]  clinic on 10/28/2018.  RTC on 10/31/2018 and 11/14/2018 for labs and Aranesp injection.  Next cycle of chemotherapy is due on 11/28/2018.   Patient will receive Aranesp this day as well.   Honor Loh, MSN, APRN, FNP-C, CEN Oncology/Hematology Nurse Practitioner  [place] 10/27/18, 1:36 PM

## 2018-10-28 ENCOUNTER — Inpatient Hospital Stay: Payer: Medicare Other | Attending: Hematology and Oncology

## 2018-10-28 VITALS — BP 125/61 | HR 65 | Temp 98.3°F | Resp 18

## 2018-10-28 DIAGNOSIS — Z79899 Other long term (current) drug therapy: Secondary | ICD-10-CM | POA: Insufficient documentation

## 2018-10-28 DIAGNOSIS — D649 Anemia, unspecified: Secondary | ICD-10-CM

## 2018-10-28 DIAGNOSIS — D469 Myelodysplastic syndrome, unspecified: Secondary | ICD-10-CM | POA: Insufficient documentation

## 2018-10-28 DIAGNOSIS — Z5111 Encounter for antineoplastic chemotherapy: Secondary | ICD-10-CM | POA: Insufficient documentation

## 2018-10-28 DIAGNOSIS — D4621 Refractory anemia with excess of blasts 1: Secondary | ICD-10-CM | POA: Insufficient documentation

## 2018-10-28 DIAGNOSIS — D630 Anemia in neoplastic disease: Secondary | ICD-10-CM

## 2018-10-28 MED ORDER — ACETAMINOPHEN 325 MG PO TABS
650.0000 mg | ORAL_TABLET | Freq: Once | ORAL | Status: AC
  Administered 2018-10-28: 650 mg via ORAL
  Filled 2018-10-28: qty 2

## 2018-10-28 MED ORDER — ONDANSETRON HCL 4 MG PO TABS
8.0000 mg | ORAL_TABLET | Freq: Once | ORAL | Status: AC
  Administered 2018-10-28: 8 mg via ORAL
  Filled 2018-10-28: qty 2

## 2018-10-28 MED ORDER — DIPHENHYDRAMINE HCL 25 MG PO CAPS
25.0000 mg | ORAL_CAPSULE | Freq: Once | ORAL | Status: AC
  Administered 2018-10-28: 25 mg via ORAL
  Filled 2018-10-28: qty 1

## 2018-10-28 MED ORDER — SODIUM CHLORIDE 0.9% IV SOLUTION
250.0000 mL | Freq: Once | INTRAVENOUS | Status: AC
  Administered 2018-10-28: 250 mL via INTRAVENOUS
  Filled 2018-10-28: qty 250

## 2018-10-28 MED ORDER — AZACITIDINE CHEMO SQ INJECTION
75.0000 mg/m2 | Freq: Once | INTRAMUSCULAR | Status: AC
  Administered 2018-10-28: 155 mg via SUBCUTANEOUS
  Filled 2018-10-28: qty 6.2

## 2018-10-28 NOTE — Patient Instructions (Signed)
Blood Transfusion, Adult, Care After This sheet gives you information about how to care for yourself after your procedure. Your doctor may also give you more specific instructions. If you have problems or questions, contact your doctor. Follow these instructions at home:   Take over-the-counter and prescription medicines only as told by your doctor.  Go back to your normal activities as told by your doctor.  Follow instructions from your doctor about how to take care of the area where an IV tube was put into your vein (insertion site). Make sure you: ? Wash your hands with soap and water before you change your bandage (dressing). If there is no soap and water, use hand sanitizer. ? Change your bandage as told by your doctor.  Check your IV insertion site every day for signs of infection. Check for: ? More redness, swelling, or pain. ? More fluid or blood. ? Warmth. ? Pus or a bad smell. Contact a doctor if:  You have more redness, swelling, or pain around the IV insertion site.  You have more fluid or blood coming from the IV insertion site.  Your IV insertion site feels warm to the touch.  You have pus or a bad smell coming from the IV insertion site.  Your pee (urine) turns pink, red, or brown.  You feel weak after doing your normal activities. Get help right away if:  You have signs of a serious allergic or body defense (immune) system reaction, including: ? Itchiness. ? Hives. ? Trouble breathing. ? Anxiety. ? Pain in your chest or lower back. ? Fever, flushing, and chills. ? Fast pulse. ? Rash. ? Watery poop (diarrhea). ? Throwing up (vomiting). ? Dark pee. ? Serious headache. ? Dizziness. ? Stiff neck. ? Yellow color in your face or the white parts of your eyes (jaundice). Summary  After a blood transfusion, return to your normal activities as told by your doctor.  Every day, check for signs of infection where the IV tube was put into your vein.  Some  signs of infection are warm skin, more redness and pain, more fluid or blood, and pus or a bad smell where the needle went in.  Contact your doctor if you feel weak or have any unusual symptoms. This information is not intended to replace advice given to you by your health care provider. Make sure you discuss any questions you have with your health care provider. Document Released: 09/07/2014 Document Revised: 04/10/2016 Document Reviewed: 04/10/2016 Elsevier Interactive Patient Education  2019 [place]. Azacitidine suspension for injection (subcutaneous use) What is this medicine? AZACITIDINE (ay Lockhart) is a chemotherapy drug. This medicine reduces the growth of cancer cells and can suppress the immune system. It is used for treating myelodysplastic syndrome or some types of leukemia. This medicine may be used for other purposes; ask your health care provider or pharmacist if you have questions. COMMON BRAND NAME(S): Vidaza What should I tell my health care provider before I take this medicine? They need to know if you have any of these conditions: -kidney disease -liver disease -liver tumors -an unusual or allergic reaction to azacitidine, mannitol, other medicines, foods, dyes, or preservatives -pregnant or trying to get pregnant -breast-feeding How should I use this medicine? This medicine is for injection under the skin. It is administered in a hospital or clinic by a specially trained health care professional. Talk to your pediatrician regarding the use of this medicine in children. While this drug may be prescribed  for selected conditions, precautions do apply. Overdosage: If you think you have taken too much of this medicine contact a poison control center or emergency room at once. NOTE: This medicine is only for you. Do not share this medicine with others. What if I miss a dose? It is important not to miss your dose. Call your doctor or health care professional if you  are unable to keep an appointment. What may interact with this medicine? Interactions have not been studied. Give your health care provider a list of all the medicines, herbs, non-prescription drugs, or dietary supplements you use. Also tell them if you smoke, drink alcohol, or use illegal drugs. Some items may interact with your medicine. This list may not describe all possible interactions. Give your health care provider a list of all the medicines, herbs, non-prescription drugs, or dietary supplements you use. Also tell them if you smoke, drink alcohol, or use illegal drugs. Some items may interact with your medicine. What should I watch for while using this medicine? Visit your doctor for checks on your progress. This drug may make you feel generally unwell. This is not uncommon, as chemotherapy can affect healthy cells as well as cancer cells. Report any side effects. Continue your course of treatment even though you feel ill unless your doctor tells you to stop. In some cases, you may be given additional medicines to help with side effects. Follow all directions for their use. Call your doctor or health care professional for advice if you get a fever, chills or sore throat, or other symptoms of a cold or flu. Do not treat yourself. This drug decreases your body's ability to fight infections. Try to avoid being around people who are sick. This medicine may increase your risk to bruise or bleed. Call your doctor or health care professional if you notice any unusual bleeding. You may need blood work done while you are taking this medicine. Do not become pregnant while taking this medicine and for 6 months after the last dose. Women should inform their doctor if they wish to become pregnant or think they might be pregnant. Men should not father a child while taking this medicine and for 3 months after the last dose. There is a potential for serious side effects to an unborn child. Talk to your health  care professional or pharmacist for more information. Do not breast-feed an infant while taking this medicine and for 1 week after the last dose. This medicine may interfere with the ability to have a child. Talk with your doctor or health care professional if you are concerned about your fertility. What side effects may I notice from receiving this medicine? Side effects that you should report to your doctor or health care professional as soon as possible: -allergic reactions like skin rash, itching or hives, swelling of the face, lips, or tongue -low blood counts - this medicine may decrease the number of white blood cells, red blood cells and platelets. You may be at increased risk for infections and bleeding. -signs of infection - fever or chills, cough, sore throat, pain passing urine -signs of decreased platelets or bleeding - bruising, pinpoint red spots on the skin, black, tarry stools, blood in the urine -signs of decreased red blood cells - unusually weak or tired, fainting spells, lightheadedness -signs and symptoms of kidney injury like trouble passing urine or change in the amount of urine -signs and symptoms of liver injury like dark yellow or brown urine; general ill feeling  or flu-like symptoms; light-colored stools; loss of appetite; nausea; right upper belly pain; unusually weak or tired; yellowing of the eyes or skin Side effects that usually do not require medical attention (report to your doctor or health care professional if they continue or are bothersome): -constipation -diarrhea -nausea, vomiting -pain or redness at the injection site -unusually weak or tired This list may not describe all possible side effects. Call your doctor for medical advice about side effects. You may report side effects to FDA at 1-800-FDA-1088. Where should I keep my medicine? This drug is given in a hospital or clinic and will not be stored at home. NOTE: This sheet is a summary. It may not  cover all possible information. If you have questions about this medicine, talk to your doctor, pharmacist, or health care provider.  2019 Elsevier/Gold Standard (2016-09-15 14:37:51)

## 2018-10-29 LAB — TYPE AND SCREEN
ABO/RH(D): B POS
Antibody Screen: NEGATIVE
Unit division: 0

## 2018-10-29 LAB — BPAM RBC
Blood Product Expiration Date: 202003152359
ISSUE DATE / TIME: 202002281413
UNIT TYPE AND RH: 5100

## 2018-10-31 ENCOUNTER — Inpatient Hospital Stay: Payer: Medicare Other

## 2018-10-31 ENCOUNTER — Inpatient Hospital Stay: Payer: Medicare Other | Attending: Hematology and Oncology

## 2018-10-31 ENCOUNTER — Other Ambulatory Visit: Payer: Self-pay | Admitting: Urgent Care

## 2018-10-31 VITALS — BP 120/72 | HR 72 | Temp 97.0°F | Resp 18

## 2018-10-31 DIAGNOSIS — D630 Anemia in neoplastic disease: Secondary | ICD-10-CM

## 2018-10-31 DIAGNOSIS — Z79899 Other long term (current) drug therapy: Secondary | ICD-10-CM | POA: Insufficient documentation

## 2018-10-31 DIAGNOSIS — Z5111 Encounter for antineoplastic chemotherapy: Secondary | ICD-10-CM | POA: Diagnosis not present

## 2018-10-31 DIAGNOSIS — D469 Myelodysplastic syndrome, unspecified: Secondary | ICD-10-CM

## 2018-10-31 DIAGNOSIS — D4621 Refractory anemia with excess of blasts 1: Secondary | ICD-10-CM | POA: Insufficient documentation

## 2018-10-31 LAB — CBC WITH DIFFERENTIAL/PLATELET
Abs Immature Granulocytes: 0.18 10*3/uL — ABNORMAL HIGH (ref 0.00–0.07)
Basophils Absolute: 0 10*3/uL (ref 0.0–0.1)
Basophils Relative: 0 %
Eosinophils Absolute: 0 10*3/uL (ref 0.0–0.5)
Eosinophils Relative: 0 %
HCT: 27.2 % — ABNORMAL LOW (ref 39.0–52.0)
Hemoglobin: 8.2 g/dL — ABNORMAL LOW (ref 13.0–17.0)
Immature Granulocytes: 4 %
Lymphocytes Relative: 39 %
Lymphs Abs: 1.7 10*3/uL (ref 0.7–4.0)
MCH: 26.9 pg (ref 26.0–34.0)
MCHC: 30.1 g/dL (ref 30.0–36.0)
MCV: 89.2 fL (ref 80.0–100.0)
Monocytes Absolute: 0.5 10*3/uL (ref 0.1–1.0)
Monocytes Relative: 12 %
Neutro Abs: 2 10*3/uL (ref 1.7–7.7)
Neutrophils Relative %: 45 %
Platelets: 163 10*3/uL (ref 150–400)
RBC: 3.05 MIL/uL — AB (ref 4.22–5.81)
RDW: 17.2 % — ABNORMAL HIGH (ref 11.5–15.5)
WBC: 4.4 10*3/uL (ref 4.0–10.5)
nRBC: 0 % (ref 0.0–0.2)

## 2018-10-31 MED ORDER — DARBEPOETIN ALFA 300 MCG/0.6ML IJ SOSY
300.0000 ug | PREFILLED_SYRINGE | Freq: Once | INTRAMUSCULAR | Status: AC
  Administered 2018-10-31: 300 ug via SUBCUTANEOUS

## 2018-10-31 NOTE — Patient Instructions (Signed)

## 2018-11-14 ENCOUNTER — Other Ambulatory Visit: Payer: Self-pay

## 2018-11-14 ENCOUNTER — Inpatient Hospital Stay: Payer: Medicare Other

## 2018-11-14 ENCOUNTER — Other Ambulatory Visit: Payer: Self-pay | Admitting: Urgent Care

## 2018-11-14 VITALS — BP 147/58 | HR 76 | Temp 98.5°F | Resp 18

## 2018-11-14 DIAGNOSIS — D469 Myelodysplastic syndrome, unspecified: Secondary | ICD-10-CM

## 2018-11-14 DIAGNOSIS — D630 Anemia in neoplastic disease: Secondary | ICD-10-CM

## 2018-11-14 DIAGNOSIS — D649 Anemia, unspecified: Secondary | ICD-10-CM

## 2018-11-14 DIAGNOSIS — Z5111 Encounter for antineoplastic chemotherapy: Secondary | ICD-10-CM | POA: Diagnosis not present

## 2018-11-14 LAB — SAMPLE TO BLOOD BANK

## 2018-11-14 LAB — CBC WITH DIFFERENTIAL/PLATELET
Abs Immature Granulocytes: 0.06 10*3/uL (ref 0.00–0.07)
Basophils Absolute: 0 10*3/uL (ref 0.0–0.1)
Basophils Relative: 0 %
Eosinophils Absolute: 0 10*3/uL (ref 0.0–0.5)
Eosinophils Relative: 0 %
HCT: 23.6 % — ABNORMAL LOW (ref 39.0–52.0)
Hemoglobin: 7.1 g/dL — ABNORMAL LOW (ref 13.0–17.0)
Immature Granulocytes: 2 %
Lymphocytes Relative: 40 %
Lymphs Abs: 1.4 10*3/uL (ref 0.7–4.0)
MCH: 27.1 pg (ref 26.0–34.0)
MCHC: 30.1 g/dL (ref 30.0–36.0)
MCV: 90.1 fL (ref 80.0–100.0)
Monocytes Absolute: 0.3 10*3/uL (ref 0.1–1.0)
Monocytes Relative: 9 %
Neutro Abs: 1.8 10*3/uL (ref 1.7–7.7)
Neutrophils Relative %: 49 %
Platelets: 121 10*3/uL — ABNORMAL LOW (ref 150–400)
RBC: 2.62 MIL/uL — ABNORMAL LOW (ref 4.22–5.81)
RDW: 18.7 % — ABNORMAL HIGH (ref 11.5–15.5)
WBC: 3.5 10*3/uL — ABNORMAL LOW (ref 4.0–10.5)
nRBC: 0 % (ref 0.0–0.2)

## 2018-11-14 LAB — PREPARE RBC (CROSSMATCH)

## 2018-11-14 MED ORDER — DARBEPOETIN ALFA 300 MCG/0.6ML IJ SOSY
300.0000 ug | PREFILLED_SYRINGE | Freq: Once | INTRAMUSCULAR | Status: AC
  Administered 2018-11-14: 300 ug via SUBCUTANEOUS

## 2018-11-16 ENCOUNTER — Inpatient Hospital Stay: Payer: Medicare Other

## 2018-11-16 ENCOUNTER — Other Ambulatory Visit: Payer: Self-pay

## 2018-11-16 DIAGNOSIS — D630 Anemia in neoplastic disease: Secondary | ICD-10-CM

## 2018-11-16 DIAGNOSIS — Z5111 Encounter for antineoplastic chemotherapy: Secondary | ICD-10-CM | POA: Diagnosis not present

## 2018-11-16 MED ORDER — SODIUM CHLORIDE 0.9% IV SOLUTION
250.0000 mL | Freq: Once | INTRAVENOUS | Status: AC
  Administered 2018-11-16: 250 mL via INTRAVENOUS
  Filled 2018-11-16: qty 250

## 2018-11-16 MED ORDER — ACETAMINOPHEN 325 MG PO TABS
650.0000 mg | ORAL_TABLET | Freq: Once | ORAL | Status: AC
  Administered 2018-11-16: 650 mg via ORAL
  Filled 2018-11-16: qty 2

## 2018-11-16 MED ORDER — DIPHENHYDRAMINE HCL 25 MG PO CAPS
25.0000 mg | ORAL_CAPSULE | Freq: Once | ORAL | Status: AC
  Administered 2018-11-16: 25 mg via ORAL
  Filled 2018-11-16: qty 1

## 2018-11-17 LAB — TYPE AND SCREEN
ABO/RH(D): B POS
Antibody Screen: NEGATIVE
Unit division: 0

## 2018-11-17 LAB — BPAM RBC
Blood Product Expiration Date: 202004032359
ISSUE DATE / TIME: 202003181028
Unit Type and Rh: 7300

## 2018-11-24 ENCOUNTER — Other Ambulatory Visit: Payer: Self-pay

## 2018-11-24 DIAGNOSIS — D708 Other neutropenia: Secondary | ICD-10-CM

## 2018-11-24 DIAGNOSIS — D469 Myelodysplastic syndrome, unspecified: Secondary | ICD-10-CM

## 2018-11-25 ENCOUNTER — Other Ambulatory Visit: Payer: Self-pay

## 2018-11-26 ENCOUNTER — Other Ambulatory Visit: Payer: Self-pay | Admitting: Hematology and Oncology

## 2018-11-26 DIAGNOSIS — D469 Myelodysplastic syndrome, unspecified: Secondary | ICD-10-CM

## 2018-11-26 NOTE — Progress Notes (Signed)
[facility]     [address], [address]     [place], [place] [address]     Phone: [phone]      Fax: [phone]        Clinic day:  11/28/2018  Chief Complaint: Paul Pham. is a 85 y.o. 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  HPI:   The patient was last seen in the hematology clinic on 10/24/2018.  At that time, he felt "pretty good".  Exam was stable. Hemoglobin was 7.9.  Platelet count was 193,000.  WBC was 3200 with an ANC of 1400.  He began cycle #11 Vidaza.  He received 1 unit of PRBCs on 10/28/2018 and 11/16/2018.  He received Aranesp on 10/31/2018 and 11/14/2018.  During the interim, he notes no complaints.  Overall, he feels that he is doing better since his last treatment.  He notes that sometimes he overdoes it because of his increased energy.   Past Medical History:  Diagnosis Date  . Anemia   . Arthritis   . BPH (benign prostatic hypertrophy)   . Complication of anesthesia    nausea  . Diabetes type 2, controlled ([place])   . HOH (hard of hearing)    r and L ears-70% loss per pt  . Hyperlipidemia   . Hypertension     Past Surgical History:  Procedure Laterality Date  . APPENDECTOMY    . COLONOSCOPY  09/21/08   1 polyp found, tubular adenoma  . HAND SURGERY    . KNEE SURGERY Left     Family History  Problem Relation Age of Onset  . Aneurysm Mother   . Heart disease Father   . Cancer Brother        skin  . Heart disease Brother   . Heart disease Brother   . Cancer Sister        skin  . Aneurysm Brother   . Heart disease Brother   . Heart disease Sister   . Heart disease Sister   . COPD Sister   . Diabetes Sister     Social History:  reports that he has never smoked. He has never used smokeless tobacco. He reports that he does not drink alcohol or use drugs. 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 Patient denies known exposures to radiation on toxins.  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  He had his 37-year marriage anniversary on 08/23/2018.  The patient is accompanied by his wife today.  Allergies: No Known Allergies  Current Medications: Current Outpatient Medications  Medication Sig Dispense Refill  . Cranberry (THERACRAN PO) Take 1 tablet by mouth daily.     . cyanocobalamin 1000 MCG tablet Take 1,000 mcg by mouth daily.    . finasteride (PROSCAR) 5 MG tablet Take 5 mg by mouth daily.    Marland Kitchen lisinopril (PRINIVIL,ZESTRIL) 10 MG tablet Take 10 mg by mouth daily.    . metformin (FORTAMET) 1000 MG (OSM) 24 hr tablet Take 1,000 mg by mouth 2 (two) times daily with a meal.    . Multiple Vitamins-Minerals (CENTRUM SILVER PO) Take by mouth.    . Omega-3 Fatty Acids (FISH OIL PO) Take 1 tablet by mouth daily.     . Polyethylene Glycol 3350 (MIRALAX PO) Take by mouth.    [name] (PROSTATE SR PO) Take 1 tablet by mouth daily.     Marland Kitchen  simvastatin (ZOCOR) 40 MG tablet Take 40 mg by mouth daily.    Marland Kitchen glucose blood test strip FreeStyle Lite Strips    . LANCETS ULTRA FINE MISC Lancets,Ultra Thin 26 gauge    . loperamide (IMODIUM) 2 MG capsule Take 1 capsule (2 mg total) by mouth See admin instructions. With onset of loose stool, take 4mg  followed by 2mg  every 2 hours until loose bowel movement stopped. Maximum: 16 mg/day (Patient not taking: Reported on 11/28/2018) 30 capsule 0  . meclizine (ANTIVERT) 25 MG tablet Take 25 mg by mouth 2 (two) times daily as needed.     . ondansetron (ZOFRAN) 4 MG tablet Take 1 tablet (4 mg total) by mouth every 6 (six) hours as needed for nausea or vomiting. (Patient not taking: Reported on 10/24/2018) 30 tablet 0   No current facility-administered medications for this visit.     Review of Systems:  GENERAL:  Feels good.  Active.  No fevers, sweats .  Weight down 3 pounds. PERFORMANCE STATUS (ECOG):  1 HEENT:  Decreased hearing.  No visual changes, runny nose, sore throat, mouth sores or tenderness. Lungs: No  shortness of breath or cough.  No hemoptysis. Cardiac:  No chest pain, palpitations, orthopnea, or PND. GI:  No nausea, vomiting, diarrhea, constipation, melena or hematochezia. GU:  No urgency, frequency, dysuria, or hematuria. Musculoskeletal:  No back pain.  No joint pain.  No muscle tenderness. Extremities:  No pain or swelling. Skin:  No rashes or skin changes. Neuro:  No headache, numbness or weakness, balance or coordination issues. Endocrine:  Diabetes.  No thyroid issues, hot flashes or night sweats. Psych:  No mood changes, depression or anxiety. Pain:  No focal pain. Review of systems:  All other systems reviewed and found to be negative.   Physical Exam: Blood pressure 131/63, pulse 80, temperature 98.5 F (36.9 C), temperature source Tympanic, resp. rate 18, height 6\' 1"  (1.854 m), weight 177 lb 14.6 oz (80.7 kg), SpO2 100 %. GENERAL:  Well developed, well nourished, elderly gentleman sitting comfortably in the exam room in no acute distress. MENTAL STATUS:  Alert and oriented to person, place and time. HEAD:  Near alopecia/male pattern baldness.  Normocephalic, atraumatic, face symmetric, no Cushingoid features. EYES:  Gold rimmed glasses.  Pupils equal round and reactive to light and accomodation.  No conjunctivitis or scleral icterus. ENT:  Hearing aide.  Oropharynx clear without lesion.  Tongue normal. Mucous membranes moist.  RESPIRATORY:  Clear to auscultation without rales, wheezes or rhonchi. CARDIOVASCULAR:  Regular rate and rhythm without murmur, rub or gallop. ABDOMEN:  Soft, non-tender, with active bowel sounds, and no hepatosplenomegaly.  No masses. SKIN:  No rashes, ulcers or lesions. EXTREMITIES: No edema, no skin discoloration or tenderness.  No palpable cords. LYMPH NODES: No palpable cervical, supraclavicular, axillary or inguinal adenopathy  NEUROLOGICAL: Unremarkable. PSYCH:  Appropriate.    Appointment on 11/28/2018  Component Date Value Ref Range  Status  . Sodium 11/28/2018 135  135 - 145 mmol/L Final  . Potassium 11/28/2018 4.4  3.5 - 5.1 mmol/L Final  . Chloride 11/28/2018 102  98 - 111 mmol/L Final  . CO2 11/28/2018 24  22 - 32 mmol/L Final  . Glucose, Bld 11/28/2018 204* 70 - 99 mg/dL Final  . BUN 11/28/2018 25* 8 - 23 mg/dL Final  . Creatinine, Ser 11/28/2018 0.78  0.61 - 1.24 mg/dL Final  . Calcium 11/28/2018 8.6* 8.9 - 10.3 mg/dL Final  . Total Protein 11/28/2018 7.0  6.5 -  8.1 g/dL Final  . Albumin 11/28/2018 4.3  3.5 - 5.0 g/dL Final  . AST 11/28/2018 19  15 - 41 U/L Final  . ALT 11/28/2018 16  0 - 44 U/L Final  . Alkaline Phosphatase 11/28/2018 28* 38 - 126 U/L Final  . Total Bilirubin 11/28/2018 0.9  0.3 - 1.2 mg/dL Final  . GFR calc non Af Amer 11/28/2018 >60  >60 mL/min Final  . GFR calc Af Amer 11/28/2018 >60  >60 mL/min Final  . Anion gap 11/28/2018 9  5 - 15 Final   Performed at [facility], [address] [place], [place] [address]  . WBC 11/28/2018 5.6  4.0 - 10.5 K/uL Final  . RBC 11/28/2018 3.05* 4.22 - 5.81 MIL/uL Final  . Hemoglobin 11/28/2018 8.5* 13.0 - 17.0 g/dL Final  . HCT 11/28/2018 27.5* 39.0 - 52.0 % Final  . MCV 11/28/2018 90.2  80.0 - 100.0 fL Final  . MCH 11/28/2018 27.9  26.0 - 34.0 pg Final  . MCHC 11/28/2018 30.9  30.0 - 36.0 g/dL Final  . RDW 11/28/2018 17.2* 11.5 - 15.5 % Final  . Platelets 11/28/2018 238  150 - 400 K/uL Final  . nRBC 11/28/2018 0.0  0.0 - 0.2 % Final  . Neutrophils Relative % 11/28/2018 56  % Final  . Neutro Abs 11/28/2018 3.2  1.7 - 7.7 K/uL Final  . Lymphocytes Relative 11/28/2018 31  % Final  . Lymphs Abs 11/28/2018 1.7  0.7 - 4.0 K/uL Final  . Monocytes Relative 11/28/2018 11  % Final  . Monocytes Absolute 11/28/2018 0.6  0.1 - 1.0 K/uL Final  . Eosinophils Relative 11/28/2018 0  % Final  . Eosinophils Absolute 11/28/2018 0.0  0.0 - 0.5 K/uL Final  . Basophils Relative 11/28/2018 0  % Final  . Basophils Absolute 11/28/2018 0.0  0.0 - 0.1 K/uL  Final  . Immature Granulocytes 11/28/2018 2  % Final  . Abs Immature Granulocytes 11/28/2018 0.10* 0.00 - 0.07 K/uL Final   Performed at [facility], [address], [place], [place] [address]    Assessment:  Lillian Tigges. is a 85 y.o. 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  Bone marrow on 09/22/2017 revealed a hypercellular for age with dyspoietic changes variably involving myeloid cell lines, but with main involvement of the granulocytic/monocytic cell line. This was associated with bone marrow monocytosis and borderline number to slight increase in blastic cells. The overall changes favor a primary myeloid neoplasm, particularly a myelodysplastic syndrome especially refractory anemia with excess blasts (RAEB-1) or possibly refractory cytopenia with multilineage dysplasia. Consideration was also given to an evolving myelodysplastic/myeloproliferative neoplasm such as chronic myelomonocytic leukemia but the lack of absolute peripheral monocytosis precluded such a diagnosis at this time. Flow cytometry was negative.  Cytogenetics were normal (46, XY). Foundation one was positive for ASXL1 Y403KV*42, EZH2 Splice site 595-6L>O, RUNX1 [id number].  IPSS-R 4.5, intermediate group.  Anemia work-up on 08/17/2017: Vitamin B12 (658), folate (22.0), and TSH (2.688). Retic was 2.6%.  Haptoglobin was 116 on 08/19/2017.  Epo level was 104.8 on 10/05/2017.  Ferritin has been followed:  106 on 08/17/2017, 131 on 11/04/2017, 144 on 05/30/2018, 394 on 09/01/2018, and 441 on 11/28/2018.  Iron saturation was 34% on 08/17/2017 and 89% on 09/01/2018.  Bone marrow on 09/22/2017 revealed a hypercellular for age with dyspoietic changes variably involving myeloid cell lines, but with main involvement of the granulocytic/monocytic cell line. This was associated with bone marrow monocytosis and borderline number to slight  increase in blasts (5%). The overall changes favor a primary myeloid neoplasm, particularly a myelodysplastic  syndrome especially refractory anemia with excess blasts (RAEB-1) or possibly refractory cytopenia with multilineage dysplasia. Consideration was also given to an evolving myelodysplastic/myeloproliferative neoplasm such as chronic myelomonocytic leukemia but the lack of absolute peripheral monocytosis precluded such a diagnosis at this time. Flow cytometry was negative.  Cytogenetics were normal (46, XY). Foundation One was positive for ASXL1 [id number]*09, EZH2 Splice site 381-8E>X, RUNX1 [id number].  He has received 11 cycles of Vidaza (11/04/2017 - 08/22/2018; 10/24/2018).  He has received Aranesp (initially 150 mcg every 2 weeks post chemotherapy then 300 mcg every 1-2 weeks after cycle #3; last 09/19/2018).  He last received Aranesp on 11/14/2018.  He has received 1-2 units of PRBCs with each cycle (last 11/16/2018).  He has received 12 units of PRBCs since 08/17/2017.  Bone marrow on 08/12/2018 revealed a hypercellular marrow with dyspoietic changes.  There was a borderline number of blasts (5%) seen by morphology. The findings were similar to previous biopsy although the cellularity is slightly higher in the current material.consistent with previously known myelodysplastic syndrome.  Flow cytometry was negative.  Cytogenetics were normal (46, XY).  Symptomatically, he is feeling better.  He denies any concerns.  Exam is unremarkable.  Hemoglobin is 8.5.  Platelet count is 238,000.  WBC is 5600 ([place] 3200).  Plan: 1.   Labs today:  CBC with diff, CMP, ferritin, hold tube.   2.   Refractory anemia with excess blasts (RAEB-1)  Clinically he is doing well.  Review goal of therapy:  decrease transfusion dependence and prevent acceleration of disease (blasts).  Continue Vidaza every 5-6 weeks as tolerated.  Continue Aranesp every 2 weeks secondary to decrease transfusion dependence.  Begin cycle #12 Vidaza (day 1-5) today.  Aranesp today. 3.   Iron overload  Patient has received 12 units of PRBCs since  08/17/2017.  Follow ferritin.  Anticipate initiation of oral chelation therapy. 4.   RTC in 2 weeks for labs (CBC with diff, hold tube), +/- Aranesp. 5.   RTC in 4 weeks for labs (CBC with diff, hold tube), +/- Aranesp. 6.   RTC in 5 weeks for MD assessment, labs (CBC with diff, CMP, ferritin, hold tube), and cycle #13 Vidaza (day 1-5).  I discussed the assessment and treatment plan with the patient.  The patient was provided an opportunity to ask questions and all were answered.  The patient agreed with the plan and demonstrated an understanding of the instructions.  The patient was advised to call back or seek an in person evaluation if the symptoms worsen or if the condition fails to improve as anticipated.    Lequita Asal, MD  11/28/2018, 1:30 PM

## 2018-11-28 ENCOUNTER — Telehealth: Payer: Self-pay

## 2018-11-28 ENCOUNTER — Inpatient Hospital Stay: Payer: Medicare Other

## 2018-11-28 ENCOUNTER — Other Ambulatory Visit: Payer: Self-pay

## 2018-11-28 ENCOUNTER — Inpatient Hospital Stay (HOSPITAL_BASED_OUTPATIENT_CLINIC_OR_DEPARTMENT_OTHER): Payer: Medicare Other | Admitting: Hematology and Oncology

## 2018-11-28 ENCOUNTER — Encounter: Payer: Self-pay | Admitting: Hematology and Oncology

## 2018-11-28 VITALS — BP 131/63 | HR 80 | Temp 98.5°F | Resp 18 | Ht 73.0 in | Wt 177.9 lb

## 2018-11-28 DIAGNOSIS — Z5111 Encounter for antineoplastic chemotherapy: Secondary | ICD-10-CM | POA: Diagnosis not present

## 2018-11-28 DIAGNOSIS — D469 Myelodysplastic syndrome, unspecified: Secondary | ICD-10-CM | POA: Diagnosis not present

## 2018-11-28 DIAGNOSIS — D649 Anemia, unspecified: Secondary | ICD-10-CM

## 2018-11-28 DIAGNOSIS — D708 Other neutropenia: Secondary | ICD-10-CM

## 2018-11-28 LAB — CBC WITH DIFFERENTIAL/PLATELET
Abs Immature Granulocytes: 0.1 10*3/uL — ABNORMAL HIGH (ref 0.00–0.07)
Basophils Absolute: 0 10*3/uL (ref 0.0–0.1)
Basophils Relative: 0 %
Eosinophils Absolute: 0 10*3/uL (ref 0.0–0.5)
Eosinophils Relative: 0 %
HCT: 27.5 % — ABNORMAL LOW (ref 39.0–52.0)
Hemoglobin: 8.5 g/dL — ABNORMAL LOW (ref 13.0–17.0)
Immature Granulocytes: 2 %
Lymphocytes Relative: 31 %
Lymphs Abs: 1.7 10*3/uL (ref 0.7–4.0)
MCH: 27.9 pg (ref 26.0–34.0)
MCHC: 30.9 g/dL (ref 30.0–36.0)
MCV: 90.2 fL (ref 80.0–100.0)
Monocytes Absolute: 0.6 10*3/uL (ref 0.1–1.0)
Monocytes Relative: 11 %
Neutro Abs: 3.2 10*3/uL (ref 1.7–7.7)
Neutrophils Relative %: 56 %
Platelets: 238 10*3/uL (ref 150–400)
RBC: 3.05 MIL/uL — ABNORMAL LOW (ref 4.22–5.81)
RDW: 17.2 % — ABNORMAL HIGH (ref 11.5–15.5)
WBC: 5.6 10*3/uL (ref 4.0–10.5)
nRBC: 0 % (ref 0.0–0.2)

## 2018-11-28 LAB — COMPREHENSIVE METABOLIC PANEL
ALT: 16 U/L (ref 0–44)
AST: 19 U/L (ref 15–41)
Albumin: 4.3 g/dL (ref 3.5–5.0)
Alkaline Phosphatase: 28 U/L — ABNORMAL LOW (ref 38–126)
Anion gap: 9 (ref 5–15)
BUN: 25 mg/dL — ABNORMAL HIGH (ref 8–23)
CO2: 24 mmol/L (ref 22–32)
Calcium: 8.6 mg/dL — ABNORMAL LOW (ref 8.9–10.3)
Chloride: 102 mmol/L (ref 98–111)
Creatinine, Ser: 0.78 mg/dL (ref 0.61–1.24)
GFR calc Af Amer: >60 mL/min (ref >60)
GFR calc non Af Amer: >60 mL/min (ref >60)
Glucose, Bld: 204 mg/dL — ABNORMAL HIGH (ref 70–99)
Potassium: 4.4 mmol/L (ref 3.5–5.1)
Sodium: 135 mmol/L (ref 135–145)
Total Bilirubin: 0.9 mg/dL (ref 0.3–1.2)
Total Protein: 7 g/dL (ref 6.5–8.1)

## 2018-11-28 LAB — FERRITIN: Ferritin: 441 ng/mL — ABNORMAL HIGH (ref 24–336)

## 2018-11-28 LAB — SAMPLE TO BLOOD BANK

## 2018-11-28 MED ORDER — ONDANSETRON HCL 4 MG PO TABS
8.0000 mg | ORAL_TABLET | Freq: Once | ORAL | Status: AC
  Administered 2018-11-28: 8 mg via ORAL
  Filled 2018-11-28: qty 2

## 2018-11-28 MED ORDER — AZACITIDINE CHEMO SQ INJECTION
75.0000 mg/m2 | Freq: Once | INTRAMUSCULAR | Status: AC
  Administered 2018-11-28: 155 mg via SUBCUTANEOUS
  Filled 2018-11-28: qty 6.2

## 2018-11-28 MED ORDER — DARBEPOETIN ALFA 300 MCG/0.6ML IJ SOSY
300.0000 ug | PREFILLED_SYRINGE | Freq: Once | INTRAMUSCULAR | Status: AC
  Administered 2018-11-28: 300 ug via SUBCUTANEOUS
  Filled 2018-11-28: qty 0.6

## 2018-11-28 NOTE — Telephone Encounter (Signed)
-----   Message from Lequita Asal, MD sent at 11/28/2018  4:11 PM EDT ----- Regarding: Please call patient  Calcium is a little low.  Is he taking calcium?  M ----- Message ----- From: Buel Ream, Lab In [place] Sent: 11/28/2018   1:14 PM EDT To: Lequita Asal, MD

## 2018-11-28 NOTE — Telephone Encounter (Signed)
Spoke with Ms Hogen to see if Mr Paul Pham was taking any calcium. She state he just went out and bought some form the store today. I have inform her if he can start with 600 mg a day. Ms Basher states she will have him start today. Ms Spratley was understanding and agreeable to start the Calcium.

## 2018-11-28 NOTE — Progress Notes (Signed)
No new changes noted today 

## 2018-11-29 ENCOUNTER — Inpatient Hospital Stay: Payer: Medicare Other

## 2018-11-29 VITALS — BP 118/68 | HR 76 | Temp 97.5°F | Resp 18

## 2018-11-29 DIAGNOSIS — Z5111 Encounter for antineoplastic chemotherapy: Secondary | ICD-10-CM | POA: Diagnosis not present

## 2018-11-29 DIAGNOSIS — D469 Myelodysplastic syndrome, unspecified: Secondary | ICD-10-CM

## 2018-11-29 MED ORDER — AZACITIDINE CHEMO SQ INJECTION
75.0000 mg/m2 | Freq: Once | INTRAMUSCULAR | Status: AC
  Administered 2018-11-29: 155 mg via SUBCUTANEOUS
  Filled 2018-11-29: qty 6.2

## 2018-11-29 MED ORDER — ONDANSETRON HCL 4 MG PO TABS
8.0000 mg | ORAL_TABLET | Freq: Once | ORAL | Status: AC
  Administered 2018-11-29: 8 mg via ORAL
  Filled 2018-11-29: qty 2

## 2018-11-30 ENCOUNTER — Inpatient Hospital Stay: Payer: Medicare Other | Attending: Hematology and Oncology

## 2018-11-30 ENCOUNTER — Other Ambulatory Visit: Payer: Self-pay

## 2018-11-30 VITALS — BP 126/57 | HR 78 | Temp 98.4°F | Resp 18

## 2018-11-30 DIAGNOSIS — D469 Myelodysplastic syndrome, unspecified: Secondary | ICD-10-CM

## 2018-11-30 DIAGNOSIS — D4621 Refractory anemia with excess of blasts 1: Secondary | ICD-10-CM | POA: Insufficient documentation

## 2018-11-30 DIAGNOSIS — Z5111 Encounter for antineoplastic chemotherapy: Secondary | ICD-10-CM | POA: Diagnosis present

## 2018-11-30 DIAGNOSIS — Z79899 Other long term (current) drug therapy: Secondary | ICD-10-CM | POA: Insufficient documentation

## 2018-11-30 MED ORDER — AZACITIDINE CHEMO SQ INJECTION
75.0000 mg/m2 | Freq: Once | INTRAMUSCULAR | Status: AC
  Administered 2018-11-30: 155 mg via SUBCUTANEOUS
  Filled 2018-11-30: qty 6.2

## 2018-11-30 MED ORDER — ONDANSETRON HCL 4 MG PO TABS
8.0000 mg | ORAL_TABLET | Freq: Once | ORAL | Status: AC
  Administered 2018-11-30: 8 mg via ORAL
  Filled 2018-11-30: qty 2

## 2018-12-01 ENCOUNTER — Inpatient Hospital Stay: Payer: Medicare Other | Attending: Hematology and Oncology

## 2018-12-01 VITALS — BP 123/61 | HR 77 | Temp 97.6°F | Resp 18

## 2018-12-01 DIAGNOSIS — D469 Myelodysplastic syndrome, unspecified: Secondary | ICD-10-CM | POA: Insufficient documentation

## 2018-12-01 DIAGNOSIS — Z5111 Encounter for antineoplastic chemotherapy: Secondary | ICD-10-CM | POA: Insufficient documentation

## 2018-12-01 DIAGNOSIS — Z79899 Other long term (current) drug therapy: Secondary | ICD-10-CM | POA: Insufficient documentation

## 2018-12-01 DIAGNOSIS — D4621 Refractory anemia with excess of blasts 1: Secondary | ICD-10-CM | POA: Diagnosis not present

## 2018-12-01 MED ORDER — AZACITIDINE CHEMO SQ INJECTION
75.0000 mg/m2 | Freq: Once | INTRAMUSCULAR | Status: AC
  Administered 2018-12-01: 155 mg via SUBCUTANEOUS
  Filled 2018-12-01: qty 6.2

## 2018-12-01 MED ORDER — ONDANSETRON HCL 4 MG PO TABS
8.0000 mg | ORAL_TABLET | Freq: Once | ORAL | Status: AC
  Administered 2018-12-01: 13:00:00 8 mg via ORAL

## 2018-12-01 NOTE — Patient Instructions (Signed)
Azacitidine suspension for injection (subcutaneous use)  What is this medicine?  AZACITIDINE (ay za SITE i deen) is a chemotherapy drug. This medicine reduces the growth of cancer cells and can suppress the immune system. It is used for treating myelodysplastic syndrome or some types of leukemia.  This medicine may be used for other purposes; ask your health care provider or pharmacist if you have questions.  COMMON BRAND NAME(S): Vidaza  What should I tell my health care provider before I take this medicine?  They need to know if you have any of these conditions:  -kidney disease  -liver disease  -liver tumors  -an unusual or allergic reaction to azacitidine, mannitol, other medicines, foods, dyes, or preservatives  -pregnant or trying to get pregnant  -breast-feeding  How should I use this medicine?  This medicine is for injection under the skin. It is administered in a hospital or clinic by a specially trained health care professional.  Talk to your pediatrician regarding the use of this medicine in children. While this drug may be prescribed for selected conditions, precautions do apply.  Overdosage: If you think you have taken too much of this medicine contact a poison control center or emergency room at once.  NOTE: This medicine is only for you. Do not share this medicine with others.  What if I miss a dose?  It is important not to miss your dose. Call your doctor or health care professional if you are unable to keep an appointment.  What may interact with this medicine?  Interactions have not been studied.  Give your health care provider a list of all the medicines, herbs, non-prescription drugs, or dietary supplements you use. Also tell them if you smoke, drink alcohol, or use illegal drugs. Some items may interact with your medicine.  This list may not describe all possible interactions. Give your health care provider a list of all the medicines, herbs, non-prescription drugs, or dietary supplements you  use. Also tell them if you smoke, drink alcohol, or use illegal drugs. Some items may interact with your medicine.  What should I watch for while using this medicine?  Visit your doctor for checks on your progress. This drug may make you feel generally unwell. This is not uncommon, as chemotherapy can affect healthy cells as well as cancer cells. Report any side effects. Continue your course of treatment even though you feel ill unless your doctor tells you to stop.  In some cases, you may be given additional medicines to help with side effects. Follow all directions for their use.  Call your doctor or health care professional for advice if you get a fever, chills or sore throat, or other symptoms of a cold or flu. Do not treat yourself. This drug decreases your body's ability to fight infections. Try to avoid being around people who are sick.  This medicine may increase your risk to bruise or bleed. Call your doctor or health care professional if you notice any unusual bleeding.  You may need blood work done while you are taking this medicine.  Do not become pregnant while taking this medicine and for 6 months after the last dose. Women should inform their doctor if they wish to become pregnant or think they might be pregnant. Men should not father a child while taking this medicine and for 3 months after the last dose. There is a potential for serious side effects to an unborn child. Talk to your health care professional or pharmacist for   more information. Do not breast-feed an infant while taking this medicine and for 1 week after the last dose.  This medicine may interfere with the ability to have a child. Talk with your doctor or health care professional if you are concerned about your fertility.  What side effects may I notice from receiving this medicine?  Side effects that you should report to your doctor or health care professional as soon as possible:  -allergic reactions like skin rash, itching or hives,  swelling of the face, lips, or tongue  -low blood counts - this medicine may decrease the number of white blood cells, red blood cells and platelets. You may be at increased risk for infections and bleeding.  -signs of infection - fever or chills, cough, sore throat, pain passing urine  -signs of decreased platelets or bleeding - bruising, pinpoint red spots on the skin, black, tarry stools, blood in the urine  -signs of decreased red blood cells - unusually weak or tired, fainting spells, lightheadedness  -signs and symptoms of kidney injury like trouble passing urine or change in the amount of urine  -signs and symptoms of liver injury like dark yellow or brown urine; general ill feeling or flu-like symptoms; light-colored stools; loss of appetite; nausea; right upper belly pain; unusually weak or tired; yellowing of the eyes or skin  Side effects that usually do not require medical attention (report to your doctor or health care professional if they continue or are bothersome):  -constipation  -diarrhea  -nausea, vomiting  -pain or redness at the injection site  -unusually weak or tired  This list may not describe all possible side effects. Call your doctor for medical advice about side effects. You may report side effects to FDA at 1-800-FDA-1088.  Where should I keep my medicine?  This drug is given in a hospital or clinic and will not be stored at home.  NOTE: This sheet is a summary. It may not cover all possible information. If you have questions about this medicine, talk to your doctor, pharmacist, or health care provider.   2019 Elsevier/Gold Standard (2016-09-15 14:37:51)

## 2018-12-02 ENCOUNTER — Inpatient Hospital Stay: Payer: Medicare Other

## 2018-12-02 ENCOUNTER — Other Ambulatory Visit: Payer: Self-pay

## 2018-12-02 VITALS — BP 132/65 | HR 76 | Temp 97.0°F | Resp 18

## 2018-12-02 DIAGNOSIS — D469 Myelodysplastic syndrome, unspecified: Secondary | ICD-10-CM

## 2018-12-02 DIAGNOSIS — Z5111 Encounter for antineoplastic chemotherapy: Secondary | ICD-10-CM | POA: Diagnosis not present

## 2018-12-02 MED ORDER — AZACITIDINE CHEMO SQ INJECTION
75.0000 mg/m2 | Freq: Once | INTRAMUSCULAR | Status: AC
  Administered 2018-12-02: 155 mg via SUBCUTANEOUS
  Filled 2018-12-02: qty 6.2

## 2018-12-02 MED ORDER — ONDANSETRON HCL 4 MG PO TABS
8.0000 mg | ORAL_TABLET | Freq: Once | ORAL | Status: AC
  Administered 2018-12-02: 14:00:00 8 mg via ORAL
  Filled 2018-12-02: qty 2

## 2018-12-11 ENCOUNTER — Other Ambulatory Visit: Payer: Self-pay

## 2018-12-12 ENCOUNTER — Inpatient Hospital Stay: Payer: Medicare Other

## 2018-12-12 ENCOUNTER — Telehealth: Payer: Self-pay

## 2018-12-12 VITALS — BP 142/55 | HR 80 | Temp 97.3°F | Resp 18

## 2018-12-12 DIAGNOSIS — D469 Myelodysplastic syndrome, unspecified: Secondary | ICD-10-CM

## 2018-12-12 DIAGNOSIS — D649 Anemia, unspecified: Secondary | ICD-10-CM

## 2018-12-12 DIAGNOSIS — Z5111 Encounter for antineoplastic chemotherapy: Secondary | ICD-10-CM | POA: Diagnosis not present

## 2018-12-12 LAB — CBC WITH DIFFERENTIAL/PLATELET
Abs Immature Granulocytes: 0.1 10*3/uL — ABNORMAL HIGH (ref 0.00–0.07)
Basophils Absolute: 0 10*3/uL (ref 0.0–0.1)
Basophils Relative: 0 %
Eosinophils Absolute: 0 10*3/uL (ref 0.0–0.5)
Eosinophils Relative: 0 %
HCT: 24.9 % — ABNORMAL LOW (ref 39.0–52.0)
Hemoglobin: 7.8 g/dL — ABNORMAL LOW (ref 13.0–17.0)
Immature Granulocytes: 2 %
Lymphocytes Relative: 34 %
Lymphs Abs: 1.5 10*3/uL (ref 0.7–4.0)
MCH: 28.3 pg (ref 26.0–34.0)
MCHC: 31.3 g/dL (ref 30.0–36.0)
MCV: 90.2 fL (ref 80.0–100.0)
Monocytes Absolute: 0.4 10*3/uL (ref 0.1–1.0)
Monocytes Relative: 9 %
Neutro Abs: 2.5 10*3/uL (ref 1.7–7.7)
Neutrophils Relative %: 55 %
Platelets: 186 10*3/uL (ref 150–400)
RBC: 2.76 MIL/uL — ABNORMAL LOW (ref 4.22–5.81)
RDW: 17.3 % — ABNORMAL HIGH (ref 11.5–15.5)
WBC: 4.5 10*3/uL (ref 4.0–10.5)
nRBC: 0 % (ref 0.0–0.2)

## 2018-12-12 LAB — SAMPLE TO BLOOD BANK

## 2018-12-12 MED ORDER — DARBEPOETIN ALFA 300 MCG/0.6ML IJ SOSY
300.0000 ug | PREFILLED_SYRINGE | Freq: Once | INTRAMUSCULAR | Status: AC
  Administered 2018-12-12: 300 ug via SUBCUTANEOUS

## 2018-12-12 NOTE — Telephone Encounter (Signed)
-----   Message from Lequita Asal, MD sent at 12/12/2018  4:29 PM EDT ----- Regarding: Please call patient  Ask about his energy level.  Hemoglobin has drifted down.  M ----- Message ----- From: Buel Ream, Lab In [place] Sent: 12/12/2018   1:51 PM EDT To: Lequita Asal, MD

## 2018-12-12 NOTE — Telephone Encounter (Signed)
Contacted patient to inquire on symptoms d/t low hemoglobin levels. Patient states "I'm doing pretty well. I'm able to complete all of my chores." Patient denies any decrease in energy levels and reports he feels fine. Denies any bleeding or black stools. Advised patient to call back with any symptoms. Patient verbalizes understanding and denies any concerns at this time.

## 2018-12-26 ENCOUNTER — Other Ambulatory Visit: Payer: Self-pay

## 2018-12-26 ENCOUNTER — Inpatient Hospital Stay: Payer: Medicare Other

## 2018-12-26 VITALS — BP 136/86 | HR 86 | Temp 97.7°F | Resp 18

## 2018-12-26 DIAGNOSIS — Z5111 Encounter for antineoplastic chemotherapy: Secondary | ICD-10-CM | POA: Diagnosis not present

## 2018-12-26 DIAGNOSIS — D649 Anemia, unspecified: Secondary | ICD-10-CM

## 2018-12-26 DIAGNOSIS — D469 Myelodysplastic syndrome, unspecified: Secondary | ICD-10-CM

## 2018-12-26 LAB — CBC WITH DIFFERENTIAL/PLATELET
Abs Immature Granulocytes: 0.03 10*3/uL (ref 0.00–0.07)
Basophils Absolute: 0 10*3/uL (ref 0.0–0.1)
Basophils Relative: 0 %
Eosinophils Absolute: 0 10*3/uL (ref 0.0–0.5)
Eosinophils Relative: 0 %
HCT: 25.3 % — ABNORMAL LOW (ref 39.0–52.0)
Hemoglobin: 7.4 g/dL — ABNORMAL LOW (ref 13.0–17.0)
Immature Granulocytes: 1 %
Lymphocytes Relative: 35 %
Lymphs Abs: 1.5 10*3/uL (ref 0.7–4.0)
MCH: 27 pg (ref 26.0–34.0)
MCHC: 29.2 g/dL — ABNORMAL LOW (ref 30.0–36.0)
MCV: 92.3 fL (ref 80.0–100.0)
Monocytes Absolute: 0.5 10*3/uL (ref 0.1–1.0)
Monocytes Relative: 11 %
Neutro Abs: 2.3 10*3/uL (ref 1.7–7.7)
Neutrophils Relative %: 53 %
Platelets: 247 10*3/uL (ref 150–400)
RBC: 2.74 MIL/uL — ABNORMAL LOW (ref 4.22–5.81)
RDW: 19.1 % — ABNORMAL HIGH (ref 11.5–15.5)
WBC: 4.3 10*3/uL (ref 4.0–10.5)
nRBC: 0.7 % — ABNORMAL HIGH (ref 0.0–0.2)

## 2018-12-26 LAB — SAMPLE TO BLOOD BANK

## 2018-12-26 MED ORDER — DARBEPOETIN ALFA 300 MCG/0.6ML IJ SOSY
300.0000 ug | PREFILLED_SYRINGE | Freq: Once | INTRAMUSCULAR | Status: AC
  Administered 2018-12-26: 300 ug via SUBCUTANEOUS

## 2018-12-26 NOTE — Patient Instructions (Signed)

## 2018-12-26 NOTE — Progress Notes (Signed)
Patient's hemoglobin 7.4 today.  He does not report any dizziness or shortness of breath and says that he does not feel that he needs a transfusion at this time.

## 2019-01-01 ENCOUNTER — Other Ambulatory Visit: Payer: Self-pay

## 2019-01-01 NOTE — Progress Notes (Signed)
[facility]  [address] [place], [place] [address] Phone: [phone]  Fax: [phone]   Clinic Day:  01/02/2019  Referring physician: Tracie Harrier, MD  Chief Complaint: Paul Manning. is a 85 y.o. male with RAEB-1 who is seen for assessment prior to cycle #13 Viadaza.  HPI: The patient was last seen in the medical oncology clinic on 11/28/2018. At that time, he was feeling better. He denied any concerns.  Exam was unremarkable. Hemoglobin was 8.5. Platelet count was 238,000. WBC was 5600 ([place] 3200). He began cycle #12 Vidaza. His calcium was 8.6 and he began taking 600mg  calcium daily at that time.   He received one unit of PRBCs on 12/06/2018. He received Aranesp on 11/28/2018, 12/12/2018 and 12/26/2018.  CBC on 12/12/2018 showed hemoglobin 7.8, hematocrit 24.9, platelets 186,000, and WBC 4500. Patient reported he was doing well and denied any decrease in energy levels. CBC on 12/26/2018 showed hemoglobin 7.4, hematocrit 25.3, WBC 4300, platelets 247,000. Patient denies any dizziness or SOB and declined a transfusion at that time.   During the interim, he has felt "pretty good".  He is walking 1 2/3 miles per day.  He denies any shortness of breath or chest pain.  He denies any infections. He denies any bruising or bleeding.  He notes some constipation.   Past Medical History:  Diagnosis Date  . Anemia   . Arthritis   . BPH (benign prostatic hypertrophy)   . Complication of anesthesia    nausea  . Diabetes type 2, controlled ([place])   . HOH (hard of hearing)    r and L ears-70% loss per pt  . Hyperlipidemia   . Hypertension     Past Surgical History:  Procedure Laterality Date  . APPENDECTOMY    . COLONOSCOPY  09/21/08   1 polyp found, tubular adenoma  . HAND SURGERY    . KNEE SURGERY Left     Family History  Problem Relation Age of Onset  . Aneurysm Mother   . Heart disease Father   . Cancer Brother        skin  . Heart  disease Brother   . Heart disease Brother   . Cancer Sister        skin  . Aneurysm Brother   . Heart disease Brother   . Heart disease Sister   . Heart disease Sister   . COPD Sister   . Diabetes Sister     Social History:  reports that he has never smoked. He has never used smokeless tobacco. He reports that he does not drink alcohol or use drugs. 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 Patient denies known exposures to radiation on toxins.  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  He had his 102-year marriage anniversary on 08/23/2018.  He is walking 1 2/3 miles per day.  The patient is alone today.  Allergies: No Known Allergies  Current Medications: Current Outpatient Medications  Medication Sig Dispense Refill  . aspirin 81 MG chewable tablet Chew 81 mg by mouth daily.    . Cranberry (THERACRAN PO) Take 1 tablet by mouth daily.     . cyanocobalamin 1000 MCG tablet Take 1,000 mcg by mouth daily.    . finasteride (PROSCAR) 5 MG tablet Take 5 mg by mouth daily.    Marland Kitchen lisinopril (PRINIVIL,ZESTRIL) 10 MG tablet Take 10 mg by mouth daily.    . metformin (FORTAMET) 1000 MG (  OSM) 24 hr tablet Take 1,000 mg by mouth 2 (two) times daily with a meal.    . Multiple Vitamins-Minerals (CENTRUM SILVER PO) Take by mouth.    . Omega-3 Fatty Acids (FISH OIL PO) Take 1 tablet by mouth daily.     . Polyethylene Glycol 3350 (MIRALAX PO) Take by mouth.    [name] (PROSTATE SR PO) Take 1 tablet by mouth daily.     . simvastatin (ZOCOR) 40 MG tablet Take 40 mg by mouth daily.    Marland Kitchen glucose blood test strip FreeStyle Lite Strips    . LANCETS ULTRA FINE MISC Lancets,Ultra Thin 26 gauge    . loperamide (IMODIUM) 2 MG capsule Take 1 capsule (2 mg total) by mouth See admin instructions. With onset of loose stool, take 4mg  followed by 2mg  every 2 hours until loose bowel movement stopped. Maximum: 16 mg/day (Patient not taking: Reported on 11/28/2018) 30 capsule 0  . meclizine  (ANTIVERT) 25 MG tablet Take 25 mg by mouth 2 (two) times daily as needed.     . ondansetron (ZOFRAN) 4 MG tablet Take 1 tablet (4 mg total) by mouth every 6 (six) hours as needed for nausea or vomiting. (Patient not taking: Reported on 10/24/2018) 30 tablet 0   No current facility-administered medications for this visit.     Review of Systems  Constitutional: Negative.  Negative for chills, diaphoresis, fever, malaise/fatigue and weight loss (stable).       Feels "pretty good".  HENT: Positive for hearing loss. Negative for congestion, ear pain, nosebleeds, sinus pain and sore throat.   Eyes: Negative.  Negative for blurred vision, double vision, photophobia and pain.  Respiratory: Negative.  Negative for cough, sputum production, shortness of breath and wheezing.   Cardiovascular: Negative.  Negative for chest pain, palpitations, orthopnea, leg swelling and PND.  Gastrointestinal: Positive for constipation. Negative for abdominal pain, blood in stool, diarrhea, melena, nausea and vomiting.  Genitourinary: Negative.  Negative for dysuria, frequency, hematuria and urgency.  Musculoskeletal: Negative.  Negative for back pain, myalgias and neck pain.  Skin: Negative.  Negative for itching and rash.  Neurological: Negative.  Negative for dizziness, tingling, sensory change, focal weakness, weakness and headaches.  Endo/Heme/Allergies: Does not bruise/bleed easily.       Diabetes.  Psychiatric/Behavioral: Negative for depression and memory loss. The patient is not nervous/anxious and does not have insomnia.    Performance status (ECOG): 1  Physical Exam  Constitutional: He is oriented to person, place, and time. He appears well-developed and well-nourished. No distress.  HENT:  Head: Normocephalic and atraumatic.  Mouth/Throat: Oropharynx is clear and moist. No oropharyngeal exudate.  Hearing aid.  Sparse short gray hair.  Near alopecia/male pattern baldness.  Eyes: Pupils are equal, round,  and reactive to light. Conjunctivae and EOM are normal. No scleral icterus.  Gold rimmed glasses.  Blue eyes.  Neck: Normal range of motion. Neck supple. No JVD present.  Cardiovascular: Normal rate, regular rhythm and normal heart sounds. Exam reveals no gallop and no friction rub.  No murmur heard. Pulmonary/Chest: Effort normal and breath sounds normal. No respiratory distress. He has no wheezes. He has no rales.  Abdominal: Soft. Bowel sounds are normal. He exhibits no distension and no mass. There is no abdominal tenderness. There is no rebound and no guarding.  Musculoskeletal: Normal range of motion.        General: No tenderness or edema.  Lymphadenopathy:    He has no cervical adenopathy.  He has no axillary adenopathy.       Right: No supraclavicular adenopathy present.       Left: No supraclavicular adenopathy present.  Neurological: He is alert and oriented to person, place, and time. He has normal reflexes.  Skin: Skin is warm and dry. No rash noted. He is not diaphoretic. No erythema. No pallor.  Psychiatric: He has a normal mood and affect. His behavior is normal. Judgment and thought content normal.  Nursing note and vitals reviewed.   Appointment on 01/02/2019  Component Date Value Ref Range Status  . WBC 01/02/2019 4.4  4.0 - 10.5 K/uL Final  . RBC 01/02/2019 2.80* 4.22 - 5.81 MIL/uL Final  . Hemoglobin 01/02/2019 7.4* 13.0 - 17.0 g/dL Final  . HCT 01/02/2019 26.2* 39.0 - 52.0 % Final  . MCV 01/02/2019 93.6  80.0 - 100.0 fL Final  . MCH 01/02/2019 26.4  26.0 - 34.0 pg Final  . MCHC 01/02/2019 28.2* 30.0 - 36.0 g/dL Final  . RDW 01/02/2019 19.3* 11.5 - 15.5 % Final  . Platelets 01/02/2019 241  150 - 400 K/uL Final  . nRBC 01/02/2019 0.9* 0.0 - 0.2 % Final  . Neutrophils Relative % 01/02/2019 54  % Final  . Neutro Abs 01/02/2019 2.3  1.7 - 7.7 K/uL Final  . Lymphocytes Relative 01/02/2019 31  % Final  . Lymphs Abs 01/02/2019 1.4  0.7 - 4.0 K/uL Final  . Monocytes  Relative 01/02/2019 14  % Final  . Monocytes Absolute 01/02/2019 0.6  0.1 - 1.0 K/uL Final  . Eosinophils Relative 01/02/2019 0  % Final  . Eosinophils Absolute 01/02/2019 0.0  0.0 - 0.5 K/uL Final  . Basophils Relative 01/02/2019 0  % Final  . Basophils Absolute 01/02/2019 0.0  0.0 - 0.1 K/uL Final  . Immature Granulocytes 01/02/2019 1  % Final  . Abs Immature Granulocytes 01/02/2019 0.04  0.00 - 0.07 K/uL Final   Performed at [facility], [address], [place], [place] [address]    Assessment:  Paul Heal. is a 85 y.o. 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  Bone marrow on 09/22/2017 revealed a hypercellular for age with dyspoietic changes variably involving myeloid cell lines, but with main involvement of the granulocytic/monocytic cell line. This was associated with bone marrow monocytosis and borderline number to slight increase in blastic cells. The overall changes favor a primary myeloid neoplasm, particularly a myelodysplastic syndrome especially refractory anemia with excess blasts (RAEB-1) or possibly refractory cytopenia with multilineage dysplasia. Consideration was also given to an evolving myelodysplastic/myeloproliferative neoplasm such as chronic myelomonocytic leukemia but the lack of absolute peripheral monocytosis precluded such a diagnosis at this time. Flow cytometry was negative.  Cytogenetics were normal (46, XY). Foundation one was positive for ASXL1 O277AJ*28, EZH2 Splice site 786-7E>H, RUNX1 [id number].  IPSS-R 4.5, intermediate group.  Anemia work-up on 08/17/2017: Vitamin B12 (658), folate (22.0), and TSH (2.688). Retic was 2.6%.  Haptoglobin was 116 on 08/19/2017.  Epo level was 104.8 on 10/05/2017.  Ferritin has been followed:  106 on 08/17/2017, 131 on 11/04/2017, 144 on 05/30/2018, 394 on 09/01/2018, 441 on 11/28/2018, and 394 on 01/02/2019.  Iron saturation was 34% on 08/17/2017 and 89% on 09/01/2018.  Bone marrow on 09/22/2017 revealed a hypercellular for age with  dyspoietic changes variably involving myeloid cell lines, but with main involvement of the granulocytic/monocytic cell line. This was associated with bone marrow monocytosis and borderline number to slight increase in blasts (5%). The overall changes favor a primary  myeloid neoplasm, particularly a myelodysplastic syndrome especially refractory anemia with excess blasts (RAEB-1) or possibly refractory cytopenia with multilineage dysplasia. Consideration was also given to an evolving myelodysplastic/myeloproliferative neoplasm such as chronic myelomonocytic leukemia but the lack of absolute peripheral monocytosis precluded such a diagnosis at this time. Flow cytometry was negative.  Cytogenetics were normal (46, XY). Foundation One was positive for ASXL1 [id number]*60, EZH2 Splice site 630-1S>W, RUNX1 [id number].  He has received 12 cycles of Vidaza (11/04/2017 - 08/22/2018; 10/24/2018 - 11/28/2018).  He has received Aranesp (initially 150 mcg every 2 weeks post chemotherapy then 300 mcg every 1-2 weeks after cycle #3; last 09/19/2018).  He last received Aranesp on 11/14/2018.  He has received 1-2 units of PRBCs with each cycle (last 12/06/2018).  He has received 13 units of PRBCs since 08/17/2017.  Bone marrow on 08/12/2018 revealed a hypercellular marrow with dyspoietic changes.  There was a borderline number of blasts (5%) seen by morphology. The findings were similar to previous biopsy although the cellularity is slightly higher in the current material.consistent with previously known myelodysplastic syndrome.  Flow cytometry was negative.  Cytogenetics were normal (46, XY).  Symptomatically, he is doing well.  He denies any complaints.  Exam reveals no adenopathy or hepatosplenomegaly.  Plan: 1.  Labs today:  CBC with diff, CMP, ferritin, hold tube.   2.   Refractory anemia with excess blasts (RAEB-1)             Clinically he continues to do well.             Goal of therapy:  decrease transfusion  dependence and prevent acceleration of disease (blasts).             Discuss plan to continue Vidaza every 5-6 weeks as tolerated.             Continue Aranesp every 2 weeks to decrease transfusion dependence (last 12/26/2018).              Begin cycle #13 Vidaza (day 1-5) today.             Discuss day 5 Vidaza in [place] as [facility] closed on 01/06/2019 only. 3.   Iron overload             Patient has received 13 units of PRBCs since 08/17/2017.             Discuss following ferritin with each cycle of treatment.  Discuss plan to initiate oral chelation therapy in near future. 4.   Labs on 01/05/2019- hemoglobin and hold tube.    If hemoglobin <= 7.0, transfuse PRBCs on 01/06/2019. 5.   RTC in 1 weeks for labs (CBC with diff, hold tube), +/- Aranesp. 6.   RTC in 3 weeks for labs (CBC with diff, hold tube), +/- Aranesp. 7.   RTC in 5 weeks for MD assessment, labs (CBC with diff, CMP, ferritin, hold tube) and cycle #14 Vidaza (day 1-5) and +/- Aranesp.    I discussed the assessment and treatment plan with the patient.  The patient was provided an opportunity to ask questions and all were answered.  The patient agreed with the plan and demonstrated an understanding of the instructions.  The patient was advised to call back if the symptoms worsen or if the condition fails to improve as anticipated.   Nolon Stalls, MD, PhD    01/02/2019, 9:56 AM  I, Cloyde Reams Dorshimer, am acting as Education administrator for [employer]. Mike Gip, MD, PhD.  I,  Melissa C. Mike Gip, MD, have reviewed the above documentation for accuracy and completeness, and I agree with the above.

## 2019-01-02 ENCOUNTER — Inpatient Hospital Stay: Payer: Medicare Other

## 2019-01-02 ENCOUNTER — Inpatient Hospital Stay: Payer: Medicare Other | Attending: Hematology and Oncology | Admitting: Hematology and Oncology

## 2019-01-02 ENCOUNTER — Other Ambulatory Visit: Payer: Self-pay

## 2019-01-02 VITALS — BP 130/62 | HR 85 | Temp 99.0°F | Resp 16 | Wt 177.4 lb

## 2019-01-02 DIAGNOSIS — Z5111 Encounter for antineoplastic chemotherapy: Secondary | ICD-10-CM | POA: Insufficient documentation

## 2019-01-02 DIAGNOSIS — I10 Essential (primary) hypertension: Secondary | ICD-10-CM | POA: Diagnosis not present

## 2019-01-02 DIAGNOSIS — E785 Hyperlipidemia, unspecified: Secondary | ICD-10-CM | POA: Diagnosis not present

## 2019-01-02 DIAGNOSIS — D469 Myelodysplastic syndrome, unspecified: Secondary | ICD-10-CM

## 2019-01-02 DIAGNOSIS — D649 Anemia, unspecified: Secondary | ICD-10-CM

## 2019-01-02 DIAGNOSIS — E119 Type 2 diabetes mellitus without complications: Secondary | ICD-10-CM | POA: Diagnosis not present

## 2019-01-02 DIAGNOSIS — D630 Anemia in neoplastic disease: Secondary | ICD-10-CM

## 2019-01-02 DIAGNOSIS — D4621 Refractory anemia with excess of blasts 1: Secondary | ICD-10-CM | POA: Insufficient documentation

## 2019-01-02 DIAGNOSIS — Z79899 Other long term (current) drug therapy: Secondary | ICD-10-CM | POA: Diagnosis not present

## 2019-01-02 LAB — CBC WITH DIFFERENTIAL/PLATELET
Abs Immature Granulocytes: 0.04 10*3/uL (ref 0.00–0.07)
Basophils Absolute: 0 10*3/uL (ref 0.0–0.1)
Basophils Relative: 0 %
Eosinophils Absolute: 0 10*3/uL (ref 0.0–0.5)
Eosinophils Relative: 0 %
HCT: 26.2 % — ABNORMAL LOW (ref 39.0–52.0)
Hemoglobin: 7.4 g/dL — ABNORMAL LOW (ref 13.0–17.0)
Immature Granulocytes: 1 %
Lymphocytes Relative: 31 %
Lymphs Abs: 1.4 10*3/uL (ref 0.7–4.0)
MCH: 26.4 pg (ref 26.0–34.0)
MCHC: 28.2 g/dL — ABNORMAL LOW (ref 30.0–36.0)
MCV: 93.6 fL (ref 80.0–100.0)
Monocytes Absolute: 0.6 10*3/uL (ref 0.1–1.0)
Monocytes Relative: 14 %
Neutro Abs: 2.3 10*3/uL (ref 1.7–7.7)
Neutrophils Relative %: 54 %
Platelets: 241 10*3/uL (ref 150–400)
RBC: 2.8 MIL/uL — ABNORMAL LOW (ref 4.22–5.81)
RDW: 19.3 % — ABNORMAL HIGH (ref 11.5–15.5)
WBC: 4.4 10*3/uL (ref 4.0–10.5)
nRBC: 0.9 % — ABNORMAL HIGH (ref 0.0–0.2)

## 2019-01-02 LAB — SAMPLE TO BLOOD BANK

## 2019-01-02 LAB — COMPREHENSIVE METABOLIC PANEL
ALT: 16 U/L (ref 0–44)
AST: 20 U/L (ref 15–41)
Albumin: 4.2 g/dL (ref 3.5–5.0)
Alkaline Phosphatase: 28 U/L — ABNORMAL LOW (ref 38–126)
Anion gap: 7 (ref 5–15)
BUN: 19 mg/dL (ref 8–23)
CO2: 26 mmol/L (ref 22–32)
Calcium: 8.8 mg/dL — ABNORMAL LOW (ref 8.9–10.3)
Chloride: 101 mmol/L (ref 98–111)
Creatinine, Ser: 0.72 mg/dL (ref 0.61–1.24)
GFR calc Af Amer: >60 mL/min (ref >60)
GFR calc non Af Amer: >60 mL/min (ref >60)
Glucose, Bld: 282 mg/dL — ABNORMAL HIGH (ref 70–99)
Potassium: 4.4 mmol/L (ref 3.5–5.1)
Sodium: 134 mmol/L — ABNORMAL LOW (ref 135–145)
Total Bilirubin: 0.8 mg/dL (ref 0.3–1.2)
Total Protein: 6.9 g/dL (ref 6.5–8.1)

## 2019-01-02 LAB — FERRITIN: Ferritin: 394 ng/mL — ABNORMAL HIGH (ref 24–336)

## 2019-01-02 MED ORDER — ONDANSETRON HCL 4 MG PO TABS: 8.0000 mg | ORAL_TABLET | Freq: Once | ORAL | Status: DC

## 2019-01-02 MED ORDER — AZACITIDINE CHEMO SQ INJECTION
75.0000 mg/m2 | Freq: Once | INTRAMUSCULAR | Status: AC
  Administered 2019-01-02: 155 mg via SUBCUTANEOUS
  Filled 2019-01-02: qty 6.2

## 2019-01-02 NOTE — Progress Notes (Signed)
Pt here for follow up. Denies any concerns at this time.  

## 2019-01-03 ENCOUNTER — Other Ambulatory Visit: Payer: Self-pay

## 2019-01-03 ENCOUNTER — Inpatient Hospital Stay: Payer: Medicare Other

## 2019-01-03 VITALS — BP 136/63 | HR 80 | Temp 97.1°F | Resp 16

## 2019-01-03 DIAGNOSIS — Z5111 Encounter for antineoplastic chemotherapy: Secondary | ICD-10-CM | POA: Diagnosis not present

## 2019-01-03 DIAGNOSIS — D469 Myelodysplastic syndrome, unspecified: Secondary | ICD-10-CM

## 2019-01-03 MED ORDER — AZACITIDINE CHEMO SQ INJECTION
75.0000 mg/m2 | Freq: Once | INTRAMUSCULAR | Status: AC
  Administered 2019-01-03: 155 mg via SUBCUTANEOUS
  Filled 2019-01-03: qty 6.2

## 2019-01-03 MED ORDER — ONDANSETRON HCL 4 MG PO TABS: 8.0000 mg | ORAL_TABLET | Freq: Once | ORAL | Status: DC

## 2019-01-03 NOTE — Patient Instructions (Signed)
Azacitidine suspension for injection (subcutaneous use)  What is this medicine?  AZACITIDINE (ay za SITE i deen) is a chemotherapy drug. This medicine reduces the growth of cancer cells and can suppress the immune system. It is used for treating myelodysplastic syndrome or some types of leukemia.  This medicine may be used for other purposes; ask your health care provider or pharmacist if you have questions.  COMMON BRAND NAME(S): Vidaza  What should I tell my health care provider before I take this medicine?  They need to know if you have any of these conditions:  -kidney disease  -liver disease  -liver tumors  -an unusual or allergic reaction to azacitidine, mannitol, other medicines, foods, dyes, or preservatives  -pregnant or trying to get pregnant  -breast-feeding  How should I use this medicine?  This medicine is for injection under the skin. It is administered in a hospital or clinic by a specially trained health care professional.  Talk to your pediatrician regarding the use of this medicine in children. While this drug may be prescribed for selected conditions, precautions do apply.  Overdosage: If you think you have taken too much of this medicine contact a poison control center or emergency room at once.  NOTE: This medicine is only for you. Do not share this medicine with others.  What if I miss a dose?  It is important not to miss your dose. Call your doctor or health care professional if you are unable to keep an appointment.  What may interact with this medicine?  Interactions have not been studied.  Give your health care provider a list of all the medicines, herbs, non-prescription drugs, or dietary supplements you use. Also tell them if you smoke, drink alcohol, or use illegal drugs. Some items may interact with your medicine.  This list may not describe all possible interactions. Give your health care provider a list of all the medicines, herbs, non-prescription drugs, or dietary supplements you  use. Also tell them if you smoke, drink alcohol, or use illegal drugs. Some items may interact with your medicine.  What should I watch for while using this medicine?  Visit your doctor for checks on your progress. This drug may make you feel generally unwell. This is not uncommon, as chemotherapy can affect healthy cells as well as cancer cells. Report any side effects. Continue your course of treatment even though you feel ill unless your doctor tells you to stop.  In some cases, you may be given additional medicines to help with side effects. Follow all directions for their use.  Call your doctor or health care professional for advice if you get a fever, chills or sore throat, or other symptoms of a cold or flu. Do not treat yourself. This drug decreases your body's ability to fight infections. Try to avoid being around people who are sick.  This medicine may increase your risk to bruise or bleed. Call your doctor or health care professional if you notice any unusual bleeding.  You may need blood work done while you are taking this medicine.  Do not become pregnant while taking this medicine and for 6 months after the last dose. Women should inform their doctor if they wish to become pregnant or think they might be pregnant. Men should not father a child while taking this medicine and for 3 months after the last dose. There is a potential for serious side effects to an unborn child. Talk to your health care professional or pharmacist for   more information. Do not breast-feed an infant while taking this medicine and for 1 week after the last dose.  This medicine may interfere with the ability to have a child. Talk with your doctor or health care professional if you are concerned about your fertility.  What side effects may I notice from receiving this medicine?  Side effects that you should report to your doctor or health care professional as soon as possible:  -allergic reactions like skin rash, itching or hives,  swelling of the face, lips, or tongue  -low blood counts - this medicine may decrease the number of white blood cells, red blood cells and platelets. You may be at increased risk for infections and bleeding.  -signs of infection - fever or chills, cough, sore throat, pain passing urine  -signs of decreased platelets or bleeding - bruising, pinpoint red spots on the skin, black, tarry stools, blood in the urine  -signs of decreased red blood cells - unusually weak or tired, fainting spells, lightheadedness  -signs and symptoms of kidney injury like trouble passing urine or change in the amount of urine  -signs and symptoms of liver injury like dark yellow or brown urine; general ill feeling or flu-like symptoms; light-colored stools; loss of appetite; nausea; right upper belly pain; unusually weak or tired; yellowing of the eyes or skin  Side effects that usually do not require medical attention (report to your doctor or health care professional if they continue or are bothersome):  -constipation  -diarrhea  -nausea, vomiting  -pain or redness at the injection site  -unusually weak or tired  This list may not describe all possible side effects. Call your doctor for medical advice about side effects. You may report side effects to FDA at 1-800-FDA-1088.  Where should I keep my medicine?  This drug is given in a hospital or clinic and will not be stored at home.  NOTE: This sheet is a summary. It may not cover all possible information. If you have questions about this medicine, talk to your doctor, pharmacist, or health care provider.   2019 Elsevier/Gold Standard (2016-09-15 14:37:51)

## 2019-01-04 ENCOUNTER — Inpatient Hospital Stay: Payer: Medicare Other

## 2019-01-04 VITALS — BP 126/59 | HR 73 | Temp 98.3°F | Resp 17

## 2019-01-04 DIAGNOSIS — Z5111 Encounter for antineoplastic chemotherapy: Secondary | ICD-10-CM | POA: Diagnosis not present

## 2019-01-04 DIAGNOSIS — D469 Myelodysplastic syndrome, unspecified: Secondary | ICD-10-CM

## 2019-01-04 MED ORDER — ONDANSETRON HCL 4 MG PO TABS: 8.0000 mg | ORAL_TABLET | Freq: Once | ORAL | Status: DC

## 2019-01-04 MED ORDER — AZACITIDINE CHEMO SQ INJECTION
75.0000 mg/m2 | Freq: Once | INTRAMUSCULAR | Status: AC
  Administered 2019-01-04: 155 mg via SUBCUTANEOUS
  Filled 2019-01-04: qty 6.2

## 2019-01-05 ENCOUNTER — Inpatient Hospital Stay: Payer: Medicare Other

## 2019-01-05 ENCOUNTER — Other Ambulatory Visit: Payer: Self-pay

## 2019-01-05 VITALS — BP 119/61 | HR 68 | Temp 97.8°F | Resp 18

## 2019-01-05 DIAGNOSIS — D649 Anemia, unspecified: Secondary | ICD-10-CM

## 2019-01-05 DIAGNOSIS — D469 Myelodysplastic syndrome, unspecified: Secondary | ICD-10-CM

## 2019-01-05 DIAGNOSIS — D630 Anemia in neoplastic disease: Secondary | ICD-10-CM

## 2019-01-05 DIAGNOSIS — Z5111 Encounter for antineoplastic chemotherapy: Secondary | ICD-10-CM | POA: Diagnosis not present

## 2019-01-05 LAB — PREPARE RBC (CROSSMATCH)

## 2019-01-05 LAB — HEMOGLOBIN: Hemoglobin: 7 g/dL — ABNORMAL LOW (ref 13.0–17.0)

## 2019-01-05 LAB — SAMPLE TO BLOOD BANK

## 2019-01-05 MED ORDER — AZACITIDINE CHEMO SQ INJECTION
75.0000 mg/m2 | Freq: Once | INTRAMUSCULAR | Status: AC
  Administered 2019-01-05: 155 mg via SUBCUTANEOUS
  Filled 2019-01-05: qty 6.2

## 2019-01-05 MED ORDER — ONDANSETRON HCL 4 MG PO TABS: 8.0000 mg | ORAL_TABLET | Freq: Once | ORAL | Status: DC

## 2019-01-05 NOTE — Patient Instructions (Signed)
Azacitidine suspension for injection (subcutaneous use)  What is this medicine?  AZACITIDINE (ay za SITE i deen) is a chemotherapy drug. This medicine reduces the growth of cancer cells and can suppress the immune system. It is used for treating myelodysplastic syndrome or some types of leukemia.  This medicine may be used for other purposes; ask your health care provider or pharmacist if you have questions.  COMMON BRAND NAME(S): Vidaza  What should I tell my health care provider before I take this medicine?  They need to know if you have any of these conditions:  -kidney disease  -liver disease  -liver tumors  -an unusual or allergic reaction to azacitidine, mannitol, other medicines, foods, dyes, or preservatives  -pregnant or trying to get pregnant  -breast-feeding  How should I use this medicine?  This medicine is for injection under the skin. It is administered in a hospital or clinic by a specially trained health care professional.  Talk to your pediatrician regarding the use of this medicine in children. While this drug may be prescribed for selected conditions, precautions do apply.  Overdosage: If you think you have taken too much of this medicine contact a poison control center or emergency room at once.  NOTE: This medicine is only for you. Do not share this medicine with others.  What if I miss a dose?  It is important not to miss your dose. Call your doctor or health care professional if you are unable to keep an appointment.  What may interact with this medicine?  Interactions have not been studied.  Give your health care provider a list of all the medicines, herbs, non-prescription drugs, or dietary supplements you use. Also tell them if you smoke, drink alcohol, or use illegal drugs. Some items may interact with your medicine.  This list may not describe all possible interactions. Give your health care provider a list of all the medicines, herbs, non-prescription drugs, or dietary supplements you  use. Also tell them if you smoke, drink alcohol, or use illegal drugs. Some items may interact with your medicine.  What should I watch for while using this medicine?  Visit your doctor for checks on your progress. This drug may make you feel generally unwell. This is not uncommon, as chemotherapy can affect healthy cells as well as cancer cells. Report any side effects. Continue your course of treatment even though you feel ill unless your doctor tells you to stop.  In some cases, you may be given additional medicines to help with side effects. Follow all directions for their use.  Call your doctor or health care professional for advice if you get a fever, chills or sore throat, or other symptoms of a cold or flu. Do not treat yourself. This drug decreases your body's ability to fight infections. Try to avoid being around people who are sick.  This medicine may increase your risk to bruise or bleed. Call your doctor or health care professional if you notice any unusual bleeding.  You may need blood work done while you are taking this medicine.  Do not become pregnant while taking this medicine and for 6 months after the last dose. Women should inform their doctor if they wish to become pregnant or think they might be pregnant. Men should not father a child while taking this medicine and for 3 months after the last dose. There is a potential for serious side effects to an unborn child. Talk to your health care professional or pharmacist for   more information. Do not breast-feed an infant while taking this medicine and for 1 week after the last dose.  This medicine may interfere with the ability to have a child. Talk with your doctor or health care professional if you are concerned about your fertility.  What side effects may I notice from receiving this medicine?  Side effects that you should report to your doctor or health care professional as soon as possible:  -allergic reactions like skin rash, itching or hives,  swelling of the face, lips, or tongue  -low blood counts - this medicine may decrease the number of white blood cells, red blood cells and platelets. You may be at increased risk for infections and bleeding.  -signs of infection - fever or chills, cough, sore throat, pain passing urine  -signs of decreased platelets or bleeding - bruising, pinpoint red spots on the skin, black, tarry stools, blood in the urine  -signs of decreased red blood cells - unusually weak or tired, fainting spells, lightheadedness  -signs and symptoms of kidney injury like trouble passing urine or change in the amount of urine  -signs and symptoms of liver injury like dark yellow or brown urine; general ill feeling or flu-like symptoms; light-colored stools; loss of appetite; nausea; right upper belly pain; unusually weak or tired; yellowing of the eyes or skin  Side effects that usually do not require medical attention (report to your doctor or health care professional if they continue or are bothersome):  -constipation  -diarrhea  -nausea, vomiting  -pain or redness at the injection site  -unusually weak or tired  This list may not describe all possible side effects. Call your doctor for medical advice about side effects. You may report side effects to FDA at 1-800-FDA-1088.  Where should I keep my medicine?  This drug is given in a hospital or clinic and will not be stored at home.  NOTE: This sheet is a summary. It may not cover all possible information. If you have questions about this medicine, talk to your doctor, pharmacist, or health care provider.   2019 Elsevier/Gold Standard (2016-09-15 14:37:51)

## 2019-01-05 NOTE — Progress Notes (Signed)
Patient's hemoglobin was 7.0 today.  He has an appointment tomorrow, in [place], for [place] and possible PRBCs so he is aware that he may get a transfusion if Dr. Mike Gip enters the orders.

## 2019-01-06 ENCOUNTER — Inpatient Hospital Stay: Payer: Medicare Other

## 2019-01-06 ENCOUNTER — Other Ambulatory Visit: Payer: Self-pay

## 2019-01-06 VITALS — BP 132/62 | HR 63 | Temp 96.5°F | Resp 20

## 2019-01-06 DIAGNOSIS — D630 Anemia in neoplastic disease: Secondary | ICD-10-CM

## 2019-01-06 DIAGNOSIS — Z5111 Encounter for antineoplastic chemotherapy: Secondary | ICD-10-CM | POA: Diagnosis not present

## 2019-01-06 DIAGNOSIS — D469 Myelodysplastic syndrome, unspecified: Secondary | ICD-10-CM

## 2019-01-06 MED ORDER — DIPHENHYDRAMINE HCL 25 MG PO CAPS
25.0000 mg | ORAL_CAPSULE | Freq: Once | ORAL | Status: AC
  Administered 2019-01-06: 10:00:00 25 mg via ORAL
  Filled 2019-01-06: qty 1

## 2019-01-06 MED ORDER — ACETAMINOPHEN 325 MG PO TABS
650.0000 mg | ORAL_TABLET | Freq: Once | ORAL | Status: AC
  Administered 2019-01-06: 650 mg via ORAL
  Filled 2019-01-06: qty 2

## 2019-01-06 MED ORDER — AZACITIDINE CHEMO SQ INJECTION
75.0000 mg/m2 | Freq: Once | INTRAMUSCULAR | Status: AC
  Administered 2019-01-06: 155 mg via SUBCUTANEOUS
  Filled 2019-01-06: qty 6.2

## 2019-01-06 MED ORDER — SODIUM CHLORIDE 0.9% IV SOLUTION
250.0000 mL | Freq: Once | INTRAVENOUS | Status: AC
  Administered 2019-01-06: 250 mL via INTRAVENOUS
  Filled 2019-01-06: qty 250

## 2019-01-06 MED ORDER — ONDANSETRON HCL 4 MG PO TABS: 8.0000 mg | ORAL_TABLET | Freq: Once | ORAL | Status: DC

## 2019-01-07 LAB — TYPE AND SCREEN
ABO/RH(D): B POS
Antibody Screen: NEGATIVE
Unit division: 0

## 2019-01-07 LAB — BPAM RBC
Blood Product Expiration Date: 202005192359
ISSUE DATE / TIME: 202005081013
Unit Type and Rh: 5100

## 2019-01-08 ENCOUNTER — Encounter: Payer: Self-pay | Admitting: Hematology and Oncology

## 2019-01-09 ENCOUNTER — Other Ambulatory Visit: Payer: Self-pay

## 2019-01-09 ENCOUNTER — Inpatient Hospital Stay: Payer: Medicare Other

## 2019-01-09 VITALS — BP 138/59 | HR 86 | Temp 98.5°F | Resp 20

## 2019-01-09 DIAGNOSIS — Z5111 Encounter for antineoplastic chemotherapy: Secondary | ICD-10-CM | POA: Diagnosis not present

## 2019-01-09 DIAGNOSIS — D469 Myelodysplastic syndrome, unspecified: Secondary | ICD-10-CM

## 2019-01-09 DIAGNOSIS — D649 Anemia, unspecified: Secondary | ICD-10-CM

## 2019-01-09 LAB — CBC WITH DIFFERENTIAL/PLATELET
Abs Immature Granulocytes: 0.15 10*3/uL — ABNORMAL HIGH (ref 0.00–0.07)
Basophils Absolute: 0 10*3/uL (ref 0.0–0.1)
Basophils Relative: 0 %
Eosinophils Absolute: 0 10*3/uL (ref 0.0–0.5)
Eosinophils Relative: 0 %
HCT: 27.3 % — ABNORMAL LOW (ref 39.0–52.0)
Hemoglobin: 8.2 g/dL — ABNORMAL LOW (ref 13.0–17.0)
Immature Granulocytes: 3 %
Lymphocytes Relative: 23 %
Lymphs Abs: 1.3 10*3/uL (ref 0.7–4.0)
MCH: 27.4 pg (ref 26.0–34.0)
MCHC: 30 g/dL (ref 30.0–36.0)
MCV: 91.3 fL (ref 80.0–100.0)
Monocytes Absolute: 0.5 10*3/uL (ref 0.1–1.0)
Monocytes Relative: 10 %
Neutro Abs: 3.6 10*3/uL (ref 1.7–7.7)
Neutrophils Relative %: 64 %
Platelets: 251 10*3/uL (ref 150–400)
RBC: 2.99 MIL/uL — ABNORMAL LOW (ref 4.22–5.81)
RDW: 17.7 % — ABNORMAL HIGH (ref 11.5–15.5)
WBC: 5.5 10*3/uL (ref 4.0–10.5)
nRBC: 0 % (ref 0.0–0.2)

## 2019-01-09 LAB — SAMPLE TO BLOOD BANK

## 2019-01-09 MED ORDER — DARBEPOETIN ALFA 300 MCG/0.6ML IJ SOSY
300.0000 ug | PREFILLED_SYRINGE | Freq: Once | INTRAMUSCULAR | Status: AC
  Administered 2019-01-09: 300 ug via SUBCUTANEOUS

## 2019-01-09 NOTE — Patient Instructions (Signed)

## 2019-01-24 ENCOUNTER — Other Ambulatory Visit: Payer: Self-pay | Admitting: Hematology and Oncology

## 2019-01-24 ENCOUNTER — Inpatient Hospital Stay: Payer: Medicare Other

## 2019-01-24 ENCOUNTER — Other Ambulatory Visit: Payer: Self-pay

## 2019-01-24 VITALS — BP 126/62 | HR 71 | Temp 98.1°F | Resp 18

## 2019-01-24 DIAGNOSIS — E1165 Type 2 diabetes mellitus with hyperglycemia: Secondary | ICD-10-CM | POA: Insufficient documentation

## 2019-01-24 DIAGNOSIS — D469 Myelodysplastic syndrome, unspecified: Secondary | ICD-10-CM

## 2019-01-24 DIAGNOSIS — H526 Other disorders of refraction: Secondary | ICD-10-CM | POA: Insufficient documentation

## 2019-01-24 DIAGNOSIS — Z5111 Encounter for antineoplastic chemotherapy: Secondary | ICD-10-CM | POA: Diagnosis not present

## 2019-01-24 DIAGNOSIS — D649 Anemia, unspecified: Secondary | ICD-10-CM

## 2019-01-24 LAB — CBC WITH DIFFERENTIAL/PLATELET
Abs Immature Granulocytes: 0.03 10*3/uL (ref 0.00–0.07)
Basophils Absolute: 0 10*3/uL (ref 0.0–0.1)
Basophils Relative: 0 %
Eosinophils Absolute: 0 10*3/uL (ref 0.0–0.5)
Eosinophils Relative: 0 %
HCT: 23.4 % — ABNORMAL LOW (ref 39.0–52.0)
Hemoglobin: 6.8 g/dL — ABNORMAL LOW (ref 13.0–17.0)
Immature Granulocytes: 1 %
Lymphocytes Relative: 43 %
Lymphs Abs: 1.3 10*3/uL (ref 0.7–4.0)
MCH: 27.1 pg (ref 26.0–34.0)
MCHC: 29.1 g/dL — ABNORMAL LOW (ref 30.0–36.0)
MCV: 93.2 fL (ref 80.0–100.0)
Monocytes Absolute: 0.3 10*3/uL (ref 0.1–1.0)
Monocytes Relative: 10 %
Neutro Abs: 1.4 10*3/uL — ABNORMAL LOW (ref 1.7–7.7)
Neutrophils Relative %: 46 %
Platelets: 135 10*3/uL — ABNORMAL LOW (ref 150–400)
RBC: 2.51 MIL/uL — ABNORMAL LOW (ref 4.22–5.81)
RDW: 19 % — ABNORMAL HIGH (ref 11.5–15.5)
WBC: 3 10*3/uL — ABNORMAL LOW (ref 4.0–10.5)
nRBC: 0.7 % — ABNORMAL HIGH (ref 0.0–0.2)

## 2019-01-24 LAB — SAMPLE TO BLOOD BANK

## 2019-01-24 MED ORDER — DARBEPOETIN ALFA 300 MCG/0.6ML IJ SOSY
300.0000 ug | PREFILLED_SYRINGE | Freq: Once | INTRAMUSCULAR | Status: AC
  Administered 2019-01-24: 300 ug via SUBCUTANEOUS
  Filled 2019-01-24: qty 0.6

## 2019-01-24 NOTE — Progress Notes (Signed)
Hemoglobin 6.8 today. Patient is feeling weak with mild dyspnea. MD notified and orders for patient to receive 1 unit of PRBCs on Thurs 5/28 at [place]. Received Aranesp today as ordered.

## 2019-01-25 ENCOUNTER — Other Ambulatory Visit: Payer: Self-pay

## 2019-01-25 DIAGNOSIS — D469 Myelodysplastic syndrome, unspecified: Secondary | ICD-10-CM

## 2019-01-25 LAB — PREPARE RBC (CROSSMATCH)

## 2019-01-26 ENCOUNTER — Inpatient Hospital Stay: Payer: Medicare Other

## 2019-01-26 ENCOUNTER — Other Ambulatory Visit: Payer: Self-pay

## 2019-01-26 DIAGNOSIS — D469 Myelodysplastic syndrome, unspecified: Secondary | ICD-10-CM

## 2019-01-26 DIAGNOSIS — Z5111 Encounter for antineoplastic chemotherapy: Secondary | ICD-10-CM | POA: Diagnosis not present

## 2019-01-26 MED ORDER — ACETAMINOPHEN 325 MG PO TABS
650.0000 mg | ORAL_TABLET | Freq: Once | ORAL | Status: AC
  Administered 2019-01-26: 650 mg via ORAL
  Filled 2019-01-26: qty 2

## 2019-01-26 MED ORDER — DIPHENHYDRAMINE HCL 25 MG PO CAPS
25.0000 mg | ORAL_CAPSULE | Freq: Once | ORAL | Status: AC
  Administered 2019-01-26: 10:00:00 25 mg via ORAL
  Filled 2019-01-26: qty 1

## 2019-01-26 MED ORDER — SODIUM CHLORIDE 0.9% IV SOLUTION
250.0000 mL | Freq: Once | INTRAVENOUS | Status: AC
  Administered 2019-01-26: 250 mL via INTRAVENOUS
  Filled 2019-01-26: qty 250

## 2019-01-26 NOTE — Patient Instructions (Signed)
Blood Transfusion, Adult, Care After This sheet gives you information about how to care for yourself after your procedure. Your doctor may also give you more specific instructions. If you have problems or questions, contact your doctor. Follow these instructions at home:   Take over-the-counter and prescription medicines only as told by your doctor.  Go back to your normal activities as told by your doctor.  Follow instructions from your doctor about how to take care of the area where an IV tube was put into your vein (insertion site). Make sure you: ? Wash your hands with soap and water before you change your bandage (dressing). If there is no soap and water, use hand sanitizer. ? Change your bandage as told by your doctor.  Check your IV insertion site every day for signs of infection. Check for: ? More redness, swelling, or pain. ? More fluid or blood. ? Warmth. ? Pus or a bad smell. Contact a doctor if:  You have more redness, swelling, or pain around the IV insertion site.  You have more fluid or blood coming from the IV insertion site.  Your IV insertion site feels warm to the touch.  You have pus or a bad smell coming from the IV insertion site.  Your pee (urine) turns pink, red, or brown.  You feel weak after doing your normal activities. Get help right away if:  You have signs of a serious allergic or body defense (immune) system reaction, including: ? Itchiness. ? Hives. ? Trouble breathing. ? Anxiety. ? Pain in your chest or lower back. ? Fever, flushing, and chills. ? Fast pulse. ? Rash. ? Watery poop (diarrhea). ? Throwing up (vomiting). ? Dark pee. ? Serious headache. ? Dizziness. ? Stiff neck. ? Yellow color in your face or the white parts of your eyes (jaundice). Summary  After a blood transfusion, return to your normal activities as told by your doctor.  Every day, check for signs of infection where the IV tube was put into your vein.  Some  signs of infection are warm skin, more redness and pain, more fluid or blood, and pus or a bad smell where the needle went in.  Contact your doctor if you feel weak or have any unusual symptoms. This information is not intended to replace advice given to you by your health care provider. Make sure you discuss any questions you have with your health care provider. Document Released: 09/07/2014 Document Revised: 04/10/2016 Document Reviewed: 04/10/2016 Elsevier Interactive Patient Education  2019 Elsevier Inc.  

## 2019-01-27 LAB — TYPE AND SCREEN
ABO/RH(D): B POS
Antibody Screen: NEGATIVE
Unit division: 0

## 2019-01-27 LAB — BPAM RBC
Blood Product Expiration Date: 202006232359
ISSUE DATE / TIME: 202005281035
Unit Type and Rh: 7300

## 2019-02-02 NOTE — Progress Notes (Signed)
[facility]  [address] [place], [place] [address] Phone: [phone]  Fax: [phone]   Clinic Day:  02/06/2019  Referring physician: Tracie Harrier, MD  Chief Complaint: Paul Pham. is a 85 y.o. male with RAEB-1 who is seen for assessment prior to cycle #14Vidaza.  HPI: The patient was last seen in the hematology clinic on 01/02/2019. At that time, he was doing well.  He denied any complaints.  Exam revealed no adenopathy or hepatosplenomegaly.  Hemoglobin was 7.4, hematocrit 26.2, ferritin 394. He began day 1 cycle #13 Viadaza.  He received Aranesp on 01/09/2019 and 01/24/2019.   He received 1 unit of PRBCs on 01/26/2019.   Labs followed:  01/05/2019:  Hemoglobin 7.0.  01/09/2019:  WBC 5,500 (ANC 3,600), Hemoglobin 8.2, hematocrit 27.3. 01/24/2019:  WBC 3,000 (ANC 1,400), Hemoglobin 6.8, hematocrit 23.4, platelets 135,000.   During the interim, he has been well.  He denies any complaints, and can easily do ADLs. He has been sleeping and eating well. He denies any pain, nausea, or vomiting. He denies any issues with his bowels.  He takes a vitamin supplement containing calcium daily. His diabetes is well-controlled.    Past Medical History:  Diagnosis Date  . Anemia   . Arthritis   . BPH (benign prostatic hypertrophy)   . Complication of anesthesia    nausea  . Diabetes type 2, controlled ([place])   . HOH (hard of hearing)    r and L ears-70% loss per pt  . Hyperlipidemia   . Hypertension     Past Surgical History:  Procedure Laterality Date  . APPENDECTOMY    . COLONOSCOPY  09/21/08   1 polyp found, tubular adenoma  . HAND SURGERY    . KNEE SURGERY Left     Family History  Problem Relation Age of Onset  . Aneurysm Mother   . Heart disease Father   . Cancer Brother        skin  . Heart disease Brother   . Heart disease Brother   . Cancer Sister        skin  . Aneurysm Brother   . Heart disease Brother   .  Heart disease Sister   . Heart disease Sister   . COPD Sister   . Diabetes Sister     Social History:  reports that he has never smoked. He has never used smokeless tobacco. He reports that he does not drink alcohol or use drugs. 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 Patient denies known exposures to radiation on toxins. 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 He had his 65-year marriage anniversary on 08/23/2018. He is walking 1 2/3 miles per day.  The patient is alone today, with his wife over the Ipad.   Allergies: No Known Allergies  Current Medications: Current Outpatient Medications  Medication Sig Dispense Refill  . aspirin 81 MG chewable tablet Chew 81 mg by mouth daily.    . Cranberry (THERACRAN PO) Take 1 tablet by mouth daily.     . cyanocobalamin 1000 MCG tablet Take 1,000 mcg by mouth daily.    . finasteride (PROSCAR) 5 MG tablet Take 5 mg by mouth daily.    Marland Kitchen glimepiride (AMARYL) 1 MG tablet Take 1 tablet by mouth daily after breakfast.    . glucose blood test strip FreeStyle Lite Strips    . LANCETS ULTRA FINE MISC Lancets,Ultra Thin 26 gauge    .  lisinopril (PRINIVIL,ZESTRIL) 10 MG tablet Take 10 mg by mouth daily.    . meclizine (ANTIVERT) 25 MG tablet Take 25 mg by mouth 2 (two) times daily as needed.     . metformin (FORTAMET) 1000 MG (OSM) 24 hr tablet Take 1,000 mg by mouth 2 (two) times daily with a meal.    . Multiple Vitamins-Minerals (CENTRUM SILVER PO) Take by mouth.    . Omega-3 Fatty Acids (FISH OIL PO) Take 1 tablet by mouth daily.     [name] (PROSTATE SR PO) Take 1 tablet by mouth daily.     . simvastatin (ZOCOR) 40 MG tablet Take 40 mg by mouth daily.    Marland Kitchen loperamide (IMODIUM) 2 MG capsule Take 1 capsule (2 mg total) by mouth See admin instructions. With onset of loose stool, take 4mg  followed by 2mg  every 2 hours until loose bowel movement stopped. Maximum: 16 mg/day (Patient not taking: Reported on 11/28/2018) 30  capsule 0  . ondansetron (ZOFRAN) 4 MG tablet Take 1 tablet (4 mg total) by mouth every 6 (six) hours as needed for nausea or vomiting. (Patient not taking: Reported on 10/24/2018) 30 tablet 0  . Polyethylene Glycol 3350 (MIRALAX PO) Take by mouth.     No current facility-administered medications for this visit.     Review of Systems  Constitutional: Negative.  Negative for chills, diaphoresis, fever, malaise/fatigue (energy levels good) and weight loss (stable).       Doing well.  HENT: Positive for hearing loss. Negative for congestion, ear pain, nosebleeds, sinus pain and sore throat.   Eyes: Negative.  Negative for blurred vision, double vision, photophobia and pain.  Respiratory: Negative.  Negative for cough, sputum production, shortness of breath and wheezing.   Cardiovascular: Negative.  Negative for chest pain, palpitations, orthopnea, leg swelling and PND.  Gastrointestinal: Negative.  Negative for abdominal pain, blood in stool, constipation, diarrhea, melena, nausea and vomiting.       Eating well.   Genitourinary: Negative.  Negative for dysuria, frequency, hematuria and urgency.  Musculoskeletal: Negative.  Negative for back pain, myalgias and neck pain.  Skin: Negative.  Negative for itching and rash.  Neurological: Negative.  Negative for dizziness, tingling, sensory change, focal weakness, weakness and headaches.  Endo/Heme/Allergies: Does not bruise/bleed easily.       Diabetes.  Psychiatric/Behavioral: Negative.  Negative for depression and memory loss. The patient is not nervous/anxious and does not have insomnia.   All other systems reviewed and are negative.  Performance status (ECOG): 0  Physical Exam  Constitutional: He is oriented to person, place, and time. He appears well-developed and well-nourished. No distress.  HENT:  Head: Normocephalic and atraumatic.  Mouth/Throat: Oropharynx is clear and moist. No oropharyngeal exudate.  Wearing a hat and face mask.   Hearing aid.  Sparse short gray hair.  Near alopecia/male pattern baldness.   Eyes: Pupils are equal, round, and reactive to light. Conjunctivae and EOM are normal. No scleral icterus.  Gold rimmed glasses.  Blue eyes.  Neck: Normal range of motion. Neck supple. No JVD present.  Cardiovascular: Normal rate, regular rhythm and normal heart sounds. Exam reveals no gallop and no friction rub.  No murmur heard. Pulmonary/Chest: Effort normal and breath sounds normal. No respiratory distress. He has no wheezes. He has no rales.  Abdominal: Soft. Bowel sounds are normal. He exhibits no distension and no mass. There is no abdominal tenderness. There is no rebound and no guarding.  Musculoskeletal: Normal range of  motion.        General: No tenderness or edema.  Lymphadenopathy:    He has no cervical adenopathy.    He has no axillary adenopathy.       Right: No supraclavicular adenopathy present.       Left: No supraclavicular adenopathy present.  Neurological: He is alert and oriented to person, place, and time. He has normal reflexes.  Skin: Skin is warm and dry. No rash noted. He is not diaphoretic. No erythema. No pallor.  Psychiatric: He has a normal mood and affect. His behavior is normal. Judgment and thought content normal.  Nursing note and vitals reviewed.   Appointment on 02/06/2019  Component Date Value Ref Range Status  . Sodium 02/06/2019 135  135 - 145 mmol/L Final  . Potassium 02/06/2019 4.3  3.5 - 5.1 mmol/L Final  . Chloride 02/06/2019 103  98 - 111 mmol/L Final  . CO2 02/06/2019 25  22 - 32 mmol/L Final  . Glucose, Bld 02/06/2019 148* 70 - 99 mg/dL Final  . BUN 02/06/2019 18  8 - 23 mg/dL Final  . Creatinine, Ser 02/06/2019 0.79  0.61 - 1.24 mg/dL Final  . Calcium 02/06/2019 8.7* 8.9 - 10.3 mg/dL Final  . Total Protein 02/06/2019 7.2  6.5 - 8.1 g/dL Final  . Albumin 02/06/2019 4.1  3.5 - 5.0 g/dL Final  . AST 02/06/2019 19  15 - 41 U/L Final  . ALT 02/06/2019 15  0 - 44  U/L Final  . Alkaline Phosphatase 02/06/2019 25* 38 - 126 U/L Final  . Total Bilirubin 02/06/2019 0.8  0.3 - 1.2 mg/dL Final  . GFR calc non Af Amer 02/06/2019 >60  >60 mL/min Final  . GFR calc Af Amer 02/06/2019 >60  >60 mL/min Final  . Anion gap 02/06/2019 7  5 - 15 Final   Performed at [facility], [address] [place], [place] [address]  . WBC 02/06/2019 4.6  4.0 - 10.5 K/uL Final  . RBC 02/06/2019 2.97* 4.22 - 5.81 MIL/uL Final  . Hemoglobin 02/06/2019 8.1* 13.0 - 17.0 g/dL Final  . HCT 02/06/2019 27.7* 39.0 - 52.0 % Final  . MCV 02/06/2019 93.3  80.0 - 100.0 fL Final  . MCH 02/06/2019 27.3  26.0 - 34.0 pg Final  . MCHC 02/06/2019 29.2* 30.0 - 36.0 g/dL Final  . RDW 02/06/2019 18.0* 11.5 - 15.5 % Final  . Platelets 02/06/2019 249  150 - 400 K/uL Final  . nRBC 02/06/2019 0.0  0.0 - 0.2 % Final  . Neutrophils Relative % 02/06/2019 51  % Final  . Neutro Abs 02/06/2019 2.4  1.7 - 7.7 K/uL Final  . Lymphocytes Relative 02/06/2019 33  % Final  . Lymphs Abs 02/06/2019 1.5  0.7 - 4.0 K/uL Final  . Monocytes Relative 02/06/2019 14  % Final  . Monocytes Absolute 02/06/2019 0.6  0.1 - 1.0 K/uL Final  . Eosinophils Relative 02/06/2019 0  % Final  . Eosinophils Absolute 02/06/2019 0.0  0.0 - 0.5 K/uL Final  . Basophils Relative 02/06/2019 0  % Final  . Basophils Absolute 02/06/2019 0.0  0.0 - 0.1 K/uL Final  . Immature Granulocytes 02/06/2019 2  % Final  . Abs Immature Granulocytes 02/06/2019 0.07  0.00 - 0.07 K/uL Final   Performed at [facility], [address] [place], [place] [address]    Assessment:  Paul Pham. is a 85 y.o. 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 Bone marrowon 09/22/2017 revealed a hypercellular  for age with dyspoietic changes variably involving myeloid cell lines, but with main involvement of the granulocytic/monocytic cell line. This was associated with bone marrow monocytosis and borderline number to slight increase in blastic cells. The overall  changes favor a primary myeloid neoplasm, particularly a myelodysplastic syndrome especially refractory anemia with excess blasts (RAEB-1) or possibly refractory cytopenia with multilineage dysplasia. Consideration was also given to an evolving myelodysplastic/myeloproliferative neoplasm such as chronic myelomonocytic leukemia but the lack of absolute peripheral monocytosis precluded such a diagnosis at this time. Flow cytometrywas negative. Cytogeneticswere normal (46, XY). Foundation one was positive for ASXL1 T701XB*93, EZH2 Splice site 903-0S>P, RUNX1 [id number]. IPSS-R4.5, intermediate group.  Anemia work-up on 08/17/2017: Vitamin B12 (658), folate (22.0), and TSH (2.688). Retic was 2.6%. Haptoglobin was 116 on 08/19/2017. Epo levelwas 104.8 on 10/05/2017.  Ferritin has been followed: 106 on 08/17/2017, 131 on 11/04/2017, 144 on 05/30/2018, 394 on 09/01/2018, 441 on 11/28/2018, 394 on 01/02/2019, and 480 on 02/06/2019. Iron saturation was 34% on 08/17/2017 and 89% on 09/01/2018.  Bone marrowon 09/22/2017 revealed a hypercellular for age with dyspoietic changes variably involving myeloid cell lines, but with main involvement of the granulocytic/monocytic cell line. This was associated with bone marrow monocytosis and borderline number to slight increase in blasts (5%). The overall changes favor a primary myeloid neoplasm, particularly a myelodysplastic syndrome especially refractory anemia with excess blasts (RAEB-1) or possibly refractory cytopenia with multilineage dysplasia. Consideration was also given to an evolving myelodysplastic/myeloproliferative neoplasm such as chronic myelomonocytic leukemia but the lack of absolute peripheral monocytosis precluded such a diagnosis at this time. Flow cytometrywas negative. Cytogeneticswere normal (46, XY). Foundation One was positive for ASXL1 Q330QT*62, EZH2 Splice site 263-3H>L, RUNX1 [id number].  He has received 13cycles  ofVidaza(11/04/2017 - 08/22/2018; 10/24/2018 - 01/02/2019). He has received Aranesp(initially 150 mcg every 2 weeks post chemotherapy then 300 mcg every 1-2 weeks after cycle #3; last 09/19/2018). He last received Aranesp on 01/24/2019. He has received 1-2 units of PRBCswith each cycle (last 01/26/2019).He has received 16 units of PRBCs since 08/17/2017.  Bone marrowon 08/12/2018 revealed a hypercellular marrow with dyspoietic changes. There was a borderline number of blasts (5%) seen by morphology. The findings were similar to previous biopsy although the cellularity is slightly higher in the current material.consistent with previously known myelodysplastic syndrome. Flow cytometry was negative. Cytogenetics were normal (46, XY).  Symptomatically, he is doing well.  He voices no concerns.  Exam reveal no adenopathy or hepatosplenomegaly.  Hemoglobin is 8.1.  Platelets 249,000.  WBC 4600 ([place] 2400).  Plan: 1.   Labs today: CBC with diff, CMP, ferritin, hold tube.  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 Goal of therapy: decrease transfusion dependence and prevent acceleration of disease (blasts). Continue Vidaza every 5-6 weeks as tolerated. Continue Aranesp every 2 weeks to decrease transfusion dependence (last 01/24/2019).  Begin cycle #14 Vidaza (day 1-5) today.  Day 5 Vidaza will be in the [facility] as the [facility] is closed on Fridays until 03/10/2019.  3. Iron overload Patient has received 16 units of blood since 08/17/2017 (last 01/26/2019). Continue to monitor ferritin with plan to initiate oral chelating agent.   4.Aranesp today. 5.   RTC in 2 weeks for labs (CBC with diff, hold tube) and +/- Aranesp. 6.   RTC in 4 weeks for labs (CBC with diff, hold tube), and +/- Aranesp. 7.RTC in 5 weeks for MD assessment, labs  (CBC with diff, CMP, ferritin, hold tube), and  cycle #15 Vidaza (day 1-5).   I discussed the assessment and treatment plan with the patient.  The patient was provided an opportunity to ask questions and all were answered.  The patient agreed with the plan and demonstrated an understanding of the instructions.  The patient was advised to call back if the symptoms worsen or if the condition fails to improve as anticipated.   Lequita Asal, MD, PhD    02/06/2019, 11:03 AM  I, Molly Dorshimer, am acting as Education administrator for [employer]. Mike Gip, MD, PhD.  I, Melissa C. Mike Gip, MD, have reviewed the above documentation for accuracy and completeness, and I agree with the above.

## 2019-02-06 ENCOUNTER — Inpatient Hospital Stay: Payer: Medicare Other

## 2019-02-06 ENCOUNTER — Encounter: Payer: Self-pay | Admitting: Hematology and Oncology

## 2019-02-06 ENCOUNTER — Inpatient Hospital Stay: Payer: Medicare Other | Attending: Hematology and Oncology | Admitting: Hematology and Oncology

## 2019-02-06 ENCOUNTER — Other Ambulatory Visit: Payer: Self-pay

## 2019-02-06 VITALS — BP 150/56 | HR 74 | Temp 98.3°F | Resp 18 | Wt 177.0 lb

## 2019-02-06 DIAGNOSIS — E119 Type 2 diabetes mellitus without complications: Secondary | ICD-10-CM | POA: Insufficient documentation

## 2019-02-06 DIAGNOSIS — D630 Anemia in neoplastic disease: Secondary | ICD-10-CM

## 2019-02-06 DIAGNOSIS — D469 Myelodysplastic syndrome, unspecified: Secondary | ICD-10-CM

## 2019-02-06 DIAGNOSIS — Z5111 Encounter for antineoplastic chemotherapy: Secondary | ICD-10-CM | POA: Diagnosis present

## 2019-02-06 DIAGNOSIS — D4621 Refractory anemia with excess of blasts 1: Secondary | ICD-10-CM | POA: Diagnosis not present

## 2019-02-06 DIAGNOSIS — E785 Hyperlipidemia, unspecified: Secondary | ICD-10-CM | POA: Insufficient documentation

## 2019-02-06 DIAGNOSIS — I10 Essential (primary) hypertension: Secondary | ICD-10-CM | POA: Insufficient documentation

## 2019-02-06 DIAGNOSIS — Z79899 Other long term (current) drug therapy: Secondary | ICD-10-CM | POA: Insufficient documentation

## 2019-02-06 DIAGNOSIS — D649 Anemia, unspecified: Secondary | ICD-10-CM

## 2019-02-06 LAB — CBC WITH DIFFERENTIAL/PLATELET
Abs Immature Granulocytes: 0.07 10*3/uL (ref 0.00–0.07)
Basophils Absolute: 0 10*3/uL (ref 0.0–0.1)
Basophils Relative: 0 %
Eosinophils Absolute: 0 10*3/uL (ref 0.0–0.5)
Eosinophils Relative: 0 %
HCT: 27.7 % — ABNORMAL LOW (ref 39.0–52.0)
Hemoglobin: 8.1 g/dL — ABNORMAL LOW (ref 13.0–17.0)
Immature Granulocytes: 2 %
Lymphocytes Relative: 33 %
Lymphs Abs: 1.5 10*3/uL (ref 0.7–4.0)
MCH: 27.3 pg (ref 26.0–34.0)
MCHC: 29.2 g/dL — ABNORMAL LOW (ref 30.0–36.0)
MCV: 93.3 fL (ref 80.0–100.0)
Monocytes Absolute: 0.6 10*3/uL (ref 0.1–1.0)
Monocytes Relative: 14 %
Neutro Abs: 2.4 10*3/uL (ref 1.7–7.7)
Neutrophils Relative %: 51 %
Platelets: 249 10*3/uL (ref 150–400)
RBC: 2.97 MIL/uL — ABNORMAL LOW (ref 4.22–5.81)
RDW: 18 % — ABNORMAL HIGH (ref 11.5–15.5)
WBC: 4.6 10*3/uL (ref 4.0–10.5)
nRBC: 0 % (ref 0.0–0.2)

## 2019-02-06 LAB — SAMPLE TO BLOOD BANK

## 2019-02-06 LAB — COMPREHENSIVE METABOLIC PANEL
ALT: 15 U/L (ref 0–44)
AST: 19 U/L (ref 15–41)
Albumin: 4.1 g/dL (ref 3.5–5.0)
Alkaline Phosphatase: 25 U/L — ABNORMAL LOW (ref 38–126)
Anion gap: 7 (ref 5–15)
BUN: 18 mg/dL (ref 8–23)
CO2: 25 mmol/L (ref 22–32)
Calcium: 8.7 mg/dL — ABNORMAL LOW (ref 8.9–10.3)
Chloride: 103 mmol/L (ref 98–111)
Creatinine, Ser: 0.79 mg/dL (ref 0.61–1.24)
GFR calc Af Amer: >60 mL/min (ref >60)
GFR calc non Af Amer: >60 mL/min (ref >60)
Glucose, Bld: 148 mg/dL — ABNORMAL HIGH (ref 70–99)
Potassium: 4.3 mmol/L (ref 3.5–5.1)
Sodium: 135 mmol/L (ref 135–145)
Total Bilirubin: 0.8 mg/dL (ref 0.3–1.2)
Total Protein: 7.2 g/dL (ref 6.5–8.1)

## 2019-02-06 LAB — FERRITIN: Ferritin: 480 ng/mL — ABNORMAL HIGH (ref 24–336)

## 2019-02-06 MED ORDER — AZACITIDINE CHEMO SQ INJECTION
75.0000 mg/m2 | Freq: Once | INTRAMUSCULAR | Status: AC
  Administered 2019-02-06: 155 mg via SUBCUTANEOUS
  Filled 2019-02-06: qty 6.2

## 2019-02-06 MED ORDER — ONDANSETRON HCL 4 MG PO TABS: 8.0000 mg | ORAL_TABLET | Freq: Once | ORAL | Status: DC

## 2019-02-06 MED ORDER — DARBEPOETIN ALFA 300 MCG/0.6ML IJ SOSY
300.0000 ug | PREFILLED_SYRINGE | Freq: Once | INTRAMUSCULAR | Status: AC
  Administered 2019-02-06: 300 ug via SUBCUTANEOUS

## 2019-02-06 NOTE — Progress Notes (Signed)
Pt here for follow up. Denies any concerns,

## 2019-02-06 NOTE — Patient Instructions (Signed)
Azacitidine suspension for injection (subcutaneous use) What is this medicine? AZACITIDINE (ay za SITE i deen) is a chemotherapy drug. This medicine reduces the growth of cancer cells and can suppress the immune system. It is used for treating myelodysplastic syndrome or some types of leukemia. This medicine may be used for other purposes; ask your health care provider or pharmacist if you have questions. COMMON BRAND NAME(S): Vidaza What should I tell my health care provider before I take this medicine? They need to know if you have any of these conditions: -kidney disease -liver disease -liver tumors -an unusual or allergic reaction to azacitidine, mannitol, other medicines, foods, dyes, or preservatives -pregnant or trying to get pregnant -breast-feeding How should I use this medicine? This medicine is for injection under the skin. It is administered in a hospital or clinic by a specially trained health care professional. Talk to your pediatrician regarding the use of this medicine in children. While this drug may be prescribed for selected conditions, precautions do apply. Overdosage: If you think you have taken too much of this medicine contact a poison control center or emergency room at once. NOTE: This medicine is only for you. Do not share this medicine with others. What if I miss a dose? It is important not to miss your dose. Call your doctor or health care professional if you are unable to keep an appointment. What may interact with this medicine? Interactions have not been studied. Give your health care provider a list of all the medicines, herbs, non-prescription drugs, or dietary supplements you use. Also tell them if you smoke, drink alcohol, or use illegal drugs. Some items may interact with your medicine. This list may not describe all possible interactions. Give your health care provider a list of all the medicines, herbs, non-prescription drugs, or dietary supplements you  use. Also tell them if you smoke, drink alcohol, or use illegal drugs. Some items may interact with your medicine. What should I watch for while using this medicine? Visit your doctor for checks on your progress. This drug may make you feel generally unwell. This is not uncommon, as chemotherapy can affect healthy cells as well as cancer cells. Report any side effects. Continue your course of treatment even though you feel ill unless your doctor tells you to stop. In some cases, you may be given additional medicines to help with side effects. Follow all directions for their use. Call your doctor or health care professional for advice if you get a fever, chills or sore throat, or other symptoms of a cold or flu. Do not treat yourself. This drug decreases your body's ability to fight infections. Try to avoid being around people who are sick. This medicine may increase your risk to bruise or bleed. Call your doctor or health care professional if you notice any unusual bleeding. You may need blood work done while you are taking this medicine. Do not become pregnant while taking this medicine and for 6 months after the last dose. Women should inform their doctor if they wish to become pregnant or think they might be pregnant. Men should not father a child while taking this medicine and for 3 months after the last dose. There is a potential for serious side effects to an unborn child. Talk to your health care professional or pharmacist for more information. Do not breast-feed an infant while taking this medicine and for 1 week after the last dose. This medicine may interfere with the ability to have a child.   Talk with your doctor or health care professional if you are concerned about your fertility. What side effects may I notice from receiving this medicine? Side effects that you should report to your doctor or health care professional as soon as possible: -allergic reactions like skin rash, itching or hives,  swelling of the face, lips, or tongue -low blood counts - this medicine may decrease the number of white blood cells, red blood cells and platelets. You may be at increased risk for infections and bleeding. -signs of infection - fever or chills, cough, sore throat, pain passing urine -signs of decreased platelets or bleeding - bruising, pinpoint red spots on the skin, black, tarry stools, blood in the urine -signs of decreased red blood cells - unusually weak or tired, fainting spells, lightheadedness -signs and symptoms of kidney injury like trouble passing urine or change in the amount of urine -signs and symptoms of liver injury like dark yellow or brown urine; general ill feeling or flu-like symptoms; light-colored stools; loss of appetite; nausea; right upper belly pain; unusually weak or tired; yellowing of the eyes or skin Side effects that usually do not require medical attention (report to your doctor or health care professional if they continue or are bothersome): -constipation -diarrhea -nausea, vomiting -pain or redness at the injection site -unusually weak or tired This list may not describe all possible side effects. Call your doctor for medical advice about side effects. You may report side effects to FDA at 1-800-FDA-1088. Where should I keep my medicine? This drug is given in a hospital or clinic and will not be stored at home. NOTE: This sheet is a summary. It may not cover all possible information. If you have questions about this medicine, talk to your doctor, pharmacist, or health care provider.  2019 [employer] Standard (2016-09-15 14:37:51) Darbepoetin Alfa injection What is this medicine? DARBEPOETIN ALFA (dar be POE e tin AL fa) helps your body make more red blood cells. It is used to treat anemia caused by chronic kidney failure and chemotherapy. This medicine may be used for other purposes; ask your health care provider or pharmacist if you have questions. COMMON  BRAND NAME(S): Aranesp What should I tell my health care provider before I take this medicine? They need to know if you have any of these conditions: -blood clotting disorders or history of blood clots -cancer patient not on chemotherapy -cystic fibrosis -heart disease, such as angina, heart failure, or a history of a heart attack -hemoglobin level of 12 g/dL or greater -high blood pressure -low levels of folate, iron, or vitamin B12 -seizures -an unusual or allergic reaction to darbepoetin, erythropoietin, albumin, hamster proteins, latex, other medicines, foods, dyes, or preservatives -pregnant or trying to get pregnant -breast-feeding How should I use this medicine? This medicine is for injection into a vein or under the skin. It is usually given by a health care professional in a hospital or clinic setting. If you get this medicine at home, you will be taught how to prepare and give this medicine. Use exactly as directed. Take your medicine at regular intervals. Do not take your medicine more often than directed. It is important that you put your used needles and syringes in a special sharps container. Do not put them in a trash can. If you do not have a sharps container, call your pharmacist or healthcare provider to get one. A special MedGuide will be given to you by the pharmacist with each prescription and refill. Be sure to  read this information carefully each time. Talk to your pediatrician regarding the use of this medicine in children. While this medicine may be used in children as young as 42 month of age for selected conditions, precautions do apply. Overdosage: If you think you have taken too much of this medicine contact a poison control center or emergency room at once. NOTE: This medicine is only for you. Do not share this medicine with others. What if I miss a dose? If you miss a dose, take it as soon as you can. If it is almost time for your next dose, take only that dose.  Do not take double or extra doses. What may interact with this medicine? Do not take this medicine with any of the following medications: -epoetin alfa This list may not describe all possible interactions. Give your health care provider a list of all the medicines, herbs, non-prescription drugs, or dietary supplements you use. Also tell them if you smoke, drink alcohol, or use illegal drugs. Some items may interact with your medicine. What should I watch for while using this medicine? Your condition will be monitored carefully while you are receiving this medicine. You may need blood work done while you are taking this medicine. This medicine may cause a decrease in vitamin B6. You should make sure that you get enough vitamin B6 while you are taking this medicine. Discuss the foods you eat and the vitamins you take with your health care professional. What side effects may I notice from receiving this medicine? Side effects that you should report to your doctor or health care professional as soon as possible: -allergic reactions like skin rash, itching or hives, swelling of the face, lips, or tongue -breathing problems -changes in vision -chest pain -confusion, trouble speaking or understanding -feeling faint or lightheaded, falls -high blood pressure -muscle aches or pains -pain, swelling, warmth in the leg -rapid weight gain -severe headaches -sudden numbness or weakness of the face, arm or leg -trouble walking, dizziness, loss of balance or coordination -seizures (convulsions) -swelling of the ankles, feet, hands -unusually weak or tired Side effects that usually do not require medical attention (report to your doctor or health care professional if they continue or are bothersome): -diarrhea -fever, chills (flu-like symptoms) -headaches -nausea, vomiting -redness, stinging, or swelling at site where injected This list may not describe all possible side effects. Call your doctor for  medical advice about side effects. You may report side effects to FDA at 1-800-FDA-1088. Where should I keep my medicine? Keep out of the reach of children. Store in a refrigerator between 2 and 8 degrees C (36 and 46 degrees F). Do not freeze. Do not shake. Throw away any unused portion if using a single-dose vial. Throw away any unused medicine after the expiration date. NOTE: This sheet is a summary. It may not cover all possible information. If you have questions about this medicine, talk to your doctor, pharmacist, or health care provider.  2019 [employer] Standard (2017-09-01 16:44:20)

## 2019-02-07 ENCOUNTER — Inpatient Hospital Stay: Payer: Medicare Other

## 2019-02-07 VITALS — BP 137/63 | HR 80 | Temp 98.0°F | Resp 18

## 2019-02-07 DIAGNOSIS — D469 Myelodysplastic syndrome, unspecified: Secondary | ICD-10-CM

## 2019-02-07 DIAGNOSIS — Z5111 Encounter for antineoplastic chemotherapy: Secondary | ICD-10-CM | POA: Diagnosis not present

## 2019-02-07 MED ORDER — ONDANSETRON HCL 4 MG PO TABS
8.0000 mg | ORAL_TABLET | Freq: Once | ORAL | Status: AC
  Administered 2019-02-07: 8 mg via ORAL
  Filled 2019-02-07: qty 2

## 2019-02-07 MED ORDER — AZACITIDINE CHEMO SQ INJECTION
75.0000 mg/m2 | Freq: Once | INTRAMUSCULAR | Status: AC
  Administered 2019-02-07: 155 mg via SUBCUTANEOUS
  Filled 2019-02-07: qty 6.2

## 2019-02-08 ENCOUNTER — Other Ambulatory Visit: Payer: Self-pay

## 2019-02-08 ENCOUNTER — Inpatient Hospital Stay: Payer: Medicare Other | Attending: Hematology and Oncology

## 2019-02-08 VITALS — BP 156/64 | HR 88 | Temp 98.6°F | Resp 18

## 2019-02-08 DIAGNOSIS — D4621 Refractory anemia with excess of blasts 1: Secondary | ICD-10-CM | POA: Insufficient documentation

## 2019-02-08 DIAGNOSIS — Z79899 Other long term (current) drug therapy: Secondary | ICD-10-CM | POA: Insufficient documentation

## 2019-02-08 DIAGNOSIS — D469 Myelodysplastic syndrome, unspecified: Secondary | ICD-10-CM | POA: Insufficient documentation

## 2019-02-08 MED ORDER — AZACITIDINE CHEMO SQ INJECTION
75.0000 mg/m2 | Freq: Once | INTRAMUSCULAR | Status: AC
  Administered 2019-02-08: 155 mg via SUBCUTANEOUS
  Filled 2019-02-08: qty 6.2

## 2019-02-08 MED ORDER — ONDANSETRON HCL 4 MG PO TABS: 8.0000 mg | ORAL_TABLET | Freq: Once | ORAL | Status: DC

## 2019-02-08 NOTE — Patient Instructions (Signed)
Azacitidine suspension for injection (subcutaneous use)  What is this medicine?  AZACITIDINE (ay za SITE i deen) is a chemotherapy drug. This medicine reduces the growth of cancer cells and can suppress the immune system. It is used for treating myelodysplastic syndrome or some types of leukemia.  This medicine may be used for other purposes; ask your health care provider or pharmacist if you have questions.  COMMON BRAND NAME(S): Vidaza  What should I tell my health care provider before I take this medicine?  They need to know if you have any of these conditions:  -kidney disease  -liver disease  -liver tumors  -an unusual or allergic reaction to azacitidine, mannitol, other medicines, foods, dyes, or preservatives  -pregnant or trying to get pregnant  -breast-feeding  How should I use this medicine?  This medicine is for injection under the skin. It is administered in a hospital or clinic by a specially trained health care professional.  Talk to your pediatrician regarding the use of this medicine in children. While this drug may be prescribed for selected conditions, precautions do apply.  Overdosage: If you think you have taken too much of this medicine contact a poison control center or emergency room at once.  NOTE: This medicine is only for you. Do not share this medicine with others.  What if I miss a dose?  It is important not to miss your dose. Call your doctor or health care professional if you are unable to keep an appointment.  What may interact with this medicine?  Interactions have not been studied.  Give your health care provider a list of all the medicines, herbs, non-prescription drugs, or dietary supplements you use. Also tell them if you smoke, drink alcohol, or use illegal drugs. Some items may interact with your medicine.  This list may not describe all possible interactions. Give your health care provider a list of all the medicines, herbs, non-prescription drugs, or dietary supplements you  use. Also tell them if you smoke, drink alcohol, or use illegal drugs. Some items may interact with your medicine.  What should I watch for while using this medicine?  Visit your doctor for checks on your progress. This drug may make you feel generally unwell. This is not uncommon, as chemotherapy can affect healthy cells as well as cancer cells. Report any side effects. Continue your course of treatment even though you feel ill unless your doctor tells you to stop.  In some cases, you may be given additional medicines to help with side effects. Follow all directions for their use.  Call your doctor or health care professional for advice if you get a fever, chills or sore throat, or other symptoms of a cold or flu. Do not treat yourself. This drug decreases your body's ability to fight infections. Try to avoid being around people who are sick.  This medicine may increase your risk to bruise or bleed. Call your doctor or health care professional if you notice any unusual bleeding.  You may need blood work done while you are taking this medicine.  Do not become pregnant while taking this medicine and for 6 months after the last dose. Women should inform their doctor if they wish to become pregnant or think they might be pregnant. Men should not father a child while taking this medicine and for 3 months after the last dose. There is a potential for serious side effects to an unborn child. Talk to your health care professional or pharmacist for   more information. Do not breast-feed an infant while taking this medicine and for 1 week after the last dose.  This medicine may interfere with the ability to have a child. Talk with your doctor or health care professional if you are concerned about your fertility.  What side effects may I notice from receiving this medicine?  Side effects that you should report to your doctor or health care professional as soon as possible:  -allergic reactions like skin rash, itching or hives,  swelling of the face, lips, or tongue  -low blood counts - this medicine may decrease the number of white blood cells, red blood cells and platelets. You may be at increased risk for infections and bleeding.  -signs of infection - fever or chills, cough, sore throat, pain passing urine  -signs of decreased platelets or bleeding - bruising, pinpoint red spots on the skin, black, tarry stools, blood in the urine  -signs of decreased red blood cells - unusually weak or tired, fainting spells, lightheadedness  -signs and symptoms of kidney injury like trouble passing urine or change in the amount of urine  -signs and symptoms of liver injury like dark yellow or brown urine; general ill feeling or flu-like symptoms; light-colored stools; loss of appetite; nausea; right upper belly pain; unusually weak or tired; yellowing of the eyes or skin  Side effects that usually do not require medical attention (report to your doctor or health care professional if they continue or are bothersome):  -constipation  -diarrhea  -nausea, vomiting  -pain or redness at the injection site  -unusually weak or tired  This list may not describe all possible side effects. Call your doctor for medical advice about side effects. You may report side effects to FDA at 1-800-FDA-1088.  Where should I keep my medicine?  This drug is given in a hospital or clinic and will not be stored at home.  NOTE: This sheet is a summary. It may not cover all possible information. If you have questions about this medicine, talk to your doctor, pharmacist, or health care provider.   2019 Elsevier/Gold Standard (2016-09-15 14:37:51)

## 2019-02-09 ENCOUNTER — Inpatient Hospital Stay: Payer: Medicare Other

## 2019-02-09 VITALS — BP 116/64 | HR 73 | Temp 97.4°F | Resp 18

## 2019-02-09 DIAGNOSIS — Z5111 Encounter for antineoplastic chemotherapy: Secondary | ICD-10-CM | POA: Diagnosis not present

## 2019-02-09 DIAGNOSIS — D469 Myelodysplastic syndrome, unspecified: Secondary | ICD-10-CM

## 2019-02-09 DIAGNOSIS — D630 Anemia in neoplastic disease: Secondary | ICD-10-CM

## 2019-02-09 MED ORDER — AZACITIDINE CHEMO SQ INJECTION
75.0000 mg/m2 | Freq: Once | INTRAMUSCULAR | Status: AC
  Administered 2019-02-09: 155 mg via SUBCUTANEOUS
  Filled 2019-02-09: qty 6.2

## 2019-02-09 MED ORDER — ONDANSETRON HCL 4 MG PO TABS: 8.0000 mg | ORAL_TABLET | Freq: Once | ORAL | Status: DC

## 2019-02-09 NOTE — Addendum Note (Signed)
Addended by: Zara Chess on: 02/09/2019 11:36 AM   Modules accepted: Orders

## 2019-02-09 NOTE — Patient Instructions (Signed)
Azacitidine suspension for injection (subcutaneous use)  What is this medicine?  AZACITIDINE (ay za SITE i deen) is a chemotherapy drug. This medicine reduces the growth of cancer cells and can suppress the immune system. It is used for treating myelodysplastic syndrome or some types of leukemia.  This medicine may be used for other purposes; ask your health care provider or pharmacist if you have questions.  COMMON BRAND NAME(S): Vidaza  What should I tell my health care provider before I take this medicine?  They need to know if you have any of these conditions:  -kidney disease  -liver disease  -liver tumors  -an unusual or allergic reaction to azacitidine, mannitol, other medicines, foods, dyes, or preservatives  -pregnant or trying to get pregnant  -breast-feeding  How should I use this medicine?  This medicine is for injection under the skin. It is administered in a hospital or clinic by a specially trained health care professional.  Talk to your pediatrician regarding the use of this medicine in children. While this drug may be prescribed for selected conditions, precautions do apply.  Overdosage: If you think you have taken too much of this medicine contact a poison control center or emergency room at once.  NOTE: This medicine is only for you. Do not share this medicine with others.  What if I miss a dose?  It is important not to miss your dose. Call your doctor or health care professional if you are unable to keep an appointment.  What may interact with this medicine?  Interactions have not been studied.  Give your health care provider a list of all the medicines, herbs, non-prescription drugs, or dietary supplements you use. Also tell them if you smoke, drink alcohol, or use illegal drugs. Some items may interact with your medicine.  This list may not describe all possible interactions. Give your health care provider a list of all the medicines, herbs, non-prescription drugs, or dietary supplements you  use. Also tell them if you smoke, drink alcohol, or use illegal drugs. Some items may interact with your medicine.  What should I watch for while using this medicine?  Visit your doctor for checks on your progress. This drug may make you feel generally unwell. This is not uncommon, as chemotherapy can affect healthy cells as well as cancer cells. Report any side effects. Continue your course of treatment even though you feel ill unless your doctor tells you to stop.  In some cases, you may be given additional medicines to help with side effects. Follow all directions for their use.  Call your doctor or health care professional for advice if you get a fever, chills or sore throat, or other symptoms of a cold or flu. Do not treat yourself. This drug decreases your body's ability to fight infections. Try to avoid being around people who are sick.  This medicine may increase your risk to bruise or bleed. Call your doctor or health care professional if you notice any unusual bleeding.  You may need blood work done while you are taking this medicine.  Do not become pregnant while taking this medicine and for 6 months after the last dose. Women should inform their doctor if they wish to become pregnant or think they might be pregnant. Men should not father a child while taking this medicine and for 3 months after the last dose. There is a potential for serious side effects to an unborn child. Talk to your health care professional or pharmacist for   more information. Do not breast-feed an infant while taking this medicine and for 1 week after the last dose.  This medicine may interfere with the ability to have a child. Talk with your doctor or health care professional if you are concerned about your fertility.  What side effects may I notice from receiving this medicine?  Side effects that you should report to your doctor or health care professional as soon as possible:  -allergic reactions like skin rash, itching or hives,  swelling of the face, lips, or tongue  -low blood counts - this medicine may decrease the number of white blood cells, red blood cells and platelets. You may be at increased risk for infections and bleeding.  -signs of infection - fever or chills, cough, sore throat, pain passing urine  -signs of decreased platelets or bleeding - bruising, pinpoint red spots on the skin, black, tarry stools, blood in the urine  -signs of decreased red blood cells - unusually weak or tired, fainting spells, lightheadedness  -signs and symptoms of kidney injury like trouble passing urine or change in the amount of urine  -signs and symptoms of liver injury like dark yellow or brown urine; general ill feeling or flu-like symptoms; light-colored stools; loss of appetite; nausea; right upper belly pain; unusually weak or tired; yellowing of the eyes or skin  Side effects that usually do not require medical attention (report to your doctor or health care professional if they continue or are bothersome):  -constipation  -diarrhea  -nausea, vomiting  -pain or redness at the injection site  -unusually weak or tired  This list may not describe all possible side effects. Call your doctor for medical advice about side effects. You may report side effects to FDA at 1-800-FDA-1088.  Where should I keep my medicine?  This drug is given in a hospital or clinic and will not be stored at home.  NOTE: This sheet is a summary. It may not cover all possible information. If you have questions about this medicine, talk to your doctor, pharmacist, or health care provider.   2019 Elsevier/Gold Standard (2016-09-15 14:37:51)

## 2019-02-10 ENCOUNTER — Other Ambulatory Visit: Payer: Self-pay

## 2019-02-10 ENCOUNTER — Inpatient Hospital Stay: Payer: Medicare Other

## 2019-02-10 VITALS — BP 123/57 | HR 80 | Temp 97.8°F | Resp 18

## 2019-02-10 DIAGNOSIS — D469 Myelodysplastic syndrome, unspecified: Secondary | ICD-10-CM

## 2019-02-10 DIAGNOSIS — Z5111 Encounter for antineoplastic chemotherapy: Secondary | ICD-10-CM | POA: Diagnosis not present

## 2019-02-10 MED ORDER — ONDANSETRON HCL 4 MG PO TABS: 8.0000 mg | ORAL_TABLET | Freq: Once | ORAL | Status: DC

## 2019-02-10 MED ORDER — AZACITIDINE CHEMO SQ INJECTION
75.0000 mg/m2 | Freq: Once | INTRAMUSCULAR | Status: AC
  Administered 2019-02-10: 155 mg via SUBCUTANEOUS
  Filled 2019-02-10: qty 6.2

## 2019-02-17 ENCOUNTER — Other Ambulatory Visit: Payer: Self-pay

## 2019-02-20 ENCOUNTER — Other Ambulatory Visit: Payer: Self-pay | Admitting: Hematology and Oncology

## 2019-02-20 ENCOUNTER — Ambulatory Visit: Payer: TRICARE For Life (TFL)

## 2019-02-20 ENCOUNTER — Inpatient Hospital Stay: Payer: Medicare Other

## 2019-02-20 ENCOUNTER — Other Ambulatory Visit: Payer: Self-pay

## 2019-02-20 VITALS — BP 129/57 | HR 93 | Temp 98.3°F | Resp 18

## 2019-02-20 DIAGNOSIS — D469 Myelodysplastic syndrome, unspecified: Secondary | ICD-10-CM | POA: Diagnosis not present

## 2019-02-20 DIAGNOSIS — Z5111 Encounter for antineoplastic chemotherapy: Secondary | ICD-10-CM | POA: Diagnosis not present

## 2019-02-20 DIAGNOSIS — D649 Anemia, unspecified: Secondary | ICD-10-CM

## 2019-02-20 LAB — CBC WITH DIFFERENTIAL/PLATELET
Abs Immature Granulocytes: 0.16 10*3/uL — ABNORMAL HIGH (ref 0.00–0.07)
Basophils Absolute: 0 10*3/uL (ref 0.0–0.1)
Basophils Relative: 0 %
Eosinophils Absolute: 0.1 10*3/uL (ref 0.0–0.5)
Eosinophils Relative: 1 %
HCT: 21.4 % — ABNORMAL LOW (ref 39.0–52.0)
Hemoglobin: 6.5 g/dL — ABNORMAL LOW (ref 13.0–17.0)
Immature Granulocytes: 4 %
Lymphocytes Relative: 37 %
Lymphs Abs: 1.4 10*3/uL (ref 0.7–4.0)
MCH: 27.8 pg (ref 26.0–34.0)
MCHC: 30.4 g/dL (ref 30.0–36.0)
MCV: 91.5 fL (ref 80.0–100.0)
Monocytes Absolute: 0.5 10*3/uL (ref 0.1–1.0)
Monocytes Relative: 12 %
Neutro Abs: 1.7 10*3/uL (ref 1.7–7.7)
Neutrophils Relative %: 46 %
Platelets: 146 10*3/uL — ABNORMAL LOW (ref 150–400)
RBC: 2.34 MIL/uL — ABNORMAL LOW (ref 4.22–5.81)
RDW: 18.1 % — ABNORMAL HIGH (ref 11.5–15.5)
WBC: 3.8 10*3/uL — ABNORMAL LOW (ref 4.0–10.5)
nRBC: 0.8 % — ABNORMAL HIGH (ref 0.0–0.2)

## 2019-02-20 LAB — SAMPLE TO BLOOD BANK

## 2019-02-20 LAB — PREPARE RBC (CROSSMATCH)

## 2019-02-20 MED ORDER — DARBEPOETIN ALFA 300 MCG/0.6ML IJ SOSY
300.0000 ug | PREFILLED_SYRINGE | Freq: Once | INTRAMUSCULAR | Status: AC
  Administered 2019-02-20: 300 ug via SUBCUTANEOUS

## 2019-02-21 ENCOUNTER — Inpatient Hospital Stay: Payer: Medicare Other

## 2019-02-21 DIAGNOSIS — D469 Myelodysplastic syndrome, unspecified: Secondary | ICD-10-CM | POA: Diagnosis not present

## 2019-02-21 DIAGNOSIS — Z5111 Encounter for antineoplastic chemotherapy: Secondary | ICD-10-CM | POA: Diagnosis not present

## 2019-02-21 MED ORDER — DIPHENHYDRAMINE HCL 25 MG PO CAPS
25.0000 mg | ORAL_CAPSULE | Freq: Once | ORAL | Status: AC
  Administered 2019-02-21: 25 mg via ORAL

## 2019-02-21 MED ORDER — SODIUM CHLORIDE 0.9% IV SOLUTION
250.0000 mL | Freq: Once | INTRAVENOUS | Status: AC
  Administered 2019-02-21: 10:00:00 250 mL via INTRAVENOUS
  Filled 2019-02-21: qty 250

## 2019-02-21 MED ORDER — ACETAMINOPHEN 325 MG PO TABS
650.0000 mg | ORAL_TABLET | Freq: Once | ORAL | Status: AC
  Administered 2019-02-21: 10:00:00 650 mg via ORAL

## 2019-02-21 NOTE — Patient Instructions (Signed)
Blood Transfusion, Adult, Care After This sheet gives you information about how to care for yourself after your procedure. Your doctor may also give you more specific instructions. If you have problems or questions, contact your doctor. Follow these instructions at home:   Take over-the-counter and prescription medicines only as told by your doctor.  Go back to your normal activities as told by your doctor.  Follow instructions from your doctor about how to take care of the area where an IV tube was put into your vein (insertion site). Make sure you: ? Wash your hands with soap and water before you change your bandage (dressing). If there is no soap and water, use hand sanitizer. ? Change your bandage as told by your doctor.  Check your IV insertion site every day for signs of infection. Check for: ? More redness, swelling, or pain. ? More fluid or blood. ? Warmth. ? Pus or a bad smell. Contact a doctor if:  You have more redness, swelling, or pain around the IV insertion site.  You have more fluid or blood coming from the IV insertion site.  Your IV insertion site feels warm to the touch.  You have pus or a bad smell coming from the IV insertion site.  Your pee (urine) turns pink, red, or brown.  You feel weak after doing your normal activities. Get help right away if:  You have signs of a serious allergic or body defense (immune) system reaction, including: ? Itchiness. ? Hives. ? Trouble breathing. ? Anxiety. ? Pain in your chest or lower back. ? Fever, flushing, and chills. ? Fast pulse. ? Rash. ? Watery poop (diarrhea). ? Throwing up (vomiting). ? Dark pee. ? Serious headache. ? Dizziness. ? Stiff neck. ? Yellow color in your face or the white parts of your eyes (jaundice). Summary  After a blood transfusion, return to your normal activities as told by your doctor.  Every day, check for signs of infection where the IV tube was put into your vein.  Some  signs of infection are warm skin, more redness and pain, more fluid or blood, and pus or a bad smell where the needle went in.  Contact your doctor if you feel weak or have any unusual symptoms. This information is not intended to replace advice given to you by your health care provider. Make sure you discuss any questions you have with your health care provider. Document Released: 09/07/2014 Document Revised: 04/10/2016 Document Reviewed: 04/10/2016 Elsevier Interactive Patient Education  2019 Elsevier Inc.  

## 2019-02-22 LAB — TYPE AND SCREEN
ABO/RH(D): B POS
Antibody Screen: NEGATIVE
Unit division: 0

## 2019-02-22 LAB — BPAM RBC
Blood Product Expiration Date: 202007212359
ISSUE DATE / TIME: 202006231009
Unit Type and Rh: 7300

## 2019-02-28 ENCOUNTER — Other Ambulatory Visit: Payer: Self-pay

## 2019-02-28 DIAGNOSIS — D630 Anemia in neoplastic disease: Secondary | ICD-10-CM

## 2019-02-28 DIAGNOSIS — D469 Myelodysplastic syndrome, unspecified: Secondary | ICD-10-CM

## 2019-03-02 ENCOUNTER — Other Ambulatory Visit: Payer: Self-pay

## 2019-03-06 ENCOUNTER — Inpatient Hospital Stay: Payer: Medicare Other

## 2019-03-06 ENCOUNTER — Inpatient Hospital Stay: Payer: Medicare Other | Attending: Hematology and Oncology

## 2019-03-06 ENCOUNTER — Other Ambulatory Visit: Payer: Self-pay

## 2019-03-06 VITALS — BP 129/57 | HR 78 | Temp 97.4°F | Resp 18

## 2019-03-06 DIAGNOSIS — D4621 Refractory anemia with excess of blasts 1: Secondary | ICD-10-CM | POA: Diagnosis present

## 2019-03-06 DIAGNOSIS — D469 Myelodysplastic syndrome, unspecified: Secondary | ICD-10-CM

## 2019-03-06 DIAGNOSIS — Z79899 Other long term (current) drug therapy: Secondary | ICD-10-CM | POA: Diagnosis not present

## 2019-03-06 DIAGNOSIS — E119 Type 2 diabetes mellitus without complications: Secondary | ICD-10-CM | POA: Insufficient documentation

## 2019-03-06 DIAGNOSIS — I10 Essential (primary) hypertension: Secondary | ICD-10-CM | POA: Diagnosis not present

## 2019-03-06 DIAGNOSIS — E785 Hyperlipidemia, unspecified: Secondary | ICD-10-CM | POA: Insufficient documentation

## 2019-03-06 DIAGNOSIS — Z5111 Encounter for antineoplastic chemotherapy: Secondary | ICD-10-CM | POA: Diagnosis present

## 2019-03-06 DIAGNOSIS — D630 Anemia in neoplastic disease: Secondary | ICD-10-CM

## 2019-03-06 LAB — CBC WITH DIFFERENTIAL/PLATELET
Abs Immature Granulocytes: 0.04 10*3/uL (ref 0.00–0.07)
Basophils Absolute: 0 10*3/uL (ref 0.0–0.1)
Basophils Relative: 0 %
Eosinophils Absolute: 0 10*3/uL (ref 0.0–0.5)
Eosinophils Relative: 0 %
HCT: 25.2 % — ABNORMAL LOW (ref 39.0–52.0)
Hemoglobin: 7.4 g/dL — ABNORMAL LOW (ref 13.0–17.0)
Immature Granulocytes: 1 %
Lymphocytes Relative: 33 %
Lymphs Abs: 1.3 10*3/uL (ref 0.7–4.0)
MCH: 27.7 pg (ref 26.0–34.0)
MCHC: 29.4 g/dL — ABNORMAL LOW (ref 30.0–36.0)
MCV: 94.4 fL (ref 80.0–100.0)
Monocytes Absolute: 0.4 10*3/uL (ref 0.1–1.0)
Monocytes Relative: 11 %
Neutro Abs: 2.1 10*3/uL (ref 1.7–7.7)
Neutrophils Relative %: 55 %
Platelets: 217 10*3/uL (ref 150–400)
RBC: 2.67 MIL/uL — ABNORMAL LOW (ref 4.22–5.81)
RDW: 18.6 % — ABNORMAL HIGH (ref 11.5–15.5)
WBC: 3.8 10*3/uL — ABNORMAL LOW (ref 4.0–10.5)
nRBC: 1.1 % — ABNORMAL HIGH (ref 0.0–0.2)

## 2019-03-06 LAB — SAMPLE TO BLOOD BANK

## 2019-03-06 MED ORDER — DARBEPOETIN ALFA 300 MCG/0.6ML IJ SOSY
300.0000 ug | PREFILLED_SYRINGE | Freq: Once | INTRAMUSCULAR | Status: AC
  Administered 2019-03-06: 14:00:00 300 ug via SUBCUTANEOUS

## 2019-03-06 NOTE — Patient Instructions (Signed)
Darbepoetin Alfa injection What is this medicine? DARBEPOETIN ALFA (dar be POE e tin AL fa) helps your body make more red blood cells. It is used to treat anemia caused by chronic kidney failure and chemotherapy. This medicine may be used for other purposes; ask your health care provider or pharmacist if you have questions. COMMON BRAND NAME(S): Aranesp What should I tell my health care provider before I take this medicine? They need to know if you have any of these conditions:  blood clotting disorders or history of blood clots  cancer patient not on chemotherapy  cystic fibrosis  heart disease, such as angina, heart failure, or a history of a heart attack  hemoglobin level of 12 g/dL or greater  high blood pressure  low levels of folate, iron, or vitamin B12  seizures  an unusual or allergic reaction to darbepoetin, erythropoietin, albumin, hamster proteins, latex, other medicines, foods, dyes, or preservatives  pregnant or trying to get pregnant  breast-feeding How should I use this medicine? This medicine is for injection into a vein or under the skin. It is usually given by a health care professional in a hospital or clinic setting. If you get this medicine at home, you will be taught how to prepare and give this medicine. Use exactly as directed. Take your medicine at regular intervals. Do not take your medicine more often than directed. It is important that you put your used needles and syringes in a special sharps container. Do not put them in a trash can. If you do not have a sharps container, call your pharmacist or healthcare provider to get one. A special MedGuide will be given to you by the pharmacist with each prescription and refill. Be sure to read this information carefully each time. Talk to your pediatrician regarding the use of this medicine in children. While this medicine may be used in children as young as 1 month of age for selected conditions, precautions do  apply. Overdosage: If you think you have taken too much of this medicine contact a poison control center or emergency room at once. NOTE: This medicine is only for you. Do not share this medicine with others. What if I miss a dose? If you miss a dose, take it as soon as you can. If it is almost time for your next dose, take only that dose. Do not take double or extra doses. What may interact with this medicine? Do not take this medicine with any of the following medications:  epoetin alfa This list may not describe all possible interactions. Give your health care provider a list of all the medicines, herbs, non-prescription drugs, or dietary supplements you use. Also tell them if you smoke, drink alcohol, or use illegal drugs. Some items may interact with your medicine. What should I watch for while using this medicine? Your condition will be monitored carefully while you are receiving this medicine. You may need blood work done while you are taking this medicine. This medicine may cause a decrease in vitamin B6. You should make sure that you get enough vitamin B6 while you are taking this medicine. Discuss the foods you eat and the vitamins you take with your health care professional. What side effects may I notice from receiving this medicine? Side effects that you should report to your doctor or health care professional as soon as possible:  allergic reactions like skin rash, itching or hives, swelling of the face, lips, or tongue  breathing problems  changes in   vision  chest pain  confusion, trouble speaking or understanding  feeling faint or lightheaded, falls  high blood pressure  muscle aches or pains  pain, swelling, warmth in the leg  rapid weight gain  severe headaches  sudden numbness or weakness of the face, arm or leg  trouble walking, dizziness, loss of balance or coordination  seizures (convulsions)  swelling of the ankles, feet, hands  unusually weak or  tired Side effects that usually do not require medical attention (report to your doctor or health care professional if they continue or are bothersome):  diarrhea  fever, chills (flu-like symptoms)  headaches  nausea, vomiting  redness, stinging, or swelling at site where injected This list may not describe all possible side effects. Call your doctor for medical advice about side effects. You may report side effects to FDA at 1-800-FDA-1088. Where should I keep my medicine? Keep out of the reach of children. Store in a refrigerator between 2 and 8 degrees C (36 and 46 degrees F). Do not freeze. Do not shake. Throw away any unused portion if using a single-dose vial. Throw away any unused medicine after the expiration date. NOTE: This sheet is a summary. It may not cover all possible information. If you have questions about this medicine, talk to your doctor, pharmacist, or health care provider.  2020 [employer] Standard (2017-09-01 16:44:20)  

## 2019-03-10 NOTE — Progress Notes (Signed)
[facility]  [address] [place], [place] [address] Phone: [phone]  Fax: [phone]   Clinic Day:  03/13/2019  Referring physician: Tracie Harrier, MD  Chief Complaint: Paul Abdelaziz. is a 85 y.o. male with RAEB-1 who is seen for assessment prior to cycle #15Vidaza.  HPI: The patient was last seen in the medical oncology amd hematology clinic on 02/06/2019. At that time, he was doing well.  He voiced no concerns.  Exam revealed no adenopathy or hepatosplenomegaly. Hemoglobin 8.1. Platelets 249,000. WBC 4600 ([place] 2400). He began cycle #14 Vidaza.   He received Aranesp on 02/20/2019 and 03/06/2019. He received 1 unit of PRBCs on 02/21/2019.   Labs followed: 02/20/2019: WBC 3,800, hemoglobin 6.5, hematocrit 21.4, platelets 146,000.  03/06/2019: WBC 3,800, hemoglobin 7.4, hematocrit 25.2, platelets 217,000.   During the interim, he is doing "pretty good." He denies any fevers, sweats, lumps or bumps, or weight loss. He denies any bleeding. His energy level is good and he has been doing more chores and walking a few miles each morning.   He felt "stronger" after receiving a blood transfusion. He has been taking 900mg  calcium daily.    Past Medical History:  Diagnosis Date  . Anemia   . Arthritis   . BPH (benign prostatic hypertrophy)   . Complication of anesthesia    nausea  . Diabetes type 2, controlled ([place])   . HOH (hard of hearing)    r and L ears-70% loss per pt  . Hyperlipidemia   . Hypertension     Past Surgical History:  Procedure Laterality Date  . APPENDECTOMY    . COLONOSCOPY  09/21/08   1 polyp found, tubular adenoma  . HAND SURGERY    . KNEE SURGERY Left     Family History  Problem Relation Age of Onset  . Aneurysm Mother   . Heart disease Father   . Cancer Brother        skin  . Heart disease Brother   . Heart disease Brother   . Cancer Sister        skin  . Aneurysm Brother   . Heart disease Brother    . Heart disease Sister   . Heart disease Sister   . COPD Sister   . Diabetes Sister     Social History:  reports that he has never smoked. He has never used smokeless tobacco. He reports that he does not drink alcohol or use drugs. 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 Patient denies known exposures to radiation on toxins. 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 He had his 7-year marriage anniversary on 08/23/2018.He is walking 1-2 miles per day.The patient isalonetoday, with his wife over the Ipad.   Allergies: No Known Allergies  Current Medications: Current Outpatient Medications  Medication Sig Dispense Refill  . aspirin 81 MG chewable tablet Chew 81 mg by mouth daily.    . Cranberry (THERACRAN PO) Take 1 tablet by mouth daily.     . cyanocobalamin 1000 MCG tablet Take 1,000 mcg by mouth daily.    . finasteride (PROSCAR) 5 MG tablet Take 5 mg by mouth daily.    Marland Kitchen glimepiride (AMARYL) 1 MG tablet Take 1 tablet by mouth daily after breakfast.    . glucose blood test strip FreeStyle Lite Strips    . LANCETS ULTRA FINE MISC Lancets,Ultra Thin 26 gauge    . lisinopril (PRINIVIL,ZESTRIL) 10 MG tablet Take 10  mg by mouth daily.    . meclizine (ANTIVERT) 25 MG tablet Take 25 mg by mouth 2 (two) times daily as needed.     . metformin (FORTAMET) 1000 MG (OSM) 24 hr tablet Take 1,000 mg by mouth 2 (two) times daily with a meal.    . Multiple Vitamins-Minerals (CENTRUM SILVER PO) Take by mouth.    . Omega-3 Fatty Acids (FISH OIL PO) Take 1 tablet by mouth daily.     . ondansetron (ZOFRAN) 4 MG tablet Take 1 tablet (4 mg total) by mouth every 6 (six) hours as needed for nausea or vomiting. 30 tablet 0  . Polyethylene Glycol 3350 (MIRALAX PO) Take by mouth as needed.     [name] (PROSTATE SR PO) Take 1 tablet by mouth daily.     . simvastatin (ZOCOR) 40 MG tablet Take 40 mg by mouth daily.    Marland Kitchen loperamide (IMODIUM) 2 MG capsule Take 1 capsule (2 mg  total) by mouth See admin instructions. With onset of loose stool, take 4mg  followed by 2mg  every 2 hours until loose bowel movement stopped. Maximum: 16 mg/day (Patient not taking: Reported on 11/28/2018) 30 capsule 0   No current facility-administered medications for this visit.     Review of Systems  Constitutional: Positive for weight loss (1lb). Negative for chills, diaphoresis, fever and malaise/fatigue.       Doing "pretty good." Energy level is good.  HENT: Positive for hearing loss. Negative for congestion, ear pain, nosebleeds, sinus pain and sore throat.   Eyes: Negative.  Negative for blurred vision, double vision, photophobia and pain.  Respiratory: Negative.  Negative for cough, sputum production, shortness of breath and wheezing.   Cardiovascular: Negative.  Negative for chest pain, palpitations, orthopnea, leg swelling and PND.  Gastrointestinal: Negative.  Negative for abdominal pain, blood in stool, constipation, diarrhea, melena, nausea and vomiting.       Eating well.   Genitourinary: Negative.  Negative for dysuria, frequency, hematuria and urgency.  Musculoskeletal: Negative.  Negative for back pain, myalgias and neck pain.  Skin: Negative.  Negative for itching and rash.  Neurological: Negative.  Negative for dizziness, tingling, sensory change, focal weakness, weakness and headaches.  Endo/Heme/Allergies: Does not bruise/bleed easily.       Diabetes.  Psychiatric/Behavioral: Negative.  Negative for depression and memory loss. The patient is not nervous/anxious and does not have insomnia.   All other systems reviewed and are negative.  Performance status (ECOG): 0  Vitals Blood pressure (!) 152/53, pulse 70, temperature 97.7 F (36.5 C), temperature source Tympanic, resp. rate 16, weight 176 lb 12.9 oz (80.2 kg), SpO2 100 %.   Physical Exam  Constitutional: He is oriented to person, place, and time. He appears well-developed and well-nourished. No distress.  HENT:   Head: Normocephalic and atraumatic.  Mouth/Throat: Oropharynx is clear and moist. No oropharyngeal exudate.  Hearing aid.  Sparse short gray hair.  Near alopecia/male pattern baldness. Mask.  Eyes: Pupils are equal, round, and reactive to light. Conjunctivae and EOM are normal. No scleral icterus.  Gold rimmed glasses.  Blue eyes.  Neck: Normal range of motion. Neck supple. No JVD present.  Cardiovascular: Normal rate, regular rhythm and normal heart sounds. Exam reveals no gallop and no friction rub.  No murmur heard. Pulmonary/Chest: Effort normal and breath sounds normal. No respiratory distress. He has no wheezes. He has no rales.  Abdominal: Soft. Bowel sounds are normal. He exhibits no distension and no mass. There is  no abdominal tenderness. There is no rebound and no guarding.  Musculoskeletal: Normal range of motion.        General: No tenderness or edema.  Lymphadenopathy:    He has no cervical adenopathy.    He has no axillary adenopathy.       Right: No supraclavicular adenopathy present.       Left: No supraclavicular adenopathy present.  Neurological: He is alert and oriented to person, place, and time. He has normal reflexes.  Skin: Skin is warm and dry. No rash noted. He is not diaphoretic. No erythema. No pallor.  Psychiatric: He has a normal mood and affect. His behavior is normal. Judgment and thought content normal.  Nursing note and vitals reviewed.   Appointment on 03/13/2019  Component Date Value Ref Range Status  . WBC 03/13/2019 4.4  4.0 - 10.5 K/uL Final  . RBC 03/13/2019 2.72* 4.22 - 5.81 MIL/uL Final  . Hemoglobin 03/13/2019 7.3* 13.0 - 17.0 g/dL Final  . HCT 03/13/2019 25.8* 39.0 - 52.0 % Final  . MCV 03/13/2019 94.9  80.0 - 100.0 fL Final  . MCH 03/13/2019 26.8  26.0 - 34.0 pg Final  . MCHC 03/13/2019 28.3* 30.0 - 36.0 g/dL Final  . RDW 03/13/2019 18.6* 11.5 - 15.5 % Final  . Platelets 03/13/2019 255  150 - 400 K/uL Final  . nRBC 03/13/2019 0.5* 0.0 -  0.2 % Final  . Neutrophils Relative % 03/13/2019 49  % Final  . Neutro Abs 03/13/2019 2.1  1.7 - 7.7 K/uL Final  . Lymphocytes Relative 03/13/2019 36  % Final  . Lymphs Abs 03/13/2019 1.6  0.7 - 4.0 K/uL Final  . Monocytes Relative 03/13/2019 13  % Final  . Monocytes Absolute 03/13/2019 0.6  0.1 - 1.0 K/uL Final  . Eosinophils Relative 03/13/2019 0  % Final  . Eosinophils Absolute 03/13/2019 0.0  0.0 - 0.5 K/uL Final  . Basophils Relative 03/13/2019 0  % Final  . Basophils Absolute 03/13/2019 0.0  0.0 - 0.1 K/uL Final  . Immature Granulocytes 03/13/2019 2  % Final  . Abs Immature Granulocytes 03/13/2019 0.09* 0.00 - 0.07 K/uL Final   Performed at [facility], [address], [place], [place] [address]  . Sodium 03/13/2019 135  135 - 145 mmol/L Final  . Potassium 03/13/2019 4.4  3.5 - 5.1 mmol/L Final  . Chloride 03/13/2019 102  98 - 111 mmol/L Final  . CO2 03/13/2019 26  22 - 32 mmol/L Final  . Glucose, Bld 03/13/2019 130* 70 - 99 mg/dL Final  . BUN 03/13/2019 18  8 - 23 mg/dL Final  . Creatinine, Ser 03/13/2019 0.70  0.61 - 1.24 mg/dL Final  . Calcium 03/13/2019 8.7* 8.9 - 10.3 mg/dL Final  . Total Protein 03/13/2019 6.6  6.5 - 8.1 g/dL Final  . Albumin 03/13/2019 4.0  3.5 - 5.0 g/dL Final  . AST 03/13/2019 20  15 - 41 U/L Final  . ALT 03/13/2019 16  0 - 44 U/L Final  . Alkaline Phosphatase 03/13/2019 26* 38 - 126 U/L Final  . Total Bilirubin 03/13/2019 0.7  0.3 - 1.2 mg/dL Final  . GFR calc non Af Amer 03/13/2019 >60  >60 mL/min Final  . GFR calc Af Amer 03/13/2019 >60  >60 mL/min Final  . Anion gap 03/13/2019 7  5 - 15 Final   Performed at [facility], [address] [place], [place] [address]    Assessment:  Paul Conigliaro.  is a 85 y.o. 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 Bone marrowon 09/22/2017 revealed a hypercellular for age with dyspoietic changes variably involving myeloid cell lines, but with main involvement of the granulocytic/monocytic cell line. This was  associated with bone marrow monocytosis and borderline number to slight increase in blastic cells. The overall changes favor a primary myeloid neoplasm, particularly a myelodysplastic syndrome especially refractory anemia with excess blasts (RAEB-1) or possibly refractory cytopenia with multilineage dysplasia. Consideration was also given to an evolving myelodysplastic/myeloproliferative neoplasm such as chronic myelomonocytic leukemia but the lack of absolute peripheral monocytosis precluded such a diagnosis at this time. Flow cytometrywas negative. Cytogeneticswere normal (46, XY). Foundation one was positive for ASXL1 Y782NF*62, EZH2 Splice site 130-8M>V, RUNX1 [id number]. IPSS-R4.5, intermediate group.  Anemia work-up on 08/17/2017: Vitamin B12 (658), folate (22.0), and TSH (2.688). Retic was 2.6%. Haptoglobin was 116 on 08/19/2017. Epo levelwas 104.8 on 10/05/2017.  Ferritin has been followed: 106 on 08/17/2017, 131 on 11/04/2017, 144 on 05/30/2018, 394 on 09/01/2018, 441 on 11/28/2018, 394 on 01/02/2019, and 480 on 02/06/2019. Iron saturation was 34% on 08/17/2017 and 89% on 09/01/2018.  Bone marrowon 09/22/2017 revealed a hypercellular for age with dyspoietic changes variably involving myeloid cell lines, but with main involvement of the granulocytic/monocytic cell line. This was associated with bone marrow monocytosis and borderline number to slight increase in blasts (5%). The overall changes favor a primary myeloid neoplasm, particularly a myelodysplastic syndrome especially refractory anemia with excess blasts (RAEB-1) or possibly refractory cytopenia with multilineage dysplasia. Consideration was also given to an evolving myelodysplastic/myeloproliferative neoplasm such as chronic myelomonocytic leukemia but the lack of absolute peripheral monocytosis precluded such a diagnosis at this time. Flow cytometrywas negative. Cytogeneticswere normal (46, XY). Foundation One was positive for  ASXL1 H846NG*29, EZH2 Splice site 528-4X>L, RUNX1 [id number].  He has received 14cycles ofVidaza(11/04/2017 - 08/22/2018; 10/24/2018- 02/06/2019). He has received Aranesp(initially 150 mcg every 2 weeks post chemotherapy then 300 mcg every 1-2 weeks after cycle #3; last 09/19/2018). He last received Aranesp on 03/06/2019. He has received 1-2 units of PRBCswith each cycle (last 02/21/2019).He has received 17units of PRBCs since 08/17/2017.  Bone marrowon 08/12/2018 revealed a hypercellular marrow with dyspoietic changes. There was a borderline number of blasts (5%) seen by morphology. The findings were similar to previous biopsy although the cellularity is slightly higher in the current material.consistent with previously known myelodysplastic syndrome. Flow cytometry was negative. Cytogenetics were normal (46, XY).  Symptomatically, he is doing well.  Exam is stable.  Hemoglobin is 7.3.  Plan: 1.  Labs today:CBC with diff, CMP, ferritin, hold tube.  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 Goal of therapy remains to decrease transfusion dependence and prevent acceleration of disease (blasts).  Continue Vidaza every 5 to 6 weeks as tolerated.  Begin cycle #15 Vidaza (day 1-5) today.  Continue Aranesp every 2 weeks to decrease transfusion dependence (last 03/16/2019).  PRBC transfusion on 03/15/2019. 3. Iron overload Patient has received 17 units of PRBCs from 08/17/2017- 02/21/2019. Continue to monitor ferritin and possible initiation of an oral chelating agent. 4.   RTC in 1 weeks for labs (CBC with diff, hold tube), +/- Aranesp. 5.   RTC in 3 weeks for labs (CBC with diff, hold tube), +/- Aranesp. 6.   RTC in 5 weeks for MD assessment, labs (CBC with diff, CMP, ferritin, hold tube) and cycle #16 Vidaza (day 1-5) and +/- Aranesp.   I discussed  the assessment  and treatment plan with the patient.  The patient was provided an opportunity to ask questions and all were answered.  The patient agreed with the plan and demonstrated an understanding of the instructions.  The patient was advised to call back if the symptoms worsen or if the condition fails to improve as anticipated.   Lequita Asal, MD, PhD    03/13/2019, 11:10 AM  I, Molly Dorshimer, am acting as Education administrator for [employer]. Mike Gip, MD, PhD.  I, Melissa C. Mike Gip, MD, have reviewed the above documentation for accuracy and completeness, and I agree with the above.

## 2019-03-13 ENCOUNTER — Encounter: Payer: Self-pay | Admitting: Hematology and Oncology

## 2019-03-13 ENCOUNTER — Inpatient Hospital Stay (HOSPITAL_BASED_OUTPATIENT_CLINIC_OR_DEPARTMENT_OTHER): Payer: Medicare Other | Admitting: Hematology and Oncology

## 2019-03-13 ENCOUNTER — Inpatient Hospital Stay: Payer: Medicare Other

## 2019-03-13 ENCOUNTER — Other Ambulatory Visit: Payer: Self-pay

## 2019-03-13 VITALS — BP 152/53 | HR 70 | Temp 97.7°F | Resp 16 | Wt 176.8 lb

## 2019-03-13 DIAGNOSIS — Z79899 Other long term (current) drug therapy: Secondary | ICD-10-CM | POA: Diagnosis not present

## 2019-03-13 DIAGNOSIS — Z5111 Encounter for antineoplastic chemotherapy: Secondary | ICD-10-CM | POA: Diagnosis not present

## 2019-03-13 DIAGNOSIS — Z7189 Other specified counseling: Secondary | ICD-10-CM

## 2019-03-13 DIAGNOSIS — D4621 Refractory anemia with excess of blasts 1: Secondary | ICD-10-CM | POA: Diagnosis not present

## 2019-03-13 DIAGNOSIS — D469 Myelodysplastic syndrome, unspecified: Secondary | ICD-10-CM

## 2019-03-13 DIAGNOSIS — D630 Anemia in neoplastic disease: Secondary | ICD-10-CM

## 2019-03-13 LAB — CBC WITH DIFFERENTIAL/PLATELET
Abs Immature Granulocytes: 0.09 10*3/uL — ABNORMAL HIGH (ref 0.00–0.07)
Basophils Absolute: 0 10*3/uL (ref 0.0–0.1)
Basophils Relative: 0 %
Eosinophils Absolute: 0 10*3/uL (ref 0.0–0.5)
Eosinophils Relative: 0 %
HCT: 25.8 % — ABNORMAL LOW (ref 39.0–52.0)
Hemoglobin: 7.3 g/dL — ABNORMAL LOW (ref 13.0–17.0)
Immature Granulocytes: 2 %
Lymphocytes Relative: 36 %
Lymphs Abs: 1.6 10*3/uL (ref 0.7–4.0)
MCH: 26.8 pg (ref 26.0–34.0)
MCHC: 28.3 g/dL — ABNORMAL LOW (ref 30.0–36.0)
MCV: 94.9 fL (ref 80.0–100.0)
Monocytes Absolute: 0.6 10*3/uL (ref 0.1–1.0)
Monocytes Relative: 13 %
Neutro Abs: 2.1 10*3/uL (ref 1.7–7.7)
Neutrophils Relative %: 49 %
Platelets: 255 10*3/uL (ref 150–400)
RBC: 2.72 MIL/uL — ABNORMAL LOW (ref 4.22–5.81)
RDW: 18.6 % — ABNORMAL HIGH (ref 11.5–15.5)
WBC: 4.4 10*3/uL (ref 4.0–10.5)
nRBC: 0.5 % — ABNORMAL HIGH (ref 0.0–0.2)

## 2019-03-13 LAB — COMPREHENSIVE METABOLIC PANEL
ALT: 16 U/L (ref 0–44)
AST: 20 U/L (ref 15–41)
Albumin: 4 g/dL (ref 3.5–5.0)
Alkaline Phosphatase: 26 U/L — ABNORMAL LOW (ref 38–126)
Anion gap: 7 (ref 5–15)
BUN: 18 mg/dL (ref 8–23)
CO2: 26 mmol/L (ref 22–32)
Calcium: 8.7 mg/dL — ABNORMAL LOW (ref 8.9–10.3)
Chloride: 102 mmol/L (ref 98–111)
Creatinine, Ser: 0.7 mg/dL (ref 0.61–1.24)
GFR calc Af Amer: >60 mL/min (ref >60)
GFR calc non Af Amer: >60 mL/min (ref >60)
Glucose, Bld: 130 mg/dL — ABNORMAL HIGH (ref 70–99)
Potassium: 4.4 mmol/L (ref 3.5–5.1)
Sodium: 135 mmol/L (ref 135–145)
Total Bilirubin: 0.7 mg/dL (ref 0.3–1.2)
Total Protein: 6.6 g/dL (ref 6.5–8.1)

## 2019-03-13 LAB — FERRITIN: Ferritin: 392 ng/mL — ABNORMAL HIGH (ref 24–336)

## 2019-03-13 MED ORDER — AZACITIDINE CHEMO SQ INJECTION
75.0000 mg/m2 | Freq: Once | INTRAMUSCULAR | Status: AC
  Administered 2019-03-13: 155 mg via SUBCUTANEOUS
  Filled 2019-03-13: qty 6.2

## 2019-03-13 MED ORDER — ONDANSETRON HCL 4 MG PO TABS: 8.0000 mg | ORAL_TABLET | Freq: Once | ORAL | Status: DC

## 2019-03-13 NOTE — Progress Notes (Signed)
Pt here for follow up. Denies any concerns.  

## 2019-03-13 NOTE — Patient Instructions (Signed)
Azacitidine suspension for injection (subcutaneous use) What is this medicine? AZACITIDINE (ay za SITE i deen) is a chemotherapy drug. This medicine reduces the growth of cancer cells and can suppress the immune system. It is used for treating myelodysplastic syndrome or some types of leukemia. This medicine may be used for other purposes; ask your health care provider or pharmacist if you have questions. COMMON BRAND NAME(S): Vidaza What should I tell my health care provider before I take this medicine? They need to know if you have any of these conditions:  kidney disease  liver disease  liver tumors  an unusual or allergic reaction to azacitidine, mannitol, other medicines, foods, dyes, or preservatives  pregnant or trying to get pregnant  breast-feeding How should I use this medicine? This medicine is for injection under the skin. It is administered in a hospital or clinic by a specially trained health care professional. Talk to your pediatrician regarding the use of this medicine in children. While this drug may be prescribed for selected conditions, precautions do apply. Overdosage: If you think you have taken too much of this medicine contact a poison control center or emergency room at once. NOTE: This medicine is only for you. Do not share this medicine with others. What if I miss a dose? It is important not to miss your dose. Call your doctor or health care professional if you are unable to keep an appointment. What may interact with this medicine? Interactions have not been studied. Give your health care provider a list of all the medicines, herbs, non-prescription drugs, or dietary supplements you use. Also tell them if you smoke, drink alcohol, or use illegal drugs. Some items may interact with your medicine. This list may not describe all possible interactions. Give your health care provider a list of all the medicines, herbs, non-prescription drugs, or dietary supplements  you use. Also tell them if you smoke, drink alcohol, or use illegal drugs. Some items may interact with your medicine. What should I watch for while using this medicine? Visit your doctor for checks on your progress. This drug may make you feel generally unwell. This is not uncommon, as chemotherapy can affect healthy cells as well as cancer cells. Report any side effects. Continue your course of treatment even though you feel ill unless your doctor tells you to stop. In some cases, you may be given additional medicines to help with side effects. Follow all directions for their use. Call your doctor or health care professional for advice if you get a fever, chills or sore throat, or other symptoms of a cold or flu. Do not treat yourself. This drug decreases your body's ability to fight infections. Try to avoid being around people who are sick. This medicine may increase your risk to bruise or bleed. Call your doctor or health care professional if you notice any unusual bleeding. You may need blood work done while you are taking this medicine. Do not become pregnant while taking this medicine and for 6 months after the last dose. Women should inform their doctor if they wish to become pregnant or think they might be pregnant. Men should not father a child while taking this medicine and for 3 months after the last dose. There is a potential for serious side effects to an unborn child. Talk to your health care professional or pharmacist for more information. Do not breast-feed an infant while taking this medicine and for 1 week after the last dose. This medicine may interfere with   the ability to have a child. Talk with your doctor or health care professional if you are concerned about your fertility. What side effects may I notice from receiving this medicine? Side effects that you should report to your doctor or health care professional as soon as possible:  allergic reactions like skin rash, itching or  hives, swelling of the face, lips, or tongue  low blood counts - this medicine may decrease the number of white blood cells, red blood cells and platelets. You may be at increased risk for infections and bleeding.  signs of infection - fever or chills, cough, sore throat, pain passing urine  signs of decreased platelets or bleeding - bruising, pinpoint red spots on the skin, black, tarry stools, blood in the urine  signs of decreased red blood cells - unusually weak or tired, fainting spells, lightheadedness  signs and symptoms of kidney injury like trouble passing urine or change in the amount of urine  signs and symptoms of liver injury like dark yellow or brown urine; general ill feeling or flu-like symptoms; light-colored stools; loss of appetite; nausea; right upper belly pain; unusually weak or tired; yellowing of the eyes or skin Side effects that usually do not require medical attention (report to your doctor or health care professional if they continue or are bothersome):  constipation  diarrhea  nausea, vomiting  pain or redness at the injection site  unusually weak or tired This list may not describe all possible side effects. Call your doctor for medical advice about side effects. You may report side effects to FDA at 1-800-FDA-1088. Where should I keep my medicine? This drug is given in a hospital or clinic and will not be stored at home. NOTE: This sheet is a summary. It may not cover all possible information. If you have questions about this medicine, talk to your doctor, pharmacist, or health care provider.  2020 Elsevier/Gold Standard (2016-09-15 14:37:51)  

## 2019-03-14 ENCOUNTER — Inpatient Hospital Stay: Payer: Medicare Other

## 2019-03-14 ENCOUNTER — Other Ambulatory Visit: Payer: Self-pay

## 2019-03-14 VITALS — BP 125/57 | HR 75 | Temp 97.9°F | Resp 18

## 2019-03-14 DIAGNOSIS — D469 Myelodysplastic syndrome, unspecified: Secondary | ICD-10-CM

## 2019-03-14 DIAGNOSIS — Z5111 Encounter for antineoplastic chemotherapy: Secondary | ICD-10-CM | POA: Diagnosis not present

## 2019-03-14 LAB — SAMPLE TO BLOOD BANK

## 2019-03-14 LAB — PREPARE RBC (CROSSMATCH)

## 2019-03-14 MED ORDER — ONDANSETRON HCL 4 MG PO TABS: 8.0000 mg | ORAL_TABLET | Freq: Once | ORAL | Status: DC

## 2019-03-14 MED ORDER — AZACITIDINE CHEMO SQ INJECTION
75.0000 mg/m2 | Freq: Once | INTRAMUSCULAR | Status: AC
  Administered 2019-03-14: 155 mg via SUBCUTANEOUS
  Filled 2019-03-14: qty 6.2

## 2019-03-15 ENCOUNTER — Inpatient Hospital Stay: Payer: Medicare Other

## 2019-03-15 ENCOUNTER — Other Ambulatory Visit: Payer: Self-pay

## 2019-03-15 VITALS — BP 121/59 | HR 60 | Temp 97.7°F | Resp 18

## 2019-03-15 DIAGNOSIS — D469 Myelodysplastic syndrome, unspecified: Secondary | ICD-10-CM

## 2019-03-15 DIAGNOSIS — Z5111 Encounter for antineoplastic chemotherapy: Secondary | ICD-10-CM | POA: Diagnosis not present

## 2019-03-15 MED ORDER — DIPHENHYDRAMINE HCL 25 MG PO CAPS: 25.0000 mg | ORAL_CAPSULE | Freq: Once | ORAL | Status: DC

## 2019-03-15 MED ORDER — ONDANSETRON HCL 4 MG PO TABS: 8.0000 mg | ORAL_TABLET | Freq: Once | ORAL | Status: DC

## 2019-03-15 MED ORDER — SODIUM CHLORIDE 0.9% IV SOLUTION
250.0000 mL | Freq: Once | INTRAVENOUS | Status: AC
  Administered 2019-03-15: 11:00:00 250 mL via INTRAVENOUS
  Filled 2019-03-15: qty 250

## 2019-03-15 MED ORDER — ACETAMINOPHEN 325 MG PO TABS: 650.0000 mg | ORAL_TABLET | Freq: Once | ORAL | Status: DC

## 2019-03-15 MED ORDER — AZACITIDINE CHEMO SQ INJECTION
75.0000 mg/m2 | Freq: Once | INTRAMUSCULAR | Status: AC
  Administered 2019-03-15: 155 mg via SUBCUTANEOUS
  Filled 2019-03-15: qty 6.2

## 2019-03-15 NOTE — Patient Instructions (Signed)
Blood Transfusion, Adult, Care After This sheet gives you information about how to care for yourself after your procedure. Your doctor may also give you more specific instructions. If you have problems or questions, contact your doctor. Follow these instructions at home:   Take over-the-counter and prescription medicines only as told by your doctor.  Go back to your normal activities as told by your doctor.  Follow instructions from your doctor about how to take care of the area where an IV tube was put into your vein (insertion site). Make sure you: ? Wash your hands with soap and water before you change your bandage (dressing). If there is no soap and water, use hand sanitizer. ? Change your bandage as told by your doctor.  Check your IV insertion site every day for signs of infection. Check for: ? More redness, swelling, or pain. ? More fluid or blood. ? Warmth. ? Pus or a bad smell. Contact a doctor if:  You have more redness, swelling, or pain around the IV insertion site.  You have more fluid or blood coming from the IV insertion site.  Your IV insertion site feels warm to the touch.  You have pus or a bad smell coming from the IV insertion site.  Your pee (urine) turns pink, red, or brown.  You feel weak after doing your normal activities. Get help right away if:  You have signs of a serious allergic or body defense (immune) system reaction, including: ? Itchiness. ? Hives. ? Trouble breathing. ? Anxiety. ? Pain in your chest or lower back. ? Fever, flushing, and chills. ? Fast pulse. ? Rash. ? Watery poop (diarrhea). ? Throwing up (vomiting). ? Dark pee. ? Serious headache. ? Dizziness. ? Stiff neck. ? Yellow color in your face or the white parts of your eyes (jaundice). Summary  After a blood transfusion, return to your normal activities as told by your doctor.  Every day, check for signs of infection where the IV tube was put into your vein.  Some  signs of infection are warm skin, more redness and pain, more fluid or blood, and pus or a bad smell where the needle went in.  Contact your doctor if you feel weak or have any unusual symptoms. This information is not intended to replace advice given to you by your health care provider. Make sure you discuss any questions you have with your health care provider. Document Released: 09/07/2014 Document Revised: 12/22/2017 Document Reviewed: 04/10/2016 Elsevier Patient Education  2020 Elsevier Inc.  

## 2019-03-15 NOTE — Progress Notes (Signed)
UNMATCHED BLOOD PRODUCT NOTE  Compare the patient ID on the blood tag to the patient ID on the hospital armband and Blood Bank armband. Then confirm the unit number on the blood tag matches the unit number on the blood product.  If a discrepancy is discovered return the product to blood bank immediately.   Blood Product Type: Packed Red Blood Cells  Unit #: [id number]  Product Code #: [id number]   Start Time: 11:35  Starting Rate:120 ml/hr  Rate increase/decreased  (if applicable): 370     ml/hr  Rate changed time (if applicable): 05:25   Stop Time: 9102   All Other Documentation should be documented within the Blood Admin Flowsheet per policy.

## 2019-03-16 ENCOUNTER — Inpatient Hospital Stay: Payer: Medicare Other

## 2019-03-16 VITALS — BP 158/54 | HR 77 | Temp 97.3°F | Resp 18

## 2019-03-16 DIAGNOSIS — D469 Myelodysplastic syndrome, unspecified: Secondary | ICD-10-CM

## 2019-03-16 DIAGNOSIS — Z5111 Encounter for antineoplastic chemotherapy: Secondary | ICD-10-CM | POA: Diagnosis not present

## 2019-03-16 LAB — BPAM RBC
Blood Product Expiration Date: 202008042359
Blood Product Expiration Date: 202008102359
ISSUE DATE / TIME: 202007151125
Unit Type and Rh: 1700
Unit Type and Rh: 7300

## 2019-03-16 LAB — TYPE AND SCREEN
ABO/RH(D): B POS
Antibody Screen: NEGATIVE
Unit division: 0
Unit division: 0

## 2019-03-16 MED ORDER — AZACITIDINE CHEMO SQ INJECTION
75.0000 mg/m2 | Freq: Once | INTRAMUSCULAR | Status: AC
  Administered 2019-03-16: 11:00:00 155 mg via SUBCUTANEOUS
  Filled 2019-03-16: qty 6.2

## 2019-03-16 MED ORDER — ONDANSETRON HCL 4 MG PO TABS: 8.0000 mg | ORAL_TABLET | Freq: Once | ORAL | Status: DC

## 2019-03-16 NOTE — Patient Instructions (Signed)
Azacitidine suspension for injection (subcutaneous use) What is this medicine? AZACITIDINE (ay za SITE i deen) is a chemotherapy drug. This medicine reduces the growth of cancer cells and can suppress the immune system. It is used for treating myelodysplastic syndrome or some types of leukemia. This medicine may be used for other purposes; ask your health care provider or pharmacist if you have questions. COMMON BRAND NAME(S): Vidaza What should I tell my health care provider before I take this medicine? They need to know if you have any of these conditions:  kidney disease  liver disease  liver tumors  an unusual or allergic reaction to azacitidine, mannitol, other medicines, foods, dyes, or preservatives  pregnant or trying to get pregnant  breast-feeding How should I use this medicine? This medicine is for injection under the skin. It is administered in a hospital or clinic by a specially trained health care professional. Talk to your pediatrician regarding the use of this medicine in children. While this drug may be prescribed for selected conditions, precautions do apply. Overdosage: If you think you have taken too much of this medicine contact a poison control center or emergency room at once. NOTE: This medicine is only for you. Do not share this medicine with others. What if I miss a dose? It is important not to miss your dose. Call your doctor or health care professional if you are unable to keep an appointment. What may interact with this medicine? Interactions have not been studied. Give your health care provider a list of all the medicines, herbs, non-prescription drugs, or dietary supplements you use. Also tell them if you smoke, drink alcohol, or use illegal drugs. Some items may interact with your medicine. This list may not describe all possible interactions. Give your health care provider a list of all the medicines, herbs, non-prescription drugs, or dietary supplements  you use. Also tell them if you smoke, drink alcohol, or use illegal drugs. Some items may interact with your medicine. What should I watch for while using this medicine? Visit your doctor for checks on your progress. This drug may make you feel generally unwell. This is not uncommon, as chemotherapy can affect healthy cells as well as cancer cells. Report any side effects. Continue your course of treatment even though you feel ill unless your doctor tells you to stop. In some cases, you may be given additional medicines to help with side effects. Follow all directions for their use. Call your doctor or health care professional for advice if you get a fever, chills or sore throat, or other symptoms of a cold or flu. Do not treat yourself. This drug decreases your body's ability to fight infections. Try to avoid being around people who are sick. This medicine may increase your risk to bruise or bleed. Call your doctor or health care professional if you notice any unusual bleeding. You may need blood work done while you are taking this medicine. Do not become pregnant while taking this medicine and for 6 months after the last dose. Women should inform their doctor if they wish to become pregnant or think they might be pregnant. Men should not father a child while taking this medicine and for 3 months after the last dose. There is a potential for serious side effects to an unborn child. Talk to your health care professional or pharmacist for more information. Do not breast-feed an infant while taking this medicine and for 1 week after the last dose. This medicine may interfere with   the ability to have a child. Talk with your doctor or health care professional if you are concerned about your fertility. What side effects may I notice from receiving this medicine? Side effects that you should report to your doctor or health care professional as soon as possible:  allergic reactions like skin rash, itching or  hives, swelling of the face, lips, or tongue  low blood counts - this medicine may decrease the number of white blood cells, red blood cells and platelets. You may be at increased risk for infections and bleeding.  signs of infection - fever or chills, cough, sore throat, pain passing urine  signs of decreased platelets or bleeding - bruising, pinpoint red spots on the skin, black, tarry stools, blood in the urine  signs of decreased red blood cells - unusually weak or tired, fainting spells, lightheadedness  signs and symptoms of kidney injury like trouble passing urine or change in the amount of urine  signs and symptoms of liver injury like dark yellow or brown urine; general ill feeling or flu-like symptoms; light-colored stools; loss of appetite; nausea; right upper belly pain; unusually weak or tired; yellowing of the eyes or skin Side effects that usually do not require medical attention (report to your doctor or health care professional if they continue or are bothersome):  constipation  diarrhea  nausea, vomiting  pain or redness at the injection site  unusually weak or tired This list may not describe all possible side effects. Call your doctor for medical advice about side effects. You may report side effects to FDA at 1-800-FDA-1088. Where should I keep my medicine? This drug is given in a hospital or clinic and will not be stored at home. NOTE: This sheet is a summary. It may not cover all possible information. If you have questions about this medicine, talk to your doctor, pharmacist, or health care provider.  2020 Elsevier/Gold Standard (2016-09-15 14:37:51)  

## 2019-03-17 ENCOUNTER — Other Ambulatory Visit: Payer: Self-pay

## 2019-03-17 ENCOUNTER — Inpatient Hospital Stay: Payer: Medicare Other

## 2019-03-17 VITALS — BP 122/60 | HR 64 | Temp 97.8°F | Resp 18

## 2019-03-17 DIAGNOSIS — Z5111 Encounter for antineoplastic chemotherapy: Secondary | ICD-10-CM

## 2019-03-17 DIAGNOSIS — N400 Enlarged prostate without lower urinary tract symptoms: Secondary | ICD-10-CM

## 2019-03-17 DIAGNOSIS — D469 Myelodysplastic syndrome, unspecified: Secondary | ICD-10-CM

## 2019-03-17 MED ORDER — AZACITIDINE CHEMO SQ INJECTION
75.0000 mg/m2 | Freq: Once | INTRAMUSCULAR | Status: AC
  Administered 2019-03-17: 11:00:00 155 mg via SUBCUTANEOUS
  Filled 2019-03-17: qty 6.2

## 2019-03-17 MED ORDER — ONDANSETRON HCL 4 MG PO TABS: 4.0000 mg | ORAL_TABLET | Freq: Four times a day (QID) | ORAL | 0 refills | Status: DC | PRN

## 2019-03-17 MED ORDER — ONDANSETRON HCL 4 MG PO TABS: 8.0000 mg | ORAL_TABLET | Freq: Once | ORAL | Status: DC

## 2019-03-20 ENCOUNTER — Inpatient Hospital Stay: Payer: Medicare Other

## 2019-03-20 ENCOUNTER — Other Ambulatory Visit: Payer: Self-pay

## 2019-03-20 VITALS — BP 138/65 | HR 78 | Temp 97.7°F | Resp 18

## 2019-03-20 DIAGNOSIS — D469 Myelodysplastic syndrome, unspecified: Secondary | ICD-10-CM

## 2019-03-20 DIAGNOSIS — Z5111 Encounter for antineoplastic chemotherapy: Secondary | ICD-10-CM | POA: Diagnosis not present

## 2019-03-20 LAB — CBC WITH DIFFERENTIAL/PLATELET
Abs Immature Granulocytes: 0.08 10*3/uL — ABNORMAL HIGH (ref 0.00–0.07)
Basophils Absolute: 0 10*3/uL (ref 0.0–0.1)
Basophils Relative: 0 %
Eosinophils Absolute: 0 10*3/uL (ref 0.0–0.5)
Eosinophils Relative: 0 %
HCT: 28.9 % — ABNORMAL LOW (ref 39.0–52.0)
Hemoglobin: 8.6 g/dL — ABNORMAL LOW (ref 13.0–17.0)
Immature Granulocytes: 2 %
Lymphocytes Relative: 30 %
Lymphs Abs: 1.5 10*3/uL (ref 0.7–4.0)
MCH: 27.3 pg (ref 26.0–34.0)
MCHC: 29.8 g/dL — ABNORMAL LOW (ref 30.0–36.0)
MCV: 91.7 fL (ref 80.0–100.0)
Monocytes Absolute: 0.7 10*3/uL (ref 0.1–1.0)
Monocytes Relative: 14 %
Neutro Abs: 2.7 10*3/uL (ref 1.7–7.7)
Neutrophils Relative %: 54 %
Platelets: 251 10*3/uL (ref 150–400)
RBC: 3.15 MIL/uL — ABNORMAL LOW (ref 4.22–5.81)
RDW: 16.6 % — ABNORMAL HIGH (ref 11.5–15.5)
WBC: 4.9 10*3/uL (ref 4.0–10.5)
nRBC: 0 % (ref 0.0–0.2)

## 2019-03-20 LAB — SAMPLE TO BLOOD BANK

## 2019-03-20 MED ORDER — DARBEPOETIN ALFA 300 MCG/0.6ML IJ SOSY
300.0000 ug | PREFILLED_SYRINGE | Freq: Once | INTRAMUSCULAR | Status: AC
  Administered 2019-03-20: 11:00:00 300 ug via SUBCUTANEOUS

## 2019-03-20 NOTE — Patient Instructions (Addendum)
Darbepoetin Alfa injection What is this medicine? DARBEPOETIN ALFA (dar be POE e tin AL fa) helps your body make more red blood cells. It is used to treat anemia caused by chronic kidney failure and chemotherapy. This medicine may be used for other purposes; ask your health care provider or pharmacist if you have questions. COMMON BRAND NAME(S): Aranesp What should I tell my health care provider before I take this medicine? They need to know if you have any of these conditions:  blood clotting disorders or history of blood clots  cancer patient not on chemotherapy  cystic fibrosis  heart disease, such as angina, heart failure, or a history of a heart attack  hemoglobin level of 12 g/dL or greater  high blood pressure  low levels of folate, iron, or vitamin B12  seizures  an unusual or allergic reaction to darbepoetin, erythropoietin, albumin, hamster proteins, latex, other medicines, foods, dyes, or preservatives  pregnant or trying to get pregnant  breast-feeding How should I use this medicine? This medicine is for injection into a vein or under the skin. It is usually given by a health care professional in a hospital or clinic setting. If you get this medicine at home, you will be taught how to prepare and give this medicine. Use exactly as directed. Take your medicine at regular intervals. Do not take your medicine more often than directed. It is important that you put your used needles and syringes in a special sharps container. Do not put them in a trash can. If you do not have a sharps container, call your pharmacist or healthcare provider to get one. A special MedGuide will be given to you by the pharmacist with each prescription and refill. Be sure to read this information carefully each time. Talk to your pediatrician regarding the use of this medicine in children. While this medicine may be used in children as young as 1 month of age for selected conditions, precautions do  apply. Overdosage: If you think you have taken too much of this medicine contact a poison control center or emergency room at once. NOTE: This medicine is only for you. Do not share this medicine with others. What if I miss a dose? If you miss a dose, take it as soon as you can. If it is almost time for your next dose, take only that dose. Do not take double or extra doses. What may interact with this medicine? Do not take this medicine with any of the following medications:  epoetin alfa This list may not describe all possible interactions. Give your health care provider a list of all the medicines, herbs, non-prescription drugs, or dietary supplements you use. Also tell them if you smoke, drink alcohol, or use illegal drugs. Some items may interact with your medicine. What should I watch for while using this medicine? Your condition will be monitored carefully while you are receiving this medicine. You may need blood work done while you are taking this medicine. This medicine may cause a decrease in vitamin B6. You should make sure that you get enough vitamin B6 while you are taking this medicine. Discuss the foods you eat and the vitamins you take with your health care professional. What side effects may I notice from receiving this medicine? Side effects that you should report to your doctor or health care professional as soon as possible:  allergic reactions like skin rash, itching or hives, swelling of the face, lips, or tongue  breathing problems  changes in   vision  chest pain  confusion, trouble speaking or understanding  feeling faint or lightheaded, falls  high blood pressure  muscle aches or pains  pain, swelling, warmth in the leg  rapid weight gain  severe headaches  sudden numbness or weakness of the face, arm or leg  trouble walking, dizziness, loss of balance or coordination  seizures (convulsions)  swelling of the ankles, feet, hands  unusually weak or  tired Side effects that usually do not require medical attention (report to your doctor or health care professional if they continue or are bothersome):  diarrhea  fever, chills (flu-like symptoms)  headaches  nausea, vomiting  redness, stinging, or swelling at site where injected This list may not describe all possible side effects. Call your doctor for medical advice about side effects. You may report side effects to FDA at 1-800-FDA-1088. Where should I keep my medicine? Keep out of the reach of children. Store in a refrigerator between 2 and 8 degrees C (36 and 46 degrees F). Do not freeze. Do not shake. Throw away any unused portion if using a single-dose vial. Throw away any unused medicine after the expiration date. NOTE: This sheet is a summary. It may not cover all possible information. If you have questions about this medicine, talk to your doctor, pharmacist, or health care provider.  2020 Elsevier/Gold Standard (2017-09-01 16:44:20)  

## 2019-03-31 ENCOUNTER — Other Ambulatory Visit: Payer: Self-pay

## 2019-04-03 ENCOUNTER — Other Ambulatory Visit: Payer: Self-pay | Admitting: *Deleted

## 2019-04-03 ENCOUNTER — Inpatient Hospital Stay: Payer: Medicare Other

## 2019-04-03 ENCOUNTER — Inpatient Hospital Stay: Payer: Medicare Other | Attending: Hematology and Oncology

## 2019-04-03 ENCOUNTER — Other Ambulatory Visit: Payer: Self-pay | Admitting: Hematology and Oncology

## 2019-04-03 ENCOUNTER — Other Ambulatory Visit: Payer: Self-pay

## 2019-04-03 VITALS — BP 124/65 | HR 80 | Temp 98.5°F | Resp 16

## 2019-04-03 DIAGNOSIS — Z5111 Encounter for antineoplastic chemotherapy: Secondary | ICD-10-CM | POA: Diagnosis not present

## 2019-04-03 DIAGNOSIS — D649 Anemia, unspecified: Secondary | ICD-10-CM

## 2019-04-03 DIAGNOSIS — D469 Myelodysplastic syndrome, unspecified: Secondary | ICD-10-CM

## 2019-04-03 DIAGNOSIS — D4621 Refractory anemia with excess of blasts 1: Secondary | ICD-10-CM | POA: Diagnosis present

## 2019-04-03 LAB — CBC WITH DIFFERENTIAL/PLATELET
Abs Immature Granulocytes: 0.06 10*3/uL (ref 0.00–0.07)
Basophils Absolute: 0 10*3/uL (ref 0.0–0.1)
Basophils Relative: 0 %
Eosinophils Absolute: 0 10*3/uL (ref 0.0–0.5)
Eosinophils Relative: 0 %
HCT: 24.1 % — ABNORMAL LOW (ref 39.0–52.0)
Hemoglobin: 7.2 g/dL — ABNORMAL LOW (ref 13.0–17.0)
Immature Granulocytes: 2 %
Lymphocytes Relative: 33 %
Lymphs Abs: 1.1 10*3/uL (ref 0.7–4.0)
MCH: 27.7 pg (ref 26.0–34.0)
MCHC: 29.9 g/dL — ABNORMAL LOW (ref 30.0–36.0)
MCV: 92.7 fL (ref 80.0–100.0)
Monocytes Absolute: 0.4 10*3/uL (ref 0.1–1.0)
Monocytes Relative: 13 %
Neutro Abs: 1.7 10*3/uL (ref 1.7–7.7)
Neutrophils Relative %: 52 %
Platelets: 139 10*3/uL — ABNORMAL LOW (ref 150–400)
RBC: 2.6 MIL/uL — ABNORMAL LOW (ref 4.22–5.81)
RDW: 17.9 % — ABNORMAL HIGH (ref 11.5–15.5)
WBC: 3.3 10*3/uL — ABNORMAL LOW (ref 4.0–10.5)
nRBC: 0.9 % — ABNORMAL HIGH (ref 0.0–0.2)

## 2019-04-03 LAB — SAMPLE TO BLOOD BANK

## 2019-04-03 MED ORDER — DARBEPOETIN ALFA 300 MCG/0.6ML IJ SOSY
300.0000 ug | PREFILLED_SYRINGE | Freq: Once | INTRAMUSCULAR | Status: AC
  Administered 2019-04-03: 300 ug via SUBCUTANEOUS

## 2019-04-03 NOTE — Patient Instructions (Signed)
Darbepoetin Alfa injection What is this medicine? DARBEPOETIN ALFA (dar be POE e tin AL fa) helps your body make more red blood cells. It is used to treat anemia caused by chronic kidney failure and chemotherapy. This medicine may be used for other purposes; ask your health care provider or pharmacist if you have questions. COMMON BRAND NAME(S): Aranesp What should I tell my health care provider before I take this medicine? They need to know if you have any of these conditions:  blood clotting disorders or history of blood clots  cancer patient not on chemotherapy  cystic fibrosis  heart disease, such as angina, heart failure, or a history of a heart attack  hemoglobin level of 12 g/dL or greater  high blood pressure  low levels of folate, iron, or vitamin B12  seizures  an unusual or allergic reaction to darbepoetin, erythropoietin, albumin, hamster proteins, latex, other medicines, foods, dyes, or preservatives  pregnant or trying to get pregnant  breast-feeding How should I use this medicine? This medicine is for injection into a vein or under the skin. It is usually given by a health care professional in a hospital or clinic setting. If you get this medicine at home, you will be taught how to prepare and give this medicine. Use exactly as directed. Take your medicine at regular intervals. Do not take your medicine more often than directed. It is important that you put your used needles and syringes in a special sharps container. Do not put them in a trash can. If you do not have a sharps container, call your pharmacist or healthcare provider to get one. A special MedGuide will be given to you by the pharmacist with each prescription and refill. Be sure to read this information carefully each time. Talk to your pediatrician regarding the use of this medicine in children. While this medicine may be used in children as young as 1 month of age for selected conditions, precautions do  apply. Overdosage: If you think you have taken too much of this medicine contact a poison control center or emergency room at once. NOTE: This medicine is only for you. Do not share this medicine with others. What if I miss a dose? If you miss a dose, take it as soon as you can. If it is almost time for your next dose, take only that dose. Do not take double or extra doses. What may interact with this medicine? Do not take this medicine with any of the following medications:  epoetin alfa This list may not describe all possible interactions. Give your health care provider a list of all the medicines, herbs, non-prescription drugs, or dietary supplements you use. Also tell them if you smoke, drink alcohol, or use illegal drugs. Some items may interact with your medicine. What should I watch for while using this medicine? Your condition will be monitored carefully while you are receiving this medicine. You may need blood work done while you are taking this medicine. This medicine may cause a decrease in vitamin B6. You should make sure that you get enough vitamin B6 while you are taking this medicine. Discuss the foods you eat and the vitamins you take with your health care professional. What side effects may I notice from receiving this medicine? Side effects that you should report to your doctor or health care professional as soon as possible:  allergic reactions like skin rash, itching or hives, swelling of the face, lips, or tongue  breathing problems  changes in   vision  chest pain  confusion, trouble speaking or understanding  feeling faint or lightheaded, falls  high blood pressure  muscle aches or pains  pain, swelling, warmth in the leg  rapid weight gain  severe headaches  sudden numbness or weakness of the face, arm or leg  trouble walking, dizziness, loss of balance or coordination  seizures (convulsions)  swelling of the ankles, feet, hands  unusually weak or  tired Side effects that usually do not require medical attention (report to your doctor or health care professional if they continue or are bothersome):  diarrhea  fever, chills (flu-like symptoms)  headaches  nausea, vomiting  redness, stinging, or swelling at site where injected This list may not describe all possible side effects. Call your doctor for medical advice about side effects. You may report side effects to FDA at 1-800-FDA-1088. Where should I keep my medicine? Keep out of the reach of children. Store in a refrigerator between 2 and 8 degrees C (36 and 46 degrees F). Do not freeze. Do not shake. Throw away any unused portion if using a single-dose vial. Throw away any unused medicine after the expiration date. NOTE: This sheet is a summary. It may not cover all possible information. If you have questions about this medicine, talk to your doctor, pharmacist, or health care provider.  2020 [employer] Standard (2017-09-01 16:44:20)  

## 2019-04-04 ENCOUNTER — Other Ambulatory Visit: Payer: Self-pay

## 2019-04-05 ENCOUNTER — Inpatient Hospital Stay: Payer: Medicare Other

## 2019-04-05 DIAGNOSIS — D649 Anemia, unspecified: Secondary | ICD-10-CM

## 2019-04-05 DIAGNOSIS — Z5111 Encounter for antineoplastic chemotherapy: Secondary | ICD-10-CM | POA: Diagnosis not present

## 2019-04-05 LAB — PREPARE RBC (CROSSMATCH)

## 2019-04-05 MED ORDER — SODIUM CHLORIDE 0.9% IV SOLUTION
250.0000 mL | Freq: Once | INTRAVENOUS | Status: AC
  Administered 2019-04-05: 11:00:00 250 mL via INTRAVENOUS
  Filled 2019-04-05: qty 250

## 2019-04-05 MED ORDER — DIPHENHYDRAMINE HCL 25 MG PO CAPS: 25.0000 mg | ORAL_CAPSULE | Freq: Once | ORAL | Status: DC

## 2019-04-05 MED ORDER — ACETAMINOPHEN 325 MG PO TABS: 650.0000 mg | ORAL_TABLET | Freq: Once | ORAL | Status: DC

## 2019-04-05 NOTE — Patient Instructions (Signed)
Blood Transfusion, Adult, Care After This sheet gives you information about how to care for yourself after your procedure. Your doctor may also give you more specific instructions. If you have problems or questions, contact your doctor. Follow these instructions at home:   Take over-the-counter and prescription medicines only as told by your doctor.  Go back to your normal activities as told by your doctor.  Follow instructions from your doctor about how to take care of the area where an IV tube was put into your vein (insertion site). Make sure you: ? Wash your hands with soap and water before you change your bandage (dressing). If there is no soap and water, use hand sanitizer. ? Change your bandage as told by your doctor.  Check your IV insertion site every day for signs of infection. Check for: ? More redness, swelling, or pain. ? More fluid or blood. ? Warmth. ? Pus or a bad smell. Contact a doctor if:  You have more redness, swelling, or pain around the IV insertion site.  You have more fluid or blood coming from the IV insertion site.  Your IV insertion site feels warm to the touch.  You have pus or a bad smell coming from the IV insertion site.  Your pee (urine) turns pink, red, or brown.  You feel weak after doing your normal activities. Get help right away if:  You have signs of a serious allergic or body defense (immune) system reaction, including: ? Itchiness. ? Hives. ? Trouble breathing. ? Anxiety. ? Pain in your chest or lower back. ? Fever, flushing, and chills. ? Fast pulse. ? Rash. ? Watery poop (diarrhea). ? Throwing up (vomiting). ? Dark pee. ? Serious headache. ? Dizziness. ? Stiff neck. ? Yellow color in your face or the white parts of your eyes (jaundice). Summary  After a blood transfusion, return to your normal activities as told by your doctor.  Every day, check for signs of infection where the IV tube was put into your vein.  Some  signs of infection are warm skin, more redness and pain, more fluid or blood, and pus or a bad smell where the needle went in.  Contact your doctor if you feel weak or have any unusual symptoms. This information is not intended to replace advice given to you by your health care provider. Make sure you discuss any questions you have with your health care provider. Document Released: 09/07/2014 Document Revised: 12/22/2017 Document Reviewed: 04/10/2016 Elsevier Patient Education  2020 Elsevier Inc.  

## 2019-04-06 LAB — TYPE AND SCREEN
ABO/RH(D): B POS
Antibody Screen: NEGATIVE
Unit division: 0

## 2019-04-06 LAB — BPAM RBC
Blood Product Expiration Date: 202008162359
ISSUE DATE / TIME: 202008051105
Unit Type and Rh: 7300

## 2019-04-13 ENCOUNTER — Other Ambulatory Visit: Payer: Self-pay

## 2019-04-13 DIAGNOSIS — D630 Anemia in neoplastic disease: Secondary | ICD-10-CM

## 2019-04-14 ENCOUNTER — Other Ambulatory Visit: Payer: Self-pay

## 2019-04-14 NOTE — Progress Notes (Signed)
[facility]  [address], [address] [place], [place] [address] Phone: [phone]  Fax: [phone]   Clinic Day:  04/17/2019  Referring physician: Tracie Harrier, MD  Chief Complaint: Paul Hidrogo. is a 85 y.o. 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  HPI: The patient was last seen in the medical oncology clinic on 03/13/2019. At that time, he was doing well.  Exam was stable. Hemoglobin was 7.3. He began cycle #15 Vidaza.   He received Aranesp on 03/20/2019 and 04/03/2019. He received 1 unit of PRBCs on 03/15/2019 and 04/05/2019.   Labs followed: 03/20/2019: hematocrit 28.9, hemoglobin 8.6, MCV 91.7, platelets 251,000, WBC 4,900.  04/03/2019: hematocrit 24.1, hemoglobin 7.2, MCV 92.7, platelets 139,000, WBC 3,300.   During the interim, he is feeling "great, about the same or better." He denies any fevers or infections. He denies any concerns.    Past Medical History:  Diagnosis Date  . Anemia   . Arthritis   . BPH (benign prostatic hypertrophy)   . Complication of anesthesia    nausea  . Diabetes type 2, controlled ([place])   . HOH (hard of hearing)    r and L ears-70% loss per pt  . Hyperlipidemia   . Hypertension     Past Surgical History:  Procedure Laterality Date  . APPENDECTOMY    . COLONOSCOPY  09/21/08   1 polyp found, tubular adenoma  . HAND SURGERY    . KNEE SURGERY Left     Family History  Problem Relation Age of Onset  . Aneurysm Mother   . Heart disease Father   . Cancer Brother        skin  . Heart disease Brother   . Heart disease Brother   . Cancer Sister        skin  . Aneurysm Brother   . Heart disease Brother   . Heart disease Sister   . Heart disease Sister   . COPD Sister   . Diabetes Sister     Social History:  reports that he has never smoked. He has never used smokeless tobacco. He reports that he does not drink alcohol or use drugs. Patient is a retired Arboriculturist. Patient denies known exposures to radiation on toxins. 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 He had his 32-year marriage anniversary on 08/23/2018.He is walking 1-2 miles per day.The patient isalonetoday.  Allergies: No Known Allergies  Current Medications: Current Outpatient Medications  Medication Sig Dispense Refill  . aspirin 81 MG chewable tablet Chew 81 mg by mouth daily.    . Cranberry (THERACRAN PO) Take 1 tablet by mouth daily.     . cyanocobalamin 1000 MCG tablet Take 1,000 mcg by mouth daily.    . finasteride (PROSCAR) 5 MG tablet Take 5 mg by mouth daily.    Marland Kitchen glimepiride (AMARYL) 1 MG tablet Take 1 tablet by mouth daily after breakfast.    . glucose blood test strip FreeStyle Lite Strips    . LANCETS ULTRA FINE MISC Lancets,Ultra Thin 26 gauge    . lisinopril (PRINIVIL,ZESTRIL) 10 MG tablet Take 10 mg by mouth daily.    . meclizine (ANTIVERT) 25 MG tablet Take 25 mg by mouth 2 (two) times daily as needed.     . metformin (FORTAMET) 1000 MG (OSM) 24 hr tablet Take 1,000 mg by mouth 2 (two) times daily with a meal.    .  Multiple Vitamins-Minerals (CENTRUM SILVER PO) Take by mouth.    . Omega-3 Fatty Acids (FISH OIL PO) Take 1 tablet by mouth daily.     [name] (PROSTATE SR PO) Take 1 tablet by mouth daily.     . simvastatin (ZOCOR) 40 MG tablet Take 40 mg by mouth daily.    Marland Kitchen loperamide (IMODIUM) 2 MG capsule Take 1 capsule (2 mg total) by mouth See admin instructions. With onset of loose stool, take 4mg  followed by 2mg  every 2 hours until loose bowel movement stopped. Maximum: 16 mg/day (Patient not taking: Reported on 11/28/2018) 30 capsule 0  . ondansetron (ZOFRAN) 4 MG tablet Take 1 tablet (4 mg total) by mouth every 6 (six) hours as needed for nausea or vomiting. (Patient not taking: Reported on 04/17/2019) 30 tablet 0  . Polyethylene Glycol 3350 (MIRALAX PO) Take by mouth as needed.      No current facility-administered  medications for this visit.     Review of Systems  Constitutional: Negative.  Negative for chills, diaphoresis, fever, malaise/fatigue and weight loss (up 2 lbs).       Feeling "great, about the same or better."  HENT: Positive for hearing loss. Negative for congestion, ear pain, nosebleeds, sinus pain and sore throat.   Eyes: Negative.  Negative for blurred vision, double vision, photophobia and pain.  Respiratory: Negative.  Negative for cough, sputum production, shortness of breath and wheezing.   Cardiovascular: Negative.  Negative for chest pain, palpitations, orthopnea, leg swelling and PND.  Gastrointestinal: Negative.  Negative for abdominal pain, blood in stool, constipation, diarrhea, melena, nausea and vomiting.  Genitourinary: Negative.  Negative for dysuria, frequency, hematuria and urgency.  Musculoskeletal: Negative.  Negative for back pain, myalgias and neck pain.  Skin: Negative.  Negative for itching and rash.  Neurological: Negative.  Negative for dizziness, tingling, sensory change, focal weakness, weakness and headaches.  Endo/Heme/Allergies: Does not bruise/bleed easily.       Diabetes.  Psychiatric/Behavioral: Negative.  Negative for depression, memory loss and substance abuse. The patient is not nervous/anxious and does not have insomnia.   All other systems reviewed and are negative.  Performance status (ECOG): 0  Vitals Blood pressure 134/65, pulse 73, temperature 97.9 F (36.6 C), temperature source Tympanic, resp. rate 18, height 6\' 1"  (1.854 m), weight 178 lb 5.6 oz (80.9 kg), SpO2 100 %.   Physical Exam  Constitutional: He is oriented to person, place, and time. He appears well-developed and well-nourished. No distress.  HENT:  Head: Normocephalic and atraumatic.  Mouth/Throat: Oropharynx is clear and moist. No oropharyngeal exudate.  Hearing aid.  Sparse short gray hair.  Near alopecia/male pattern baldness. Mask.  Eyes: Pupils are equal, round, and  reactive to light. Conjunctivae and EOM are normal. No scleral icterus.  Gold rimmed glasses.  Blue eyes.  Neck: Normal range of motion. Neck supple. No JVD present.  Cardiovascular: Normal rate, regular rhythm and normal heart sounds. Exam reveals no gallop and no friction rub.  No murmur heard. Pulmonary/Chest: Effort normal and breath sounds normal. No respiratory distress. He has no wheezes. He has no rales.  Abdominal: Soft. Bowel sounds are normal. He exhibits no distension and no mass. There is no abdominal tenderness. There is no rebound and no guarding.  Musculoskeletal: Normal range of motion.        General: No tenderness or edema.  Lymphadenopathy:    He has no cervical adenopathy.    He has no axillary adenopathy.  Right: No supraclavicular adenopathy present.       Left: No supraclavicular adenopathy present.  Neurological: He is alert and oriented to person, place, and time. He has normal reflexes.  Skin: Skin is warm and dry. No rash noted. He is not diaphoretic. No erythema. No pallor.  Psychiatric: He has a normal mood and affect. His behavior is normal. Judgment and thought content normal.  Nursing note and vitals reviewed.   Appointment on 04/17/2019  Component Date Value Ref Range Status  . Sodium 04/17/2019 137  135 - 145 mmol/L Final  . Potassium 04/17/2019 4.3  3.5 - 5.1 mmol/L Final  . Chloride 04/17/2019 105  98 - 111 mmol/L Final  . CO2 04/17/2019 26  22 - 32 mmol/L Final  . Glucose, Bld 04/17/2019 145* 70 - 99 mg/dL Final  . BUN 04/17/2019 22  8 - 23 mg/dL Final  . Creatinine, Ser 04/17/2019 0.60* 0.61 - 1.24 mg/dL Final  . Calcium 04/17/2019 8.8* 8.9 - 10.3 mg/dL Final  . Total Protein 04/17/2019 7.0  6.5 - 8.1 g/dL Final  . Albumin 04/17/2019 4.1  3.5 - 5.0 g/dL Final  . AST 04/17/2019 19  15 - 41 U/L Final  . ALT 04/17/2019 16  0 - 44 U/L Final  . Alkaline Phosphatase 04/17/2019 29* 38 - 126 U/L Final  . Total Bilirubin 04/17/2019 0.9  0.3 - 1.2  mg/dL Final  . GFR calc non Af Amer 04/17/2019 >60  >60 mL/min Final  . GFR calc Af Amer 04/17/2019 >60  >60 mL/min Final  . Anion gap 04/17/2019 6  5 - 15 Final   Performed at [facility], [address], [place], [place] [address]  . WBC 04/17/2019 3.3* 4.0 - 10.5 K/uL Final  . RBC 04/17/2019 3.00* 4.22 - 5.81 MIL/uL Final  . Hemoglobin 04/17/2019 8.1* 13.0 - 17.0 g/dL Final  . HCT 04/17/2019 27.4* 39.0 - 52.0 % Final  . MCV 04/17/2019 91.3  80.0 - 100.0 fL Final  . MCH 04/17/2019 27.0  26.0 - 34.0 pg Final  . MCHC 04/17/2019 29.6* 30.0 - 36.0 g/dL Final  . RDW 04/17/2019 17.0* 11.5 - 15.5 % Final  . Platelets 04/17/2019 248  150 - 400 K/uL Final  . nRBC 04/17/2019 0.6* 0.0 - 0.2 % Final  . Neutrophils Relative % 04/17/2019 41  % Final  . Neutro Abs 04/17/2019 1.4* 1.7 - 7.7 K/uL Final  . Lymphocytes Relative 04/17/2019 39  % Final  . Lymphs Abs 04/17/2019 1.3  0.7 - 4.0 K/uL Final  . Monocytes Relative 04/17/2019 18  % Final  . Monocytes Absolute 04/17/2019 0.6  0.1 - 1.0 K/uL Final  . Eosinophils Relative 04/17/2019 0  % Final  . Eosinophils Absolute 04/17/2019 0.0  0.0 - 0.5 K/uL Final  . Basophils Relative 04/17/2019 0  % Final  . Basophils Absolute 04/17/2019 0.0  0.0 - 0.1 K/uL Final  . Immature Granulocytes 04/17/2019 2  % Final  . Abs Immature Granulocytes 04/17/2019 0.05  0.00 - 0.07 K/uL Final   Performed at [facility], [address], [place], [place] [address]    Assessment:  Paul Pham. is a 85 y.o. 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 Bone marrowon 09/22/2017 revealed a hypercellular for age with dyspoietic changes variably involving myeloid cell lines, but with main involvement of the granulocytic/monocytic cell line. This was associated with bone marrow monocytosis and borderline number to slight increase in blastic cells. The overall changes favor a primary  myeloid neoplasm, particularly a myelodysplastic syndrome especially refractory anemia with  excess blasts (RAEB-1) or possibly refractory cytopenia with multilineage dysplasia. Consideration was also given to an evolving myelodysplastic/myeloproliferative neoplasm such as chronic myelomonocytic leukemia but the lack of absolute peripheral monocytosis precluded such a diagnosis at this time. Flow cytometrywas negative. Cytogeneticswere normal (46, XY). Foundation one was positive for ASXL1 [id number]*62, EZH2 Splice site 229-7L>G, RUNX1 [id number]. IPSS-R4.5, intermediate group.  Anemia work-up on 08/17/2017: Vitamin B12 (658), folate (22.0), and TSH (2.688). Retic was 2.6%. Haptoglobin was 116 on 08/19/2017. Epo levelwas 104.8 on 10/05/2017.  Ferritin has been followed: 106 on 08/17/2017, 131 on 11/04/2017, 144 on 05/30/2018, 394 on 09/01/2018, 441 on 11/28/2018, 394 on 01/02/2019, 480 on 02/06/2019, 392 on 03/13/2019, and 527 on 04/17/2019. Iron saturation was 34% on 08/17/2017 and 89% on 09/01/2018.  Bone marrowon 09/22/2017 revealed a hypercellular for age with dyspoietic changes variably involving myeloid cell lines, but with main involvement of the granulocytic/monocytic cell line. This was associated with bone marrow monocytosis and borderline number to slight increase in blasts (5%). The overall changes favor a primary myeloid neoplasm, particularly a myelodysplastic syndrome especially refractory anemia with excess blasts (RAEB-1) or possibly refractory cytopenia with multilineage dysplasia. Consideration was also given to an evolving myelodysplastic/myeloproliferative neoplasm such as chronic myelomonocytic leukemia but the lack of absolute peripheral monocytosis precluded such a diagnosis at this time. Flow cytometrywas negative. Cytogeneticswere normal (46, XY). Foundation One was positive for ASXL1 X211HE*17, EZH2 Splice site 408-1K>G, RUNX1 [id number].  He has received 14cycles ofVidaza(11/04/2017 - 08/22/2018; 10/24/2018- 02/06/2019). He has received Aranesp(initially  150 mcg every 2 weeks post chemotherapy then 300 mcg every 1-2 weeks after cycle #3; last 09/19/2018). He last received Aranesp on 04/03/2019. He has received 1-2 units of PRBCswith each cycle (last 04/05/2019).He has received18units of PRBCs since 08/17/2017.  Bone marrowon 08/12/2018 revealed a hypercellular marrow with dyspoietic changes. There was a borderline number of blasts (5%) seen by morphology. The findings were similar to previous biopsy although the cellularity is slightly higher in the current material.consistent with previously known myelodysplastic syndrome. Flow cytometry was negative. Cytogenetics were normal (46, XY).  Symptomatically, he feels great.  Exam is stable.  WBC and ANC are lower than baseline.    Plan: 1.   Labs today:CBC with differential, CMP, ferritin, hold tube.   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    He denies any side effects associated with treatment.   Goal of therapy remains to decrease transfusion dependence and prevent acceleration of disease (blasts).             Review counts today.    Typically WBC has ranged between 4400-5600 with an Johnson of 2100-3200 on day 1 of treatment.   Counts today include a WBC of 3300 with an ANC of 1400  Discuss postponing Vidaza x1 week with subsequent cycles every 6 weeks as tolerated.             Aranesp today and every 2 weeks to decrease transfusion dependence (last 04/03/2019). 3. Iron overload Patient has received 18 units of PRBCs from 08/17/2017 - 04/05/2019. Continue to monitor ferritin and possible initiation of an oral chelating agent. 4.   RTC in 1 week for MD assessment, labs (CBC with diff, CMP, hold tube), and day 1-5 Vidaza.  I discussed the assessment and treatment plan with the patient.  The patient was provided an opportunity to ask questions and all were answered.  The  patient agreed  with the plan and demonstrated an understanding of the instructions.  The patient was advised to call back if the symptoms worsen or if the condition fails to improve as anticipated.    Lequita Asal, MD, PhD    04/17/2019, 10:12 AM  I, Molly Dorshimer, am acting as Education administrator for [employer]. Mike Gip, MD, PhD.  I, Melissa C. Mike Gip, MD, have reviewed the above documentation for accuracy and completeness, and I agree with the above.

## 2019-04-17 ENCOUNTER — Other Ambulatory Visit: Payer: Self-pay

## 2019-04-17 ENCOUNTER — Encounter: Payer: Self-pay | Admitting: Hematology and Oncology

## 2019-04-17 ENCOUNTER — Inpatient Hospital Stay (HOSPITAL_BASED_OUTPATIENT_CLINIC_OR_DEPARTMENT_OTHER): Payer: Medicare Other | Admitting: Hematology and Oncology

## 2019-04-17 ENCOUNTER — Inpatient Hospital Stay: Payer: Medicare Other

## 2019-04-17 VITALS — BP 134/65 | HR 73 | Temp 97.9°F | Resp 18 | Ht 73.0 in | Wt 178.4 lb

## 2019-04-17 DIAGNOSIS — D630 Anemia in neoplastic disease: Secondary | ICD-10-CM

## 2019-04-17 DIAGNOSIS — D469 Myelodysplastic syndrome, unspecified: Secondary | ICD-10-CM

## 2019-04-17 DIAGNOSIS — Z5111 Encounter for antineoplastic chemotherapy: Secondary | ICD-10-CM | POA: Diagnosis not present

## 2019-04-17 LAB — COMPREHENSIVE METABOLIC PANEL
ALT: 16 U/L (ref 0–44)
AST: 19 U/L (ref 15–41)
Albumin: 4.1 g/dL (ref 3.5–5.0)
Alkaline Phosphatase: 29 U/L — ABNORMAL LOW (ref 38–126)
Anion gap: 6 (ref 5–15)
BUN: 22 mg/dL (ref 8–23)
CO2: 26 mmol/L (ref 22–32)
Calcium: 8.8 mg/dL — ABNORMAL LOW (ref 8.9–10.3)
Chloride: 105 mmol/L (ref 98–111)
Creatinine, Ser: 0.6 mg/dL — ABNORMAL LOW (ref 0.61–1.24)
GFR calc Af Amer: >60 mL/min (ref >60)
GFR calc non Af Amer: >60 mL/min (ref >60)
Glucose, Bld: 145 mg/dL — ABNORMAL HIGH (ref 70–99)
Potassium: 4.3 mmol/L (ref 3.5–5.1)
Sodium: 137 mmol/L (ref 135–145)
Total Bilirubin: 0.9 mg/dL (ref 0.3–1.2)
Total Protein: 7 g/dL (ref 6.5–8.1)

## 2019-04-17 LAB — CBC WITH DIFFERENTIAL/PLATELET
Abs Immature Granulocytes: 0.05 10*3/uL (ref 0.00–0.07)
Basophils Absolute: 0 10*3/uL (ref 0.0–0.1)
Basophils Relative: 0 %
Eosinophils Absolute: 0 10*3/uL (ref 0.0–0.5)
Eosinophils Relative: 0 %
HCT: 27.4 % — ABNORMAL LOW (ref 39.0–52.0)
Hemoglobin: 8.1 g/dL — ABNORMAL LOW (ref 13.0–17.0)
Immature Granulocytes: 2 %
Lymphocytes Relative: 39 %
Lymphs Abs: 1.3 10*3/uL (ref 0.7–4.0)
MCH: 27 pg (ref 26.0–34.0)
MCHC: 29.6 g/dL — ABNORMAL LOW (ref 30.0–36.0)
MCV: 91.3 fL (ref 80.0–100.0)
Monocytes Absolute: 0.6 10*3/uL (ref 0.1–1.0)
Monocytes Relative: 18 %
Neutro Abs: 1.4 10*3/uL — ABNORMAL LOW (ref 1.7–7.7)
Neutrophils Relative %: 41 %
Platelets: 248 10*3/uL (ref 150–400)
RBC: 3 MIL/uL — ABNORMAL LOW (ref 4.22–5.81)
RDW: 17 % — ABNORMAL HIGH (ref 11.5–15.5)
WBC: 3.3 10*3/uL — ABNORMAL LOW (ref 4.0–10.5)
nRBC: 0.6 % — ABNORMAL HIGH (ref 0.0–0.2)

## 2019-04-17 LAB — FERRITIN: Ferritin: 527 ng/mL — ABNORMAL HIGH (ref 24–336)

## 2019-04-17 LAB — SAMPLE TO BLOOD BANK

## 2019-04-17 MED ORDER — DARBEPOETIN ALFA 300 MCG/0.6ML IJ SOSY
PREFILLED_SYRINGE | INTRAMUSCULAR | Status: AC
  Filled 2019-04-17: qty 0.6

## 2019-04-17 MED ORDER — DARBEPOETIN ALFA 300 MCG/0.6ML IJ SOSY
300.0000 ug | PREFILLED_SYRINGE | Freq: Once | INTRAMUSCULAR | Status: AC
  Administered 2019-04-17: 300 ug via SUBCUTANEOUS

## 2019-04-17 NOTE — Progress Notes (Signed)
No new changes noted today 

## 2019-04-18 ENCOUNTER — Inpatient Hospital Stay: Payer: Medicare Other

## 2019-04-19 ENCOUNTER — Inpatient Hospital Stay: Payer: Medicare Other

## 2019-04-20 ENCOUNTER — Inpatient Hospital Stay: Payer: Medicare Other

## 2019-04-21 ENCOUNTER — Inpatient Hospital Stay: Payer: Medicare Other

## 2019-04-23 NOTE — Progress Notes (Signed)
Confirmed Name, DOB, and Address. Assessment completed with assistance of patient's wife, Inez Catalina. Denies any concerns.

## 2019-04-23 NOTE — Progress Notes (Signed)
[facility]  [address] [place], [place] [address] Phone: [phone]  Fax: [phone]   Clinic Day:  04/24/2019  Referring physician: Tracie Harrier, MD  Chief Complaint: Paul Sattler. is a 85 y.o. 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  HPI: The patient was last seen in the hematology clinic on 04/17/2019. At that time, he felt great.  Exam was stable.  WBC and ANC were lower than baseline.  He received Aranesp. Decision was made to postpone treatment x 1 week.  During the interim, he has done well.  He denies any complaints.  He states that he is eating "good".  Diabetes is well controlled.  He is walking 2 miles a day.  He notes no concerns.   Past Medical History:  Diagnosis Date  . Anemia   . Arthritis   . BPH (benign prostatic hypertrophy)   . Complication of anesthesia    nausea  . Diabetes type 2, controlled ([place])   . HOH (hard of hearing)    r and L ears-70% loss per pt  . Hyperlipidemia   . Hypertension     Past Surgical History:  Procedure Laterality Date  . APPENDECTOMY    . COLONOSCOPY  09/21/08   1 polyp found, tubular adenoma  . HAND SURGERY    . KNEE SURGERY Left     Family History  Problem Relation Age of Onset  . Aneurysm Mother   . Heart disease Father   . Cancer Brother        skin  . Heart disease Brother   . Heart disease Brother   . Cancer Sister        skin  . Aneurysm Brother   . Heart disease Brother   . Heart disease Sister   . Heart disease Sister   . COPD Sister   . Diabetes Sister     Social History:  reports that he has never smoked. He has never used smokeless tobacco. He reports that he does not drink alcohol or use drugs. 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 Patient denies known exposures to radiation on toxins. 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 He had his 5-year marriage anniversary on 08/23/2018.He is walking  1-63miles per day.The patient isalonetoday.  Allergies: No Known Allergies  Current Medications: Current Outpatient Medications  Medication Sig Dispense Refill  . aspirin 81 MG chewable tablet Chew 81 mg by mouth daily.    . Cranberry (THERACRAN PO) Take 1 tablet by mouth daily.     . cyanocobalamin 1000 MCG tablet Take 1,000 mcg by mouth daily.    . finasteride (PROSCAR) 5 MG tablet Take 5 mg by mouth daily.    Marland Kitchen glimepiride (AMARYL) 1 MG tablet Take 1 tablet by mouth daily after breakfast.    . glucose blood test strip FreeStyle Lite Strips    . LANCETS ULTRA FINE MISC Lancets,Ultra Thin 26 gauge    . lisinopril (PRINIVIL,ZESTRIL) 10 MG tablet Take 10 mg by mouth daily.    Marland Kitchen loperamide (IMODIUM) 2 MG capsule Take 1 capsule (2 mg total) by mouth See admin instructions. With onset of loose stool, take 4mg  followed by 2mg  every 2 hours until loose bowel movement stopped. Maximum: 16 mg/day 30 capsule 0  . meclizine (ANTIVERT) 25 MG tablet Take 25 mg by mouth 2 (two) times daily as needed.     . metformin (FORTAMET) 1000 MG (  OSM) 24 hr tablet Take 1,000 mg by mouth 2 (two) times daily with a meal.    . Multiple Vitamins-Minerals (CENTRUM SILVER PO) Take by mouth.    . Omega-3 Fatty Acids (FISH OIL PO) Take 1 tablet by mouth daily.     . Polyethylene Glycol 3350 (MIRALAX PO) Take by mouth as needed.     [name] (PROSTATE SR PO) Take 1 tablet by mouth daily.     . simvastatin (ZOCOR) 40 MG tablet Take 40 mg by mouth daily.    . ondansetron (ZOFRAN) 4 MG tablet Take 1 tablet (4 mg total) by mouth every 6 (six) hours as needed for nausea or vomiting. (Patient not taking: Reported on 04/17/2019) 30 tablet 0   No current facility-administered medications for this visit.    Facility-Administered Medications Ordered in Other Visits  Medication Dose Route Frequency Provider Last Rate Last Dose  . ondansetron (ZOFRAN) tablet 8 mg  8 mg Oral Once Lequita Asal, MD         Review of Systems  Constitutional: Negative.  Negative for chills, diaphoresis, fever, malaise/fatigue and weight loss (up 1 pound).       Feels "alright".  No concerns.  Walking daily.  HENT: Positive for hearing loss. Negative for congestion, ear pain, nosebleeds, sinus pain and sore throat.   Eyes: Negative.  Negative for blurred vision, double vision, photophobia and pain.  Respiratory: Negative.  Negative for cough, sputum production, shortness of breath and wheezing.   Cardiovascular: Negative.  Negative for chest pain, palpitations, orthopnea, leg swelling and PND.  Gastrointestinal: Negative.  Negative for abdominal pain, blood in stool, constipation, diarrhea, melena, nausea and vomiting.       Eating well.  Genitourinary: Negative.  Negative for dysuria, frequency, hematuria and urgency.  Musculoskeletal: Negative.  Negative for back pain, myalgias and neck pain.  Skin: Negative.  Negative for itching and rash.  Neurological: Negative.  Negative for dizziness, tingling, sensory change, focal weakness, weakness and headaches.  Endo/Heme/Allergies: Negative.  Does not bruise/bleed easily.       Diabetes.  Psychiatric/Behavioral: Negative.  Negative for depression, memory loss and substance abuse. The patient is not nervous/anxious and does not have insomnia.   All other systems reviewed and are negative.  Performance status (ECOG): 0  Vitals Blood pressure (!) 141/69, pulse 74, temperature 97.9 F (36.6 C), temperature source Tympanic, resp. rate 18, height 6\' 1"  (1.854 m), weight 179 lb 12.6 oz (81.5 kg), SpO2 100 %.   Physical Exam  Constitutional: He is oriented to person, place, and time. He appears well-developed and well-nourished. No distress.  HENT:  Head: Normocephalic and atraumatic.  Mouth/Throat: Oropharynx is clear and moist. No oropharyngeal exudate.  Sparse short gray hair.  Near alopecia/male pattern baldness.  Mask.  Hearing aid.    Eyes: Pupils are equal,  round, and reactive to light. Conjunctivae and EOM are normal. No scleral icterus.  Gold rimmed glasses.  Blue eyes.  Neck: Normal range of motion. Neck supple. No JVD present.  Cardiovascular: Normal rate, regular rhythm and normal heart sounds. Exam reveals no gallop and no friction rub.  No murmur heard. Pulmonary/Chest: Effort normal and breath sounds normal. No respiratory distress. He has no wheezes. He has no rales.  Abdominal: Soft. Bowel sounds are normal. He exhibits no distension and no mass. There is no abdominal tenderness. There is no rebound and no guarding.  Musculoskeletal: Normal range of motion.  General: No tenderness or edema.  Lymphadenopathy:    He has no cervical adenopathy.    He has no axillary adenopathy.       Right: No supraclavicular adenopathy present.       Left: No supraclavicular adenopathy present.  Neurological: He is alert and oriented to person, place, and time. He has normal reflexes.  Skin: Skin is warm and dry. No rash noted. He is not diaphoretic. No erythema. No pallor.  Psychiatric: He has a normal mood and affect. His behavior is normal. Judgment and thought content normal.  Nursing note and vitals reviewed.   Appointment on 04/24/2019  Component Date Value Ref Range Status  . Sodium 04/24/2019 137  135 - 145 mmol/L Final  . Potassium 04/24/2019 4.4  3.5 - 5.1 mmol/L Final  . Chloride 04/24/2019 104  98 - 111 mmol/L Final  . CO2 04/24/2019 25  22 - 32 mmol/L Final  . Glucose, Bld 04/24/2019 155* 70 - 99 mg/dL Final  . BUN 04/24/2019 19  8 - 23 mg/dL Final  . Creatinine, Ser 04/24/2019 0.63  0.61 - 1.24 mg/dL Final  . Calcium 04/24/2019 8.8* 8.9 - 10.3 mg/dL Final  . Total Protein 04/24/2019 6.8  6.5 - 8.1 g/dL Final  . Albumin 04/24/2019 4.1  3.5 - 5.0 g/dL Final  . AST 04/24/2019 21  15 - 41 U/L Final  . ALT 04/24/2019 15  0 - 44 U/L Final  . Alkaline Phosphatase 04/24/2019 26* 38 - 126 U/L Final  . Total Bilirubin 04/24/2019 0.7   0.3 - 1.2 mg/dL Final  . GFR calc non Af Amer 04/24/2019 >60  >60 mL/min Final  . GFR calc Af Amer 04/24/2019 >60  >60 mL/min Final  . Anion gap 04/24/2019 8  5 - 15 Final   Performed at [facility], [address], [place], [place] [address]  . WBC 04/24/2019 3.5* 4.0 - 10.5 K/uL Final  . RBC 04/24/2019 2.90* 4.22 - 5.81 MIL/uL Final  . Hemoglobin 04/24/2019 7.7* 13.0 - 17.0 g/dL Final  . HCT 04/24/2019 26.2* 39.0 - 52.0 % Final  . MCV 04/24/2019 90.3  80.0 - 100.0 fL Final  . MCH 04/24/2019 26.6  26.0 - 34.0 pg Final  . MCHC 04/24/2019 29.4* 30.0 - 36.0 g/dL Final  . RDW 04/24/2019 17.2* 11.5 - 15.5 % Final  . Platelets 04/24/2019 236  150 - 400 K/uL Final  . nRBC 04/24/2019 0.6* 0.0 - 0.2 % Final  . Neutrophils Relative % 04/24/2019 46  % Final  . Neutro Abs 04/24/2019 1.6* 1.7 - 7.7 K/uL Final  . Lymphocytes Relative 04/24/2019 35  % Final  . Lymphs Abs 04/24/2019 1.2  0.7 - 4.0 K/uL Final  . Monocytes Relative 04/24/2019 16  % Final  . Monocytes Absolute 04/24/2019 0.6  0.1 - 1.0 K/uL Final  . Eosinophils Relative 04/24/2019 1  % Final  . Eosinophils Absolute 04/24/2019 0.0  0.0 - 0.5 K/uL Final  . Basophils Relative 04/24/2019 0  % Final  . Basophils Absolute 04/24/2019 0.0  0.0 - 0.1 K/uL Final  . Immature Granulocytes 04/24/2019 2  % Final  . Abs Immature Granulocytes 04/24/2019 0.08* 0.00 - 0.07 K/uL Final   Performed at [facility], [address], [place], [place] [address]    Assessment:  Paul Simonet. is a 85 y.o. 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 Bone marrowon 09/22/2017 revealed a hypercellular for age with dyspoietic changes variably involving myeloid cell lines, but with  main involvement of the granulocytic/monocytic cell line. This was associated with bone marrow monocytosis and borderline number to slight increase in blastic cells. The overall changes favor a primary myeloid neoplasm, particularly a myelodysplastic syndrome especially refractory  anemia with excess blasts (RAEB-1) or possibly refractory cytopenia with multilineage dysplasia. Consideration was also given to an evolving myelodysplastic/myeloproliferative neoplasm such as chronic myelomonocytic leukemia but the lack of absolute peripheral monocytosis precluded such a diagnosis at this time. Flow cytometrywas negative. Cytogeneticswere normal (46, XY). Foundation one was positive for ASXL1 [id number], EZH2 Splice site [id number], RUNX1 [id number]. IPSS-R4.5, intermediate group.  Anemia work-up on 08/17/2017: Vitamin B12 (658), folate (22.0), and TSH (2.688). Retic was 2.6%. Haptoglobin was 116 on 08/19/2017. Epo levelwas 104.8 on 10/05/2017.  Ferritin has been followed: 106 on 08/17/2017, 131 on 11/04/2017, 144 on 05/30/2018, 394 on 09/01/2018, 441 on 11/28/2018, 394 on 01/02/2019, 480 on 02/06/2019, 392 on 03/13/2019, and 527 on 04/17/2019. Iron saturation was 34% on 08/17/2017 and 89% on 09/01/2018.  Bone marrowon 09/22/2017 revealed a hypercellular for age with dyspoietic changes variably involving myeloid cell lines, but with main involvement of the granulocytic/monocytic cell line. This was associated with bone marrow monocytosis and borderline number to slight increase in blasts (5%). The overall changes favor a primary myeloid neoplasm, particularly a myelodysplastic syndrome especially refractory anemia with excess blasts (RAEB-1) or possibly refractory cytopenia with multilineage dysplasia. Consideration was also given to an evolving myelodysplastic/myeloproliferative neoplasm such as chronic myelomonocytic leukemia but the lack of absolute peripheral monocytosis precluded such a diagnosis at this time. Flow cytometrywas negative. Cytogeneticswere normal (46, XY). Foundation One was positive for ASXL1 [id number], EZH2 Splice site [id number], RUNX1 [id number].  He has received 14cycles ofVidaza(11/04/2017 - 08/22/2018; 10/24/2018- 02/06/2019). He has received  Aranesp(initially 150 mcg every 2 weeks post chemotherapy then 300 mcg every 1-2 weeks after cycle #3; last 09/19/2018). He last received Aranesp on 04/17/2019. He has received 1-2 units of PRBCswith each cycle (last 04/05/2019).He has received18units of PRBCs since 08/17/2017.  Bone marrowon 08/12/2018 revealed a hypercellular marrow with dyspoietic changes. There was a borderline number of blasts (5%) seen by morphology. The findings were similar to previous biopsy although the cellularity is slightly higher in the current material.consistent with previously known myelodysplastic syndrome. Flow cytometry was negative. Cytogenetics were normal (46, XY).  Symptomatically, he is doing well.  He voices no concerns.  Exam is stable.  ANC is 1500.  Plan: 1.   Labs today:CBC with diff, CMP, hold tube.   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    He is tolerating treatment without side effect.     Treatment goal remains to decrease transfusion dependence and prevent acceleration of disease (blasts).  Review counts today.  WBC 3500 with an ANC of 1600.             Begin cycle #16 Vidaza day 1-5 today. He last received Aranesp on 04/17/2019.  Continue Aranesp every 2 weeks to decrease transfusion dependence. 3. Iron overload Ferritin 527 on 04/17/2019.  He has received 18 units of PRBCs to date (08/17/2017 - 04/05/2019). Anticipate anticipate initiation of an oral chelating agent if ferritin > 1000. 4.   RTC in 1 weeks for labs (CBC with diff, hold tube), +/- Aranesp. 5.   RTC in 3 weeks for labs (CBC with diff, hold tube), +/- Aranesp. 6.   RTC in 5 weeks for labs (CBC with diff, hold tube), +/- Aranesp. 7.   RTC in 6  weeks for MD assessment, labs (CBC with diff, CMP, ferritin, hold tube) and cycle #17 Vidaza (day 1-5).   I discussed the assessment and treatment plan with the patient.  The  patient was provided an opportunity to ask questions and all were answered.  The patient agreed with the plan and demonstrated an understanding of the instructions.  The patient was advised to call back if the symptoms worsen or if the condition fails to improve as anticipated.   Lequita Asal, MD, PhD    04/24/2019, 12:01 PM

## 2019-04-24 ENCOUNTER — Inpatient Hospital Stay (HOSPITAL_BASED_OUTPATIENT_CLINIC_OR_DEPARTMENT_OTHER): Payer: Medicare Other | Admitting: Hematology and Oncology

## 2019-04-24 ENCOUNTER — Other Ambulatory Visit: Payer: Self-pay

## 2019-04-24 ENCOUNTER — Inpatient Hospital Stay: Payer: Medicare Other

## 2019-04-24 ENCOUNTER — Encounter: Payer: Self-pay | Admitting: Hematology and Oncology

## 2019-04-24 VITALS — BP 141/69 | HR 74 | Temp 97.9°F | Resp 18 | Ht 73.0 in | Wt 179.8 lb

## 2019-04-24 DIAGNOSIS — Z5111 Encounter for antineoplastic chemotherapy: Secondary | ICD-10-CM

## 2019-04-24 DIAGNOSIS — D469 Myelodysplastic syndrome, unspecified: Secondary | ICD-10-CM

## 2019-04-24 LAB — CBC WITH DIFFERENTIAL/PLATELET
Abs Immature Granulocytes: 0.08 10*3/uL — ABNORMAL HIGH (ref 0.00–0.07)
Basophils Absolute: 0 10*3/uL (ref 0.0–0.1)
Basophils Relative: 0 %
Eosinophils Absolute: 0 10*3/uL (ref 0.0–0.5)
Eosinophils Relative: 1 %
HCT: 26.2 % — ABNORMAL LOW (ref 39.0–52.0)
Hemoglobin: 7.7 g/dL — ABNORMAL LOW (ref 13.0–17.0)
Immature Granulocytes: 2 %
Lymphocytes Relative: 35 %
Lymphs Abs: 1.2 10*3/uL (ref 0.7–4.0)
MCH: 26.6 pg (ref 26.0–34.0)
MCHC: 29.4 g/dL — ABNORMAL LOW (ref 30.0–36.0)
MCV: 90.3 fL (ref 80.0–100.0)
Monocytes Absolute: 0.6 10*3/uL (ref 0.1–1.0)
Monocytes Relative: 16 %
Neutro Abs: 1.6 10*3/uL — ABNORMAL LOW (ref 1.7–7.7)
Neutrophils Relative %: 46 %
Platelets: 236 10*3/uL (ref 150–400)
RBC: 2.9 MIL/uL — ABNORMAL LOW (ref 4.22–5.81)
RDW: 17.2 % — ABNORMAL HIGH (ref 11.5–15.5)
WBC: 3.5 10*3/uL — ABNORMAL LOW (ref 4.0–10.5)
nRBC: 0.6 % — ABNORMAL HIGH (ref 0.0–0.2)

## 2019-04-24 LAB — COMPREHENSIVE METABOLIC PANEL
ALT: 15 U/L (ref 0–44)
AST: 21 U/L (ref 15–41)
Albumin: 4.1 g/dL (ref 3.5–5.0)
Alkaline Phosphatase: 26 U/L — ABNORMAL LOW (ref 38–126)
Anion gap: 8 (ref 5–15)
BUN: 19 mg/dL (ref 8–23)
CO2: 25 mmol/L (ref 22–32)
Calcium: 8.8 mg/dL — ABNORMAL LOW (ref 8.9–10.3)
Chloride: 104 mmol/L (ref 98–111)
Creatinine, Ser: 0.63 mg/dL (ref 0.61–1.24)
GFR calc Af Amer: >60 mL/min (ref >60)
GFR calc non Af Amer: >60 mL/min (ref >60)
Glucose, Bld: 155 mg/dL — ABNORMAL HIGH (ref 70–99)
Potassium: 4.4 mmol/L (ref 3.5–5.1)
Sodium: 137 mmol/L (ref 135–145)
Total Bilirubin: 0.7 mg/dL (ref 0.3–1.2)
Total Protein: 6.8 g/dL (ref 6.5–8.1)

## 2019-04-24 LAB — SAMPLE TO BLOOD BANK

## 2019-04-24 MED ORDER — AZACITIDINE CHEMO SQ INJECTION
75.0000 mg/m2 | Freq: Once | INTRAMUSCULAR | Status: AC
  Administered 2019-04-24: 155 mg via SUBCUTANEOUS
  Filled 2019-04-24: qty 6.2

## 2019-04-24 MED ORDER — ONDANSETRON HCL 4 MG PO TABS: 8.0000 mg | ORAL_TABLET | Freq: Once | ORAL | Status: DC

## 2019-04-24 NOTE — Progress Notes (Signed)
No new changes noted today 

## 2019-04-25 ENCOUNTER — Inpatient Hospital Stay: Payer: Medicare Other

## 2019-04-25 VITALS — BP 138/67 | HR 87 | Temp 98.4°F | Resp 19

## 2019-04-25 DIAGNOSIS — Z5111 Encounter for antineoplastic chemotherapy: Secondary | ICD-10-CM | POA: Diagnosis not present

## 2019-04-25 DIAGNOSIS — D469 Myelodysplastic syndrome, unspecified: Secondary | ICD-10-CM

## 2019-04-25 MED ORDER — AZACITIDINE CHEMO SQ INJECTION
75.0000 mg/m2 | Freq: Once | INTRAMUSCULAR | Status: AC
  Administered 2019-04-25: 155 mg via SUBCUTANEOUS
  Filled 2019-04-25: qty 6.2

## 2019-04-25 MED ORDER — ONDANSETRON HCL 4 MG PO TABS: 8.0000 mg | ORAL_TABLET | Freq: Once | ORAL | Status: DC

## 2019-04-26 ENCOUNTER — Other Ambulatory Visit: Payer: Self-pay

## 2019-04-26 ENCOUNTER — Inpatient Hospital Stay: Payer: Medicare Other

## 2019-04-26 VITALS — BP 125/65 | HR 73 | Temp 98.6°F | Resp 16

## 2019-04-26 DIAGNOSIS — D469 Myelodysplastic syndrome, unspecified: Secondary | ICD-10-CM

## 2019-04-26 DIAGNOSIS — Z5111 Encounter for antineoplastic chemotherapy: Secondary | ICD-10-CM | POA: Diagnosis not present

## 2019-04-26 MED ORDER — AZACITIDINE CHEMO SQ INJECTION
75.0000 mg/m2 | Freq: Once | INTRAMUSCULAR | Status: AC
  Administered 2019-04-26: 11:00:00 155 mg via SUBCUTANEOUS
  Filled 2019-04-26: qty 6.2

## 2019-04-26 MED ORDER — ONDANSETRON HCL 4 MG PO TABS: 8.0000 mg | ORAL_TABLET | Freq: Once | ORAL | Status: DC

## 2019-04-27 ENCOUNTER — Inpatient Hospital Stay: Payer: Medicare Other

## 2019-04-27 VITALS — BP 125/64 | HR 67 | Temp 98.6°F | Resp 16

## 2019-04-27 DIAGNOSIS — Z5111 Encounter for antineoplastic chemotherapy: Secondary | ICD-10-CM | POA: Diagnosis not present

## 2019-04-27 DIAGNOSIS — D469 Myelodysplastic syndrome, unspecified: Secondary | ICD-10-CM

## 2019-04-27 MED ORDER — ONDANSETRON HCL 4 MG PO TABS: 8.0000 mg | ORAL_TABLET | Freq: Once | ORAL | Status: DC

## 2019-04-27 MED ORDER — AZACITIDINE CHEMO SQ INJECTION
75.0000 mg/m2 | Freq: Once | INTRAMUSCULAR | Status: AC
  Administered 2019-04-27: 155 mg via SUBCUTANEOUS
  Filled 2019-04-27: qty 6.2

## 2019-04-27 NOTE — Patient Instructions (Signed)
Azacitidine suspension for injection (subcutaneous use) What is this medicine? AZACITIDINE (ay za SITE i deen) is a chemotherapy drug. This medicine reduces the growth of cancer cells and can suppress the immune system. It is used for treating myelodysplastic syndrome or some types of leukemia. This medicine may be used for other purposes; ask your health care provider or pharmacist if you have questions. COMMON BRAND NAME(S): Vidaza What should I tell my health care provider before I take this medicine? They need to know if you have any of these conditions:  kidney disease  liver disease  liver tumors  an unusual or allergic reaction to azacitidine, mannitol, other medicines, foods, dyes, or preservatives  pregnant or trying to get pregnant  breast-feeding How should I use this medicine? This medicine is for injection under the skin. It is administered in a hospital or clinic by a specially trained health care professional. Talk to your pediatrician regarding the use of this medicine in children. While this drug may be prescribed for selected conditions, precautions do apply. Overdosage: If you think you have taken too much of this medicine contact a poison control center or emergency room at once. NOTE: This medicine is only for you. Do not share this medicine with others. What if I miss a dose? It is important not to miss your dose. Call your doctor or health care professional if you are unable to keep an appointment. What may interact with this medicine? Interactions have not been studied. Give your health care provider a list of all the medicines, herbs, non-prescription drugs, or dietary supplements you use. Also tell them if you smoke, drink alcohol, or use illegal drugs. Some items may interact with your medicine. This list may not describe all possible interactions. Give your health care provider a list of all the medicines, herbs, non-prescription drugs, or dietary supplements  you use. Also tell them if you smoke, drink alcohol, or use illegal drugs. Some items may interact with your medicine. What should I watch for while using this medicine? Visit your doctor for checks on your progress. This drug may make you feel generally unwell. This is not uncommon, as chemotherapy can affect healthy cells as well as cancer cells. Report any side effects. Continue your course of treatment even though you feel ill unless your doctor tells you to stop. In some cases, you may be given additional medicines to help with side effects. Follow all directions for their use. Call your doctor or health care professional for advice if you get a fever, chills or sore throat, or other symptoms of a cold or flu. Do not treat yourself. This drug decreases your body's ability to fight infections. Try to avoid being around people who are sick. This medicine may increase your risk to bruise or bleed. Call your doctor or health care professional if you notice any unusual bleeding. You may need blood work done while you are taking this medicine. Do not become pregnant while taking this medicine and for 6 months after the last dose. Women should inform their doctor if they wish to become pregnant or think they might be pregnant. Men should not father a child while taking this medicine and for 3 months after the last dose. There is a potential for serious side effects to an unborn child. Talk to your health care professional or pharmacist for more information. Do not breast-feed an infant while taking this medicine and for 1 week after the last dose. This medicine may interfere with   the ability to have a child. Talk with your doctor or health care professional if you are concerned about your fertility. What side effects may I notice from receiving this medicine? Side effects that you should report to your doctor or health care professional as soon as possible:  allergic reactions like skin rash, itching or  hives, swelling of the face, lips, or tongue  low blood counts - this medicine may decrease the number of white blood cells, red blood cells and platelets. You may be at increased risk for infections and bleeding.  signs of infection - fever or chills, cough, sore throat, pain passing urine  signs of decreased platelets or bleeding - bruising, pinpoint red spots on the skin, black, tarry stools, blood in the urine  signs of decreased red blood cells - unusually weak or tired, fainting spells, lightheadedness  signs and symptoms of kidney injury like trouble passing urine or change in the amount of urine  signs and symptoms of liver injury like dark yellow or brown urine; general ill feeling or flu-like symptoms; light-colored stools; loss of appetite; nausea; right upper belly pain; unusually weak or tired; yellowing of the eyes or skin Side effects that usually do not require medical attention (report to your doctor or health care professional if they continue or are bothersome):  constipation  diarrhea  nausea, vomiting  pain or redness at the injection site  unusually weak or tired This list may not describe all possible side effects. Call your doctor for medical advice about side effects. You may report side effects to FDA at 1-800-FDA-1088. Where should I keep my medicine? This drug is given in a hospital or clinic and will not be stored at home. NOTE: This sheet is a summary. It may not cover all possible information. If you have questions about this medicine, talk to your doctor, pharmacist, or health care provider.  2020 Elsevier/Gold Standard (2016-09-15 14:37:51)  

## 2019-04-28 ENCOUNTER — Inpatient Hospital Stay: Payer: Medicare Other

## 2019-04-28 ENCOUNTER — Other Ambulatory Visit: Payer: Self-pay

## 2019-04-28 VITALS — BP 128/67 | HR 67 | Temp 97.5°F | Resp 18

## 2019-04-28 DIAGNOSIS — D469 Myelodysplastic syndrome, unspecified: Secondary | ICD-10-CM

## 2019-04-28 DIAGNOSIS — Z5111 Encounter for antineoplastic chemotherapy: Secondary | ICD-10-CM | POA: Diagnosis not present

## 2019-04-28 MED ORDER — AZACITIDINE CHEMO SQ INJECTION
75.0000 mg/m2 | Freq: Once | INTRAMUSCULAR | Status: AC
  Administered 2019-04-28: 155 mg via SUBCUTANEOUS
  Filled 2019-04-28: qty 6.2

## 2019-04-28 NOTE — Patient Instructions (Signed)
Azacitidine suspension for injection (subcutaneous use) What is this medicine? AZACITIDINE (ay za SITE i deen) is a chemotherapy drug. This medicine reduces the growth of cancer cells and can suppress the immune system. It is used for treating myelodysplastic syndrome or some types of leukemia. This medicine may be used for other purposes; ask your health care provider or pharmacist if you have questions. COMMON BRAND NAME(S): Vidaza What should I tell my health care provider before I take this medicine? They need to know if you have any of these conditions:  kidney disease  liver disease  liver tumors  an unusual or allergic reaction to azacitidine, mannitol, other medicines, foods, dyes, or preservatives  pregnant or trying to get pregnant  breast-feeding How should I use this medicine? This medicine is for injection under the skin. It is administered in a hospital or clinic by a specially trained health care professional. Talk to your pediatrician regarding the use of this medicine in children. While this drug may be prescribed for selected conditions, precautions do apply. Overdosage: If you think you have taken too much of this medicine contact a poison control center or emergency room at once. NOTE: This medicine is only for you. Do not share this medicine with others. What if I miss a dose? It is important not to miss your dose. Call your doctor or health care professional if you are unable to keep an appointment. What may interact with this medicine? Interactions have not been studied. Give your health care provider a list of all the medicines, herbs, non-prescription drugs, or dietary supplements you use. Also tell them if you smoke, drink alcohol, or use illegal drugs. Some items may interact with your medicine. This list may not describe all possible interactions. Give your health care provider a list of all the medicines, herbs, non-prescription drugs, or dietary supplements  you use. Also tell them if you smoke, drink alcohol, or use illegal drugs. Some items may interact with your medicine. What should I watch for while using this medicine? Visit your doctor for checks on your progress. This drug may make you feel generally unwell. This is not uncommon, as chemotherapy can affect healthy cells as well as cancer cells. Report any side effects. Continue your course of treatment even though you feel ill unless your doctor tells you to stop. In some cases, you may be given additional medicines to help with side effects. Follow all directions for their use. Call your doctor or health care professional for advice if you get a fever, chills or sore throat, or other symptoms of a cold or flu. Do not treat yourself. This drug decreases your body's ability to fight infections. Try to avoid being around people who are sick. This medicine may increase your risk to bruise or bleed. Call your doctor or health care professional if you notice any unusual bleeding. You may need blood work done while you are taking this medicine. Do not become pregnant while taking this medicine and for 6 months after the last dose. Women should inform their doctor if they wish to become pregnant or think they might be pregnant. Men should not father a child while taking this medicine and for 3 months after the last dose. There is a potential for serious side effects to an unborn child. Talk to your health care professional or pharmacist for more information. Do not breast-feed an infant while taking this medicine and for 1 week after the last dose. This medicine may interfere with   the ability to have a child. Talk with your doctor or health care professional if you are concerned about your fertility. What side effects may I notice from receiving this medicine? Side effects that you should report to your doctor or health care professional as soon as possible:  allergic reactions like skin rash, itching or  hives, swelling of the face, lips, or tongue  low blood counts - this medicine may decrease the number of white blood cells, red blood cells and platelets. You may be at increased risk for infections and bleeding.  signs of infection - fever or chills, cough, sore throat, pain passing urine  signs of decreased platelets or bleeding - bruising, pinpoint red spots on the skin, black, tarry stools, blood in the urine  signs of decreased red blood cells - unusually weak or tired, fainting spells, lightheadedness  signs and symptoms of kidney injury like trouble passing urine or change in the amount of urine  signs and symptoms of liver injury like dark yellow or brown urine; general ill feeling or flu-like symptoms; light-colored stools; loss of appetite; nausea; right upper belly pain; unusually weak or tired; yellowing of the eyes or skin Side effects that usually do not require medical attention (report to your doctor or health care professional if they continue or are bothersome):  constipation  diarrhea  nausea, vomiting  pain or redness at the injection site  unusually weak or tired This list may not describe all possible side effects. Call your doctor for medical advice about side effects. You may report side effects to FDA at 1-800-FDA-1088. Where should I keep my medicine? This drug is given in a hospital or clinic and will not be stored at home. NOTE: This sheet is a summary. It may not cover all possible information. If you have questions about this medicine, talk to your doctor, pharmacist, or health care provider.  2020 Elsevier/Gold Standard (2016-09-15 14:37:51)  

## 2019-05-01 ENCOUNTER — Other Ambulatory Visit: Payer: Self-pay | Admitting: *Deleted

## 2019-05-01 ENCOUNTER — Inpatient Hospital Stay: Payer: Medicare Other

## 2019-05-01 ENCOUNTER — Other Ambulatory Visit: Payer: Self-pay

## 2019-05-01 VITALS — BP 118/64 | HR 80 | Temp 98.5°F | Resp 16

## 2019-05-01 DIAGNOSIS — D469 Myelodysplastic syndrome, unspecified: Secondary | ICD-10-CM

## 2019-05-01 DIAGNOSIS — Z5111 Encounter for antineoplastic chemotherapy: Secondary | ICD-10-CM | POA: Diagnosis not present

## 2019-05-01 LAB — CBC WITH DIFFERENTIAL/PLATELET
Abs Immature Granulocytes: 0.14 10*3/uL — ABNORMAL HIGH (ref 0.00–0.07)
Basophils Absolute: 0 10*3/uL (ref 0.0–0.1)
Basophils Relative: 0 %
Eosinophils Absolute: 0 10*3/uL (ref 0.0–0.5)
Eosinophils Relative: 0 %
HCT: 25.5 % — ABNORMAL LOW (ref 39.0–52.0)
Hemoglobin: 7.5 g/dL — ABNORMAL LOW (ref 13.0–17.0)
Immature Granulocytes: 3 %
Lymphocytes Relative: 32 %
Lymphs Abs: 1.5 10*3/uL (ref 0.7–4.0)
MCH: 26.5 pg (ref 26.0–34.0)
MCHC: 29.4 g/dL — ABNORMAL LOW (ref 30.0–36.0)
MCV: 90.1 fL (ref 80.0–100.0)
Monocytes Absolute: 0.7 10*3/uL (ref 0.1–1.0)
Monocytes Relative: 14 %
Neutro Abs: 2.5 10*3/uL (ref 1.7–7.7)
Neutrophils Relative %: 51 %
Platelets: 240 10*3/uL (ref 150–400)
RBC: 2.83 MIL/uL — ABNORMAL LOW (ref 4.22–5.81)
RDW: 17 % — ABNORMAL HIGH (ref 11.5–15.5)
WBC: 4.8 10*3/uL (ref 4.0–10.5)
nRBC: 0 % (ref 0.0–0.2)

## 2019-05-01 LAB — SAMPLE TO BLOOD BANK

## 2019-05-01 MED ORDER — DARBEPOETIN ALFA 300 MCG/0.6ML IJ SOSY
PREFILLED_SYRINGE | INTRAMUSCULAR | Status: AC
  Filled 2019-05-01: qty 0.6

## 2019-05-01 MED ORDER — DARBEPOETIN ALFA 300 MCG/0.6ML IJ SOSY
300.0000 ug | PREFILLED_SYRINGE | Freq: Once | INTRAMUSCULAR | Status: AC
  Administered 2019-05-01: 12:00:00 300 ug via SUBCUTANEOUS

## 2019-05-01 NOTE — Progress Notes (Signed)
Patient here for labs.  Hemoglobin 7.5.  Patient given aranesp as ordered.  Patient to return for labs on Wednesday and possible blood Thursday.  MD aware.  Patient given witting instructions for those appointments and verbalized understanding.

## 2019-05-01 NOTE — Patient Instructions (Signed)
Darbepoetin Alfa injection What is this medicine? DARBEPOETIN ALFA (dar be POE e tin AL fa) helps your body make more red blood cells. It is used to treat anemia caused by chronic kidney failure and chemotherapy. This medicine may be used for other purposes; ask your health care provider or pharmacist if you have questions. COMMON BRAND NAME(S): Aranesp What should I tell my health care provider before I take this medicine? They need to know if you have any of these conditions:  blood clotting disorders or history of blood clots  cancer patient not on chemotherapy  cystic fibrosis  heart disease, such as angina, heart failure, or a history of a heart attack  hemoglobin level of 12 g/dL or greater  high blood pressure  low levels of folate, iron, or vitamin B12  seizures  an unusual or allergic reaction to darbepoetin, erythropoietin, albumin, hamster proteins, latex, other medicines, foods, dyes, or preservatives  pregnant or trying to get pregnant  breast-feeding How should I use this medicine? This medicine is for injection into a vein or under the skin. It is usually given by a health care professional in a hospital or clinic setting. If you get this medicine at home, you will be taught how to prepare and give this medicine. Use exactly as directed. Take your medicine at regular intervals. Do not take your medicine more often than directed. It is important that you put your used needles and syringes in a special sharps container. Do not put them in a trash can. If you do not have a sharps container, call your pharmacist or healthcare provider to get one. A special MedGuide will be given to you by the pharmacist with each prescription and refill. Be sure to read this information carefully each time. Talk to your pediatrician regarding the use of this medicine in children. While this medicine may be used in children as young as 1 month of age for selected conditions, precautions do  apply. Overdosage: If you think you have taken too much of this medicine contact a poison control center or emergency room at once. NOTE: This medicine is only for you. Do not share this medicine with others. What if I miss a dose? If you miss a dose, take it as soon as you can. If it is almost time for your next dose, take only that dose. Do not take double or extra doses. What may interact with this medicine? Do not take this medicine with any of the following medications:  epoetin alfa This list may not describe all possible interactions. Give your health care provider a list of all the medicines, herbs, non-prescription drugs, or dietary supplements you use. Also tell them if you smoke, drink alcohol, or use illegal drugs. Some items may interact with your medicine. What should I watch for while using this medicine? Your condition will be monitored carefully while you are receiving this medicine. You may need blood work done while you are taking this medicine. This medicine may cause a decrease in vitamin B6. You should make sure that you get enough vitamin B6 while you are taking this medicine. Discuss the foods you eat and the vitamins you take with your health care professional. What side effects may I notice from receiving this medicine? Side effects that you should report to your doctor or health care professional as soon as possible:  allergic reactions like skin rash, itching or hives, swelling of the face, lips, or tongue  breathing problems  changes in   vision  chest pain  confusion, trouble speaking or understanding  feeling faint or lightheaded, falls  high blood pressure  muscle aches or pains  pain, swelling, warmth in the leg  rapid weight gain  severe headaches  sudden numbness or weakness of the face, arm or leg  trouble walking, dizziness, loss of balance or coordination  seizures (convulsions)  swelling of the ankles, feet, hands  unusually weak or  tired Side effects that usually do not require medical attention (report to your doctor or health care professional if they continue or are bothersome):  diarrhea  fever, chills (flu-like symptoms)  headaches  nausea, vomiting  redness, stinging, or swelling at site where injected This list may not describe all possible side effects. Call your doctor for medical advice about side effects. You may report side effects to FDA at 1-800-FDA-1088. Where should I keep my medicine? Keep out of the reach of children. Store in a refrigerator between 2 and 8 degrees C (36 and 46 degrees F). Do not freeze. Do not shake. Throw away any unused portion if using a single-dose vial. Throw away any unused medicine after the expiration date. NOTE: This sheet is a summary. It may not cover all possible information. If you have questions about this medicine, talk to your doctor, pharmacist, or health care provider.  2020 [employer] Standard (2017-09-01 16:44:20)  

## 2019-05-02 ENCOUNTER — Inpatient Hospital Stay: Payer: Medicare Other

## 2019-05-03 ENCOUNTER — Inpatient Hospital Stay: Payer: Medicare Other | Attending: Hematology and Oncology

## 2019-05-03 ENCOUNTER — Other Ambulatory Visit: Payer: Self-pay

## 2019-05-03 ENCOUNTER — Other Ambulatory Visit: Payer: Self-pay | Admitting: Hematology and Oncology

## 2019-05-03 DIAGNOSIS — D469 Myelodysplastic syndrome, unspecified: Secondary | ICD-10-CM

## 2019-05-03 DIAGNOSIS — D649 Anemia, unspecified: Secondary | ICD-10-CM

## 2019-05-03 LAB — SAMPLE TO BLOOD BANK

## 2019-05-03 LAB — CBC WITH DIFFERENTIAL/PLATELET
Abs Immature Granulocytes: 0.11 10*3/uL — ABNORMAL HIGH (ref 0.00–0.07)
Basophils Absolute: 0 10*3/uL (ref 0.0–0.1)
Basophils Relative: 0 %
Eosinophils Absolute: 0 10*3/uL (ref 0.0–0.5)
Eosinophils Relative: 0 %
HCT: 23.4 % — ABNORMAL LOW (ref 39.0–52.0)
Hemoglobin: 6.9 g/dL — ABNORMAL LOW (ref 13.0–17.0)
Immature Granulocytes: 4 %
Lymphocytes Relative: 41 %
Lymphs Abs: 1.2 10*3/uL (ref 0.7–4.0)
MCH: 26.6 pg (ref 26.0–34.0)
MCHC: 29.5 g/dL — ABNORMAL LOW (ref 30.0–36.0)
MCV: 90.3 fL (ref 80.0–100.0)
Monocytes Absolute: 0.5 10*3/uL (ref 0.1–1.0)
Monocytes Relative: 16 %
Neutro Abs: 1.2 10*3/uL — ABNORMAL LOW (ref 1.7–7.7)
Neutrophils Relative %: 39 %
Platelets: 199 10*3/uL (ref 150–400)
RBC: 2.59 MIL/uL — ABNORMAL LOW (ref 4.22–5.81)
RDW: 16.9 % — ABNORMAL HIGH (ref 11.5–15.5)
WBC: 3 10*3/uL — ABNORMAL LOW (ref 4.0–10.5)
nRBC: 0 % (ref 0.0–0.2)

## 2019-05-03 LAB — PREPARE RBC (CROSSMATCH)

## 2019-05-04 ENCOUNTER — Inpatient Hospital Stay: Payer: Medicare Other

## 2019-05-04 DIAGNOSIS — D649 Anemia, unspecified: Secondary | ICD-10-CM

## 2019-05-04 DIAGNOSIS — D469 Myelodysplastic syndrome, unspecified: Secondary | ICD-10-CM

## 2019-05-04 MED ORDER — ACETAMINOPHEN 325 MG PO TABS: 650.0000 mg | ORAL_TABLET | Freq: Once | ORAL | Status: DC

## 2019-05-04 MED ORDER — SODIUM CHLORIDE 0.9% IV SOLUTION
250.0000 mL | Freq: Once | INTRAVENOUS | Status: AC
  Administered 2019-05-04: 10:00:00 250 mL via INTRAVENOUS
  Filled 2019-05-04: qty 250

## 2019-05-04 MED ORDER — DIPHENHYDRAMINE HCL 25 MG PO CAPS: 25.0000 mg | ORAL_CAPSULE | Freq: Once | ORAL | Status: DC

## 2019-05-04 NOTE — Patient Instructions (Signed)
Blood Transfusion, Adult, Care After This sheet gives you information about how to care for yourself after your procedure. Your doctor may also give you more specific instructions. If you have problems or questions, contact your doctor. Follow these instructions at home:   Take over-the-counter and prescription medicines only as told by your doctor.  Go back to your normal activities as told by your doctor.  Follow instructions from your doctor about how to take care of the area where an IV tube was put into your vein (insertion site). Make sure you: ? Wash your hands with soap and water before you change your bandage (dressing). If there is no soap and water, use hand sanitizer. ? Change your bandage as told by your doctor.  Check your IV insertion site every day for signs of infection. Check for: ? More redness, swelling, or pain. ? More fluid or blood. ? Warmth. ? Pus or a bad smell. Contact a doctor if:  You have more redness, swelling, or pain around the IV insertion site.  You have more fluid or blood coming from the IV insertion site.  Your IV insertion site feels warm to the touch.  You have pus or a bad smell coming from the IV insertion site.  Your pee (urine) turns pink, red, or brown.  You feel weak after doing your normal activities. Get help right away if:  You have signs of a serious allergic or body defense (immune) system reaction, including: ? Itchiness. ? Hives. ? Trouble breathing. ? Anxiety. ? Pain in your chest or lower back. ? Fever, flushing, and chills. ? Fast pulse. ? Rash. ? Watery poop (diarrhea). ? Throwing up (vomiting). ? Dark pee. ? Serious headache. ? Dizziness. ? Stiff neck. ? Yellow color in your face or the white parts of your eyes (jaundice). Summary  After a blood transfusion, return to your normal activities as told by your doctor.  Every day, check for signs of infection where the IV tube was put into your vein.  Some  signs of infection are warm skin, more redness and pain, more fluid or blood, and pus or a bad smell where the needle went in.  Contact your doctor if you feel weak or have any unusual symptoms. This information is not intended to replace advice given to you by your health care provider. Make sure you discuss any questions you have with your health care provider. Document Released: 09/07/2014 Document Revised: 12/22/2017 Document Reviewed: 04/10/2016 Elsevier Patient Education  2020 Elsevier Inc.  

## 2019-05-05 LAB — TYPE AND SCREEN
ABO/RH(D): B POS
Antibody Screen: NEGATIVE
Unit division: 0

## 2019-05-05 LAB — BPAM RBC
Blood Product Expiration Date: 202009242359
ISSUE DATE / TIME: 202009030957
Unit Type and Rh: 5100

## 2019-05-12 ENCOUNTER — Other Ambulatory Visit: Payer: Self-pay

## 2019-05-15 ENCOUNTER — Inpatient Hospital Stay: Payer: Medicare Other

## 2019-05-15 ENCOUNTER — Other Ambulatory Visit: Payer: Self-pay

## 2019-05-15 VITALS — BP 135/50 | HR 75 | Temp 96.3°F | Resp 16

## 2019-05-15 DIAGNOSIS — D469 Myelodysplastic syndrome, unspecified: Secondary | ICD-10-CM | POA: Diagnosis not present

## 2019-05-15 LAB — CBC WITH DIFFERENTIAL/PLATELET
Abs Immature Granulocytes: 0.05 10*3/uL (ref 0.00–0.07)
Basophils Absolute: 0 10*3/uL (ref 0.0–0.1)
Basophils Relative: 0 %
Eosinophils Absolute: 0 10*3/uL (ref 0.0–0.5)
Eosinophils Relative: 0 %
HCT: 24.2 % — ABNORMAL LOW (ref 39.0–52.0)
Hemoglobin: 7.2 g/dL — ABNORMAL LOW (ref 13.0–17.0)
Immature Granulocytes: 2 %
Lymphocytes Relative: 35 %
Lymphs Abs: 1.2 10*3/uL (ref 0.7–4.0)
MCH: 26.9 pg (ref 26.0–34.0)
MCHC: 29.8 g/dL — ABNORMAL LOW (ref 30.0–36.0)
MCV: 90.3 fL (ref 80.0–100.0)
Monocytes Absolute: 0.4 10*3/uL (ref 0.1–1.0)
Monocytes Relative: 12 %
Neutro Abs: 1.7 10*3/uL (ref 1.7–7.7)
Neutrophils Relative %: 51 %
Platelets: 168 10*3/uL (ref 150–400)
RBC: 2.68 MIL/uL — ABNORMAL LOW (ref 4.22–5.81)
RDW: 18 % — ABNORMAL HIGH (ref 11.5–15.5)
WBC: 3.4 10*3/uL — ABNORMAL LOW (ref 4.0–10.5)
nRBC: 0.6 % — ABNORMAL HIGH (ref 0.0–0.2)

## 2019-05-15 LAB — SAMPLE TO BLOOD BANK

## 2019-05-15 MED ORDER — DARBEPOETIN ALFA 300 MCG/0.6ML IJ SOSY
300.0000 ug | PREFILLED_SYRINGE | Freq: Once | INTRAMUSCULAR | Status: AC
  Administered 2019-05-15: 11:00:00 300 ug via SUBCUTANEOUS

## 2019-05-15 NOTE — Patient Instructions (Signed)

## 2019-05-20 IMAGING — DX DG FINGER RING 2+V*R*
3 series · 3 of 3 positions shown · non-contrast
Comparison: None.

CLINICAL DATA: [AGE] male with a history of industrial
accident

EXAM:
RIGHT RING FINGER 2+V

[finger ap]
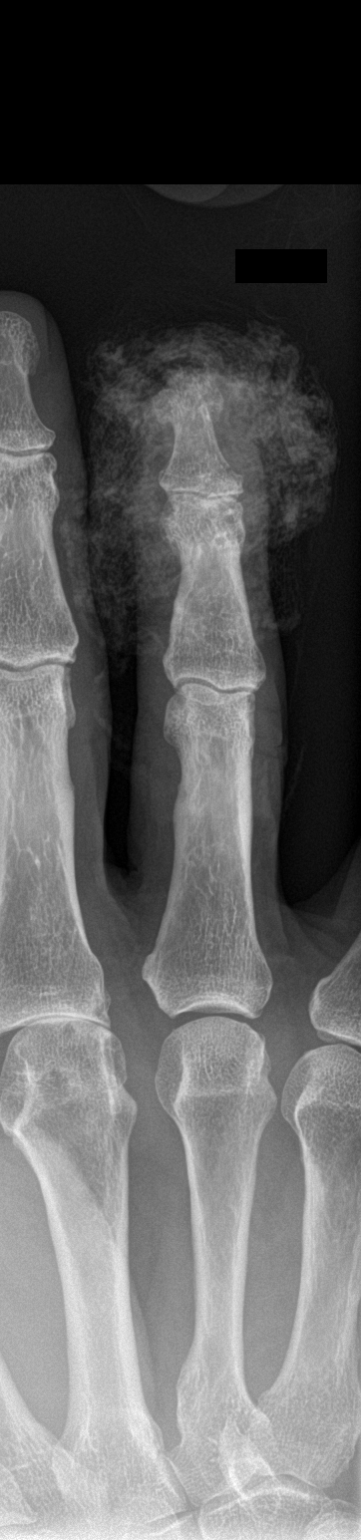

[finger obl]
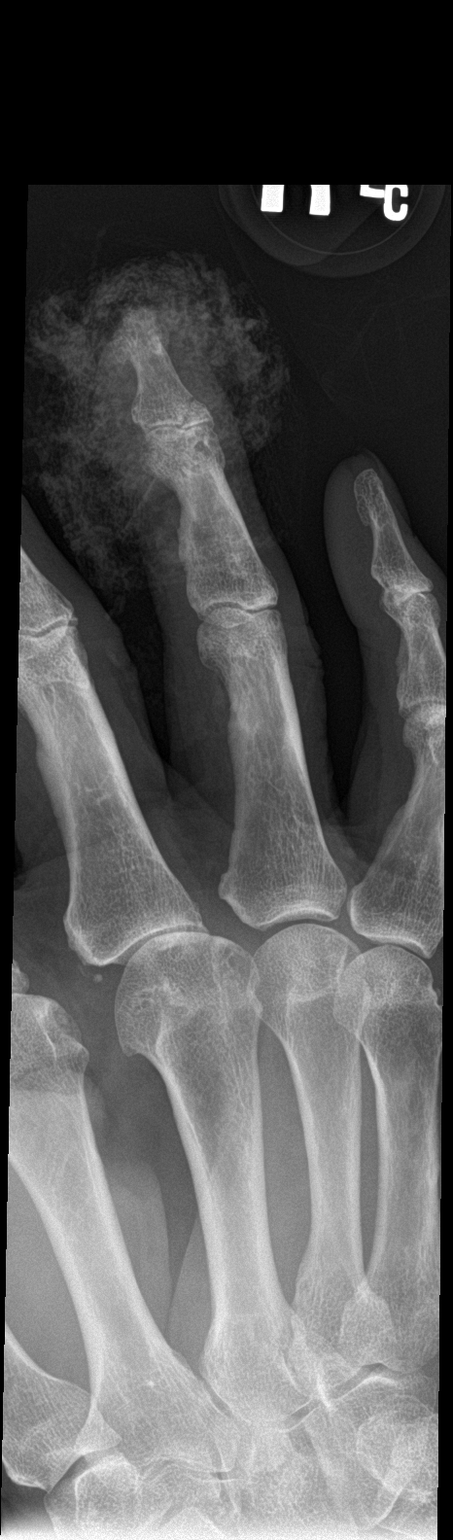

[finger lat]
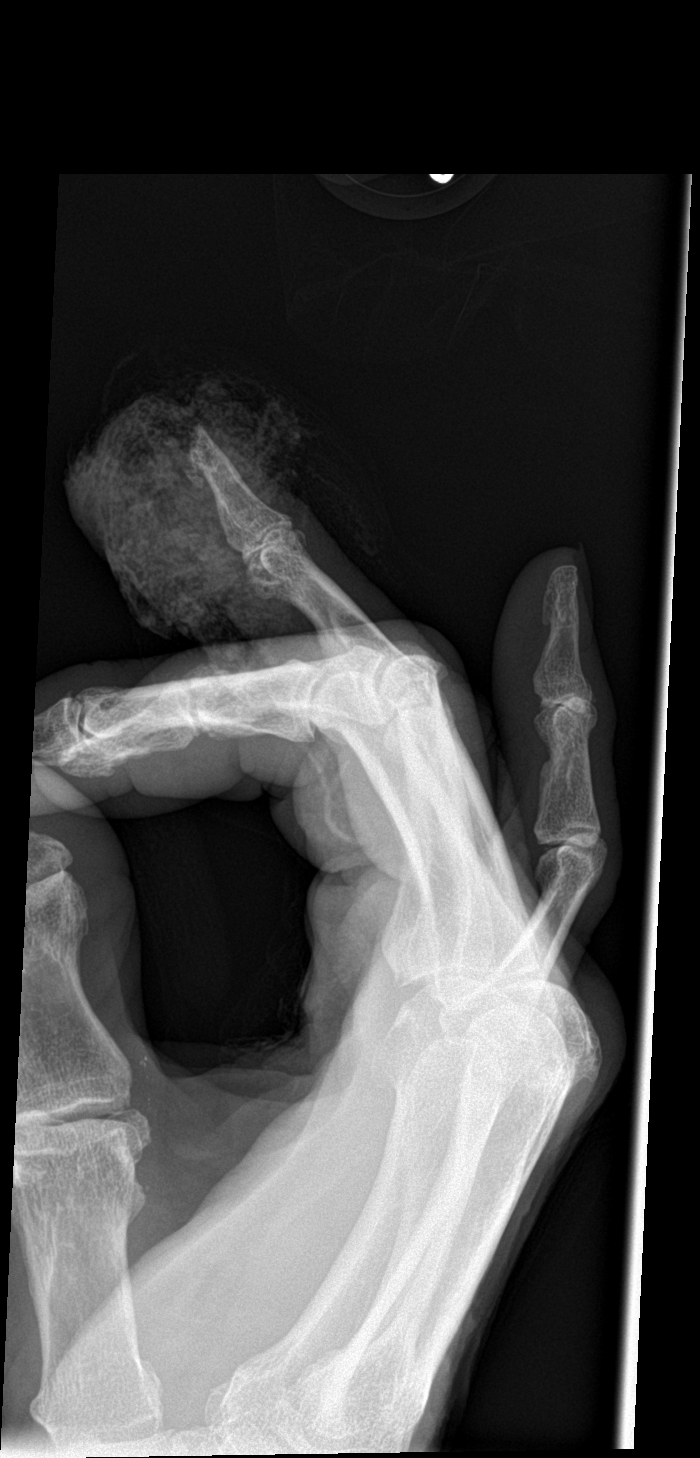

[3 of 3 positions shown; findings below may reference images not displayed]

FINDINGS: Lateral view demonstrates evidence of traumatic amputation of the
tuft of the fourth digit right hand, involving both soft tissue and
the distal bone.

Fracture at the base of the distal phalanx extending to the joint.
Irregularity at the distal middle phalanx extending to the joint.

No radiopaque foreign body. Soft tissue swelling present with
overlying surgical dressing obscuring some of the details.

Degenerative changes of the visualized joints of the other digits.
IMPRESSION: Traumatic amputation at the distal phalanx of the fourth digit,
right hand, with absent soft tissue and the distal tuft of the
phalanx.

Fracture at the base of the distal phalanx extending to the DIP.
There is questionable associated nondisplaced fracture at the distal
middle phalanx.

## 2019-05-26 ENCOUNTER — Other Ambulatory Visit: Payer: Self-pay

## 2019-05-29 ENCOUNTER — Inpatient Hospital Stay: Payer: Medicare Other

## 2019-05-29 ENCOUNTER — Other Ambulatory Visit: Payer: Self-pay

## 2019-05-29 VITALS — BP 118/59 | HR 65 | Temp 96.6°F | Resp 18

## 2019-05-29 DIAGNOSIS — D469 Myelodysplastic syndrome, unspecified: Secondary | ICD-10-CM

## 2019-05-29 LAB — CBC WITH DIFFERENTIAL/PLATELET
Abs Immature Granulocytes: 0.04 10*3/uL (ref 0.00–0.07)
Basophils Absolute: 0 10*3/uL (ref 0.0–0.1)
Basophils Relative: 0 %
Eosinophils Absolute: 0 10*3/uL (ref 0.0–0.5)
Eosinophils Relative: 0 %
HCT: 26.5 % — ABNORMAL LOW (ref 39.0–52.0)
Hemoglobin: 7.6 g/dL — ABNORMAL LOW (ref 13.0–17.0)
Immature Granulocytes: 1 %
Lymphocytes Relative: 35 %
Lymphs Abs: 1.5 10*3/uL (ref 0.7–4.0)
MCH: 25.9 pg — ABNORMAL LOW (ref 26.0–34.0)
MCHC: 28.7 g/dL — ABNORMAL LOW (ref 30.0–36.0)
MCV: 90.1 fL (ref 80.0–100.0)
Monocytes Absolute: 0.6 10*3/uL (ref 0.1–1.0)
Monocytes Relative: 14 %
Neutro Abs: 2.2 10*3/uL (ref 1.7–7.7)
Neutrophils Relative %: 50 %
Platelets: 290 10*3/uL (ref 150–400)
RBC: 2.94 MIL/uL — ABNORMAL LOW (ref 4.22–5.81)
RDW: 18.2 % — ABNORMAL HIGH (ref 11.5–15.5)
WBC: 4.4 10*3/uL (ref 4.0–10.5)
nRBC: 0.5 % — ABNORMAL HIGH (ref 0.0–0.2)

## 2019-05-29 LAB — COMPREHENSIVE METABOLIC PANEL
ALT: 16 U/L (ref 0–44)
AST: 19 U/L (ref 15–41)
Albumin: 4.4 g/dL (ref 3.5–5.0)
Alkaline Phosphatase: 27 U/L — ABNORMAL LOW (ref 38–126)
Anion gap: 6 (ref 5–15)
BUN: 22 mg/dL (ref 8–23)
CO2: 27 mmol/L (ref 22–32)
Calcium: 8.8 mg/dL — ABNORMAL LOW (ref 8.9–10.3)
Chloride: 102 mmol/L (ref 98–111)
Creatinine, Ser: 0.82 mg/dL (ref 0.61–1.24)
GFR calc Af Amer: >60 mL/min (ref >60)
GFR calc non Af Amer: >60 mL/min (ref >60)
Glucose, Bld: 187 mg/dL — ABNORMAL HIGH (ref 70–99)
Potassium: 4.5 mmol/L (ref 3.5–5.1)
Sodium: 135 mmol/L (ref 135–145)
Total Bilirubin: 0.6 mg/dL (ref 0.3–1.2)
Total Protein: 7.2 g/dL (ref 6.5–8.1)

## 2019-05-29 LAB — SAMPLE TO BLOOD BANK

## 2019-05-29 LAB — FERRITIN: Ferritin: 533 ng/mL — ABNORMAL HIGH (ref 24–336)

## 2019-05-29 MED ORDER — DARBEPOETIN ALFA 300 MCG/0.6ML IJ SOSY
300.0000 ug | PREFILLED_SYRINGE | Freq: Once | INTRAMUSCULAR | Status: AC
  Administered 2019-05-29: 300 ug via SUBCUTANEOUS
  Filled 2019-05-29: qty 0.6

## 2019-05-29 NOTE — Patient Instructions (Signed)
Darbepoetin Alfa injection What is this medicine? DARBEPOETIN ALFA (dar be POE e tin AL fa) helps your body make more red blood cells. It is used to treat anemia caused by chronic kidney failure and chemotherapy. This medicine may be used for other purposes; ask your health care provider or pharmacist if you have questions. COMMON BRAND NAME(S): Aranesp What should I tell my health care provider before I take this medicine? They need to know if you have any of these conditions:  blood clotting disorders or history of blood clots  cancer patient not on chemotherapy  cystic fibrosis  heart disease, such as angina, heart failure, or a history of a heart attack  hemoglobin level of 12 g/dL or greater  high blood pressure  low levels of folate, iron, or vitamin B12  seizures  an unusual or allergic reaction to darbepoetin, erythropoietin, albumin, hamster proteins, latex, other medicines, foods, dyes, or preservatives  pregnant or trying to get pregnant  breast-feeding How should I use this medicine? This medicine is for injection into a vein or under the skin. It is usually given by a health care professional in a hospital or clinic setting. If you get this medicine at home, you will be taught how to prepare and give this medicine. Use exactly as directed. Take your medicine at regular intervals. Do not take your medicine more often than directed. It is important that you put your used needles and syringes in a special sharps container. Do not put them in a trash can. If you do not have a sharps container, call your pharmacist or healthcare provider to get one. A special MedGuide will be given to you by the pharmacist with each prescription and refill. Be sure to read this information carefully each time. Talk to your pediatrician regarding the use of this medicine in children. While this medicine may be used in children as young as 1 month of age for selected conditions, precautions do  apply. Overdosage: If you think you have taken too much of this medicine contact a poison control center or emergency room at once. NOTE: This medicine is only for you. Do not share this medicine with others. What if I miss a dose? If you miss a dose, take it as soon as you can. If it is almost time for your next dose, take only that dose. Do not take double or extra doses. What may interact with this medicine? Do not take this medicine with any of the following medications:  epoetin alfa This list may not describe all possible interactions. Give your health care provider a list of all the medicines, herbs, non-prescription drugs, or dietary supplements you use. Also tell them if you smoke, drink alcohol, or use illegal drugs. Some items may interact with your medicine. What should I watch for while using this medicine? Your condition will be monitored carefully while you are receiving this medicine. You may need blood work done while you are taking this medicine. This medicine may cause a decrease in vitamin B6. You should make sure that you get enough vitamin B6 while you are taking this medicine. Discuss the foods you eat and the vitamins you take with your health care professional. What side effects may I notice from receiving this medicine? Side effects that you should report to your doctor or health care professional as soon as possible:  allergic reactions like skin rash, itching or hives, swelling of the face, lips, or tongue  breathing problems  changes in   vision  chest pain  confusion, trouble speaking or understanding  feeling faint or lightheaded, falls  high blood pressure  muscle aches or pains  pain, swelling, warmth in the leg  rapid weight gain  severe headaches  sudden numbness or weakness of the face, arm or leg  trouble walking, dizziness, loss of balance or coordination  seizures (convulsions)  swelling of the ankles, feet, hands  unusually weak or  tired Side effects that usually do not require medical attention (report to your doctor or health care professional if they continue or are bothersome):  diarrhea  fever, chills (flu-like symptoms)  headaches  nausea, vomiting  redness, stinging, or swelling at site where injected This list may not describe all possible side effects. Call your doctor for medical advice about side effects. You may report side effects to FDA at 1-800-FDA-1088. Where should I keep my medicine? Keep out of the reach of children. Store in a refrigerator between 2 and 8 degrees C (36 and 46 degrees F). Do not freeze. Do not shake. Throw away any unused portion if using a single-dose vial. Throw away any unused medicine after the expiration date. NOTE: This sheet is a summary. It may not cover all possible information. If you have questions about this medicine, talk to your doctor, pharmacist, or health care provider.  2020 [employer] Standard (2017-09-01 16:44:20)  

## 2019-06-02 ENCOUNTER — Other Ambulatory Visit: Payer: Self-pay

## 2019-06-02 DIAGNOSIS — D469 Myelodysplastic syndrome, unspecified: Secondary | ICD-10-CM

## 2019-06-03 NOTE — Progress Notes (Signed)
[facility]  [address], [address] [place], [place] [address] Phone: [phone]  Fax: [phone]   Clinic Day:  06/05/2019  Referring physician: Tracie Harrier, MD  Chief Complaint: Paul Wambach. is a 85 y.o. 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   HPI: The patient was last seen in the hematology clinic on 04/24/2019. At that time, he was doing well.  He voiced no concerns. Labs revealed Hematocrit 26.2, hemoglobin 7.7, MCV 90.3, platelets 236,000, WBC 3500,  ANC was 1500.  Creatinine 0.63, calcium 8.8, total protein 6.8, and albumin 4.1.   Labs have been followed: 05/01/2019:  Hematocrit 25.5, hemoglobin 7.5, MCV 90.1, platelets 240,000, WBC 4800, ANC 2500.  05/03/2019:  Hematocrit 23.4, hemoglobin 6.9, MCV 90.3, platelets 199,000, WBC 3000, ANC 1200.  05/15/2019:  Hematocrit 24.2, hemoglobin 7.2, MCV 90.3, platelets 168,000, WBC 6400, ANC 1700 05/29/2019:  Hematocrit 26.5, hemoglobin 7.6, MCV 90.1, platelets 290,000, WBC 4400, ANC 2200.  Ferritin 533. Creatinine 0.82, calcium 8.8, total protein 7.2, albumin 4.4  He received Aranesp 300 mcg on 05/01/2019, 05/15/2019, and 05/29/2019.  He received 1 unit of PRBCs on 05/04/2019.  During the interim, he has done well.  He denies any fever, adenopathy or bruising/bleeding.  He is still walking 1-2 miles a day. He is taking 1200 mg of calcium a day.   Past Medical History:  Diagnosis Date  . Anemia   . Arthritis   . BPH (benign prostatic hypertrophy)   . Complication of anesthesia    nausea  . Diabetes type 2, controlled ([place])   . HOH (hard of hearing)    r and L ears-70% loss per pt  . Hyperlipidemia   . Hypertension     Past Surgical History:  Procedure Laterality Date  . APPENDECTOMY    . COLONOSCOPY  09/21/08   1 polyp found, tubular adenoma  . HAND SURGERY    . KNEE SURGERY Left     Family History  Problem Relation Age of Onset  . Aneurysm  Mother   . Heart disease Father   . Cancer Brother        skin  . Heart disease Brother   . Heart disease Brother   . Cancer Sister        skin  . Aneurysm Brother   . Heart disease Brother   . Heart disease Sister   . Heart disease Sister   . COPD Sister   . Diabetes Sister     Social History:  reports that he has never smoked. He has never used smokeless tobacco. He reports that he does not drink alcohol or use drugs. 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 Patient denies known exposures to radiation on toxins. 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 He had his 61-year marriage anniversary on 08/23/2018.He is walking 1-93miles per day. He lives in [place].  The patient is alone today.  Allergies: No Known Allergies  Current Medications: Current Outpatient Medications  Medication Sig Dispense Refill  . aspirin 81 MG chewable tablet Chew 81 mg by mouth daily.    . Cranberry (THERACRAN PO) Take 1 tablet by mouth daily.     . cyanocobalamin 1000 MCG tablet Take 1,000 mcg by mouth daily.    . finasteride (PROSCAR) 5 MG tablet Take 5 mg by mouth daily.    Marland Kitchen glimepiride (AMARYL) 1 MG tablet Take 1 tablet by mouth  daily after breakfast.    . glucose blood test strip FreeStyle Lite Strips    . LANCETS ULTRA FINE MISC Lancets,Ultra Thin 26 gauge    . lisinopril (PRINIVIL,ZESTRIL) 10 MG tablet Take 10 mg by mouth daily.    . metformin (FORTAMET) 1000 MG (OSM) 24 hr tablet Take 1,000 mg by mouth 2 (two) times daily with a meal.    . Multiple Vitamins-Minerals (CENTRUM SILVER PO) Take by mouth.    . Omega-3 Fatty Acids (FISH OIL PO) Take 1 tablet by mouth daily.     [name] (PROSTATE SR PO) Take 1 tablet by mouth daily.     . simvastatin (ZOCOR) 40 MG tablet Take 40 mg by mouth daily.    Marland Kitchen loperamide (IMODIUM) 2 MG capsule Take 1 capsule (2 mg total) by mouth See admin instructions. With onset of loose stool, take 4mg  followed by 2mg  every 2 hours  until loose bowel movement stopped. Maximum: 16 mg/day (Patient not taking: Reported on 06/05/2019) 30 capsule 0  . meclizine (ANTIVERT) 25 MG tablet Take 25 mg by mouth 2 (two) times daily as needed.     . ondansetron (ZOFRAN) 4 MG tablet Take 1 tablet (4 mg total) by mouth every 6 (six) hours as needed for nausea or vomiting. (Patient not taking: Reported on 04/17/2019) 30 tablet 0  . Polyethylene Glycol 3350 (MIRALAX PO) Take by mouth as needed.      No current facility-administered medications for this visit.     Review of Systems  Constitutional: Positive for weight loss (2 lbs). Negative for chills, diaphoresis, fever and malaise/fatigue.       Feels "good".  No concerns.  Walks daily.  HENT: Positive for hearing loss. Negative for congestion, ear pain, nosebleeds, sinus pain and sore throat.   Eyes: Negative.  Negative for blurred vision, double vision, photophobia and pain.  Respiratory: Negative.  Negative for cough, sputum production, shortness of breath and wheezing.   Cardiovascular: Negative.  Negative for chest pain, palpitations, orthopnea, leg swelling and PND.  Gastrointestinal: Negative.  Negative for abdominal pain, blood in stool, constipation, diarrhea, melena, nausea and vomiting.       Eating well.  Genitourinary: Negative.  Negative for dysuria, frequency, hematuria and urgency.  Musculoskeletal: Negative.  Negative for back pain, myalgias and neck pain.  Skin: Negative.  Negative for itching and rash.  Neurological: Negative.  Negative for dizziness, tingling, sensory change, focal weakness, weakness and headaches.  Endo/Heme/Allergies: Negative.        Diabetes.  Psychiatric/Behavioral: Negative.  Negative for depression, memory loss and substance abuse. The patient is not nervous/anxious and does not have insomnia.   All other systems reviewed and are negative.  Performance status (ECOG): 0 - Asymptomatic  Vitals Blood pressure 137/71, pulse 72, temperature 97.6  F (36.4 C), temperature source Tympanic, resp. rate 18, height 6\' 1"  (1.854 m), weight 177 lb 4 oz (80.4 kg), SpO2 100 %.   Physical Exam  Constitutional: He is oriented to person, place, and time. He appears well-developed and well-nourished. No distress.  HENT:  Head: Normocephalic and atraumatic.  Mouth/Throat: Oropharynx is clear and moist. No oropharyngeal exudate.  Sparse short gray hair.  Near alopecia/male pattern baldness.  Mask.  Hearing aid.    Eyes: Pupils are equal, round, and reactive to light. Conjunctivae and EOM are normal. No scleral icterus.  Gold rimmed glasses.  Blue eyes.  Neck: Normal range of motion. Neck supple. No JVD present.  Cardiovascular: Normal  rate, regular rhythm and normal heart sounds. Exam reveals no gallop and no friction rub.  No murmur heard. Pulmonary/Chest: Effort normal and breath sounds normal. No respiratory distress. He has no wheezes. He has no rales.  Abdominal: Soft. Bowel sounds are normal. He exhibits no distension and no mass. There is no abdominal tenderness. There is no rebound and no guarding.  Musculoskeletal: Normal range of motion.        General: No tenderness or edema.  Lymphadenopathy:    He has no cervical adenopathy.    He has no axillary adenopathy.       Right: No supraclavicular adenopathy present.       Left: No supraclavicular adenopathy present.  Neurological: He is alert and oriented to person, place, and time. He has normal reflexes.  Skin: Skin is warm and dry. No rash noted. He is not diaphoretic. No erythema. No pallor.  Psychiatric: He has a normal mood and affect. His behavior is normal. Judgment and thought content normal.  Nursing note and vitals reviewed.   Appointment on 06/05/2019  Component Date Value Ref Range Status  . WBC 06/05/2019 3.8* 4.0 - 10.5 K/uL Final  . RBC 06/05/2019 2.80* 4.22 - 5.81 MIL/uL Final  . Hemoglobin 06/05/2019 7.4* 13.0 - 17.0 g/dL Final  . HCT 06/05/2019 25.6* 39.0 - 52.0 %  Final  . MCV 06/05/2019 91.4  80.0 - 100.0 fL Final  . MCH 06/05/2019 26.4  26.0 - 34.0 pg Final  . MCHC 06/05/2019 28.9* 30.0 - 36.0 g/dL Final  . RDW 06/05/2019 18.5* 11.5 - 15.5 % Final  . Platelets 06/05/2019 306  150 - 400 K/uL Final  . nRBC 06/05/2019 0.5* 0.0 - 0.2 % Final  . Neutrophils Relative % 06/05/2019 49  % Final  . Neutro Abs 06/05/2019 1.9  1.7 - 7.7 K/uL Final  . Lymphocytes Relative 06/05/2019 34  % Final  . Lymphs Abs 06/05/2019 1.3  0.7 - 4.0 K/uL Final  . Monocytes Relative 06/05/2019 12  % Final  . Monocytes Absolute 06/05/2019 0.5  0.1 - 1.0 K/uL Final  . Eosinophils Relative 06/05/2019 1  % Final  . Eosinophils Absolute 06/05/2019 0.0  0.0 - 0.5 K/uL Final  . Basophils Relative 06/05/2019 0  % Final  . Basophils Absolute 06/05/2019 0.0  0.0 - 0.1 K/uL Final  . Immature Granulocytes 06/05/2019 4  % Final  . Abs Immature Granulocytes 06/05/2019 0.16* 0.00 - 0.07 K/uL Final   Performed at [facility], [address], [place], [place] [address]  . Sodium 06/05/2019 134* 135 - 145 mmol/L Final  . Potassium 06/05/2019 4.3  3.5 - 5.1 mmol/L Final  . Chloride 06/05/2019 101  98 - 111 mmol/L Final  . CO2 06/05/2019 27  22 - 32 mmol/L Final  . Glucose, Bld 06/05/2019 225* 70 - 99 mg/dL Final  . BUN 06/05/2019 24* 8 - 23 mg/dL Final  . Creatinine, Ser 06/05/2019 0.76  0.61 - 1.24 mg/dL Final  . Calcium 06/05/2019 8.6* 8.9 - 10.3 mg/dL Final  . Total Protein 06/05/2019 7.1  6.5 - 8.1 g/dL Final  . Albumin 06/05/2019 4.2  3.5 - 5.0 g/dL Final  . AST 06/05/2019 20  15 - 41 U/L Final  . ALT 06/05/2019 15  0 - 44 U/L Final  . Alkaline Phosphatase 06/05/2019 32* 38 - 126 U/L Final  . Total Bilirubin 06/05/2019 0.5  0.3 - 1.2 mg/dL Final  . GFR calc non Af Amer 06/05/2019 >60  >  60 mL/min Final  . GFR calc Af Amer 06/05/2019 >60  >60 mL/min Final  . Anion gap 06/05/2019 6  5 - 15 Final   Performed at [facility], [address] [place],  [place] [address]    Assessment:  Paul Pham. is a 85 y.o. 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 Bone marrowon 09/22/2017 revealed a hypercellular for age with dyspoietic changes variably involving myeloid cell lines, but with main involvement of the granulocytic/monocytic cell line. This was associated with bone marrow monocytosis and borderline number to slight increase in blastic cells. The overall changes favor a primary myeloid neoplasm, particularly a myelodysplastic syndrome especially refractory anemia with excess blasts (RAEB-1) or possibly refractory cytopenia with multilineage dysplasia. Consideration was also given to an evolving myelodysplastic/myeloproliferative neoplasm such as chronic myelomonocytic leukemia but the lack of absolute peripheral monocytosis precluded such a diagnosis at this time. Flow cytometrywas negative. Cytogeneticswere normal (46, XY). Foundation one was positive for ASXL1 [id number], EZH2 Splice site [id number], RUNX1 [id number]. IPSS-R4.5, intermediate group.  Anemia work-up on 08/17/2017: Vitamin B12 (658), folate (22.0), and TSH (2.688). Retic was 2.6%. Haptoglobin was 116 on 08/19/2017. Epo levelwas 104.8 on 10/05/2017.  Ferritin has been followed: 106 on 08/17/2017, 131 on 11/04/2017, 144 on 05/30/2018, 394 on 09/01/2018, 441 on 11/28/2018, 394 on 01/02/2019, 480 on 02/06/2019, 392 on 03/13/2019, 527 on 04/17/2019, 533 on 05/29/2019, and 476 on 06/05/2019. Iron saturation was 34% on 08/17/2017 and 89% on 09/01/2018.  Bone marrowon 09/22/2017 revealed a hypercellular for age with dyspoietic changes variably involving myeloid cell lines, but with main involvement of the granulocytic/monocytic cell line. This was associated with bone marrow monocytosis and borderline number to slight increase in blasts (5%). The overall changes favor a primary myeloid neoplasm, particularly a myelodysplastic syndrome especially refractory anemia with excess blasts (RAEB-1) or possibly refractory  cytopenia with multilineage dysplasia. Consideration was also given to an evolving myelodysplastic/myeloproliferative neoplasm such as chronic myelomonocytic leukemia but the lack of absolute peripheral monocytosis precluded such a diagnosis at this time. Flow cytometrywas negative. Cytogeneticswere normal (46, XY). Foundation One was positive for ASXL1 [id number], EZH2 Splice site [id number], RUNX1 [id number].  He has received 16cycles ofVidaza(11/04/2017 - 08/22/2018; 10/24/2018- 04/24/2019). He has received Aranesp(initially 150 mcg every 2 weeks post chemotherapy then 300 mcg every 1-2 weeks after cycle #3; last 09/19/2018). He last received Aranesp on 05/29/2019. He has received 1-2 units of PRBCswith each cycle (last 05/04/2019).He has received19units of PRBCs since 08/17/2017.  Bone marrowon 08/12/2018 revealed a hypercellular marrow with dyspoietic changes. There was a borderline number of blasts (5%) seen by morphology. The findings were similar to previous biopsy although the cellularity is slightly higher in the current material.consistent with previously known myelodysplastic syndrome. Flow cytometry was negative. Cytogenetics were normal (46, XY).  Symptomatically, he is doing well.  Exam is unremarkable.    Plan: 1.   Labs today:CBC with diff, CMP, ferritin, hold tube. 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    He is tolerating Vidaza without side effect.    Treatment goal remains to decrease transfusion dependence and prevent acceleration of disease (blasts).  Review counts today.  WBC 4600 with an ANC of 2500. Begin cycle #17 Vidaza day 1-5 today. He last received Aranesp on 05/29/2019.             Continue Aranesp every 2 weeks to decrease transfusion dependence. 3. Iron overload Ferritin 476 today.  He has received 19 units of PRBCs to date (08/17/2017  - 05/04/2019). Anticipate anticipate initiation of an oral chelating agent if ferritin > 1000. 4.   Begin Vidaza day 1-5 this week. 5.   Labs on 06/08/2019 (Thursday)- CBC and hold tube for possible transfusion on 10/09. 6.   RTC in 1 weeks for labs (CBC with diff, hold tube), +/- Aranesp. 7.   RTC in 3 weeks for labs (CBC with diff, hold tube), +/- Aranesp. 8.   RTC in5weeks for labs (CBC with diff, hold tube), +/- Aranesp. 9.   RTC in6weeks for MD assessment, labs (CBC with diff, CMP, ferritin, hold tube) and cycle #18 Vidaza (day 1-5).  I discussed the assessment and treatment plan with the patient.  The patient was provided an opportunity to ask questions and all were answered.  The patient agreed with the plan and demonstrated an understanding of the instructions.  The patient was advised to call back if the symptoms worsen or if the condition fails to improve as anticipated.   Lequita Asal, MD, PhD    06/05/2019, 1:33 PM  I, Samul Dada, am acting as a scribe for Lequita Asal, MD.  I, [place] Mike Gip, MD, have reviewed the above documentation for accuracy and completeness, and I agree with the above.

## 2019-06-05 ENCOUNTER — Encounter: Payer: Self-pay | Admitting: Hematology and Oncology

## 2019-06-05 ENCOUNTER — Inpatient Hospital Stay: Payer: Medicare Other | Attending: Hematology and Oncology | Admitting: Hematology and Oncology

## 2019-06-05 ENCOUNTER — Inpatient Hospital Stay: Payer: Medicare Other

## 2019-06-05 ENCOUNTER — Other Ambulatory Visit: Payer: Self-pay

## 2019-06-05 VITALS — BP 137/71 | HR 72 | Temp 97.6°F | Resp 18 | Ht 73.0 in | Wt 177.2 lb

## 2019-06-05 DIAGNOSIS — D469 Myelodysplastic syndrome, unspecified: Secondary | ICD-10-CM

## 2019-06-05 DIAGNOSIS — Z5111 Encounter for antineoplastic chemotherapy: Secondary | ICD-10-CM | POA: Diagnosis not present

## 2019-06-05 DIAGNOSIS — D4621 Refractory anemia with excess of blasts 1: Secondary | ICD-10-CM | POA: Diagnosis present

## 2019-06-05 LAB — COMPREHENSIVE METABOLIC PANEL
ALT: 15 U/L (ref 0–44)
AST: 20 U/L (ref 15–41)
Albumin: 4.2 g/dL (ref 3.5–5.0)
Alkaline Phosphatase: 32 U/L — ABNORMAL LOW (ref 38–126)
Anion gap: 6 (ref 5–15)
BUN: 24 mg/dL — ABNORMAL HIGH (ref 8–23)
CO2: 27 mmol/L (ref 22–32)
Calcium: 8.6 mg/dL — ABNORMAL LOW (ref 8.9–10.3)
Chloride: 101 mmol/L (ref 98–111)
Creatinine, Ser: 0.76 mg/dL (ref 0.61–1.24)
GFR calc Af Amer: >60 mL/min (ref >60)
GFR calc non Af Amer: >60 mL/min (ref >60)
Glucose, Bld: 225 mg/dL — ABNORMAL HIGH (ref 70–99)
Potassium: 4.3 mmol/L (ref 3.5–5.1)
Sodium: 134 mmol/L — ABNORMAL LOW (ref 135–145)
Total Bilirubin: 0.5 mg/dL (ref 0.3–1.2)
Total Protein: 7.1 g/dL (ref 6.5–8.1)

## 2019-06-05 LAB — CBC WITH DIFFERENTIAL/PLATELET
Abs Immature Granulocytes: 0.16 10*3/uL — ABNORMAL HIGH (ref 0.00–0.07)
Basophils Absolute: 0 10*3/uL (ref 0.0–0.1)
Basophils Relative: 0 %
Eosinophils Absolute: 0 10*3/uL (ref 0.0–0.5)
Eosinophils Relative: 1 %
HCT: 25.6 % — ABNORMAL LOW (ref 39.0–52.0)
Hemoglobin: 7.4 g/dL — ABNORMAL LOW (ref 13.0–17.0)
Immature Granulocytes: 4 %
Lymphocytes Relative: 34 %
Lymphs Abs: 1.3 10*3/uL (ref 0.7–4.0)
MCH: 26.4 pg (ref 26.0–34.0)
MCHC: 28.9 g/dL — ABNORMAL LOW (ref 30.0–36.0)
MCV: 91.4 fL (ref 80.0–100.0)
Monocytes Absolute: 0.5 10*3/uL (ref 0.1–1.0)
Monocytes Relative: 12 %
Neutro Abs: 1.9 10*3/uL (ref 1.7–7.7)
Neutrophils Relative %: 49 %
Platelets: 306 10*3/uL (ref 150–400)
RBC: 2.8 MIL/uL — ABNORMAL LOW (ref 4.22–5.81)
RDW: 18.5 % — ABNORMAL HIGH (ref 11.5–15.5)
WBC: 3.8 10*3/uL — ABNORMAL LOW (ref 4.0–10.5)
nRBC: 0.5 % — ABNORMAL HIGH (ref 0.0–0.2)

## 2019-06-05 LAB — SAMPLE TO BLOOD BANK

## 2019-06-05 LAB — FERRITIN: Ferritin: 476 ng/mL — ABNORMAL HIGH (ref 24–336)

## 2019-06-05 MED ORDER — AZACITIDINE CHEMO SQ INJECTION
75.0000 mg/m2 | Freq: Once | INTRAMUSCULAR | Status: AC
  Administered 2019-06-05: 155 mg via SUBCUTANEOUS
  Filled 2019-06-05: qty 6.2

## 2019-06-05 MED ORDER — ONDANSETRON HCL 4 MG PO TABS: 8.0000 mg | ORAL_TABLET | Freq: Once | ORAL | Status: DC

## 2019-06-05 NOTE — Progress Notes (Signed)
No new changes noted today 

## 2019-06-05 NOTE — Patient Instructions (Signed)
Azacitidine suspension for injection (subcutaneous use) What is this medicine? AZACITIDINE (ay za SITE i deen) is a chemotherapy drug. This medicine reduces the growth of cancer cells and can suppress the immune system. It is used for treating myelodysplastic syndrome or some types of leukemia. This medicine may be used for other purposes; ask your health care provider or pharmacist if you have questions. COMMON BRAND NAME(S): Vidaza What should I tell my health care provider before I take this medicine? They need to know if you have any of these conditions:  kidney disease  liver disease  liver tumors  an unusual or allergic reaction to azacitidine, mannitol, other medicines, foods, dyes, or preservatives  pregnant or trying to get pregnant  breast-feeding How should I use this medicine? This medicine is for injection under the skin. It is administered in a hospital or clinic by a specially trained health care professional. Talk to your pediatrician regarding the use of this medicine in children. While this drug may be prescribed for selected conditions, precautions do apply. Overdosage: If you think you have taken too much of this medicine contact a poison control center or emergency room at once. NOTE: This medicine is only for you. Do not share this medicine with others. What if I miss a dose? It is important not to miss your dose. Call your doctor or health care professional if you are unable to keep an appointment. What may interact with this medicine? Interactions have not been studied. Give your health care provider a list of all the medicines, herbs, non-prescription drugs, or dietary supplements you use. Also tell them if you smoke, drink alcohol, or use illegal drugs. Some items may interact with your medicine. This list may not describe all possible interactions. Give your health care provider a list of all the medicines, herbs, non-prescription drugs, or dietary supplements  you use. Also tell them if you smoke, drink alcohol, or use illegal drugs. Some items may interact with your medicine. What should I watch for while using this medicine? Visit your doctor for checks on your progress. This drug may make you feel generally unwell. This is not uncommon, as chemotherapy can affect healthy cells as well as cancer cells. Report any side effects. Continue your course of treatment even though you feel ill unless your doctor tells you to stop. In some cases, you may be given additional medicines to help with side effects. Follow all directions for their use. Call your doctor or health care professional for advice if you get a fever, chills or sore throat, or other symptoms of a cold or flu. Do not treat yourself. This drug decreases your body's ability to fight infections. Try to avoid being around people who are sick. This medicine may increase your risk to bruise or bleed. Call your doctor or health care professional if you notice any unusual bleeding. You may need blood work done while you are taking this medicine. Do not become pregnant while taking this medicine and for 6 months after the last dose. Women should inform their doctor if they wish to become pregnant or think they might be pregnant. Men should not father a child while taking this medicine and for 3 months after the last dose. There is a potential for serious side effects to an unborn child. Talk to your health care professional or pharmacist for more information. Do not breast-feed an infant while taking this medicine and for 1 week after the last dose. This medicine may interfere with   the ability to have a child. Talk with your doctor or health care professional if you are concerned about your fertility. What side effects may I notice from receiving this medicine? Side effects that you should report to your doctor or health care professional as soon as possible:  allergic reactions like skin rash, itching or  hives, swelling of the face, lips, or tongue  low blood counts - this medicine may decrease the number of white blood cells, red blood cells and platelets. You may be at increased risk for infections and bleeding.  signs of infection - fever or chills, cough, sore throat, pain passing urine  signs of decreased platelets or bleeding - bruising, pinpoint red spots on the skin, black, tarry stools, blood in the urine  signs of decreased red blood cells - unusually weak or tired, fainting spells, lightheadedness  signs and symptoms of kidney injury like trouble passing urine or change in the amount of urine  signs and symptoms of liver injury like dark yellow or brown urine; general ill feeling or flu-like symptoms; light-colored stools; loss of appetite; nausea; right upper belly pain; unusually weak or tired; yellowing of the eyes or skin Side effects that usually do not require medical attention (report to your doctor or health care professional if they continue or are bothersome):  constipation  diarrhea  nausea, vomiting  pain or redness at the injection site  unusually weak or tired This list may not describe all possible side effects. Call your doctor for medical advice about side effects. You may report side effects to FDA at 1-800-FDA-1088. Where should I keep my medicine? This drug is given in a hospital or clinic and will not be stored at home. NOTE: This sheet is a summary. It may not cover all possible information. If you have questions about this medicine, talk to your doctor, pharmacist, or health care provider.  2020 Elsevier/Gold Standard (2016-09-15 14:37:51)  

## 2019-06-06 ENCOUNTER — Inpatient Hospital Stay: Payer: Medicare Other

## 2019-06-06 VITALS — BP 153/56 | HR 82 | Temp 99.1°F | Resp 18

## 2019-06-06 DIAGNOSIS — Z5111 Encounter for antineoplastic chemotherapy: Secondary | ICD-10-CM | POA: Diagnosis not present

## 2019-06-06 DIAGNOSIS — D469 Myelodysplastic syndrome, unspecified: Secondary | ICD-10-CM

## 2019-06-06 MED ORDER — AZACITIDINE CHEMO SQ INJECTION
75.0000 mg/m2 | Freq: Once | INTRAMUSCULAR | Status: AC
  Administered 2019-06-06: 155 mg via SUBCUTANEOUS
  Filled 2019-06-06: qty 6.2

## 2019-06-06 MED ORDER — ONDANSETRON HCL 4 MG PO TABS: 8.0000 mg | ORAL_TABLET | Freq: Once | ORAL | Status: DC

## 2019-06-06 NOTE — Patient Instructions (Signed)
Azacitidine suspension for injection (subcutaneous use) What is this medicine? AZACITIDINE (ay za SITE i deen) is a chemotherapy drug. This medicine reduces the growth of cancer cells and can suppress the immune system. It is used for treating myelodysplastic syndrome or some types of leukemia. This medicine may be used for other purposes; ask your health care provider or pharmacist if you have questions. COMMON BRAND NAME(S): Vidaza What should I tell my health care provider before I take this medicine? They need to know if you have any of these conditions:  kidney disease  liver disease  liver tumors  an unusual or allergic reaction to azacitidine, mannitol, other medicines, foods, dyes, or preservatives  pregnant or trying to get pregnant  breast-feeding How should I use this medicine? This medicine is for injection under the skin. It is administered in a hospital or clinic by a specially trained health care professional. Talk to your pediatrician regarding the use of this medicine in children. While this drug may be prescribed for selected conditions, precautions do apply. Overdosage: If you think you have taken too much of this medicine contact a poison control center or emergency room at once. NOTE: This medicine is only for you. Do not share this medicine with others. What if I miss a dose? It is important not to miss your dose. Call your doctor or health care professional if you are unable to keep an appointment. What may interact with this medicine? Interactions have not been studied. Give your health care provider a list of all the medicines, herbs, non-prescription drugs, or dietary supplements you use. Also tell them if you smoke, drink alcohol, or use illegal drugs. Some items may interact with your medicine. This list may not describe all possible interactions. Give your health care provider a list of all the medicines, herbs, non-prescription drugs, or dietary supplements  you use. Also tell them if you smoke, drink alcohol, or use illegal drugs. Some items may interact with your medicine. What should I watch for while using this medicine? Visit your doctor for checks on your progress. This drug may make you feel generally unwell. This is not uncommon, as chemotherapy can affect healthy cells as well as cancer cells. Report any side effects. Continue your course of treatment even though you feel ill unless your doctor tells you to stop. In some cases, you may be given additional medicines to help with side effects. Follow all directions for their use. Call your doctor or health care professional for advice if you get a fever, chills or sore throat, or other symptoms of a cold or flu. Do not treat yourself. This drug decreases your body's ability to fight infections. Try to avoid being around people who are sick. This medicine may increase your risk to bruise or bleed. Call your doctor or health care professional if you notice any unusual bleeding. You may need blood work done while you are taking this medicine. Do not become pregnant while taking this medicine and for 6 months after the last dose. Women should inform their doctor if they wish to become pregnant or think they might be pregnant. Men should not father a child while taking this medicine and for 3 months after the last dose. There is a potential for serious side effects to an unborn child. Talk to your health care professional or pharmacist for more information. Do not breast-feed an infant while taking this medicine and for 1 week after the last dose. This medicine may interfere with   the ability to have a child. Talk with your doctor or health care professional if you are concerned about your fertility. What side effects may I notice from receiving this medicine? Side effects that you should report to your doctor or health care professional as soon as possible:  allergic reactions like skin rash, itching or  hives, swelling of the face, lips, or tongue  low blood counts - this medicine may decrease the number of white blood cells, red blood cells and platelets. You may be at increased risk for infections and bleeding.  signs of infection - fever or chills, cough, sore throat, pain passing urine  signs of decreased platelets or bleeding - bruising, pinpoint red spots on the skin, black, tarry stools, blood in the urine  signs of decreased red blood cells - unusually weak or tired, fainting spells, lightheadedness  signs and symptoms of kidney injury like trouble passing urine or change in the amount of urine  signs and symptoms of liver injury like dark yellow or brown urine; general ill feeling or flu-like symptoms; light-colored stools; loss of appetite; nausea; right upper belly pain; unusually weak or tired; yellowing of the eyes or skin Side effects that usually do not require medical attention (report to your doctor or health care professional if they continue or are bothersome):  constipation  diarrhea  nausea, vomiting  pain or redness at the injection site  unusually weak or tired This list may not describe all possible side effects. Call your doctor for medical advice about side effects. You may report side effects to FDA at 1-800-FDA-1088. Where should I keep my medicine? This drug is given in a hospital or clinic and will not be stored at home. NOTE: This sheet is a summary. It may not cover all possible information. If you have questions about this medicine, talk to your doctor, pharmacist, or health care provider.  2020 Elsevier/Gold Standard (2016-09-15 14:37:51)  

## 2019-06-07 ENCOUNTER — Inpatient Hospital Stay: Payer: Medicare Other

## 2019-06-07 ENCOUNTER — Other Ambulatory Visit: Payer: Self-pay

## 2019-06-07 VITALS — BP 139/63 | HR 77 | Temp 97.2°F | Resp 16

## 2019-06-07 DIAGNOSIS — D469 Myelodysplastic syndrome, unspecified: Secondary | ICD-10-CM

## 2019-06-07 DIAGNOSIS — Z5111 Encounter for antineoplastic chemotherapy: Secondary | ICD-10-CM | POA: Diagnosis not present

## 2019-06-07 MED ORDER — AZACITIDINE CHEMO SQ INJECTION
75.0000 mg/m2 | Freq: Once | INTRAMUSCULAR | Status: AC
  Administered 2019-06-07: 155 mg via SUBCUTANEOUS
  Filled 2019-06-07: qty 6.2

## 2019-06-07 MED ORDER — ONDANSETRON HCL 4 MG PO TABS: 8.0000 mg | ORAL_TABLET | Freq: Once | ORAL | Status: DC

## 2019-06-07 NOTE — Patient Instructions (Signed)
Azacitidine suspension for injection (subcutaneous use) What is this medicine? AZACITIDINE (ay za SITE i deen) is a chemotherapy drug. This medicine reduces the growth of cancer cells and can suppress the immune system. It is used for treating myelodysplastic syndrome or some types of leukemia. This medicine may be used for other purposes; ask your health care provider or pharmacist if you have questions. COMMON BRAND NAME(S): Vidaza What should I tell my health care provider before I take this medicine? They need to know if you have any of these conditions:  kidney disease  liver disease  liver tumors  an unusual or allergic reaction to azacitidine, mannitol, other medicines, foods, dyes, or preservatives  pregnant or trying to get pregnant  breast-feeding How should I use this medicine? This medicine is for injection under the skin. It is administered in a hospital or clinic by a specially trained health care professional. Talk to your pediatrician regarding the use of this medicine in children. While this drug may be prescribed for selected conditions, precautions do apply. Overdosage: If you think you have taken too much of this medicine contact a poison control center or emergency room at once. NOTE: This medicine is only for you. Do not share this medicine with others. What if I miss a dose? It is important not to miss your dose. Call your doctor or health care professional if you are unable to keep an appointment. What may interact with this medicine? Interactions have not been studied. Give your health care provider a list of all the medicines, herbs, non-prescription drugs, or dietary supplements you use. Also tell them if you smoke, drink alcohol, or use illegal drugs. Some items may interact with your medicine. This list may not describe all possible interactions. Give your health care provider a list of all the medicines, herbs, non-prescription drugs, or dietary supplements  you use. Also tell them if you smoke, drink alcohol, or use illegal drugs. Some items may interact with your medicine. What should I watch for while using this medicine? Visit your doctor for checks on your progress. This drug may make you feel generally unwell. This is not uncommon, as chemotherapy can affect healthy cells as well as cancer cells. Report any side effects. Continue your course of treatment even though you feel ill unless your doctor tells you to stop. In some cases, you may be given additional medicines to help with side effects. Follow all directions for their use. Call your doctor or health care professional for advice if you get a fever, chills or sore throat, or other symptoms of a cold or flu. Do not treat yourself. This drug decreases your body's ability to fight infections. Try to avoid being around people who are sick. This medicine may increase your risk to bruise or bleed. Call your doctor or health care professional if you notice any unusual bleeding. You may need blood work done while you are taking this medicine. Do not become pregnant while taking this medicine and for 6 months after the last dose. Women should inform their doctor if they wish to become pregnant or think they might be pregnant. Men should not father a child while taking this medicine and for 3 months after the last dose. There is a potential for serious side effects to an unborn child. Talk to your health care professional or pharmacist for more information. Do not breast-feed an infant while taking this medicine and for 1 week after the last dose. This medicine may interfere with   the ability to have a child. Talk with your doctor or health care professional if you are concerned about your fertility. What side effects may I notice from receiving this medicine? Side effects that you should report to your doctor or health care professional as soon as possible:  allergic reactions like skin rash, itching or  hives, swelling of the face, lips, or tongue  low blood counts - this medicine may decrease the number of white blood cells, red blood cells and platelets. You may be at increased risk for infections and bleeding.  signs of infection - fever or chills, cough, sore throat, pain passing urine  signs of decreased platelets or bleeding - bruising, pinpoint red spots on the skin, black, tarry stools, blood in the urine  signs of decreased red blood cells - unusually weak or tired, fainting spells, lightheadedness  signs and symptoms of kidney injury like trouble passing urine or change in the amount of urine  signs and symptoms of liver injury like dark yellow or brown urine; general ill feeling or flu-like symptoms; light-colored stools; loss of appetite; nausea; right upper belly pain; unusually weak or tired; yellowing of the eyes or skin Side effects that usually do not require medical attention (report to your doctor or health care professional if they continue or are bothersome):  constipation  diarrhea  nausea, vomiting  pain or redness at the injection site  unusually weak or tired This list may not describe all possible side effects. Call your doctor for medical advice about side effects. You may report side effects to FDA at 1-800-FDA-1088. Where should I keep my medicine? This drug is given in a hospital or clinic and will not be stored at home. NOTE: This sheet is a summary. It may not cover all possible information. If you have questions about this medicine, talk to your doctor, pharmacist, or health care provider.  2020 Elsevier/Gold Standard (2016-09-15 14:37:51)  

## 2019-06-08 ENCOUNTER — Inpatient Hospital Stay: Payer: Medicare Other

## 2019-06-08 ENCOUNTER — Other Ambulatory Visit: Payer: Self-pay | Admitting: Hematology and Oncology

## 2019-06-08 ENCOUNTER — Other Ambulatory Visit: Payer: Self-pay

## 2019-06-08 VITALS — BP 139/57 | HR 89 | Temp 97.9°F | Resp 16

## 2019-06-08 DIAGNOSIS — D469 Myelodysplastic syndrome, unspecified: Secondary | ICD-10-CM

## 2019-06-08 DIAGNOSIS — D649 Anemia, unspecified: Secondary | ICD-10-CM

## 2019-06-08 DIAGNOSIS — Z5111 Encounter for antineoplastic chemotherapy: Secondary | ICD-10-CM | POA: Diagnosis not present

## 2019-06-08 LAB — CBC WITH DIFFERENTIAL/PLATELET
Abs Immature Granulocytes: 0.04 10*3/uL (ref 0.00–0.07)
Basophils Absolute: 0 10*3/uL (ref 0.0–0.1)
Basophils Relative: 0 %
Eosinophils Absolute: 0 10*3/uL (ref 0.0–0.5)
Eosinophils Relative: 0 %
HCT: 24.2 % — ABNORMAL LOW (ref 39.0–52.0)
Hemoglobin: 6.9 g/dL — ABNORMAL LOW (ref 13.0–17.0)
Immature Granulocytes: 1 %
Lymphocytes Relative: 27 %
Lymphs Abs: 1.3 10*3/uL (ref 0.7–4.0)
MCH: 25.9 pg — ABNORMAL LOW (ref 26.0–34.0)
MCHC: 28.5 g/dL — ABNORMAL LOW (ref 30.0–36.0)
MCV: 91 fL (ref 80.0–100.0)
Monocytes Absolute: 0.8 10*3/uL (ref 0.1–1.0)
Monocytes Relative: 18 %
Neutro Abs: 2.5 10*3/uL (ref 1.7–7.7)
Neutrophils Relative %: 54 %
Platelets: 239 10*3/uL (ref 150–400)
RBC: 2.66 MIL/uL — ABNORMAL LOW (ref 4.22–5.81)
RDW: 18.3 % — ABNORMAL HIGH (ref 11.5–15.5)
WBC: 4.6 10*3/uL (ref 4.0–10.5)
nRBC: 0.6 % — ABNORMAL HIGH (ref 0.0–0.2)

## 2019-06-08 LAB — PREPARE RBC (CROSSMATCH)

## 2019-06-08 MED ORDER — AZACITIDINE CHEMO SQ INJECTION
75.0000 mg/m2 | Freq: Once | INTRAMUSCULAR | Status: AC
  Administered 2019-06-08: 155 mg via SUBCUTANEOUS
  Filled 2019-06-08: qty 6.2

## 2019-06-08 NOTE — Patient Instructions (Signed)
Azacitidine suspension for injection (subcutaneous use) What is this medicine? AZACITIDINE (ay za SITE i deen) is a chemotherapy drug. This medicine reduces the growth of cancer cells and can suppress the immune system. It is used for treating myelodysplastic syndrome or some types of leukemia. This medicine may be used for other purposes; ask your health care provider or pharmacist if you have questions. COMMON BRAND NAME(S): Vidaza What should I tell my health care provider before I take this medicine? They need to know if you have any of these conditions:  kidney disease  liver disease  liver tumors  an unusual or allergic reaction to azacitidine, mannitol, other medicines, foods, dyes, or preservatives  pregnant or trying to get pregnant  breast-feeding How should I use this medicine? This medicine is for injection under the skin. It is administered in a hospital or clinic by a specially trained health care professional. Talk to your pediatrician regarding the use of this medicine in children. While this drug may be prescribed for selected conditions, precautions do apply. Overdosage: If you think you have taken too much of this medicine contact a poison control center or emergency room at once. NOTE: This medicine is only for you. Do not share this medicine with others. What if I miss a dose? It is important not to miss your dose. Call your doctor or health care professional if you are unable to keep an appointment. What may interact with this medicine? Interactions have not been studied. Give your health care provider a list of all the medicines, herbs, non-prescription drugs, or dietary supplements you use. Also tell them if you smoke, drink alcohol, or use illegal drugs. Some items may interact with your medicine. This list may not describe all possible interactions. Give your health care provider a list of all the medicines, herbs, non-prescription drugs, or dietary supplements  you use. Also tell them if you smoke, drink alcohol, or use illegal drugs. Some items may interact with your medicine. What should I watch for while using this medicine? Visit your doctor for checks on your progress. This drug may make you feel generally unwell. This is not uncommon, as chemotherapy can affect healthy cells as well as cancer cells. Report any side effects. Continue your course of treatment even though you feel ill unless your doctor tells you to stop. In some cases, you may be given additional medicines to help with side effects. Follow all directions for their use. Call your doctor or health care professional for advice if you get a fever, chills or sore throat, or other symptoms of a cold or flu. Do not treat yourself. This drug decreases your body's ability to fight infections. Try to avoid being around people who are sick. This medicine may increase your risk to bruise or bleed. Call your doctor or health care professional if you notice any unusual bleeding. You may need blood work done while you are taking this medicine. Do not become pregnant while taking this medicine and for 6 months after the last dose. Women should inform their doctor if they wish to become pregnant or think they might be pregnant. Men should not father a child while taking this medicine and for 3 months after the last dose. There is a potential for serious side effects to an unborn child. Talk to your health care professional or pharmacist for more information. Do not breast-feed an infant while taking this medicine and for 1 week after the last dose. This medicine may interfere with   the ability to have a child. Talk with your doctor or health care professional if you are concerned about your fertility. What side effects may I notice from receiving this medicine? Side effects that you should report to your doctor or health care professional as soon as possible:  allergic reactions like skin rash, itching or  hives, swelling of the face, lips, or tongue  low blood counts - this medicine may decrease the number of white blood cells, red blood cells and platelets. You may be at increased risk for infections and bleeding.  signs of infection - fever or chills, cough, sore throat, pain passing urine  signs of decreased platelets or bleeding - bruising, pinpoint red spots on the skin, black, tarry stools, blood in the urine  signs of decreased red blood cells - unusually weak or tired, fainting spells, lightheadedness  signs and symptoms of kidney injury like trouble passing urine or change in the amount of urine  signs and symptoms of liver injury like dark yellow or brown urine; general ill feeling or flu-like symptoms; light-colored stools; loss of appetite; nausea; right upper belly pain; unusually weak or tired; yellowing of the eyes or skin Side effects that usually do not require medical attention (report to your doctor or health care professional if they continue or are bothersome):  constipation  diarrhea  nausea, vomiting  pain or redness at the injection site  unusually weak or tired This list may not describe all possible side effects. Call your doctor for medical advice about side effects. You may report side effects to FDA at 1-800-FDA-1088. Where should I keep my medicine? This drug is given in a hospital or clinic and will not be stored at home. NOTE: This sheet is a summary. It may not cover all possible information. If you have questions about this medicine, talk to your doctor, pharmacist, or health care provider.  2020 Elsevier/Gold Standard (2016-09-15 14:37:51)  

## 2019-06-09 ENCOUNTER — Inpatient Hospital Stay: Payer: Medicare Other

## 2019-06-09 ENCOUNTER — Other Ambulatory Visit: Payer: Self-pay

## 2019-06-09 ENCOUNTER — Telehealth: Payer: Self-pay | Admitting: *Deleted

## 2019-06-09 VITALS — BP 140/53 | HR 72 | Temp 96.6°F | Resp 18

## 2019-06-09 DIAGNOSIS — Z5111 Encounter for antineoplastic chemotherapy: Secondary | ICD-10-CM | POA: Diagnosis not present

## 2019-06-09 DIAGNOSIS — D649 Anemia, unspecified: Secondary | ICD-10-CM

## 2019-06-09 DIAGNOSIS — D469 Myelodysplastic syndrome, unspecified: Secondary | ICD-10-CM

## 2019-06-09 MED ORDER — SODIUM CHLORIDE 0.9% IV SOLUTION
250.0000 mL | Freq: Once | INTRAVENOUS | Status: DC
  Filled 2019-06-09: qty 250

## 2019-06-09 MED ORDER — AZACITIDINE CHEMO SQ INJECTION
75.0000 mg/m2 | Freq: Once | INTRAMUSCULAR | Status: AC
  Administered 2019-06-09: 155 mg via SUBCUTANEOUS
  Filled 2019-06-09: qty 6.2

## 2019-06-09 MED ORDER — ACETAMINOPHEN 325 MG PO TABS: 650.0000 mg | ORAL_TABLET | Freq: Once | ORAL | Status: DC

## 2019-06-09 MED ORDER — ONDANSETRON HCL 4 MG PO TABS: 8.0000 mg | ORAL_TABLET | Freq: Once | ORAL | Status: DC

## 2019-06-09 MED ORDER — DIPHENHYDRAMINE HCL 25 MG PO CAPS: 25.0000 mg | ORAL_CAPSULE | Freq: Once | ORAL | Status: DC

## 2019-06-09 NOTE — Telephone Encounter (Signed)
Patient's wife called earlier and I returned her call.  Patient was to come in for blood transfusion this morning at 10:30.  Patient is dizzy and throwing up.  He has a history of vertigo per the wife and she has given him his medication.  Patient is unable to come in at the moment and will try to come before 1pm.  Patient's wife is to call if his condition worsens or if they will be unable to make it.  I explained that she would need to call 911 since his hemoglobin is under 7.0 and they would need to seek ED help if she is unable to get him to the clinic.  Patient's wife voiced understanding.

## 2019-06-09 NOTE — Progress Notes (Signed)
Patient able to make it in today for his PRBCs.  Vital signs stable.  Patient states that he already feels better after being here.

## 2019-06-09 NOTE — Patient Instructions (Signed)
Azacitidine suspension for injection (subcutaneous use) What is this medicine? AZACITIDINE (ay za SITE i deen) is a chemotherapy drug. This medicine reduces the growth of cancer cells and can suppress the immune system. It is used for treating myelodysplastic syndrome or some types of leukemia. This medicine may be used for other purposes; ask your health care provider or pharmacist if you have questions. COMMON BRAND NAME(S): Vidaza What should I tell my health care provider before I take this medicine? They need to know if you have any of these conditions:  kidney disease  liver disease  liver tumors  an unusual or allergic reaction to azacitidine, mannitol, other medicines, foods, dyes, or preservatives  pregnant or trying to get pregnant  breast-feeding How should I use this medicine? This medicine is for injection under the skin. It is administered in a hospital or clinic by a specially trained health care professional. Talk to your pediatrician regarding the use of this medicine in children. While this drug may be prescribed for selected conditions, precautions do apply. Overdosage: If you think you have taken too much of this medicine contact a poison control center or emergency room at once. NOTE: This medicine is only for you. Do not share this medicine with others. What if I miss a dose? It is important not to miss your dose. Call your doctor or health care professional if you are unable to keep an appointment. What may interact with this medicine? Interactions have not been studied. Give your health care provider a list of all the medicines, herbs, non-prescription drugs, or dietary supplements you use. Also tell them if you smoke, drink alcohol, or use illegal drugs. Some items may interact with your medicine. This list may not describe all possible interactions. Give your health care provider a list of all the medicines, herbs, non-prescription drugs, or dietary supplements  you use. Also tell them if you smoke, drink alcohol, or use illegal drugs. Some items may interact with your medicine. What should I watch for while using this medicine? Visit your doctor for checks on your progress. This drug may make you feel generally unwell. This is not uncommon, as chemotherapy can affect healthy cells as well as cancer cells. Report any side effects. Continue your course of treatment even though you feel ill unless your doctor tells you to stop. In some cases, you may be given additional medicines to help with side effects. Follow all directions for their use. Call your doctor or health care professional for advice if you get a fever, chills or sore throat, or other symptoms of a cold or flu. Do not treat yourself. This drug decreases your body's ability to fight infections. Try to avoid being around people who are sick. This medicine may increase your risk to bruise or bleed. Call your doctor or health care professional if you notice any unusual bleeding. You may need blood work done while you are taking this medicine. Do not become pregnant while taking this medicine and for 6 months after the last dose. Women should inform their doctor if they wish to become pregnant or think they might be pregnant. Men should not father a child while taking this medicine and for 3 months after the last dose. There is a potential for serious side effects to an unborn child. Talk to your health care professional or pharmacist for more information. Do not breast-feed an infant while taking this medicine and for 1 week after the last dose. This medicine may interfere with   the ability to have a child. Talk with your doctor or health care professional if you are concerned about your fertility. What side effects may I notice from receiving this medicine? Side effects that you should report to your doctor or health care professional as soon as possible:  allergic reactions like skin rash, itching or  hives, swelling of the face, lips, or tongue  low blood counts - this medicine may decrease the number of white blood cells, red blood cells and platelets. You may be at increased risk for infections and bleeding.  signs of infection - fever or chills, cough, sore throat, pain passing urine  signs of decreased platelets or bleeding - bruising, pinpoint red spots on the skin, black, tarry stools, blood in the urine  signs of decreased red blood cells - unusually weak or tired, fainting spells, lightheadedness  signs and symptoms of kidney injury like trouble passing urine or change in the amount of urine  signs and symptoms of liver injury like dark yellow or brown urine; general ill feeling or flu-like symptoms; light-colored stools; loss of appetite; nausea; right upper belly pain; unusually weak or tired; yellowing of the eyes or skin Side effects that usually do not require medical attention (report to your doctor or health care professional if they continue or are bothersome):  constipation  diarrhea  nausea, vomiting  pain or redness at the injection site  unusually weak or tired This list may not describe all possible side effects. Call your doctor for medical advice about side effects. You may report side effects to FDA at 1-800-FDA-1088. Where should I keep my medicine? This drug is given in a hospital or clinic and will not be stored at home. NOTE: This sheet is a summary. It may not cover all possible information. If you have questions about this medicine, talk to your doctor, pharmacist, or health care provider.  2020 Elsevier/Gold Standard (2016-09-15 14:37:51) Blood Transfusion, Adult, Care After This sheet gives you information about how to care for yourself after your procedure. Your doctor may also give you more specific instructions. If you have problems or questions, contact your doctor. Follow these instructions at home:   Take over-the-counter and prescription  medicines only as told by your doctor.  Go back to your normal activities as told by your doctor.  Follow instructions from your doctor about how to take care of the area where an IV tube was put into your vein (insertion site). Make sure you: ? Wash your hands with soap and water before you change your bandage (dressing). If there is no soap and water, use hand sanitizer. ? Change your bandage as told by your doctor.  Check your IV insertion site every day for signs of infection. Check for: ? More redness, swelling, or pain. ? More fluid or blood. ? Warmth. ? Pus or a bad smell. Contact a doctor if:  You have more redness, swelling, or pain around the IV insertion site.  You have more fluid or blood coming from the IV insertion site.  Your IV insertion site feels warm to the touch.  You have pus or a bad smell coming from the IV insertion site.  Your pee (urine) turns pink, red, or brown.  You feel weak after doing your normal activities. Get help right away if:  You have signs of a serious allergic or body defense (immune) system reaction, including: ? Itchiness. ? Hives. ? Trouble breathing. ? Anxiety. ? Pain in your chest or lower  back. ? Fever, flushing, and chills. ? Fast pulse. ? Rash. ? Watery poop (diarrhea). ? Throwing up (vomiting). ? Dark pee. ? Serious headache. ? Dizziness. ? Stiff neck. ? Yellow color in your face or the white parts of your eyes (jaundice). Summary  After a blood transfusion, return to your normal activities as told by your doctor.  Every day, check for signs of infection where the IV tube was put into your vein.  Some signs of infection are warm skin, more redness and pain, more fluid or blood, and pus or a bad smell where the needle went in.  Contact your doctor if you feel weak or have any unusual symptoms. This information is not intended to replace advice given to you by your health care provider. Make sure you discuss any  questions you have with your health care provider. Document Released: 09/07/2014 Document Revised: 12/22/2017 Document Reviewed: 04/10/2016 Elsevier Patient Education  2020 [employer].

## 2019-06-10 LAB — TYPE AND SCREEN
ABO/RH(D): B POS
Antibody Screen: NEGATIVE
Unit division: 0

## 2019-06-10 LAB — BPAM RBC
Blood Product Expiration Date: 202010242359
ISSUE DATE / TIME: 202010091256
Unit Type and Rh: 5100

## 2019-06-12 ENCOUNTER — Other Ambulatory Visit: Payer: Self-pay

## 2019-06-12 ENCOUNTER — Inpatient Hospital Stay: Payer: Medicare Other

## 2019-06-12 VITALS — BP 129/66 | HR 84 | Temp 99.0°F | Resp 16

## 2019-06-12 DIAGNOSIS — D469 Myelodysplastic syndrome, unspecified: Secondary | ICD-10-CM

## 2019-06-12 DIAGNOSIS — Z5111 Encounter for antineoplastic chemotherapy: Secondary | ICD-10-CM | POA: Diagnosis not present

## 2019-06-12 LAB — CBC WITH DIFFERENTIAL/PLATELET
Abs Immature Granulocytes: 0.12 10*3/uL — ABNORMAL HIGH (ref 0.00–0.07)
Basophils Absolute: 0 10*3/uL (ref 0.0–0.1)
Basophils Relative: 0 %
Eosinophils Absolute: 0 10*3/uL (ref 0.0–0.5)
Eosinophils Relative: 0 %
HCT: 26.3 % — ABNORMAL LOW (ref 39.0–52.0)
Hemoglobin: 7.8 g/dL — ABNORMAL LOW (ref 13.0–17.0)
Immature Granulocytes: 2 %
Lymphocytes Relative: 24 %
Lymphs Abs: 1.2 10*3/uL (ref 0.7–4.0)
MCH: 26.3 pg (ref 26.0–34.0)
MCHC: 29.7 g/dL — ABNORMAL LOW (ref 30.0–36.0)
MCV: 88.6 fL (ref 80.0–100.0)
Monocytes Absolute: 0.5 10*3/uL (ref 0.1–1.0)
Monocytes Relative: 11 %
Neutro Abs: 3.2 10*3/uL (ref 1.7–7.7)
Neutrophils Relative %: 63 %
Platelets: 203 10*3/uL (ref 150–400)
RBC: 2.97 MIL/uL — ABNORMAL LOW (ref 4.22–5.81)
RDW: 17.6 % — ABNORMAL HIGH (ref 11.5–15.5)
WBC: 5.1 10*3/uL (ref 4.0–10.5)
nRBC: 0.4 % — ABNORMAL HIGH (ref 0.0–0.2)

## 2019-06-12 LAB — SAMPLE TO BLOOD BANK

## 2019-06-12 MED ORDER — DARBEPOETIN ALFA 300 MCG/0.6ML IJ SOSY
300.0000 ug | PREFILLED_SYRINGE | Freq: Once | INTRAMUSCULAR | Status: AC
  Administered 2019-06-12: 14:00:00 300 ug via SUBCUTANEOUS

## 2019-06-12 NOTE — Patient Instructions (Signed)
Darbepoetin Alfa injection What is this medicine? DARBEPOETIN ALFA (dar be POE e tin AL fa) helps your body make more red blood cells. It is used to treat anemia caused by chronic kidney failure and chemotherapy. This medicine may be used for other purposes; ask your health care provider or pharmacist if you have questions. COMMON BRAND NAME(S): Aranesp What should I tell my health care provider before I take this medicine? They need to know if you have any of these conditions:  blood clotting disorders or history of blood clots  cancer patient not on chemotherapy  cystic fibrosis  heart disease, such as angina, heart failure, or a history of a heart attack  hemoglobin level of 12 g/dL or greater  high blood pressure  low levels of folate, iron, or vitamin B12  seizures  an unusual or allergic reaction to darbepoetin, erythropoietin, albumin, hamster proteins, latex, other medicines, foods, dyes, or preservatives  pregnant or trying to get pregnant  breast-feeding How should I use this medicine? This medicine is for injection into a vein or under the skin. It is usually given by a health care professional in a hospital or clinic setting. If you get this medicine at home, you will be taught how to prepare and give this medicine. Use exactly as directed. Take your medicine at regular intervals. Do not take your medicine more often than directed. It is important that you put your used needles and syringes in a special sharps container. Do not put them in a trash can. If you do not have a sharps container, call your pharmacist or healthcare provider to get one. A special MedGuide will be given to you by the pharmacist with each prescription and refill. Be sure to read this information carefully each time. Talk to your pediatrician regarding the use of this medicine in children. While this medicine may be used in children as young as 1 month of age for selected conditions, precautions do  apply. Overdosage: If you think you have taken too much of this medicine contact a poison control center or emergency room at once. NOTE: This medicine is only for you. Do not share this medicine with others. What if I miss a dose? If you miss a dose, take it as soon as you can. If it is almost time for your next dose, take only that dose. Do not take double or extra doses. What may interact with this medicine? Do not take this medicine with any of the following medications:  epoetin alfa This list may not describe all possible interactions. Give your health care provider a list of all the medicines, herbs, non-prescription drugs, or dietary supplements you use. Also tell them if you smoke, drink alcohol, or use illegal drugs. Some items may interact with your medicine. What should I watch for while using this medicine? Your condition will be monitored carefully while you are receiving this medicine. You may need blood work done while you are taking this medicine. This medicine may cause a decrease in vitamin B6. You should make sure that you get enough vitamin B6 while you are taking this medicine. Discuss the foods you eat and the vitamins you take with your health care professional. What side effects may I notice from receiving this medicine? Side effects that you should report to your doctor or health care professional as soon as possible:  allergic reactions like skin rash, itching or hives, swelling of the face, lips, or tongue  breathing problems  changes in   vision  chest pain  confusion, trouble speaking or understanding  feeling faint or lightheaded, falls  high blood pressure  muscle aches or pains  pain, swelling, warmth in the leg  rapid weight gain  severe headaches  sudden numbness or weakness of the face, arm or leg  trouble walking, dizziness, loss of balance or coordination  seizures (convulsions)  swelling of the ankles, feet, hands  unusually weak or  tired Side effects that usually do not require medical attention (report to your doctor or health care professional if they continue or are bothersome):  diarrhea  fever, chills (flu-like symptoms)  headaches  nausea, vomiting  redness, stinging, or swelling at site where injected This list may not describe all possible side effects. Call your doctor for medical advice about side effects. You may report side effects to FDA at 1-800-FDA-1088. Where should I keep my medicine? Keep out of the reach of children. Store in a refrigerator between 2 and 8 degrees C (36 and 46 degrees F). Do not freeze. Do not shake. Throw away any unused portion if using a single-dose vial. Throw away any unused medicine after the expiration date. NOTE: This sheet is a summary. It may not cover all possible information. If you have questions about this medicine, talk to your doctor, pharmacist, or health care provider.  2020 [employer] Standard (2017-09-01 16:44:20)  

## 2019-06-26 ENCOUNTER — Other Ambulatory Visit: Payer: Self-pay | Admitting: *Deleted

## 2019-06-26 ENCOUNTER — Other Ambulatory Visit: Payer: Self-pay

## 2019-06-26 ENCOUNTER — Inpatient Hospital Stay: Payer: Medicare Other

## 2019-06-26 ENCOUNTER — Other Ambulatory Visit: Payer: Self-pay | Admitting: Hematology and Oncology

## 2019-06-26 VITALS — BP 134/61 | HR 80 | Resp 16

## 2019-06-26 DIAGNOSIS — Z5111 Encounter for antineoplastic chemotherapy: Secondary | ICD-10-CM | POA: Diagnosis not present

## 2019-06-26 DIAGNOSIS — D469 Myelodysplastic syndrome, unspecified: Secondary | ICD-10-CM

## 2019-06-26 DIAGNOSIS — D649 Anemia, unspecified: Secondary | ICD-10-CM

## 2019-06-26 LAB — CBC WITH DIFFERENTIAL/PLATELET
Abs Immature Granulocytes: 0.05 10*3/uL (ref 0.00–0.07)
Basophils Absolute: 0 10*3/uL (ref 0.0–0.1)
Basophils Relative: 0 %
Eosinophils Absolute: 0 10*3/uL (ref 0.0–0.5)
Eosinophils Relative: 0 %
HCT: 22.6 % — ABNORMAL LOW (ref 39.0–52.0)
Hemoglobin: 6.7 g/dL — ABNORMAL LOW (ref 13.0–17.0)
Immature Granulocytes: 1 %
Lymphocytes Relative: 34 %
Lymphs Abs: 1.3 10*3/uL (ref 0.7–4.0)
MCH: 26.8 pg (ref 26.0–34.0)
MCHC: 29.6 g/dL — ABNORMAL LOW (ref 30.0–36.0)
MCV: 90.4 fL (ref 80.0–100.0)
Monocytes Absolute: 0.4 10*3/uL (ref 0.1–1.0)
Monocytes Relative: 10 %
Neutro Abs: 2.1 10*3/uL (ref 1.7–7.7)
Neutrophils Relative %: 55 %
Platelets: 147 10*3/uL — ABNORMAL LOW (ref 150–400)
RBC: 2.5 MIL/uL — ABNORMAL LOW (ref 4.22–5.81)
RDW: 19.1 % — ABNORMAL HIGH (ref 11.5–15.5)
WBC: 3.9 10*3/uL — ABNORMAL LOW (ref 4.0–10.5)
nRBC: 1 % — ABNORMAL HIGH (ref 0.0–0.2)

## 2019-06-26 MED ORDER — DARBEPOETIN ALFA 300 MCG/0.6ML IJ SOSY
300.0000 ug | PREFILLED_SYRINGE | Freq: Once | INTRAMUSCULAR | Status: AC
  Administered 2019-06-26: 300 ug via SUBCUTANEOUS

## 2019-06-26 NOTE — Patient Instructions (Signed)

## 2019-06-26 NOTE — Progress Notes (Signed)
Hemoglobin 6.7  Today.  MD made aware and patient is to come in tomorrow for a unit of blood.

## 2019-06-27 ENCOUNTER — Inpatient Hospital Stay: Payer: Medicare Other

## 2019-06-27 DIAGNOSIS — D649 Anemia, unspecified: Secondary | ICD-10-CM

## 2019-06-27 DIAGNOSIS — Z5111 Encounter for antineoplastic chemotherapy: Secondary | ICD-10-CM | POA: Diagnosis not present

## 2019-06-27 LAB — PREPARE RBC (CROSSMATCH)

## 2019-06-27 LAB — SAMPLE TO BLOOD BANK

## 2019-06-27 MED ORDER — SODIUM CHLORIDE 0.9% IV SOLUTION
250.0000 mL | Freq: Once | INTRAVENOUS | Status: AC
  Administered 2019-06-27: 250 mL via INTRAVENOUS
  Filled 2019-06-27: qty 250

## 2019-06-27 MED ORDER — ACETAMINOPHEN 325 MG PO TABS: 650.0000 mg | ORAL_TABLET | Freq: Once | ORAL | Status: DC

## 2019-06-27 MED ORDER — DIPHENHYDRAMINE HCL 25 MG PO CAPS: 25.0000 mg | ORAL_CAPSULE | Freq: Once | ORAL | Status: DC

## 2019-06-27 NOTE — Patient Instructions (Signed)
Blood Transfusion, Adult, Care After This sheet gives you information about how to care for yourself after your procedure. Your doctor may also give you more specific instructions. If you have problems or questions, contact your doctor. Follow these instructions at home:   Take over-the-counter and prescription medicines only as told by your doctor.  Go back to your normal activities as told by your doctor.  Follow instructions from your doctor about how to take care of the area where an IV tube was put into your vein (insertion site). Make sure you: ? Wash your hands with soap and water before you change your bandage (dressing). If there is no soap and water, use hand sanitizer. ? Change your bandage as told by your doctor.  Check your IV insertion site every day for signs of infection. Check for: ? More redness, swelling, or pain. ? More fluid or blood. ? Warmth. ? Pus or a bad smell. Contact a doctor if:  You have more redness, swelling, or pain around the IV insertion site.  You have more fluid or blood coming from the IV insertion site.  Your IV insertion site feels warm to the touch.  You have pus or a bad smell coming from the IV insertion site.  Your pee (urine) turns pink, red, or brown.  You feel weak after doing your normal activities. Get help right away if:  You have signs of a serious allergic or body defense (immune) system reaction, including: ? Itchiness. ? Hives. ? Trouble breathing. ? Anxiety. ? Pain in your chest or lower back. ? Fever, flushing, and chills. ? Fast pulse. ? Rash. ? Watery poop (diarrhea). ? Throwing up (vomiting). ? Dark pee. ? Serious headache. ? Dizziness. ? Stiff neck. ? Yellow color in your face or the white parts of your eyes (jaundice). Summary  After a blood transfusion, return to your normal activities as told by your doctor.  Every day, check for signs of infection where the IV tube was put into your vein.  Some  signs of infection are warm skin, more redness and pain, more fluid or blood, and pus or a bad smell where the needle went in.  Contact your doctor if you feel weak or have any unusual symptoms. This information is not intended to replace advice given to you by your health care provider. Make sure you discuss any questions you have with your health care provider. Document Released: 09/07/2014 Document Revised: 12/22/2017 Document Reviewed: 04/10/2016 Elsevier Patient Education  2020 Elsevier Inc.  

## 2019-06-28 LAB — TYPE AND SCREEN
ABO/RH(D): B POS
Antibody Screen: NEGATIVE
Unit division: 0

## 2019-06-28 LAB — BPAM RBC
Blood Product Expiration Date: 202011132359
ISSUE DATE / TIME: 202010271401
Unit Type and Rh: 5100

## 2019-07-07 ENCOUNTER — Other Ambulatory Visit: Payer: Self-pay

## 2019-07-10 ENCOUNTER — Other Ambulatory Visit: Payer: Self-pay

## 2019-07-10 ENCOUNTER — Inpatient Hospital Stay: Payer: Medicare Other

## 2019-07-10 ENCOUNTER — Inpatient Hospital Stay: Payer: Medicare Other | Attending: Hematology and Oncology

## 2019-07-10 VITALS — BP 128/53 | HR 73 | Temp 98.2°F | Resp 16

## 2019-07-10 DIAGNOSIS — E119 Type 2 diabetes mellitus without complications: Secondary | ICD-10-CM | POA: Diagnosis not present

## 2019-07-10 DIAGNOSIS — D4621 Refractory anemia with excess of blasts 1: Secondary | ICD-10-CM | POA: Diagnosis present

## 2019-07-10 DIAGNOSIS — Z5111 Encounter for antineoplastic chemotherapy: Secondary | ICD-10-CM | POA: Diagnosis not present

## 2019-07-10 DIAGNOSIS — D469 Myelodysplastic syndrome, unspecified: Secondary | ICD-10-CM

## 2019-07-10 LAB — CBC WITH DIFFERENTIAL/PLATELET
Abs Immature Granulocytes: 0.09 10*3/uL — ABNORMAL HIGH (ref 0.00–0.07)
Basophils Absolute: 0 10*3/uL (ref 0.0–0.1)
Basophils Relative: 0 %
Eosinophils Absolute: 0 10*3/uL (ref 0.0–0.5)
Eosinophils Relative: 0 %
HCT: 26.6 % — ABNORMAL LOW (ref 39.0–52.0)
Hemoglobin: 7.8 g/dL — ABNORMAL LOW (ref 13.0–17.0)
Immature Granulocytes: 2 %
Lymphocytes Relative: 34 %
Lymphs Abs: 1.9 10*3/uL (ref 0.7–4.0)
MCH: 26.4 pg (ref 26.0–34.0)
MCHC: 29.3 g/dL — ABNORMAL LOW (ref 30.0–36.0)
MCV: 90.2 fL (ref 80.0–100.0)
Monocytes Absolute: 0.7 10*3/uL (ref 0.1–1.0)
Monocytes Relative: 12 %
Neutro Abs: 2.9 10*3/uL (ref 1.7–7.7)
Neutrophils Relative %: 52 %
Platelets: 300 10*3/uL (ref 150–400)
RBC: 2.95 MIL/uL — ABNORMAL LOW (ref 4.22–5.81)
RDW: 18.2 % — ABNORMAL HIGH (ref 11.5–15.5)
WBC: 5.6 10*3/uL (ref 4.0–10.5)
nRBC: 0.4 % — ABNORMAL HIGH (ref 0.0–0.2)

## 2019-07-10 LAB — SAMPLE TO BLOOD BANK

## 2019-07-10 MED ORDER — DARBEPOETIN ALFA 300 MCG/0.6ML IJ SOSY
300.0000 ug | PREFILLED_SYRINGE | Freq: Once | INTRAMUSCULAR | Status: AC
  Administered 2019-07-10: 300 ug via SUBCUTANEOUS

## 2019-07-14 ENCOUNTER — Encounter: Payer: Self-pay | Admitting: Hematology and Oncology

## 2019-07-14 ENCOUNTER — Other Ambulatory Visit: Payer: Self-pay

## 2019-07-14 NOTE — Progress Notes (Signed)
No new changes noted today. The patient name and DOB has been verified by phone today. 

## 2019-07-15 NOTE — Progress Notes (Signed)
[facility]  [address], [address] [place], [place] [address] Phone: [phone]  Fax: [phone]   Clinic Day:  07/17/2019  Referring physician: Tracie Harrier, MD  Chief Complaint: Paul Pham. is a 85 y.o. 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   HPI: The patient was last seen in the hematology clinic on 06/05/2019. At that time, he was doing well.  Exam was unremarkable. Labs revealed a Hematocrit 25.6, hemoglobin 7.4, MCV 91.4, platelets 306,000, WBC 3800, ANC 1900. Ferritin 476. Creatinine was 0.76.  He began cycle #17 Vidaza (06/05/2019 - 06/09/2019).  He received 300 mcg Aranesp 06/12/2019, 06/26/2019, 07/10/2019.  He received a PRBC transfusion on 06/09/2019 and 06/27/2019  CBC followed 06/08/2019: Hematocrit 24.2, hemoglobin 6.9, MCV 91.0, platelets 239,000, WBC 4600, ANC 2500.  06/12/2019: Hematocrit 26.3, hemoglobin 7.8, MCV 88.6, platelets 203,000, WBC 5100, ANC 3200.  06/26/2019: Hematocrit 22.6, hemoglobin 6.7, MCV 90.4, platelets 147,000, WBC 3900, ANC 2100.  07/10/2019: Hematocrit 26.6, hemoglobin 7.8, MCV 90.2, platelets 300,000, WBC 5600, ANC 2900.   During the interim, his energy has been good. Wife has noticed a difference in his energy and is pleased.  Appetite is good.  Wife believes diabetes has been under control.  Symptomatically, he feels good today. He has been able to do yard work recently. If he can, he tries to walk 2 miles every day.    Past Medical History:  Diagnosis Date   Anemia    Arthritis    BPH (benign prostatic hypertrophy)    Complication of anesthesia    nausea   Diabetes type 2, controlled (HCC)    HOH (hard of hearing)    r and L ears-70% loss per pt   Hyperlipidemia    Hypertension     Past Surgical History:  Procedure Laterality Date   APPENDECTOMY     COLONOSCOPY  09/21/08   1 polyp found, tubular adenoma   HAND SURGERY     KNEE  SURGERY Left     Family History  Problem Relation Age of Onset   Aneurysm Mother    Heart disease Father    Cancer Brother        skin   Heart disease Brother    Heart disease Brother    Cancer Sister        skin   Aneurysm Brother    Heart disease Brother    Heart disease Sister    Heart disease Sister    COPD Sister    Diabetes Sister     Social History:  reports that he has never smoked. He has never used smokeless tobacco. He reports that he does not drink alcohol or use drugs. 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 Patient denies known exposures to radiation on toxins. 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 He had his 40-year marriage anniversary on 08/23/2018.He is walking 1-21miles per day. He lives in [place].   The patient is accompanied by his wife today.  Discussed avoiding crowds, wearing a face mask while in public, and practicing stringent handwashing.  Allergies: No Known Allergies  Current Medications: Current Outpatient Medications  Medication Sig Dispense Refill   aspirin 81 MG chewable tablet Chew 81 mg by mouth daily.     Cranberry (THERACRAN PO) Take 1 tablet by mouth daily.      cyanocobalamin 1000 MCG tablet Take 1,000 mcg by mouth daily.  finasteride (PROSCAR) 5 MG tablet Take 5 mg by mouth daily.     glimepiride (AMARYL) 1 MG tablet Take 1 tablet by mouth daily after breakfast.     glucose blood test strip FreeStyle Lite Strips     LANCETS ULTRA FINE MISC Lancets,Ultra Thin 26 gauge     lisinopril (PRINIVIL,ZESTRIL) 10 MG tablet Take 10 mg by mouth daily.     meclizine (ANTIVERT) 25 MG tablet Take 25 mg by mouth 2 (two) times daily as needed.      metformin (FORTAMET) 1000 MG (OSM) 24 hr tablet Take 1,000 mg by mouth 2 (two) times daily with a meal.     Multiple Vitamins-Minerals (CENTRUM SILVER PO) Take by mouth.     Omega-3 Fatty Acids (FISH OIL PO) Take 1 tablet by mouth daily.      Polyethylene  Glycol 3350 (MIRALAX PO) Take by mouth as needed.      Saw Palmetto-Phytosterols (PROSTATE SR PO) Take 1 tablet by mouth daily.      simvastatin (ZOCOR) 40 MG tablet Take 40 mg by mouth daily.     loperamide (IMODIUM) 2 MG capsule Take 1 capsule (2 mg total) by mouth See admin instructions. With onset of loose stool, take 4mg  followed by 2mg  every 2 hours until loose bowel movement stopped. Maximum: 16 mg/day (Patient not taking: Reported on 06/05/2019) 30 capsule 0   ondansetron (ZOFRAN) 4 MG tablet Take 1 tablet (4 mg total) by mouth every 6 (six) hours as needed for nausea or vomiting. (Patient not taking: Reported on 04/17/2019) 30 tablet 0   No current facility-administered medications for this visit.     Review of Systems  Constitutional: Negative for chills, diaphoresis, fever, malaise/fatigue and weight loss (up 3 lbs).       Feels "good".  Active; walks daily.  HENT: Positive for hearing loss. Negative for congestion, ear pain, nosebleeds, sinus pain and sore throat.   Eyes: Negative.  Negative for blurred vision, double vision, photophobia and pain.  Respiratory: Negative.  Negative for cough, sputum production, shortness of breath and wheezing.   Cardiovascular: Negative.  Negative for chest pain, palpitations, orthopnea, leg swelling and PND.  Gastrointestinal: Negative.  Negative for abdominal pain, blood in stool, constipation, diarrhea, melena, nausea and vomiting.       Eating well.  Genitourinary: Negative.  Negative for dysuria, frequency, hematuria and urgency.  Musculoskeletal: Negative.  Negative for back pain, myalgias and neck pain.  Skin: Negative.  Negative for itching and rash.  Neurological: Negative.  Negative for dizziness, tingling, sensory change, focal weakness, weakness and headaches.  Endo/Heme/Allergies: Negative.        Diabetes.  Psychiatric/Behavioral: Negative.  Negative for depression and memory loss. The patient is not nervous/anxious and does not  have insomnia.   All other systems reviewed and are negative.  Performance status (ECOG):  0  Vitals Blood pressure 121/78, pulse 84, temperature 98.9 F (37.2 C), temperature source Tympanic, resp. rate 18, height 6\' 1"  (1.854 m), weight 180 lb 1.9 oz (81.7 kg), SpO2 99 %.   Physical Exam  Constitutional: He is oriented to person, place, and time. He appears well-developed and well-nourished. No distress.  HENT:  Head: Normocephalic and atraumatic.  Mouth/Throat: Oropharynx is clear and moist. No oropharyngeal exudate.  Sparse short gray hair.  Near alopecia/male pattern baldness.  Mask.  Hearing aid.    Eyes: Pupils are equal, round, and reactive to light. Conjunctivae and EOM are normal. No scleral icterus.  Girtha Rm  rimmed glasses.  Blue eyes.  Neck: Normal range of motion. Neck supple. No JVD present.  Cardiovascular: Normal rate, regular rhythm and normal heart sounds. Exam reveals no gallop and no friction rub.  No murmur heard. Pulmonary/Chest: Effort normal and breath sounds normal. No respiratory distress. He has no wheezes. He has no rales.  Abdominal: Soft. Bowel sounds are normal. He exhibits no distension and no mass. There is no abdominal tenderness. There is no rebound and no guarding.  Umbilical hernia.  Musculoskeletal: Normal range of motion.        General: No tenderness or edema.  Lymphadenopathy:       Head (right side): No preauricular, no posterior auricular and no occipital adenopathy present.       Head (left side): No preauricular, no posterior auricular and no occipital adenopathy present.    He has no cervical adenopathy.    He has no axillary adenopathy.       Right: No inguinal and no supraclavicular adenopathy present.       Left: No inguinal and no supraclavicular adenopathy present.  Neurological: He is alert and oriented to person, place, and time. He has normal reflexes.  Skin: Skin is warm and dry. No rash noted. He is not diaphoretic. No erythema. No  pallor.  Psychiatric: He has a normal mood and affect. His behavior is normal. Judgment and thought content normal.  Nursing note and vitals reviewed.   Appointment on 07/17/2019  Component Date Value Ref Range Status   Sodium 07/17/2019 135  135 - 145 mmol/L Final   Potassium 07/17/2019 4.1  3.5 - 5.1 mmol/L Final   Chloride 07/17/2019 103  98 - 111 mmol/L Final   CO2 07/17/2019 24  22 - 32 mmol/L Final   Glucose, Bld 07/17/2019 236* 70 - 99 mg/dL Final   BUN 07/17/2019 25* 8 - 23 mg/dL Final   Creatinine, Ser 07/17/2019 0.85  0.61 - 1.24 mg/dL Final   Calcium 07/17/2019 8.8* 8.9 - 10.3 mg/dL Final   Total Protein 07/17/2019 7.1  6.5 - 8.1 g/dL Final   Albumin 07/17/2019 4.3  3.5 - 5.0 g/dL Final   AST 07/17/2019 22  15 - 41 U/L Final   ALT 07/17/2019 18  0 - 44 U/L Final   Alkaline Phosphatase 07/17/2019 30* 38 - 126 U/L Final   Total Bilirubin 07/17/2019 0.6  0.3 - 1.2 mg/dL Final   GFR calc non Af Amer 07/17/2019 >60  >60 mL/min Final   GFR calc Af Amer 07/17/2019 >60  >60 mL/min Final   Anion gap 07/17/2019 8  5 - 15 Final   Performed at [facility], [address], [place], [place] [address]   WBC 07/17/2019 4.5  4.0 - 10.5 K/uL Final   RBC 07/17/2019 2.82* 4.22 - 5.81 MIL/uL Final   Hemoglobin 07/17/2019 7.3* 13.0 - 17.0 g/dL Final   HCT 07/17/2019 25.2* 39.0 - 52.0 % Final   MCV 07/17/2019 89.4  80.0 - 100.0 fL Final   MCH 07/17/2019 25.9* 26.0 - 34.0 pg Final   MCHC 07/17/2019 29.0* 30.0 - 36.0 g/dL Final   RDW 07/17/2019 18.6* 11.5 - 15.5 % Final   Platelets 07/17/2019 262  150 - 400 K/uL Final   nRBC 07/17/2019 0.9* 0.0 - 0.2 % Final   Neutrophils Relative % 07/17/2019 57  % Final   Neutro Abs 07/17/2019 2.5  1.7 - 7.7 K/uL Final   Lymphocytes Relative 07/17/2019 32  % Final  Lymphs Abs 07/17/2019 1.5  0.7 - 4.0 K/uL Final   Monocytes Relative 07/17/2019 9  % Final   Monocytes Absolute 07/17/2019 0.4  0.1 - 1.0 K/uL  Final   Eosinophils Relative 07/17/2019 0  % Final   Eosinophils Absolute 07/17/2019 0.0  0.0 - 0.5 K/uL Final   Basophils Relative 07/17/2019 0  % Final   Basophils Absolute 07/17/2019 0.0  0.0 - 0.1 K/uL Final   Immature Granulocytes 07/17/2019 2  % Final   Abs Immature Granulocytes 07/17/2019 0.10* 0.00 - 0.07 K/uL Final   Performed at [facility], [address], [place], [place] [address]    Assessment:  Adiv Bonafede. is a 85 y.o. 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 Bone marrowon 09/22/2017 revealed a hypercellular for age with dyspoietic changes variably involving myeloid cell lines, but with main involvement of the granulocytic/monocytic cell line. This was associated with bone marrow monocytosis and borderline number to slight increase in blastic cells. The overall changes favor a primary myeloid neoplasm, particularly a myelodysplastic syndrome especially refractory anemia with excess blasts (RAEB-1) or possibly refractory cytopenia with multilineage dysplasia. Consideration was also given to an evolving myelodysplastic/myeloproliferative neoplasm such as chronic myelomonocytic leukemia but the lack of absolute peripheral monocytosis precluded such a diagnosis at this time. Flow cytometrywas negative. Cytogeneticswere normal (46, XY). Foundation one was positive for ASXL1 [id number], EZH2 Splice site [id number], RUNX1 [id number]. IPSS-R4.5, intermediate group.  Anemia work-up on 08/17/2017: Vitamin B12 (658), folate (22.0), and TSH (2.688). Retic was 2.6%. Haptoglobin was 116 on 08/19/2017. Epo levelwas 104.8 on 10/05/2017.  Ferritin has been followed: 106 on 08/17/2017, 131 on 11/04/2017, 144 on 05/30/2018, 394 on 09/01/2018, 441 on 11/28/2018, 394 on 01/02/2019, 480 on 02/06/2019, 392 on 03/13/2019, 527 on 04/17/2019, 533 on 05/29/2019, 476 on 06/05/2019, and 517 on 07/17/2019. Iron saturation was 34% on 08/17/2017 and 89% on 09/01/2018.  Bone marrowon 09/22/2017 revealed a  hypercellular for age with dyspoietic changes variably involving myeloid cell lines, but with main involvement of the granulocytic/monocytic cell line. This was associated with bone marrow monocytosis and borderline number to slight increase in blasts (5%). The overall changes favor a primary myeloid neoplasm, particularly a myelodysplastic syndrome especially refractory anemia with excess blasts (RAEB-1) or possibly refractory cytopenia with multilineage dysplasia. Consideration was also given to an evolving myelodysplastic/myeloproliferative neoplasm such as chronic myelomonocytic leukemia but the lack of absolute peripheral monocytosis precluded such a diagnosis at this time. Flow cytometrywas negative. Cytogeneticswere normal (46, XY). Foundation One was positive for ASXL1 [id number], EZH2 Splice site [id number], RUNX1 [id number].  He has received 17cycles ofVidaza(11/04/2017 - 08/22/2018; 10/24/2018- 06/05/2019). He has received Aranesp(initially 150 mcg every 2 weeks post chemotherapy then 300 mcg every 1-2 weeks after cycle #3; last 09/19/2018). He last received Aranesp on 07/10/2019. He has received 1-2 units of PRBCswith each cycle (last 06/27/2019).He has received21units of PRBCs since 08/17/2017.  Bone marrowon 08/12/2018 revealed a hypercellular marrow with dyspoietic changes. There was a borderline number of blasts (5%) seen by morphology. The findings were similar to previous biopsy although the cellularity is slightly higher in the current material.consistent with previously known myelodysplastic syndrome. Flow cytometry was negative. Cytogenetics were normal (46, XY).  Symptomatically, he is doing well.  He walks daily.  Appetite is good.  He denies any B symptoms.  Exam is stable.  Plan: 1.   Labs today:CBC with diff, CMP, ferritin, hold tube. 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  He continue Vidaza  without side effects.               Treatment goal is to decrease transfusion dependence and prevent acceleration of disease (blasts).             Review counts today.  WBC 4500 with an ANC of 2500. Begin cycle #18 Vidazaday 1-5today. He last received Aranesp on 07/10/2019. Continue Aranesp every 2 weeks to decrease transfusion dependence. 3. Iron overload Ferritin 517 today. He has received21units of PRBCs to date (08/17/2017 - 06/27/2019). Anticipate anticipate initiation of an oral chelating agentif ferritin > 1000. 4.   Begin cycle #18 Vidaza day 1-5 this week. 5.   Labs on 07/20/2019(Thursday)- CBC and hold tube for possible transfusion on 11/20. 6.   RTC in 1 weeks for labs (CBC with diff, hold tube), +/- Aranesp. 7.   RTC in 3 weeks for labs (CBC with diff, hold tube), +/- Aranesp. 8.   RTC in5weeks for labs (CBC with diff, hold tube), +/- Aranesp. 9.   RTC in6weeks for MD assessment, labs (CBC with diff, CMP, ferritin, hold tube) and cycle #19 Vidaza (day 1-5).   I discussed the assessment and treatment plan with the patient.  The patient was provided an opportunity to ask questions and all were answered.  The patient agreed with the plan and demonstrated an understanding of the instructions.  The patient was advised to call back if the symptoms worsen or if the condition fails to improve as anticipated.  I provided 21 minutes (2:20 PM - 2:40 PM) of face-to-face time during this this encounter and > 50% was spent counseling as documented under my assessment and plan.    Lequita Asal, MD, PhD    07/17/2019, 2:40 PM  I, Samul Dada, am acting as a scribe for Lequita Asal, MD.  I, [place] Mike Gip, MD, have reviewed the above documentation for accuracy and completeness, and I agree with the above.

## 2019-07-17 ENCOUNTER — Inpatient Hospital Stay: Payer: Medicare Other

## 2019-07-17 ENCOUNTER — Encounter: Payer: Self-pay | Admitting: Hematology and Oncology

## 2019-07-17 ENCOUNTER — Inpatient Hospital Stay (HOSPITAL_BASED_OUTPATIENT_CLINIC_OR_DEPARTMENT_OTHER): Payer: Medicare Other | Admitting: Hematology and Oncology

## 2019-07-17 ENCOUNTER — Other Ambulatory Visit: Payer: Self-pay

## 2019-07-17 VITALS — BP 121/78 | HR 84 | Temp 98.9°F | Resp 18 | Ht 73.0 in | Wt 180.1 lb

## 2019-07-17 DIAGNOSIS — D469 Myelodysplastic syndrome, unspecified: Secondary | ICD-10-CM

## 2019-07-17 DIAGNOSIS — Z5111 Encounter for antineoplastic chemotherapy: Secondary | ICD-10-CM

## 2019-07-17 DIAGNOSIS — D630 Anemia in neoplastic disease: Secondary | ICD-10-CM | POA: Diagnosis not present

## 2019-07-17 LAB — COMPREHENSIVE METABOLIC PANEL
ALT: 18 U/L (ref 0–44)
AST: 22 U/L (ref 15–41)
Albumin: 4.3 g/dL (ref 3.5–5.0)
Alkaline Phosphatase: 30 U/L — ABNORMAL LOW (ref 38–126)
Anion gap: 8 (ref 5–15)
BUN: 25 mg/dL — ABNORMAL HIGH (ref 8–23)
CO2: 24 mmol/L (ref 22–32)
Calcium: 8.8 mg/dL — ABNORMAL LOW (ref 8.9–10.3)
Chloride: 103 mmol/L (ref 98–111)
Creatinine, Ser: 0.85 mg/dL (ref 0.61–1.24)
GFR calc Af Amer: >60 mL/min (ref >60)
GFR calc non Af Amer: >60 mL/min (ref >60)
Glucose, Bld: 236 mg/dL — ABNORMAL HIGH (ref 70–99)
Potassium: 4.1 mmol/L (ref 3.5–5.1)
Sodium: 135 mmol/L (ref 135–145)
Total Bilirubin: 0.6 mg/dL (ref 0.3–1.2)
Total Protein: 7.1 g/dL (ref 6.5–8.1)

## 2019-07-17 LAB — CBC WITH DIFFERENTIAL/PLATELET
Abs Immature Granulocytes: 0.1 10*3/uL — ABNORMAL HIGH (ref 0.00–0.07)
Basophils Absolute: 0 10*3/uL (ref 0.0–0.1)
Basophils Relative: 0 %
Eosinophils Absolute: 0 10*3/uL (ref 0.0–0.5)
Eosinophils Relative: 0 %
HCT: 25.2 % — ABNORMAL LOW (ref 39.0–52.0)
Hemoglobin: 7.3 g/dL — ABNORMAL LOW (ref 13.0–17.0)
Immature Granulocytes: 2 %
Lymphocytes Relative: 32 %
Lymphs Abs: 1.5 10*3/uL (ref 0.7–4.0)
MCH: 25.9 pg — ABNORMAL LOW (ref 26.0–34.0)
MCHC: 29 g/dL — ABNORMAL LOW (ref 30.0–36.0)
MCV: 89.4 fL (ref 80.0–100.0)
Monocytes Absolute: 0.4 10*3/uL (ref 0.1–1.0)
Monocytes Relative: 9 %
Neutro Abs: 2.5 10*3/uL (ref 1.7–7.7)
Neutrophils Relative %: 57 %
Platelets: 262 10*3/uL (ref 150–400)
RBC: 2.82 MIL/uL — ABNORMAL LOW (ref 4.22–5.81)
RDW: 18.6 % — ABNORMAL HIGH (ref 11.5–15.5)
WBC: 4.5 10*3/uL (ref 4.0–10.5)
nRBC: 0.9 % — ABNORMAL HIGH (ref 0.0–0.2)

## 2019-07-17 LAB — FERRITIN: Ferritin: 517 ng/mL — ABNORMAL HIGH (ref 24–336)

## 2019-07-17 LAB — SAMPLE TO BLOOD BANK

## 2019-07-17 MED ORDER — AZACITIDINE CHEMO SQ INJECTION
75.0000 mg/m2 | Freq: Once | INTRAMUSCULAR | Status: AC
  Administered 2019-07-17: 155 mg via SUBCUTANEOUS
  Filled 2019-07-17: qty 6.2

## 2019-07-17 MED ORDER — ONDANSETRON HCL 4 MG PO TABS: 8.0000 mg | ORAL_TABLET | Freq: Once | ORAL | Status: DC

## 2019-07-17 NOTE — Patient Instructions (Signed)
Azacitidine suspension for injection (subcutaneous use) What is this medicine? AZACITIDINE (ay za SITE i deen) is a chemotherapy drug. This medicine reduces the growth of cancer cells and can suppress the immune system. It is used for treating myelodysplastic syndrome or some types of leukemia. This medicine may be used for other purposes; ask your health care provider or pharmacist if you have questions. COMMON BRAND NAME(S): Vidaza What should I tell my health care provider before I take this medicine? They need to know if you have any of these conditions:  kidney disease  liver disease  liver tumors  an unusual or allergic reaction to azacitidine, mannitol, other medicines, foods, dyes, or preservatives  pregnant or trying to get pregnant  breast-feeding How should I use this medicine? This medicine is for injection under the skin. It is administered in a hospital or clinic by a specially trained health care professional. Talk to your pediatrician regarding the use of this medicine in children. While this drug may be prescribed for selected conditions, precautions do apply. Overdosage: If you think you have taken too much of this medicine contact a poison control center or emergency room at once. NOTE: This medicine is only for you. Do not share this medicine with others. What if I miss a dose? It is important not to miss your dose. Call your doctor or health care professional if you are unable to keep an appointment. What may interact with this medicine? Interactions have not been studied. Give your health care provider a list of all the medicines, herbs, non-prescription drugs, or dietary supplements you use. Also tell them if you smoke, drink alcohol, or use illegal drugs. Some items may interact with your medicine. This list may not describe all possible interactions. Give your health care provider a list of all the medicines, herbs, non-prescription drugs, or dietary supplements  you use. Also tell them if you smoke, drink alcohol, or use illegal drugs. Some items may interact with your medicine. What should I watch for while using this medicine? Visit your doctor for checks on your progress. This drug may make you feel generally unwell. This is not uncommon, as chemotherapy can affect healthy cells as well as cancer cells. Report any side effects. Continue your course of treatment even though you feel ill unless your doctor tells you to stop. In some cases, you may be given additional medicines to help with side effects. Follow all directions for their use. Call your doctor or health care professional for advice if you get a fever, chills or sore throat, or other symptoms of a cold or flu. Do not treat yourself. This drug decreases your body's ability to fight infections. Try to avoid being around people who are sick. This medicine may increase your risk to bruise or bleed. Call your doctor or health care professional if you notice any unusual bleeding. You may need blood work done while you are taking this medicine. Do not become pregnant while taking this medicine and for 6 months after the last dose. Women should inform their doctor if they wish to become pregnant or think they might be pregnant. Men should not father a child while taking this medicine and for 3 months after the last dose. There is a potential for serious side effects to an unborn child. Talk to your health care professional or pharmacist for more information. Do not breast-feed an infant while taking this medicine and for 1 week after the last dose. This medicine may interfere with   the ability to have a child. Talk with your doctor or health care professional if you are concerned about your fertility. What side effects may I notice from receiving this medicine? Side effects that you should report to your doctor or health care professional as soon as possible:  allergic reactions like skin rash, itching or  hives, swelling of the face, lips, or tongue  low blood counts - this medicine may decrease the number of white blood cells, red blood cells and platelets. You may be at increased risk for infections and bleeding.  signs of infection - fever or chills, cough, sore throat, pain passing urine  signs of decreased platelets or bleeding - bruising, pinpoint red spots on the skin, black, tarry stools, blood in the urine  signs of decreased red blood cells - unusually weak or tired, fainting spells, lightheadedness  signs and symptoms of kidney injury like trouble passing urine or change in the amount of urine  signs and symptoms of liver injury like dark yellow or brown urine; general ill feeling or flu-like symptoms; light-colored stools; loss of appetite; nausea; right upper belly pain; unusually weak or tired; yellowing of the eyes or skin Side effects that usually do not require medical attention (report to your doctor or health care professional if they continue or are bothersome):  constipation  diarrhea  nausea, vomiting  pain or redness at the injection site  unusually weak or tired This list may not describe all possible side effects. Call your doctor for medical advice about side effects. You may report side effects to FDA at 1-800-FDA-1088. Where should I keep my medicine? This drug is given in a hospital or clinic and will not be stored at home. NOTE: This sheet is a summary. It may not cover all possible information. If you have questions about this medicine, talk to your doctor, pharmacist, or health care provider.  2020 Elsevier/Gold Standard (2016-09-15 14:37:51)  

## 2019-07-18 ENCOUNTER — Other Ambulatory Visit: Payer: Self-pay

## 2019-07-18 ENCOUNTER — Inpatient Hospital Stay: Payer: Medicare Other

## 2019-07-18 VITALS — BP 148/58 | HR 65 | Temp 97.8°F | Resp 18

## 2019-07-18 DIAGNOSIS — Z5111 Encounter for antineoplastic chemotherapy: Secondary | ICD-10-CM | POA: Diagnosis not present

## 2019-07-18 DIAGNOSIS — N400 Enlarged prostate without lower urinary tract symptoms: Secondary | ICD-10-CM

## 2019-07-18 DIAGNOSIS — D469 Myelodysplastic syndrome, unspecified: Secondary | ICD-10-CM

## 2019-07-18 MED ORDER — AZACITIDINE CHEMO SQ INJECTION
75.0000 mg/m2 | Freq: Once | INTRAMUSCULAR | Status: AC
  Administered 2019-07-18: 15:00:00 155 mg via SUBCUTANEOUS
  Filled 2019-07-18: qty 6.2

## 2019-07-18 MED ORDER — ONDANSETRON HCL 4 MG PO TABS: 4.0000 mg | ORAL_TABLET | Freq: Four times a day (QID) | ORAL | 2 refills | Status: AC | PRN

## 2019-07-18 MED ORDER — ONDANSETRON HCL 4 MG PO TABS: 8.0000 mg | ORAL_TABLET | Freq: Once | ORAL | Status: DC

## 2019-07-19 ENCOUNTER — Other Ambulatory Visit: Payer: Self-pay

## 2019-07-19 ENCOUNTER — Telehealth: Payer: Self-pay

## 2019-07-19 ENCOUNTER — Inpatient Hospital Stay: Payer: Medicare Other

## 2019-07-19 VITALS — BP 145/59 | HR 85 | Temp 98.2°F | Resp 18

## 2019-07-19 DIAGNOSIS — Z5111 Encounter for antineoplastic chemotherapy: Secondary | ICD-10-CM | POA: Diagnosis not present

## 2019-07-19 DIAGNOSIS — D469 Myelodysplastic syndrome, unspecified: Secondary | ICD-10-CM

## 2019-07-19 MED ORDER — ONDANSETRON HCL 4 MG PO TABS: 8.0000 mg | ORAL_TABLET | Freq: Once | ORAL | Status: DC

## 2019-07-19 MED ORDER — AZACITIDINE CHEMO SQ INJECTION
75.0000 mg/m2 | Freq: Once | INTRAMUSCULAR | Status: AC
  Administered 2019-07-19: 155 mg via SUBCUTANEOUS
  Filled 2019-07-19: qty 6.2

## 2019-07-19 NOTE — Telephone Encounter (Signed)
Refilled  Zofran 4 mg 1 tab po Q 6 hrs prn # 30 with 2 refills. The patient has been made aware.

## 2019-07-19 NOTE — Patient Instructions (Signed)
Azacitidine suspension for injection (subcutaneous use) What is this medicine? AZACITIDINE (ay za SITE i deen) is a chemotherapy drug. This medicine reduces the growth of cancer cells and can suppress the immune system. It is used for treating myelodysplastic syndrome or some types of leukemia. This medicine may be used for other purposes; ask your health care provider or pharmacist if you have questions. COMMON BRAND NAME(S): Vidaza What should I tell my health care provider before I take this medicine? They need to know if you have any of these conditions:  kidney disease  liver disease  liver tumors  an unusual or allergic reaction to azacitidine, mannitol, other medicines, foods, dyes, or preservatives  pregnant or trying to get pregnant  breast-feeding How should I use this medicine? This medicine is for injection under the skin. It is administered in a hospital or clinic by a specially trained health care professional. Talk to your pediatrician regarding the use of this medicine in children. While this drug may be prescribed for selected conditions, precautions do apply. Overdosage: If you think you have taken too much of this medicine contact a poison control center or emergency room at once. NOTE: This medicine is only for you. Do not share this medicine with others. What if I miss a dose? It is important not to miss your dose. Call your doctor or health care professional if you are unable to keep an appointment. What may interact with this medicine? Interactions have not been studied. Give your health care provider a list of all the medicines, herbs, non-prescription drugs, or dietary supplements you use. Also tell them if you smoke, drink alcohol, or use illegal drugs. Some items may interact with your medicine. This list may not describe all possible interactions. Give your health care provider a list of all the medicines, herbs, non-prescription drugs, or dietary supplements  you use. Also tell them if you smoke, drink alcohol, or use illegal drugs. Some items may interact with your medicine. What should I watch for while using this medicine? Visit your doctor for checks on your progress. This drug may make you feel generally unwell. This is not uncommon, as chemotherapy can affect healthy cells as well as cancer cells. Report any side effects. Continue your course of treatment even though you feel ill unless your doctor tells you to stop. In some cases, you may be given additional medicines to help with side effects. Follow all directions for their use. Call your doctor or health care professional for advice if you get a fever, chills or sore throat, or other symptoms of a cold or flu. Do not treat yourself. This drug decreases your body's ability to fight infections. Try to avoid being around people who are sick. This medicine may increase your risk to bruise or bleed. Call your doctor or health care professional if you notice any unusual bleeding. You may need blood work done while you are taking this medicine. Do not become pregnant while taking this medicine and for 6 months after the last dose. Women should inform their doctor if they wish to become pregnant or think they might be pregnant. Men should not father a child while taking this medicine and for 3 months after the last dose. There is a potential for serious side effects to an unborn child. Talk to your health care professional or pharmacist for more information. Do not breast-feed an infant while taking this medicine and for 1 week after the last dose. This medicine may interfere with   the ability to have a child. Talk with your doctor or health care professional if you are concerned about your fertility. What side effects may I notice from receiving this medicine? Side effects that you should report to your doctor or health care professional as soon as possible:  allergic reactions like skin rash, itching or  hives, swelling of the face, lips, or tongue  low blood counts - this medicine may decrease the number of white blood cells, red blood cells and platelets. You may be at increased risk for infections and bleeding.  signs of infection - fever or chills, cough, sore throat, pain passing urine  signs of decreased platelets or bleeding - bruising, pinpoint red spots on the skin, black, tarry stools, blood in the urine  signs of decreased red blood cells - unusually weak or tired, fainting spells, lightheadedness  signs and symptoms of kidney injury like trouble passing urine or change in the amount of urine  signs and symptoms of liver injury like dark yellow or brown urine; general ill feeling or flu-like symptoms; light-colored stools; loss of appetite; nausea; right upper belly pain; unusually weak or tired; yellowing of the eyes or skin Side effects that usually do not require medical attention (report to your doctor or health care professional if they continue or are bothersome):  constipation  diarrhea  nausea, vomiting  pain or redness at the injection site  unusually weak or tired This list may not describe all possible side effects. Call your doctor for medical advice about side effects. You may report side effects to FDA at 1-800-FDA-1088. Where should I keep my medicine? This drug is given in a hospital or clinic and will not be stored at home. NOTE: This sheet is a summary. It may not cover all possible information. If you have questions about this medicine, talk to your doctor, pharmacist, or health care provider.  2020 Elsevier/Gold Standard (2016-09-15 14:37:51)  

## 2019-07-20 ENCOUNTER — Other Ambulatory Visit: Payer: Self-pay

## 2019-07-20 ENCOUNTER — Other Ambulatory Visit: Payer: Self-pay | Admitting: Hematology and Oncology

## 2019-07-20 ENCOUNTER — Inpatient Hospital Stay: Payer: Medicare Other

## 2019-07-20 VITALS — BP 151/51 | HR 94 | Temp 98.0°F | Resp 18

## 2019-07-20 DIAGNOSIS — D630 Anemia in neoplastic disease: Secondary | ICD-10-CM

## 2019-07-20 DIAGNOSIS — D469 Myelodysplastic syndrome, unspecified: Secondary | ICD-10-CM

## 2019-07-20 DIAGNOSIS — Z5111 Encounter for antineoplastic chemotherapy: Secondary | ICD-10-CM | POA: Diagnosis not present

## 2019-07-20 LAB — CBC WITH DIFFERENTIAL/PLATELET
Abs Immature Granulocytes: 0.22 10*3/uL — ABNORMAL HIGH (ref 0.00–0.07)
Basophils Absolute: 0 10*3/uL (ref 0.0–0.1)
Basophils Relative: 0 %
Eosinophils Absolute: 0 10*3/uL (ref 0.0–0.5)
Eosinophils Relative: 0 %
HCT: 23.6 % — ABNORMAL LOW (ref 39.0–52.0)
Hemoglobin: 6.8 g/dL — ABNORMAL LOW (ref 13.0–17.0)
Immature Granulocytes: 5 %
Lymphocytes Relative: 28 %
Lymphs Abs: 1.4 10*3/uL (ref 0.7–4.0)
MCH: 26.1 pg (ref 26.0–34.0)
MCHC: 28.8 g/dL — ABNORMAL LOW (ref 30.0–36.0)
MCV: 90.4 fL (ref 80.0–100.0)
Monocytes Absolute: 0.8 10*3/uL (ref 0.1–1.0)
Monocytes Relative: 16 %
Neutro Abs: 2.5 10*3/uL (ref 1.7–7.7)
Neutrophils Relative %: 51 %
Platelets: 215 10*3/uL (ref 150–400)
RBC: 2.61 MIL/uL — ABNORMAL LOW (ref 4.22–5.81)
RDW: 18.4 % — ABNORMAL HIGH (ref 11.5–15.5)
WBC: 4.8 10*3/uL (ref 4.0–10.5)
nRBC: 0.4 % — ABNORMAL HIGH (ref 0.0–0.2)

## 2019-07-20 LAB — SAMPLE TO BLOOD BANK

## 2019-07-20 MED ORDER — ONDANSETRON HCL 4 MG PO TABS: 8.0000 mg | ORAL_TABLET | Freq: Once | ORAL | Status: DC

## 2019-07-20 MED ORDER — AZACITIDINE CHEMO SQ INJECTION
75.0000 mg/m2 | Freq: Once | INTRAMUSCULAR | Status: AC
  Administered 2019-07-20: 155 mg via SUBCUTANEOUS
  Filled 2019-07-20: qty 6.2

## 2019-07-20 NOTE — Patient Instructions (Signed)
Azacitidine suspension for injection (subcutaneous use) What is this medicine? AZACITIDINE (ay za SITE i deen) is a chemotherapy drug. This medicine reduces the growth of cancer cells and can suppress the immune system. It is used for treating myelodysplastic syndrome or some types of leukemia. This medicine may be used for other purposes; ask your health care provider or pharmacist if you have questions. COMMON BRAND NAME(S): Vidaza What should I tell my health care provider before I take this medicine? They need to know if you have any of these conditions:  kidney disease  liver disease  liver tumors  an unusual or allergic reaction to azacitidine, mannitol, other medicines, foods, dyes, or preservatives  pregnant or trying to get pregnant  breast-feeding How should I use this medicine? This medicine is for injection under the skin. It is administered in a hospital or clinic by a specially trained health care professional. Talk to your pediatrician regarding the use of this medicine in children. While this drug may be prescribed for selected conditions, precautions do apply. Overdosage: If you think you have taken too much of this medicine contact a poison control center or emergency room at once. NOTE: This medicine is only for you. Do not share this medicine with others. What if I miss a dose? It is important not to miss your dose. Call your doctor or health care professional if you are unable to keep an appointment. What may interact with this medicine? Interactions have not been studied. Give your health care provider a list of all the medicines, herbs, non-prescription drugs, or dietary supplements you use. Also tell them if you smoke, drink alcohol, or use illegal drugs. Some items may interact with your medicine. This list may not describe all possible interactions. Give your health care provider a list of all the medicines, herbs, non-prescription drugs, or dietary supplements  you use. Also tell them if you smoke, drink alcohol, or use illegal drugs. Some items may interact with your medicine. What should I watch for while using this medicine? Visit your doctor for checks on your progress. This drug may make you feel generally unwell. This is not uncommon, as chemotherapy can affect healthy cells as well as cancer cells. Report any side effects. Continue your course of treatment even though you feel ill unless your doctor tells you to stop. In some cases, you may be given additional medicines to help with side effects. Follow all directions for their use. Call your doctor or health care professional for advice if you get a fever, chills or sore throat, or other symptoms of a cold or flu. Do not treat yourself. This drug decreases your body's ability to fight infections. Try to avoid being around people who are sick. This medicine may increase your risk to bruise or bleed. Call your doctor or health care professional if you notice any unusual bleeding. You may need blood work done while you are taking this medicine. Do not become pregnant while taking this medicine and for 6 months after the last dose. Women should inform their doctor if they wish to become pregnant or think they might be pregnant. Men should not father a child while taking this medicine and for 3 months after the last dose. There is a potential for serious side effects to an unborn child. Talk to your health care professional or pharmacist for more information. Do not breast-feed an infant while taking this medicine and for 1 week after the last dose. This medicine may interfere with   the ability to have a child. Talk with your doctor or health care professional if you are concerned about your fertility. What side effects may I notice from receiving this medicine? Side effects that you should report to your doctor or health care professional as soon as possible:  allergic reactions like skin rash, itching or  hives, swelling of the face, lips, or tongue  low blood counts - this medicine may decrease the number of white blood cells, red blood cells and platelets. You may be at increased risk for infections and bleeding.  signs of infection - fever or chills, cough, sore throat, pain passing urine  signs of decreased platelets or bleeding - bruising, pinpoint red spots on the skin, black, tarry stools, blood in the urine  signs of decreased red blood cells - unusually weak or tired, fainting spells, lightheadedness  signs and symptoms of kidney injury like trouble passing urine or change in the amount of urine  signs and symptoms of liver injury like dark yellow or brown urine; general ill feeling or flu-like symptoms; light-colored stools; loss of appetite; nausea; right upper belly pain; unusually weak or tired; yellowing of the eyes or skin Side effects that usually do not require medical attention (report to your doctor or health care professional if they continue or are bothersome):  constipation  diarrhea  nausea, vomiting  pain or redness at the injection site  unusually weak or tired This list may not describe all possible side effects. Call your doctor for medical advice about side effects. You may report side effects to FDA at 1-800-FDA-1088. Where should I keep my medicine? This drug is given in a hospital or clinic and will not be stored at home. NOTE: This sheet is a summary. It may not cover all possible information. If you have questions about this medicine, talk to your doctor, pharmacist, or health care provider.  2020 Elsevier/Gold Standard (2016-09-15 14:37:51)  

## 2019-07-21 ENCOUNTER — Other Ambulatory Visit: Payer: Self-pay

## 2019-07-21 ENCOUNTER — Inpatient Hospital Stay: Payer: Medicare Other

## 2019-07-21 VITALS — BP 143/60 | HR 69 | Temp 98.5°F | Resp 16

## 2019-07-21 DIAGNOSIS — Z5111 Encounter for antineoplastic chemotherapy: Secondary | ICD-10-CM | POA: Diagnosis not present

## 2019-07-21 DIAGNOSIS — D630 Anemia in neoplastic disease: Secondary | ICD-10-CM

## 2019-07-21 DIAGNOSIS — D469 Myelodysplastic syndrome, unspecified: Secondary | ICD-10-CM

## 2019-07-21 LAB — PREPARE RBC (CROSSMATCH)

## 2019-07-21 MED ORDER — AZACITIDINE CHEMO SQ INJECTION
75.0000 mg/m2 | Freq: Once | INTRAMUSCULAR | Status: AC
  Administered 2019-07-21: 155 mg via SUBCUTANEOUS
  Filled 2019-07-21: qty 6.2

## 2019-07-21 MED ORDER — SODIUM CHLORIDE 0.9% IV SOLUTION
250.0000 mL | Freq: Once | INTRAVENOUS | Status: AC
  Administered 2019-07-21: 250 mL via INTRAVENOUS
  Filled 2019-07-21: qty 250

## 2019-07-21 MED ORDER — DIPHENHYDRAMINE HCL 25 MG PO CAPS: 25.0000 mg | ORAL_CAPSULE | Freq: Once | ORAL | Status: DC

## 2019-07-21 MED ORDER — ACETAMINOPHEN 325 MG PO TABS: 650.0000 mg | ORAL_TABLET | Freq: Once | ORAL | Status: DC

## 2019-07-21 MED ORDER — ONDANSETRON HCL 4 MG PO TABS: 8.0000 mg | ORAL_TABLET | Freq: Once | ORAL | Status: DC

## 2019-07-22 LAB — BPAM RBC
Blood Product Expiration Date: 202012162359
ISSUE DATE / TIME: 202011200954
Unit Type and Rh: 5100

## 2019-07-22 LAB — TYPE AND SCREEN
ABO/RH(D): B POS
Antibody Screen: NEGATIVE
Unit division: 0

## 2019-07-24 ENCOUNTER — Inpatient Hospital Stay: Payer: Medicare Other

## 2019-07-24 ENCOUNTER — Other Ambulatory Visit: Payer: Self-pay

## 2019-07-24 VITALS — BP 156/60 | HR 82 | Temp 96.6°F | Resp 16

## 2019-07-24 DIAGNOSIS — D649 Anemia, unspecified: Secondary | ICD-10-CM

## 2019-07-24 DIAGNOSIS — D469 Myelodysplastic syndrome, unspecified: Secondary | ICD-10-CM

## 2019-07-24 DIAGNOSIS — Z5111 Encounter for antineoplastic chemotherapy: Secondary | ICD-10-CM | POA: Diagnosis not present

## 2019-07-24 LAB — CBC WITH DIFFERENTIAL/PLATELET
Abs Immature Granulocytes: 0.07 10*3/uL (ref 0.00–0.07)
Basophils Absolute: 0 10*3/uL (ref 0.0–0.1)
Basophils Relative: 0 %
Eosinophils Absolute: 0 10*3/uL (ref 0.0–0.5)
Eosinophils Relative: 0 %
HCT: 26.1 % — ABNORMAL LOW (ref 39.0–52.0)
Hemoglobin: 7.8 g/dL — ABNORMAL LOW (ref 13.0–17.0)
Immature Granulocytes: 2 %
Lymphocytes Relative: 40 %
Lymphs Abs: 1.5 10*3/uL (ref 0.7–4.0)
MCH: 26.7 pg (ref 26.0–34.0)
MCHC: 29.9 g/dL — ABNORMAL LOW (ref 30.0–36.0)
MCV: 89.4 fL (ref 80.0–100.0)
Monocytes Absolute: 0.4 10*3/uL (ref 0.1–1.0)
Monocytes Relative: 11 %
Neutro Abs: 1.7 10*3/uL (ref 1.7–7.7)
Neutrophils Relative %: 47 %
Platelets: 194 10*3/uL (ref 150–400)
RBC: 2.92 MIL/uL — ABNORMAL LOW (ref 4.22–5.81)
RDW: 17.3 % — ABNORMAL HIGH (ref 11.5–15.5)
WBC: 3.7 10*3/uL — ABNORMAL LOW (ref 4.0–10.5)
nRBC: 0.5 % — ABNORMAL HIGH (ref 0.0–0.2)

## 2019-07-24 LAB — SAMPLE TO BLOOD BANK

## 2019-07-24 MED ORDER — DARBEPOETIN ALFA 300 MCG/0.6ML IJ SOSY
PREFILLED_SYRINGE | INTRAMUSCULAR | Status: AC
  Filled 2019-07-24: qty 0.6

## 2019-07-24 MED ORDER — DARBEPOETIN ALFA 300 MCG/0.6ML IJ SOSY
300.0000 ug | PREFILLED_SYRINGE | Freq: Once | INTRAMUSCULAR | Status: AC
  Administered 2019-07-24: 300 ug via SUBCUTANEOUS

## 2019-07-24 NOTE — Patient Instructions (Signed)
Darbepoetin Alfa injection What is this medicine? DARBEPOETIN ALFA (dar be POE e tin AL fa) helps your body make more red blood cells. It is used to treat anemia caused by chronic kidney failure and chemotherapy. This medicine may be used for other purposes; ask your health care provider or pharmacist if you have questions. COMMON BRAND NAME(S): Aranesp What should I tell my health care provider before I take this medicine? They need to know if you have any of these conditions:  blood clotting disorders or history of blood clots  cancer patient not on chemotherapy  cystic fibrosis  heart disease, such as angina, heart failure, or a history of a heart attack  hemoglobin level of 12 g/dL or greater  high blood pressure  low levels of folate, iron, or vitamin B12  seizures  an unusual or allergic reaction to darbepoetin, erythropoietin, albumin, hamster proteins, latex, other medicines, foods, dyes, or preservatives  pregnant or trying to get pregnant  breast-feeding How should I use this medicine? This medicine is for injection into a vein or under the skin. It is usually given by a health care professional in a hospital or clinic setting. If you get this medicine at home, you will be taught how to prepare and give this medicine. Use exactly as directed. Take your medicine at regular intervals. Do not take your medicine more often than directed. It is important that you put your used needles and syringes in a special sharps container. Do not put them in a trash can. If you do not have a sharps container, call your pharmacist or healthcare provider to get one. A special MedGuide will be given to you by the pharmacist with each prescription and refill. Be sure to read this information carefully each time. Talk to your pediatrician regarding the use of this medicine in children. While this medicine may be used in children as young as 1 month of age for selected conditions, precautions do  apply. Overdosage: If you think you have taken too much of this medicine contact a poison control center or emergency room at once. NOTE: This medicine is only for you. Do not share this medicine with others. What if I miss a dose? If you miss a dose, take it as soon as you can. If it is almost time for your next dose, take only that dose. Do not take double or extra doses. What may interact with this medicine? Do not take this medicine with any of the following medications:  epoetin alfa This list may not describe all possible interactions. Give your health care provider a list of all the medicines, herbs, non-prescription drugs, or dietary supplements you use. Also tell them if you smoke, drink alcohol, or use illegal drugs. Some items may interact with your medicine. What should I watch for while using this medicine? Your condition will be monitored carefully while you are receiving this medicine. You may need blood work done while you are taking this medicine. This medicine may cause a decrease in vitamin B6. You should make sure that you get enough vitamin B6 while you are taking this medicine. Discuss the foods you eat and the vitamins you take with your health care professional. What side effects may I notice from receiving this medicine? Side effects that you should report to your doctor or health care professional as soon as possible:  allergic reactions like skin rash, itching or hives, swelling of the face, lips, or tongue  breathing problems  changes in   vision  chest pain  confusion, trouble speaking or understanding  feeling faint or lightheaded, falls  high blood pressure  muscle aches or pains  pain, swelling, warmth in the leg  rapid weight gain  severe headaches  sudden numbness or weakness of the face, arm or leg  trouble walking, dizziness, loss of balance or coordination  seizures (convulsions)  swelling of the ankles, feet, hands  unusually weak or  tired Side effects that usually do not require medical attention (report to your doctor or health care professional if they continue or are bothersome):  diarrhea  fever, chills (flu-like symptoms)  headaches  nausea, vomiting  redness, stinging, or swelling at site where injected This list may not describe all possible side effects. Call your doctor for medical advice about side effects. You may report side effects to FDA at 1-800-FDA-1088. Where should I keep my medicine? Keep out of the reach of children. Store in a refrigerator between 2 and 8 degrees C (36 and 46 degrees F). Do not freeze. Do not shake. Throw away any unused portion if using a single-dose vial. Throw away any unused medicine after the expiration date. NOTE: This sheet is a summary. It may not cover all possible information. If you have questions about this medicine, talk to your doctor, pharmacist, or health care provider.  2020 [employer] Standard (2017-09-01 16:44:20)  

## 2019-08-07 ENCOUNTER — Other Ambulatory Visit: Payer: Self-pay | Admitting: Hematology and Oncology

## 2019-08-07 ENCOUNTER — Other Ambulatory Visit: Payer: Self-pay

## 2019-08-07 ENCOUNTER — Ambulatory Visit: Payer: TRICARE For Life (TFL)

## 2019-08-07 ENCOUNTER — Inpatient Hospital Stay: Payer: Medicare Other | Attending: Hematology and Oncology

## 2019-08-07 VITALS — BP 125/71 | HR 86 | Temp 99.0°F | Resp 16

## 2019-08-07 DIAGNOSIS — D649 Anemia, unspecified: Secondary | ICD-10-CM

## 2019-08-07 DIAGNOSIS — D4621 Refractory anemia with excess of blasts 1: Secondary | ICD-10-CM | POA: Diagnosis not present

## 2019-08-07 DIAGNOSIS — D469 Myelodysplastic syndrome, unspecified: Secondary | ICD-10-CM

## 2019-08-07 LAB — CBC WITH DIFFERENTIAL/PLATELET
Abs Immature Granulocytes: 0.06 10*3/uL (ref 0.00–0.07)
Basophils Absolute: 0 10*3/uL (ref 0.0–0.1)
Basophils Relative: 0 %
Eosinophils Absolute: 0 10*3/uL (ref 0.0–0.5)
Eosinophils Relative: 0 %
HCT: 22.3 % — ABNORMAL LOW (ref 39.0–52.0)
Hemoglobin: 6.8 g/dL — ABNORMAL LOW (ref 13.0–17.0)
Immature Granulocytes: 2 %
Lymphocytes Relative: 51 %
Lymphs Abs: 1.7 10*3/uL (ref 0.7–4.0)
MCH: 27.4 pg (ref 26.0–34.0)
MCHC: 30.5 g/dL (ref 30.0–36.0)
MCV: 89.9 fL (ref 80.0–100.0)
Monocytes Absolute: 0.2 10*3/uL (ref 0.1–1.0)
Monocytes Relative: 7 %
Neutro Abs: 1.3 10*3/uL — ABNORMAL LOW (ref 1.7–7.7)
Neutrophils Relative %: 40 %
Platelets: 140 10*3/uL — ABNORMAL LOW (ref 150–400)
RBC: 2.48 MIL/uL — ABNORMAL LOW (ref 4.22–5.81)
RDW: 18.6 % — ABNORMAL HIGH (ref 11.5–15.5)
WBC: 3.2 10*3/uL — ABNORMAL LOW (ref 4.0–10.5)
nRBC: 1.2 % — ABNORMAL HIGH (ref 0.0–0.2)

## 2019-08-07 MED ORDER — DARBEPOETIN ALFA 300 MCG/0.6ML IJ SOSY
300.0000 ug | PREFILLED_SYRINGE | Freq: Once | INTRAMUSCULAR | Status: AC
  Administered 2019-08-07: 300 ug via SUBCUTANEOUS

## 2019-08-07 NOTE — Progress Notes (Signed)
Patient's hgb 6.8.  MD made aware.  Patient is to come for blood transfusion tomorrow at 11:30am.  Patient verbalized understanding.

## 2019-08-07 NOTE — Patient Instructions (Signed)
Darbepoetin Alfa injection What is this medicine? DARBEPOETIN ALFA (dar be POE e tin AL fa) helps your body make more red blood cells. It is used to treat anemia caused by chronic kidney failure and chemotherapy. This medicine may be used for other purposes; ask your health care provider or pharmacist if you have questions. COMMON BRAND NAME(S): Aranesp What should I tell my health care provider before I take this medicine? They need to know if you have any of these conditions:  blood clotting disorders or history of blood clots  cancer patient not on chemotherapy  cystic fibrosis  heart disease, such as angina, heart failure, or a history of a heart attack  hemoglobin level of 12 g/dL or greater  high blood pressure  low levels of folate, iron, or vitamin B12  seizures  an unusual or allergic reaction to darbepoetin, erythropoietin, albumin, hamster proteins, latex, other medicines, foods, dyes, or preservatives  pregnant or trying to get pregnant  breast-feeding How should I use this medicine? This medicine is for injection into a vein or under the skin. It is usually given by a health care professional in a hospital or clinic setting. If you get this medicine at home, you will be taught how to prepare and give this medicine. Use exactly as directed. Take your medicine at regular intervals. Do not take your medicine more often than directed. It is important that you put your used needles and syringes in a special sharps container. Do not put them in a trash can. If you do not have a sharps container, call your pharmacist or healthcare provider to get one. A special MedGuide will be given to you by the pharmacist with each prescription and refill. Be sure to read this information carefully each time. Talk to your pediatrician regarding the use of this medicine in children. While this medicine may be used in children as young as 1 month of age for selected conditions, precautions do  apply. Overdosage: If you think you have taken too much of this medicine contact a poison control center or emergency room at once. NOTE: This medicine is only for you. Do not share this medicine with others. What if I miss a dose? If you miss a dose, take it as soon as you can. If it is almost time for your next dose, take only that dose. Do not take double or extra doses. What may interact with this medicine? Do not take this medicine with any of the following medications:  epoetin alfa This list may not describe all possible interactions. Give your health care provider a list of all the medicines, herbs, non-prescription drugs, or dietary supplements you use. Also tell them if you smoke, drink alcohol, or use illegal drugs. Some items may interact with your medicine. What should I watch for while using this medicine? Your condition will be monitored carefully while you are receiving this medicine. You may need blood work done while you are taking this medicine. This medicine may cause a decrease in vitamin B6. You should make sure that you get enough vitamin B6 while you are taking this medicine. Discuss the foods you eat and the vitamins you take with your health care professional. What side effects may I notice from receiving this medicine? Side effects that you should report to your doctor or health care professional as soon as possible:  allergic reactions like skin rash, itching or hives, swelling of the face, lips, or tongue  breathing problems  changes in   vision  chest pain  confusion, trouble speaking or understanding  feeling faint or lightheaded, falls  high blood pressure  muscle aches or pains  pain, swelling, warmth in the leg  rapid weight gain  severe headaches  sudden numbness or weakness of the face, arm or leg  trouble walking, dizziness, loss of balance or coordination  seizures (convulsions)  swelling of the ankles, feet, hands  unusually weak or  tired Side effects that usually do not require medical attention (report to your doctor or health care professional if they continue or are bothersome):  diarrhea  fever, chills (flu-like symptoms)  headaches  nausea, vomiting  redness, stinging, or swelling at site where injected This list may not describe all possible side effects. Call your doctor for medical advice about side effects. You may report side effects to FDA at 1-800-FDA-1088. Where should I keep my medicine? Keep out of the reach of children. Store in a refrigerator between 2 and 8 degrees C (36 and 46 degrees F). Do not freeze. Do not shake. Throw away any unused portion if using a single-dose vial. Throw away any unused medicine after the expiration date. NOTE: This sheet is a summary. It may not cover all possible information. If you have questions about this medicine, talk to your doctor, pharmacist, or health care provider.  2020 [employer] Standard (2017-09-01 16:44:20)  

## 2019-08-08 ENCOUNTER — Inpatient Hospital Stay: Payer: Medicare Other

## 2019-08-08 DIAGNOSIS — D469 Myelodysplastic syndrome, unspecified: Secondary | ICD-10-CM

## 2019-08-08 DIAGNOSIS — D4621 Refractory anemia with excess of blasts 1: Secondary | ICD-10-CM | POA: Diagnosis not present

## 2019-08-08 DIAGNOSIS — D649 Anemia, unspecified: Secondary | ICD-10-CM

## 2019-08-08 LAB — SAMPLE TO BLOOD BANK

## 2019-08-08 LAB — PREPARE RBC (CROSSMATCH)

## 2019-08-08 MED ORDER — DIPHENHYDRAMINE HCL 25 MG PO CAPS: 25.0000 mg | ORAL_CAPSULE | Freq: Once | ORAL | Status: DC

## 2019-08-08 MED ORDER — SODIUM CHLORIDE 0.9% IV SOLUTION
250.0000 mL | Freq: Once | INTRAVENOUS | Status: AC
  Administered 2019-08-08: 250 mL via INTRAVENOUS
  Filled 2019-08-08: qty 250

## 2019-08-08 MED ORDER — ACETAMINOPHEN 325 MG PO TABS: 650.0000 mg | ORAL_TABLET | Freq: Once | ORAL | Status: DC

## 2019-08-08 NOTE — Patient Instructions (Signed)
Blood Transfusion, Adult, Care After This sheet gives you information about how to care for yourself after your procedure. Your doctor may also give you more specific instructions. If you have problems or questions, contact your doctor. Follow these instructions at home:   Take over-the-counter and prescription medicines only as told by your doctor.  Go back to your normal activities as told by your doctor.  Follow instructions from your doctor about how to take care of the area where an IV tube was put into your vein (insertion site). Make sure you: ? Wash your hands with soap and water before you change your bandage (dressing). If there is no soap and water, use hand sanitizer. ? Change your bandage as told by your doctor.  Check your IV insertion site every day for signs of infection. Check for: ? More redness, swelling, or pain. ? More fluid or blood. ? Warmth. ? Pus or a bad smell. Contact a doctor if:  You have more redness, swelling, or pain around the IV insertion site.  You have more fluid or blood coming from the IV insertion site.  Your IV insertion site feels warm to the touch.  You have pus or a bad smell coming from the IV insertion site.  Your pee (urine) turns pink, red, or brown.  You feel weak after doing your normal activities. Get help right away if:  You have signs of a serious allergic or body defense (immune) system reaction, including: ? Itchiness. ? Hives. ? Trouble breathing. ? Anxiety. ? Pain in your chest or lower back. ? Fever, flushing, and chills. ? Fast pulse. ? Rash. ? Watery poop (diarrhea). ? Throwing up (vomiting). ? Dark pee. ? Serious headache. ? Dizziness. ? Stiff neck. ? Yellow color in your face or the white parts of your eyes (jaundice). Summary  After a blood transfusion, return to your normal activities as told by your doctor.  Every day, check for signs of infection where the IV tube was put into your vein.  Some  signs of infection are warm skin, more redness and pain, more fluid or blood, and pus or a bad smell where the needle went in.  Contact your doctor if you feel weak or have any unusual symptoms. This information is not intended to replace advice given to you by your health care provider. Make sure you discuss any questions you have with your health care provider. Document Released: 09/07/2014 Document Revised: 12/22/2017 Document Reviewed: 04/10/2016 Elsevier Patient Education  2020 Elsevier Inc.  

## 2019-08-08 NOTE — Progress Notes (Signed)
UNMATCHED BLOOD PRODUCT NOTE  Compare the patient ID on the blood tag to the patient ID on the hospital armband and Blood Bank armband. Then confirm the unit number on the blood tag matches the unit number on the blood product.  If a discrepancy is discovered return the product to blood bank immediately.   Blood Product Type: Packed Red Blood Cells  Unit #: (Found on blood product bag, begins with [id number]  Product Code #: (Found on blood product bag, begins with E) [id number]   Start Time: 1216  Starting Rate: 164ml/hr  Rate increase/decreased  (if applicable): [id number]     ml/hr  Rate changed time (if applicable): XX123456   Stop Time: [id number]   All Other Documentation should be documented within the Blood Admin Flowsheet per policy.

## 2019-08-09 LAB — BPAM RBC
Blood Product Expiration Date: 202012182359
ISSUE DATE / TIME: 202012081206
Unit Type and Rh: 7300

## 2019-08-09 LAB — TYPE AND SCREEN
ABO/RH(D): B POS
Antibody Screen: NEGATIVE
Unit division: 0

## 2019-08-21 ENCOUNTER — Inpatient Hospital Stay: Payer: Medicare Other

## 2019-08-21 ENCOUNTER — Other Ambulatory Visit: Payer: Self-pay

## 2019-08-21 VITALS — BP 144/63 | HR 81 | Temp 98.2°F | Resp 18

## 2019-08-21 DIAGNOSIS — D4621 Refractory anemia with excess of blasts 1: Secondary | ICD-10-CM | POA: Diagnosis not present

## 2019-08-21 DIAGNOSIS — D469 Myelodysplastic syndrome, unspecified: Secondary | ICD-10-CM

## 2019-08-21 DIAGNOSIS — D649 Anemia, unspecified: Secondary | ICD-10-CM

## 2019-08-21 LAB — CBC WITH DIFFERENTIAL/PLATELET
Abs Immature Granulocytes: 0.03 10*3/uL (ref 0.00–0.07)
Basophils Absolute: 0 10*3/uL (ref 0.0–0.1)
Basophils Relative: 0 %
Eosinophils Absolute: 0 10*3/uL (ref 0.0–0.5)
Eosinophils Relative: 0 %
HCT: 27.9 % — ABNORMAL LOW (ref 39.0–52.0)
Hemoglobin: 8.2 g/dL — ABNORMAL LOW (ref 13.0–17.0)
Immature Granulocytes: 1 %
Lymphocytes Relative: 37 %
Lymphs Abs: 1.3 10*3/uL (ref 0.7–4.0)
MCH: 26.4 pg (ref 26.0–34.0)
MCHC: 29.4 g/dL — ABNORMAL LOW (ref 30.0–36.0)
MCV: 89.7 fL (ref 80.0–100.0)
Monocytes Absolute: 0.3 10*3/uL (ref 0.1–1.0)
Monocytes Relative: 10 %
Neutro Abs: 1.8 10*3/uL (ref 1.7–7.7)
Neutrophils Relative %: 52 %
Platelets: 256 10*3/uL (ref 150–400)
RBC: 3.11 MIL/uL — ABNORMAL LOW (ref 4.22–5.81)
RDW: 17.4 % — ABNORMAL HIGH (ref 11.5–15.5)
WBC: 3.5 10*3/uL — ABNORMAL LOW (ref 4.0–10.5)
nRBC: 0.6 % — ABNORMAL HIGH (ref 0.0–0.2)

## 2019-08-21 LAB — SAMPLE TO BLOOD BANK

## 2019-08-21 MED ORDER — DARBEPOETIN ALFA 300 MCG/0.6ML IJ SOSY
300.0000 ug | PREFILLED_SYRINGE | Freq: Once | INTRAMUSCULAR | Status: AC
  Administered 2019-08-21: 300 ug via SUBCUTANEOUS

## 2019-08-24 ENCOUNTER — Other Ambulatory Visit: Payer: Self-pay

## 2019-08-24 DIAGNOSIS — D649 Anemia, unspecified: Secondary | ICD-10-CM

## 2019-08-26 NOTE — Progress Notes (Signed)
[facility]  [address] [place], [place] [address] Phone: [phone]  Fax: [phone]   Clinic Day:  08/28/2019  Referring physician: Tracie Harrier, MD  Chief Complaint: Paul Sciarretta. is a 85 y.o. male with with RAEB-1 who is seen for 6 week assessment prior to cycle #19 Vidaza.   HPI: The patient was last seen in the hematology clinic on 07/17/2019. At that time, he was doing well.  He walked daily.  Appetite was good.  He denied any B symptoms. Exam was stable.  He received Vidaza from 07/17/2019 to 07/21/2019.  He tolerated his treatment well.  He received PRBC transfusion on 07/21/2019.  He received Aranesp 300 mcg 07/24/2019, 08/07/2019, and 08/21/2019.  During the interim, he's been "ok".  Christmas felt dull due not being able to see his family.  He got to see a few family members at a time to [employer]. He still tries to walk every day. His blood sugar was elevated to 197-198 due to eating many sweets at Christmas time.  He denies any bruising or bleeding.  He has had no interval infections.  He is taking calcium 1800 mg a day.  Weight is up and down 3 pounds.  He has a good appetite.   Past Medical History:  Diagnosis Date  . Anemia   . Arthritis   . BPH (benign prostatic hypertrophy)   . Complication of anesthesia    nausea  . Diabetes type 2, controlled ([place])   . HOH (hard of hearing)    r and L ears-70% loss per pt  . Hyperlipidemia   . Hypertension     Past Surgical History:  Procedure Laterality Date  . APPENDECTOMY    . COLONOSCOPY  09/21/08   1 polyp found, tubular adenoma  . HAND SURGERY    . KNEE SURGERY Left     Family History  Problem Relation Age of Onset  . Aneurysm Mother   . Heart disease Father   . Cancer Brother        skin  . Heart disease Brother   . Heart disease Brother   . Cancer Sister        skin  . Aneurysm Brother   . Heart disease Brother   . Heart disease Sister   .  Heart disease Sister   . COPD Sister   . Diabetes Sister     Social History:  reports that he has never smoked. He has never used smokeless tobacco. He reports that he does not drink alcohol or use drugs. 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 Patient denies known exposures to radiation on toxins. 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 He had his 38-year marriage anniversary on 08/23/2018.He is walking 1-68miles per day.He lives in [place]. The patient is accompanied by his wife via video today.  Allergies: No Known Allergies  Current Medications: Current Outpatient Medications  Medication Sig Dispense Refill  . aspirin 81 MG chewable tablet Chew 81 mg by mouth daily.    . Cranberry (THERACRAN PO) Take 1 tablet by mouth daily.     . cyanocobalamin 1000 MCG tablet Take 1,000 mcg by mouth daily.    . finasteride (PROSCAR) 5 MG tablet Take 5 mg by mouth daily.    Marland Kitchen glimepiride (AMARYL) 1 MG tablet Take 1 tablet by mouth daily after breakfast.    . glucose blood test strip FreeStyle Lite Strips    .  LANCETS ULTRA FINE MISC Lancets,Ultra Thin 26 gauge    . lisinopril (PRINIVIL,ZESTRIL) 10 MG tablet Take 10 mg by mouth daily.    . meclizine (ANTIVERT) 25 MG tablet Take 25 mg by mouth 2 (two) times daily as needed.     . metformin (FORTAMET) 1000 MG (OSM) 24 hr tablet Take 1,000 mg by mouth 2 (two) times daily with a meal.    . Multiple Vitamins-Minerals (CENTRUM SILVER PO) Take by mouth.    . Omega-3 Fatty Acids (FISH OIL PO) Take 1 tablet by mouth daily.     . Polyethylene Glycol 3350 (MIRALAX PO) Take by mouth as needed.     [name] (PROSTATE SR PO) Take 1 tablet by mouth daily.     . simvastatin (ZOCOR) 40 MG tablet Take 40 mg by mouth daily.    Marland Kitchen loperamide (IMODIUM) 2 MG capsule Take 1 capsule (2 mg total) by mouth See admin instructions. With onset of loose stool, take 4mg  followed by 2mg  every 2 hours until loose bowel movement stopped. Maximum:  16 mg/day (Patient not taking: Reported on 06/05/2019) 30 capsule 0  . ondansetron (ZOFRAN) 4 MG tablet Take 1 tablet (4 mg total) by mouth every 6 (six) hours as needed for nausea or vomiting. (Patient not taking: Reported on 08/28/2019) 30 tablet 2   No current facility-administered medications for this visit.    Review of Systems  Constitutional: Positive for weight loss (down 3 lbs). Negative for chills, diaphoresis, fever and malaise/fatigue.       Feels "good".  Active; walks daily.  HENT: Positive for hearing loss. Negative for congestion, ear pain, nosebleeds, sinus pain and sore throat.   Eyes: Negative.  Negative for blurred vision, double vision, photophobia and pain.  Respiratory: Negative.  Negative for cough, sputum production, shortness of breath and wheezing.   Cardiovascular: Negative.  Negative for chest pain, palpitations, orthopnea, leg swelling and PND.  Gastrointestinal: Negative.  Negative for abdominal pain, blood in stool, constipation, diarrhea, melena, nausea and vomiting.       Eating well.  Genitourinary: Negative.  Negative for dysuria, frequency, hematuria and urgency.  Musculoskeletal: Negative.  Negative for back pain, myalgias and neck pain.  Skin: Negative.  Negative for itching and rash.  Neurological: Negative.  Negative for dizziness, tingling, sensory change, focal weakness, weakness and headaches.  Endo/Heme/Allergies: Negative.        Diabetes.  Psychiatric/Behavioral: Negative.  Negative for depression and memory loss. The patient is not nervous/anxious and does not have insomnia.   All other systems reviewed and are negative.  Performance status (ECOG): 0 - Asymptomatic  Vitals Blood pressure (!) 135/43, pulse 73, temperature 98.9 F (37.2 C), temperature source Tympanic, weight 177 lb 12.8 oz (80.7 kg), SpO2 100 %.   Physical Exam  Constitutional: He is oriented to person, place, and time. He appears well-developed and well-nourished. No  distress.  HENT:  Head: Normocephalic and atraumatic.  Mouth/Throat: Oropharynx is clear and moist. No oropharyngeal exudate.  Sparse short gray hair.  Near alopecia/male pattern baldness.  Mask.  Hearing aid.    Eyes: Pupils are equal, round, and reactive to light. Conjunctivae and EOM are normal. No scleral icterus.  Gold rimmed glasses.  Blue eyes.  Neck: No JVD present.  Cardiovascular: Normal rate, regular rhythm and normal heart sounds. Exam reveals no gallop.  No murmur heard. Pulmonary/Chest: Effort normal and breath sounds normal. No respiratory distress. He has no wheezes. He has no rales.  Abdominal:  Soft. Bowel sounds are normal. He exhibits no distension and no mass. There is no abdominal tenderness. There is no rebound and no guarding.  Umbilical hernia.  Musculoskeletal:        General: No tenderness or edema. Normal range of motion.     Cervical back: Normal range of motion and neck supple.  Lymphadenopathy:       Head (right side): No preauricular, no posterior auricular and no occipital adenopathy present.       Head (left side): No preauricular, no posterior auricular and no occipital adenopathy present.    He has no cervical adenopathy.    He has no axillary adenopathy.       Right: No inguinal and no supraclavicular adenopathy present.       Left: No inguinal and no supraclavicular adenopathy present.  Neurological: He is alert and oriented to person, place, and time. He has normal reflexes.  Skin: Skin is warm and dry. No rash noted. He is not diaphoretic. No erythema. No pallor.  Psychiatric: He has a normal mood and affect. His behavior is normal. Judgment and thought content normal.  Nursing note and vitals reviewed.   Appointment on 08/28/2019  Component Date Value Ref Range Status  . WBC 08/28/2019 4.0  4.0 - 10.5 K/uL Final  . RBC 08/28/2019 2.87* 4.22 - 5.81 MIL/uL Final  . Hemoglobin 08/28/2019 7.5* 13.0 - 17.0 g/dL Final  . HCT 08/28/2019 25.8* 39.0 -  52.0 % Final  . MCV 08/28/2019 89.9  80.0 - 100.0 fL Final  . MCH 08/28/2019 26.1  26.0 - 34.0 pg Final  . MCHC 08/28/2019 29.1* 30.0 - 36.0 g/dL Final  . RDW 08/28/2019 17.4* 11.5 - 15.5 % Final  . Platelets 08/28/2019 241  150 - 400 K/uL Final  . nRBC 08/28/2019 0.7* 0.0 - 0.2 % Final  . Neutrophils Relative % 08/28/2019 48  % Final  . Neutro Abs 08/28/2019 1.9  1.7 - 7.7 K/uL Final  . Lymphocytes Relative 08/28/2019 41  % Final  . Lymphs Abs 08/28/2019 1.7  0.7 - 4.0 K/uL Final  . Monocytes Relative 08/28/2019 10  % Final  . Monocytes Absolute 08/28/2019 0.4  0.1 - 1.0 K/uL Final  . Eosinophils Relative 08/28/2019 0  % Final  . Eosinophils Absolute 08/28/2019 0.0  0.0 - 0.5 K/uL Final  . Basophils Relative 08/28/2019 0  % Final  . Basophils Absolute 08/28/2019 0.0  0.0 - 0.1 K/uL Final  . Immature Granulocytes 08/28/2019 1  % Final  . Abs Immature Granulocytes 08/28/2019 0.05  0.00 - 0.07 K/uL Final   Performed at [facility], [address] [place], [place] [address]  . Sodium 08/28/2019 133* 135 - 145 mmol/L Final  . Potassium 08/28/2019 4.5  3.5 - 5.1 mmol/L Final  . Chloride 08/28/2019 103  98 - 111 mmol/L Final  . CO2 08/28/2019 23  22 - 32 mmol/L Final  . Glucose, Bld 08/28/2019 186* 70 - 99 mg/dL Final  . BUN 08/28/2019 26* 8 - 23 mg/dL Final  . Creatinine, Ser 08/28/2019 1.00  0.61 - 1.24 mg/dL Final  . Calcium 08/28/2019 8.7* 8.9 - 10.3 mg/dL Final  . Total Protein 08/28/2019 7.1  6.5 - 8.1 g/dL Final  . Albumin 08/28/2019 4.2  3.5 - 5.0 g/dL Final  . AST 08/28/2019 19  15 - 41 U/L Final  . ALT 08/28/2019 16  0 - 44 U/L Final  . Alkaline Phosphatase 08/28/2019 28* 38 - 126  U/L Final  . Total Bilirubin 08/28/2019 0.7  0.3 - 1.2 mg/dL Final  . GFR calc non Af Amer 08/28/2019 >60  >60 mL/min Final  . GFR calc Af Amer 08/28/2019 >60  >60 mL/min Final  . Anion gap 08/28/2019 7  5 - 15 Final   Performed at [facility], [address],  [place], [place] [address]    Assessment:  Asberry Helmick. is a 85 y.o. 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 Bone marrowon 09/22/2017 revealed a hypercellular for age with dyspoietic changes variably involving myeloid cell lines, but with main involvement of the granulocytic/monocytic cell line. This was associated with bone marrow monocytosis and borderline number to slight increase in blastic cells. The overall changes favor a primary myeloid neoplasm, particularly a myelodysplastic syndrome especially refractory anemia with excess blasts (RAEB-1) or possibly refractory cytopenia with multilineage dysplasia. Consideration was also given to an evolving myelodysplastic/myeloproliferative neoplasm such as chronic myelomonocytic leukemia but the lack of absolute peripheral monocytosis precluded such a diagnosis at this time. Flow cytometrywas negative. Cytogeneticswere normal (46, XY). Foundation one was positive for ASXL1 [id number], EZH2 Splice site [id number], RUNX1 [id number]. IPSS-R4.5, intermediate group.  Anemia work-up on 08/17/2017: Vitamin B12 (658), folate (22.0), and TSH (2.688). Retic was 2.6%. Haptoglobin was 116 on 08/19/2017. Epo levelwas 104.8 on 10/05/2017.  Ferritin has been followed: 106 on 08/17/2017, 131 on 11/04/2017, 144 on 05/30/2018, 394 on 09/01/2018, 441 on 11/28/2018, 394 on 01/02/2019, 480 on 02/06/2019, 392 on 03/13/2019, 527 on 04/17/2019, 533 on 05/29/2019, 476 on 06/05/2019, and 517 on 07/17/2019. Iron saturation was 34% on 08/17/2017 and 89% on 09/01/2018.  Bone marrowon 09/22/2017 revealed a hypercellular for age with dyspoietic changes variably involving myeloid cell lines, but with main involvement of the granulocytic/monocytic cell line. This was associated with bone marrow monocytosis and borderline number to slight increase in blasts (5%). The overall changes favor a primary myeloid neoplasm, particularly a myelodysplastic syndrome especially refractory anemia with excess blasts  (RAEB-1) or possibly refractory cytopenia with multilineage dysplasia. Consideration was also given to an evolving myelodysplastic/myeloproliferative neoplasm such as chronic myelomonocytic leukemia but the lack of absolute peripheral monocytosis precluded such a diagnosis at this time. Flow cytometrywas negative. Cytogeneticswere normal (46, XY). Foundation One was positive for ASXL1 [id number], EZH2 Splice site [id number], RUNX1 [id number].  He has received 18cycles ofVidaza(11/04/2017 - 08/22/2018; 10/24/2018- 07/17/2019). He has received Aranesp(initially 150 mcg every 2 weeks post chemotherapy then 300 mcg every 1-2 weeks after cycle #3; last 09/19/2018). He last received Aranesp on 08/21/2019. He has received 1-2 units of PRBCswith each cycle (last 07/21/2019).He has received22 units of PRBCs since 08/17/2017.  Bone marrowon 08/12/2018 revealed a hypercellular marrow with dyspoietic changes. There was a borderline number of blasts (5%) seen by morphology. The findings were similar to previous biopsy although the cellularity is slightly higher in the current material.consistent with previously known myelodysplastic syndrome. Flow cytometry was negative. Cytogenetics were normal (46, XY).  Symptomatically, he feels "ok".  He denies any B symptoms, brusing or bleeding or interval infections.  He remains active.  Plan: 1.   Labs today:CBC with diff, CMP, ferritin, hold tube. 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  He tolerated the last cycle of Vidaza without side effects.   He has received 18 cycles of Vidaza (last 07/17/2019).  Treatment goal remains to decrease transfusion dependence and prevent acceleration of disease (blasts). Review counts today. WBC 400 with an ANC of 1900. Discuss 4 day week  this week secondary to [employer] day.   Discuss a truncated 4 day  treatment cycle or postponing treatment until next week.   Decision made to postpone treatment. He last received Aranesp on 07/10/2019.  Review trend in hemoglobin (similar to last cycle).   Discuss transfusion of 1 unit of PRBCs this week. Continue Aranesp every 2 weeks to decrease transfusion dependence. 3. Iron overload [phone] today. He has received22units of PRBCs to date (08/17/2017 - 07/21/2019). Anticipate anticipate initiation of an oral chelating agentif ferritin > 1000. 4.   No treatment this week. 5.   RTC on 08/30/2019 for 1 unit PRBC. 6.   RTC on 09/04/2019 for MD assessment, labs (CBC with diff, CMP), Aranesp and cycle #19 Vidaza.  I discussed the assessment and treatment plan with the patient.  The patient was provided an opportunity to ask questions and all were answered.  The patient agreed with the plan and demonstrated an understanding of the instructions.  The patient was advised to call back if the symptoms worsen or if the condition fails to improve as anticipated.  I provided 19 minutes (2:11 PM - 2:30 PM) of face-to-face time during this this encounter and > 50% was spent counseling as documented under my assessment and plan.    Lequita Asal, MD, PhD    08/28/2019, 2:30 PM  I, Samul Dada, am acting as a scribe for Lequita Asal, MD.  I, Girard Mike Gip, MD, have reviewed the above documentation for accuracy and completeness, and I agree with the above.

## 2019-08-28 ENCOUNTER — Other Ambulatory Visit: Payer: Self-pay

## 2019-08-28 ENCOUNTER — Encounter: Payer: Self-pay | Admitting: Hematology and Oncology

## 2019-08-28 ENCOUNTER — Inpatient Hospital Stay (HOSPITAL_BASED_OUTPATIENT_CLINIC_OR_DEPARTMENT_OTHER): Payer: Medicare Other | Admitting: Hematology and Oncology

## 2019-08-28 ENCOUNTER — Inpatient Hospital Stay: Payer: Medicare Other

## 2019-08-28 ENCOUNTER — Other Ambulatory Visit: Payer: Self-pay | Admitting: Hematology and Oncology

## 2019-08-28 VITALS — BP 135/43 | HR 73 | Temp 98.9°F | Wt 177.8 lb

## 2019-08-28 DIAGNOSIS — D469 Myelodysplastic syndrome, unspecified: Secondary | ICD-10-CM

## 2019-08-28 DIAGNOSIS — Z7189 Other specified counseling: Secondary | ICD-10-CM | POA: Diagnosis not present

## 2019-08-28 DIAGNOSIS — D649 Anemia, unspecified: Secondary | ICD-10-CM

## 2019-08-28 DIAGNOSIS — D4621 Refractory anemia with excess of blasts 1: Secondary | ICD-10-CM | POA: Diagnosis not present

## 2019-08-28 DIAGNOSIS — D630 Anemia in neoplastic disease: Secondary | ICD-10-CM

## 2019-08-28 LAB — CBC WITH DIFFERENTIAL/PLATELET
Abs Immature Granulocytes: 0.05 10*3/uL (ref 0.00–0.07)
Basophils Absolute: 0 10*3/uL (ref 0.0–0.1)
Basophils Relative: 0 %
Eosinophils Absolute: 0 10*3/uL (ref 0.0–0.5)
Eosinophils Relative: 0 %
HCT: 25.8 % — ABNORMAL LOW (ref 39.0–52.0)
Hemoglobin: 7.5 g/dL — ABNORMAL LOW (ref 13.0–17.0)
Immature Granulocytes: 1 %
Lymphocytes Relative: 41 %
Lymphs Abs: 1.7 10*3/uL (ref 0.7–4.0)
MCH: 26.1 pg (ref 26.0–34.0)
MCHC: 29.1 g/dL — ABNORMAL LOW (ref 30.0–36.0)
MCV: 89.9 fL (ref 80.0–100.0)
Monocytes Absolute: 0.4 10*3/uL (ref 0.1–1.0)
Monocytes Relative: 10 %
Neutro Abs: 1.9 10*3/uL (ref 1.7–7.7)
Neutrophils Relative %: 48 %
Platelets: 241 10*3/uL (ref 150–400)
RBC: 2.87 MIL/uL — ABNORMAL LOW (ref 4.22–5.81)
RDW: 17.4 % — ABNORMAL HIGH (ref 11.5–15.5)
WBC: 4 10*3/uL (ref 4.0–10.5)
nRBC: 0.7 % — ABNORMAL HIGH (ref 0.0–0.2)

## 2019-08-28 LAB — COMPREHENSIVE METABOLIC PANEL
ALT: 16 U/L (ref 0–44)
AST: 19 U/L (ref 15–41)
Albumin: 4.2 g/dL (ref 3.5–5.0)
Alkaline Phosphatase: 28 U/L — ABNORMAL LOW (ref 38–126)
Anion gap: 7 (ref 5–15)
BUN: 26 mg/dL — ABNORMAL HIGH (ref 8–23)
CO2: 23 mmol/L (ref 22–32)
Calcium: 8.7 mg/dL — ABNORMAL LOW (ref 8.9–10.3)
Chloride: 103 mmol/L (ref 98–111)
Creatinine, Ser: 1 mg/dL (ref 0.61–1.24)
GFR calc Af Amer: >60 mL/min (ref >60)
GFR calc non Af Amer: >60 mL/min (ref >60)
Glucose, Bld: 186 mg/dL — ABNORMAL HIGH (ref 70–99)
Potassium: 4.5 mmol/L (ref 3.5–5.1)
Sodium: 133 mmol/L — ABNORMAL LOW (ref 135–145)
Total Bilirubin: 0.7 mg/dL (ref 0.3–1.2)
Total Protein: 7.1 g/dL (ref 6.5–8.1)

## 2019-08-28 LAB — FERRITIN: Ferritin: 685 ng/mL — ABNORMAL HIGH (ref 24–336)

## 2019-08-28 NOTE — Progress Notes (Signed)
No new changes noted today 

## 2019-08-29 ENCOUNTER — Inpatient Hospital Stay: Payer: Medicare Other

## 2019-08-29 ENCOUNTER — Other Ambulatory Visit: Payer: Self-pay

## 2019-08-29 LAB — SAMPLE TO BLOOD BANK

## 2019-08-29 LAB — PREPARE RBC (CROSSMATCH)

## 2019-08-30 ENCOUNTER — Inpatient Hospital Stay: Payer: Medicare Other

## 2019-08-30 DIAGNOSIS — D4621 Refractory anemia with excess of blasts 1: Secondary | ICD-10-CM | POA: Diagnosis not present

## 2019-08-30 DIAGNOSIS — D469 Myelodysplastic syndrome, unspecified: Secondary | ICD-10-CM

## 2019-08-30 MED ORDER — DIPHENHYDRAMINE HCL 25 MG PO CAPS: 25.0000 mg | ORAL_CAPSULE | Freq: Once | ORAL | Status: DC

## 2019-08-30 MED ORDER — SODIUM CHLORIDE 0.9% IV SOLUTION
250.0000 mL | Freq: Once | INTRAVENOUS | Status: AC
  Administered 2019-08-30: 10:00:00 250 mL via INTRAVENOUS
  Filled 2019-08-30: qty 250

## 2019-08-30 MED ORDER — ACETAMINOPHEN 325 MG PO TABS: 650.0000 mg | ORAL_TABLET | Freq: Once | ORAL | Status: DC

## 2019-08-30 NOTE — Progress Notes (Signed)
Tolerated Blood Transfusion well. 1 unit PRBC's. Patient denies any discomfort at time of discharge. Ambulatory.

## 2019-08-30 NOTE — Patient Instructions (Signed)
Blood Transfusion, Adult, Care After This sheet gives you information about how to care for yourself after your procedure. Your doctor may also give you more specific instructions. If you have problems or questions, contact your doctor. Follow these instructions at home:   Take over-the-counter and prescription medicines only as told by your doctor.  Go back to your normal activities as told by your doctor.  Follow instructions from your doctor about how to take care of the area where an IV tube was put into your vein (insertion site). Make sure you: ? Wash your hands with soap and water before you change your bandage (dressing). If there is no soap and water, use hand sanitizer. ? Change your bandage as told by your doctor.  Check your IV insertion site every day for signs of infection. Check for: ? More redness, swelling, or pain. ? More fluid or blood. ? Warmth. ? Pus or a bad smell. Contact a doctor if:  You have more redness, swelling, or pain around the IV insertion site.  You have more fluid or blood coming from the IV insertion site.  Your IV insertion site feels warm to the touch.  You have pus or a bad smell coming from the IV insertion site.  Your pee (urine) turns pink, red, or brown.  You feel weak after doing your normal activities. Get help right away if:  You have signs of a serious allergic or body defense (immune) system reaction, including: ? Itchiness. ? Hives. ? Trouble breathing. ? Anxiety. ? Pain in your chest or lower back. ? Fever, flushing, and chills. ? Fast pulse. ? Rash. ? Watery poop (diarrhea). ? Throwing up (vomiting). ? Dark pee. ? Serious headache. ? Dizziness. ? Stiff neck. ? Yellow color in your face or the white parts of your eyes (jaundice). Summary  After a blood transfusion, return to your normal activities as told by your doctor.  Every day, check for signs of infection where the IV tube was put into your vein.  Some  signs of infection are warm skin, more redness and pain, more fluid or blood, and pus or a bad smell where the needle went in.  Contact your doctor if you feel weak or have any unusual symptoms. This information is not intended to replace advice given to you by your health care provider. Make sure you discuss any questions you have with your health care provider. Document Released: 09/07/2014 Document Revised: 12/22/2017 Document Reviewed: 04/10/2016 Elsevier Patient Education  2020 Elsevier Inc.  

## 2019-08-31 ENCOUNTER — Inpatient Hospital Stay: Payer: Medicare Other

## 2019-08-31 LAB — TYPE AND SCREEN
ABO/RH(D): B POS
Antibody Screen: NEGATIVE
Unit division: 0

## 2019-08-31 LAB — BPAM RBC
Blood Product Expiration Date: 202101052359
ISSUE DATE / TIME: 202012301056
Unit Type and Rh: 9500

## 2019-09-02 NOTE — Progress Notes (Signed)
[facility]  [address] [place], [place] [address] Phone: [phone]  Fax: [phone]   Clinic Day:  09/04/2019  Referring physician: Tracie Harrier, MD  Chief Complaint: Paul Valen. is a 86 y.o. 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  HPI: The patient was last seen in the hematology clinic on 08/28/2019. At that time, he was doing well.  He denied any B symptoms, bruising or bleeding or interval infections.  He remained active. Hematocrit was 25.8, hemoglobin 7.5, MCV 89.9, platelets 241,000, and WBC 4000 (ANC 1900).  Ferritin was 685. Given the 4 day week because of New Year's Day, decision was made to postpone treatment one week.  He received 1 unit of PRBC on 08/30/2019.  During the interim, he has felt "ok".  His wife notes that he doesn't have as much energy as he has had in the past but the infusion did help him greatly.  He is still doing his evening walks.  He checked his blood sugars yesterday and it was 158.  He denies any pain.  He would like to have his granddaughter attend his appointment next time via video.   Past Medical History:  Diagnosis Date  . Anemia   . Arthritis   . BPH (benign prostatic hypertrophy)   . Complication of anesthesia    nausea  . Diabetes type 2, controlled ([place])   . HOH (hard of hearing)    r and L ears-70% loss per pt  . Hyperlipidemia   . Hypertension     Past Surgical History:  Procedure Laterality Date  . APPENDECTOMY    . COLONOSCOPY  09/21/08   1 polyp found, tubular adenoma  . HAND SURGERY    . KNEE SURGERY Left     Family History  Problem Relation Age of Onset  . Aneurysm Mother   . Heart disease Father   . Cancer Brother        skin  . Heart disease Brother   . Heart disease Brother   . Cancer Sister        skin  . Aneurysm Brother   . Heart disease Brother   . Heart disease Sister   . Heart disease Sister   . COPD Sister   .  Diabetes Sister     Social History:  reports that he has never smoked. He has never used smokeless tobacco. He reports that he does not drink alcohol or use drugs. 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 Patient denies known exposures to radiation on toxins. 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 He had his 63-year marriage anniversary on 08/23/2018.He is walking 1-51miles per day.He lives in [place].  The patient is accompanied by his wife Inez Catalina via [place] today.  Allergies: No Known Allergies  Current Medications: Current Outpatient Medications  Medication Sig Dispense Refill  . aspirin 81 MG chewable tablet Chew 81 mg by mouth daily.    . Cranberry (THERACRAN PO) Take 1 tablet by mouth daily.     . cyanocobalamin 1000 MCG tablet Take 1,000 mcg by mouth daily.    . finasteride (PROSCAR) 5 MG tablet Take 5 mg by mouth daily.    Marland Kitchen glimepiride (AMARYL) 1 MG tablet Take 1 tablet by mouth daily after breakfast.    . glucose blood test strip FreeStyle Lite Strips    . LANCETS ULTRA FINE MISC Lancets,Ultra Thin 26 gauge    .  lisinopril (PRINIVIL,ZESTRIL) 10 MG tablet Take 10 mg by mouth daily.    . meclizine (ANTIVERT) 25 MG tablet Take 25 mg by mouth 2 (two) times daily as needed.     . metformin (FORTAMET) 1000 MG (OSM) 24 hr tablet Take 1,000 mg by mouth 2 (two) times daily with a meal.    . Multiple Vitamins-Minerals (CENTRUM SILVER PO) Take by mouth.    . Omega-3 Fatty Acids (FISH OIL PO) Take 1 tablet by mouth daily.     . Polyethylene Glycol 3350 (MIRALAX PO) Take by mouth as needed.     [name] (PROSTATE SR PO) Take 1 tablet by mouth daily.     . simvastatin (ZOCOR) 40 MG tablet Take 40 mg by mouth daily.    Marland Kitchen loperamide (IMODIUM) 2 MG capsule Take 1 capsule (2 mg total) by mouth See admin instructions. With onset of loose stool, take 4mg  followed by 2mg  every 2 hours until loose bowel movement stopped. Maximum: 16 mg/day (Patient not taking:  Reported on 06/05/2019) 30 capsule 0  . ondansetron (ZOFRAN) 4 MG tablet Take 1 tablet (4 mg total) by mouth every 6 (six) hours as needed for nausea or vomiting. (Patient not taking: Reported on 08/28/2019) 30 tablet 2   No current facility-administered medications for this visit.   Facility-Administered Medications Ordered in Other Visits  Medication Dose Route Frequency Provider Last Rate Last Admin  . azaCITIDine (VIDAZA) chemo injection 155 mg  75 mg/m2 (Treatment Plan Recorded) Subcutaneous Once Natalie Mceuen C, MD      . ondansetron (ZOFRAN) tablet 8 mg  8 mg Oral Once Lequita Asal, MD        Review of Systems  Constitutional: Negative for chills, diaphoresis, fever, malaise/fatigue and weight loss (4 lb gain).       Feels "ok".  Active; walks daily.  HENT: Positive for hearing loss. Negative for congestion, ear pain, nosebleeds, sinus pain and sore throat.   Eyes: Negative.  Negative for blurred vision, double vision, photophobia and pain.  Respiratory: Negative.  Negative for cough, sputum production, shortness of breath and wheezing.   Cardiovascular: Negative.  Negative for chest pain, palpitations, orthopnea, leg swelling and PND.  Gastrointestinal: Positive for constipation (little, takes Miralax). Negative for abdominal pain, blood in stool, diarrhea, melena, nausea and vomiting.       Eating well.  Genitourinary: Negative.  Negative for dysuria, frequency, hematuria and urgency.  Musculoskeletal: Negative.  Negative for back pain, myalgias and neck pain.  Skin: Negative.  Negative for itching and rash.  Neurological: Negative.  Negative for dizziness, tingling, sensory change, focal weakness, weakness and headaches.  Endo/Heme/Allergies: Negative.        Diabetes.  Psychiatric/Behavioral: Negative.  Negative for depression and memory loss. The patient is not nervous/anxious and does not have insomnia.   All other systems reviewed and are negative.  Performance  status (ECOG): 0  Vitals Blood pressure (!) 148/47, pulse 76, temperature (!) 97.2 F (36.2 C), temperature source Tympanic, resp. rate 18, height 6\' 1"  (1.854 m), weight 181 lb 3.5 oz (82.2 kg), SpO2 100 %.   Physical Exam  Constitutional: He is oriented to person, place, and time. He appears well-developed and well-nourished. No distress.  HENT:  Head: Normocephalic and atraumatic.  Mouth/Throat: Oropharynx is clear and moist. No oropharyngeal exudate.  Sparse short gray hair.  Near alopecia/male pattern baldness.  Mask.  Hearing aid.    Eyes: Pupils are equal, round, and reactive to light.  Conjunctivae and EOM are normal. No scleral icterus.  Gold rimmed glasses.  Blue eyes.  Neck: No JVD present.  Cardiovascular: Normal rate, regular rhythm and normal heart sounds. Exam reveals no gallop.  No murmur heard. Pulmonary/Chest: Effort normal and breath sounds normal. No respiratory distress. He has no wheezes. He has no rales.  Abdominal: Soft. Bowel sounds are normal. He exhibits no distension and no mass. There is no abdominal tenderness. There is no rebound and no guarding.  Umbilical hernia.  Musculoskeletal:        General: No tenderness or edema. Normal range of motion.     Cervical back: Normal range of motion and neck supple.  Lymphadenopathy:       Head (right side): No preauricular, no posterior auricular and no occipital adenopathy present.       Head (left side): No preauricular, no posterior auricular and no occipital adenopathy present.    He has no cervical adenopathy.    He has no axillary adenopathy.       Right: No inguinal and no supraclavicular adenopathy present.       Left: No inguinal and no supraclavicular adenopathy present.  Neurological: He is alert and oriented to person, place, and time. He has normal reflexes.  Skin: Skin is warm and dry. No rash noted. He is not diaphoretic. No erythema. No pallor.  Psychiatric: He has a normal mood and affect. His  behavior is normal. Judgment and thought content normal.  Nursing note and vitals reviewed.   Appointment on 09/04/2019  Component Date Value Ref Range Status  . Sodium 09/04/2019 136  135 - 145 mmol/L Final  . Potassium 09/04/2019 4.4  3.5 - 5.1 mmol/L Final  . Chloride 09/04/2019 103  98 - 111 mmol/L Final  . CO2 09/04/2019 26  22 - 32 mmol/L Final  . Glucose, Bld 09/04/2019 182* 70 - 99 mg/dL Final  . BUN 09/04/2019 21  8 - 23 mg/dL Final  . Creatinine, Ser 09/04/2019 0.91  0.61 - 1.24 mg/dL Final  . Calcium 09/04/2019 8.6* 8.9 - 10.3 mg/dL Final  . Total Protein 09/04/2019 7.1  6.5 - 8.1 g/dL Final  . Albumin 09/04/2019 4.2  3.5 - 5.0 g/dL Final  . AST 09/04/2019 18  15 - 41 U/L Final  . ALT 09/04/2019 15  0 - 44 U/L Final  . Alkaline Phosphatase 09/04/2019 28* 38 - 126 U/L Final  . Total Bilirubin 09/04/2019 0.8  0.3 - 1.2 mg/dL Final  . GFR calc non Af Amer 09/04/2019 >60  >60 mL/min Final  . GFR calc Af Amer 09/04/2019 >60  >60 mL/min Final  . Anion gap 09/04/2019 7  5 - 15 Final   Performed at [facility], [address] [place], [place] [address]  . WBC 09/04/2019 4.5  4.0 - 10.5 K/uL Final  . RBC 09/04/2019 3.13* 4.22 - 5.81 MIL/uL Final  . Hemoglobin 09/04/2019 8.4* 13.0 - 17.0 g/dL Final  . HCT 09/04/2019 28.2* 39.0 - 52.0 % Final  . MCV 09/04/2019 90.1  80.0 - 100.0 fL Final  . MCH 09/04/2019 26.8  26.0 - 34.0 pg Final  . MCHC 09/04/2019 29.8* 30.0 - 36.0 g/dL Final  . RDW 09/04/2019 16.9* 11.5 - 15.5 % Final  . Platelets 09/04/2019 167  150 - 400 K/uL Final  . nRBC 09/04/2019 0.0  0.0 - 0.2 % Final  . Neutrophils Relative % 09/04/2019 52  % Final  . Neutro Abs 09/04/2019 2.3  1.7 - 7.7 K/uL Final  . Lymphocytes Relative 09/04/2019 34  % Final  . Lymphs Abs 09/04/2019 1.5  0.7 - 4.0 K/uL Final  . Monocytes Relative 09/04/2019 13  % Final  . Monocytes Absolute 09/04/2019 0.6  0.1 - 1.0 K/uL Final  . Eosinophils Relative 09/04/2019 0  % Final  .  Eosinophils Absolute 09/04/2019 0.0  0.0 - 0.5 K/uL Final  . Basophils Relative 09/04/2019 0  % Final  . Basophils Absolute 09/04/2019 0.0  0.0 - 0.1 K/uL Final  . Immature Granulocytes 09/04/2019 1  % Final  . Abs Immature Granulocytes 09/04/2019 0.06  0.00 - 0.07 K/uL Final   Performed at [facility], [address] [place], [place] [address]    Assessment:  Paul Ossman. is a 86 y.o. 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 Bone marrowon 09/22/2017 revealed a hypercellular for age with dyspoietic changes variably involving myeloid cell lines, but with main involvement of the granulocytic/monocytic cell line. This was associated with bone marrow monocytosis and borderline number to slight increase in blastic cells. The overall changes favor a primary myeloid neoplasm, particularly a myelodysplastic syndrome especially refractory anemia with excess blasts (RAEB-1) or possibly refractory cytopenia with multilineage dysplasia. Consideration was also given to an evolving myelodysplastic/myeloproliferative neoplasm such as chronic myelomonocytic leukemia but the lack of absolute peripheral monocytosis precluded such a diagnosis at this time. Flow cytometrywas negative. Cytogeneticswere normal (46, XY). Foundation one was positive for ASXL1 [id number], EZH2 Splice site [id number], RUNX1 [id number]. IPSS-R4.5, intermediate group.  Anemia work-up on 08/17/2017: Vitamin B12 (658), folate (22.0), and TSH (2.688). Retic was 2.6%. Haptoglobin was 116 on 08/19/2017. Epo levelwas 104.8 on 10/05/2017.  Ferritin has been followed: 106 on 08/17/2017, 131 on 11/04/2017, 144 on 05/30/2018, 394 on 09/01/2018, 441 on 11/28/2018, 394 on 01/02/2019, 480 on 02/06/2019, 392 on 03/13/2019, 527 on 04/17/2019, 533 on 05/29/2019, 476 on 06/05/2019, 517 on 07/17/2019, and 685 on 08/28/2019. Iron saturation was 34% on 08/17/2017 and 89% on 09/01/2018.  Bone marrowon 09/22/2017 revealed a hypercellular for age with dyspoietic  changes variably involving myeloid cell lines, but with main involvement of the granulocytic/monocytic cell line. This was associated with bone marrow monocytosis and borderline number to slight increase in blasts (5%). The overall changes favor a primary myeloid neoplasm, particularly a myelodysplastic syndrome especially refractory anemia with excess blasts (RAEB-1) or possibly refractory cytopenia with multilineage dysplasia. Consideration was also given to an evolving myelodysplastic/myeloproliferative neoplasm such as chronic myelomonocytic leukemia but the lack of absolute peripheral monocytosis precluded such a diagnosis at this time. Flow cytometrywas negative. Cytogeneticswere normal (46, XY). Foundation One was positive for ASXL1 [id number], EZH2 Splice site [id number], RUNX1 [id number].  He has received 18cycles ofVidaza(11/04/2017 - 08/22/2018; 10/24/2018-07/17/2019). He has received Aranesp(initially 150 mcg every 2 weeks post chemotherapy then 300 mcg every 1-2 weeks after cycle #3; last 09/19/2018). He last received Aranesp on12/21/2020. He has received 1-2 units of PRBCswith each cycle (last12/30/2020).He has received23 units of PRBCs since 08/17/2017.  Bone marrowon 08/12/2018 revealed a hypercellular marrow with dyspoietic changes. There was a borderline number of blasts (5%) seen by morphology. The findings were similar to previous biopsy although the cellularity is slightly higher in the current material.consistent with previously known myelodysplastic syndrome. Flow cytometry was negative. Cytogenetics were normal (46, XY).  Symptomatically, he feels "ok".  Energy level is good.  He denies any excess bruising or bleeding.  He has had no interval infections.  Plan: 1.  Labs today:CBC with diff, CMP. 2. Refractory  anemia with excess blasts (RAEB-1) Clinically, he is doing well  He continues to receive Vidaza every 5-6 weeks without side  effects.  Treatmentgoal is todecrease transfusion dependence and prevent acceleration of disease (blasts). Review counts today. [id number] an Grat[place] of 1700. Begin cycle #19Vidazaday 1-5today. He last received Aranesp on12/21/2020. Continue Aranesp every 2 weeks to decrease transfusion dependence.  3. Iron overload [id number] on 08/28/2019. He has received23units of PRBCs to date (08/17/2017 -08/30/2019). Plan to initiate an oral chelating agent such as Exjade or Jadenu if ferritin > 1000. 4.   Begin cycle #19 Vidaza day 1-5 this week. 5.   Aranesp today. 6.   RTC in 1 weeks for labs (CBC with diff, hold tube). 7.   RTC in 2 weeks for labs (CBC with diff, hold tube), and +/- Aranesp. 8.   RTC in4weeks for labs (CBC with diff, hold tube), and +/- Aranesp. 9.   RTC in6weeks for MD assessment, labs (CBC with diff, CMP, ferritin, hold tube) and cycle #20 Vidaza (day 1-5).  I discussed the assessment and treatment plan with the patient.  The patient was provided an opportunity to ask questions and all were answered.  The patient agreed with the plan and demonstrated an understanding of the instructions.  The patient was advised to call back if the symptoms worsen or if the condition fails to improve as anticipated.   Lequita Asal, MD, PhD    09/04/2019, 10:45 AM  I, Samul Dada, am acting as a scribe for Lequita Asal, MD.  I, Toast Mike Gip, MD, have reviewed the above documentation for accuracy and completeness, and I agree with the above.

## 2019-09-04 ENCOUNTER — Inpatient Hospital Stay: Payer: Medicare Other | Attending: Hematology and Oncology

## 2019-09-04 ENCOUNTER — Encounter: Payer: Self-pay | Admitting: Hematology and Oncology

## 2019-09-04 ENCOUNTER — Inpatient Hospital Stay: Payer: Medicare Other

## 2019-09-04 ENCOUNTER — Inpatient Hospital Stay: Payer: Medicare Other | Attending: Hematology and Oncology | Admitting: Hematology and Oncology

## 2019-09-04 ENCOUNTER — Other Ambulatory Visit: Payer: Self-pay

## 2019-09-04 VITALS — BP 138/62 | HR 80 | Temp 97.2°F | Resp 18

## 2019-09-04 VITALS — BP 148/47 | HR 76 | Temp 97.2°F | Resp 18 | Ht 73.0 in | Wt 181.2 lb

## 2019-09-04 DIAGNOSIS — Z5111 Encounter for antineoplastic chemotherapy: Secondary | ICD-10-CM | POA: Insufficient documentation

## 2019-09-04 DIAGNOSIS — D4621 Refractory anemia with excess of blasts 1: Secondary | ICD-10-CM | POA: Insufficient documentation

## 2019-09-04 DIAGNOSIS — D469 Myelodysplastic syndrome, unspecified: Secondary | ICD-10-CM

## 2019-09-04 LAB — COMPREHENSIVE METABOLIC PANEL
ALT: 15 U/L (ref 0–44)
AST: 18 U/L (ref 15–41)
Albumin: 4.2 g/dL (ref 3.5–5.0)
Alkaline Phosphatase: 28 U/L — ABNORMAL LOW (ref 38–126)
Anion gap: 7 (ref 5–15)
BUN: 21 mg/dL (ref 8–23)
CO2: 26 mmol/L (ref 22–32)
Calcium: 8.6 mg/dL — ABNORMAL LOW (ref 8.9–10.3)
Chloride: 103 mmol/L (ref 98–111)
Creatinine, Ser: 0.91 mg/dL (ref 0.61–1.24)
GFR calc Af Amer: >60 mL/min (ref >60)
GFR calc non Af Amer: >60 mL/min (ref >60)
Glucose, Bld: 182 mg/dL — ABNORMAL HIGH (ref 70–99)
Potassium: 4.4 mmol/L (ref 3.5–5.1)
Sodium: 136 mmol/L (ref 135–145)
Total Bilirubin: 0.8 mg/dL (ref 0.3–1.2)
Total Protein: 7.1 g/dL (ref 6.5–8.1)

## 2019-09-04 LAB — CBC WITH DIFFERENTIAL/PLATELET
Abs Immature Granulocytes: 0.06 10*3/uL (ref 0.00–0.07)
Basophils Absolute: 0 10*3/uL (ref 0.0–0.1)
Basophils Relative: 0 %
Eosinophils Absolute: 0 10*3/uL (ref 0.0–0.5)
Eosinophils Relative: 0 %
HCT: 28.2 % — ABNORMAL LOW (ref 39.0–52.0)
Hemoglobin: 8.4 g/dL — ABNORMAL LOW (ref 13.0–17.0)
Immature Granulocytes: 1 %
Lymphocytes Relative: 34 %
Lymphs Abs: 1.5 10*3/uL (ref 0.7–4.0)
MCH: 26.8 pg (ref 26.0–34.0)
MCHC: 29.8 g/dL — ABNORMAL LOW (ref 30.0–36.0)
MCV: 90.1 fL (ref 80.0–100.0)
Monocytes Absolute: 0.6 10*3/uL (ref 0.1–1.0)
Monocytes Relative: 13 %
Neutro Abs: 2.3 10*3/uL (ref 1.7–7.7)
Neutrophils Relative %: 52 %
Platelets: 167 10*3/uL (ref 150–400)
RBC: 3.13 MIL/uL — ABNORMAL LOW (ref 4.22–5.81)
RDW: 16.9 % — ABNORMAL HIGH (ref 11.5–15.5)
WBC: 4.5 10*3/uL (ref 4.0–10.5)
nRBC: 0 % (ref 0.0–0.2)

## 2019-09-04 MED ORDER — AZACITIDINE CHEMO SQ INJECTION
75.0000 mg/m2 | Freq: Once | INTRAMUSCULAR | Status: AC
  Administered 2019-09-04: 11:00:00 155 mg via SUBCUTANEOUS
  Filled 2019-09-04: qty 6.2

## 2019-09-04 MED ORDER — ONDANSETRON HCL 4 MG PO TABS: 8.0000 mg | ORAL_TABLET | Freq: Once | ORAL | Status: DC

## 2019-09-04 NOTE — Progress Notes (Signed)
No new changes noted today 

## 2019-09-05 ENCOUNTER — Inpatient Hospital Stay: Payer: Medicare Other

## 2019-09-05 VITALS — BP 125/63 | HR 76 | Temp 97.9°F | Resp 18

## 2019-09-05 DIAGNOSIS — Z5111 Encounter for antineoplastic chemotherapy: Secondary | ICD-10-CM | POA: Diagnosis not present

## 2019-09-05 DIAGNOSIS — D469 Myelodysplastic syndrome, unspecified: Secondary | ICD-10-CM

## 2019-09-05 MED ORDER — ONDANSETRON HCL 4 MG PO TABS: 8.0000 mg | ORAL_TABLET | Freq: Once | ORAL | Status: DC

## 2019-09-05 MED ORDER — AZACITIDINE CHEMO SQ INJECTION
75.0000 mg/m2 | Freq: Once | INTRAMUSCULAR | Status: AC
  Administered 2019-09-05: 155 mg via SUBCUTANEOUS
  Filled 2019-09-05: qty 6.2

## 2019-09-05 NOTE — Patient Instructions (Signed)
Azacitidine suspension for injection (subcutaneous use) What is this medicine? AZACITIDINE (ay za SITE i deen) is a chemotherapy drug. This medicine reduces the growth of cancer cells and can suppress the immune system. It is used for treating myelodysplastic syndrome or some types of leukemia. This medicine may be used for other purposes; ask your health care provider or pharmacist if you have questions. COMMON BRAND NAME(S): Vidaza What should I tell my health care provider before I take this medicine? They need to know if you have any of these conditions:  kidney disease  liver disease  liver tumors  an unusual or allergic reaction to azacitidine, mannitol, other medicines, foods, dyes, or preservatives  pregnant or trying to get pregnant  breast-feeding How should I use this medicine? This medicine is for injection under the skin. It is administered in a hospital or clinic by a specially trained health care professional. Talk to your pediatrician regarding the use of this medicine in children. While this drug may be prescribed for selected conditions, precautions do apply. Overdosage: If you think you have taken too much of this medicine contact a poison control center or emergency room at once. NOTE: This medicine is only for you. Do not share this medicine with others. What if I miss a dose? It is important not to miss your dose. Call your doctor or health care professional if you are unable to keep an appointment. What may interact with this medicine? Interactions have not been studied. Give your health care provider a list of all the medicines, herbs, non-prescription drugs, or dietary supplements you use. Also tell them if you smoke, drink alcohol, or use illegal drugs. Some items may interact with your medicine. This list may not describe all possible interactions. Give your health care provider a list of all the medicines, herbs, non-prescription drugs, or dietary supplements  you use. Also tell them if you smoke, drink alcohol, or use illegal drugs. Some items may interact with your medicine. What should I watch for while using this medicine? Visit your doctor for checks on your progress. This drug may make you feel generally unwell. This is not uncommon, as chemotherapy can affect healthy cells as well as cancer cells. Report any side effects. Continue your course of treatment even though you feel ill unless your doctor tells you to stop. In some cases, you may be given additional medicines to help with side effects. Follow all directions for their use. Call your doctor or health care professional for advice if you get a fever, chills or sore throat, or other symptoms of a cold or flu. Do not treat yourself. This drug decreases your body's ability to fight infections. Try to avoid being around people who are sick. This medicine may increase your risk to bruise or bleed. Call your doctor or health care professional if you notice any unusual bleeding. You may need blood work done while you are taking this medicine. Do not become pregnant while taking this medicine and for 6 months after the last dose. Women should inform their doctor if they wish to become pregnant or think they might be pregnant. Men should not father a child while taking this medicine and for 3 months after the last dose. There is a potential for serious side effects to an unborn child. Talk to your health care professional or pharmacist for more information. Do not breast-feed an infant while taking this medicine and for 1 week after the last dose. This medicine may interfere with   the ability to have a child. Talk with your doctor or health care professional if you are concerned about your fertility. What side effects may I notice from receiving this medicine? Side effects that you should report to your doctor or health care professional as soon as possible:  allergic reactions like skin rash, itching or  hives, swelling of the face, lips, or tongue  low blood counts - this medicine may decrease the number of white blood cells, red blood cells and platelets. You may be at increased risk for infections and bleeding.  signs of infection - fever or chills, cough, sore throat, pain passing urine  signs of decreased platelets or bleeding - bruising, pinpoint red spots on the skin, black, tarry stools, blood in the urine  signs of decreased red blood cells - unusually weak or tired, fainting spells, lightheadedness  signs and symptoms of kidney injury like trouble passing urine or change in the amount of urine  signs and symptoms of liver injury like dark yellow or brown urine; general ill feeling or flu-like symptoms; light-colored stools; loss of appetite; nausea; right upper belly pain; unusually weak or tired; yellowing of the eyes or skin Side effects that usually do not require medical attention (report to your doctor or health care professional if they continue or are bothersome):  constipation  diarrhea  nausea, vomiting  pain or redness at the injection site  unusually weak or tired This list may not describe all possible side effects. Call your doctor for medical advice about side effects. You may report side effects to FDA at 1-800-FDA-1088. Where should I keep my medicine? This drug is given in a hospital or clinic and will not be stored at home. NOTE: This sheet is a summary. It may not cover all possible information. If you have questions about this medicine, talk to your doctor, pharmacist, or health care provider.  2020 Elsevier/Gold Standard (2016-09-15 14:37:51)  

## 2019-09-06 ENCOUNTER — Other Ambulatory Visit: Payer: Self-pay

## 2019-09-06 ENCOUNTER — Inpatient Hospital Stay: Payer: Medicare Other

## 2019-09-06 ENCOUNTER — Telehealth: Payer: Self-pay

## 2019-09-06 VITALS — BP 143/57 | HR 80 | Temp 98.5°F

## 2019-09-06 DIAGNOSIS — Z5111 Encounter for antineoplastic chemotherapy: Secondary | ICD-10-CM | POA: Diagnosis not present

## 2019-09-06 DIAGNOSIS — D469 Myelodysplastic syndrome, unspecified: Secondary | ICD-10-CM

## 2019-09-06 MED ORDER — AZACITIDINE CHEMO SQ INJECTION
75.0000 mg/m2 | Freq: Once | INTRAMUSCULAR | Status: AC
  Administered 2019-09-06: 155 mg via SUBCUTANEOUS
  Filled 2019-09-06: qty 6.2

## 2019-09-06 MED ORDER — ONDANSETRON HCL 4 MG PO TABS: 8.0000 mg | ORAL_TABLET | Freq: Once | ORAL | Status: DC

## 2019-09-06 NOTE — Telephone Encounter (Signed)
The patient reports he had his first [place] injection for [place] -19 today 09/06/2019.

## 2019-09-07 ENCOUNTER — Inpatient Hospital Stay: Payer: Medicare Other

## 2019-09-07 VITALS — BP 124/61 | HR 76 | Temp 97.3°F | Resp 16

## 2019-09-07 DIAGNOSIS — Z5111 Encounter for antineoplastic chemotherapy: Secondary | ICD-10-CM | POA: Diagnosis not present

## 2019-09-07 DIAGNOSIS — D469 Myelodysplastic syndrome, unspecified: Secondary | ICD-10-CM

## 2019-09-07 MED ORDER — ONDANSETRON HCL 4 MG PO TABS
8.0000 mg | ORAL_TABLET | Freq: Once | ORAL | Status: AC
  Administered 2019-09-07: 8 mg via ORAL

## 2019-09-07 MED ORDER — AZACITIDINE CHEMO SQ INJECTION
75.0000 mg/m2 | Freq: Once | INTRAMUSCULAR | Status: AC
  Administered 2019-09-07: 155 mg via SUBCUTANEOUS
  Filled 2019-09-07: qty 6.2

## 2019-09-07 NOTE — Patient Instructions (Signed)
Azacitidine suspension for injection (subcutaneous use) What is this medicine? AZACITIDINE (ay za SITE i deen) is a chemotherapy drug. This medicine reduces the growth of cancer cells and can suppress the immune system. It is used for treating myelodysplastic syndrome or some types of leukemia. This medicine may be used for other purposes; ask your health care provider or pharmacist if you have questions. COMMON BRAND NAME(S): Vidaza What should I tell my health care provider before I take this medicine? They need to know if you have any of these conditions:  kidney disease  liver disease  liver tumors  an unusual or allergic reaction to azacitidine, mannitol, other medicines, foods, dyes, or preservatives  pregnant or trying to get pregnant  breast-feeding How should I use this medicine? This medicine is for injection under the skin. It is administered in a hospital or clinic by a specially trained health care professional. Talk to your pediatrician regarding the use of this medicine in children. While this drug may be prescribed for selected conditions, precautions do apply. Overdosage: If you think you have taken too much of this medicine contact a poison control center or emergency room at once. NOTE: This medicine is only for you. Do not share this medicine with others. What if I miss a dose? It is important not to miss your dose. Call your doctor or health care professional if you are unable to keep an appointment. What may interact with this medicine? Interactions have not been studied. Give your health care provider a list of all the medicines, herbs, non-prescription drugs, or dietary supplements you use. Also tell them if you smoke, drink alcohol, or use illegal drugs. Some items may interact with your medicine. This list may not describe all possible interactions. Give your health care provider a list of all the medicines, herbs, non-prescription drugs, or dietary supplements  you use. Also tell them if you smoke, drink alcohol, or use illegal drugs. Some items may interact with your medicine. What should I watch for while using this medicine? Visit your doctor for checks on your progress. This drug may make you feel generally unwell. This is not uncommon, as chemotherapy can affect healthy cells as well as cancer cells. Report any side effects. Continue your course of treatment even though you feel ill unless your doctor tells you to stop. In some cases, you may be given additional medicines to help with side effects. Follow all directions for their use. Call your doctor or health care professional for advice if you get a fever, chills or sore throat, or other symptoms of a cold or flu. Do not treat yourself. This drug decreases your body's ability to fight infections. Try to avoid being around people who are sick. This medicine may increase your risk to bruise or bleed. Call your doctor or health care professional if you notice any unusual bleeding. You may need blood work done while you are taking this medicine. Do not become pregnant while taking this medicine and for 6 months after the last dose. Women should inform their doctor if they wish to become pregnant or think they might be pregnant. Men should not father a child while taking this medicine and for 3 months after the last dose. There is a potential for serious side effects to an unborn child. Talk to your health care professional or pharmacist for more information. Do not breast-feed an infant while taking this medicine and for 1 week after the last dose. This medicine may interfere with   the ability to have a child. Talk with your doctor or health care professional if you are concerned about your fertility. What side effects may I notice from receiving this medicine? Side effects that you should report to your doctor or health care professional as soon as possible:  allergic reactions like skin rash, itching or  hives, swelling of the face, lips, or tongue  low blood counts - this medicine may decrease the number of white blood cells, red blood cells and platelets. You may be at increased risk for infections and bleeding.  signs of infection - fever or chills, cough, sore throat, pain passing urine  signs of decreased platelets or bleeding - bruising, pinpoint red spots on the skin, black, tarry stools, blood in the urine  signs of decreased red blood cells - unusually weak or tired, fainting spells, lightheadedness  signs and symptoms of kidney injury like trouble passing urine or change in the amount of urine  signs and symptoms of liver injury like dark yellow or brown urine; general ill feeling or flu-like symptoms; light-colored stools; loss of appetite; nausea; right upper belly pain; unusually weak or tired; yellowing of the eyes or skin Side effects that usually do not require medical attention (report to your doctor or health care professional if they continue or are bothersome):  constipation  diarrhea  nausea, vomiting  pain or redness at the injection site  unusually weak or tired This list may not describe all possible side effects. Call your doctor for medical advice about side effects. You may report side effects to FDA at 1-800-FDA-1088. Where should I keep my medicine? This drug is given in a hospital or clinic and will not be stored at home. NOTE: This sheet is a summary. It may not cover all possible information. If you have questions about this medicine, talk to your doctor, pharmacist, or health care provider.  2020 Elsevier/Gold Standard (2016-09-15 14:37:51)  

## 2019-09-08 ENCOUNTER — Other Ambulatory Visit: Payer: Self-pay

## 2019-09-08 ENCOUNTER — Inpatient Hospital Stay: Payer: Medicare Other

## 2019-09-08 VITALS — BP 125/64 | HR 83 | Temp 98.0°F | Resp 16

## 2019-09-08 DIAGNOSIS — Z5111 Encounter for antineoplastic chemotherapy: Secondary | ICD-10-CM | POA: Diagnosis not present

## 2019-09-08 DIAGNOSIS — D469 Myelodysplastic syndrome, unspecified: Secondary | ICD-10-CM

## 2019-09-08 MED ORDER — AZACITIDINE CHEMO SQ INJECTION
75.0000 mg/m2 | Freq: Once | INTRAMUSCULAR | Status: AC
  Administered 2019-09-08: 155 mg via SUBCUTANEOUS
  Filled 2019-09-08: qty 6.2

## 2019-09-08 MED ORDER — ONDANSETRON HCL 4 MG PO TABS: 8.0000 mg | ORAL_TABLET | Freq: Once | ORAL | Status: DC

## 2019-09-08 NOTE — Patient Instructions (Signed)
Azacitidine suspension for injection (subcutaneous use) What is this medicine? AZACITIDINE (ay za SITE i deen) is a chemotherapy drug. This medicine reduces the growth of cancer cells and can suppress the immune system. It is used for treating myelodysplastic syndrome or some types of leukemia. This medicine may be used for other purposes; ask your health care provider or pharmacist if you have questions. COMMON BRAND NAME(S): Vidaza What should I tell my health care provider before I take this medicine? They need to know if you have any of these conditions:  kidney disease  liver disease  liver tumors  an unusual or allergic reaction to azacitidine, mannitol, other medicines, foods, dyes, or preservatives  pregnant or trying to get pregnant  breast-feeding How should I use this medicine? This medicine is for injection under the skin. It is administered in a hospital or clinic by a specially trained health care professional. Talk to your pediatrician regarding the use of this medicine in children. While this drug may be prescribed for selected conditions, precautions do apply. Overdosage: If you think you have taken too much of this medicine contact a poison control center or emergency room at once. NOTE: This medicine is only for you. Do not share this medicine with others. What if I miss a dose? It is important not to miss your dose. Call your doctor or health care professional if you are unable to keep an appointment. What may interact with this medicine? Interactions have not been studied. Give your health care provider a list of all the medicines, herbs, non-prescription drugs, or dietary supplements you use. Also tell them if you smoke, drink alcohol, or use illegal drugs. Some items may interact with your medicine. This list may not describe all possible interactions. Give your health care provider a list of all the medicines, herbs, non-prescription drugs, or dietary supplements  you use. Also tell them if you smoke, drink alcohol, or use illegal drugs. Some items may interact with your medicine. What should I watch for while using this medicine? Visit your doctor for checks on your progress. This drug may make you feel generally unwell. This is not uncommon, as chemotherapy can affect healthy cells as well as cancer cells. Report any side effects. Continue your course of treatment even though you feel ill unless your doctor tells you to stop. In some cases, you may be given additional medicines to help with side effects. Follow all directions for their use. Call your doctor or health care professional for advice if you get a fever, chills or sore throat, or other symptoms of a cold or flu. Do not treat yourself. This drug decreases your body's ability to fight infections. Try to avoid being around people who are sick. This medicine may increase your risk to bruise or bleed. Call your doctor or health care professional if you notice any unusual bleeding. You may need blood work done while you are taking this medicine. Do not become pregnant while taking this medicine and for 6 months after the last dose. Women should inform their doctor if they wish to become pregnant or think they might be pregnant. Men should not father a child while taking this medicine and for 3 months after the last dose. There is a potential for serious side effects to an unborn child. Talk to your health care professional or pharmacist for more information. Do not breast-feed an infant while taking this medicine and for 1 week after the last dose. This medicine may interfere with   the ability to have a child. Talk with your doctor or health care professional if you are concerned about your fertility. What side effects may I notice from receiving this medicine? Side effects that you should report to your doctor or health care professional as soon as possible:  allergic reactions like skin rash, itching or  hives, swelling of the face, lips, or tongue  low blood counts - this medicine may decrease the number of white blood cells, red blood cells and platelets. You may be at increased risk for infections and bleeding.  signs of infection - fever or chills, cough, sore throat, pain passing urine  signs of decreased platelets or bleeding - bruising, pinpoint red spots on the skin, black, tarry stools, blood in the urine  signs of decreased red blood cells - unusually weak or tired, fainting spells, lightheadedness  signs and symptoms of kidney injury like trouble passing urine or change in the amount of urine  signs and symptoms of liver injury like dark yellow or brown urine; general ill feeling or flu-like symptoms; light-colored stools; loss of appetite; nausea; right upper belly pain; unusually weak or tired; yellowing of the eyes or skin Side effects that usually do not require medical attention (report to your doctor or health care professional if they continue or are bothersome):  constipation  diarrhea  nausea, vomiting  pain or redness at the injection site  unusually weak or tired This list may not describe all possible side effects. Call your doctor for medical advice about side effects. You may report side effects to FDA at 1-800-FDA-1088. Where should I keep my medicine? This drug is given in a hospital or clinic and will not be stored at home. NOTE: This sheet is a summary. It may not cover all possible information. If you have questions about this medicine, talk to your doctor, pharmacist, or health care provider.  2020 Elsevier/Gold Standard (2016-09-15 14:37:51)  

## 2019-09-11 ENCOUNTER — Other Ambulatory Visit: Payer: Self-pay

## 2019-09-11 ENCOUNTER — Inpatient Hospital Stay: Payer: Medicare Other

## 2019-09-11 DIAGNOSIS — D469 Myelodysplastic syndrome, unspecified: Secondary | ICD-10-CM

## 2019-09-11 DIAGNOSIS — Z5111 Encounter for antineoplastic chemotherapy: Secondary | ICD-10-CM | POA: Diagnosis not present

## 2019-09-11 LAB — CBC WITH DIFFERENTIAL/PLATELET
Abs Immature Granulocytes: 0.04 10*3/uL (ref 0.00–0.07)
Basophils Absolute: 0 10*3/uL (ref 0.0–0.1)
Basophils Relative: 0 %
Eosinophils Absolute: 0 10*3/uL (ref 0.0–0.5)
Eosinophils Relative: 0 %
HCT: 24.9 % — ABNORMAL LOW (ref 39.0–52.0)
Hemoglobin: 7.5 g/dL — ABNORMAL LOW (ref 13.0–17.0)
Immature Granulocytes: 1 %
Lymphocytes Relative: 38 %
Lymphs Abs: 1.2 10*3/uL (ref 0.7–4.0)
MCH: 26.9 pg (ref 26.0–34.0)
MCHC: 30.1 g/dL (ref 30.0–36.0)
MCV: 89.2 fL (ref 80.0–100.0)
Monocytes Absolute: 0.3 10*3/uL (ref 0.1–1.0)
Monocytes Relative: 8 %
Neutro Abs: 1.7 10*3/uL (ref 1.7–7.7)
Neutrophils Relative %: 53 %
Platelets: 161 10*3/uL (ref 150–400)
RBC: 2.79 MIL/uL — ABNORMAL LOW (ref 4.22–5.81)
RDW: 16.2 % — ABNORMAL HIGH (ref 11.5–15.5)
WBC: 3.2 10*3/uL — ABNORMAL LOW (ref 4.0–10.5)
nRBC: 0 % (ref 0.0–0.2)

## 2019-09-11 LAB — SAMPLE TO BLOOD BANK

## 2019-09-12 ENCOUNTER — Ambulatory Visit: Payer: TRICARE For Life (TFL)

## 2019-09-15 ENCOUNTER — Other Ambulatory Visit: Payer: Self-pay

## 2019-09-18 ENCOUNTER — Inpatient Hospital Stay: Payer: Medicare Other

## 2019-09-18 ENCOUNTER — Other Ambulatory Visit: Payer: Self-pay | Admitting: Hematology and Oncology

## 2019-09-18 ENCOUNTER — Other Ambulatory Visit: Payer: Self-pay

## 2019-09-18 VITALS — BP 135/59 | HR 85 | Temp 96.9°F | Resp 16

## 2019-09-18 DIAGNOSIS — Z5111 Encounter for antineoplastic chemotherapy: Secondary | ICD-10-CM | POA: Diagnosis not present

## 2019-09-18 DIAGNOSIS — D630 Anemia in neoplastic disease: Secondary | ICD-10-CM

## 2019-09-18 DIAGNOSIS — D469 Myelodysplastic syndrome, unspecified: Secondary | ICD-10-CM

## 2019-09-18 LAB — CBC WITH DIFFERENTIAL/PLATELET
Abs Immature Granulocytes: 0.08 10*3/uL — ABNORMAL HIGH (ref 0.00–0.07)
Basophils Absolute: 0 10*3/uL (ref 0.0–0.1)
Basophils Relative: 0 %
Eosinophils Absolute: 0 10*3/uL (ref 0.0–0.5)
Eosinophils Relative: 0 %
HCT: 23.7 % — ABNORMAL LOW (ref 39.0–52.0)
Hemoglobin: 7.2 g/dL — ABNORMAL LOW (ref 13.0–17.0)
Immature Granulocytes: 3 %
Lymphocytes Relative: 48 %
Lymphs Abs: 1.5 10*3/uL (ref 0.7–4.0)
MCH: 27.2 pg (ref 26.0–34.0)
MCHC: 30.4 g/dL (ref 30.0–36.0)
MCV: 89.4 fL (ref 80.0–100.0)
Monocytes Absolute: 0.3 10*3/uL (ref 0.1–1.0)
Monocytes Relative: 9 %
Neutro Abs: 1.3 10*3/uL — ABNORMAL LOW (ref 1.7–7.7)
Neutrophils Relative %: 40 %
Platelets: 168 10*3/uL (ref 150–400)
RBC: 2.65 MIL/uL — ABNORMAL LOW (ref 4.22–5.81)
RDW: 17.1 % — ABNORMAL HIGH (ref 11.5–15.5)
WBC: 3.2 10*3/uL — ABNORMAL LOW (ref 4.0–10.5)
nRBC: 0.6 % — ABNORMAL HIGH (ref 0.0–0.2)

## 2019-09-18 LAB — SAMPLE TO BLOOD BANK

## 2019-09-18 LAB — PREPARE RBC (CROSSMATCH)

## 2019-09-18 MED ORDER — DARBEPOETIN ALFA 300 MCG/0.6ML IJ SOSY
300.0000 ug | PREFILLED_SYRINGE | Freq: Once | INTRAMUSCULAR | Status: AC
  Administered 2019-09-18: 300 ug via SUBCUTANEOUS

## 2019-09-18 NOTE — Patient Instructions (Signed)

## 2019-09-18 NOTE — Progress Notes (Signed)
Hemoglobin 7.2.  Patient to return tomorrow for a unit of PRBC at 1pm.

## 2019-09-19 ENCOUNTER — Inpatient Hospital Stay: Payer: Medicare Other

## 2019-09-19 DIAGNOSIS — D630 Anemia in neoplastic disease: Secondary | ICD-10-CM

## 2019-09-19 DIAGNOSIS — Z5111 Encounter for antineoplastic chemotherapy: Secondary | ICD-10-CM | POA: Diagnosis not present

## 2019-09-19 MED ORDER — DIPHENHYDRAMINE HCL 25 MG PO CAPS: 25.0000 mg | ORAL_CAPSULE | Freq: Once | ORAL | Status: DC

## 2019-09-19 MED ORDER — SODIUM CHLORIDE 0.9% IV SOLUTION
250.0000 mL | Freq: Once | INTRAVENOUS | Status: AC
  Administered 2019-09-19: 250 mL via INTRAVENOUS
  Filled 2019-09-19: qty 250

## 2019-09-19 MED ORDER — ACETAMINOPHEN 325 MG PO TABS: 650.0000 mg | ORAL_TABLET | Freq: Once | ORAL | Status: DC

## 2019-09-19 NOTE — Patient Instructions (Signed)

## 2019-09-20 LAB — BPAM RBC
Blood Product Expiration Date: 202102152359
ISSUE DATE / TIME: 202101191339
Unit Type and Rh: 7300

## 2019-09-20 LAB — TYPE AND SCREEN
ABO/RH(D): B POS
Antibody Screen: NEGATIVE
Unit division: 0

## 2019-10-02 ENCOUNTER — Inpatient Hospital Stay: Payer: Medicare Other | Attending: Hematology and Oncology

## 2019-10-02 ENCOUNTER — Other Ambulatory Visit: Payer: Self-pay

## 2019-10-02 ENCOUNTER — Inpatient Hospital Stay: Payer: Medicare Other

## 2019-10-02 VITALS — BP 140/64 | HR 72 | Temp 97.1°F | Resp 16

## 2019-10-02 DIAGNOSIS — D469 Myelodysplastic syndrome, unspecified: Secondary | ICD-10-CM

## 2019-10-02 DIAGNOSIS — Z5111 Encounter for antineoplastic chemotherapy: Secondary | ICD-10-CM | POA: Insufficient documentation

## 2019-10-02 DIAGNOSIS — D4621 Refractory anemia with excess of blasts 1: Secondary | ICD-10-CM | POA: Diagnosis present

## 2019-10-02 LAB — CBC WITH DIFFERENTIAL/PLATELET
Abs Immature Granulocytes: 0.03 10*3/uL (ref 0.00–0.07)
Basophils Absolute: 0 10*3/uL (ref 0.0–0.1)
Basophils Relative: 0 %
Eosinophils Absolute: 0 10*3/uL (ref 0.0–0.5)
Eosinophils Relative: 0 %
HCT: 27.9 % — ABNORMAL LOW (ref 39.0–52.0)
Hemoglobin: 8.4 g/dL — ABNORMAL LOW (ref 13.0–17.0)
Immature Granulocytes: 1 %
Lymphocytes Relative: 43 %
Lymphs Abs: 1.5 10*3/uL (ref 0.7–4.0)
MCH: 27.6 pg (ref 26.0–34.0)
MCHC: 30.1 g/dL (ref 30.0–36.0)
MCV: 91.8 fL (ref 80.0–100.0)
Monocytes Absolute: 0.2 10*3/uL (ref 0.1–1.0)
Monocytes Relative: 7 %
Neutro Abs: 1.7 10*3/uL (ref 1.7–7.7)
Neutrophils Relative %: 49 %
Platelets: 217 10*3/uL (ref 150–400)
RBC: 3.04 MIL/uL — ABNORMAL LOW (ref 4.22–5.81)
RDW: 17.2 % — ABNORMAL HIGH (ref 11.5–15.5)
WBC: 3.4 10*3/uL — ABNORMAL LOW (ref 4.0–10.5)
nRBC: 0 % (ref 0.0–0.2)

## 2019-10-02 LAB — SAMPLE TO BLOOD BANK

## 2019-10-02 MED ORDER — DARBEPOETIN ALFA 300 MCG/0.6ML IJ SOSY
300.0000 ug | PREFILLED_SYRINGE | Freq: Once | INTRAMUSCULAR | Status: AC
  Administered 2019-10-02: 300 ug via SUBCUTANEOUS

## 2019-10-02 NOTE — Patient Instructions (Signed)
Darbepoetin Alfa injection What is this medicine? DARBEPOETIN ALFA (dar be POE e tin AL fa) helps your body make more red blood cells. It is used to treat anemia caused by chronic kidney failure and chemotherapy. This medicine may be used for other purposes; ask your health care provider or pharmacist if you have questions. COMMON BRAND NAME(S): Aranesp What should I tell my health care provider before I take this medicine? They need to know if you have any of these conditions:  blood clotting disorders or history of blood clots  cancer patient not on chemotherapy  cystic fibrosis  heart disease, such as angina, heart failure, or a history of a heart attack  hemoglobin level of 12 g/dL or greater  high blood pressure  low levels of folate, iron, or vitamin B12  seizures  an unusual or allergic reaction to darbepoetin, erythropoietin, albumin, hamster proteins, latex, other medicines, foods, dyes, or preservatives  pregnant or trying to get pregnant  breast-feeding How should I use this medicine? This medicine is for injection into a vein or under the skin. It is usually given by a health care professional in a hospital or clinic setting. If you get this medicine at home, you will be taught how to prepare and give this medicine. Use exactly as directed. Take your medicine at regular intervals. Do not take your medicine more often than directed. It is important that you put your used needles and syringes in a special sharps container. Do not put them in a trash can. If you do not have a sharps container, call your pharmacist or healthcare provider to get one. A special MedGuide will be given to you by the pharmacist with each prescription and refill. Be sure to read this information carefully each time. Talk to your pediatrician regarding the use of this medicine in children. While this medicine may be used in children as young as 1 month of age for selected conditions, precautions do  apply. Overdosage: If you think you have taken too much of this medicine contact a poison control center or emergency room at once. NOTE: This medicine is only for you. Do not share this medicine with others. What if I miss a dose? If you miss a dose, take it as soon as you can. If it is almost time for your next dose, take only that dose. Do not take double or extra doses. What may interact with this medicine? Do not take this medicine with any of the following medications:  epoetin alfa This list may not describe all possible interactions. Give your health care provider a list of all the medicines, herbs, non-prescription drugs, or dietary supplements you use. Also tell them if you smoke, drink alcohol, or use illegal drugs. Some items may interact with your medicine. What should I watch for while using this medicine? Your condition will be monitored carefully while you are receiving this medicine. You may need blood work done while you are taking this medicine. This medicine may cause a decrease in vitamin B6. You should make sure that you get enough vitamin B6 while you are taking this medicine. Discuss the foods you eat and the vitamins you take with your health care professional. What side effects may I notice from receiving this medicine? Side effects that you should report to your doctor or health care professional as soon as possible:  allergic reactions like skin rash, itching or hives, swelling of the face, lips, or tongue  breathing problems  changes in   vision  chest pain  confusion, trouble speaking or understanding  feeling faint or lightheaded, falls  high blood pressure  muscle aches or pains  pain, swelling, warmth in the leg  rapid weight gain  severe headaches  sudden numbness or weakness of the face, arm or leg  trouble walking, dizziness, loss of balance or coordination  seizures (convulsions)  swelling of the ankles, feet, hands  unusually weak or  tired Side effects that usually do not require medical attention (report to your doctor or health care professional if they continue or are bothersome):  diarrhea  fever, chills (flu-like symptoms)  headaches  nausea, vomiting  redness, stinging, or swelling at site where injected This list may not describe all possible side effects. Call your doctor for medical advice about side effects. You may report side effects to FDA at 1-800-FDA-1088. Where should I keep my medicine? Keep out of the reach of children. Store in a refrigerator between 2 and 8 degrees C (36 and 46 degrees F). Do not freeze. Do not shake. Throw away any unused portion if using a single-dose vial. Throw away any unused medicine after the expiration date. NOTE: This sheet is a summary. It may not cover all possible information. If you have questions about this medicine, talk to your doctor, pharmacist, or health care provider.  2020 [employer] Standard (2017-09-01 16:44:20)  

## 2019-10-12 NOTE — Progress Notes (Signed)
[facility]  [address] [place], [place] [address] Phone: [phone]  Fax: [phone]   Clinic Day:  10/16/2019  Referring physician: Tracie Harrier, MD  Chief Complaint: Paul Pham. is a 86 y.o. 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   HPI: The patient was last seen in the hematology clinic on 09/04/2019. At that time, he felt "ok".  Energy level was good.  He denied any excess bruising or bleeding. He had no interval infections.  He continued Aranesp every 2 weeks to decrease transfusion dependence. We discussed initiation of an oral chelating agent, Exjade or Jadenu.  He began cycle #19 Vidaza day 1-5 day.   He received Aranesp on 09/18/2019 and 10/02/2019.  He received a PRBC transfusion on 09/19/2019.  He received his first [place] vaccine for COVID-19 on 09/06/2019.   Labs followed: 09/11/2019: Hematocrit 24.9, hemoglobin 7.5, MCV 89.2, platelets 161,000, WBC 3,200 (ANC 1700). 09/18/2019: Hematocrit 23.7, hemoglobin 7.2, MCV 89.4, platelets 168,000, WBC 3,200 (ANC 1300).  10/02/2019: Hematocrit 27.9, hemoglobin 8.4, MCV 91.8, platelets 217,000, WBC 3,400 (ANC 1700).   During the interim, he has felt "great".  He has good energy.  He continues to stay active. He walked 2 mile this mornig. His blood sugar has been running high. He is taking Miralax for consitpation.   Patient reports getting both COVID-19 injections.   Patient decided to have a transfusion later this week.  I encouraged the patient and his family that if he feels sick or has a fever to contact the clinic. Patient and family understood.    Past Medical History:  Diagnosis Date  . Anemia   . Arthritis   . BPH (benign prostatic hypertrophy)   . Complication of anesthesia    nausea  . Diabetes type 2, controlled ([place])   . HOH (hard of hearing)    r and L ears-70% loss per pt  . Hyperlipidemia   . Hypertension     Past Surgical  History:  Procedure Laterality Date  . APPENDECTOMY    . COLONOSCOPY  09/21/08   1 polyp found, tubular adenoma  . HAND SURGERY    . KNEE SURGERY Left     Family History  Problem Relation Age of Onset  . Aneurysm Mother   . Heart disease Father   . Cancer Brother        skin  . Heart disease Brother   . Heart disease Brother   . Cancer Sister        skin  . Aneurysm Brother   . Heart disease Brother   . Heart disease Sister   . Heart disease Sister   . COPD Sister   . Diabetes Sister     Social History:  reports that he has never smoked. He has never used smokeless tobacco. He reports that he does not drink alcohol or use drugs. 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 Patient denies known exposures to radiation on toxins. 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 He had his 67-year marriage anniversary on 08/23/2018.He is walking 1-1miles per day.He lives in [place]. His great grandchild was recently born. The patient is accompanied by his wife and neice via Ipad today.  Allergies: No Known Allergies  Current Medications: Current Outpatient Medications  Medication Sig Dispense Refill  . aspirin 81 MG chewable tablet Chew 81 mg by mouth daily.    . Cranberry (THERACRAN PO)  Take 1 tablet by mouth daily.     . cyanocobalamin 1000 MCG tablet Take 1,000 mcg by mouth daily.    . finasteride (PROSCAR) 5 MG tablet Take 5 mg by mouth daily.    Marland Kitchen glimepiride (AMARYL) 1 MG tablet Take 1 tablet by mouth daily after breakfast.    . glucose blood test strip FreeStyle Lite Strips    . LANCETS ULTRA FINE MISC Lancets,Ultra Thin 26 gauge    . lisinopril (PRINIVIL,ZESTRIL) 10 MG tablet Take 10 mg by mouth daily.    . meclizine (ANTIVERT) 25 MG tablet Take 25 mg by mouth 2 (two) times daily as needed.     . metformin (FORTAMET) 1000 MG (OSM) 24 hr tablet Take 1,000 mg by mouth 2 (two) times daily with a meal.    . Multiple Vitamins-Minerals (CENTRUM SILVER PO) Take by  mouth.    . Omega-3 Fatty Acids (FISH OIL PO) Take 1 tablet by mouth daily.     . Polyethylene Glycol 3350 (MIRALAX PO) Take by mouth as needed.     [name] (PROSTATE SR PO) Take 1 tablet by mouth daily.     . simvastatin (ZOCOR) 40 MG tablet Take 40 mg by mouth daily.    Marland Kitchen loperamide (IMODIUM) 2 MG capsule Take 1 capsule (2 mg total) by mouth See admin instructions. With onset of loose stool, take 4mg  followed by 2mg  every 2 hours until loose bowel movement stopped. Maximum: 16 mg/day (Patient not taking: Reported on 06/05/2019) 30 capsule 0  . ondansetron (ZOFRAN) 4 MG tablet Take 1 tablet (4 mg total) by mouth every 6 (six) hours as needed for nausea or vomiting. (Patient not taking: Reported on 08/28/2019) 30 tablet 2   No current facility-administered medications for this visit.    Review of Systems  Constitutional: Negative for chills, diaphoresis, fever, malaise/fatigue and weight loss (stable).       Feels "great".  Good energy.  Active- walks daily.  HENT: Positive for hearing loss. Negative for congestion, ear pain, nosebleeds, sinus pain and sore throat.   Eyes: Negative.  Negative for blurred vision, double vision, photophobia and pain.  Respiratory: Negative.  Negative for cough, sputum production, shortness of breath and wheezing.   Cardiovascular: Negative.  Negative for chest pain, palpitations, orthopnea, leg swelling and PND.  Gastrointestinal: Positive for constipation (well managed with Miralax). Negative for abdominal pain, blood in stool, diarrhea, melena, nausea and vomiting.  Genitourinary: Negative.  Negative for dysuria, frequency, hematuria and urgency.  Musculoskeletal: Negative.  Negative for back pain, falls, joint pain, myalgias and neck pain.  Skin: Negative.  Negative for itching and rash.  Neurological: Negative.  Negative for dizziness, tingling, sensory change, speech change, focal weakness, weakness and headaches.  Endo/Heme/Allergies:  Negative.  Does not bruise/bleed easily.       Diabetes.  Psychiatric/Behavioral: Negative.  Negative for depression and memory loss. The patient is not nervous/anxious and does not have insomnia.   All other systems reviewed and are negative.  Performance status (ECOG): 0  Vitals Blood pressure (!) 150/65, pulse 67, temperature (!) 96.5 F (35.8 C), temperature source Tympanic, resp. rate 18, height 6\' 1"  (1.854 m), weight 181 lb 3.5 oz (82.2 kg), SpO2 100 %.   Physical Exam  Constitutional: He is oriented to person, place, and time. He appears well-developed and well-nourished. No distress.  HENT:  Head: Normocephalic and atraumatic.  Mouth/Throat: Oropharynx is clear and moist. No oropharyngeal exudate.  Sparse short gray hair.  Near alopecia/male pattern baldness.  Mask.  Hearing aid.    Eyes: Pupils are equal, round, and reactive to light. Conjunctivae and EOM are normal. No scleral icterus.  Gold rimmed glasses.  Blue eyes.  Neck: No JVD present.  Cardiovascular: Normal rate, regular rhythm and normal heart sounds. Exam reveals no gallop.  No murmur heard. Pulmonary/Chest: Effort normal and breath sounds normal. No respiratory distress. He has no wheezes. He has no rales.  Abdominal: Soft. Bowel sounds are normal. He exhibits no distension and no mass. There is no abdominal tenderness. There is no rebound and no guarding.  Umbilical hernia.  Musculoskeletal:        General: No tenderness or edema. Normal range of motion.     Cervical back: Normal range of motion.  Lymphadenopathy:       Head (right side): No preauricular, no posterior auricular and no occipital adenopathy present.       Head (left side): No preauricular, no posterior auricular and no occipital adenopathy present.    He has no cervical adenopathy.    He has no axillary adenopathy.       Right: No inguinal and no supraclavicular adenopathy present.       Left: No inguinal and no supraclavicular adenopathy  present.  Neurological: He is alert and oriented to person, place, and time. He has normal reflexes.  Skin: Skin is warm and dry. No rash noted. He is not diaphoretic. No erythema. No pallor.  Psychiatric: He has a normal mood and affect. His behavior is normal. Judgment and thought content normal.  Nursing note and vitals reviewed.   Appointment on 10/16/2019  Component Date Value Ref Range Status  . Sodium 10/16/2019 134* 135 - 145 mmol/L Final  . Potassium 10/16/2019 4.6  3.5 - 5.1 mmol/L Final  . Chloride 10/16/2019 102  98 - 111 mmol/L Final  . CO2 10/16/2019 26  22 - 32 mmol/L Final  . Glucose, Bld 10/16/2019 154* 70 - 99 mg/dL Final  . BUN 10/16/2019 24* 8 - 23 mg/dL Final  . Creatinine, Ser 10/16/2019 0.93  0.61 - 1.24 mg/dL Final  . Calcium 10/16/2019 9.0  8.9 - 10.3 mg/dL Final  . Total Protein 10/16/2019 7.1  6.5 - 8.1 g/dL Final  . Albumin 10/16/2019 4.2  3.5 - 5.0 g/dL Final  . AST 10/16/2019 19  15 - 41 U/L Final  . ALT 10/16/2019 17  0 - 44 U/L Final  . Alkaline Phosphatase 10/16/2019 28* 38 - 126 U/L Final  . Total Bilirubin 10/16/2019 0.7  0.3 - 1.2 mg/dL Final  . GFR calc non Af Amer 10/16/2019 >60  >60 mL/min Final  . GFR calc Af Amer 10/16/2019 >60  >60 mL/min Final  . Anion gap 10/16/2019 6  5 - 15 Final   Performed at [facility], [address] [place], [place] [address]  . WBC 10/16/2019 3.1* 4.0 - 10.5 K/uL Final  . RBC 10/16/2019 2.76* 4.22 - 5.81 MIL/uL Final  . Hemoglobin 10/16/2019 7.4* 13.0 - 17.0 g/dL Final  . HCT 10/16/2019 25.2* 39.0 - 52.0 % Final  . MCV 10/16/2019 91.3  80.0 - 100.0 fL Final  . MCH 10/16/2019 26.8  26.0 - 34.0 pg Final  . MCHC 10/16/2019 29.4* 30.0 - 36.0 g/dL Final  . RDW 10/16/2019 17.0* 11.5 - 15.5 % Final  . Platelets 10/16/2019 253  150 - 400 K/uL Final  . nRBC 10/16/2019 0.0  0.0 - 0.2 % Final  .  Neutrophils Relative % 10/16/2019 44  % Final  . Neutro Abs 10/16/2019 1.4* 1.7 - 7.7 K/uL Final  . Lymphocytes  Relative 10/16/2019 43  % Final  . Lymphs Abs 10/16/2019 1.4  0.7 - 4.0 K/uL Final  . Monocytes Relative 10/16/2019 11  % Final  . Monocytes Absolute 10/16/2019 0.3  0.1 - 1.0 K/uL Final  . Eosinophils Relative 10/16/2019 0  % Final  . Eosinophils Absolute 10/16/2019 0.0  0.0 - 0.5 K/uL Final  . Basophils Relative 10/16/2019 0  % Final  . Basophils Absolute 10/16/2019 0.0  0.0 - 0.1 K/uL Final  . Immature Granulocytes 10/16/2019 2  % Final  . Abs Immature Granulocytes 10/16/2019 0.05  0.00 - 0.07 K/uL Final   Performed at [facility], [address] [place],  [address]    Assessment:  Paul Pham. is a 86 y.o. 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 Bone marrowon 09/22/2017 revealed a hypercellular for age with dyspoietic changes variably involving myeloid cell lines, but with main involvement of the granulocytic/monocytic cell line. This was associated with bone marrow monocytosis and borderline number to slight increase in blastic cells. The overall changes favor a primary myeloid neoplasm, particularly a myelodysplastic syndrome especially refractory anemia with excess blasts (RAEB-1) or possibly refractory cytopenia with multilineage dysplasia. Consideration was also given to an evolving myelodysplastic/myeloproliferative neoplasm such as chronic myelomonocytic leukemia but the lack of absolute peripheral monocytosis precluded such a diagnosis at this time. Flow cytometrywas negative. Cytogeneticswere normal (46, XY). Foundation one was positive for ASXL1 [id number], EZH2 Splice site [id number], RUNX1 [id number]. IPSS-R4.5, intermediate group.  Anemia work-up on 08/17/2017: Vitamin B12 (658), folate (22.0), and TSH (2.688). Retic was 2.6%. Haptoglobin was 116 on 08/19/2017. Epo levelwas 104.8 on 10/05/2017.  Ferritin has been followed: 106 on 08/17/2017, 131 on 11/04/2017, 144 on 05/30/2018, 394 on 09/01/2018, 441 on 11/28/2018, 394 on 01/02/2019, 480 on 02/06/2019, 392 on  03/13/2019, 527 on 04/17/2019, 533 on 05/29/2019, 476 on 06/05/2019, 517 on 07/17/2019, 685 on 08/28/2019, and 724 on 10/16/2019. Iron saturation was 34% on 08/17/2017 and 89% on 09/01/2018.  Bone marrowon 09/22/2017 revealed a hypercellular for age with dyspoietic changes variably involving myeloid cell lines, but with main involvement of the granulocytic/monocytic cell line. This was associated with bone marrow monocytosis and borderline number to slight increase in blasts (5%). The overall changes favor a primary myeloid neoplasm, particularly a myelodysplastic syndrome especially refractory anemia with excess blasts (RAEB-1) or possibly refractory cytopenia with multilineage dysplasia. Consideration was also given to an evolving myelodysplastic/myeloproliferative neoplasm such as chronic myelomonocytic leukemia but the lack of absolute peripheral monocytosis precluded such a diagnosis at this time. Flow cytometrywas negative. Cytogeneticswere normal (46, XY). Foundation One was positive for ASXL1 [id number], EZH2 Splice site [id number], RUNX1 [id number].  He has received 19 cycles ofVidaza(11/04/2017 - 08/22/2018; 10/24/2018-09/04/2019). He has received Aranesp(initially 150 mcg every 2 weeks post chemotherapy then 300 mcg every 1-2 weeks after cycle #3; last 09/19/2018). He last received Aranesp on02/09/2019. He has received 1-2 units of PRBCswith each cycle (last01/19/2021).He has received23units of PRBCs since 08/17/2017.  Bone marrowon 08/12/2018 revealed a hypercellular marrow with dyspoietic changes. There was a borderline number of blasts (5%) seen by morphology. The findings were similar to previous biopsy although the cellularity is slightly higher in the current material.consistent with previously known myelodysplastic syndrome. Flow cytometry was negative. Cytogenetics were normal (46, XY).  He received both [employer] vaccines for COVID-19 (first on 09/06/2019).    Symptomatically, he is doing well.  He remains active.  Hemoglobin is 7.4.  WBC is 3100 (ANC 1400).  Plan: 1.   Labs today: CBC with diff, CMP, ferritin, hold tube. 2. Refractory anemia with excess blasts (RAEB-1) Clinically, he is doing well  He last received Vidaza 6 weeks ago (09/04/2019).   Prior cycle length was 7 weeks. Treatmentgoal means to decrease transfusion dependence and prevent acceleration of disease (blasts).   Review counts today.  WBC 3100 with an ANC of 1400. Postpone cycle #20 Vidaza 1 week.  He continues Aranesp every 2 weeks to decrease transfusion dependence (last 10/02/2019). Aranesp 300 mcg today.  3. Iron overload [phone] today. He has received24units of PRBCs to date (08/17/2017 -09/19/2019). Discuss consideration of initiation of Jadenu as ferritin approaches 1000.   Given potential side effects, plan initiation at 7 mg/kg/day.   Contact Nuala Alpha, outpatient pharmacist. 4.   Aranesp today. 5.   RTC on 10/18/2019 for 1 unit od PRBCs. 6.   RTC in 1 week for MD assessment, labs (CBC with diff, CMP), and cycle #20 Vidaza (day 1-5).  I discussed the assessment and treatment plan with the patient.  The patient was provided an opportunity to ask questions and all were answered.  The patient agreed with the plan and demonstrated an understanding of the instructions.  The patient was advised to call back if the symptoms worsen or if the condition fails to improve as anticipated.   Lequita Asal, MD, PhD    10/16/2019, 11:03 AM  I, Selena Batten, am acting as scribe for [employer]. Mike Gip, MD, PhD.  I, Bradyn Soward C. Mike Gip, MD, have reviewed the above documentation for accuracy and completeness, and I agree with the above.

## 2019-10-16 ENCOUNTER — Inpatient Hospital Stay: Payer: Medicare Other

## 2019-10-16 ENCOUNTER — Other Ambulatory Visit: Payer: Self-pay

## 2019-10-16 ENCOUNTER — Encounter: Payer: Self-pay | Admitting: Hematology and Oncology

## 2019-10-16 ENCOUNTER — Other Ambulatory Visit: Payer: Self-pay | Admitting: Hematology and Oncology

## 2019-10-16 ENCOUNTER — Inpatient Hospital Stay (HOSPITAL_BASED_OUTPATIENT_CLINIC_OR_DEPARTMENT_OTHER): Payer: Medicare Other | Admitting: Hematology and Oncology

## 2019-10-16 VITALS — BP 150/65 | HR 67 | Temp 96.5°F | Resp 18 | Ht 73.0 in | Wt 181.2 lb

## 2019-10-16 DIAGNOSIS — Z5111 Encounter for antineoplastic chemotherapy: Secondary | ICD-10-CM | POA: Diagnosis not present

## 2019-10-16 DIAGNOSIS — D469 Myelodysplastic syndrome, unspecified: Secondary | ICD-10-CM | POA: Diagnosis not present

## 2019-10-16 DIAGNOSIS — Z7189 Other specified counseling: Secondary | ICD-10-CM

## 2019-10-16 DIAGNOSIS — D630 Anemia in neoplastic disease: Secondary | ICD-10-CM | POA: Diagnosis not present

## 2019-10-16 LAB — CBC WITH DIFFERENTIAL/PLATELET
Abs Immature Granulocytes: 0.05 10*3/uL (ref 0.00–0.07)
Basophils Absolute: 0 10*3/uL (ref 0.0–0.1)
Basophils Relative: 0 %
Eosinophils Absolute: 0 10*3/uL (ref 0.0–0.5)
Eosinophils Relative: 0 %
HCT: 25.2 % — ABNORMAL LOW (ref 39.0–52.0)
Hemoglobin: 7.4 g/dL — ABNORMAL LOW (ref 13.0–17.0)
Immature Granulocytes: 2 %
Lymphocytes Relative: 43 %
Lymphs Abs: 1.4 10*3/uL (ref 0.7–4.0)
MCH: 26.8 pg (ref 26.0–34.0)
MCHC: 29.4 g/dL — ABNORMAL LOW (ref 30.0–36.0)
MCV: 91.3 fL (ref 80.0–100.0)
Monocytes Absolute: 0.3 10*3/uL (ref 0.1–1.0)
Monocytes Relative: 11 %
Neutro Abs: 1.4 10*3/uL — ABNORMAL LOW (ref 1.7–7.7)
Neutrophils Relative %: 44 %
Platelets: 253 10*3/uL (ref 150–400)
RBC: 2.76 MIL/uL — ABNORMAL LOW (ref 4.22–5.81)
RDW: 17 % — ABNORMAL HIGH (ref 11.5–15.5)
WBC: 3.1 10*3/uL — ABNORMAL LOW (ref 4.0–10.5)
nRBC: 0 % (ref 0.0–0.2)

## 2019-10-16 LAB — COMPREHENSIVE METABOLIC PANEL
ALT: 17 U/L (ref 0–44)
AST: 19 U/L (ref 15–41)
Albumin: 4.2 g/dL (ref 3.5–5.0)
Alkaline Phosphatase: 28 U/L — ABNORMAL LOW (ref 38–126)
Anion gap: 6 (ref 5–15)
BUN: 24 mg/dL — ABNORMAL HIGH (ref 8–23)
CO2: 26 mmol/L (ref 22–32)
Calcium: 9 mg/dL (ref 8.9–10.3)
Chloride: 102 mmol/L (ref 98–111)
Creatinine, Ser: 0.93 mg/dL (ref 0.61–1.24)
GFR calc Af Amer: >60 mL/min (ref >60)
GFR calc non Af Amer: >60 mL/min (ref >60)
Glucose, Bld: 154 mg/dL — ABNORMAL HIGH (ref 70–99)
Potassium: 4.6 mmol/L (ref 3.5–5.1)
Sodium: 134 mmol/L — ABNORMAL LOW (ref 135–145)
Total Bilirubin: 0.7 mg/dL (ref 0.3–1.2)
Total Protein: 7.1 g/dL (ref 6.5–8.1)

## 2019-10-16 LAB — SAMPLE TO BLOOD BANK

## 2019-10-16 LAB — FERRITIN: Ferritin: 724 ng/mL — ABNORMAL HIGH (ref 24–336)

## 2019-10-16 MED ORDER — DARBEPOETIN ALFA 300 MCG/0.6ML IJ SOSY
300.0000 ug | PREFILLED_SYRINGE | Freq: Once | INTRAMUSCULAR | Status: AC
  Administered 2019-10-16: 300 ug via SUBCUTANEOUS

## 2019-10-16 NOTE — Progress Notes (Signed)
No new changes noted today 

## 2019-10-17 ENCOUNTER — Other Ambulatory Visit: Payer: Self-pay | Admitting: *Deleted

## 2019-10-17 ENCOUNTER — Inpatient Hospital Stay: Payer: Medicare Other

## 2019-10-17 DIAGNOSIS — D469 Myelodysplastic syndrome, unspecified: Secondary | ICD-10-CM

## 2019-10-17 LAB — PREPARE RBC (CROSSMATCH)

## 2019-10-18 ENCOUNTER — Inpatient Hospital Stay: Payer: Medicare Other

## 2019-10-18 ENCOUNTER — Other Ambulatory Visit: Payer: Self-pay

## 2019-10-18 DIAGNOSIS — D469 Myelodysplastic syndrome, unspecified: Secondary | ICD-10-CM

## 2019-10-18 DIAGNOSIS — Z5111 Encounter for antineoplastic chemotherapy: Secondary | ICD-10-CM | POA: Diagnosis not present

## 2019-10-18 MED ORDER — SODIUM CHLORIDE 0.9% IV SOLUTION
250.0000 mL | Freq: Once | INTRAVENOUS | Status: AC
  Administered 2019-10-18: 250 mL via INTRAVENOUS
  Filled 2019-10-18: qty 250

## 2019-10-18 MED ORDER — ACETAMINOPHEN 325 MG PO TABS: 650.0000 mg | ORAL_TABLET | Freq: Once | ORAL | Status: AC

## 2019-10-18 MED ORDER — DIPHENHYDRAMINE HCL 25 MG PO CAPS: 25.0000 mg | ORAL_CAPSULE | Freq: Once | ORAL | Status: AC

## 2019-10-18 NOTE — Progress Notes (Signed)
[facility]  [address] [place], [place] [address] Phone: [phone]  Fax: [phone]   Clinic Day:  10/23/2019  Referring physician: Tracie Harrier, MD  Chief Complaint: Paul Pham. is a 86 y.o. male with RAEB-1 who is seen for 1 week assessment prior to cycle #20 Vidaza (day 1-5).    HPI: The patient was last seen in the hematology clinic on 10/16/2019. At that time, he was doing well. He remained active. Hematocrit was 25.5, hemoglobin 7.4, platelets 253,000, WBC 3,100 (ANC 1,400). Ferritin was 724. Sodium was 134.  He received Aranesp 300 mcg SQ.  Treatment was held x 1 week secondary to mild leukopenia.Marland Kitchen   He received 1 unit of PRBCs on 10/18/2019.  During the interim, he was feeling "slow" because it's Monday.  He stays active by walking.  He continues to use MiraLax for constipation. His reports his blood sugar is running high. He feels like exercise helps to keep his diabetes regulated.   He notes that he has not heard from [employer] from pharmacy yet.    Past Medical History:  Diagnosis Date  . Anemia   . Arthritis   . BPH (benign prostatic hypertrophy)   . Complication of anesthesia    nausea  . Diabetes type 2, controlled ([place])   . HOH (hard of hearing)    r and L ears-70% loss per pt  . Hyperlipidemia   . Hypertension     Past Surgical History:  Procedure Laterality Date  . APPENDECTOMY    . COLONOSCOPY  09/21/08   1 polyp found, tubular adenoma  . HAND SURGERY    . KNEE SURGERY Left     Family History  Problem Relation Age of Onset  . Aneurysm Mother   . Heart disease Father   . Cancer Brother        skin  . Heart disease Brother   . Heart disease Brother   . Cancer Sister        skin  . Aneurysm Brother   . Heart disease Brother   . Heart disease Sister   . Heart disease Sister   . COPD Sister   . Diabetes Sister     Social History:  reports that he has never smoked. He has never used  smokeless tobacco. He reports that he does not drink alcohol or use drugs. 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 Patient denies known exposures to radiation on toxins. 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 He had his 29-year marriage anniversary on 08/23/2018.He is walking 1-28miles per day.He lives in [place]. His great grandchild was recently born. The patient is accompanied by his oldest son today.  Allergies: No Known Allergies  Current Medications: Current Outpatient Medications  Medication Sig Dispense Refill  . aspirin 81 MG chewable tablet Chew 81 mg by mouth daily.    . Cranberry (THERACRAN PO) Take 1 tablet by mouth daily.     . cyanocobalamin 1000 MCG tablet Take 1,000 mcg by mouth daily.    . finasteride (PROSCAR) 5 MG tablet Take 5 mg by mouth daily.    Marland Kitchen glimepiride (AMARYL) 1 MG tablet Take 1 tablet by mouth daily after breakfast.    . glucose blood test strip FreeStyle Lite Strips    . LANCETS ULTRA FINE MISC Lancets,Ultra Thin 26 gauge    . lisinopril (PRINIVIL,ZESTRIL) 10 MG tablet Take 10 mg by mouth daily.    Marland Kitchen  meclizine (ANTIVERT) 25 MG tablet Take 25 mg by mouth 2 (two) times daily as needed.     . metformin (FORTAMET) 1000 MG (OSM) 24 hr tablet Take 1,000 mg by mouth 2 (two) times daily with a meal.    . Multiple Vitamins-Minerals (CENTRUM SILVER PO) Take by mouth.    . Omega-3 Fatty Acids (FISH OIL PO) Take 1 tablet by mouth daily.     . Polyethylene Glycol 3350 (MIRALAX PO) Take by mouth as needed.     [name] (PROSTATE SR PO) Take 1 tablet by mouth daily.     . simvastatin (ZOCOR) 40 MG tablet Take 40 mg by mouth daily.    Marland Kitchen loperamide (IMODIUM) 2 MG capsule Take 1 capsule (2 mg total) by mouth See admin instructions. With onset of loose stool, take 4mg  followed by 2mg  every 2 hours until loose bowel movement stopped. Maximum: 16 mg/day (Patient not taking: Reported on 06/05/2019) 30 capsule 0  . ondansetron (ZOFRAN) 4 MG  tablet Take 1 tablet (4 mg total) by mouth every 6 (six) hours as needed for nausea or vomiting. (Patient not taking: Reported on 08/28/2019) 30 tablet 2   No current facility-administered medications for this visit.   Facility-Administered Medications Ordered in Other Visits  Medication Dose Route Frequency Provider Last Rate Last Admin  . acetaminophen (TYLENOL) tablet 650 mg  650 mg Oral Once Nolon Stalls C, MD      . diphenhydrAMINE (BENADRYL) capsule 25 mg  25 mg Oral Once Lequita Asal, MD        Review of Systems  Constitutional: Positive for weight loss (2 lbs). Negative for chills, diaphoresis, fever and malaise/fatigue.       Feels "slow" because it's Monday.  Good energy.  Active- walks daily.  HENT: Positive for hearing loss. Negative for congestion, ear pain, nosebleeds, sinus pain and sore throat.   Eyes: Negative.  Negative for blurred vision, double vision, photophobia and pain.  Respiratory: Negative.  Negative for cough, sputum production, shortness of breath and wheezing.   Cardiovascular: Negative.  Negative for chest pain, palpitations, orthopnea, leg swelling and PND.  Gastrointestinal: Positive for constipation (well managed with Miralax). Negative for abdominal pain, blood in stool, diarrhea, melena, nausea and vomiting.  Genitourinary: Negative.  Negative for dysuria, frequency, hematuria and urgency.  Musculoskeletal: Negative.  Negative for back pain, falls, joint pain, myalgias and neck pain.  Skin: Negative.  Negative for itching and rash.  Neurological: Negative.  Negative for dizziness, tingling, sensory change, speech change, focal weakness, weakness and headaches.  Endo/Heme/Allergies: Does not bruise/bleed easily.       Diabetes.  Psychiatric/Behavioral: Negative.  Negative for depression and memory loss. The patient is not nervous/anxious and does not have insomnia.   All other systems reviewed and are negative.  Performance status (ECOG):  1  Vitals Blood pressure (!) 156/59, pulse 69, temperature (!) 97.1 F (36.2 C), temperature source Tympanic, resp. rate 18, weight 179 lb 12.6 oz (81.5 kg), SpO2 100 %.   Physical Exam  Constitutional: He is oriented to person, place, and time. He appears well-developed and well-nourished. No distress.  HENT:  Head: Normocephalic and atraumatic.  Mouth/Throat: Oropharynx is clear and moist. No oropharyngeal exudate.  Sparse short gray hair.  Near alopecia/male pattern baldness.  Mask.  Hearing aid.    Eyes: Pupils are equal, round, and reactive to light. Conjunctivae and EOM are normal. No scleral icterus.  Gold rimmed glasses.  Blue eyes.  Neck:  No JVD present.  Cardiovascular: Normal rate, regular rhythm and normal heart sounds. Exam reveals no gallop.  No murmur heard. Pulmonary/Chest: Effort normal and breath sounds normal. No respiratory distress. He has no wheezes. He has no rales.  Abdominal: Soft. Bowel sounds are normal. He exhibits no distension and no mass. There is no abdominal tenderness. There is no rebound and no guarding.  Musculoskeletal:        General: No tenderness or edema. Normal range of motion.     Cervical back: Normal range of motion.  Lymphadenopathy:       Head (right side): No preauricular, no posterior auricular and no occipital adenopathy present.       Head (left side): No preauricular, no posterior auricular and no occipital adenopathy present.    He has no cervical adenopathy.    He has no axillary adenopathy.       Right: No inguinal and no supraclavicular adenopathy present.       Left: No inguinal and no supraclavicular adenopathy present.  Neurological: He is alert and oriented to person, place, and time. He has normal reflexes.  Skin: Skin is dry. No rash noted. He is not diaphoretic. No erythema. No pallor.  Psychiatric: He has a normal mood and affect. His behavior is normal. Judgment and thought content normal.  Nursing note and vitals  reviewed.   Appointment on 10/23/2019  Component Date Value Ref Range Status  . WBC 10/23/2019 3.2* 4.0 - 10.5 K/uL Final  . RBC 10/23/2019 3.12* 4.22 - 5.81 MIL/uL Final  . Hemoglobin 10/23/2019 8.5* 13.0 - 17.0 g/dL Final  . HCT 10/23/2019 28.4* 39.0 - 52.0 % Final  . MCV 10/23/2019 91.0  80.0 - 100.0 fL Final  . MCH 10/23/2019 27.2  26.0 - 34.0 pg Final  . MCHC 10/23/2019 29.9* 30.0 - 36.0 g/dL Final  . RDW 10/23/2019 16.6* 11.5 - 15.5 % Final  . Platelets 10/23/2019 208  150 - 400 K/uL Final  . nRBC 10/23/2019 1.0* 0.0 - 0.2 % Final  . Neutrophils Relative % 10/23/2019 49  % Final  . Neutro Abs 10/23/2019 1.5* 1.7 - 7.7 K/uL Final  . Lymphocytes Relative 10/23/2019 39  % Final  . Lymphs Abs 10/23/2019 1.2  0.7 - 4.0 K/uL Final  . Monocytes Relative 10/23/2019 11  % Final  . Monocytes Absolute 10/23/2019 0.4  0.1 - 1.0 K/uL Final  . Eosinophils Relative 10/23/2019 0  % Final  . Eosinophils Absolute 10/23/2019 0.0  0.0 - 0.5 K/uL Final  . Basophils Relative 10/23/2019 0  % Final  . Basophils Absolute 10/23/2019 0.0  0.0 - 0.1 K/uL Final  . Immature Granulocytes 10/23/2019 1  % Final  . Abs Immature Granulocytes 10/23/2019 0.03  0.00 - 0.07 K/uL Final   Performed at [facility], [address] [place], [place] [address]    Assessment:  Paul Pham. is a 86 y.o. 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 Bone marrowon 09/22/2017 revealed a hypercellular for age with dyspoietic changes variably involving myeloid cell lines, but with main involvement of the granulocytic/monocytic cell line. This was associated with bone marrow monocytosis and borderline number to slight increase in blastic cells. The overall changes favor a primary myeloid neoplasm, particularly a myelodysplastic syndrome especially refractory anemia with excess blasts (RAEB-1) or possibly refractory cytopenia with multilineage dysplasia. Consideration was also given to an evolving myelodysplastic/myeloproliferative neoplasm  such as chronic myelomonocytic leukemia but the lack of absolute peripheral monocytosis precluded such a diagnosis at  this time. Flow cytometrywas negative. Cytogeneticswere normal (46, XY). Foundation one was positive for ASXL1 [id number], EZH2 Splice site [id number], RUNX1 [id number]. IPSS-R4.5, intermediate group.  Anemia work-up on 08/17/2017: Vitamin B12 (658), folate (22.0), and TSH (2.688). Retic was 2.6%. Haptoglobin was 116 on 08/19/2017. Epo levelwas 104.8 on 10/05/2017.  Ferritin has been followed: 106 on 08/17/2017, 131 on 11/04/2017, 144 on 05/30/2018, 394 on 09/01/2018, 441 on 11/28/2018, 394 on 01/02/2019, 480 on 02/06/2019, 392 on 03/13/2019, 527 on 04/17/2019, 533 on 05/29/2019, 476 on 06/05/2019, 517 on 07/17/2019, 685 on 08/28/2019, and 724 on 10/16/2019. Iron saturation was 34% on 08/17/2017 and 89% on 09/01/2018.  Bone marrowon 09/22/2017 revealed a hypercellular for age with dyspoietic changes variably involving myeloid cell lines, but with main involvement of the granulocytic/monocytic cell line. This was associated with bone marrow monocytosis and borderline number to slight increase in blasts (5%). The overall changes favor a primary myeloid neoplasm, particularly a myelodysplastic syndrome especially refractory anemia with excess blasts (RAEB-1) or possibly refractory cytopenia with multilineage dysplasia. Consideration was also given to an evolving myelodysplastic/myeloproliferative neoplasm such as chronic myelomonocytic leukemia but the lack of absolute peripheral monocytosis precluded such a diagnosis at this time. Flow cytometrywas negative. Cytogeneticswere normal (46, XY). Foundation One was positive for ASXL1 [id number], EZH2 Splice site [id number], RUNX1 [id number].  He has received 19 cycles ofVidaza(11/04/2017 - 08/22/2018; 10/24/2018-09/04/2019). He has received Aranesp(initially 150 mcg every 2 weeks post chemotherapy then 300 mcg every 1-2 weeks after  cycle #3; last 09/19/2018). He last received Aranesp on02/15/2021. He has received 1-2 units of PRBCswith each cycle (last02/17/2021).He has received25units of PRBCs since 08/17/2017.  Bone marrowon 08/12/2018 revealed a hypercellular marrow with dyspoietic changes. There was a borderline number of blasts (5%) seen by morphology. The findings were similar to previous biopsy although the cellularity is slightly higher in the current material.consistent with previously known myelodysplastic syndrome. Flow cytometry was negative. Cytogenetics were normal (46, XY).  He received both Pfizer vaccines for COVID-19 (first on 09/06/2019).   Symptomatically, he is doing well.  Exam is stable.  Plan: 1.   Labs today: CBC with diff, CMP. 2. Refractory anemia with excess blasts (RAEB-1) Clinically,he is doing well. He last received Vidaza 7 weeks ago (09/04/2019).                         Prior cycle length was 7 weeks. Treatmentgoal means to decrease transfusion dependence and prevent acceleration of disease (blasts).   Review counts today.  WBC 3200 with an ANC of 1500. Proceed with cycle #20 Vidaza today.           Continue Aranesp every 2 weeks to decrease transfusion dependence (last 10/18/2019). Aranesp 300 mcg today. 3. Iron overload Ferritin724 on 10/16/2019. He has received25units of PRBCs to date (08/17/2017 -10/18/2019). Discuss initiation of Jadenu as ferritin approaches 1000.                         Discuss postponing initiation for this cycle secondary to leukopenia postponing treatment.   Given potential side effects, plan will be to initiate at 7 mg/kg/day.                         Contact Nuala Alpha, outpatient pharmacist, for update. 4.Cycle #20 Vidaza (day 1-5) this week. 5.   RTC in 1 weeks for labs (CBC with diff, hold tube), +/-  Aranesp. 6.  RTC in 3 weeks for labs (CBC with diff, hold tube), +/- Aranesp. 7.   RTC in5weeks for labs (CBC with diff, hold tube), +/- Aranesp. 8.   RTC in6weeks for MD assessment, labs (CBC with diff, CMP, ferritin, hold tube) and cycle #21 Vidaza (day 1-5).  I discussed the assessment and treatment plan with the patient.  The patient was provided an opportunity to ask questions and all were answered.  The patient agreed with the plan and demonstrated an understanding of the instructions.  The patient was advised to call back if the symptoms worsen or if the condition fails to improve as anticipated.   Lequita Asal, MD, PhD    10/23/2019, 10:23 AM  I, Selena Batten, am acting as scribe for [employer]. Mike Gip, MD, PhD.  I, Anaih Brander C. Mike Gip, MD, have reviewed the above documentation for accuracy and completeness, and I agree with the above.

## 2019-10-19 ENCOUNTER — Inpatient Hospital Stay: Payer: Medicare Other

## 2019-10-19 ENCOUNTER — Other Ambulatory Visit: Payer: Self-pay

## 2019-10-19 ENCOUNTER — Encounter: Payer: Self-pay | Admitting: Hematology and Oncology

## 2019-10-19 LAB — TYPE AND SCREEN
ABO/RH(D): B POS
Antibody Screen: NEGATIVE
Unit division: 0

## 2019-10-19 LAB — BPAM RBC
Blood Product Expiration Date: 202103162359
ISSUE DATE / TIME: 202102171129
Unit Type and Rh: 7300

## 2019-10-19 NOTE — Progress Notes (Signed)
No new changes noted today. The patient name, DOB and medication has been verified by the patient wife over the phone.

## 2019-10-20 ENCOUNTER — Other Ambulatory Visit: Payer: Self-pay | Admitting: *Deleted

## 2019-10-20 ENCOUNTER — Inpatient Hospital Stay: Payer: Medicare Other

## 2019-10-20 DIAGNOSIS — D469 Myelodysplastic syndrome, unspecified: Secondary | ICD-10-CM

## 2019-10-20 DIAGNOSIS — D6481 Anemia due to antineoplastic chemotherapy: Secondary | ICD-10-CM

## 2019-10-23 ENCOUNTER — Inpatient Hospital Stay (HOSPITAL_BASED_OUTPATIENT_CLINIC_OR_DEPARTMENT_OTHER): Payer: Medicare Other | Admitting: Hematology and Oncology

## 2019-10-23 ENCOUNTER — Inpatient Hospital Stay: Payer: Medicare Other

## 2019-10-23 ENCOUNTER — Other Ambulatory Visit: Payer: Self-pay | Admitting: *Deleted

## 2019-10-23 ENCOUNTER — Encounter: Payer: Self-pay | Admitting: Hematology and Oncology

## 2019-10-23 ENCOUNTER — Other Ambulatory Visit: Payer: Self-pay

## 2019-10-23 VITALS — BP 156/59 | HR 69 | Temp 97.1°F | Resp 18 | Wt 179.8 lb

## 2019-10-23 DIAGNOSIS — D469 Myelodysplastic syndrome, unspecified: Secondary | ICD-10-CM

## 2019-10-23 DIAGNOSIS — Z7189 Other specified counseling: Secondary | ICD-10-CM

## 2019-10-23 DIAGNOSIS — Z5111 Encounter for antineoplastic chemotherapy: Secondary | ICD-10-CM

## 2019-10-23 LAB — CBC WITH DIFFERENTIAL/PLATELET
Abs Immature Granulocytes: 0.03 10*3/uL (ref 0.00–0.07)
Basophils Absolute: 0 10*3/uL (ref 0.0–0.1)
Basophils Relative: 0 %
Eosinophils Absolute: 0 10*3/uL (ref 0.0–0.5)
Eosinophils Relative: 0 %
HCT: 28.4 % — ABNORMAL LOW (ref 39.0–52.0)
Hemoglobin: 8.5 g/dL — ABNORMAL LOW (ref 13.0–17.0)
Immature Granulocytes: 1 %
Lymphocytes Relative: 39 %
Lymphs Abs: 1.2 10*3/uL (ref 0.7–4.0)
MCH: 27.2 pg (ref 26.0–34.0)
MCHC: 29.9 g/dL — ABNORMAL LOW (ref 30.0–36.0)
MCV: 91 fL (ref 80.0–100.0)
Monocytes Absolute: 0.4 10*3/uL (ref 0.1–1.0)
Monocytes Relative: 11 %
Neutro Abs: 1.5 10*3/uL — ABNORMAL LOW (ref 1.7–7.7)
Neutrophils Relative %: 49 %
Platelets: 208 10*3/uL (ref 150–400)
RBC: 3.12 MIL/uL — ABNORMAL LOW (ref 4.22–5.81)
RDW: 16.6 % — ABNORMAL HIGH (ref 11.5–15.5)
WBC: 3.2 10*3/uL — ABNORMAL LOW (ref 4.0–10.5)
nRBC: 1 % — ABNORMAL HIGH (ref 0.0–0.2)

## 2019-10-23 LAB — COMPREHENSIVE METABOLIC PANEL
ALT: 16 U/L (ref 0–44)
AST: 20 U/L (ref 15–41)
Albumin: 4.1 g/dL (ref 3.5–5.0)
Alkaline Phosphatase: 27 U/L — ABNORMAL LOW (ref 38–126)
Anion gap: 8 (ref 5–15)
BUN: 27 mg/dL — ABNORMAL HIGH (ref 8–23)
CO2: 24 mmol/L (ref 22–32)
Calcium: 8.5 mg/dL — ABNORMAL LOW (ref 8.9–10.3)
Chloride: 100 mmol/L (ref 98–111)
Creatinine, Ser: 0.89 mg/dL (ref 0.61–1.24)
GFR calc Af Amer: >60 mL/min (ref >60)
GFR calc non Af Amer: >60 mL/min (ref >60)
Glucose, Bld: 186 mg/dL — ABNORMAL HIGH (ref 70–99)
Potassium: 4.4 mmol/L (ref 3.5–5.1)
Sodium: 132 mmol/L — ABNORMAL LOW (ref 135–145)
Total Bilirubin: 0.8 mg/dL (ref 0.3–1.2)
Total Protein: 7.1 g/dL (ref 6.5–8.1)

## 2019-10-23 LAB — SAMPLE TO BLOOD BANK

## 2019-10-23 MED ORDER — AZACITIDINE CHEMO SQ INJECTION
75.0000 mg/m2 | Freq: Once | INTRAMUSCULAR | Status: AC
  Administered 2019-10-23: 12:00:00 155 mg via SUBCUTANEOUS
  Filled 2019-10-23: qty 6.2

## 2019-10-23 MED ORDER — ONDANSETRON HCL 4 MG PO TABS: 8.0000 mg | ORAL_TABLET | Freq: Once | ORAL | Status: DC

## 2019-10-24 ENCOUNTER — Inpatient Hospital Stay: Payer: Medicare Other

## 2019-10-24 VITALS — BP 145/55 | HR 74 | Temp 98.2°F | Resp 16

## 2019-10-24 DIAGNOSIS — D469 Myelodysplastic syndrome, unspecified: Secondary | ICD-10-CM

## 2019-10-24 DIAGNOSIS — Z5111 Encounter for antineoplastic chemotherapy: Secondary | ICD-10-CM | POA: Diagnosis not present

## 2019-10-24 MED ORDER — AZACITIDINE CHEMO SQ INJECTION
75.0000 mg/m2 | Freq: Once | INTRAMUSCULAR | Status: AC
  Administered 2019-10-24: 155 mg via SUBCUTANEOUS
  Filled 2019-10-24: qty 6.2

## 2019-10-24 MED ORDER — ONDANSETRON HCL 4 MG PO TABS: 8.0000 mg | ORAL_TABLET | Freq: Once | ORAL | Status: DC

## 2019-10-24 NOTE — Patient Instructions (Signed)
Azacitidine suspension for injection (subcutaneous use) What is this medicine? AZACITIDINE (ay za SITE i deen) is a chemotherapy drug. This medicine reduces the growth of cancer cells and can suppress the immune system. It is used for treating myelodysplastic syndrome or some types of leukemia. This medicine may be used for other purposes; ask your health care provider or pharmacist if you have questions. COMMON BRAND NAME(S): Vidaza What should I tell my health care provider before I take this medicine? They need to know if you have any of these conditions:  kidney disease  liver disease  liver tumors  an unusual or allergic reaction to azacitidine, mannitol, other medicines, foods, dyes, or preservatives  pregnant or trying to get pregnant  breast-feeding How should I use this medicine? This medicine is for injection under the skin. It is administered in a hospital or clinic by a specially trained health care professional. Talk to your pediatrician regarding the use of this medicine in children. While this drug may be prescribed for selected conditions, precautions do apply. Overdosage: If you think you have taken too much of this medicine contact a poison control center or emergency room at once. NOTE: This medicine is only for you. Do not share this medicine with others. What if I miss a dose? It is important not to miss your dose. Call your doctor or health care professional if you are unable to keep an appointment. What may interact with this medicine? Interactions have not been studied. Give your health care provider a list of all the medicines, herbs, non-prescription drugs, or dietary supplements you use. Also tell them if you smoke, drink alcohol, or use illegal drugs. Some items may interact with your medicine. This list may not describe all possible interactions. Give your health care provider a list of all the medicines, herbs, non-prescription drugs, or dietary supplements  you use. Also tell them if you smoke, drink alcohol, or use illegal drugs. Some items may interact with your medicine. What should I watch for while using this medicine? Visit your doctor for checks on your progress. This drug may make you feel generally unwell. This is not uncommon, as chemotherapy can affect healthy cells as well as cancer cells. Report any side effects. Continue your course of treatment even though you feel ill unless your doctor tells you to stop. In some cases, you may be given additional medicines to help with side effects. Follow all directions for their use. Call your doctor or health care professional for advice if you get a fever, chills or sore throat, or other symptoms of a cold or flu. Do not treat yourself. This drug decreases your body's ability to fight infections. Try to avoid being around people who are sick. This medicine may increase your risk to bruise or bleed. Call your doctor or health care professional if you notice any unusual bleeding. You may need blood work done while you are taking this medicine. Do not become pregnant while taking this medicine and for 6 months after the last dose. Women should inform their doctor if they wish to become pregnant or think they might be pregnant. Men should not father a child while taking this medicine and for 3 months after the last dose. There is a potential for serious side effects to an unborn child. Talk to your health care professional or pharmacist for more information. Do not breast-feed an infant while taking this medicine and for 1 week after the last dose. This medicine may interfere with   the ability to have a child. Talk with your doctor or health care professional if you are concerned about your fertility. What side effects may I notice from receiving this medicine? Side effects that you should report to your doctor or health care professional as soon as possible:  allergic reactions like skin rash, itching or  hives, swelling of the face, lips, or tongue  low blood counts - this medicine may decrease the number of white blood cells, red blood cells and platelets. You may be at increased risk for infections and bleeding.  signs of infection - fever or chills, cough, sore throat, pain passing urine  signs of decreased platelets or bleeding - bruising, pinpoint red spots on the skin, black, tarry stools, blood in the urine  signs of decreased red blood cells - unusually weak or tired, fainting spells, lightheadedness  signs and symptoms of kidney injury like trouble passing urine or change in the amount of urine  signs and symptoms of liver injury like dark yellow or brown urine; general ill feeling or flu-like symptoms; light-colored stools; loss of appetite; nausea; right upper belly pain; unusually weak or tired; yellowing of the eyes or skin Side effects that usually do not require medical attention (report to your doctor or health care professional if they continue or are bothersome):  constipation  diarrhea  nausea, vomiting  pain or redness at the injection site  unusually weak or tired This list may not describe all possible side effects. Call your doctor for medical advice about side effects. You may report side effects to FDA at 1-800-FDA-1088. Where should I keep my medicine? This drug is given in a hospital or clinic and will not be stored at home. NOTE: This sheet is a summary. It may not cover all possible information. If you have questions about this medicine, talk to your doctor, pharmacist, or health care provider.  2020 Elsevier/Gold Standard (2016-09-15 14:37:51)  

## 2019-10-25 ENCOUNTER — Other Ambulatory Visit: Payer: Self-pay

## 2019-10-25 ENCOUNTER — Inpatient Hospital Stay: Payer: Medicare Other

## 2019-10-25 VITALS — BP 151/70 | HR 85 | Temp 98.6°F | Resp 18

## 2019-10-25 DIAGNOSIS — Z5111 Encounter for antineoplastic chemotherapy: Secondary | ICD-10-CM | POA: Diagnosis not present

## 2019-10-25 DIAGNOSIS — D469 Myelodysplastic syndrome, unspecified: Secondary | ICD-10-CM

## 2019-10-25 MED ORDER — AZACITIDINE CHEMO SQ INJECTION
75.0000 mg/m2 | Freq: Once | INTRAMUSCULAR | Status: AC
  Administered 2019-10-25: 09:00:00 155 mg via SUBCUTANEOUS
  Filled 2019-10-25: qty 6.2

## 2019-10-25 MED ORDER — ONDANSETRON HCL 4 MG PO TABS: 8.0000 mg | ORAL_TABLET | Freq: Once | ORAL | Status: DC

## 2019-10-25 NOTE — Patient Instructions (Signed)
Azacitidine suspension for injection (subcutaneous use) What is this medicine? AZACITIDINE (ay za SITE i deen) is a chemotherapy drug. This medicine reduces the growth of cancer cells and can suppress the immune system. It is used for treating myelodysplastic syndrome or some types of leukemia. This medicine may be used for other purposes; ask your health care provider or pharmacist if you have questions. COMMON BRAND NAME(S): Vidaza What should I tell my health care provider before I take this medicine? They need to know if you have any of these conditions:  kidney disease  liver disease  liver tumors  an unusual or allergic reaction to azacitidine, mannitol, other medicines, foods, dyes, or preservatives  pregnant or trying to get pregnant  breast-feeding How should I use this medicine? This medicine is for injection under the skin. It is administered in a hospital or clinic by a specially trained health care professional. Talk to your pediatrician regarding the use of this medicine in children. While this drug may be prescribed for selected conditions, precautions do apply. Overdosage: If you think you have taken too much of this medicine contact a poison control center or emergency room at once. NOTE: This medicine is only for you. Do not share this medicine with others. What if I miss a dose? It is important not to miss your dose. Call your doctor or health care professional if you are unable to keep an appointment. What may interact with this medicine? Interactions have not been studied. Give your health care provider a list of all the medicines, herbs, non-prescription drugs, or dietary supplements you use. Also tell them if you smoke, drink alcohol, or use illegal drugs. Some items may interact with your medicine. This list may not describe all possible interactions. Give your health care provider a list of all the medicines, herbs, non-prescription drugs, or dietary supplements  you use. Also tell them if you smoke, drink alcohol, or use illegal drugs. Some items may interact with your medicine. What should I watch for while using this medicine? Visit your doctor for checks on your progress. This drug may make you feel generally unwell. This is not uncommon, as chemotherapy can affect healthy cells as well as cancer cells. Report any side effects. Continue your course of treatment even though you feel ill unless your doctor tells you to stop. In some cases, you may be given additional medicines to help with side effects. Follow all directions for their use. Call your doctor or health care professional for advice if you get a fever, chills or sore throat, or other symptoms of a cold or flu. Do not treat yourself. This drug decreases your body's ability to fight infections. Try to avoid being around people who are sick. This medicine may increase your risk to bruise or bleed. Call your doctor or health care professional if you notice any unusual bleeding. You may need blood work done while you are taking this medicine. Do not become pregnant while taking this medicine and for 6 months after the last dose. Women should inform their doctor if they wish to become pregnant or think they might be pregnant. Men should not father a child while taking this medicine and for 3 months after the last dose. There is a potential for serious side effects to an unborn child. Talk to your health care professional or pharmacist for more information. Do not breast-feed an infant while taking this medicine and for 1 week after the last dose. This medicine may interfere with   the ability to have a child. Talk with your doctor or health care professional if you are concerned about your fertility. What side effects may I notice from receiving this medicine? Side effects that you should report to your doctor or health care professional as soon as possible:  allergic reactions like skin rash, itching or  hives, swelling of the face, lips, or tongue  low blood counts - this medicine may decrease the number of white blood cells, red blood cells and platelets. You may be at increased risk for infections and bleeding.  signs of infection - fever or chills, cough, sore throat, pain passing urine  signs of decreased platelets or bleeding - bruising, pinpoint red spots on the skin, black, tarry stools, blood in the urine  signs of decreased red blood cells - unusually weak or tired, fainting spells, lightheadedness  signs and symptoms of kidney injury like trouble passing urine or change in the amount of urine  signs and symptoms of liver injury like dark yellow or brown urine; general ill feeling or flu-like symptoms; light-colored stools; loss of appetite; nausea; right upper belly pain; unusually weak or tired; yellowing of the eyes or skin Side effects that usually do not require medical attention (report to your doctor or health care professional if they continue or are bothersome):  constipation  diarrhea  nausea, vomiting  pain or redness at the injection site  unusually weak or tired This list may not describe all possible side effects. Call your doctor for medical advice about side effects. You may report side effects to FDA at 1-800-FDA-1088. Where should I keep my medicine? This drug is given in a hospital or clinic and will not be stored at home. NOTE: This sheet is a summary. It may not cover all possible information. If you have questions about this medicine, talk to your doctor, pharmacist, or health care provider.  2020 Elsevier/Gold Standard (2016-09-15 14:37:51)  

## 2019-10-26 ENCOUNTER — Inpatient Hospital Stay: Payer: Medicare Other

## 2019-10-26 VITALS — BP 128/70 | HR 73 | Temp 98.4°F | Resp 18

## 2019-10-26 DIAGNOSIS — D469 Myelodysplastic syndrome, unspecified: Secondary | ICD-10-CM

## 2019-10-26 DIAGNOSIS — Z5111 Encounter for antineoplastic chemotherapy: Secondary | ICD-10-CM | POA: Diagnosis not present

## 2019-10-26 MED ORDER — AZACITIDINE CHEMO SQ INJECTION
75.0000 mg/m2 | Freq: Once | INTRAMUSCULAR | Status: AC
  Administered 2019-10-26: 155 mg via SUBCUTANEOUS
  Filled 2019-10-26: qty 6.2

## 2019-10-26 MED ORDER — ONDANSETRON HCL 4 MG PO TABS: 8.0000 mg | ORAL_TABLET | Freq: Once | ORAL | Status: DC

## 2019-10-27 ENCOUNTER — Inpatient Hospital Stay: Payer: Medicare Other

## 2019-10-27 ENCOUNTER — Other Ambulatory Visit: Payer: Self-pay

## 2019-10-27 VITALS — BP 131/71 | HR 73 | Temp 97.7°F | Resp 18

## 2019-10-27 DIAGNOSIS — D469 Myelodysplastic syndrome, unspecified: Secondary | ICD-10-CM

## 2019-10-27 DIAGNOSIS — Z5111 Encounter for antineoplastic chemotherapy: Secondary | ICD-10-CM | POA: Diagnosis not present

## 2019-10-27 MED ORDER — ONDANSETRON HCL 4 MG PO TABS: 8.0000 mg | ORAL_TABLET | Freq: Once | ORAL | Status: DC

## 2019-10-27 MED ORDER — AZACITIDINE CHEMO SQ INJECTION
75.0000 mg/m2 | Freq: Once | INTRAMUSCULAR | Status: AC
  Administered 2019-10-27: 155 mg via SUBCUTANEOUS
  Filled 2019-10-27: qty 6.2

## 2019-10-27 NOTE — Patient Instructions (Signed)
Azacitidine suspension for injection (subcutaneous use) What is this medicine? AZACITIDINE (ay za SITE i deen) is a chemotherapy drug. This medicine reduces the growth of cancer cells and can suppress the immune system. It is used for treating myelodysplastic syndrome or some types of leukemia. This medicine may be used for other purposes; ask your health care provider or pharmacist if you have questions. COMMON BRAND NAME(S): Vidaza What should I tell my health care provider before I take this medicine? They need to know if you have any of these conditions:  kidney disease  liver disease  liver tumors  an unusual or allergic reaction to azacitidine, mannitol, other medicines, foods, dyes, or preservatives  pregnant or trying to get pregnant  breast-feeding How should I use this medicine? This medicine is for injection under the skin. It is administered in a hospital or clinic by a specially trained health care professional. Talk to your pediatrician regarding the use of this medicine in children. While this drug may be prescribed for selected conditions, precautions do apply. Overdosage: If you think you have taken too much of this medicine contact a poison control center or emergency room at once. NOTE: This medicine is only for you. Do not share this medicine with others. What if I miss a dose? It is important not to miss your dose. Call your doctor or health care professional if you are unable to keep an appointment. What may interact with this medicine? Interactions have not been studied. Give your health care provider a list of all the medicines, herbs, non-prescription drugs, or dietary supplements you use. Also tell them if you smoke, drink alcohol, or use illegal drugs. Some items may interact with your medicine. This list may not describe all possible interactions. Give your health care provider a list of all the medicines, herbs, non-prescription drugs, or dietary supplements  you use. Also tell them if you smoke, drink alcohol, or use illegal drugs. Some items may interact with your medicine. What should I watch for while using this medicine? Visit your doctor for checks on your progress. This drug may make you feel generally unwell. This is not uncommon, as chemotherapy can affect healthy cells as well as cancer cells. Report any side effects. Continue your course of treatment even though you feel ill unless your doctor tells you to stop. In some cases, you may be given additional medicines to help with side effects. Follow all directions for their use. Call your doctor or health care professional for advice if you get a fever, chills or sore throat, or other symptoms of a cold or flu. Do not treat yourself. This drug decreases your body's ability to fight infections. Try to avoid being around people who are sick. This medicine may increase your risk to bruise or bleed. Call your doctor or health care professional if you notice any unusual bleeding. You may need blood work done while you are taking this medicine. Do not become pregnant while taking this medicine and for 6 months after the last dose. Women should inform their doctor if they wish to become pregnant or think they might be pregnant. Men should not father a child while taking this medicine and for 3 months after the last dose. There is a potential for serious side effects to an unborn child. Talk to your health care professional or pharmacist for more information. Do not breast-feed an infant while taking this medicine and for 1 week after the last dose. This medicine may interfere with   the ability to have a child. Talk with your doctor or health care professional if you are concerned about your fertility. What side effects may I notice from receiving this medicine? Side effects that you should report to your doctor or health care professional as soon as possible:  allergic reactions like skin rash, itching or  hives, swelling of the face, lips, or tongue  low blood counts - this medicine may decrease the number of white blood cells, red blood cells and platelets. You may be at increased risk for infections and bleeding.  signs of infection - fever or chills, cough, sore throat, pain passing urine  signs of decreased platelets or bleeding - bruising, pinpoint red spots on the skin, black, tarry stools, blood in the urine  signs of decreased red blood cells - unusually weak or tired, fainting spells, lightheadedness  signs and symptoms of kidney injury like trouble passing urine or change in the amount of urine  signs and symptoms of liver injury like dark yellow or brown urine; general ill feeling or flu-like symptoms; light-colored stools; loss of appetite; nausea; right upper belly pain; unusually weak or tired; yellowing of the eyes or skin Side effects that usually do not require medical attention (report to your doctor or health care professional if they continue or are bothersome):  constipation  diarrhea  nausea, vomiting  pain or redness at the injection site  unusually weak or tired This list may not describe all possible side effects. Call your doctor for medical advice about side effects. You may report side effects to FDA at 1-800-FDA-1088. Where should I keep my medicine? This drug is given in a hospital or clinic and will not be stored at home. NOTE: This sheet is a summary. It may not cover all possible information. If you have questions about this medicine, talk to your doctor, pharmacist, or health care provider.  2020 Elsevier/Gold Standard (2016-09-15 14:37:51)  

## 2019-10-30 ENCOUNTER — Inpatient Hospital Stay: Payer: Medicare Other

## 2019-10-30 ENCOUNTER — Other Ambulatory Visit: Payer: Self-pay

## 2019-10-30 ENCOUNTER — Inpatient Hospital Stay: Payer: Medicare Other | Attending: Hematology and Oncology

## 2019-10-30 ENCOUNTER — Other Ambulatory Visit: Payer: Self-pay | Admitting: Hematology and Oncology

## 2019-10-30 VITALS — BP 131/65 | HR 86 | Temp 97.8°F | Resp 18

## 2019-10-30 DIAGNOSIS — Z7189 Other specified counseling: Secondary | ICD-10-CM

## 2019-10-30 DIAGNOSIS — D4621 Refractory anemia with excess of blasts 1: Secondary | ICD-10-CM | POA: Insufficient documentation

## 2019-10-30 DIAGNOSIS — D6481 Anemia due to antineoplastic chemotherapy: Secondary | ICD-10-CM

## 2019-10-30 DIAGNOSIS — D469 Myelodysplastic syndrome, unspecified: Secondary | ICD-10-CM

## 2019-10-30 LAB — SAMPLE TO BLOOD BANK

## 2019-10-30 LAB — CBC WITH DIFFERENTIAL/PLATELET
Abs Immature Granulocytes: 0.07 10*3/uL (ref 0.00–0.07)
Basophils Absolute: 0 10*3/uL (ref 0.0–0.1)
Basophils Relative: 0 %
Eosinophils Absolute: 0 10*3/uL (ref 0.0–0.5)
Eosinophils Relative: 0 %
HCT: 25.3 % — ABNORMAL LOW (ref 39.0–52.0)
Hemoglobin: 7.5 g/dL — ABNORMAL LOW (ref 13.0–17.0)
Immature Granulocytes: 3 %
Lymphocytes Relative: 39 %
Lymphs Abs: 1.1 10*3/uL (ref 0.7–4.0)
MCH: 26.7 pg (ref 26.0–34.0)
MCHC: 29.6 g/dL — ABNORMAL LOW (ref 30.0–36.0)
MCV: 90 fL (ref 80.0–100.0)
Monocytes Absolute: 0.3 10*3/uL (ref 0.1–1.0)
Monocytes Relative: 9 %
Neutro Abs: 1.4 10*3/uL — ABNORMAL LOW (ref 1.7–7.7)
Neutrophils Relative %: 49 %
Platelets: 166 10*3/uL (ref 150–400)
RBC: 2.81 MIL/uL — ABNORMAL LOW (ref 4.22–5.81)
RDW: 16.1 % — ABNORMAL HIGH (ref 11.5–15.5)
WBC: 2.8 10*3/uL — ABNORMAL LOW (ref 4.0–10.5)
nRBC: 0 % (ref 0.0–0.2)

## 2019-10-30 MED ORDER — DARBEPOETIN ALFA 300 MCG/0.6ML IJ SOSY
300.0000 ug | PREFILLED_SYRINGE | Freq: Once | INTRAMUSCULAR | Status: AC
  Administered 2019-10-30: 300 ug via SUBCUTANEOUS

## 2019-10-31 ENCOUNTER — Ambulatory Visit: Payer: TRICARE For Life (TFL)

## 2019-11-01 ENCOUNTER — Ambulatory Visit: Payer: TRICARE For Life (TFL)

## 2019-11-02 ENCOUNTER — Telehealth: Payer: Self-pay | Admitting: *Deleted

## 2019-11-02 ENCOUNTER — Other Ambulatory Visit: Payer: Self-pay

## 2019-11-02 ENCOUNTER — Inpatient Hospital Stay: Payer: Medicare Other

## 2019-11-02 ENCOUNTER — Ambulatory Visit: Payer: TRICARE For Life (TFL)

## 2019-11-02 ENCOUNTER — Other Ambulatory Visit: Payer: Self-pay | Admitting: *Deleted

## 2019-11-02 DIAGNOSIS — D469 Myelodysplastic syndrome, unspecified: Secondary | ICD-10-CM

## 2019-11-02 DIAGNOSIS — D4621 Refractory anemia with excess of blasts 1: Secondary | ICD-10-CM | POA: Diagnosis not present

## 2019-11-02 LAB — CBC WITH DIFFERENTIAL/PLATELET
Abs Immature Granulocytes: 0.02 10*3/uL (ref 0.00–0.07)
Basophils Absolute: 0 10*3/uL (ref 0.0–0.1)
Basophils Relative: 0 %
Eosinophils Absolute: 0 10*3/uL (ref 0.0–0.5)
Eosinophils Relative: 1 %
HCT: 22.4 % — ABNORMAL LOW (ref 39.0–52.0)
Hemoglobin: 6.7 g/dL — ABNORMAL LOW (ref 13.0–17.0)
Immature Granulocytes: 1 %
Lymphocytes Relative: 56 %
Lymphs Abs: 1.1 10*3/uL (ref 0.7–4.0)
MCH: 27.2 pg (ref 26.0–34.0)
MCHC: 29.9 g/dL — ABNORMAL LOW (ref 30.0–36.0)
MCV: 91.1 fL (ref 80.0–100.0)
Monocytes Absolute: 0.2 10*3/uL (ref 0.1–1.0)
Monocytes Relative: 10 %
Neutro Abs: 0.6 10*3/uL — ABNORMAL LOW (ref 1.7–7.7)
Neutrophils Relative %: 32 %
Platelets: 138 10*3/uL — ABNORMAL LOW (ref 150–400)
RBC: 2.46 MIL/uL — ABNORMAL LOW (ref 4.22–5.81)
RDW: 16.3 % — ABNORMAL HIGH (ref 11.5–15.5)
WBC: 1.9 10*3/uL — ABNORMAL LOW (ref 4.0–10.5)
nRBC: 1.1 % — ABNORMAL HIGH (ref 0.0–0.2)

## 2019-11-02 LAB — SAMPLE TO BLOOD BANK

## 2019-11-02 LAB — PREPARE RBC (CROSSMATCH)

## 2019-11-02 NOTE — Telephone Encounter (Signed)
Patient was here this morning for his labwork.  He and his wife were informed of hemoglobin level and the need for a unit of PRBCs tomorrow.  He was also instructed to follow neutropenic precautions due to his white count being low.  MD informed as well.  Patient verbalized understanding.

## 2019-11-03 ENCOUNTER — Ambulatory Visit: Payer: TRICARE For Life (TFL)

## 2019-11-03 ENCOUNTER — Inpatient Hospital Stay: Payer: Medicare Other

## 2019-11-03 DIAGNOSIS — D4621 Refractory anemia with excess of blasts 1: Secondary | ICD-10-CM | POA: Diagnosis not present

## 2019-11-03 DIAGNOSIS — D469 Myelodysplastic syndrome, unspecified: Secondary | ICD-10-CM

## 2019-11-03 MED ORDER — SODIUM CHLORIDE 0.9% IV SOLUTION
250.0000 mL | Freq: Once | INTRAVENOUS | Status: AC
  Administered 2019-11-03: 250 mL via INTRAVENOUS
  Filled 2019-11-03: qty 250

## 2019-11-03 MED ORDER — DIPHENHYDRAMINE HCL 25 MG PO CAPS: 25.0000 mg | ORAL_CAPSULE | Freq: Once | ORAL | Status: DC

## 2019-11-03 MED ORDER — ACETAMINOPHEN 325 MG PO TABS: 650.0000 mg | ORAL_TABLET | Freq: Once | ORAL | Status: DC

## 2019-11-03 NOTE — Patient Instructions (Signed)

## 2019-11-04 LAB — TYPE AND SCREEN
ABO/RH(D): B POS
Antibody Screen: NEGATIVE
Unit division: 0

## 2019-11-04 LAB — BPAM RBC
Blood Product Expiration Date: 202103092359
ISSUE DATE / TIME: 202103051000
Unit Type and Rh: 9500

## 2019-11-13 ENCOUNTER — Ambulatory Visit (INDEPENDENT_AMBULATORY_CARE_PROVIDER_SITE_OTHER): Payer: Medicare Other | Admitting: Dermatology

## 2019-11-13 ENCOUNTER — Encounter: Payer: Self-pay | Admitting: Dermatology

## 2019-11-13 ENCOUNTER — Inpatient Hospital Stay: Payer: Medicare Other

## 2019-11-13 ENCOUNTER — Other Ambulatory Visit: Payer: Self-pay

## 2019-11-13 DIAGNOSIS — L821 Other seborrheic keratosis: Secondary | ICD-10-CM | POA: Diagnosis not present

## 2019-11-13 DIAGNOSIS — D492 Neoplasm of unspecified behavior of bone, soft tissue, and skin: Secondary | ICD-10-CM

## 2019-11-13 DIAGNOSIS — L570 Actinic keratosis: Secondary | ICD-10-CM | POA: Diagnosis not present

## 2019-11-13 DIAGNOSIS — D485 Neoplasm of uncertain behavior of skin: Secondary | ICD-10-CM | POA: Diagnosis not present

## 2019-11-13 DIAGNOSIS — Z1283 Encounter for screening for malignant neoplasm of skin: Secondary | ICD-10-CM

## 2019-11-13 DIAGNOSIS — L814 Other melanin hyperpigmentation: Secondary | ICD-10-CM

## 2019-11-13 DIAGNOSIS — D1801 Hemangioma of skin and subcutaneous tissue: Secondary | ICD-10-CM

## 2019-11-13 DIAGNOSIS — L820 Inflamed seborrheic keratosis: Secondary | ICD-10-CM

## 2019-11-13 DIAGNOSIS — L578 Other skin changes due to chronic exposure to nonionizing radiation: Secondary | ICD-10-CM

## 2019-11-13 DIAGNOSIS — Z85828 Personal history of other malignant neoplasm of skin: Secondary | ICD-10-CM

## 2019-11-13 NOTE — Progress Notes (Signed)
   Follow-Up Visit   Subjective  Paul Pham. is a 86 y.o. male who presents for the following: Annual Exam (Spots on scalp and left ear).  The following portions of the chart were reviewed this encounter and updated as appropriate:     Review of Systems: No other skin or systemic complaints.  Objective  Well appearing patient in no apparent distress; mood and affect are within normal limits.  A full examination was performed including scalp, head, eyes, ears, nose, lips, neck, chest, axillae, abdomen, back, buttocks, bilateral upper extremities, bilateral lower extremities, hands, feet, fingers, toes, fingernails, and toenails. All findings within normal limits unless otherwise noted below.  Objective  Chest - Medial [facility]): trunk  Objective  mid sternum:   Objective  left sup lat forehead:   Objective  Head - Anterior (Face): Face and shoulders  Objective  left ear post earlobe: 1.2 x 1.0 cm crusted papule       Objective  Chest - Medial (Center): Trunk and extremities   Assessment & Plan  AK (actinic keratosis) scalp, face, arms  Destruction of lesion - scalp, face, arms  Destruction method: cryotherapy   Lesion destroyed using liquid nitrogen: Yes (x 16)   Outcome: patient tolerated procedure well with no complications   Post-procedure details: wound care instructions given    Actinic skin damage generalized all over skin  Hemangioma of skin multiple of trunk  History of SCC (squamous cell carcinoma) of skin mid sternum  History of basal cell carcinoma (BCC) left sup lat forehead  Inflamed seborrheic keratosis Scalp  Destruction of lesion - Scalp  Destruction method: cryotherapy   Lesion destroyed using liquid nitrogen: Yes (x 4)   Outcome: patient tolerated procedure well with no complications   Post-procedure details: wound care instructions given    Lentigines all over skin  Neoplasm of skin left ear post earlobe  Skin  / nail biopsy Type of biopsy: tangential   Anesthesia: the lesion was anesthetized in a standard fashion   Anesthetic:  1% lidocaine w/ epinephrine 1-100,000 local infiltration Instrument used: #15 blade   Hemostasis achieved with: pressure and electrodesiccation   Outcome: patient tolerated procedure well   Post-procedure details: wound care instructions given   Additional details:  Will plan excision if +  Specimen 1 - Surgical pathology Differential Diagnosis: BCC vs SCC vs other  Check Margins: No 1.2 x 1.0 cm crusted papule  Seborrheic keratosis  multiple over trunk and extremities  Return in about 6 months (around 05/15/2020) for UBSE.

## 2019-11-14 ENCOUNTER — Telehealth: Payer: Self-pay

## 2019-11-14 ENCOUNTER — Other Ambulatory Visit: Payer: Self-pay | Admitting: Hematology and Oncology

## 2019-11-14 ENCOUNTER — Inpatient Hospital Stay: Payer: Medicare Other

## 2019-11-14 VITALS — BP 126/54 | HR 76 | Temp 98.3°F | Resp 18

## 2019-11-14 DIAGNOSIS — D4621 Refractory anemia with excess of blasts 1: Secondary | ICD-10-CM | POA: Diagnosis not present

## 2019-11-14 DIAGNOSIS — D469 Myelodysplastic syndrome, unspecified: Secondary | ICD-10-CM

## 2019-11-14 LAB — CBC WITH DIFFERENTIAL/PLATELET
Abs Immature Granulocytes: 0.02 10*3/uL (ref 0.00–0.07)
Basophils Absolute: 0 10*3/uL (ref 0.0–0.1)
Basophils Relative: 0 %
Eosinophils Absolute: 0 10*3/uL (ref 0.0–0.5)
Eosinophils Relative: 0 %
HCT: 23.8 % — ABNORMAL LOW (ref 39.0–52.0)
Hemoglobin: 7.3 g/dL — ABNORMAL LOW (ref 13.0–17.0)
Immature Granulocytes: 1 %
Lymphocytes Relative: 44 %
Lymphs Abs: 1.2 10*3/uL (ref 0.7–4.0)
MCH: 27.8 pg (ref 26.0–34.0)
MCHC: 30.7 g/dL (ref 30.0–36.0)
MCV: 90.5 fL (ref 80.0–100.0)
Monocytes Absolute: 0.2 10*3/uL (ref 0.1–1.0)
Monocytes Relative: 8 %
Neutro Abs: 1.4 10*3/uL — ABNORMAL LOW (ref 1.7–7.7)
Neutrophils Relative %: 47 %
Platelets: 168 10*3/uL (ref 150–400)
RBC: 2.63 MIL/uL — ABNORMAL LOW (ref 4.22–5.81)
RDW: 17.2 % — ABNORMAL HIGH (ref 11.5–15.5)
WBC: 2.8 10*3/uL — ABNORMAL LOW (ref 4.0–10.5)
nRBC: 0.7 % — ABNORMAL HIGH (ref 0.0–0.2)

## 2019-11-14 LAB — SAMPLE TO BLOOD BANK

## 2019-11-14 MED ORDER — DARBEPOETIN ALFA 300 MCG/0.6ML IJ SOSY
300.0000 ug | PREFILLED_SYRINGE | Freq: Once | INTRAMUSCULAR | Status: AC
  Administered 2019-11-14: 300 ug via SUBCUTANEOUS

## 2019-11-14 NOTE — Telephone Encounter (Signed)
Inbound call from patient's wife, Inez Catalina, returning call. Informed wife and patient of lab results. Patient states he is fatigued and would like to receive a transfusion at this time. Informed Dr. Mike Gip of this and message sent to Robin to schedule. Informed patient Shirlean Mylar would be reaching out to them to schedule. Patient verbalizes understanding and denies any further questions or concerns.

## 2019-11-14 NOTE — Telephone Encounter (Signed)
-----   Message from Lequita Asal, MD sent at 11/14/2019  3:33 PM EDT ----- Regarding: Please call patient  Review today's CBC.  Counts have improved.  Does he feel like he needs a transfusion this week?  M ----- Message ----- From: Buel Ream, Lab In [place] Sent: 11/14/2019   1:27 PM EDT To: Lequita Asal, MD

## 2019-11-14 NOTE — Patient Instructions (Signed)
Darbepoetin Alfa injection What is this medicine? DARBEPOETIN ALFA (dar be POE e tin AL fa) helps your body make more red blood cells. It is used to treat anemia caused by chronic kidney failure and chemotherapy. This medicine may be used for other purposes; ask your health care provider or pharmacist if you have questions. COMMON BRAND NAME(S): Aranesp What should I tell my health care provider before I take this medicine? They need to know if you have any of these conditions:  blood clotting disorders or history of blood clots  cancer patient not on chemotherapy  cystic fibrosis  heart disease, such as angina, heart failure, or a history of a heart attack  hemoglobin level of 12 g/dL or greater  high blood pressure  low levels of folate, iron, or vitamin B12  seizures  an unusual or allergic reaction to darbepoetin, erythropoietin, albumin, hamster proteins, latex, other medicines, foods, dyes, or preservatives  pregnant or trying to get pregnant  breast-feeding How should I use this medicine? This medicine is for injection into a vein or under the skin. It is usually given by a health care professional in a hospital or clinic setting. If you get this medicine at home, you will be taught how to prepare and give this medicine. Use exactly as directed. Take your medicine at regular intervals. Do not take your medicine more often than directed. It is important that you put your used needles and syringes in a special sharps container. Do not put them in a trash can. If you do not have a sharps container, call your pharmacist or healthcare provider to get one. A special MedGuide will be given to you by the pharmacist with each prescription and refill. Be sure to read this information carefully each time. Talk to your pediatrician regarding the use of this medicine in children. While this medicine may be used in children as young as 1 month of age for selected conditions, precautions do  apply. Overdosage: If you think you have taken too much of this medicine contact a poison control center or emergency room at once. NOTE: This medicine is only for you. Do not share this medicine with others. What if I miss a dose? If you miss a dose, take it as soon as you can. If it is almost time for your next dose, take only that dose. Do not take double or extra doses. What may interact with this medicine? Do not take this medicine with any of the following medications:  epoetin alfa This list may not describe all possible interactions. Give your health care provider a list of all the medicines, herbs, non-prescription drugs, or dietary supplements you use. Also tell them if you smoke, drink alcohol, or use illegal drugs. Some items may interact with your medicine. What should I watch for while using this medicine? Your condition will be monitored carefully while you are receiving this medicine. You may need blood work done while you are taking this medicine. This medicine may cause a decrease in vitamin B6. You should make sure that you get enough vitamin B6 while you are taking this medicine. Discuss the foods you eat and the vitamins you take with your health care professional. What side effects may I notice from receiving this medicine? Side effects that you should report to your doctor or health care professional as soon as possible:  allergic reactions like skin rash, itching or hives, swelling of the face, lips, or tongue  breathing problems  changes in   vision  chest pain  confusion, trouble speaking or understanding  feeling faint or lightheaded, falls  high blood pressure  muscle aches or pains  pain, swelling, warmth in the leg  rapid weight gain  severe headaches  sudden numbness or weakness of the face, arm or leg  trouble walking, dizziness, loss of balance or coordination  seizures (convulsions)  swelling of the ankles, feet, hands  unusually weak or  tired Side effects that usually do not require medical attention (report to your doctor or health care professional if they continue or are bothersome):  diarrhea  fever, chills (flu-like symptoms)  headaches  nausea, vomiting  redness, stinging, or swelling at site where injected This list may not describe all possible side effects. Call your doctor for medical advice about side effects. You may report side effects to FDA at 1-800-FDA-1088. Where should I keep my medicine? Keep out of the reach of children. Store in a refrigerator between 2 and 8 degrees C (36 and 46 degrees F). Do not freeze. Do not shake. Throw away any unused portion if using a single-dose vial. Throw away any unused medicine after the expiration date. NOTE: This sheet is a summary. It may not cover all possible information. If you have questions about this medicine, talk to your doctor, pharmacist, or health care provider.  2020 [employer] Standard (2017-09-01 16:44:20)  

## 2019-11-14 NOTE — Telephone Encounter (Signed)
Attempted to contact patient to review lab work and to question if he felt he needed a transfusion this week. VM left requesting callback.

## 2019-11-14 NOTE — Telephone Encounter (Signed)
-----   Message from Lequita Asal, MD sent at 11/14/2019  3:33 PM EDT ----- Regarding: Please call patient  Review today's CBC.  Counts have improved.  Does he feel like he needs a transfusion this week?  M ----- Message ----- From: Buel Ream, Lab In [place] Sent: 11/14/2019   1:27 PM EDT To: Lequita Asal, MD

## 2019-11-15 ENCOUNTER — Other Ambulatory Visit: Payer: Self-pay

## 2019-11-15 DIAGNOSIS — D469 Myelodysplastic syndrome, unspecified: Secondary | ICD-10-CM

## 2019-11-15 LAB — PREPARE RBC (CROSSMATCH)

## 2019-11-16 ENCOUNTER — Inpatient Hospital Stay: Payer: Medicare Other

## 2019-11-16 ENCOUNTER — Telehealth: Payer: Self-pay

## 2019-11-16 ENCOUNTER — Other Ambulatory Visit: Payer: Self-pay

## 2019-11-16 DIAGNOSIS — D469 Myelodysplastic syndrome, unspecified: Secondary | ICD-10-CM

## 2019-11-16 DIAGNOSIS — D4621 Refractory anemia with excess of blasts 1: Secondary | ICD-10-CM | POA: Diagnosis not present

## 2019-11-16 MED ORDER — SODIUM CHLORIDE 0.9% IV SOLUTION
250.0000 mL | Freq: Once | INTRAVENOUS | Status: AC
  Administered 2019-11-16: 250 mL via INTRAVENOUS
  Filled 2019-11-16: qty 250

## 2019-11-16 MED ORDER — DIPHENHYDRAMINE HCL 25 MG PO CAPS: 25.0000 mg | ORAL_CAPSULE | Freq: Once | ORAL | Status: DC

## 2019-11-16 MED ORDER — ACETAMINOPHEN 325 MG PO TABS: 650.0000 mg | ORAL_TABLET | Freq: Once | ORAL | Status: DC

## 2019-11-16 NOTE — Telephone Encounter (Signed)
Called spoke with patient wife discussed with her biopsy results, pt need to schedule a sugery appt with Dr Raliegh Ip, pt wife will call back to set up an appt for surgery, pt has other appts she has to schedule around.

## 2019-11-16 NOTE — Patient Instructions (Signed)

## 2019-11-16 NOTE — Progress Notes (Signed)
Patient feeling weak today and slightly unsteady on his feet.  I helped guide him down the hallway.  He says that he has no energy.  He is being treated with a blood transfusion during today's visit.  His wife has asked if she can come into the exam room to be able to understand what is going on.  I have also offered to have one of the clinical staff to sit in on the appointment so they can explain.  Clinic team aware.

## 2019-11-17 LAB — BPAM RBC
Blood Product Expiration Date: 202103232359
ISSUE DATE / TIME: 202103181119
Unit Type and Rh: 9500

## 2019-11-17 LAB — TYPE AND SCREEN
ABO/RH(D): B POS
Antibody Screen: NEGATIVE
Unit division: 0

## 2019-11-19 NOTE — Progress Notes (Signed)
[facility]  [address] [place], [place] [address] Phone: [phone]  Fax: [phone]   Clinic Day:  11/21/2019  Referring physician: Tracie Harrier, MD  Chief Complaint: Paul Pham. is a 86 y.o. male with RAEB-1 with persistent anemia and leukopenia who is seen for reassessment s/p cycle #20 Vidaza.  HPI: The patient was last seen in the hematology clinic on 10/23/2019. At that time, he was doing well. Exam was stable. Hematocrit 28.4, hemoglobin 8.5, platelets 208,000, WBC 3,200 (ANC 1,500). Sodium 132. Calcium 8.5. Patient began cycle #20 Vidaza.     He continued Aransep every 2 weeks (last 11/14/2019).  He received PRBCs on 11/03/2019 and 11/16/2019. Patient reported having no energy and feeling weak and slightly unsteady on his feet on 11/16/2019.  Patient was seen by Dr. Sarina Ser in dermatology on 11/13/2019. Patient reported spots on scalp and left ear. He was treated with cryotherapy for his actinic keratosis. Patient underwent a skin (left ear) and biopsy.  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   Labs followed: 10/30/2019: Hematocrit 25.3, hemoglobin 7.5, platelets 166,000, WBC 2,800 (ANC 1,400). 11/02/2019: Hematocrit 22.4, hemoglobin 6.7, platelets 138,000, WBC 1,900 (ANC 600).  11/14/2019: Hematocrit 23.8, hemoglobin 7.3, platelets 168,000, WBC 2,800 (ANC 1,400).    During the interim, he has felt "good". His energy is about 75%. His constipation is well managed with Miralax. He states he is to go back into the dermatologist's office to have more of the area removed from left ear lobe.  He will scheduled to have surgery tomorrow.  His reports hitting his right knuckle with a hand grinder x 2 weeks ago. He was putting peroxide and polysporin on the area.  The right index finger knuckle has serous drainage. His knuckle has some redness. He denies any fevers,  sweats or chills.  He has previously been seen at [facility].   Patient agreed to a repeat bone marrow if performed by Dr Vernard Gambles.  His wife would like for someone to be with him after is procedure. She is concerned he may not understand direction given to him by doctors.    Past Medical History:  Diagnosis Date  . Anemia   . Arthritis   . BPH (benign prostatic hypertrophy)   . Cancer (No Name)   . Complication of anesthesia    nausea  . Diabetes type 2, controlled ([place])   . HOH (hard of hearing)    r and L ears-70% loss per pt  . Hyperlipidemia   . Hypertension   . Myelodysplasia (myelodysplastic syndrome) ([place]) 2019    Past Surgical History:  Procedure Laterality Date  . APPENDECTOMY    . COLONOSCOPY  09/21/08   1 polyp found, tubular adenoma  . HAND SURGERY    . KNEE SURGERY Left     Family History  Problem Relation Age of Onset  . Aneurysm Mother   . Heart disease Father   . Cancer Brother        skin  . Heart disease Brother   . Heart disease Brother   . Cancer Sister        skin  . Aneurysm Brother   . Heart disease Brother   . Heart disease Sister   . Heart disease Sister   . COPD Sister   . Diabetes Sister     Social History:  reports that he has never smoked. He has never used smokeless tobacco. He reports  that he does not drink alcohol or use drugs. 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 Patient denies known exposures to radiation on toxins. 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 He had his 68-year marriage anniversary on 08/23/2018.He is walking 1-59mles per day.[place] The patient is accompanied by his wife today.  Allergies: No Known Allergies  Current Medications: Current Outpatient Medications  Medication Sig Dispense Refill  . aspirin 81 MG chewable tablet Chew 81 mg by mouth daily.    . Cranberry (THERACRAN PO) Take 1 tablet by mouth daily.     . cyanocobalamin 1000 MCG  tablet Take 1,000 mcg by mouth daily.    . finasteride (PROSCAR) 5 MG tablet Take 5 mg by mouth daily.    .Marland Kitchenglimepiride (AMARYL) 1 MG tablet Take 1 tablet by mouth daily after breakfast.    . glucose blood test strip FreeStyle Lite Strips    . LANCETS ULTRA FINE MISC Lancets,Ultra Thin 26 gauge    . lisinopril (PRINIVIL,ZESTRIL) 10 MG tablet Take 10 mg by mouth daily.    .Marland Kitchenloperamide (IMODIUM) 2 MG capsule Take 1 capsule (2 mg total) by mouth See admin instructions. With onset of loose stool, take 439mfollowed by 52m58mvery 2 hours until loose bowel movement stopped. Paul: 16 mg/day 30 capsule 0  . meclizine (ANTIVERT) 25 MG tablet Take 25 mg by mouth 2 (two) times daily as needed.     . metformin (FORTAMET) 1000 MG (OSM) 24 hr tablet Take 1,000 mg by mouth 2 (two) times daily with a meal.    . Multiple Vitamins-Minerals (CENTRUM SILVER PO) Take by mouth.    . Omega-3 Fatty Acids (FISH OIL PO) Take 1 tablet by mouth daily.     . ondansetron (ZOFRAN) 4 MG tablet Take 1 tablet (4 mg total) by mouth every 6 (six) hours as needed for nausea or vomiting. 30 tablet 2  . Polyethylene Glycol 3350 (MIRALAX PO) Take by mouth as needed.     . SClarnce Flocklmetto-Phytosterols (PROSTATE SR PO) Take 1 tablet by mouth daily.     . simvastatin (ZOCOR) 40 MG tablet Take 40 mg by mouth daily.     No current facility-administered medications for this visit.   Facility-Administered Medications Ordered in Other Visits  Medication Dose Route Frequency Provider Last Rate Last Admin  . acetaminophen (TYLENOL) tablet 650 mg  650 mg Oral Once CorNolon Stalls MD      . diphenhydrAMINE (BENADRYL) capsule 25 mg  25 mg Oral Once CorLequita AsalD        Review of Systems  Constitutional: Negative for chills, diaphoresis, fever, malaise/fatigue and weight loss (up 1 pound).       Feels "good". Energy level at 75%.  HENT: Positive for hearing loss. Negative for congestion, ear pain, nosebleeds, sinus pain and sore  throat.   Eyes: Negative.  Negative for blurred vision, double vision, photophobia and pain.  Respiratory: Negative.  Negative for cough, sputum production, shortness of breath and wheezing.   Cardiovascular: Negative for chest pain, palpitations, orthopnea, leg swelling and PND.  Gastrointestinal: Positive for constipation (well managed with Miralax). Negative for abdominal pain, blood in stool, diarrhea, melena, nausea and vomiting.  Genitourinary: Negative.  Negative for dysuria, frequency, hematuria and urgency.  Musculoskeletal: Negative for back pain, falls, joint pain, myalgias and neck pain.  Skin: Negative.  Negative for itching and rash.  Right index MCP redness with serous drainage x 2 weeks s/p trauma.  Neurological: Negative.  Negative for dizziness, tingling, sensory change, speech change, focal weakness, weakness and headaches.  Endo/Heme/Allergies: Does not bruise/bleed easily.       Diabetes.  Psychiatric/Behavioral: Negative.  Negative for depression and memory loss. The patient is not nervous/anxious and does not have insomnia.   All other systems reviewed and are negative.  Performance status (ECOG): 1  Vitals Blood pressure (!) 147/87, pulse 73, temperature (!) 97.1 F (36.2 C), temperature source Tympanic, resp. rate 16, weight 180 lb 3.6 oz (81.8 kg), SpO2 100 %.   Physical Exam  Constitutional: He is oriented to person, place, and time. He appears well-developed and well-nourished. No distress.  HENT:  Head: Normocephalic and atraumatic.  Mouth/Throat: Oropharynx is clear and moist. No oropharyngeal exudate.  Sparse short gray hair.  Near alopecia/male pattern baldness.  Mask.  Hearing aid.    Eyes: Pupils are equal, round, and reactive to light. Conjunctivae and EOM are normal. No scleral icterus.  Gold rimmed glasses.  Blue eyes.  Neck: No JVD present.  Cardiovascular: Normal rate, regular rhythm and normal heart sounds. Exam reveals no gallop.  No murmur  heard. Pulmonary/Chest: Effort normal and breath sounds normal. No respiratory distress. He has no wheezes. He has no rales.  Abdominal: Soft. Bowel sounds are normal. He exhibits no distension and no mass. There is no abdominal tenderness. There is no rebound and no guarding.  Musculoskeletal:        General: No tenderness or edema. Normal range of motion.     Cervical back: Normal range of motion.  Lymphadenopathy:       Head (right side): No preauricular, no posterior auricular and no occipital adenopathy present.       Head (left side): No preauricular, no posterior auricular and no occipital adenopathy present.    He has no cervical adenopathy.    He has no axillary adenopathy.       Right: No inguinal and no supraclavicular adenopathy present.       Left: No inguinal and no supraclavicular adenopathy present.  Neurological: He is alert and oriented to person, place, and time. He has normal reflexes.  Skin: Skin is warm and dry. No rash noted. He is not diaphoretic. No erythema. No pallor.  Right index MCP with thickened edematous tendon.  Psychiatric: He has a normal mood and affect. His behavior is normal. Judgment and thought content normal.  Nursing note and vitals reviewed.     Right hand      No visits with results within 3 Day(s) from this visit.  Latest known visit with results is:  Orders Only on 11/15/2019  Component Date Value Ref Range Status  . ABO/RH(D) 11/14/2019 B POS   Final  . Antibody Screen 11/14/2019 NEG   Final  . Sample Expiration 11/14/2019 11/17/2019,2359   Final  . Unit Number 11/14/2019 [id number]   Final  . Blood Component Type 11/14/2019 RBC, LR IRR   Final  . Unit division 11/14/2019 00   Final  . Status of Unit 11/14/2019 ISSUED,FINAL   Final  . Transfusion Status 11/14/2019 OK TO TRANSFUSE   Final  . Crossmatch Result 11/14/2019    Final                   Value:Compatible Performed at [facility], [address]  [place], [place] [address]   . Order Confirmation 11/14/2019  Final                   Value:ORDER PROCESSED BY BLOOD BANK Performed at [facility], [address] [place], [place] [address]   . ISSUE DATE / TIME 11/14/2019 [id number]   Final  . Blood Product Unit Number 11/14/2019 [id number]   Final  . PRODUCT CODE 11/14/2019 [id number]   Final  . Unit Type and Rh 11/14/2019 9500   Final  . Blood Product Expiration Date 11/14/2019 [id number]   Final    Assessment:  Paul Pham. is a 86 y.o. 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 Bone marrowon 09/22/2017 revealed a hypercellular for age with dyspoietic changes variably involving myeloid cell lines, but with main involvement of the granulocytic/monocytic cell line. This was associated with bone marrow monocytosis and borderline number to slight increase in blastic cells. The overall changes favor a primary myeloid neoplasm, particularly a myelodysplastic syndrome especially refractory anemia with excess blasts (RAEB-1) or possibly refractory cytopenia with multilineage dysplasia. Consideration was also given to an evolving myelodysplastic/myeloproliferative neoplasm such as chronic myelomonocytic leukemia but the lack of absolute peripheral monocytosis precluded such a diagnosis at this time. Flow cytometrywas negative. Cytogeneticswere normal (46, XY). Foundation one was positive for ASXL1 I325QD*82, EZH2 Splice site 641-5A>X, RUNX1 [id number]. IPSS-R4.5, intermediate group.  Anemia work-up on 08/17/2017: Vitamin B12 (658), folate (22.0), and TSH (2.688). Retic was 2.6%. Haptoglobin was 116 on 08/19/2017. Epo levelwas 104.8 on 10/05/2017.  Ferritin has been followed: 106 on 08/17/2017, 131 on 11/04/2017, 144 on 05/30/2018, 394 on 09/01/2018, 441 on 11/28/2018, 394 on 01/02/2019, 480 on 02/06/2019, 392 on 03/13/2019, 527 on 04/17/2019, 533 on 05/29/2019, 476 on 06/05/2019, 517 on 07/17/2019, 685 on 08/28/2019, and 724 on 10/16/2019. Iron  saturation was 34% on 08/17/2017 and 89% on 09/01/2018.  Bone marrowon 09/22/2017 revealed a hypercellular for age with dyspoietic changes variably involving myeloid cell lines, but with main involvement of the granulocytic/monocytic cell line. This was associated with bone marrow monocytosis and borderline number to slight increase in blasts (5%). The overall changes favor a primary myeloid neoplasm, particularly a myelodysplastic syndrome especially refractory anemia with excess blasts (RAEB-1) or possibly refractory cytopenia with multilineage dysplasia. Consideration was also given to an evolving myelodysplastic/myeloproliferative neoplasm such as chronic myelomonocytic leukemia but the lack of absolute peripheral monocytosis precluded such a diagnosis at this time. Flow cytometrywas negative. Cytogeneticswere normal (46, XY). Foundation One was positive for ASXL1 E940HW*80, EZH2 Splice site 881-1S>R, RUNX1 [id number].  He has received 20cycles ofVidaza(11/04/2017 - 08/22/2018; 10/24/2018-10/23/2019). He has received Aranesp(initially 150 mcg every 2 weeks post chemotherapy then 300 mcg every 1-2 weeks after cycle #3; last 09/19/2018). He last received Aranesp on03/16/2021. He has received 1-2 units of PRBCswith each cycle (last03/18/2021).He has received27units of PRBCs since 08/17/2017.  Bone marrowon 08/12/2018 revealed a hypercellular marrow with dyspoietic changes. There was a borderline number of blasts (5%) seen by morphology. The findings were similar to previous biopsy although the cellularity is slightly higher in the current material.consistent with previously known myelodysplastic syndrome. Flow cytometry was negative. Cytogenetics were normal (46, XY).  HereceivedbothPfizervaccinesfor COVID-19(firston01/01/2020).  Symptomatically, he feels good.  Energy level is 75%.  He denies any fevers, bruising or bleeding.  He has had trauma to his 2nd right  MCP.  Plan: 1.   Review labs from 11/14/2019. 2. Refractory anemia with excess blasts (RAEB-1) Clinically,he feels good. Helast received Vidaza 4 weeks ago (10/23/2019). Treatmentgoalhas been to decrease transfusion dependence and prevent acceleration of disease (blasts). Counts  have been slow to recover (last cycle 7 weeks) with increase in transfusion support despite Aranesp. Last every 2 week Aranesp on 11/14/2019.  Postpone cycle#21 Vidaza. Discuss plans for bone marrow aspirate and biopsy.  Patient requests Dr Vernard Gambles to perform. 3. Iron overload Ferritin724 on 10/16/2019. He has received27units of PRBCs to date (08/17/2017 -11/16/2019). Previously we discussed initiation of Jadenu as ferritin approaches 1000. Discuss postponing of Jadenu secondary to persistent leukopenia delaying treatment. 4.   Right second MCP infection  Contact [facility]- done.  Appointment made today.  Review fever and neutropenia precautions. 5.  Cancel chemotherapy plans beginning 12/04/2019. 6.   Continue labs as previously scheduled. 7.   Bone marrow biopsy with Dr Gaylyn Lambert. 8.   RTC 10 days after bone marrow for MD assessment and review of bone marrow results.  I discussed the assessment and treatment plan with the patient.  The patient was provided an opportunity to ask questions and all were answered.  The patient agreed with the plan and demonstrated an understanding of the instructions.  The patient was advised to call back if the symptoms worsen or if the condition fails to improve as anticipated.   Lequita Asal, MD, PhD    11/21/2019, 8:48 AM  I, Selena Batten, am acting as scribe for [employer]. Mike Gip, MD, PhD.  I, Cace Osorto C. Mike Gip, MD, have reviewed the above documentation for accuracy and completeness, and I agree with the  above.

## 2019-11-21 ENCOUNTER — Telehealth: Payer: Self-pay

## 2019-11-21 ENCOUNTER — Other Ambulatory Visit: Payer: Self-pay

## 2019-11-21 ENCOUNTER — Encounter: Payer: Self-pay | Admitting: Hematology and Oncology

## 2019-11-21 ENCOUNTER — Inpatient Hospital Stay (HOSPITAL_BASED_OUTPATIENT_CLINIC_OR_DEPARTMENT_OTHER): Payer: Medicare Other | Admitting: Hematology and Oncology

## 2019-11-21 VITALS — BP 147/87 | HR 73 | Temp 97.1°F | Resp 16 | Wt 180.2 lb

## 2019-11-21 DIAGNOSIS — D72819 Decreased white blood cell count, unspecified: Secondary | ICD-10-CM | POA: Diagnosis not present

## 2019-11-21 DIAGNOSIS — Z7189 Other specified counseling: Secondary | ICD-10-CM

## 2019-11-21 DIAGNOSIS — D469 Myelodysplastic syndrome, unspecified: Secondary | ICD-10-CM

## 2019-11-21 NOTE — Progress Notes (Signed)
Patient here for follow up. Reports he had skin biopsy last week and was informed he has skin cancer. States he is to go back into office to have more of the area removed from left ear lobe. Dr. Nehemiah Massed is the dermatologist that did procedure. Denies any other concerns.

## 2019-11-21 NOTE — Telephone Encounter (Signed)
Informed Inez Catalina, Patient's wife, that patient is to see hand specialist at [facility], Dr. Peggye Ley, today at 1 or 130 and to contact their office to confirm time. Wife verbalizes understanding and denies any further questions or concerns.

## 2019-11-21 NOTE — Telephone Encounter (Signed)
Contacted patient's wife, betty, and informed her patient is scheduled to have Bone Marrow Biopsy on 11/28/2019 at 0730. Informed her a nurse from their dept. Would be reaching out with further instructions. Wife verbalizes understanding and denies any further questions or concerns.

## 2019-11-22 ENCOUNTER — Ambulatory Visit: Payer: Medicare Other | Admitting: Dermatology

## 2019-11-27 ENCOUNTER — Inpatient Hospital Stay: Payer: Medicare Other

## 2019-11-27 ENCOUNTER — Other Ambulatory Visit: Payer: Self-pay

## 2019-11-27 ENCOUNTER — Ambulatory Visit
Admission: RE | Admit: 2019-11-27 | Discharge: 2019-11-27 | Disposition: A | Discharge status: Home / Self Care | Payer: Medicare Other | Source: Ambulatory Visit | Attending: Hematology and Oncology | Admitting: Hematology and Oncology

## 2019-11-27 DIAGNOSIS — Z7984 Long term (current) use of oral hypoglycemic drugs: Secondary | ICD-10-CM | POA: Insufficient documentation

## 2019-11-27 DIAGNOSIS — D469 Myelodysplastic syndrome, unspecified: Secondary | ICD-10-CM | POA: Diagnosis present

## 2019-11-27 DIAGNOSIS — Z79899 Other long term (current) drug therapy: Secondary | ICD-10-CM | POA: Diagnosis not present

## 2019-11-27 DIAGNOSIS — Z8249 Family history of ischemic heart disease and other diseases of the circulatory system: Secondary | ICD-10-CM | POA: Insufficient documentation

## 2019-11-27 DIAGNOSIS — E119 Type 2 diabetes mellitus without complications: Secondary | ICD-10-CM | POA: Insufficient documentation

## 2019-11-27 DIAGNOSIS — M1990 Unspecified osteoarthritis, unspecified site: Secondary | ICD-10-CM | POA: Insufficient documentation

## 2019-11-27 DIAGNOSIS — Z833 Family history of diabetes mellitus: Secondary | ICD-10-CM | POA: Insufficient documentation

## 2019-11-27 DIAGNOSIS — E785 Hyperlipidemia, unspecified: Secondary | ICD-10-CM | POA: Diagnosis not present

## 2019-11-27 DIAGNOSIS — N400 Enlarged prostate without lower urinary tract symptoms: Secondary | ICD-10-CM | POA: Insufficient documentation

## 2019-11-27 DIAGNOSIS — H9190 Unspecified hearing loss, unspecified ear: Secondary | ICD-10-CM | POA: Diagnosis not present

## 2019-11-27 DIAGNOSIS — D509 Iron deficiency anemia, unspecified: Secondary | ICD-10-CM | POA: Diagnosis not present

## 2019-11-27 DIAGNOSIS — I10 Essential (primary) hypertension: Secondary | ICD-10-CM | POA: Diagnosis not present

## 2019-11-27 DIAGNOSIS — Z7982 Long term (current) use of aspirin: Secondary | ICD-10-CM | POA: Insufficient documentation

## 2019-11-27 DIAGNOSIS — Z825 Family history of asthma and other chronic lower respiratory diseases: Secondary | ICD-10-CM | POA: Insufficient documentation

## 2019-11-27 LAB — CBC WITH DIFFERENTIAL/PLATELET
Abs Immature Granulocytes: 0.02 10*3/uL (ref 0.00–0.07)
Basophils Absolute: 0 10*3/uL (ref 0.0–0.1)
Basophils Relative: 0 %
Eosinophils Absolute: 0 10*3/uL (ref 0.0–0.5)
Eosinophils Relative: 0 %
HCT: 25.8 % — ABNORMAL LOW (ref 39.0–52.0)
Hemoglobin: 7.7 g/dL — ABNORMAL LOW (ref 13.0–17.0)
Immature Granulocytes: 1 %
Lymphocytes Relative: 45 %
Lymphs Abs: 1.1 10*3/uL (ref 0.7–4.0)
MCH: 27 pg (ref 26.0–34.0)
MCHC: 29.8 g/dL — ABNORMAL LOW (ref 30.0–36.0)
MCV: 90.5 fL (ref 80.0–100.0)
Monocytes Absolute: 0.3 10*3/uL (ref 0.1–1.0)
Monocytes Relative: 11 %
Neutro Abs: 1 10*3/uL — ABNORMAL LOW (ref 1.7–7.7)
Neutrophils Relative %: 43 %
Platelets: 227 10*3/uL (ref 150–400)
RBC: 2.85 MIL/uL — ABNORMAL LOW (ref 4.22–5.81)
RDW: 16.6 % — ABNORMAL HIGH (ref 11.5–15.5)
WBC: 2.4 10*3/uL — ABNORMAL LOW (ref 4.0–10.5)
nRBC: 0 % (ref 0.0–0.2)

## 2019-11-27 LAB — GLUCOSE, CAPILLARY
Glucose-Capillary: 214 mg/dL — ABNORMAL HIGH (ref 70–99)
Glucose-Capillary: 209 mg/dL — ABNORMAL HIGH (ref 70–99)

## 2019-11-27 MED ORDER — ONDANSETRON 4 MG PO TBDP
4.0000 mg | ORAL_TABLET | Freq: Once | ORAL | Status: AC
  Filled 2019-11-27: qty 1

## 2019-11-27 MED ORDER — MIDAZOLAM HCL 2 MG/2ML IJ SOLN
INTRAMUSCULAR | Status: AC
  Filled 2019-11-27: qty 2

## 2019-11-27 MED ORDER — MIDAZOLAM HCL 2 MG/2ML IJ SOLN
INTRAMUSCULAR | Status: AC | PRN
  Administered 2019-11-27: 1 mg via INTRAVENOUS

## 2019-11-27 MED ORDER — HEPARIN SOD (PORK) LOCK FLUSH 100 UNIT/ML IV SOLN
INTRAVENOUS | Status: AC
  Filled 2019-11-27: qty 5

## 2019-11-27 MED ORDER — ONDANSETRON 4 MG PO TBDP
ORAL_TABLET | ORAL | Status: AC
  Administered 2019-11-27: 4 mg via ORAL
  Filled 2019-11-27: qty 1

## 2019-11-27 MED ORDER — SODIUM CHLORIDE 0.9 % IV SOLN: INTRAVENOUS | Status: DC

## 2019-11-27 MED ORDER — ONDANSETRON HCL 4 MG PO TABS
4.0000 mg | ORAL_TABLET | Freq: Once | ORAL | Status: DC
  Filled 2019-11-27: qty 1

## 2019-11-27 MED ORDER — FENTANYL CITRATE (PF) 100 MCG/2ML IJ SOLN
INTRAMUSCULAR | Status: AC
  Filled 2019-11-27: qty 2

## 2019-11-27 MED ORDER — FENTANYL CITRATE (PF) 100 MCG/2ML IJ SOLN
INTRAMUSCULAR | Status: AC | PRN
  Administered 2019-11-27 (×2): 50 ug via INTRAVENOUS

## 2019-11-27 NOTE — H&P (Signed)
Chief Complaint: Patient was seen in consultation today for CT-guided bone marrow biopsy at the request of [place] C  Referring Physician(s): Lomas C  Patient Status: [facility] - Out-pt  History of Present Illness: Paul Nyman. is a 86 y.o. male with myelodysplasia and persistent low counts.  Patient has had previous bone marrow biopsies.  Patient has no complaints.  Patient has had the Covid vaccine.  Past Medical History:  Diagnosis Date  . Anemia   . Arthritis   . BPH (benign prostatic hypertrophy)   . Cancer ([place])   . Complication of anesthesia    nausea  . Diabetes type 2, controlled ([place])   . HOH (hard of hearing)    r and L ears-70% loss per pt  . Hyperlipidemia   . Hypertension   . Myelodysplasia (myelodysplastic syndrome) ([place]) 2019    Past Surgical History:  Procedure Laterality Date  . APPENDECTOMY    . COLONOSCOPY  09/21/08   1 polyp found, tubular adenoma  . HAND SURGERY    . KNEE SURGERY Left     Allergies: Patient has no known allergies.  Medications: Prior to Admission medications   Medication Sig Start Date End Date Taking? Authorizing Provider  aspirin 81 MG chewable tablet Chew 81 mg by mouth daily.   Yes [provider]  Cranberry (THERACRAN PO) Take 1 tablet by mouth daily.    Yes [provider]  glimepiride (AMARYL) 1 MG tablet Take 1 tablet by mouth daily after breakfast. 01/24/19 01/24/20 Yes [provider]  glucose blood test strip FreeStyle Lite Strips   Yes [provider]  LANCETS ULTRA FINE MISC Lancets,Ultra Thin 26 gauge   Yes [provider]  lisinopril (PRINIVIL,ZESTRIL) 10 MG tablet Take 10 mg by mouth daily.   Yes [provider]  loperamide (IMODIUM) 2 MG capsule Take 1 capsule (2 mg total) by mouth See admin instructions. With onset of loose stool, take 4m followed by 239mevery 2 hours until loose bowel movement stopped. Maximum: 16 mg/day 01/27/18  Yes  YuEarlie ServerMD  meclizine (ANTIVERT) 25 MG tablet Take 25 mg by mouth 2 (two) times daily as needed.    Yes [provider]  metformin (FORTAMET) 1000 MG (OSM) 24 hr tablet Take 1,000 mg by mouth 2 (two) times daily with a meal.   Yes [provider]  Multiple Vitamins-Minerals (CENTRUM SILVER PO) Take by mouth.   Yes [provider]  Omega-3 Fatty Acids (FISH OIL PO) Take 1 tablet by mouth daily.    Yes [provider]  ondansetron (ZOFRAN) 4 MG tablet Take 1 tablet (4 mg total) by mouth every 6 (six) hours as needed for nausea or vomiting. 07/18/19  Yes CoLequita AsalMD  Polyethylene Glycol 3350 (MIRALAX PO) Take by mouth as needed.    Yes [provider]  Saw Palmetto-Phytosterols (PROSTATE SR PO) Take 1 tablet by mouth daily.    Yes [provider]  simvastatin (ZOCOR) 40 MG tablet Take 40 mg by mouth daily.   Yes [provider]  cyanocobalamin 1000 MCG tablet Take 1,000 mcg by mouth daily.    [provider]  finasteride (PROSCAR) 5 MG tablet Take 5 mg by mouth daily.    [provider]     Family History  Problem Relation Age of Onset  . Aneurysm Mother   . Heart disease Father   . Cancer Brother        skin  .  Heart disease Brother   . Heart disease Brother   . Cancer Sister        skin  . Aneurysm Brother   . Heart disease Brother   . Heart disease Sister   . Heart disease Sister   . COPD Sister   . Diabetes Sister     Social History   Socioeconomic History  . Marital status: Married    Spouse name: Not on file  . Number of children: Not on file  . Years of education: Not on file  . Highest education level: Not on file  Occupational History  . Not on file  Tobacco Use  . Smoking status: Never Smoker  . Smokeless tobacco: Never Used  Substance and Sexual Activity  . Alcohol use: No    Alcohol/week: 0.0 standard drinks  . Drug use: No  . Sexual activity: Not on file  Other  Topics Concern  . Not on file  Social History Narrative   Lives in private residence with wife   Social Determinants of Health   Financial Resource Strain:   . Difficulty of Paying Living Expenses:   Food Insecurity:   . Worried About Charity fundraiser in the Last Year:   . Arboriculturist in the Last Year:   Transportation Needs:   . Film/video editor (Medical):   Marland Kitchen Lack of Transportation (Non-Medical):   Physical Activity:   . Days of Exercise per Week:   . Minutes of Exercise per Session:   Stress:   . Feeling of Stress :   Social Connections:   . Frequency of Communication with Friends and Family:   . Frequency of Social Gatherings with Friends and Family:   . Attends Religious Services:   . Active Member of Clubs or Organizations:   . Attends Archivist Meetings:   Marland Kitchen Marital Status:      Review of Systems  Constitutional: Negative.   Respiratory: Negative.   Cardiovascular: Negative.   Gastrointestinal: Negative.   Genitourinary: Negative.     Vital Signs: BP (!) 147/48   Pulse 77   Temp 97.8 F (36.6 C) (Oral)   Resp 13   Ht '6\' 1"'  (1.854 m)   Wt 79.4 kg   SpO2 97%   BMI 23.09 kg/m   Physical Exam Vitals reviewed.  Constitutional:      Appearance: He is not ill-appearing.  Cardiovascular:     Rate and Rhythm: Normal rate and regular rhythm.     Heart sounds: Normal heart sounds.  Pulmonary:     Effort: Pulmonary effort is normal.     Breath sounds: Normal breath sounds.  Abdominal:     General: Abdomen is flat. Bowel sounds are normal.     Palpations: Abdomen is soft.  Neurological:     Mental Status: He is alert.     Imaging: No results found.  Labs:  CBC: Recent Labs    10/30/19 1038 11/02/19 0804 11/14/19 1313 11/27/19 0823  WBC 2.8* 1.9* 2.8* 2.4*  HGB 7.5* 6.7* 7.3* 7.7*  HCT 25.3* 22.4* 23.8* 25.8*  PLT 166 138* 168 227    COAGS: No results for input(s): INR, APTT in the last 8760 hours.  BMP: Recent  Labs    08/28/19 1303 09/04/19 0925 10/16/19 1023 10/23/19 1059  NA 133* 136 134* 132*  K 4.5 4.4 4.6 4.4  CL 103 103 102 100  CO2 '23 26 26 24  ' GLUCOSE 186* 182* 154* 186*  BUN 26* 21 24* 27*  CALCIUM 8.7* 8.6* 9.0 8.5*  CREATININE 1.00 0.91 0.93 0.89  GFRNONAA >60 >60 >60 >60  GFRAA >60 >60 >60 >60    LIVER FUNCTION TESTS: Recent Labs    08/28/19 1303 09/04/19 0925 10/16/19 1023 10/23/19 1059  BILITOT 0.7 0.8 0.7 0.8  AST '19 18 19 20  ' ALT '16 15 17 16  ' ALKPHOS 28* 28* 28* 27*  PROT 7.1 7.1 7.1 7.1  ALBUMIN 4.2 4.2 4.2 4.1    TUMOR MARKERS: No results for input(s): AFPTM, CEA, CA199, CHROMGRNA in the last 8760 hours.  Assessment and Plan:  86 year old with history of myelodysplastic syndrome and request for a repeat bone marrow biopsy.  Patient is familiar with the procedure and has no question. Risks and benefits of bone marrow biopsy was discussed with the patient and/or patient's family including, but not limited to bleeding, infection, damage to adjacent structures or low yield requiring additional tests.  All of the questions were answered and there is agreement to proceed.  Consent signed and in chart.    Thank you for this interesting consult.  I greatly enjoyed meeting Paul Pham. and look forward to participating in their care.  A copy of this report was sent to the requesting provider on this date.  Electronically Signed: Burman Riis, MD 11/27/2019, 9:30 AM   I spent a total of    10 Minutes in face to face in clinical consultation, greater than 50% of which was counseling/coordinating care for CT-guided bone marrow biopsy

## 2019-11-27 NOTE — Procedures (Signed)
Interventional Radiology Procedure:   Indications: MDS with persistent low counts.   Procedure: CT guided bone marrow biopsy  Findings: 2 aspirates and 1 core from right ilium  Complications: None     EBL: Minimal, less than 10 ml  Plan: Discharge to home in one hour.   Kayna Suppa R. Anselm Pancoast, MD  Pager: [phone]

## 2019-11-27 NOTE — Progress Notes (Signed)
Patient complaining of feeling a little lightheaded and experiencing elevated blood pressure. Dr. Anselm Pancoast notified and at bedside. Patient's wife stated that Paul Pham sometimes experiences dizziness and vertigo and has prescribed medications to treat. Patient vomited and Dr. Anselm Pancoast was contacted. 4mg  of Zofran administered. Patient stated he feels better.

## 2019-11-27 NOTE — Discharge Instructions (Signed)
Bone Marrow Aspiration and Bone Marrow Biopsy, Adult, Care After This sheet gives you information about how to care for yourself after your procedure. Your health care provider may also give you more specific instructions. If you have problems or questions, contact your health care provider. What can I expect after the procedure? After the procedure, it is common to have:  Mild pain and tenderness.  Swelling.  Bruising. Follow these instructions at home: Puncture site care   Follow instructions from your health care provider about how to take care of the puncture site. Make sure you: ? Wash your hands with soap and water before and after you change your bandage (dressing). If soap and water are not available, use hand sanitizer. ? Change your dressing as told by your health care provider.  Check your puncture site every day for signs of infection. Check for: ? More redness, swelling, or pain. ? Fluid or blood. ? Warmth. ? Pus or a bad smell. Activity  Return to your normal activities as told by your health care provider. Ask your health care provider what activities are safe for you.  Do not lift anything that is heavier than 10 lb (4.5 kg), or the limit that you are told, until your health care provider says that it is safe.  Do not drive for 24 hours if you were given a sedative during your procedure. General instructions   Take over-the-counter and prescription medicines only as told by your health care provider.  Do not take baths, swim, or use a hot tub until your health care provider approves. Ask your health care provider if you may take showers. You may only be allowed to take sponge baths.  If directed, put ice on the affected area. To do this: ? Put ice in a plastic bag. ? Place a towel between your skin and the bag. ? Leave the ice on for 20 minutes, 2-3 times a day.  Keep all follow-up visits as told by your health care provider. This is important. Contact a  health care provider if:  Your pain is not controlled with medicine.  You have a fever.  You have more redness, swelling, or pain around the puncture site.  You have fluid or blood coming from the puncture site.  Your puncture site feels warm to the touch.  You have pus or a bad smell coming from the puncture site. Summary  After the procedure, it is common to have mild pain, tenderness, swelling, and bruising.  Follow instructions from your health care provider about how to take care of the puncture site and what activities are safe for you.  Take over-the-counter and prescription medicines only as told by your health care provider.  Contact a health care provider if you have any signs of infection, such as fluid or blood coming from the puncture site. This information is not intended to replace advice given to you by your health care provider. Make sure you discuss any questions you have with your health care provider. Document Revised: 01/03/2019 Document Reviewed: 01/03/2019 Elsevier Patient Education  [place]. Moderate Conscious Sedation, Adult, Care After These instructions provide you with information about caring for yourself after your procedure. Your health care provider may also give you more specific instructions. Your treatment has been planned according to current medical practices, but problems sometimes occur. Call your health care provider if you have any problems or questions after your procedure. What can I expect after the procedure? After your procedure,  it is common:  To feel sleepy for several hours.  To feel clumsy and have poor balance for several hours.  To have poor judgment for several hours.  To vomit if you eat too soon. Follow these instructions at home: For at least 24 hours after the procedure:   Do not: ? Participate in activities where you could fall or become injured. ? Drive. ? Use heavy machinery. ? Drink alcohol. ? Take  sleeping pills or medicines that cause drowsiness. ? Make important decisions or sign legal documents. ? Take care of children on your own.  Rest. Eating and drinking  Follow the diet recommended by your health care provider.  If you vomit: ? Drink water, juice, or soup when you can drink without vomiting. ? Make sure you have little or no nausea before eating solid foods. General instructions  Have a responsible adult stay with you until you are awake and alert.  Take over-the-counter and prescription medicines only as told by your health care provider.  If you smoke, do not smoke without supervision.  Keep all follow-up visits as told by your health care provider. This is important. Contact a health care provider if:  You keep feeling nauseous or you keep vomiting.  You feel light-headed.  You develop a rash.  You have a fever. Get help right away if:  You have trouble breathing. This information is not intended to replace advice given to you by your health care provider. Make sure you discuss any questions you have with your health care provider. Document Revised: 07/30/2017 Document Reviewed: 12/07/2015 Elsevier Patient Education  2020 [employer].

## 2019-11-27 NOTE — Progress Notes (Signed)
Patient clinically stable post BMB per DR. Henn, tolerated well. Vitals stable throughout entire procedure. bandade dressing dry and intact to sacral area. Denies complaints  At this time. Received Versed 1mg  along with Fentanyl 175mcg IV for procedure.

## 2019-11-28 ENCOUNTER — Other Ambulatory Visit: Payer: Self-pay | Admitting: Hematology and Oncology

## 2019-11-28 ENCOUNTER — Other Ambulatory Visit: Payer: Self-pay

## 2019-11-28 ENCOUNTER — Ambulatory Visit: Admission: RE | Admit: 2019-11-28 | Payer: Medicare Other | Source: Ambulatory Visit

## 2019-11-28 ENCOUNTER — Inpatient Hospital Stay: Payer: Medicare Other

## 2019-11-28 VITALS — BP 153/65 | HR 74 | Resp 16

## 2019-11-28 DIAGNOSIS — D469 Myelodysplastic syndrome, unspecified: Secondary | ICD-10-CM

## 2019-11-28 DIAGNOSIS — D4621 Refractory anemia with excess of blasts 1: Secondary | ICD-10-CM | POA: Diagnosis not present

## 2019-11-28 DIAGNOSIS — Z7189 Other specified counseling: Secondary | ICD-10-CM

## 2019-11-28 LAB — CBC WITH DIFFERENTIAL/PLATELET
Abs Immature Granulocytes: 0.02 10*3/uL (ref 0.00–0.07)
Basophils Absolute: 0 10*3/uL (ref 0.0–0.1)
Basophils Relative: 0 %
Eosinophils Absolute: 0 10*3/uL (ref 0.0–0.5)
Eosinophils Relative: 0 %
HCT: 25.9 % — ABNORMAL LOW (ref 39.0–52.0)
Hemoglobin: 7.7 g/dL — ABNORMAL LOW (ref 13.0–17.0)
Immature Granulocytes: 1 %
Lymphocytes Relative: 45 %
Lymphs Abs: 1.7 10*3/uL (ref 0.7–4.0)
MCH: 27 pg (ref 26.0–34.0)
MCHC: 29.7 g/dL — ABNORMAL LOW (ref 30.0–36.0)
MCV: 90.9 fL (ref 80.0–100.0)
Monocytes Absolute: 0.3 10*3/uL (ref 0.1–1.0)
Monocytes Relative: 9 %
Neutro Abs: 1.6 10*3/uL — ABNORMAL LOW (ref 1.7–7.7)
Neutrophils Relative %: 45 %
Platelets: 258 10*3/uL (ref 150–400)
RBC: 2.85 MIL/uL — ABNORMAL LOW (ref 4.22–5.81)
RDW: 16.6 % — ABNORMAL HIGH (ref 11.5–15.5)
WBC: 3.6 10*3/uL — ABNORMAL LOW (ref 4.0–10.5)
nRBC: 0 % (ref 0.0–0.2)

## 2019-11-28 LAB — SAMPLE TO BLOOD BANK

## 2019-11-28 LAB — PREPARE RBC (CROSSMATCH)

## 2019-11-28 MED ORDER — DARBEPOETIN ALFA 300 MCG/0.6ML IJ SOSY: 300.0000 ug | PREFILLED_SYRINGE | Freq: Once | INTRAMUSCULAR | Status: DC

## 2019-11-28 NOTE — Progress Notes (Signed)
Patient arrived today for labs and aranesp injetion.  Patient reported that he threw up twice after his bone marrow biopsy.  MD notified.  Patient's hemoglobin was 7.7 today and he has elected to come back for a unit of PRBCs.  Patient is to be scheduled for transusion on Thursday.  Patient verbalized understanding.

## 2019-11-29 LAB — SURGICAL PATHOLOGY

## 2019-11-30 ENCOUNTER — Other Ambulatory Visit: Payer: Self-pay

## 2019-11-30 ENCOUNTER — Inpatient Hospital Stay: Payer: Medicare Other | Attending: Hematology and Oncology

## 2019-11-30 DIAGNOSIS — D4621 Refractory anemia with excess of blasts 1: Secondary | ICD-10-CM | POA: Diagnosis not present

## 2019-11-30 DIAGNOSIS — D469 Myelodysplastic syndrome, unspecified: Secondary | ICD-10-CM

## 2019-11-30 MED ORDER — SODIUM CHLORIDE 0.9% IV SOLUTION
250.0000 mL | Freq: Once | INTRAVENOUS | Status: AC
  Administered 2019-11-30: 250 mL via INTRAVENOUS
  Filled 2019-11-30: qty 250

## 2019-11-30 MED ORDER — DIPHENHYDRAMINE HCL 25 MG PO CAPS: 25.0000 mg | ORAL_CAPSULE | Freq: Once | ORAL | Status: DC

## 2019-11-30 MED ORDER — ACETAMINOPHEN 325 MG PO TABS: 650.0000 mg | ORAL_TABLET | Freq: Once | ORAL | Status: DC

## 2019-11-30 NOTE — Patient Instructions (Signed)

## 2019-12-01 LAB — BPAM RBC
Blood Product Expiration Date: 202104022359
ISSUE DATE / TIME: 202104011023
Unit Type and Rh: 7300

## 2019-12-01 LAB — TYPE AND SCREEN
ABO/RH(D): B POS
Antibody Screen: NEGATIVE
Unit division: 0

## 2019-12-04 ENCOUNTER — Ambulatory Visit: Payer: TRICARE For Life (TFL)

## 2019-12-04 ENCOUNTER — Ambulatory Visit: Payer: TRICARE For Life (TFL) | Admitting: Hematology and Oncology

## 2019-12-04 ENCOUNTER — Other Ambulatory Visit: Payer: TRICARE For Life (TFL)

## 2019-12-05 ENCOUNTER — Encounter (HOSPITAL_COMMUNITY): Payer: Self-pay | Admitting: Hematology and Oncology

## 2019-12-05 ENCOUNTER — Ambulatory Visit: Payer: TRICARE For Life (TFL)

## 2019-12-06 ENCOUNTER — Encounter (HOSPITAL_COMMUNITY): Payer: Self-pay | Admitting: Hematology and Oncology

## 2019-12-06 ENCOUNTER — Ambulatory Visit: Payer: TRICARE For Life (TFL)

## 2019-12-06 NOTE — Progress Notes (Signed)
[facility]  [address] [place], [place] [address] Phone: [phone]  Fax: [phone]   Clinic Day:  12/08/2019  Referring physician: Tracie Harrier, MD  Chief Complaint: Paul Po. is a 86 y.o. 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  HPI: The patient was last seen in the hematology clinic on 11/21/2019. At that time, he felt good. His energy level was at 75%. His constipation was well managed with Miralax. Skin biopsy from his ear on 11/20/2019 revealed well differentiated squamous cell skin cancer. He had right MCP trauma with thickening of the tendon, serous drainage and redness. He was to be seen at [facility]. We discussed bone marrow aspirate and biopsy secondary to persistent leukopenia and anemia requiring increased transfusion support.  Bone marrow biopsy on 11/27/2019 revealed a hypercellular bone marrow (60-80%) with myelodysplastic syndrome.  The morphologic features were similar to the previous biopsy.  There was no definite increase in blasts. Flow cytometry revealed no significant CD34-positive blastic population, increase in monocytic cells (up to 18%), T cells with nonspecific phenotypic changes, and no monoclonal B-cell population identified.  Cytogenetics were normal (46, XY).  FISH was negative. Next-Gen myeloid profile revealed abnormalities in ASXL1, EZH2, and RUNX1 genes.  Labs followed: 11/27/2019: Hematocrit 25.8, hemoglobin 7.7, MCV 90.5, platelets 227,000, WBC 2400, ANC 1000.  11/28/2019: Hematocrit 25.9, hemoglobin 7.7, MCV 90.9, platelets 258,000, WBC 3600, ANC 1600.   He received 1 unit of PRBCs on 11/30/2019 for symptomatic anemia.  During the interim, his hand injury has healed; he followed up with [facility] and noted there were no complications in his healing process. His bone marrow results were discussed. His wife says he is sometimes  low on energy. He denied any shortness of breath. His appetite is good and he is able to do his day-to-day activities alone.  He is eating well.  Weight is stable.   Past Medical History:  Diagnosis Date  . Anemia   . Arthritis   . BPH (benign prostatic hypertrophy)   . Cancer ([place])   . Complication of anesthesia    nausea  . Diabetes type 2, controlled ([place])   . HOH (hard of hearing)    r and L ears-70% loss per pt  . Hyperlipidemia   . Hypertension   . Myelodysplasia (myelodysplastic syndrome) ([place]) 2019    Past Surgical History:  Procedure Laterality Date  . APPENDECTOMY    . COLONOSCOPY  09/21/08   1 polyp found, tubular adenoma  . HAND SURGERY    . KNEE SURGERY Left     Family History  Problem Relation Age of Onset  . Aneurysm Mother   . Heart disease Father   . Cancer Brother        skin  . Heart disease Brother   . Heart disease Brother   . Cancer Sister        skin  . Aneurysm Brother   . Heart disease Brother   . Heart disease Sister   . Heart disease Sister   . COPD Sister   . Diabetes Sister     Social History:  reports that he has never smoked. He has never used smokeless tobacco. He reports that he does not drink alcohol or use drugs. 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 Patient denies known exposures to radiation on toxins. 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 He had  his 31-year marriage anniversary on 08/23/2018.He is walking 1-36mles per day.[place]. The patient is accompanied by his wife and his son on Ipad today.  Allergies: No Known Allergies  Current Medications: Current Outpatient Medications  Medication Sig Dispense Refill  . aspirin 81 MG chewable tablet Chew 81 mg by mouth daily.    . Cranberry (THERACRAN PO) Take 1 tablet by mouth daily.     . cyanocobalamin 1000 MCG tablet Take 1,000 mcg by mouth daily.    . finasteride (PROSCAR) 5 MG tablet Take 5 mg by  mouth daily.    .Marland Kitchenglimepiride (AMARYL) 1 MG tablet Take 1 tablet by mouth daily after breakfast.    . glucose blood test strip FreeStyle Lite Strips    . LANCETS ULTRA FINE MISC Lancets,Ultra Thin 26 gauge    . lisinopril (PRINIVIL,ZESTRIL) 10 MG tablet Take 10 mg by mouth daily.    .Marland Kitchenloperamide (IMODIUM) 2 MG capsule Take 1 capsule (2 mg total) by mouth See admin instructions. With onset of loose stool, take 437mfollowed by 78m26mvery 2 hours until loose bowel movement stopped. Maximum: 16 mg/day 30 capsule 0  . meclizine (ANTIVERT) 25 MG tablet Take 25 mg by mouth 2 (two) times daily as needed.     . metformin (FORTAMET) 1000 MG (OSM) 24 hr tablet Take 1,000 mg by mouth 2 (two) times daily with a meal.    . Multiple Vitamins-Minerals (CENTRUM SILVER PO) Take by mouth.    . Omega-3 Fatty Acids (FISH OIL PO) Take 1 tablet by mouth daily.     . ondansetron (ZOFRAN) 4 MG tablet Take 1 tablet (4 mg total) by mouth every 6 (six) hours as needed for nausea or vomiting. 30 tablet 2  . Polyethylene Glycol 3350 (MIRALAX PO) Take by mouth as needed.     . SClarnce Flocklmetto-Phytosterols (PROSTATE SR PO) Take 1 tablet by mouth daily.     . simvastatin (ZOCOR) 40 MG tablet Take 40 mg by mouth daily.     No current facility-administered medications for this visit.   Facility-Administered Medications Ordered in Other Visits  Medication Dose Route Frequency Provider Last Rate Last Admin  . acetaminophen (TYLENOL) tablet 650 mg  650 mg Oral Once CorNolon Stalls MD      . diphenhydrAMINE (BENADRYL) capsule 25 mg  25 mg Oral Once CorLequita AsalD        Review of Systems  Constitutional: Negative.  Negative for chills, diaphoresis, fever, malaise/fatigue and weight loss.       Feels good.  HENT: Positive for hearing loss. Negative for congestion, ear pain, nosebleeds, sinus pain and sore throat.   Eyes: Negative.  Negative for blurred vision, double vision and photophobia.  Respiratory: Negative.   Negative for cough, hemoptysis, sputum production and shortness of breath.   Cardiovascular: Negative.  Negative for chest pain, palpitations, orthopnea and leg swelling.  Gastrointestinal: Positive for constipation (well managed with Miralax). Negative for abdominal pain, blood in stool, diarrhea, heartburn, nausea and vomiting.  Genitourinary: Negative.  Negative for dysuria, frequency, hematuria and urgency.  Musculoskeletal: Negative.  Negative for back pain, falls, myalgias and neck pain.  Skin: Negative.  Negative for rash.  Neurological: Negative for dizziness, sensory change, speech change, focal weakness, weakness and headaches.  Endo/Heme/Allergies: Negative for environmental allergies. Does not bruise/bleed easily.       Diabetes.  Psychiatric/Behavioral: Negative.  Negative for depression and memory loss. The patient  is not nervous/anxious and does not have insomnia.   All other systems reviewed and are negative.  Performance status (ECOG):  1  Vitals Blood pressure (!) 127/49, pulse 76, temperature (!) 97.4 F (36.3 C), temperature source Tympanic, resp. rate 18, weight 180 lb 7.1 oz (81.8 kg), SpO2 100 %.   Physical Exam  Constitutional: He is oriented to person, place, and time. He appears well-developed and well-nourished. No distress. Face mask in place.  HENT:  Head: Normocephalic and atraumatic.  Mouth/Throat: Oropharynx is clear and moist and mucous membranes are normal. No oral lesions. No oropharyngeal exudate.  Sparse short gray hair.  Near alopecia/male pattern baldness.  Hearing aid.   Eyes: Pupils are equal, round, and reactive to light. Conjunctivae and EOM are normal.  Gold rimmed glasses.  Blue eyes.  Neck: No JVD present.  Cardiovascular: Normal rate, regular rhythm and normal heart sounds. Exam reveals no gallop and no friction rub.  No murmur heard. Pulmonary/Chest: Effort normal and breath sounds normal. He has no wheezes. He has no rhonchi. He has no  rales.  Abdominal: Soft. Normal appearance and bowel sounds are normal. He exhibits no distension and no mass. There is no hepatosplenomegaly. There is no abdominal tenderness. There is no rebound, no guarding and no CVA tenderness.  Musculoskeletal:        General: No tenderness or edema. Normal range of motion.     Cervical back: Normal range of motion and neck supple.  Lymphadenopathy:       Head (right side): No preauricular, no posterior auricular and no occipital adenopathy present.       Head (left side): No preauricular, no posterior auricular and no occipital adenopathy present.    He has no cervical adenopathy.       Right cervical: No superficial cervical adenopathy present.   He has no axillary adenopathy.       Right: No inguinal and no supraclavicular adenopathy present.       Left: No inguinal and no supraclavicular adenopathy present.  Neurological: He is alert and oriented to person, place, and time.  Skin: Skin is warm, dry and intact. No bruising, no lesion and no rash noted. He is not diaphoretic. No erythema. No pallor.  Psychiatric: He has a normal mood and affect. His behavior is normal. Judgment and thought content normal.     No visits with results within 3 Day(s) from this visit.  Latest known visit with results is:  Orders Only on 11/28/2019  Component Date Value Ref Range Status  . ABO/RH(D) 11/28/2019 B POS   Final  . Antibody Screen 11/28/2019 NEG   Final  . Sample Expiration 11/28/2019 12/01/2019,2359   Final  . Unit Number 11/28/2019 [id number]   Final  . Blood Component Type 11/28/2019 RBC, LR IRR   Final  . Unit division 11/28/2019 00   Final  . Status of Unit 11/28/2019 ISSUED,FINAL   Final  . Transfusion Status 11/28/2019 OK TO TRANSFUSE   Final  . Crossmatch Result 11/28/2019    Final                   Value:Compatible Performed at [facility], [address], [place], [place] [address]   . ISSUE DATE / TIME 11/28/2019 [id number]    Final  . Blood Product Unit Number 11/28/2019 [id number]   Final  . PRODUCT CODE 11/28/2019 [id number]   Final  . Unit Type and Rh 11/28/2019 7300   Final  .  Blood Product Expiration Date 11/28/2019 [id number]   Final    Assessment:  Paul Jasmin. is a 86 y.o. 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 Bone marrowon 09/22/2017 revealed a hypercellular for age with dyspoietic changes variably involving myeloid cell lines, but with main involvement of the granulocytic/monocytic cell line. This was associated with bone marrow monocytosis and borderline number to slight increase in blastic cells. The overall changes favor a primary myeloid neoplasm, particularly a myelodysplastic syndrome especially refractory anemia with excess blasts (RAEB-1) or possibly refractory cytopenia with multilineage dysplasia. Consideration was also given to an evolving myelodysplastic/myeloproliferative neoplasm such as chronic myelomonocytic leukemia but the lack of absolute peripheral monocytosis precluded such a diagnosis at this time. Flow cytometrywas negative. Cytogeneticswere normal (46, XY). Foundation one was positive for ASXL1 J009FG*18, EZH2 Splice site 299-3Z>J, RUNX1 [id number]. IPSS-R4.5, intermediate group.  Anemia work-up on 08/17/2017: Vitamin B12 (658), folate (22.0), and TSH (2.688). Retic was 2.6%. Haptoglobin was 116 on 08/19/2017. Epo levelwas 104.8 on 10/05/2017.  Ferritin has been followed: 106 on 08/17/2017, 131 on 11/04/2017, 144 on 05/30/2018, 394 on 09/01/2018, 441 on 11/28/2018, 394 on 01/02/2019, 480 on 02/06/2019, 392 on 03/13/2019, 527 on 04/17/2019, 533 on 05/29/2019, 476 on 06/05/2019, 517 on 07/17/2019, 685 on 08/28/2019, and 724 on 10/16/2019. Iron saturation was 34% on 08/17/2017 and 89% on 09/01/2018.  Bone marrowon 09/22/2017 revealed a hypercellular for age with dyspoietic changes variably involving myeloid cell lines, but with main involvement of the granulocytic/monocytic cell line. This  was associated with bone marrow monocytosis and borderline number to slight increase in blasts (5%). The overall changes favor a primary myeloid neoplasm, particularly a myelodysplastic syndrome especially refractory anemia with excess blasts (RAEB-1) or possibly refractory cytopenia with multilineage dysplasia. Consideration was also given to an evolving myelodysplastic/myeloproliferative neoplasm such as chronic myelomonocytic leukemia but the lack of absolute peripheral monocytosis precluded such a diagnosis at this time. Flow cytometrywas negative. Cytogeneticswere normal (46, XY). Foundation One was positive for ASXL1 I967EL*38, EZH2 Splice site 101-7P>Z, RUNX1 [id number].  He has received 20cycles ofVidaza(11/04/2017 - 08/22/2018; 10/24/2018-10/23/2019). He has received Aranesp(initially 150 mcg every 2 weeks post chemotherapy then 300 mcg every 1-2 weeks after cycle #3; last 09/19/2018). He last received Aranesp on03/16/2021. He has received 1-2units of PRBCswith each cycle (last04/09/2019).He has received28units of PRBCs since 08/17/2017.  Bone marrowon 08/12/2018 revealed a hypercellular marrow with dyspoietic changes. There was a borderline number of blasts (5%) seen by morphology. The findings were similar to previous biopsy although the cellularity is slightly higher in the current material.consistent with previously known myelodysplastic syndrome. Flow cytometry was negative. Cytogenetics were normal (46, XY).  Bone marrow on 11/27/2019 revealed a hypercellular bone marrow (60-80%) with myelodysplastic syndrome.  The morphologic features were similar to the previous biopsy.  There was no definite increase in blasts. Flow cytometry revealed no significant CD34-positive blastic population, increase in monocytic cells (up to 18%), T cells with nonspecific phenotypic changes, and no monoclonal B-cell population identified.  Cytogenetics were normal (46, XY).  FISH was  negative. Next-Gen myeloid disorder profile reveals ASXL1 W258NI*77, EZH2 splice site 824-2P>N, splice site T.614-4RXV, splice site Q.[id number]_[id number], RUNX1 A838s*56.  There were no abnormalities in FLT3, IDH1, IDH2, NPM1.  HereceivedbothPfizervaccinesfor COVID-19(firston01/01/2020).  Symptomatically, he feels good.  Energy level is fair.  He denies any fevers, sweats or weight loss.  Exam is stable.  Hemoglobin is 8.1.  WBC 3800 (Roberts 2100).  Plan: 1.   Labs today:  CBC with diff. 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 Helast received Vidazaon 10/23/2019. Treatmentgoalhas been to decrease transfusion dependence and prevent acceleration of disease (blasts). Review trend in counts. Review interval bone marrow.   Marrow is similar to 08/12/2018.  There was no increase in blasts.   Monocyte count is 18%.   Next-Gen myeloid disorder profile reveals ASXL1 T060QN*15, EZH2 splice site 195-1D>Z, splice site H.565-2NCE, splice site E.443-2_[id number], RUNX1 A838s*56.   There were no abnormalities in FLT3, IDH1, IDH2, NPM1. Present at hematology conference.  Discuss referral back to [facility]. Morton Peters at [facility] for potential treatment options.  Discuss ongoing supportive care (Aranesp and transfusions). 3. Iron overload Ferritin724on 10/16/2019. He has received28units of PRBCs to date (08/17/2017 -11/30/2019). Postpone initiation of Jadenu secondary to issues with leukopenia with recent cycles. 4.   MD to call patient/son after conference with [facility]. 5.   RTC in 2 weeks for labs (CBC with diff, hold tube) and +/- Aranesp. 6.   RTC in 4 weeks for MD assessment, labs (CBC with diff, hold tube), and +/- Aranesp.  I discussed the assessment and treatment plan with the patient.  The patient was provided an  opportunity to ask questions and all were answered.  The patient agreed with the plan and demonstrated an understanding of the instructions.  The patient was advised to call back if the symptoms worsen or if the condition fails to improve as anticipated.   Lequita Asal, MD, PhD    12/08/2019, 9:38 AM  I, Heywood Footman, am acting as Education administrator for [employer]. Mike Gip, MD, PhD.  I, Emarion Toral C. Mike Gip, MD, have reviewed the above documentation for accuracy and completeness, and I agree with the above.

## 2019-12-07 ENCOUNTER — Ambulatory Visit: Payer: TRICARE For Life (TFL)

## 2019-12-07 ENCOUNTER — Other Ambulatory Visit: Payer: Self-pay

## 2019-12-07 ENCOUNTER — Encounter: Payer: Self-pay | Admitting: Hematology and Oncology

## 2019-12-07 NOTE — Progress Notes (Signed)
Confirmed patients name and DOB with his wife. Patient is hard of hearing. She states they have no questions or concerns at this time just waiting to hear about bone marrow biopsy results.

## 2019-12-08 ENCOUNTER — Inpatient Hospital Stay (HOSPITAL_BASED_OUTPATIENT_CLINIC_OR_DEPARTMENT_OTHER): Payer: Medicare Other | Admitting: Hematology and Oncology

## 2019-12-08 ENCOUNTER — Encounter: Payer: Self-pay | Admitting: Hematology and Oncology

## 2019-12-08 ENCOUNTER — Ambulatory Visit: Payer: TRICARE For Life (TFL)

## 2019-12-08 ENCOUNTER — Inpatient Hospital Stay: Payer: Medicare Other

## 2019-12-08 VITALS — BP 127/49 | HR 76 | Temp 97.4°F | Resp 18 | Wt 180.4 lb

## 2019-12-08 DIAGNOSIS — D469 Myelodysplastic syndrome, unspecified: Secondary | ICD-10-CM | POA: Diagnosis not present

## 2019-12-08 DIAGNOSIS — Z7189 Other specified counseling: Secondary | ICD-10-CM

## 2019-12-08 DIAGNOSIS — D4621 Refractory anemia with excess of blasts 1: Secondary | ICD-10-CM | POA: Diagnosis not present

## 2019-12-08 LAB — CBC WITH DIFFERENTIAL/PLATELET
Abs Immature Granulocytes: 0.07 10*3/uL (ref 0.00–0.07)
Basophils Absolute: 0 10*3/uL (ref 0.0–0.1)
Basophils Relative: 0 %
Eosinophils Absolute: 0 10*3/uL (ref 0.0–0.5)
Eosinophils Relative: 0 %
HCT: 26.2 % — ABNORMAL LOW (ref 39.0–52.0)
Hemoglobin: 8.1 g/dL — ABNORMAL LOW (ref 13.0–17.0)
Immature Granulocytes: 2 %
Lymphocytes Relative: 33 %
Lymphs Abs: 1.3 10*3/uL (ref 0.7–4.0)
MCH: 27.4 pg (ref 26.0–34.0)
MCHC: 30.9 g/dL (ref 30.0–36.0)
MCV: 88.5 fL (ref 80.0–100.0)
Monocytes Absolute: 0.4 10*3/uL (ref 0.1–1.0)
Monocytes Relative: 10 %
Neutro Abs: 2.1 10*3/uL (ref 1.7–7.7)
Neutrophils Relative %: 55 %
Platelets: 195 10*3/uL (ref 150–400)
RBC: 2.96 MIL/uL — ABNORMAL LOW (ref 4.22–5.81)
RDW: 16 % — ABNORMAL HIGH (ref 11.5–15.5)
WBC: 3.8 10*3/uL — ABNORMAL LOW (ref 4.0–10.5)
nRBC: 0 % (ref 0.0–0.2)

## 2019-12-08 NOTE — Progress Notes (Signed)
Pt in for visit today, he and wife deny any changes since speaking with RN yesterday for pre assessment.  Pt son is in conference room with Ipad for visit.

## 2019-12-11 ENCOUNTER — Inpatient Hospital Stay: Payer: Medicare Other

## 2019-12-11 ENCOUNTER — Other Ambulatory Visit: Payer: Self-pay

## 2019-12-11 VITALS — BP 154/67 | HR 70 | Temp 97.1°F | Resp 18

## 2019-12-11 DIAGNOSIS — D469 Myelodysplastic syndrome, unspecified: Secondary | ICD-10-CM

## 2019-12-11 DIAGNOSIS — D4621 Refractory anemia with excess of blasts 1: Secondary | ICD-10-CM | POA: Diagnosis not present

## 2019-12-11 DIAGNOSIS — Z7189 Other specified counseling: Secondary | ICD-10-CM

## 2019-12-11 LAB — CBC WITH DIFFERENTIAL/PLATELET
Abs Immature Granulocytes: 0.09 10*3/uL — ABNORMAL HIGH (ref 0.00–0.07)
Basophils Absolute: 0 10*3/uL (ref 0.0–0.1)
Basophils Relative: 0 %
Eosinophils Absolute: 0 10*3/uL (ref 0.0–0.5)
Eosinophils Relative: 0 %
HCT: 25.7 % — ABNORMAL LOW (ref 39.0–52.0)
Hemoglobin: 7.9 g/dL — ABNORMAL LOW (ref 13.0–17.0)
Immature Granulocytes: 3 %
Lymphocytes Relative: 43 %
Lymphs Abs: 1.2 10*3/uL (ref 0.7–4.0)
MCH: 27.5 pg (ref 26.0–34.0)
MCHC: 30.7 g/dL (ref 30.0–36.0)
MCV: 89.5 fL (ref 80.0–100.0)
Monocytes Absolute: 0.3 10*3/uL (ref 0.1–1.0)
Monocytes Relative: 11 %
Neutro Abs: 1.2 10*3/uL — ABNORMAL LOW (ref 1.7–7.7)
Neutrophils Relative %: 43 %
Platelets: 195 10*3/uL (ref 150–400)
RBC: 2.87 MIL/uL — ABNORMAL LOW (ref 4.22–5.81)
RDW: 15.9 % — ABNORMAL HIGH (ref 11.5–15.5)
WBC: 2.9 10*3/uL — ABNORMAL LOW (ref 4.0–10.5)
nRBC: 0 % (ref 0.0–0.2)

## 2019-12-11 LAB — SAMPLE TO BLOOD BANK

## 2019-12-11 MED ORDER — DARBEPOETIN ALFA 300 MCG/0.6ML IJ SOSY
300.0000 ug | PREFILLED_SYRINGE | Freq: Once | INTRAMUSCULAR | Status: AC
  Administered 2019-12-11: 11:00:00 300 ug via SUBCUTANEOUS

## 2019-12-21 ENCOUNTER — Encounter: Payer: Self-pay | Admitting: Internal Medicine

## 2019-12-22 ENCOUNTER — Encounter (HOSPITAL_COMMUNITY): Payer: Self-pay | Admitting: Hematology and Oncology

## 2019-12-22 ENCOUNTER — Telehealth: Payer: Self-pay

## 2019-12-22 NOTE — Telephone Encounter (Signed)
Spoke with patient's wife and informed her of patient's HGB level that ws received by Dr. Linton Ham office. Lab was collected on 12/20/2019 and was sent to [facility] on 12/22/2019. Wife reports patient is feeling fine. Some decrease in energy but other than that no symptoms. Patient is already scheduled to attend office on Monday for cbc and hold tube. Educated wife on the importance of taking patient to the ED should he experience any symptoms. Wife verbalizes understanding and states she will watch him closely. Denies any other questions or concerns.

## 2019-12-25 ENCOUNTER — Inpatient Hospital Stay: Payer: Medicare Other

## 2019-12-25 ENCOUNTER — Other Ambulatory Visit: Payer: Self-pay | Admitting: Hematology and Oncology

## 2019-12-25 ENCOUNTER — Other Ambulatory Visit: Payer: Self-pay | Admitting: *Deleted

## 2019-12-25 ENCOUNTER — Other Ambulatory Visit: Payer: Self-pay

## 2019-12-25 VITALS — BP 133/64 | HR 76 | Temp 97.1°F | Resp 18

## 2019-12-25 DIAGNOSIS — D469 Myelodysplastic syndrome, unspecified: Secondary | ICD-10-CM

## 2019-12-25 DIAGNOSIS — Z7189 Other specified counseling: Secondary | ICD-10-CM

## 2019-12-25 DIAGNOSIS — D4621 Refractory anemia with excess of blasts 1: Secondary | ICD-10-CM | POA: Diagnosis not present

## 2019-12-25 LAB — CBC WITH DIFFERENTIAL/PLATELET
Abs Immature Granulocytes: 0.03 10*3/uL (ref 0.00–0.07)
Basophils Absolute: 0 10*3/uL (ref 0.0–0.1)
Basophils Relative: 0 %
Eosinophils Absolute: 0 10*3/uL (ref 0.0–0.5)
Eosinophils Relative: 1 %
HCT: 22.6 % — ABNORMAL LOW (ref 39.0–52.0)
Hemoglobin: 6.7 g/dL — ABNORMAL LOW (ref 13.0–17.0)
Immature Granulocytes: 1 %
Lymphocytes Relative: 43 %
Lymphs Abs: 1.1 10*3/uL (ref 0.7–4.0)
MCH: 26.9 pg (ref 26.0–34.0)
MCHC: 29.6 g/dL — ABNORMAL LOW (ref 30.0–36.0)
MCV: 90.8 fL (ref 80.0–100.0)
Monocytes Absolute: 0.3 10*3/uL (ref 0.1–1.0)
Monocytes Relative: 12 %
Neutro Abs: 1 10*3/uL — ABNORMAL LOW (ref 1.7–7.7)
Neutrophils Relative %: 43 %
Platelets: 189 10*3/uL (ref 150–400)
RBC: 2.49 MIL/uL — ABNORMAL LOW (ref 4.22–5.81)
RDW: 16.9 % — ABNORMAL HIGH (ref 11.5–15.5)
WBC: 2.4 10*3/uL — ABNORMAL LOW (ref 4.0–10.5)
nRBC: 0.8 % — ABNORMAL HIGH (ref 0.0–0.2)

## 2019-12-25 LAB — SAMPLE TO BLOOD BANK

## 2019-12-25 LAB — PREPARE RBC (CROSSMATCH)

## 2019-12-25 MED ORDER — DARBEPOETIN ALFA 300 MCG/0.6ML IJ SOSY
300.0000 ug | PREFILLED_SYRINGE | Freq: Once | INTRAMUSCULAR | Status: AC
  Administered 2019-12-25: 300 ug via SUBCUTANEOUS

## 2019-12-25 NOTE — Progress Notes (Signed)
Patient to return tomorrow for one unit of PRBCs.

## 2019-12-26 ENCOUNTER — Inpatient Hospital Stay: Payer: Medicare Other

## 2019-12-26 DIAGNOSIS — D4621 Refractory anemia with excess of blasts 1: Secondary | ICD-10-CM | POA: Diagnosis not present

## 2019-12-26 DIAGNOSIS — D469 Myelodysplastic syndrome, unspecified: Secondary | ICD-10-CM

## 2019-12-26 LAB — SURGICAL PATHOLOGY

## 2019-12-26 MED ORDER — DIPHENHYDRAMINE HCL 25 MG PO CAPS: 25.0000 mg | ORAL_CAPSULE | Freq: Once | ORAL | Status: AC

## 2019-12-26 MED ORDER — SODIUM CHLORIDE 0.9% IV SOLUTION
250.0000 mL | Freq: Once | INTRAVENOUS | Status: AC
  Administered 2019-12-26: 13:00:00 250 mL via INTRAVENOUS
  Filled 2019-12-26: qty 250

## 2019-12-26 MED ORDER — ACETAMINOPHEN 325 MG PO TABS: 650.0000 mg | ORAL_TABLET | Freq: Once | ORAL | Status: AC

## 2019-12-26 NOTE — Patient Instructions (Signed)

## 2019-12-27 LAB — TYPE AND SCREEN
ABO/RH(D): B POS
Antibody Screen: NEGATIVE
Unit division: 0

## 2019-12-27 LAB — BPAM RBC
Blood Product Expiration Date: 202105202359
ISSUE DATE / TIME: 202104271348
Unit Type and Rh: 7300

## 2020-01-03 ENCOUNTER — Other Ambulatory Visit: Payer: Self-pay

## 2020-01-03 ENCOUNTER — Other Ambulatory Visit: Payer: Self-pay | Admitting: Hematology and Oncology

## 2020-01-03 ENCOUNTER — Inpatient Hospital Stay: Payer: Medicare Other | Attending: Hematology and Oncology

## 2020-01-03 DIAGNOSIS — D469 Myelodysplastic syndrome, unspecified: Secondary | ICD-10-CM

## 2020-01-03 DIAGNOSIS — D4621 Refractory anemia with excess of blasts 1: Secondary | ICD-10-CM | POA: Insufficient documentation

## 2020-01-03 DIAGNOSIS — D649 Anemia, unspecified: Secondary | ICD-10-CM

## 2020-01-03 LAB — CBC WITH DIFFERENTIAL/PLATELET
Abs Immature Granulocytes: 0.02 10*3/uL (ref 0.00–0.07)
Basophils Absolute: 0 10*3/uL (ref 0.0–0.1)
Basophils Relative: 0 %
Eosinophils Absolute: 0.2 10*3/uL (ref 0.0–0.5)
Eosinophils Relative: 5 %
HCT: 22.8 % — ABNORMAL LOW (ref 39.0–52.0)
Hemoglobin: 6.8 g/dL — ABNORMAL LOW (ref 13.0–17.0)
Immature Granulocytes: 1 %
Lymphocytes Relative: 45 %
Lymphs Abs: 1.4 10*3/uL (ref 0.7–4.0)
MCH: 25.6 pg — ABNORMAL LOW (ref 26.0–34.0)
MCHC: 29.8 g/dL — ABNORMAL LOW (ref 30.0–36.0)
MCV: 85.7 fL (ref 80.0–100.0)
Monocytes Absolute: 0.4 10*3/uL (ref 0.1–1.0)
Monocytes Relative: 13 %
Neutro Abs: 1.1 10*3/uL — ABNORMAL LOW (ref 1.7–7.7)
Neutrophils Relative %: 36 %
Platelets: 184 10*3/uL (ref 150–400)
RBC: 2.66 MIL/uL — ABNORMAL LOW (ref 4.22–5.81)
RDW: 19.1 % — ABNORMAL HIGH (ref 11.5–15.5)
WBC: 3.1 10*3/uL — ABNORMAL LOW (ref 4.0–10.5)
nRBC: 1.3 % — ABNORMAL HIGH (ref 0.0–0.2)

## 2020-01-04 ENCOUNTER — Other Ambulatory Visit: Payer: Self-pay

## 2020-01-04 DIAGNOSIS — D649 Anemia, unspecified: Secondary | ICD-10-CM

## 2020-01-04 DIAGNOSIS — D469 Myelodysplastic syndrome, unspecified: Secondary | ICD-10-CM

## 2020-01-04 LAB — PREPARE RBC (CROSSMATCH)

## 2020-01-04 NOTE — Progress Notes (Signed)
[facility]  [address] [place], [place] [address] Phone: [phone]  Fax: [phone]   Clinic Day:  01/08/2020  Referring physician: Tracie Harrier, MD  Chief Complaint: Paul Pham. is a 86 y.o. 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  HPI: The patient was last seen in the hematology clinic on 12/08/2019. At that time, he felt good.  Energy level was fair.  He denied any fevers, sweats or weight loss.  Exam was stable.  Hematocrit was 26.2, hemoglobin 8.1, platelets 195,000, WBC 3,800 (ANC 2,100).  We reviewed the interval bone marrow.  Marrow was similar to 12/13/20219.  We discussed referral to [facility].  Patient received Aranesp on 12/11/2019 and 12/25/2019.    Patient received 1 unit of PRBC on 12/26/2019 and 01/05/2020.   CBC followed: 12/11/2019: Hematocrit 25.7, hemoglobin 7.9, MCV 89.5, platelets 195,000, WBC 2,900 (ANC 1,200).  12/25/2019: Hematocrit 22.6, hemoglobin 6.7, MCV 90.8, platelets 189,000, WBC 2,400 (ANC 1,000).  01/03/2020: Hematocrit 22.8, hemoglobin 6.8, MCV 85.7, platelets 184,000, WBC 3,100 (ANC 1,100).   During the interim, he felt "fair". His wife notes he is doing better since the transfusion on Friday. He denies any infections. He denies any B symptoms. He is eating well. His wife notes he has a good appetite. Patient will seen Dr. Katina Dung in 1 month.   Patient agreed to a transfusion. Patient's wife would like to have all of his care done here in the clinic.     Past Medical History:  Diagnosis Date  . Anemia   . Arthritis   . BPH (benign prostatic hypertrophy)   . Cancer ([place])   . Complication of anesthesia    nausea  . Diabetes type 2, controlled ([place])   . HOH (hard of hearing)    r and L ears-70% loss per pt  . Hyperlipidemia   . Hypertension   . Myelodysplasia (myelodysplastic syndrome) ([place]) 2019    Past Surgical History:  Procedure Laterality Date  . APPENDECTOMY    .  COLONOSCOPY  09/21/08   1 polyp found, tubular adenoma  . HAND SURGERY    . KNEE SURGERY Left     Family History  Problem Relation Age of Onset  . Aneurysm Mother   . Heart disease Father   . Cancer Brother        skin  . Heart disease Brother   . Heart disease Brother   . Cancer Sister        skin  . Aneurysm Brother   . Heart disease Brother   . Heart disease Sister   . Heart disease Sister   . COPD Sister   . Diabetes Sister     Social History:  reports that he has never smoked. He has never used smokeless tobacco. He reports that he does not drink alcohol or use drugs. 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 Patient denies known exposures to radiation on toxins. 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 He had his 25-year marriage anniversary on 08/23/2018.He is walking 1-22mles per day.[place]. The patient is accompanied by his wife today.  Allergies: No Known Allergies  Current Medications: Current Outpatient Medications  Medication Sig Dispense Refill  . aspirin 81 MG chewable tablet Chew 81 mg by mouth daily.    . Cranberry (THERACRAN PO) Take 1 tablet by mouth daily.     . cyanocobalamin 1000  MCG tablet Take 1,000 mcg by mouth daily.    . finasteride (PROSCAR) 5 MG tablet Take 5 mg by mouth daily.    Marland Kitchen glimepiride (AMARYL) 1 MG tablet Take 1 tablet by mouth daily after breakfast.    . glucose blood test strip FreeStyle Lite Strips    . LANCETS ULTRA FINE MISC Lancets,Ultra Thin 26 gauge    . lisinopril (PRINIVIL,ZESTRIL) 10 MG tablet Take 10 mg by mouth daily.    . meclizine (ANTIVERT) 25 MG tablet Take 25 mg by mouth 2 (two) times daily as needed.     . metformin (FORTAMET) 1000 MG (OSM) 24 hr tablet Take 1,000 mg by mouth 2 (two) times daily with a meal.    . Multiple Vitamins-Minerals (CENTRUM SILVER PO) Take by mouth.    . Omega-3 Fatty Acids (FISH OIL PO) Take 1 tablet by mouth daily.       . Polyethylene Glycol 3350 (MIRALAX PO) Take by mouth as needed.     [name] (PROSTATE SR PO) Take 1 tablet by mouth daily.     . simvastatin (ZOCOR) 40 MG tablet Take 40 mg by mouth daily.    Marland Kitchen loperamide (IMODIUM) 2 MG capsule Take 1 capsule (2 mg total) by mouth See admin instructions. With onset of loose stool, take 83m followed by 235mevery 2 hours until loose bowel movement stopped. Maximum: 16 mg/day (Patient not taking: Reported on 01/08/2020) 30 capsule 0  . ondansetron (ZOFRAN) 4 MG tablet Take 1 tablet (4 mg total) by mouth every 6 (six) hours as needed for nausea or vomiting. (Patient not taking: Reported on 01/08/2020) 30 tablet 2   No current facility-administered medications for this visit.   Facility-Administered Medications Ordered in Other Visits  Medication Dose Route Frequency Provider Last Rate Last Admin  . acetaminophen (TYLENOL) tablet 650 mg  650 mg Oral Once CoNolon Stalls, MD      . acetaminophen (TYLENOL) tablet 650 mg  650 mg Oral Once CoNolon Stalls, MD      . diphenhydrAMINE (BENADRYL) capsule 25 mg  25 mg Oral Once CoNolon Stalls, MD      . diphenhydrAMINE (BENADRYL) capsule 25 mg  25 mg Oral Once CoLequita AsalMD        Review of Systems  Constitutional: Positive for weight loss (3 lbs). Negative for chills, diaphoresis, fever and malaise/fatigue.       Feels "fair".  HENT: Positive for hearing loss. Negative for congestion, ear discharge, ear pain, nosebleeds, sinus pain, sore throat and tinnitus.   Eyes: Negative for blurred vision.  Respiratory: Negative for cough, hemoptysis, sputum production and shortness of breath.   Cardiovascular: Negative for chest pain, palpitations and leg swelling.  Gastrointestinal: Negative for abdominal pain, blood in stool, constipation (well managed with miralax), diarrhea, heartburn, melena, nausea and vomiting.       Eating well. Good appetite.  Genitourinary: Negative for dysuria,  frequency, hematuria and urgency.  Musculoskeletal: Negative for back pain, joint pain, myalgias and neck pain.  Neurological: Negative for dizziness, tingling, sensory change, weakness and headaches.  Endo/Heme/Allergies: Does not bruise/bleed easily.       Diabetes.  Psychiatric/Behavioral: Negative for depression and memory loss. The patient is not nervous/anxious and does not have insomnia.   All other systems reviewed and are negative.  Performance status (ECOG): 1  Vitals Blood pressure 131/60, pulse 80, temperature (!) 97.3 F (36.3 C), temperature source Tympanic, resp. rate 18, height '6\' 1"'  (  1.854 m), weight 177 lb 5.8 oz (80.4 kg), SpO2 100 %.   Physical Exam  Constitutional: He is oriented to person, place, and time. He appears well-developed and well-nourished. No distress.  HENT:  Head: Normocephalic and atraumatic.  Mouth/Throat: Oropharynx is clear and moist. No oropharyngeal exudate.  Sparse short gray hair.  Near alopecia/male pattern baldness.  Mask.  Hearing aid.  Eyes: Pupils are equal, round, and reactive to light. Conjunctivae and EOM are normal. No scleral icterus.  Gold rimmed glasses.  Blue eyes.  Cardiovascular: Normal rate, regular rhythm and normal heart sounds.  No murmur heard. Pulmonary/Chest: Effort normal and breath sounds normal. No respiratory distress. He has no wheezes. He has no rales. He exhibits no tenderness.  Abdominal: Soft. Bowel sounds are normal. He exhibits no distension and no mass. There is no abdominal tenderness. There is no rebound and no guarding.  Musculoskeletal:        General: No tenderness or edema. Normal range of motion.     Cervical back: Normal range of motion and neck supple.  Lymphadenopathy:    He has no cervical adenopathy.    He has no axillary adenopathy.  Neurological: He is alert and oriented to person, place, and time.  Skin: Skin is warm and dry. He is not diaphoretic.  Psychiatric: He has a normal mood and  affect. His behavior is normal. Judgment and thought content normal.  Nursing note and vitals reviewed.   Appointment on 01/08/2020  Component Date Value Ref Range Status  . WBC 01/08/2020 3.6* 4.0 - 10.5 K/uL Final  . RBC 01/08/2020 2.96* 4.22 - 5.81 MIL/uL Final  . Hemoglobin 01/08/2020 7.6* 13.0 - 17.0 g/dL Final  . HCT 01/08/2020 25.3* 39.0 - 52.0 % Final  . MCV 01/08/2020 85.5  80.0 - 100.0 fL Final  . MCH 01/08/2020 25.7* 26.0 - 34.0 pg Final  . MCHC 01/08/2020 30.0  30.0 - 36.0 g/dL Final  . RDW 01/08/2020 18.6* 11.5 - 15.5 % Final  . Platelets 01/08/2020 185  150 - 400 K/uL Final  . nRBC 01/08/2020 0.8* 0.0 - 0.2 % Final  . Neutrophils Relative % 01/08/2020 40  % Final  . Neutro Abs 01/08/2020 1.4* 1.7 - 7.7 K/uL Final  . Lymphocytes Relative 01/08/2020 37  % Final  . Lymphs Abs 01/08/2020 1.3  0.7 - 4.0 K/uL Final  . Monocytes Relative 01/08/2020 11  % Final  . Monocytes Absolute 01/08/2020 0.4  0.1 - 1.0 K/uL Final  . Eosinophils Relative 01/08/2020 11  % Final  . Eosinophils Absolute 01/08/2020 0.4  0.0 - 0.5 K/uL Final  . Basophils Relative 01/08/2020 0  % Final  . Basophils Absolute 01/08/2020 0.0  0.0 - 0.1 K/uL Final  . Immature Granulocytes 01/08/2020 1  % Final  . Abs Immature Granulocytes 01/08/2020 0.04  0.00 - 0.07 K/uL Final   Performed at [facility], [address] [place], [place] [address]    Assessment:  Paul Saling. is a 86 y.o. 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 Bone marrowon 09/22/2017 revealed a hypercellular for age with dyspoietic changes variably involving myeloid cell lines, but with main involvement of the granulocytic/monocytic cell line. This was associated with bone marrow monocytosis and borderline number to slight increase in blastic cells. The overall changes favor a primary myeloid neoplasm, particularly a myelodysplastic syndrome especially refractory anemia with excess blasts (RAEB-1) or possibly refractory cytopenia with multilineage  dysplasia. Consideration was also given to an evolving myelodysplastic/myeloproliferative neoplasm such as  chronic myelomonocytic leukemia but the lack of absolute peripheral monocytosis precluded such a diagnosis at this time. Flow cytometrywas negative. Cytogeneticswere normal (46, XY). Foundation one was positive for ASXL1 Q947ML*46, EZH2 Splice site 503-5W>S, RUNX1 [id number]. IPSS-R4.5, intermediate group.  Anemia work-up on 08/17/2017: Vitamin B12 (658), folate (22.0), and TSH (2.688). Retic was 2.6%. Haptoglobin was 116 on 08/19/2017. Epo levelwas 104.8 on 10/05/2017.  Ferritin has been followed: 106 on 08/17/2017, 131 on 11/04/2017, 144 on 05/30/2018, 394 on 09/01/2018, 441 on 11/28/2018, 394 on 01/02/2019, 480 on 02/06/2019, 392 on 03/13/2019, 527 on 04/17/2019, 533 on 05/29/2019, 476 on 06/05/2019, 517 on 07/17/2019, 685 on 08/28/2019, and 724 on 10/16/2019. Iron saturation was 34% on 08/17/2017 and 89% on 09/01/2018.  Bone marrowon 09/22/2017 revealed a hypercellular for age with dyspoietic changes variably involving myeloid cell lines, but with main involvement of the granulocytic/monocytic cell line. This was associated with bone marrow monocytosis and borderline number to slight increase in blasts (5%). The overall changes favor a primary myeloid neoplasm, particularly a myelodysplastic syndrome especially refractory anemia with excess blasts (RAEB-1) or possibly refractory cytopenia with multilineage dysplasia. Consideration was also given to an evolving myelodysplastic/myeloproliferative neoplasm such as chronic myelomonocytic leukemia but the lack of absolute peripheral monocytosis precluded such a diagnosis at this time. Flow cytometrywas negative. Cytogeneticswere normal (46, XY). Foundation One was positive for ASXL1 F681EX*51, EZH2 Splice site 700-1V>C, RUNX1 [id number].  He has received20cycles ofVidaza(11/04/2017 - 08/22/2018; 10/24/2018-10/23/2019). He has  received Aranesp(initially 150 mcg every 2 weeks post chemotherapy then 300 mcg every 1-2 weeks after cycle #3; last 09/19/2018). He last received Aranesp on02/15/2021. He has received 1-2units of PRBCswith each cycle (last04/26/2021).He has received30units of PRBCs since 08/17/2017.  Bone marrowon 08/12/2018 revealed a hypercellular marrow with dyspoietic changes. There was a borderline number of blasts (5%) seen by morphology. The findings were similar to previous biopsy although the cellularity is slightly higher in the current material.consistent with previously known myelodysplastic syndrome. Flow cytometry was negative. Cytogenetics were normal (46, XY).  Bone marrow on 11/27/2019 revealed a hypercellular bone marrow (60-80%) with myelodysplastic syndrome.  The morphologic features were similar to the previous biopsy.  There was no definite increase in blasts. Flow cytometry revealed no significant CD34-positive blastic population, increase in monocytic cells (up to 18%), T cells with nonspecific phenotypic changes, and no monoclonal B-cell population identified.  Cytogenetics were normal (46, XY).  FISH was negative. Next-Gen myeloid disorder profile reveals ASXL1 B449QP*59, EZH2 splice site 163-8G>Y, splice site K.599-3TTS, splice site V.779-3_[id number], RUNX1 A838s*56.  There were no abnormalities in FLT3, IDH1, IDH2, NPM1.  HereceivedbothPfizervaccinesfor COVID-19(firston01/01/2020).  Symptomatically, he feels "fair".  He denies any B symptoms.  He denies any early satiety.  Exam is stable.  Plan: 1.   Labs today:  CBC with diff, ferritin, hold tube. 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  Helast received Vidazaon 10/23/2019. Treatmentgoalhas been to decrease transfusion dependence and prevent acceleration of disease (blasts). Bone marrow on 11/27/2019 was similar  to 08/12/2018.  There was no increase in blasts.                         Monocyte count is 18%.                         Next-Gen myeloid disorder profile reveals ASXL1 A263FH*54, EZH2 splice site 562-5W>L, splice site S.937-3SKA, splice site J.681-1_[id number], RUNX1 A838s*56.  There were no abnormalities in FLT3, IDH1, IDH2, NPM1. Await [facility] consultation regarding potential other options.             Continue ongoing supportive care with transfusions as needed.  No Aranesp. 3. Iron overload Ferritin749today. He has received30 units of PRBCs to date (08/17/2017 -01/05/2020). Jadenu postponed secondary to leukopenia. 4.   Mild leukopenia  WBC 3600.  ANC 1400.  Continue to monitor closely. 5.   RTC on 01/11/2020 for labs (CBC, type and screen). 6.   RTC on 01/12/2020 for 1 unit of PRBCs. 7.   RTC on 01/22/2020 for labs (CBC, type and screen). 8.   RTC on 01/23/2020 for 1 unit of PRBCs. 9.   RTC on 01/30/2020 for labs (CBC, type and screen). 10.   RTC on 01/31/2020 for 1 unit of PRBCs. 11.   RTC on 02/08/2020 for MD assessment, labs (CBC with diff, CMP, ferritin, hold tube) and discussion regarding direction of therapy  I discussed the assessment and treatment plan with the patient.  The patient was provided an opportunity to ask questions and all were answered.  The patient agreed with the plan and demonstrated an understanding of the instructions.  The patient was advised to call back if the symptoms worsen or if the condition fails to improve as anticipated.  I provided 20 minutes of face-to-face time during this this encounter and > 50% was spent counseling as documented under my assessment and plan.    Lequita Asal, MD, PhD    01/08/2020, 10:57 AM  I, Selena Batten, am acting as scribe for [employer]. Mike Gip, MD, PhD.  I, Andris Brothers C. Mike Gip, MD, have reviewed the above documentation for  accuracy and completeness, and I agree with the above.

## 2020-01-05 ENCOUNTER — Inpatient Hospital Stay: Payer: Medicare Other

## 2020-01-05 ENCOUNTER — Other Ambulatory Visit: Payer: Self-pay

## 2020-01-05 DIAGNOSIS — D469 Myelodysplastic syndrome, unspecified: Secondary | ICD-10-CM

## 2020-01-05 DIAGNOSIS — D4621 Refractory anemia with excess of blasts 1: Secondary | ICD-10-CM | POA: Diagnosis not present

## 2020-01-05 DIAGNOSIS — D649 Anemia, unspecified: Secondary | ICD-10-CM

## 2020-01-05 MED ORDER — SODIUM CHLORIDE 0.9% IV SOLUTION
250.0000 mL | Freq: Once | INTRAVENOUS | Status: AC
  Administered 2020-01-05: 250 mL via INTRAVENOUS
  Filled 2020-01-05: qty 250

## 2020-01-05 MED ORDER — DIPHENHYDRAMINE HCL 25 MG PO CAPS: 25.0000 mg | ORAL_CAPSULE | Freq: Once | ORAL | Status: DC

## 2020-01-05 MED ORDER — ACETAMINOPHEN 325 MG PO TABS: 650.0000 mg | ORAL_TABLET | Freq: Once | ORAL | Status: DC

## 2020-01-05 NOTE — Patient Instructions (Signed)

## 2020-01-06 LAB — TYPE AND SCREEN
ABO/RH(D): B POS
Antibody Screen: NEGATIVE
Unit division: 0

## 2020-01-06 LAB — BPAM RBC
Blood Product Expiration Date: 202105182359
ISSUE DATE / TIME: 202105071036
Unit Type and Rh: 9500

## 2020-01-08 ENCOUNTER — Other Ambulatory Visit: Payer: Self-pay | Admitting: Hematology and Oncology

## 2020-01-08 ENCOUNTER — Inpatient Hospital Stay: Payer: Medicare Other

## 2020-01-08 ENCOUNTER — Inpatient Hospital Stay (HOSPITAL_BASED_OUTPATIENT_CLINIC_OR_DEPARTMENT_OTHER): Payer: Medicare Other | Admitting: Hematology and Oncology

## 2020-01-08 ENCOUNTER — Encounter: Payer: Self-pay | Admitting: Hematology and Oncology

## 2020-01-08 ENCOUNTER — Other Ambulatory Visit: Payer: Self-pay

## 2020-01-08 VITALS — BP 131/60 | HR 80 | Temp 97.3°F | Resp 18 | Ht 73.0 in | Wt 177.4 lb

## 2020-01-08 DIAGNOSIS — Z7189 Other specified counseling: Secondary | ICD-10-CM

## 2020-01-08 DIAGNOSIS — D469 Myelodysplastic syndrome, unspecified: Secondary | ICD-10-CM | POA: Diagnosis not present

## 2020-01-08 DIAGNOSIS — D649 Anemia, unspecified: Secondary | ICD-10-CM

## 2020-01-08 DIAGNOSIS — D72819 Decreased white blood cell count, unspecified: Secondary | ICD-10-CM | POA: Insufficient documentation

## 2020-01-08 DIAGNOSIS — D4621 Refractory anemia with excess of blasts 1: Secondary | ICD-10-CM | POA: Diagnosis not present

## 2020-01-08 DIAGNOSIS — D630 Anemia in neoplastic disease: Secondary | ICD-10-CM | POA: Diagnosis not present

## 2020-01-08 LAB — CBC WITH DIFFERENTIAL/PLATELET
Abs Immature Granulocytes: 0.04 10*3/uL (ref 0.00–0.07)
Basophils Absolute: 0 10*3/uL (ref 0.0–0.1)
Basophils Relative: 0 %
Eosinophils Absolute: 0.4 10*3/uL (ref 0.0–0.5)
Eosinophils Relative: 11 %
HCT: 25.3 % — ABNORMAL LOW (ref 39.0–52.0)
Hemoglobin: 7.6 g/dL — ABNORMAL LOW (ref 13.0–17.0)
Immature Granulocytes: 1 %
Lymphocytes Relative: 37 %
Lymphs Abs: 1.3 10*3/uL (ref 0.7–4.0)
MCH: 25.7 pg — ABNORMAL LOW (ref 26.0–34.0)
MCHC: 30 g/dL (ref 30.0–36.0)
MCV: 85.5 fL (ref 80.0–100.0)
Monocytes Absolute: 0.4 10*3/uL (ref 0.1–1.0)
Monocytes Relative: 11 %
Neutro Abs: 1.4 10*3/uL — ABNORMAL LOW (ref 1.7–7.7)
Neutrophils Relative %: 40 %
Platelets: 185 10*3/uL (ref 150–400)
RBC: 2.96 MIL/uL — ABNORMAL LOW (ref 4.22–5.81)
RDW: 18.6 % — ABNORMAL HIGH (ref 11.5–15.5)
WBC: 3.6 10*3/uL — ABNORMAL LOW (ref 4.0–10.5)
nRBC: 0.8 % — ABNORMAL HIGH (ref 0.0–0.2)

## 2020-01-08 LAB — FERRITIN: Ferritin: 749 ng/mL — ABNORMAL HIGH (ref 24–336)

## 2020-01-08 LAB — SAMPLE TO BLOOD BANK

## 2020-01-08 NOTE — Progress Notes (Signed)
No new changes noted today 

## 2020-01-09 ENCOUNTER — Ambulatory Visit (INDEPENDENT_AMBULATORY_CARE_PROVIDER_SITE_OTHER): Payer: Medicare Other | Admitting: Dermatology

## 2020-01-09 DIAGNOSIS — C44229 Squamous cell carcinoma of skin of left ear and external auricular canal: Secondary | ICD-10-CM | POA: Diagnosis not present

## 2020-01-09 DIAGNOSIS — C4492 Squamous cell carcinoma of skin, unspecified: Secondary | ICD-10-CM

## 2020-01-09 MED ORDER — MUPIROCIN 2 % EX OINT: 1.0000 "application " | TOPICAL_OINTMENT | Freq: Every day | CUTANEOUS | 0 refills | Status: AC

## 2020-01-09 NOTE — Progress Notes (Signed)
   Follow-Up Visit   Subjective  Paul Pham. is a 86 y.o. male who presents for the following: Skin Cancer (SCC of left post earlobe - excise today.).    The following portions of the chart were reviewed this encounter and updated as appropriate:  Tobacco  Allergies  Meds  Problems  Med Hx  Surg Hx  Fam Hx      Review of Systems:  No other skin or systemic complaints except as noted in HPI or Assessment and Plan.  Objective  Well appearing patient in no apparent distress; mood and affect are within normal limits.  A focused examination was performed including left ear. Relevant physical exam findings are noted in the Assessment and Plan.  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  Skin excision  Lesion length (cm):  1.3 Lesion width (cm):  1.3 Margin per side (cm):  0.2 Total excision diameter (cm):  1.7 Informed consent: discussed and consent obtained   Timeout: patient name, date of birth, surgical site, and procedure verified   Procedure prep:  Patient was prepped and draped in usual sterile fashion Prep type:  Isopropyl alcohol and povidone-iodine Anesthesia: the lesion was anesthetized in a standard fashion   Anesthetic:  1% lidocaine w/ epinephrine 1-100,000 buffered w/ 8.4% NaHCO3 Instrument used: #15 blade   Hemostasis achieved with: pressure   Hemostasis achieved with comment:  Electrocautery Outcome: patient tolerated procedure well with no complications   Post-procedure details: sterile dressing applied and wound care instructions given   Dressing type: bandage and pressure dressing (mupirocin)   Additional details:  Left to heal by secondary intention.  mupirocin ointment (BACTROBAN) 2 %  Specimen 1 - Surgical pathology Differential Diagnosis: SCC Check Margins: Yes Healing biopsy site [phone] Black ink at 12 oclock sup tip  Return in about 1 week  (around 01/16/2020).   I, Ashok Cordia, CMA, am acting as scribe for Sarina Ser, MD .  Documentation: I have reviewed the above documentation for accuracy and completeness, and I agree with the above.  Sarina Ser, MD

## 2020-01-09 NOTE — Patient Instructions (Signed)

## 2020-01-11 ENCOUNTER — Other Ambulatory Visit: Payer: Self-pay

## 2020-01-11 ENCOUNTER — Other Ambulatory Visit: Payer: Self-pay | Admitting: Hematology and Oncology

## 2020-01-11 ENCOUNTER — Inpatient Hospital Stay: Payer: Medicare Other

## 2020-01-11 ENCOUNTER — Other Ambulatory Visit: Payer: Self-pay | Admitting: *Deleted

## 2020-01-11 DIAGNOSIS — D469 Myelodysplastic syndrome, unspecified: Secondary | ICD-10-CM

## 2020-01-11 DIAGNOSIS — D649 Anemia, unspecified: Secondary | ICD-10-CM

## 2020-01-11 DIAGNOSIS — Z7189 Other specified counseling: Secondary | ICD-10-CM

## 2020-01-11 DIAGNOSIS — D4621 Refractory anemia with excess of blasts 1: Secondary | ICD-10-CM | POA: Diagnosis not present

## 2020-01-11 LAB — CBC WITH DIFFERENTIAL/PLATELET
Abs Immature Granulocytes: 0.03 10*3/uL (ref 0.00–0.07)
Basophils Absolute: 0 10*3/uL (ref 0.0–0.1)
Basophils Relative: 0 %
Eosinophils Absolute: 0.4 10*3/uL (ref 0.0–0.5)
Eosinophils Relative: 13 %
HCT: 23.5 % — ABNORMAL LOW (ref 39.0–52.0)
Hemoglobin: 7.2 g/dL — ABNORMAL LOW (ref 13.0–17.0)
Immature Granulocytes: 1 %
Lymphocytes Relative: 47 %
Lymphs Abs: 1.2 10*3/uL (ref 0.7–4.0)
MCH: 26.1 pg (ref 26.0–34.0)
MCHC: 30.6 g/dL (ref 30.0–36.0)
MCV: 85.1 fL (ref 80.0–100.0)
Monocytes Absolute: 0.3 10*3/uL (ref 0.1–1.0)
Monocytes Relative: 10 %
Neutro Abs: 0.8 10*3/uL — ABNORMAL LOW (ref 1.7–7.7)
Neutrophils Relative %: 29 %
Platelets: 157 10*3/uL (ref 150–400)
RBC: 2.76 MIL/uL — ABNORMAL LOW (ref 4.22–5.81)
RDW: 18.3 % — ABNORMAL HIGH (ref 11.5–15.5)
WBC: 2.6 10*3/uL — ABNORMAL LOW (ref 4.0–10.5)
nRBC: 0 % (ref 0.0–0.2)

## 2020-01-11 LAB — PREPARE RBC (CROSSMATCH)

## 2020-01-12 ENCOUNTER — Inpatient Hospital Stay: Payer: Medicare Other

## 2020-01-12 DIAGNOSIS — D649 Anemia, unspecified: Secondary | ICD-10-CM

## 2020-01-12 DIAGNOSIS — D4621 Refractory anemia with excess of blasts 1: Secondary | ICD-10-CM | POA: Diagnosis not present

## 2020-01-12 DIAGNOSIS — D469 Myelodysplastic syndrome, unspecified: Secondary | ICD-10-CM

## 2020-01-12 MED ORDER — DIPHENHYDRAMINE HCL 25 MG PO CAPS: 25.0000 mg | ORAL_CAPSULE | Freq: Once | ORAL | Status: DC

## 2020-01-12 MED ORDER — SODIUM CHLORIDE 0.9% IV SOLUTION
250.0000 mL | Freq: Once | INTRAVENOUS | Status: AC
  Administered 2020-01-12: 250 mL via INTRAVENOUS
  Filled 2020-01-12: qty 250

## 2020-01-12 MED ORDER — ACETAMINOPHEN 325 MG PO TABS: 650.0000 mg | ORAL_TABLET | Freq: Once | ORAL | Status: DC

## 2020-01-12 NOTE — Patient Instructions (Signed)

## 2020-01-13 LAB — TYPE AND SCREEN
ABO/RH(D): B POS
Antibody Screen: NEGATIVE
Unit division: 0

## 2020-01-13 LAB — BPAM RBC
Blood Product Expiration Date: 202105252359
ISSUE DATE / TIME: 202105141014
Unit Type and Rh: 1700

## 2020-01-16 ENCOUNTER — Ambulatory Visit (INDEPENDENT_AMBULATORY_CARE_PROVIDER_SITE_OTHER): Payer: Medicare Other | Admitting: Dermatology

## 2020-01-16 ENCOUNTER — Other Ambulatory Visit: Payer: Self-pay

## 2020-01-16 DIAGNOSIS — L820 Inflamed seborrheic keratosis: Secondary | ICD-10-CM | POA: Diagnosis not present

## 2020-01-16 DIAGNOSIS — Z86007 Personal history of in-situ neoplasm of skin: Secondary | ICD-10-CM

## 2020-01-16 DIAGNOSIS — L578 Other skin changes due to chronic exposure to nonionizing radiation: Secondary | ICD-10-CM | POA: Diagnosis not present

## 2020-01-16 NOTE — Progress Notes (Signed)
   Follow-Up Visit   Subjective  Paul Pham. is a 86 y.o. male who presents for the following: post op. (patient is here today to recheck SCC of the L post earlobe - healing via secondary intention). He has some spots on his scalp he would like checked.  The following portions of the chart were reviewed this encounter and updated as appropriate:  Tobacco  Allergies  Meds  Problems  Med Hx  Surg Hx  Fam Hx     Review of Systems:  No other skin or systemic complaints except as noted in HPI or Assessment and Plan.  Objective  Well appearing patient in no apparent distress; mood and affect are within normal limits.  A focused examination was performed including L post earlobe. Relevant physical exam findings are noted in the Assessment and Plan.  Objective  L post earlobe: Healing excision site  Objective  Scalp: Erythematous keratotic or waxy stuck-on papule or plaque.    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 Recheck at follow up appointment.   Inflamed seborrheic keratosis Scalp  Destruction of lesion - Scalp Complexity: simple   Destruction method: cryotherapy   Informed consent: discussed and consent obtained   Timeout:  patient name, date of birth, surgical site, and procedure verified Lesion destroyed using liquid nitrogen: Yes   Region frozen until ice ball extended beyond lesion: Yes   Outcome: patient tolerated procedure well with no complications   Post-procedure details: wound care instructions given     Actinic Damage - diffuse scaly erythematous macules with underlying dyspigmentation - Recommend daily broad spectrum sunscreen SPF 30+ to sun-exposed areas, reapply every 2 hours as needed.  - Call for new or changing lesions.  Return in about 3 months (around 04/17/2020).  Luther Redo, CMA, am acting as scribe for Sarina Ser, MD .  Documentation: I have  reviewed the above documentation for accuracy and completeness, and I agree with the above.  Sarina Ser, MD

## 2020-01-17 ENCOUNTER — Encounter: Payer: Self-pay | Admitting: Dermatology

## 2020-01-22 ENCOUNTER — Encounter: Payer: Self-pay | Admitting: Dermatology

## 2020-01-22 ENCOUNTER — Telehealth: Payer: Self-pay

## 2020-01-22 ENCOUNTER — Other Ambulatory Visit: Payer: Self-pay

## 2020-01-22 ENCOUNTER — Other Ambulatory Visit: Payer: Self-pay | Admitting: *Deleted

## 2020-01-22 ENCOUNTER — Inpatient Hospital Stay: Payer: Medicare Other

## 2020-01-22 ENCOUNTER — Other Ambulatory Visit: Payer: Self-pay | Admitting: Hematology and Oncology

## 2020-01-22 DIAGNOSIS — D649 Anemia, unspecified: Secondary | ICD-10-CM

## 2020-01-22 DIAGNOSIS — D469 Myelodysplastic syndrome, unspecified: Secondary | ICD-10-CM

## 2020-01-22 DIAGNOSIS — D4621 Refractory anemia with excess of blasts 1: Secondary | ICD-10-CM | POA: Diagnosis not present

## 2020-01-22 DIAGNOSIS — Z7189 Other specified counseling: Secondary | ICD-10-CM

## 2020-01-22 LAB — CBC WITH DIFFERENTIAL/PLATELET
Abs Immature Granulocytes: 0.03 10*3/uL (ref 0.00–0.07)
Basophils Absolute: 0 10*3/uL (ref 0.0–0.1)
Basophils Relative: 0 %
Eosinophils Absolute: 0 10*3/uL (ref 0.0–0.5)
Eosinophils Relative: 1 %
HCT: 24.2 % — ABNORMAL LOW (ref 39.0–52.0)
Hemoglobin: 7.3 g/dL — ABNORMAL LOW (ref 13.0–17.0)
Immature Granulocytes: 1 %
Lymphocytes Relative: 49 %
Lymphs Abs: 1.1 10*3/uL (ref 0.7–4.0)
MCH: 25.8 pg — ABNORMAL LOW (ref 26.0–34.0)
MCHC: 30.2 g/dL (ref 30.0–36.0)
MCV: 85.5 fL (ref 80.0–100.0)
Monocytes Absolute: 0.2 10*3/uL (ref 0.1–1.0)
Monocytes Relative: 9 %
Neutro Abs: 0.9 10*3/uL — ABNORMAL LOW (ref 1.7–7.7)
Neutrophils Relative %: 40 %
Platelets: 186 10*3/uL (ref 150–400)
RBC: 2.83 MIL/uL — ABNORMAL LOW (ref 4.22–5.81)
RDW: 17.6 % — ABNORMAL HIGH (ref 11.5–15.5)
WBC: 2.4 10*3/uL — ABNORMAL LOW (ref 4.0–10.5)
nRBC: 0 % (ref 0.0–0.2)

## 2020-01-22 LAB — SAMPLE TO BLOOD BANK

## 2020-01-22 LAB — PREPARE RBC (CROSSMATCH)

## 2020-01-22 NOTE — Telephone Encounter (Signed)
Informed patient's wife of patient's HGB. Reminded of patient's blood transfusion that is scheduled for tomorrow. Wife verbalizes understanding and states he will attend appt. Denies any further questions or concerns.

## 2020-01-22 NOTE — Telephone Encounter (Signed)
-----   Message from Lequita Asal, MD sent at 01/22/2020 12:37 PM EDT ----- Regarding: Please call patient  I suspect he may need a transfusion this week.  Please call patient and schedule.  M ----- Message ----- From: Buel Ream, Lab In [place] Sent: 01/22/2020   9:57 AM EDT To: Lequita Asal, MD

## 2020-01-23 ENCOUNTER — Inpatient Hospital Stay: Payer: Medicare Other

## 2020-01-23 ENCOUNTER — Other Ambulatory Visit: Payer: Self-pay | Admitting: *Deleted

## 2020-01-23 DIAGNOSIS — D469 Myelodysplastic syndrome, unspecified: Secondary | ICD-10-CM

## 2020-01-23 DIAGNOSIS — D649 Anemia, unspecified: Secondary | ICD-10-CM

## 2020-01-23 DIAGNOSIS — D4621 Refractory anemia with excess of blasts 1: Secondary | ICD-10-CM | POA: Diagnosis not present

## 2020-01-23 MED ORDER — SODIUM CHLORIDE 0.9% IV SOLUTION
250.0000 mL | Freq: Once | INTRAVENOUS | Status: DC
  Filled 2020-01-23: qty 250

## 2020-01-23 MED ORDER — DIPHENHYDRAMINE HCL 25 MG PO CAPS: 25.0000 mg | ORAL_CAPSULE | Freq: Once | ORAL | Status: DC

## 2020-01-23 MED ORDER — ACETAMINOPHEN 325 MG PO TABS: 650.0000 mg | ORAL_TABLET | Freq: Once | ORAL | Status: DC

## 2020-01-24 ENCOUNTER — Other Ambulatory Visit: Payer: Self-pay | Admitting: *Deleted

## 2020-01-24 DIAGNOSIS — D469 Myelodysplastic syndrome, unspecified: Secondary | ICD-10-CM

## 2020-01-24 LAB — TYPE AND SCREEN
ABO/RH(D): B POS
Antibody Screen: NEGATIVE
Unit division: 0

## 2020-01-24 LAB — BPAM RBC
Blood Product Expiration Date: 202106042359
ISSUE DATE / TIME: 202105251101
Unit Type and Rh: 7300

## 2020-01-30 ENCOUNTER — Other Ambulatory Visit: Payer: Self-pay

## 2020-01-30 ENCOUNTER — Inpatient Hospital Stay: Payer: Medicare Other | Attending: Hematology and Oncology

## 2020-01-30 ENCOUNTER — Telehealth: Payer: Self-pay

## 2020-01-30 ENCOUNTER — Other Ambulatory Visit: Payer: Self-pay | Admitting: Hematology and Oncology

## 2020-01-30 DIAGNOSIS — D469 Myelodysplastic syndrome, unspecified: Secondary | ICD-10-CM

## 2020-01-30 DIAGNOSIS — D4621 Refractory anemia with excess of blasts 1: Secondary | ICD-10-CM | POA: Diagnosis present

## 2020-01-30 DIAGNOSIS — D649 Anemia, unspecified: Secondary | ICD-10-CM

## 2020-01-30 DIAGNOSIS — Z7189 Other specified counseling: Secondary | ICD-10-CM

## 2020-01-30 LAB — CBC WITH DIFFERENTIAL/PLATELET
Abs Immature Granulocytes: 0.04 10*3/uL (ref 0.00–0.07)
Basophils Absolute: 0 10*3/uL (ref 0.0–0.1)
Basophils Relative: 0 %
Eosinophils Absolute: 0 10*3/uL (ref 0.0–0.5)
Eosinophils Relative: 2 %
HCT: 24.5 % — ABNORMAL LOW (ref 39.0–52.0)
Hemoglobin: 7.5 g/dL — ABNORMAL LOW (ref 13.0–17.0)
Immature Granulocytes: 2 %
Lymphocytes Relative: 53 %
Lymphs Abs: 1.1 10*3/uL (ref 0.7–4.0)
MCH: 26.2 pg (ref 26.0–34.0)
MCHC: 30.6 g/dL (ref 30.0–36.0)
MCV: 85.7 fL (ref 80.0–100.0)
Monocytes Absolute: 0.2 10*3/uL (ref 0.1–1.0)
Monocytes Relative: 8 %
Neutro Abs: 0.7 10*3/uL — ABNORMAL LOW (ref 1.7–7.7)
Neutrophils Relative %: 35 %
Platelets: 140 10*3/uL — ABNORMAL LOW (ref 150–400)
RBC: 2.86 MIL/uL — ABNORMAL LOW (ref 4.22–5.81)
RDW: 17.5 % — ABNORMAL HIGH (ref 11.5–15.5)
WBC: 2 10*3/uL — ABNORMAL LOW (ref 4.0–10.5)
nRBC: 0 % (ref 0.0–0.2)

## 2020-01-30 LAB — SAMPLE TO BLOOD BANK

## 2020-01-30 NOTE — Telephone Encounter (Signed)
-----   Message from Lequita Asal, MD sent at 01/30/2020 10:34 AM EDT ----- Regarding: Please call patient  Hemoglobin is 7.5.  Does  he need to be set up for a transfusion?  Did he go to [facility] to discuss possible future treatment options or supportive care?  M ----- Message ----- From: Buel Ream, Lab In [place] Sent: 01/30/2020   9:37 AM EDT To: Lequita Asal, MD

## 2020-01-30 NOTE — Telephone Encounter (Signed)
Yes he will come in for the transfusion tomorrow, he already has an appointment scheduled. He goes to [facility] on the 9th and sees Dr Mike Gip again on the 10th.

## 2020-01-31 ENCOUNTER — Inpatient Hospital Stay: Payer: Medicare Other

## 2020-01-31 ENCOUNTER — Other Ambulatory Visit: Payer: Self-pay

## 2020-01-31 DIAGNOSIS — D649 Anemia, unspecified: Secondary | ICD-10-CM

## 2020-01-31 DIAGNOSIS — D4621 Refractory anemia with excess of blasts 1: Secondary | ICD-10-CM | POA: Diagnosis not present

## 2020-01-31 DIAGNOSIS — D469 Myelodysplastic syndrome, unspecified: Secondary | ICD-10-CM

## 2020-01-31 LAB — PREPARE RBC (CROSSMATCH)

## 2020-01-31 MED ORDER — ACETAMINOPHEN 325 MG PO TABS: 650.0000 mg | ORAL_TABLET | Freq: Once | ORAL | Status: DC

## 2020-01-31 MED ORDER — DIPHENHYDRAMINE HCL 25 MG PO CAPS: 25.0000 mg | ORAL_CAPSULE | Freq: Once | ORAL | Status: DC

## 2020-01-31 MED ORDER — SODIUM CHLORIDE 0.9% IV SOLUTION
250.0000 mL | Freq: Once | INTRAVENOUS | Status: AC
  Administered 2020-01-31: 250 mL via INTRAVENOUS
  Filled 2020-01-31: qty 250

## 2020-02-01 LAB — TYPE AND SCREEN
ABO/RH(D): B POS
Antibody Screen: NEGATIVE
Unit division: 0

## 2020-02-01 LAB — BPAM RBC
Blood Product Expiration Date: 202106072359
ISSUE DATE / TIME: 202106021016
Unit Type and Rh: 5100

## 2020-02-07 NOTE — Progress Notes (Signed)
[facility]  [address] [place], [place] [address] Phone: [phone]  Fax: [phone]   Clinic Day:  02/08/2020  Referring physician: Tracie Harrier, MD  Chief Complaint: Paul Sachs. is a 86 y.o. male with RAEB-1 who is seen for a 1 month assessment.   HPI: The patient was last seen in the hematology clinic on 01/08/2020. At that time, he felt "fair".  He felt better after transfusions.  Hematocrit 25.3, hemoglobin 7.6, platelets 185,000, WBC 3,600 (ANC 1,400). Ferritin was 749.   Patient underwent left earlobe skin excision with Dr. Nehemiah Massed on 01/09/2020. Pathology revealed residual squamous cell carcinoma, with free margins.   CBC followed: 01/11/2020: Hematocrit 23.5, hemoglobin 7.2, platelets 157,000, WBC 2,600 (ANC 800).  01/22/2020: Hematocrit 24.2, hemoglobin 7.3, platelets 186,000, WBC 2,400 (ANC 900).  01/30/2020: Hematocrit 24.5, hemoglobin 7.5, platelets 140,000, WBC 2,000 (ANC 700).   He received PRBCs on 01/12/2020, 01/23/2020, and 01/31/2020.  He was scheduled to see Dr Katina Dung on 02/07/2020.  He recommended a bone marrow aspirate and biopsy at [facility].  Treatment options included lenalidomide (if blast count less than 10), azacitidine and venetoclax (if blast count greater than 10%) or clinical trial.   During the interim, he was doing well. He reports no symptoms. He is keeping active and does a few chores around the home and yard work. He will have a bone marrow on 02/15/2020. Patient may become a part of a clinical trial at [facility] or [facility] since [facility] does not have any to offer at this time. Patient is fine with going to [facility] or [facility] for a clinical trial. He would like treatment that yields the best response. His wife would like to have his treatment at this clinic. I noted that if it is a clinical trial he may not be able to come here. I educated the patient and his family on the risk and benefits of Revlimid.   He agreed to  a transfusion next Tuesday prior to bone marrow.    Past Medical History:  Diagnosis Date  . Anemia   . Arthritis   . BPH (benign prostatic hypertrophy)   . Cancer ([place])   . Complication of anesthesia    nausea  . Diabetes type 2, controlled ([place])   . HOH (hard of hearing)    r and L ears-70% loss per pt  . Hyperlipidemia   . Hypertension   . Myelodysplasia (myelodysplastic syndrome) ([place]) 2019    Past Surgical History:  Procedure Laterality Date  . APPENDECTOMY    . COLONOSCOPY  09/21/08   1 polyp found, tubular adenoma  . HAND SURGERY    . KNEE SURGERY Left     Family History  Problem Relation Age of Onset  . Aneurysm Mother   . Heart disease Father   . Cancer Brother        skin  . Heart disease Brother   . Heart disease Brother   . Cancer Sister        skin  . Aneurysm Brother   . Heart disease Brother   . Heart disease Sister   . Heart disease Sister   . COPD Sister   . Diabetes Sister     Social History:  reports that he has never smoked. He has never used smokeless tobacco. He reports that he does not drink alcohol and does not use drugs. 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 Patient denies known exposures to radiation on toxins. 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 He had his 53-year marriage anniversary on 08/23/2018.He is walking 1-76mles per day.[place]. The patient is accompanied by his daughter and wife on the iPad today.  Allergies: No Known Allergies  Current Medications: Current Outpatient Medications  Medication Sig Dispense Refill  . aspirin 81 MG chewable tablet Chew 81 mg by mouth daily.    . Cranberry (THERACRAN PO) Take 1 tablet by mouth daily.     . cyanocobalamin 1000 MCG tablet Take 1,000 mcg by mouth daily.    . finasteride (PROSCAR) 5 MG tablet Take 5 mg by mouth daily.    .Marland Kitchenglucose blood test strip FreeStyle Lite Strips    . LANCETS ULTRA FINE MISC  Lancets,Ultra Thin 26 gauge    . lisinopril (PRINIVIL,ZESTRIL) 10 MG tablet Take 10 mg by mouth daily.    .Marland Kitchenloperamide (IMODIUM) 2 MG capsule Take 1 capsule (2 mg total) by mouth See admin instructions. With onset of loose stool, take 419mfollowed by 104m77mvery 2 hours until loose bowel movement stopped. Maximum: 16 mg/day 30 capsule 0  . meclizine (ANTIVERT) 25 MG tablet Take 25 mg by mouth 2 (two) times daily as needed.     . metformin (FORTAMET) 1000 MG (OSM) 24 hr tablet Take 1,000 mg by mouth 2 (two) times daily with a meal.    . Multiple Vitamins-Minerals (CENTRUM SILVER PO) Take by mouth.    . mupirocin ointment (BACTROBAN) 2 % Apply 1 application topically daily. With dressing changes 22 g 0  . Omega-3 Fatty Acids (FISH OIL PO) Take 1 tablet by mouth daily.     . ondansetron (ZOFRAN) 4 MG tablet Take 1 tablet (4 mg total) by mouth every 6 (six) hours as needed for nausea or vomiting. 30 tablet 2  . Polyethylene Glycol 3350 (MIRALAX PO) Take by mouth as needed.     . SClarnce Flocklmetto-Phytosterols (PROSTATE SR PO) Take 1 tablet by mouth daily.     . simvastatin (ZOCOR) 40 MG tablet Take 40 mg by mouth daily.    . gMarland Kitchenimepiride (AMARYL) 1 MG tablet Take 1 tablet by mouth daily after breakfast.     No current facility-administered medications for this visit.   Facility-Administered Medications Ordered in Other Visits  Medication Dose Route Frequency Provider Last Rate Last Admin  . acetaminophen (TYLENOL) tablet 650 mg  650 mg Oral Once CorNolon Stalls MD      . acetaminophen (TYLENOL) tablet 650 mg  650 mg Oral Once CorNolon Stalls MD      . diphenhydrAMINE (BENADRYL) capsule 25 mg  25 mg Oral Once CorNolon Stalls MD      . diphenhydrAMINE (BENADRYL) capsule 25 mg  25 mg Oral Once CorLequita AsalD        Review of Systems  Constitutional: Negative for chills, diaphoresis, fever, malaise/fatigue and weight loss (stable).       Doing well. No complaints. Active.  HENT:  Positive for hearing loss. Negative for congestion, ear discharge, ear pain, nosebleeds, sinus pain, sore throat and tinnitus.   Eyes: Negative for blurred vision.  Respiratory: Negative for cough, hemoptysis, sputum production and shortness of breath.   Cardiovascular: Negative for chest pain, palpitations and leg swelling.  Gastrointestinal: Negative for abdominal pain, blood in stool, constipation (well managed with MriaLax), diarrhea, heartburn, melena, nausea and vomiting.  Genitourinary: Negative for dysuria, frequency, hematuria and urgency.  Musculoskeletal: Negative for back pain, joint pain, myalgias and neck pain.  Skin: Negative for itching and rash.  Neurological: Negative for dizziness, tingling, sensory change, weakness and headaches.  Endo/Heme/Allergies: Does not bruise/bleed easily.       Diabetes.  Psychiatric/Behavioral: Negative for depression and memory loss. The patient is not nervous/anxious and does not have insomnia.   All other systems reviewed and are negative.  Performance status (ECOG): 0-1  Vitals Blood pressure (!) 134/53, pulse 74, temperature (!) 97.5 F (36.4 C), temperature source Tympanic, weight 176 lb 2.4 oz (79.9 kg), SpO2 100 %.   Physical Exam Vitals and nursing note reviewed.  Constitutional:      General: He is not in acute distress.    Appearance: He is well-developed and well-nourished. He is not diaphoretic.  HENT:     Head: Normocephalic and atraumatic.     Mouth/Throat:     Mouth: Oropharynx is clear and moist.     Pharynx: No oropharyngeal exudate.      Comments: Sparse short gray hair.  Near alopecia/male pattern baldness.  Mask.  Hearing aid. Eyes:     General: No scleral icterus.    Extraocular Movements: EOM normal.     Conjunctiva/sclera: Conjunctivae normal.     Pupils: Pupils are equal, round, and reactive to light.     Comments: Gold rimmed glasses.  Blue eyes.   Cardiovascular:     Rate and Rhythm: Normal rate and  regular rhythm.     Heart sounds: Normal heart sounds. No murmur heard.   Pulmonary:     Effort: Pulmonary effort is normal. No respiratory distress.     Breath sounds: Normal breath sounds. No wheezing or rales.  Chest:     Chest wall: No tenderness.  Abdominal:     General: Bowel sounds are normal. There is no distension.     Palpations: Abdomen is soft. There is no mass.     Tenderness: There is no abdominal tenderness. There is no guarding or rebound.  Musculoskeletal:        General: No tenderness or edema. Normal range of motion.     Cervical back: Normal range of motion and neck supple.  Lymphadenopathy:     Head:     Right side of head: No preauricular, posterior auricular or occipital adenopathy.     Left side of head: No preauricular, posterior auricular or occipital adenopathy.     Cervical: No cervical adenopathy.     Upper Body:  No axillary adenopathy present.    Right upper body: No supraclavicular adenopathy.     Left upper body: No supraclavicular adenopathy.     Lower Body: No right inguinal adenopathy. No left inguinal adenopathy.  Skin:    General: Skin is warm and dry.  Neurological:     Mental Status: He is alert and oriented to person, place, and time.  Psychiatric:        Mood and Affect: Mood and affect normal.        Behavior: Behavior normal.        Thought Content: Thought content normal.        Judgment: Judgment normal.    Appointment on 02/08/2020  Component Date Value Ref Range Status  . Sodium 02/08/2020 136  135 - 145 mmol/L Final  . Potassium 02/08/2020 4.4  3.5 - 5.1 mmol/L Final  . Chloride 02/08/2020 103  98 - 111 mmol/L Final  . CO2 02/08/2020 25  22 - 32 mmol/L Final  .  Glucose, Bld 02/08/2020 195* 70 - 99 mg/dL Final   Glucose reference range applies only to samples taken after fasting for at least 8 hours.  . BUN 02/08/2020 22  8 - 23 mg/dL Final  . Creatinine, Ser 02/08/2020 0.99  0.61 - 1.24 mg/dL Final  . Calcium 02/08/2020  8.7* 8.9 - 10.3 mg/dL Final  . Total Protein 02/08/2020 7.5  6.5 - 8.1 g/dL Final  . Albumin 02/08/2020 4.0  3.5 - 5.0 g/dL Final  . AST 02/08/2020 21  15 - 41 U/L Final  . ALT 02/08/2020 18  0 - 44 U/L Final  . Alkaline Phosphatase 02/08/2020 31* 38 - 126 U/L Final  . Total Bilirubin 02/08/2020 0.5  0.3 - 1.2 mg/dL Final  . GFR calc non Af Amer 02/08/2020 >60  >60 mL/min Final  . GFR calc Af Amer 02/08/2020 >60  >60 mL/min Final  . Anion gap 02/08/2020 8  5 - 15 Final   Performed at [facility], [address], [place], [place] [address]  . WBC 02/08/2020 2.3* 4.0 - 10.5 K/uL Final  . RBC 02/08/2020 2.95* 4.22 - 5.81 MIL/uL Final  . Hemoglobin 02/08/2020 7.8* 13.0 - 17.0 g/dL Final  . HCT 02/08/2020 25.2* 39 - 52 % Final  . MCV 02/08/2020 85.4  80.0 - 100.0 fL Final  . MCH 02/08/2020 26.4  26.0 - 34.0 pg Final  . MCHC 02/08/2020 31.0  30.0 - 36.0 g/dL Final  . RDW 02/08/2020 17.2* 11.5 - 15.5 % Final  . Platelets 02/08/2020 134* 150 - 400 K/uL Final  . nRBC 02/08/2020 0.0  0.0 - 0.2 % Final  . Neutrophils Relative % 02/08/2020 37  % Final  . Neutro Abs 02/08/2020 0.9* 1.7 - 7.7 K/uL Final  . Lymphocytes Relative 02/08/2020 51  % Final  . Lymphs Abs 02/08/2020 1.2  0.7 - 4.0 K/uL Final  . Monocytes Relative 02/08/2020 9  % Final  . Monocytes Absolute 02/08/2020 0.2  0 - 1 K/uL Final  . Eosinophils Relative 02/08/2020 1  % Final  . Eosinophils Absolute 02/08/2020 0.0  0 - 0 K/uL Final  . Basophils Relative 02/08/2020 0  % Final  . Basophils Absolute 02/08/2020 0.0  0 - 0 K/uL Final  . Immature Granulocytes 02/08/2020 2  % Final  . Abs Immature Granulocytes 02/08/2020 0.04  0.00 - 0.07 K/uL Final   Performed at [facility], [address] [place], [place] [address]    Assessment:  Paul Bullinger. is a 86 y.o. 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 Bone marrowon 09/22/2017 revealed a hypercellular for age with dyspoietic changes variably involving myeloid cell lines, but  with main involvement of the granulocytic/monocytic cell line. This was associated with bone marrow monocytosis and borderline number to slight increase in blastic cells. The overall changes favor a primary myeloid neoplasm, particularly a myelodysplastic syndrome especially refractory anemia with excess blasts (RAEB-1) or possibly refractory cytopenia with multilineage dysplasia. Consideration was also given to an evolving myelodysplastic/myeloproliferative neoplasm such as chronic myelomonocytic leukemia but the lack of absolute peripheral monocytosis precluded such a diagnosis at this time. Flow cytometrywas negative. Cytogeneticswere normal (46, XY). Foundation one was positive for ASXL1 G283MO*29, EZH2 Splice site 476-5Y>Y, RUNX1 [id number]. IPSS-R4.5, intermediate group.  Anemia work-up on 08/17/2017: Vitamin B12 (658), folate (22.0), and TSH (2.688). Retic was 2.6%. Haptoglobin was 116 on 08/19/2017. Epo levelwas 104.8 on 10/05/2017.  Ferritin has been followed: 106 on 08/17/2017, 131 on 11/04/2017, 144 on 05/30/2018, 394  on 09/01/2018, 441 on 11/28/2018, 394 on 01/02/2019, 480 on 02/06/2019, 392 on 03/13/2019, 527 on 04/17/2019, 533 on 05/29/2019, 476 on 06/05/2019, 517 on 07/17/2019, 685 on 08/28/2019, and 724 on 10/16/2019. Iron saturation was 34% on 08/17/2017 and 89% on 09/01/2018.  Bone marrowon 09/22/2017 revealed a hypercellular for age with dyspoietic changes variably involving myeloid cell lines, but with main involvement of the granulocytic/monocytic cell line. This was associated with bone marrow monocytosis and borderline number to slight increase in blasts (5%). The overall changes favor a primary myeloid neoplasm, particularly a myelodysplastic syndrome especially refractory anemia with excess blasts (RAEB-1) or possibly refractory cytopenia with multilineage dysplasia. Consideration was also given to an evolving myelodysplastic/myeloproliferative neoplasm such as chronic  myelomonocytic leukemia but the lack of absolute peripheral monocytosis precluded such a diagnosis at this time. Flow cytometrywas negative. Cytogeneticswere normal (46, XY). Foundation One was positive for ASXL1 X528UX*32, EZH2 Splice site 440-1U>U, RUNX1 [id number].  He has received20cycles ofVidaza(11/04/2017 - 08/22/2018; 10/24/2018-10/23/2019). He has received Aranesp(initially 150 mcg every 2 weeks post chemotherapy then 300 mcg every 1-2 weeks after cycle #3; last 09/19/2018). He last received Aranesp on02/15/2021. He has received 1-2units of PRBCswith each cycle (last04/26/2021).He has received34units of PRBCs since 08/17/2017 (last 01/31/2020).  Bone marrowon 08/12/2018 revealed a hypercellular marrow with dyspoietic changes. There was a borderline number of blasts (5%) seen by morphology. The findings were similar to previous biopsy although the cellularity is slightly higher in the current material.consistent with previously known myelodysplastic syndrome. Flow cytometry was negative. Cytogenetics were normal (46, XY).  Bone marrowon 11/27/2019 revealed a hypercellular bone marrow (60-80%) with myelodysplastic syndrome. The morphologic features were similar to the previous biopsy. There was no definite increase in blasts. Flow cytometry revealed no significant CD34-positive blastic population, increase in monocytic cells (up to 18%), Tcells with nonspecific phenotypic changes, and no monoclonal B-cell population identified.Cytogenetics were normal (46, XY). FISH was negative. Next-Gen myeloid disorder profilerevealsASXL1G658f*15,EZH2splice site 1725-3G>U,YQIHKVsite cQ.259-5GLO splice site cV.564-3_329-5JOACZYSAY,TKZS0F093A*35 There were no abnormalities in FLT3, IDH1, IDH2, NPM1.  HereceivedbothPfizervaccinesfor COVID-19(firston01/01/2020).  Symptomatically, he is doing well.  He is able to do chores around his house.  Exam is  stable.  Plan: 1.   Labs today: CBC with diff, CMP, ferritin, hold tube. 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 Treatmentgoalhas been todecrease transfusion dependence and prevent acceleration of disease (blasts). Bone marrow on 11/27/2019 was similar to 08/12/2018. There was no increase in blasts. Monocyte count is 18%. Next-Gen myeloid disorder profile [id number],EZH2splice site 11573-2K>G,URKYHCite c.W.237-6EGBsplice site c.T.517-6_160-7PXTGGYIRS,WNIO2V035K*09There were no abnormalities in FLT3, IDH1, IDH2, NPM1. Review interval consultation with Dr. VaKatina Dung  Await follow-up of bone marrow aspirate and biopsy.   Discuss treatment options based on blast count.   Discuss clinical trial enrollment. 3. Iron overload [phone]. He has received34units of PRBCs to date (08/17/2017 -01/31/2020). Continue to postpone Jadenu secondary to issues with leukopenia. 4.RTC on 06/14 for labs (CBC with diff, type and screen). 5.   RTC on 06/15 for 1 unit of PRBCs. 6.   RTC on 06/23 for labs (CBC with diff, hold tube). 7.   RTC in 1 month for MD assessment, labs (CBC with diff, CMP, ferritin, hold tube), and discussion regarding direction of therapy.  I discussed the assessment and treatment plan with the patient.  The patient was provided an opportunity to ask questions and all were answered.  The patient agreed with the plan and demonstrated an understanding  of the instructions.  The patient was advised to call back if the symptoms worsen or if the condition fails to improve as anticipated.   Lequita Asal, MD, PhD    02/08/2020, 10:47 AM  I, Selena Batten, am acting as scribe for [employer].  Mike Gip, MD, PhD.  I, Aislinn Feliz C. Mike Gip, MD, have reviewed the above documentation for accuracy and completeness, and I agree with the above.

## 2020-02-08 ENCOUNTER — Inpatient Hospital Stay (HOSPITAL_BASED_OUTPATIENT_CLINIC_OR_DEPARTMENT_OTHER): Payer: Medicare Other | Admitting: Hematology and Oncology

## 2020-02-08 ENCOUNTER — Other Ambulatory Visit: Payer: Self-pay

## 2020-02-08 ENCOUNTER — Encounter: Payer: Self-pay | Admitting: Hematology and Oncology

## 2020-02-08 ENCOUNTER — Inpatient Hospital Stay: Payer: Medicare Other

## 2020-02-08 VITALS — BP 134/53 | HR 74 | Temp 97.5°F | Wt 176.1 lb

## 2020-02-08 DIAGNOSIS — Z7189 Other specified counseling: Secondary | ICD-10-CM | POA: Diagnosis not present

## 2020-02-08 DIAGNOSIS — D4621 Refractory anemia with excess of blasts 1: Secondary | ICD-10-CM | POA: Diagnosis not present

## 2020-02-08 DIAGNOSIS — D469 Myelodysplastic syndrome, unspecified: Secondary | ICD-10-CM

## 2020-02-08 LAB — COMPREHENSIVE METABOLIC PANEL
ALT: 18 U/L (ref 0–44)
AST: 21 U/L (ref 15–41)
Albumin: 4 g/dL (ref 3.5–5.0)
Alkaline Phosphatase: 31 U/L — ABNORMAL LOW (ref 38–126)
Anion gap: 8 (ref 5–15)
BUN: 22 mg/dL (ref 8–23)
CO2: 25 mmol/L (ref 22–32)
Calcium: 8.7 mg/dL — ABNORMAL LOW (ref 8.9–10.3)
Chloride: 103 mmol/L (ref 98–111)
Creatinine, Ser: 0.99 mg/dL (ref 0.61–1.24)
GFR calc Af Amer: >60 mL/min (ref >60)
GFR calc non Af Amer: >60 mL/min (ref >60)
Glucose, Bld: 195 mg/dL — ABNORMAL HIGH (ref 70–99)
Potassium: 4.4 mmol/L (ref 3.5–5.1)
Sodium: 136 mmol/L (ref 135–145)
Total Bilirubin: 0.5 mg/dL (ref 0.3–1.2)
Total Protein: 7.5 g/dL (ref 6.5–8.1)

## 2020-02-08 LAB — CBC WITH DIFFERENTIAL/PLATELET
Abs Immature Granulocytes: 0.04 10*3/uL (ref 0.00–0.07)
Basophils Absolute: 0 10*3/uL (ref 0.0–0.1)
Basophils Relative: 0 %
Eosinophils Absolute: 0 10*3/uL (ref 0.0–0.5)
Eosinophils Relative: 1 %
HCT: 25.2 % — ABNORMAL LOW (ref 39.0–52.0)
Hemoglobin: 7.8 g/dL — ABNORMAL LOW (ref 13.0–17.0)
Immature Granulocytes: 2 %
Lymphocytes Relative: 51 %
Lymphs Abs: 1.2 10*3/uL (ref 0.7–4.0)
MCH: 26.4 pg (ref 26.0–34.0)
MCHC: 31 g/dL (ref 30.0–36.0)
MCV: 85.4 fL (ref 80.0–100.0)
Monocytes Absolute: 0.2 10*3/uL (ref 0.1–1.0)
Monocytes Relative: 9 %
Neutro Abs: 0.9 10*3/uL — ABNORMAL LOW (ref 1.7–7.7)
Neutrophils Relative %: 37 %
Platelets: 134 10*3/uL — ABNORMAL LOW (ref 150–400)
RBC: 2.95 MIL/uL — ABNORMAL LOW (ref 4.22–5.81)
RDW: 17.2 % — ABNORMAL HIGH (ref 11.5–15.5)
WBC: 2.3 10*3/uL — ABNORMAL LOW (ref 4.0–10.5)
nRBC: 0 % (ref 0.0–0.2)

## 2020-02-08 LAB — FERRITIN: Ferritin: 985 ng/mL — ABNORMAL HIGH (ref 24–336)

## 2020-02-08 LAB — SAMPLE TO BLOOD BANK

## 2020-02-12 ENCOUNTER — Other Ambulatory Visit: Payer: Self-pay | Admitting: Hematology and Oncology

## 2020-02-12 ENCOUNTER — Other Ambulatory Visit: Payer: Self-pay

## 2020-02-12 ENCOUNTER — Inpatient Hospital Stay: Payer: Medicare Other

## 2020-02-12 ENCOUNTER — Other Ambulatory Visit: Payer: Self-pay | Admitting: *Deleted

## 2020-02-12 DIAGNOSIS — Z7189 Other specified counseling: Secondary | ICD-10-CM

## 2020-02-12 DIAGNOSIS — D469 Myelodysplastic syndrome, unspecified: Secondary | ICD-10-CM

## 2020-02-12 DIAGNOSIS — D4621 Refractory anemia with excess of blasts 1: Secondary | ICD-10-CM | POA: Diagnosis not present

## 2020-02-12 DIAGNOSIS — D649 Anemia, unspecified: Secondary | ICD-10-CM

## 2020-02-12 LAB — CBC WITH DIFFERENTIAL/PLATELET
Abs Immature Granulocytes: 0.01 10*3/uL (ref 0.00–0.07)
Basophils Absolute: 0 10*3/uL (ref 0.0–0.1)
Basophils Relative: 0 %
Eosinophils Absolute: 0 10*3/uL (ref 0.0–0.5)
Eosinophils Relative: 1 %
HCT: 23.4 % — ABNORMAL LOW (ref 39.0–52.0)
Hemoglobin: 7.5 g/dL — ABNORMAL LOW (ref 13.0–17.0)
Immature Granulocytes: 1 %
Lymphocytes Relative: 52 %
Lymphs Abs: 0.8 10*3/uL (ref 0.7–4.0)
MCH: 27.2 pg (ref 26.0–34.0)
MCHC: 32.1 g/dL (ref 30.0–36.0)
MCV: 84.8 fL (ref 80.0–100.0)
Monocytes Absolute: 0.2 10*3/uL (ref 0.1–1.0)
Monocytes Relative: 10 %
Neutro Abs: 0.6 10*3/uL — ABNORMAL LOW (ref 1.7–7.7)
Neutrophils Relative %: 36 %
Platelets: 136 10*3/uL — ABNORMAL LOW (ref 150–400)
RBC: 2.76 MIL/uL — ABNORMAL LOW (ref 4.22–5.81)
RDW: 17.6 % — ABNORMAL HIGH (ref 11.5–15.5)
WBC: 1.6 10*3/uL — ABNORMAL LOW (ref 4.0–10.5)
nRBC: 0 % (ref 0.0–0.2)

## 2020-02-12 LAB — PREPARE RBC (CROSSMATCH)

## 2020-02-12 LAB — SAMPLE TO BLOOD BANK

## 2020-02-13 ENCOUNTER — Inpatient Hospital Stay: Payer: Medicare Other

## 2020-02-13 ENCOUNTER — Other Ambulatory Visit: Payer: Self-pay | Admitting: *Deleted

## 2020-02-13 DIAGNOSIS — D469 Myelodysplastic syndrome, unspecified: Secondary | ICD-10-CM

## 2020-02-13 DIAGNOSIS — D4621 Refractory anemia with excess of blasts 1: Secondary | ICD-10-CM | POA: Diagnosis not present

## 2020-02-13 DIAGNOSIS — D649 Anemia, unspecified: Secondary | ICD-10-CM

## 2020-02-13 MED ORDER — ACETAMINOPHEN 325 MG PO TABS: 650.0000 mg | ORAL_TABLET | Freq: Once | ORAL | Status: AC

## 2020-02-13 MED ORDER — DIPHENHYDRAMINE HCL 25 MG PO CAPS: 25.0000 mg | ORAL_CAPSULE | Freq: Once | ORAL | Status: AC

## 2020-02-13 MED ORDER — SODIUM CHLORIDE 0.9% IV SOLUTION
250.0000 mL | Freq: Once | INTRAVENOUS | Status: AC
  Administered 2020-02-13: 250 mL via INTRAVENOUS
  Filled 2020-02-13: qty 250

## 2020-02-14 LAB — TYPE AND SCREEN
ABO/RH(D): B POS
Antibody Screen: NEGATIVE
Unit division: 0

## 2020-02-14 LAB — BPAM RBC
Blood Product Expiration Date: 202107052359
ISSUE DATE / TIME: 202106151326
Unit Type and Rh: 7300

## 2020-02-21 ENCOUNTER — Other Ambulatory Visit: Payer: Self-pay | Admitting: Hematology and Oncology

## 2020-02-21 ENCOUNTER — Other Ambulatory Visit: Payer: Self-pay

## 2020-02-21 ENCOUNTER — Telehealth: Payer: Self-pay

## 2020-02-21 ENCOUNTER — Inpatient Hospital Stay: Payer: Medicare Other

## 2020-02-21 DIAGNOSIS — Z7189 Other specified counseling: Secondary | ICD-10-CM

## 2020-02-21 DIAGNOSIS — D649 Anemia, unspecified: Secondary | ICD-10-CM

## 2020-02-21 DIAGNOSIS — D4621 Refractory anemia with excess of blasts 1: Secondary | ICD-10-CM | POA: Diagnosis not present

## 2020-02-21 DIAGNOSIS — D469 Myelodysplastic syndrome, unspecified: Secondary | ICD-10-CM

## 2020-02-21 LAB — CBC WITH DIFFERENTIAL/PLATELET
Abs Immature Granulocytes: 0.02 10*3/uL (ref 0.00–0.07)
Basophils Absolute: 0 10*3/uL (ref 0.0–0.1)
Basophils Relative: 0 %
Eosinophils Absolute: 0 10*3/uL (ref 0.0–0.5)
Eosinophils Relative: 1 %
HCT: 22.8 % — ABNORMAL LOW (ref 39.0–52.0)
Hemoglobin: 7.1 g/dL — ABNORMAL LOW (ref 13.0–17.0)
Immature Granulocytes: 1 %
Lymphocytes Relative: 57 %
Lymphs Abs: 1.1 10*3/uL (ref 0.7–4.0)
MCH: 26.5 pg (ref 26.0–34.0)
MCHC: 31.1 g/dL (ref 30.0–36.0)
MCV: 85.1 fL (ref 80.0–100.0)
Monocytes Absolute: 0.2 10*3/uL (ref 0.1–1.0)
Monocytes Relative: 10 %
Neutro Abs: 0.6 10*3/uL — ABNORMAL LOW (ref 1.7–7.7)
Neutrophils Relative %: 31 %
Platelets: 121 10*3/uL — ABNORMAL LOW (ref 150–400)
RBC: 2.68 MIL/uL — ABNORMAL LOW (ref 4.22–5.81)
RDW: 17.2 % — ABNORMAL HIGH (ref 11.5–15.5)
WBC: 1.8 10*3/uL — ABNORMAL LOW (ref 4.0–10.5)
nRBC: 0 % (ref 0.0–0.2)

## 2020-02-21 LAB — SAMPLE TO BLOOD BANK

## 2020-02-21 LAB — PREPARE RBC (CROSSMATCH)

## 2020-02-21 NOTE — Telephone Encounter (Signed)
Patients wife states that he feels like he needs one. Dr C, infusion, and scheduling made aware.

## 2020-02-21 NOTE — Telephone Encounter (Signed)
-----   Message from Lequita Asal, MD sent at 02/21/2020  1:17 PM EDT ----- Regarding: Please contact patient and ask about possible transfusion  ----- Message ----- From: Interface, Lab In [place] Sent: 02/21/2020   9:10 AM EDT To: Lequita Asal, MD

## 2020-02-22 ENCOUNTER — Inpatient Hospital Stay: Payer: Medicare Other

## 2020-02-22 ENCOUNTER — Other Ambulatory Visit: Payer: Self-pay | Admitting: *Deleted

## 2020-02-22 DIAGNOSIS — D4621 Refractory anemia with excess of blasts 1: Secondary | ICD-10-CM | POA: Diagnosis not present

## 2020-02-22 DIAGNOSIS — D469 Myelodysplastic syndrome, unspecified: Secondary | ICD-10-CM

## 2020-02-22 DIAGNOSIS — D649 Anemia, unspecified: Secondary | ICD-10-CM

## 2020-02-22 MED ORDER — ACETAMINOPHEN 325 MG PO TABS: 650.0000 mg | ORAL_TABLET | Freq: Once | ORAL | Status: AC

## 2020-02-22 MED ORDER — SODIUM CHLORIDE 0.9% IV SOLUTION
250.0000 mL | Freq: Once | INTRAVENOUS | Status: AC
  Administered 2020-02-22: 250 mL via INTRAVENOUS
  Filled 2020-02-22: qty 250

## 2020-02-22 MED ORDER — DIPHENHYDRAMINE HCL 25 MG PO CAPS: 25.0000 mg | ORAL_CAPSULE | Freq: Once | ORAL | Status: AC

## 2020-02-23 LAB — TYPE AND SCREEN
ABO/RH(D): B POS
Antibody Screen: NEGATIVE
Unit division: 0

## 2020-02-23 LAB — BPAM RBC
Blood Product Expiration Date: 202107012359
ISSUE DATE / TIME: 202106241321
Unit Type and Rh: 5100

## 2020-03-01 ENCOUNTER — Other Ambulatory Visit: Payer: Self-pay

## 2020-03-01 DIAGNOSIS — D469 Myelodysplastic syndrome, unspecified: Secondary | ICD-10-CM

## 2020-03-06 NOTE — Progress Notes (Signed)
 [facility]  [address], [address] [place], [place] [address] Phone: [phone]  Fax: [phone]   Clinic Day:  03/07/2020  Referring physician: Tracie Harrier, MD  Chief Complaint: Paul Clink. is a 86 y.o. 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  HPI: The patient was last seen in the hematology clinic on 02/08/2020. At that time, he was doing well.  He was able to do chores around his house.  Exam was stable. Hematocrit was 25.2, hemoglobin 7.8, MCV 85.4, platelets 134,000, WBC 2,300 (ANC 900). Calcium was 8.7. Alkaline phosphatase was 31. Ferritin was 985. [name] was postponed.  Bone marrow biopsy at [facility] on 02/15/2020 revealed a hypercellular bone marrow (80%) with persistent morphologic dyspoiesis and 9% blasts/promonocytes by manual differential.  He had a telemedicine visit with Dr Katina Dung on 03/01/2020.  Several options were discussed including a clinical trial at [facility] (bispecific anti-CD33 and CD3 antibody), azacytidine/venetoclax (response rate 40%; CR 10-20%), Revlimid (5 mg every day; RR 20% with a CR < 10%; plan 2-3 months), and best supportive care.  He choose best supportive care.  Labs followed: 02/12/2020: Hematocrit 23.4, hemoglobin 7.5, MCV 84.8, platelets 136,000, WBC 1,600 (ANC 600). 02/15/2020: Hematocrit 28.4, hemoglobin 9.1, MCV 87.3, platelets 181,000, WBC 1,800 (ANC 800). 02/21/2020: Hematocrit 22.8, hemoglobin 7.1, MCV 85.1, platelets 121,000, WBC 1,800 (ANC 600).  He received PRBCs on 02/13/2020 and 02/22/2020.  During the interim, he has been "pretty good." His energy level is good. He denies fevers, sweats, chest pain, cough, shortness of breath, and palpitations. The patient used to be on baby aspirin but stopped because it made his arms look "bloodshot."   [name] notes that the clinical trial they were offered was in [place], not [place]. He notes that  the other options considered were azacitidine and venetoclax vs Revlimid vs best supportive care. They are considering Revlimid and will discuss it tonight and sign the papers tomorrow when he comes for a transfusion.   Past Medical History:  Diagnosis Date  . Anemia   . Arthritis   . BPH (benign prostatic hypertrophy)   . Cancer ([place])   . Complication of anesthesia    nausea  . Diabetes type 2, controlled ([place])   . HOH (hard of hearing)    r and L ears-70% loss per pt  . Hyperlipidemia   . Hypertension   . Myelodysplasia (myelodysplastic syndrome) ([place]) 2019    Past Surgical History:  Procedure Laterality Date  . APPENDECTOMY    . COLONOSCOPY  09/21/08   1 polyp found, tubular adenoma  . HAND SURGERY    . KNEE SURGERY Left     Family History  Problem Relation Age of Onset  . Aneurysm Mother   . Heart disease Father   . Cancer Brother        skin  . Heart disease Brother   . Heart disease Brother   . Cancer Sister        skin  . Aneurysm Brother   . Heart disease Brother   . Heart disease Sister   . Heart disease Sister   . COPD Sister   . Diabetes Sister     Social History:  reports that he has never smoked. He has never used smokeless tobacco. He reports that he does not drink alcohol and does not use drugs. 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 Patient denies known exposures to radiation on toxins.  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 He had his 27-year marriage anniversary on 08/23/2018.He is walking 1-55mles per day.[place]. The patient is accompanied by his wife in person and son, RHeath Lark on the iPad today.  Allergies: No Known Allergies  Current Medications: Current Outpatient Medications  Medication Sig Dispense Refill  . aspirin 81 MG chewable tablet Chew 81 mg by mouth daily.    . Cranberry (THERACRAN PO) Take 1 tablet by mouth daily.     . cyanocobalamin 1000 MCG tablet  Take 1,000 mcg by mouth daily.    . finasteride (PROSCAR) 5 MG tablet Take 5 mg by mouth daily.    .Marland Kitchenglucose blood test strip FreeStyle Lite Strips    . LANCETS ULTRA FINE MISC Lancets,Ultra Thin 26 gauge    . lisinopril (PRINIVIL,ZESTRIL) 10 MG tablet Take 10 mg by mouth daily.    .Marland Kitchenloperamide (IMODIUM) 2 MG capsule Take 1 capsule (2 mg total) by mouth See admin instructions. With onset of loose stool, take 463mfollowed by 51m25mvery 2 hours until loose bowel movement stopped. Maximum: 16 mg/day 30 capsule 0  . meclizine (ANTIVERT) 25 MG tablet Take 25 mg by mouth 2 (two) times daily as needed.     . metformin (FORTAMET) 1000 MG (OSM) 24 hr tablet Take 1,000 mg by mouth 2 (two) times daily with a meal.    . Multiple Vitamins-Minerals (CENTRUM SILVER PO) Take by mouth.    . mupirocin ointment (BACTROBAN) 2 % Apply 1 application topically daily. With dressing changes 22 g 0  . Omega-3 Fatty Acids (FISH OIL PO) Take 1 tablet by mouth daily.     . ondansetron (ZOFRAN) 4 MG tablet Take 1 tablet (4 mg total) by mouth every 6 (six) hours as needed for nausea or vomiting. 30 tablet 2  . Polyethylene Glycol 3350 (MIRALAX PO) Take by mouth as needed.     . SClarnce Flocklmetto-Phytosterols (PROSTATE SR PO) Take 1 tablet by mouth daily.     . simvastatin (ZOCOR) 40 MG tablet Take 40 mg by mouth daily.    . gMarland Kitchenimepiride (AMARYL) 1 MG tablet Take 1 tablet by mouth daily after breakfast.     No current facility-administered medications for this visit.   Facility-Administered Medications Ordered in Other Visits  Medication Dose Route Frequency Provider Last Rate Last Admin  . acetaminophen (TYLENOL) tablet 650 mg  650 mg Oral Once CorLequita AsalD      . acetaminophen (TYLENOL) tablet 650 mg  650 mg Oral Once CorLequita AsalD      . acetaminophen (TYLENOL) tablet 650 mg  650 mg Oral Once CorLequita AsalD      . acetaminophen (TYLENOL) tablet 650 mg  650 mg Oral Once CorLequita AsalD        . diphenhydrAMINE (BENADRYL) capsule 25 mg  25 mg Oral Once CorNolon Stalls MD      . diphenhydrAMINE (BENADRYL) capsule 25 mg  25 mg Oral Once CorNolon Stalls MD      . diphenhydrAMINE (BENADRYL) capsule 25 mg  25 mg Oral Once CorNolon Stalls MD      . diphenhydrAMINE (BENADRYL) capsule 25 mg  25 mg Oral Once CorLequita AsalD        Review of Systems  Constitutional: Negative for chills, diaphoresis, fever, malaise/fatigue and weight loss (stable).  Doing "pretty good".  HENT: Positive for hearing loss. Negative for congestion, ear discharge, ear pain, nosebleeds, sinus pain, sore throat and tinnitus.   Eyes: Negative.  Negative for blurred vision.  Respiratory: Negative.  Negative for cough, hemoptysis, sputum production and shortness of breath.   Cardiovascular: Negative.  Negative for chest pain, palpitations and leg swelling.  Gastrointestinal: Negative.  Negative for abdominal pain, blood in stool, constipation (well managed with Miralax), diarrhea, heartburn, melena, nausea and vomiting.  Genitourinary: Negative.  Negative for dysuria, frequency, hematuria and urgency.  Musculoskeletal: Negative.  Negative for back pain, joint pain, myalgias and neck pain.  Skin: Negative.  Negative for itching and rash.  Neurological: Negative.  Negative for dizziness, tingling, sensory change, weakness and headaches.  Endo/Heme/Allergies: Does not bruise/bleed easily.       Diabetes.  Psychiatric/Behavioral: Negative.  Negative for depression and memory loss. The patient is not nervous/anxious and does not have insomnia.   All other systems reviewed and are negative.  Performance status (ECOG): 1  Vitals Blood pressure (!) 136/43, pulse 68, temperature (!) 96 F (35.6 C), temperature source Tympanic, resp. rate 18, weight 177 lb 11.1 oz (80.6 kg), SpO2 100 %.   Physical Exam Vitals and nursing note reviewed.  Constitutional:      General: He is not in acute  distress.    Appearance: He is well-developed. He is not diaphoretic.  HENT:     Head: Normocephalic and atraumatic.     Mouth/Throat:     Pharynx: No oropharyngeal exudate.  Eyes:     General: No scleral icterus.    Conjunctiva/sclera: Conjunctivae normal.     Pupils: Pupils are equal, round, and reactive to light.     Comments: Gold rimmed glasses.  Blue eyes.   Cardiovascular:     Rate and Rhythm: Normal rate and regular rhythm.     Heart sounds: Normal heart sounds. No murmur heard.   Pulmonary:     Effort: Pulmonary effort is normal. No respiratory distress.     Breath sounds: Normal breath sounds. No wheezing or rales.  Chest:     Chest wall: No tenderness.  Abdominal:     General: Bowel sounds are normal. There is no distension.     Palpations: Abdomen is soft. There is no mass.     Tenderness: There is no abdominal tenderness. There is no guarding or rebound.  Musculoskeletal:        General: No tenderness. Normal range of motion.     Cervical back: Normal range of motion and neck supple.  Lymphadenopathy:     Head:     Right side of head: No preauricular, posterior auricular or occipital adenopathy.     Left side of head: No preauricular, posterior auricular or occipital adenopathy.     Cervical: No cervical adenopathy.     Upper Body:     Right upper body: No supraclavicular or axillary adenopathy.     Left upper body: No supraclavicular or axillary adenopathy.     Lower Body: No right inguinal adenopathy. No left inguinal adenopathy.  Skin:    General: Skin is warm and dry.  Neurological:     Mental Status: He is alert and oriented to person, place, and time.  Psychiatric:        Behavior: Behavior normal.        Thought Content: Thought content normal.        Judgment: Judgment normal.    Orders Only on 03/07/2020  Component Date Value Ref Range Status  . ABO/RH(D) 03/07/2020 B POS   Final  . Antibody Screen 03/07/2020 NEG   Final  . Sample Expiration  03/07/2020 ,2359   Final  . Unit Number 03/07/2020 [id number]   Final  . Blood Component Type 03/07/2020 RBC, LR IRR   Final  . Unit division 03/07/2020 00   Final  . Status of Unit 03/07/2020 ISSUED,FINAL   Final  . Transfusion Status 03/07/2020 OK TO TRANSFUSE   Final  . Crossmatch Result 03/07/2020    Final                   Value:Compatible Performed at [facility], [address], [place], [place] [address]   . Order Confirmation 03/07/2020    Final                   Value:ORDER PROCESSED BY BLOOD BANK Performed at [facility] [address] [place], [place] [address]   . ISSUE DATE / TIME 03/07/2020 [id number]   Final  . Blood Product Unit Number 03/07/2020 [id number]   Final  . PRODUCT CODE 03/07/2020 [id number]   Final  . Unit Type and Rh 03/07/2020 7300   Final  . Blood Product Expiration Date 03/07/2020 [id number]   Final  Appointment on 03/07/2020  Component Date Value Ref Range Status  . Sodium 03/07/2020 135  135 - 145 mmol/L Final  . Potassium 03/07/2020 4.2  3.5 - 5.1 mmol/L Final  . Chloride 03/07/2020 103  98 - 111 mmol/L Final  . CO2 03/07/2020 25  22 - 32 mmol/L Final  . Glucose, Bld 03/07/2020 166* 70 - 99 mg/dL Final   Glucose reference range applies only to samples taken after fasting for at least 8 hours.  . BUN 03/07/2020 23  8 - 23 mg/dL Final  . Creatinine, Ser 03/07/2020 1.02  0.61 - 1.24 mg/dL Final  . Calcium 03/07/2020 8.7* 8.9 - 10.3 mg/dL Final  . Total Protein 03/07/2020 7.6  6.5 - 8.1 g/dL Final  . Albumin 03/07/2020 4.1  3.5 - 5.0 g/dL Final  . AST 03/07/2020 24  15 - 41 U/L Final  . ALT 03/07/2020 22  0 - 44 U/L Final  . Alkaline Phosphatase 03/07/2020 31* 38 - 126 U/L Final  . Total Bilirubin 03/07/2020 0.7  0.3 - 1.2 mg/dL Final  . GFR calc non Af Amer 03/07/2020 >60  >60 mL/min Final  . GFR calc Af Amer 03/07/2020 >60  >60 mL/min Final  . Anion gap 03/07/2020 7  5 - 15 Final   Performed at [facility], [address] [place], [place] [address]  . Ferritin 03/07/2020 811* 24 - 336 ng/mL Final   Performed at [facility], [address] [place], [place] [address]  . Blood Bank Specimen 03/07/2020 SAMPLE AVAILABLE FOR TESTING   Final  . Sample Expiration 03/07/2020    Final                   Value:,2359 Performed at [facility], [address] [place], [place] [address]   . WBC 03/07/2020 2.2* 4.0 - 10.5 K/uL Final  . RBC 03/07/2020 2.69* 4.22 - 5.81 MIL/uL Final  . Hemoglobin 03/07/2020 7.3* 13.0 - 17.0 g/dL Final  . HCT 03/07/2020 22.9* 39 - 52 % Final  . MCV 03/07/2020 85.1  80.0 - 100.0 fL Final  . MCH 03/07/2020 27.1  26.0 - 34.0 pg Final  .  MCHC 03/07/2020 31.9  30.0 - 36.0 g/dL Final  . RDW 03/07/2020 16.8* 11.5 - 15.5 % Final  . Platelets 03/07/2020 131* 150 - 400 K/uL Final  . nRBC 03/07/2020 0.0  0.0 - 0.2 % Final  . Neutrophils Relative % 03/07/2020 30  % Final  . Neutro Abs 03/07/2020 0.7* 1.7 - 7.7 K/uL Final  . Lymphocytes Relative 03/07/2020 60  % Final  . Lymphs Abs 03/07/2020 1.3  0.7 - 4.0 K/uL Final  . Monocytes Relative 03/07/2020 7  % Final  . Monocytes Absolute 03/07/2020 0.2  0 - 1 K/uL Final  . Eosinophils Relative 03/07/2020 1  % Final  . Eosinophils Absolute 03/07/2020 0.0  0 - 0 K/uL Final  . Basophils Relative 03/07/2020 0  % Final  . Basophils Absolute 03/07/2020 0.0  0 - 0 K/uL Final  . RBC Morphology 03/07/2020 NO SCHISTOCYTES SEEN   Final  . Smear Review 03/07/2020 Reviewed   Final  . Immature Granulocytes 03/07/2020 2  % Final  . Abs Immature Granulocytes 03/07/2020 0.05  0.00 - 0.07 K/uL Final  . Rouleaux 03/07/2020 PRESENT   Final  . Tear Drop Cells 03/07/2020 PRESENT   Final  . Ovalocytes 03/07/2020 PRESENT   Final   Performed at [facility], [address], [place], [place] [address]    Assessment:  Paul Pham. is a 86 y.o. 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 Bone marrowon 09/22/2017  revealed a hypercellular for age with dyspoietic changes variably involving myeloid cell lines, but with main involvement of the granulocytic/monocytic cell line. This was associated with bone marrow monocytosis and borderline number to slight increase in blastic cells. The overall changes favor a primary myeloid neoplasm, particularly a myelodysplastic syndrome especially refractory anemia with excess blasts (RAEB-1) or possibly refractory cytopenia with multilineage dysplasia. Consideration was also given to an evolving myelodysplastic/myeloproliferative neoplasm such as chronic myelomonocytic leukemia but the lack of absolute peripheral monocytosis precluded such a diagnosis at this time. Flow cytometrywas negative. Cytogeneticswere normal (46, XY). Foundation one was positive for ASXL1 [id number]*83, EZH2 Splice site 291-9T>Y, RUNX1 [id number]. IPSS-R4.5, intermediate group.  Anemia work-up on 08/17/2017: Vitamin B12 (658), folate (22.0), and TSH (2.688). Retic was 2.6%. Haptoglobin was 116 on 08/19/2017. Epo levelwas 104.8 on 10/05/2017.  Ferritin has been followed: 106 on 08/17/2017, 131 on 11/04/2017, 144 on 05/30/2018, 394 on 09/01/2018, 441 on 11/28/2018, 394 on 01/02/2019, 480 on 02/06/2019, 392 on 03/13/2019, 527 on 04/17/2019, 533 on 05/29/2019, 476 on 06/05/2019, 517 on 07/17/2019, 685 on 08/28/2019, and 724 on 10/16/2019. Iron saturation was 34% on 08/17/2017 and 89% on 09/01/2018.  Bone marrowon 09/22/2017 revealed a hypercellular for age with dyspoietic changes variably involving myeloid cell lines, but with main involvement of the granulocytic/monocytic cell line. This was associated with bone marrow monocytosis and borderline number to slight increase in blasts (5%). The overall changes favor a primary myeloid neoplasm, particularly a myelodysplastic syndrome especially refractory anemia with excess blasts (RAEB-1) or possibly refractory cytopenia with multilineage dysplasia.  Consideration was also given to an evolving myelodysplastic/myeloproliferative neoplasm such as chronic myelomonocytic leukemia but the lack of absolute peripheral monocytosis precluded such a diagnosis at this time. Flow cytometrywas negative. Cytogeneticswere normal (46, XY). Foundation One was positive for ASXL1 O060OK*59, EZH2 Splice site 977-4F>S, RUNX1 [id number].  He has received20cycles ofVidaza(11/04/2017 - 08/22/2018; 10/24/2018-10/23/2019). He has received Aranesp(initially 150 mcg every 2 weeks post chemotherapy then 300 mcg every 1-2 weeks after cycle #3; last 09/19/2018). He last received Aranesp on02/15/2021. He has  received 1-2units of PRBCswith each cycle.He has received36units of PRBCs since 08/17/2017 (last 02/22/2020).  Bone marrowon 08/12/2018 revealed a hypercellular marrow with dyspoietic changes. There was a borderline number of blasts (5%) seen by morphology. The findings were similar to previous biopsy although the cellularity is slightly higher in the current material.consistent with previously known myelodysplastic syndrome. Flow cytometry was negative. Cytogenetics were normal (46, XY).  Bone marrowon 11/27/2019 revealed a hypercellular bone marrow (60-80%) with myelodysplastic syndrome. The morphologic features were similar to the previous biopsy. There was no definite increase in blasts. Flow cytometry revealed no significant CD34-positive blastic population, increase in monocytic cells (up to 18%), Tcells with nonspecific phenotypic changes, and no monoclonal B-cell population identified.Cytogenetics were normal (46, XY). FISH was negative. Next-Gen myeloid disorder profilerevealsASXL1G653f*15,EZH2splice site 1976-7H>A,LPFXTKsite cW.409-7DZH splice site cG.992-4_[id number],LGXQ1J941D*40 There were no abnormalities in FLT3, IDH1, IDH2, NPM1.  [facility] revealed a hypercellular bone marrow (80%) with  persistent morphologic dyspoiesis and 9% blasts/promonocytes by manual differential.  HereceivedbothPfizervaccinesfor COVID-19(firston01/01/2020).  Symptomatically, he feels "good".  He denies any B symptoms.  He is undecided about direction of therapy.  Plan: 1.   Labs today: CBC with diff, CMP, ferritin, hold tube. 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  Helast received Vidazaon 10/23/2019. Treatmentgoalhas been todecrease transfusion dependence and prevent acceleration of disease (blasts). Bone marrow on 11/27/2019 was similar to 08/12/2018. There was no increase in blasts. Monocyte count is 18%. Next-Gen myeloid disorder profile [id number],EZH2splice site 11814-4Y>J,EHUDJSite c.H.702-6VZCsplice site c.H.885-0_277-4JOINOMVEH,MCNO7S962E*36There were no abnormalities in FLT3, IDH1, IDH2, NPM1. [facility]   He has a hypercellular bone marrow (80%) with persistent morphologic dyspoiesis and 9% blasts/promonocytes by manual differential.  Discuss treatment options:   [place]    Not feasible given the distance.    Azacytidine/venetoclax (response rate 40%; CR 10-20%).    Revlimid (5 mg every day; RR 20% with a CR < 10%; plan 2-3 months).    [facility].  [facility] but today is considering Revlimid.   Review potential side effects.   Information provided.   Discuss paperwork and contacting our oral chemotherapy pharmacist (ANuala Alphare: grants.   Patient undecided and will discuss with his family tonight.  Hematocrit 22.9.  Hemoglobin 7.3.  MCV 85.1.  Patient wishes to receive a transfusion.  He is requiring a 1  unit of PRBCs every 8-14 days. 3. Iron overload Ferritin811 today. He has received36units of PRBCs to date (08/17/2017 -02/22/2020). No plans for Jadenu secondary to leukopenia. 4.   Transfuse 1 unit PRBCs tomorrow. 5.   Preauth Revlimid (RN to provide paperwork). 6.   RTC in 2 weeks MD assessment and labs (CBC with diff, CMP, hold tube) and +/- transfusion the next day.  I discussed the assessment and treatment plan with the patient.  The patient was provided an opportunity to ask questions and all were answered.  The patient agreed with the plan and demonstrated an understanding of the instructions.  The patient was advised to call back if the symptoms worsen or if the condition fails to improve as anticipated.  I provided 18 minutes of face-to-face time during this this encounter and > 50% was spent counseling as documented under my assessment and plan.  An additional 10+ minutes were spent reviewing his chart (Epic and Care  Everywhere) including notes, labs, bone marrow, and imaging studies.   Lequita Asal, MD, PhD    03/07/2020, 5:03 PM  I, De Burrs, am acting as scribe for [employer]. Mike Gip, MD, PhD.  I, Nashayla Telleria C. Mike Gip, MD, have reviewed the above documentation for accuracy and completeness, and I agree with the above.

## 2020-03-07 ENCOUNTER — Inpatient Hospital Stay (HOSPITAL_BASED_OUTPATIENT_CLINIC_OR_DEPARTMENT_OTHER): Payer: Medicare Other | Admitting: Hematology and Oncology

## 2020-03-07 ENCOUNTER — Inpatient Hospital Stay: Payer: Medicare Other | Attending: Hematology and Oncology

## 2020-03-07 ENCOUNTER — Encounter: Payer: Self-pay | Admitting: Hematology and Oncology

## 2020-03-07 ENCOUNTER — Other Ambulatory Visit: Payer: Self-pay

## 2020-03-07 ENCOUNTER — Other Ambulatory Visit: Payer: Self-pay | Admitting: *Deleted

## 2020-03-07 ENCOUNTER — Other Ambulatory Visit: Payer: Self-pay | Admitting: Hematology and Oncology

## 2020-03-07 VITALS — BP 136/43 | HR 68 | Temp 96.0°F | Resp 18 | Wt 177.7 lb

## 2020-03-07 DIAGNOSIS — E119 Type 2 diabetes mellitus without complications: Secondary | ICD-10-CM | POA: Insufficient documentation

## 2020-03-07 DIAGNOSIS — D469 Myelodysplastic syndrome, unspecified: Secondary | ICD-10-CM

## 2020-03-07 DIAGNOSIS — I10 Essential (primary) hypertension: Secondary | ICD-10-CM | POA: Diagnosis not present

## 2020-03-07 DIAGNOSIS — Z7189 Other specified counseling: Secondary | ICD-10-CM

## 2020-03-07 DIAGNOSIS — D649 Anemia, unspecified: Secondary | ICD-10-CM

## 2020-03-07 DIAGNOSIS — D72819 Decreased white blood cell count, unspecified: Secondary | ICD-10-CM | POA: Diagnosis not present

## 2020-03-07 DIAGNOSIS — D4621 Refractory anemia with excess of blasts 1: Secondary | ICD-10-CM | POA: Insufficient documentation

## 2020-03-07 LAB — SAMPLE TO BLOOD BANK

## 2020-03-07 LAB — CBC WITH DIFFERENTIAL/PLATELET
Abs Immature Granulocytes: 0.05 K/uL (ref 0.00–0.07)
Basophils Absolute: 0 K/uL (ref 0.0–0.1)
Basophils Relative: 0 %
Eosinophils Absolute: 0 K/uL (ref 0.0–0.5)
Eosinophils Relative: 1 %
HCT: 22.9 % — ABNORMAL LOW (ref 39.0–52.0)
Hemoglobin: 7.3 g/dL — ABNORMAL LOW (ref 13.0–17.0)
Immature Granulocytes: 2 %
Lymphocytes Relative: 60 %
Lymphs Abs: 1.3 K/uL (ref 0.7–4.0)
MCH: 27.1 pg (ref 26.0–34.0)
MCHC: 31.9 g/dL (ref 30.0–36.0)
MCV: 85.1 fL (ref 80.0–100.0)
Monocytes Absolute: 0.2 K/uL (ref 0.1–1.0)
Monocytes Relative: 7 %
Neutro Abs: 0.7 K/uL — ABNORMAL LOW (ref 1.7–7.7)
Neutrophils Relative %: 30 %
Platelets: 131 K/uL — ABNORMAL LOW (ref 150–400)
RBC Morphology: NONE SEEN
RBC: 2.69 MIL/uL — ABNORMAL LOW (ref 4.22–5.81)
RDW: 16.8 % — ABNORMAL HIGH (ref 11.5–15.5)
WBC: 2.2 K/uL — ABNORMAL LOW (ref 4.0–10.5)
nRBC: 0 % (ref 0.0–0.2)

## 2020-03-07 LAB — COMPREHENSIVE METABOLIC PANEL
ALT: 22 U/L (ref 0–44)
AST: 24 U/L (ref 15–41)
Albumin: 4.1 g/dL (ref 3.5–5.0)
Alkaline Phosphatase: 31 U/L — ABNORMAL LOW (ref 38–126)
Anion gap: 7 (ref 5–15)
BUN: 23 mg/dL (ref 8–23)
CO2: 25 mmol/L (ref 22–32)
Calcium: 8.7 mg/dL — ABNORMAL LOW (ref 8.9–10.3)
Chloride: 103 mmol/L (ref 98–111)
Creatinine, Ser: 1.02 mg/dL (ref 0.61–1.24)
GFR calc Af Amer: >60 mL/min (ref >60)
GFR calc non Af Amer: >60 mL/min (ref >60)
Glucose, Bld: 166 mg/dL — ABNORMAL HIGH (ref 70–99)
Potassium: 4.2 mmol/L (ref 3.5–5.1)
Sodium: 135 mmol/L (ref 135–145)
Total Bilirubin: 0.7 mg/dL (ref 0.3–1.2)
Total Protein: 7.6 g/dL (ref 6.5–8.1)

## 2020-03-07 LAB — FERRITIN: Ferritin: 811 ng/mL — ABNORMAL HIGH (ref 24–336)

## 2020-03-07 LAB — PREPARE RBC (CROSSMATCH)

## 2020-03-07 NOTE — Patient Instructions (Signed)
Lenalidomide Oral Capsules What is this medicine? LENALIDOMIDE (len a LID oh mide) is a chemotherapy drug that targets specific proteins within cancer cells and stops the cancer cell from growing. It is used to treat multiple myeloma, certain types of lymphoma, and some myelodysplastic syndromes that cause severe anemia requiring blood transfusions. This medicine may be used for other purposes; ask your health care provider or pharmacist if you have questions. COMMON BRAND NAME(S): Revlimid What should I tell my health care provider before I take this medicine? They need to know if you have any of these conditions:  blood clots in the legs or the lungs  high blood pressure  high cholesterol  infection  irregular monthly periods or menstrual cycles  kidney disease  liver disease  smoke tobacco  thyroid disease  an unusual or allergic reaction to lenalidomide, thalidomide, other medicines, foods, dyes, or preservatives  pregnant or trying to get pregnant  breast-feeding How should I use this medicine? Take this medicine by mouth with a glass of water. Follow the directions on the prescription label. Do not cut, crush, or chew this medicine. Take your medicine at regular intervals. Do not take it more often than directed. Do not stop taking except on your doctor's advice. A MedGuide will be given with each prescription and refill. Read this guide carefully each time. The MedGuide may change frequently. Talk to your pediatrician regarding the use of this medicine in children. Special care may be needed. Overdosage: If you think you have taken too much of this medicine contact a poison control center or emergency room at once. NOTE: This medicine is only for you. Do not share this medicine with others. What if I miss a dose? If you miss a dose, take it as soon as you can. If your next dose is to be taken in less than 12 hours, then do not take the missed dose. Take the next dose at  your regular time. Do not take double or extra doses. What may interact with this medicine? This medicine may interact with the following medications:  digoxin  medicines that increase the risk of thrombosis like estrogens or erythropoietic agents (e.g., epoetin alfa and darbepoetin alfa)  warfarin This list may not describe all possible interactions. Give your health care provider a list of all the medicines, herbs, non-prescription drugs, or dietary supplements you use. Also tell them if you smoke, drink alcohol, or use illegal drugs. Some items may interact with your medicine. What should I watch for while using this medicine? You may need blood work done while you are taking this medicine. This medicine may cause serious skin reactions. They can happen weeks to months after starting the medicine. Contact your health care provider right away if you notice fevers or flu-like symptoms with a rash. The rash may be red or purple and then turn into blisters or peeling of the skin. Or, you might notice a red rash with swelling of the face, lips or lymph nodes in your neck or under your arms. This medicine is available only through a special program. Doctors, pharmacies, and patients must meet all of the conditions of the program. Your health care provider will help you get signed up with the program if you need this medicine. Through the program you will only receive up to a 28 day supply of the medicine at one time. You will need a new prescription for each refill. This medicine can cause birth defects. Do not get pregnant while  taking this drug. Females with child-bearing potential will need to have 2 negative pregnancy tests before starting this medicine. Pregnancy testing must be done every 2 to 4 weeks as directed while taking this medicine. Use 2 reliable forms of birth control together while you are taking this medicine and for 4 weeks after you stop taking this medicine. If you think that you  might be pregnant talk to your doctor right away. Do not breast-feed an infant while taking this medicine. Men must use a latex condom during sexual contact with a woman while taking this medicine and for 4 weeks after you stop taking this medicine. A latex condom is needed even if you have had a vasectomy. Contact your doctor right away if your partner becomes pregnant. Do not donate sperm while taking this medicine and for 4 weeks after you stop taking this medicine. Do not give blood while taking the medicine and for 4 weeks after completion of treatment to avoid exposing pregnant women to the medicine through the donated blood. Talk to your doctor about your risk of cancer. You may be more at risk for certain types of cancers if you take this medicine. What side effects may I notice from receiving this medicine? Side effects that you should report to your doctor or health care professional as soon as possible:  allergic reactions like skin rash, itching or hives, swelling of the face, lips, or tongue  breathing problems  chest pain or tightness  fast, irregular heartbeat  feeling faint  low blood counts - this medicine may decrease the number of white blood cells, red blood cells and platelets. You may be at increased risk for infections and bleeding.  rash, fever, and swollen lymph nodes  redness, blistering, peeling or loosening of the skin, including inside the mouth  seizures  signs and symptoms of bleeding such as bloody or black, tarry stools; red or dark-brown urine; spitting up blood or brown material that looks like coffee grounds; red spots on the skin; unusual bruising or bleeding from the eye, gums, or nose  signs and symptoms of a blood clot such as breathing problems; changes in vision; chest pain; severe, sudden headache; pain, swelling, warmth in the leg; trouble speaking; sudden numbness or weakness of the face, arm or leg  signs and symptoms of liver injury like  dark yellow or brown urine; general ill feeling or flu-like symptoms; light-colored stools; loss of appetite; nausea; right upper belly pain; unusually weak or tired; yellowing of the eyes or skin  signs and symptoms of a stroke like changes in vision; confusion; trouble speaking or understanding; severe headaches; sudden numbness or weakness of the face, arm or leg; trouble walking; dizziness; loss of balance or coordination  sweating  vomiting Side effects that usually do not require medical attention (report to your doctor or health care professional if they continue or are bothersome):  constipation  cough  diarrhea  joint pain  muscle cramps  swelling of the arms, legs, or skin  tiredness  trouble sleeping This list may not describe all possible side effects. Call your doctor for medical advice about side effects. You may report side effects to FDA at 1-800-FDA-1088. Where should I keep my medicine? Keep out of the reach of children. Store at room temperature between 15 and 30 degrees C (59 and 86 degrees F). Throw away any unused medicine after the expiration date. NOTE: This sheet is a summary. It may not cover all possible information. If you  have questions about this medicine, talk to your doctor, pharmacist, or health care provider.  2020 [employer] (2018-11-18 15:09:17)

## 2020-03-07 NOTE — Progress Notes (Signed)
Patient here for oncology follow-up appointment, expresses no complaints or concerns at this time. Has questions about [facility] suggestions to Dr. Mike Gip.

## 2020-03-08 ENCOUNTER — Telehealth: Payer: Self-pay

## 2020-03-08 ENCOUNTER — Inpatient Hospital Stay: Payer: Medicare Other

## 2020-03-08 DIAGNOSIS — D4621 Refractory anemia with excess of blasts 1: Secondary | ICD-10-CM | POA: Diagnosis not present

## 2020-03-08 DIAGNOSIS — D469 Myelodysplastic syndrome, unspecified: Secondary | ICD-10-CM

## 2020-03-08 DIAGNOSIS — D649 Anemia, unspecified: Secondary | ICD-10-CM

## 2020-03-08 MED ORDER — DIPHENHYDRAMINE HCL 25 MG PO CAPS: 25.0000 mg | ORAL_CAPSULE | Freq: Once | ORAL | Status: DC

## 2020-03-08 MED ORDER — ACETAMINOPHEN 325 MG PO TABS: 650.0000 mg | ORAL_TABLET | Freq: Once | ORAL | Status: DC

## 2020-03-08 MED ORDER — SODIUM CHLORIDE 0.9 % IV SOLN
INTRAVENOUS | Status: DC
  Filled 2020-03-08: qty 250

## 2020-03-08 NOTE — Telephone Encounter (Signed)
Spoke with the patient and his wife about starting Revlimid. They are not in agreement with starting Revlimid and want to continue supportative care at this moment.The patient and wife was very understanding and agreeable.

## 2020-03-09 LAB — TYPE AND SCREEN
ABO/RH(D): B POS
Antibody Screen: NEGATIVE
Unit division: 0

## 2020-03-09 LAB — BPAM RBC
Blood Product Expiration Date: 202108032359
ISSUE DATE / TIME: 202107091013
Unit Type and Rh: 7300

## 2020-03-13 ENCOUNTER — Other Ambulatory Visit: Payer: Self-pay

## 2020-03-13 DIAGNOSIS — D469 Myelodysplastic syndrome, unspecified: Secondary | ICD-10-CM

## 2020-03-18 ENCOUNTER — Other Ambulatory Visit: Payer: TRICARE For Life (TFL)

## 2020-03-18 ENCOUNTER — Ambulatory Visit: Payer: TRICARE For Life (TFL) | Admitting: Hematology and Oncology

## 2020-03-19 NOTE — Progress Notes (Signed)
[facility]  [address], [address] [place],  [address] Phone: [phone]  Fax: [phone]   Clinic Day:  03/20/2020   Referring physician: Tracie Harrier, MD  Chief Complaint: Paul Pham. is a 86 y.o. male with RAEB-1 who is seen for 2 week assessment and further discussion regarding direction of therapy.  HPI: The patient was last seen in the hematology clinic on 03/07/2020. At that time, he felt "good".  He denied any B symptoms.  He was undecided about direction of therapy. Hematocrit was 22.9, hemoglobin 7.3, MCV 85.1, platelets 131,000, WBC 2200 with an ANC 1300.    He received a PRBC transfusion on 03/08/2020.  During the interim, he has been well overall.  He continues to try and walk two miles every day as he is able. He is able to do more chores.  At this time, he does not want to take Revlimid. He is unable to make the travels to [place] for the clinical trial he could receive there. At this time, he would like to continue supportive care and undergo periodic transfusion as needed. He is agreeable to receiving a transfusion this week rather than waiting for his counts to drop further.    Past Medical History:  Diagnosis Date  . Anemia   . Arthritis   . BPH (benign prostatic hypertrophy)   . Cancer ([place])   . Complication of anesthesia    nausea  . Diabetes type 2, controlled ([place])   . HOH (hard of hearing)    r and L ears-70% loss per pt  . Hyperlipidemia   . Hypertension   . Myelodysplasia (myelodysplastic syndrome) ([place]) 2019    Past Surgical History:  Procedure Laterality Date  . APPENDECTOMY    . COLONOSCOPY  09/21/08   1 polyp found, tubular adenoma  . HAND SURGERY    . KNEE SURGERY Left     Family History  Problem Relation Age of Onset  . Aneurysm Mother   . Heart disease Father   . Cancer Brother        skin  . Heart disease Brother   . Heart disease Brother   . Cancer Sister        skin  .  Aneurysm Brother   . Heart disease Brother   . Heart disease Sister   . Heart disease Sister   . COPD Sister   . Diabetes Sister     Social History:  reports that he has never smoked. He has never used smokeless tobacco. He reports that he does not drink alcohol and does not use drugs. 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 Patient denies known exposures to radiation on toxins. 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 He had his 59-year marriage anniversary on 08/23/2018.He is walking 1-24mles per day.[place] The [place]   Allergies: No Known Allergies  Current Medications: Current Outpatient Medications  Medication Sig Dispense Refill  . aspirin 81 MG chewable tablet Chew 81 mg by mouth daily.    . Cranberry (THERACRAN PO) Take 1 tablet by mouth daily.     . cyanocobalamin 1000 MCG tablet Take 1,000 mcg by mouth daily.    . finasteride (PROSCAR) 5 MG tablet Take 5 mg by mouth daily.    .Marland Kitchenglimepiride (AMARYL) 1 MG tablet Take 1 tablet by mouth daily after breakfast.    .  glucose blood test strip FreeStyle Lite Strips    . LANCETS ULTRA FINE MISC Lancets,Ultra Thin 26 gauge    . lisinopril (PRINIVIL,ZESTRIL) 10 MG tablet Take 10 mg by mouth daily.    Marland Kitchen loperamide (IMODIUM) 2 MG capsule Take 1 capsule (2 mg total) by mouth See admin instructions. With onset of loose stool, take 54m followed by 237mevery 2 hours until loose bowel movement stopped. Maximum: 16 mg/day 30 capsule 0  . meclizine (ANTIVERT) 25 MG tablet Take 25 mg by mouth 2 (two) times daily as needed.     . metformin (FORTAMET) 1000 MG (OSM) 24 hr tablet Take 1,000 mg by mouth 2 (two) times daily with a meal.    . Multiple Vitamins-Minerals (CENTRUM SILVER PO) Take by mouth.    . mupirocin ointment (BACTROBAN) 2 % Apply 1 application topically daily. With dressing changes 22 g 0  . Omega-3 Fatty Acids (FISH OIL  PO) Take 1 tablet by mouth daily.     . ondansetron (ZOFRAN) 4 MG tablet Take 1 tablet (4 mg total) by mouth every 6 (six) hours as needed for nausea or vomiting. 30 tablet 2  . Polyethylene Glycol 3350 (MIRALAX PO) Take by mouth as needed.     . Clarnce Flockalmetto-Phytosterols (PROSTATE SR PO) Take 1 tablet by mouth daily.     . simvastatin (ZOCOR) 40 MG tablet Take 40 mg by mouth daily.     No current facility-administered medications for this visit.   Facility-Administered Medications Ordered in Other Visits  Medication Dose Route Frequency Provider Last Rate Last Admin  . acetaminophen (TYLENOL) tablet 650 mg  650 mg Oral Once CoLequita AsalMD      . acetaminophen (TYLENOL) tablet 650 mg  650 mg Oral Once CoLequita AsalMD      . acetaminophen (TYLENOL) tablet 650 mg  650 mg Oral Once CoLequita AsalMD      . acetaminophen (TYLENOL) tablet 650 mg  650 mg Oral Once CoLequita AsalMD      . diphenhydrAMINE (BENADRYL) capsule 25 mg  25 mg Oral Once CoLequita AsalMD      . diphenhydrAMINE (BENADRYL) capsule 25 mg  25 mg Oral Once CoNolon Stalls, MD      . diphenhydrAMINE (BENADRYL) capsule 25 mg  25 mg Oral Once CoNolon Stalls, MD      . diphenhydrAMINE (BENADRYL) capsule 25 mg  25 mg Oral Once CoLequita AsalMD        Review of Systems  Constitutional: Negative for chills, diaphoresis, fever, malaise/fatigue and weight loss (up 1 lb).       Doing "pretty good".  HENT: Positive for hearing loss. Negative for congestion, ear discharge, ear pain, nosebleeds, sinus pain, sore throat and tinnitus.   Eyes: Negative.  Negative for blurred vision.  Respiratory: Negative.  Negative for cough, hemoptysis, sputum production and shortness of breath.   Cardiovascular: Negative.  Negative for chest pain, palpitations and leg swelling.  Gastrointestinal: Negative.  Negative for abdominal pain, blood in stool, constipation (well managed with Miralax), diarrhea,  heartburn, melena, nausea and vomiting.  Genitourinary: Negative.  Negative for dysuria, frequency, hematuria and urgency.  Musculoskeletal: Negative.  Negative for back pain, joint pain, myalgias and neck pain.  Skin: Negative.  Negative for itching and rash.  Neurological: Negative.  Negative for dizziness, tingling, sensory change, weakness and headaches.  Endo/Heme/Allergies: Does not bruise/bleed easily.       Diabetes.  Psychiatric/Behavioral: Negative.  Negative for depression and memory loss. The patient is not nervous/anxious and does not have insomnia.   All other systems reviewed and are negative.  Performance status (ECOG): 1 - Symptomatic but completely ambulatory   Vitals Blood pressure (!) 139/47, pulse 67, temperature (!) 97 F (36.1 C), temperature source Tympanic, weight 178 lb 0.3 oz (80.7 kg), SpO2 100 %.   Physical Exam Vitals and nursing note reviewed.  Constitutional:      General: He is not in acute distress.    Appearance: He is well-developed. He is not diaphoretic.  HENT:     Head: Normocephalic and atraumatic.     Mouth/Throat:     Mouth: Mucous membranes are moist. Oral lesions (small bite inner cheek) present.     Pharynx: No oropharyngeal exudate.  Eyes:     General: No scleral icterus.    Conjunctiva/sclera: Conjunctivae normal.     Pupils: Pupils are equal, round, and reactive to light.     Comments: Gold rimmed glasses.  Blue eyes.   Cardiovascular:     Rate and Rhythm: Normal rate and regular rhythm.     Heart sounds: Normal heart sounds. No murmur heard.   Pulmonary:     Effort: Pulmonary effort is normal. No respiratory distress.     Breath sounds: Normal breath sounds. No wheezing or rales.  Chest:     Chest wall: No tenderness.  Abdominal:     General: Bowel sounds are normal. There is no distension.     Palpations: Abdomen is soft. There is no mass.     Tenderness: There is no abdominal tenderness. There is no guarding or rebound.    Musculoskeletal:        General: Normal range of motion.     Cervical back: Normal range of motion and neck supple.  Lymphadenopathy:     Head:     Right side of head: No preauricular, posterior auricular or occipital adenopathy.     Left side of head: No preauricular, posterior auricular or occipital adenopathy.     Cervical: No cervical adenopathy.     Upper Body:     Right upper body: No supraclavicular or axillary adenopathy.     Left upper body: No supraclavicular or axillary adenopathy.     Lower Body: No right inguinal adenopathy. No left inguinal adenopathy.  Skin:    General: Skin is warm and dry.  Neurological:     Mental Status: He is alert and oriented to person, place, and time.  Psychiatric:        Behavior: Behavior normal.        Thought Content: Thought content normal.        Judgment: Judgment normal.    Appointment on 03/20/2020  Component Date Value Ref Range Status  . Sodium 03/20/2020 136  135 - 145 mmol/L Final  . Potassium 03/20/2020 4.4  3.5 - 5.1 mmol/L Final  . Chloride 03/20/2020 101  98 - 111 mmol/L Final  . CO2 03/20/2020 25  22 - 32 mmol/L Final  . Glucose, Bld 03/20/2020 211* 70 - 99 mg/dL Final   Glucose reference range applies only to samples taken after fasting for at least 8 hours.  . BUN 03/20/2020 23  8 - 23 mg/dL Final  . Creatinine, Ser 03/20/2020 1.07  0.61 - 1.24 mg/dL Final  . Calcium 03/20/2020 8.7* 8.9 - 10.3 mg/dL Final  . Total Protein 03/20/2020 7.1  6.5 - 8.1 g/dL Final  .  Albumin 03/20/2020 3.9  3.5 - 5.0 g/dL Final  . AST 03/20/2020 22  15 - 41 U/L Final  . ALT 03/20/2020 21  0 - 44 U/L Final  . Alkaline Phosphatase 03/20/2020 30* 38 - 126 U/L Final  . Total Bilirubin 03/20/2020 0.9  0.3 - 1.2 mg/dL Final  . GFR calc non Af Amer 03/20/2020 >60  >60 mL/min Final  . GFR calc Af Amer 03/20/2020 >60  >60 mL/min Final  . Anion gap 03/20/2020 10  5 - 15 Final   Performed at [facility], [address]  [place], [place] [address]  . WBC 03/20/2020 1.8* 4.0 - 10.5 K/uL Final  . RBC 03/20/2020 2.76* 4.22 - 5.81 MIL/uL Final  . Hemoglobin 03/20/2020 7.7* 13.0 - 17.0 g/dL Final  . HCT 03/20/2020 24.3* 39 - 52 % Final  . MCV 03/20/2020 88.0  80.0 - 100.0 fL Final  . MCH 03/20/2020 27.9  26.0 - 34.0 pg Final  . MCHC 03/20/2020 31.7  30.0 - 36.0 g/dL Final  . RDW 03/20/2020 16.3* 11.5 - 15.5 % Final  . Platelets 03/20/2020 PENDING  150 - 400 K/uL Incomplete  . nRBC 03/20/2020 0.0  0.0 - 0.2 % Final   Performed at [facility], [address], [place], [place] [address]  . Neutrophils Relative % 03/20/2020 PENDING  % Incomplete  . Neutro Abs 03/20/2020 PENDING  1.7 - 7.7 K/uL Incomplete  . Band Neutrophils 03/20/2020 PENDING  % Incomplete  . Lymphocytes Relative 03/20/2020 PENDING  % Incomplete  . Lymphs Abs 03/20/2020 PENDING  0.7 - 4.0 K/uL Incomplete  . Monocytes Relative 03/20/2020 PENDING  % Incomplete  . Monocytes Absolute 03/20/2020 PENDING  0 - 1 K/uL Incomplete  . Eosinophils Relative 03/20/2020 PENDING  % Incomplete  . Eosinophils Absolute 03/20/2020 PENDING  0 - 0 K/uL Incomplete  . Basophils Relative 03/20/2020 PENDING  % Incomplete  . Basophils Absolute 03/20/2020 PENDING  0 - 0 K/uL Incomplete  . WBC Morphology 03/20/2020 PENDING   Incomplete  . RBC Morphology 03/20/2020 PENDING   Incomplete  . Smear Review 03/20/2020 PENDING   Incomplete  . Other 03/20/2020 PENDING  % Incomplete  . nRBC 03/20/2020 PENDING  0 /100 WBC Incomplete  . Metamyelocytes Relative 03/20/2020 PENDING  % Incomplete  . Myelocytes 03/20/2020 PENDING  % Incomplete  . Promyelocytes Relative 03/20/2020 PENDING  % Incomplete  . Blasts 03/20/2020 PENDING  % Incomplete  . Immature Granulocytes 03/20/2020 PENDING  % Incomplete  . Abs Immature Granulocytes 03/20/2020 PENDING  0.00 - 0.07 K/uL Incomplete    Assessment:  Paul Pham. is a 86 y.o. 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 Bone marrowon 09/22/2017 revealed a  hypercellular for age with dyspoietic changes variably involving myeloid cell lines, but with main involvement of the granulocytic/monocytic cell line. This was associated with bone marrow monocytosis and borderline number to slight increase in blastic cells. The overall changes favor a primary myeloid neoplasm, particularly a myelodysplastic syndrome especially refractory anemia with excess blasts (RAEB-1) or possibly refractory cytopenia with multilineage dysplasia. Consideration was also given to an evolving myelodysplastic/myeloproliferative neoplasm such as chronic myelomonocytic leukemia but the lack of absolute peripheral monocytosis precluded such a diagnosis at this time. Flow cytometrywas negative. Cytogeneticswere normal (46, XY). Foundation one was positive for ASXL1 R916BW*46, EZH2 Splice site 659-9J>T, RUNX1 [id number]. IPSS-R4.5, intermediate group.  Anemia work-up on 08/17/2017: Vitamin B12 (658), folate (22.0), and TSH (2.688). Retic was 2.6%. Haptoglobin was 116 on 08/19/2017. Epo levelwas 104.8  on 10/05/2017.  Ferritin has been followed: 106 on 08/17/2017, 131 on 11/04/2017, 144 on 05/30/2018, 394 on 09/01/2018, 441 on 11/28/2018, 394 on 01/02/2019, 480 on 02/06/2019, 392 on 03/13/2019, 527 on 04/17/2019, 533 on 05/29/2019, 476 on 06/05/2019, 517 on 07/17/2019, 685 on 08/28/2019, and 724 on 10/16/2019. Iron saturation was 34% on 08/17/2017 and 89% on 09/01/2018.  Bone marrowon 09/22/2017 revealed a hypercellular for age with dyspoietic changes variably involving myeloid cell lines, but with main involvement of the granulocytic/monocytic cell line. This was associated with bone marrow monocytosis and borderline number to slight increase in blasts (5%). The overall changes favor a primary myeloid neoplasm, particularly a myelodysplastic syndrome especially refractory anemia with excess blasts (RAEB-1) or possibly refractory cytopenia with multilineage dysplasia. Consideration was  also given to an evolving myelodysplastic/myeloproliferative neoplasm such as chronic myelomonocytic leukemia but the lack of absolute peripheral monocytosis precluded such a diagnosis at this time. Flow cytometrywas negative. Cytogeneticswere normal (46, XY). Foundation One was positive for ASXL1 O329VB*16, EZH2 Splice site 606-0O>K, RUNX1 [id number].  He has received20cycles ofVidaza(11/04/2017 - 08/22/2018; 10/24/2018-10/23/2019). He has received Aranesp(initially 150 mcg every 2 weeks post chemotherapy then 300 mcg every 1-2 weeks after cycle #3; last 09/19/2018). He last received Aranesp on02/15/2021. He has received 1-2units of PRBCswith each cycle.He has received37units of PRBCs since 08/17/2017 (last 03/08/2020).  Bone marrowon 08/12/2018 revealed a hypercellular marrow with dyspoietic changes. There was a borderline number of blasts (5%) seen by morphology. The findings were similar to previous biopsy although the cellularity is slightly higher in the current material.consistent with previously known myelodysplastic syndrome. Flow cytometry was negative. Cytogenetics were normal (46, XY).  Bone marrowon 11/27/2019 revealed a hypercellular bone marrow (60-80%) with myelodysplastic syndrome. The morphologic features were similar to the previous biopsy. There was no definite increase in blasts. Flow cytometry revealed no significant CD34-positive blastic population, increase in monocytic cells (up to 18%), Tcells with nonspecific phenotypic changes, and no monoclonal B-cell population identified.Cytogenetics were normal (46, XY). FISH was negative. Next-Gen myeloid disorder profilerevealsASXL1G63f*15,EZH2splice site 1599-7F>S,FSELTRsite cV.202-3XID splice site cH.686-1_683-7GBMSXJDBZ,MCEY2M336P*22 There were no abnormalities in FLT3, IDH1, IDH2, NPM1.  [facility] revealed a hypercellular bone marrow (80%) with persistent  morphologic dyspoiesis and 9% blasts/promonocytes by manual differential.  HereceivedbothPfizervaccinesfor COVID-19(firston01/01/2020).  Symptomatically, he is doing fairly well. He is able to walk daily and do more chores. Exam is stable.  Plan: 1.   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  Helast received Vidazaon 10/23/2019. Treatmentgoalhas been todecrease transfusion dependence and prevent acceleration of disease (blasts). Bone marrow on 11/27/2019 was similar to 08/12/2018. There was no increase in blasts. Monocyte count is 18%. Next-Gen myeloid disorder profile revealsASXL1G6461f15,EZH2splice site 11449-7N>P,YYFRTMite c.Y.111-7BVAsplice site c.P.014-1_030-1THYHOOILN,[id number]*56There were no abnormalities in FLT3, IDH1, IDH2, NPM1. [facility] was hypercellular (80%) with persistent morphologic dyspoiesis and 9% blasts/promonocytes by manual differential.  Re-review treatment options:   [place]    Not feasible given the distance.   Azacytidine/venetoclax.   Revlimid.   Best supportive care.  He has decided to continue best supportive care.  Hematocrit 24.3.  Hemoglobin 7.7.  MCV 88.0.  Patient wishes to receive a PRBC transfusion tomorrow.  Continue to monitor 3. Iron overload Ferritin811 on 03/07/2020. He has received37units of PRBCs to date (08/17/2017 -03/08/2020). Currently no plans for Jadenu secondary  to leukopenia. 4.   Transfuse 1 unit PRBCs tomorrow. 5.   RTC in 2 weeks for labs (CBC, type and screen) and likely transfusion the following day. 6.   RTC in 4 weeks for MD assessment,  labs (CBC with diff, CMP, ferritin, type and screen) and +/- transfusion the next day.  I discussed the assessment and treatment plan with the patient.  The patient was provided an opportunity to ask questions and all were answered.  The patient agreed with the plan and demonstrated an understanding of the instructions.  The patient was advised to call back if the symptoms worsen or if the condition fails to improve as anticipated.   Lequita Asal, MD, PhD    03/07/2020, 5:03 PM  I, Jacqualyn Posey, am acting as a Education administrator for [employer]. Mike Gip, MD.   I, Tannie Koskela C. Mike Gip, MD, have reviewed the above documentation for accuracy and completeness, and I agree with the above.

## 2020-03-20 ENCOUNTER — Other Ambulatory Visit: Payer: Self-pay | Admitting: *Deleted

## 2020-03-20 ENCOUNTER — Inpatient Hospital Stay: Payer: Medicare Other

## 2020-03-20 ENCOUNTER — Encounter: Payer: Self-pay | Admitting: Hematology and Oncology

## 2020-03-20 ENCOUNTER — Inpatient Hospital Stay (HOSPITAL_BASED_OUTPATIENT_CLINIC_OR_DEPARTMENT_OTHER): Payer: Medicare Other | Admitting: Hematology and Oncology

## 2020-03-20 ENCOUNTER — Other Ambulatory Visit: Payer: Self-pay | Admitting: Hematology and Oncology

## 2020-03-20 ENCOUNTER — Other Ambulatory Visit: Payer: Self-pay

## 2020-03-20 VITALS — BP 139/47 | HR 67 | Temp 97.0°F | Wt 178.0 lb

## 2020-03-20 DIAGNOSIS — D72819 Decreased white blood cell count, unspecified: Secondary | ICD-10-CM

## 2020-03-20 DIAGNOSIS — D469 Myelodysplastic syndrome, unspecified: Secondary | ICD-10-CM

## 2020-03-20 DIAGNOSIS — D649 Anemia, unspecified: Secondary | ICD-10-CM

## 2020-03-20 DIAGNOSIS — D4621 Refractory anemia with excess of blasts 1: Secondary | ICD-10-CM | POA: Diagnosis not present

## 2020-03-20 LAB — SAMPLE TO BLOOD BANK

## 2020-03-20 LAB — CBC WITH DIFFERENTIAL/PLATELET
Abs Immature Granulocytes: 0.03 10*3/uL (ref 0.00–0.07)
Basophils Absolute: 0 10*3/uL (ref 0.0–0.1)
Basophils Relative: 0 %
Eosinophils Absolute: 0 10*3/uL (ref 0.0–0.5)
Eosinophils Relative: 1 %
HCT: 24.3 % — ABNORMAL LOW (ref 39.0–52.0)
Hemoglobin: 7.7 g/dL — ABNORMAL LOW (ref 13.0–17.0)
Immature Granulocytes: 2 %
Lymphocytes Relative: 54 %
Lymphs Abs: 1 10*3/uL (ref 0.7–4.0)
MCH: 27.9 pg (ref 26.0–34.0)
MCHC: 31.7 g/dL (ref 30.0–36.0)
MCV: 88 fL (ref 80.0–100.0)
Monocytes Absolute: 0.1 10*3/uL (ref 0.1–1.0)
Monocytes Relative: 7 %
Neutro Abs: 0.6 10*3/uL — ABNORMAL LOW (ref 1.7–7.7)
Neutrophils Relative %: 36 %
Platelets: 161 10*3/uL (ref 150–400)
RBC: 2.76 MIL/uL — ABNORMAL LOW (ref 4.22–5.81)
RDW: 16.3 % — ABNORMAL HIGH (ref 11.5–15.5)
WBC: 1.8 10*3/uL — ABNORMAL LOW (ref 4.0–10.5)
nRBC: 0 % (ref 0.0–0.2)

## 2020-03-20 LAB — COMPREHENSIVE METABOLIC PANEL
ALT: 21 U/L (ref 0–44)
AST: 22 U/L (ref 15–41)
Albumin: 3.9 g/dL (ref 3.5–5.0)
Alkaline Phosphatase: 30 U/L — ABNORMAL LOW (ref 38–126)
Anion gap: 10 (ref 5–15)
BUN: 23 mg/dL (ref 8–23)
CO2: 25 mmol/L (ref 22–32)
Calcium: 8.7 mg/dL — ABNORMAL LOW (ref 8.9–10.3)
Chloride: 101 mmol/L (ref 98–111)
Creatinine, Ser: 1.07 mg/dL (ref 0.61–1.24)
GFR calc Af Amer: >60 mL/min (ref >60)
GFR calc non Af Amer: >60 mL/min (ref >60)
Glucose, Bld: 211 mg/dL — ABNORMAL HIGH (ref 70–99)
Potassium: 4.4 mmol/L (ref 3.5–5.1)
Sodium: 136 mmol/L (ref 135–145)
Total Bilirubin: 0.9 mg/dL (ref 0.3–1.2)
Total Protein: 7.1 g/dL (ref 6.5–8.1)

## 2020-03-20 LAB — PREPARE RBC (CROSSMATCH)

## 2020-03-20 NOTE — Patient Instructions (Signed)
  Continue neutropenic precautions.

## 2020-03-21 ENCOUNTER — Inpatient Hospital Stay: Payer: Medicare Other

## 2020-03-21 DIAGNOSIS — D469 Myelodysplastic syndrome, unspecified: Secondary | ICD-10-CM

## 2020-03-21 DIAGNOSIS — D649 Anemia, unspecified: Secondary | ICD-10-CM

## 2020-03-21 DIAGNOSIS — D4621 Refractory anemia with excess of blasts 1: Secondary | ICD-10-CM | POA: Diagnosis not present

## 2020-03-21 MED ORDER — SODIUM CHLORIDE 0.9% IV SOLUTION
250.0000 mL | Freq: Once | INTRAVENOUS | Status: AC
  Administered 2020-03-21: 250 mL via INTRAVENOUS
  Filled 2020-03-21: qty 250

## 2020-03-21 MED ORDER — ACETAMINOPHEN 325 MG PO TABS
650.0000 mg | ORAL_TABLET | Freq: Once | ORAL | Status: DC
  Filled 2020-03-21: qty 2

## 2020-03-21 MED ORDER — DIPHENHYDRAMINE HCL 25 MG PO CAPS
25.0000 mg | ORAL_CAPSULE | Freq: Once | ORAL | Status: DC
  Filled 2020-03-21: qty 1

## 2020-03-22 LAB — TYPE AND SCREEN
ABO/RH(D): B POS
Antibody Screen: NEGATIVE
Unit division: 0

## 2020-03-22 LAB — BPAM RBC
Blood Product Expiration Date: 202108052359
ISSUE DATE / TIME: 202107221026
Unit Type and Rh: 5100

## 2020-03-31 DEATH — deceased

## 2020-04-03 ENCOUNTER — Other Ambulatory Visit: Payer: Self-pay

## 2020-04-03 ENCOUNTER — Inpatient Hospital Stay: Payer: Medicare Other | Attending: Hematology and Oncology

## 2020-04-03 DIAGNOSIS — D4621 Refractory anemia with excess of blasts 1: Secondary | ICD-10-CM | POA: Insufficient documentation

## 2020-04-03 DIAGNOSIS — D469 Myelodysplastic syndrome, unspecified: Secondary | ICD-10-CM

## 2020-04-03 LAB — CBC WITH DIFFERENTIAL/PLATELET
Abs Immature Granulocytes: 0.13 10*3/uL — ABNORMAL HIGH (ref 0.00–0.07)
Basophils Absolute: 0 10*3/uL (ref 0.0–0.1)
Basophils Relative: 0 %
Eosinophils Absolute: 0 10*3/uL (ref 0.0–0.5)
Eosinophils Relative: 0 %
HCT: 23.1 % — ABNORMAL LOW (ref 39.0–52.0)
Hemoglobin: 7.3 g/dL — ABNORMAL LOW (ref 13.0–17.0)
Immature Granulocytes: 7 %
Lymphocytes Relative: 48 %
Lymphs Abs: 0.9 10*3/uL (ref 0.7–4.0)
MCH: 27.9 pg (ref 26.0–34.0)
MCHC: 31.6 g/dL (ref 30.0–36.0)
MCV: 88.2 fL (ref 80.0–100.0)
Monocytes Absolute: 0.2 10*3/uL (ref 0.1–1.0)
Monocytes Relative: 9 %
Neutro Abs: 0.7 10*3/uL — ABNORMAL LOW (ref 1.7–7.7)
Neutrophils Relative %: 36 %
Platelets: 133 10*3/uL — ABNORMAL LOW (ref 150–400)
RBC: 2.62 MIL/uL — ABNORMAL LOW (ref 4.22–5.81)
RDW: 15.8 % — ABNORMAL HIGH (ref 11.5–15.5)
WBC: 1.9 10*3/uL — ABNORMAL LOW (ref 4.0–10.5)
nRBC: 1 % — ABNORMAL HIGH (ref 0.0–0.2)

## 2020-04-04 ENCOUNTER — Other Ambulatory Visit: Payer: Self-pay | Admitting: Hematology and Oncology

## 2020-04-04 ENCOUNTER — Other Ambulatory Visit: Payer: Self-pay | Admitting: *Deleted

## 2020-04-04 ENCOUNTER — Inpatient Hospital Stay: Payer: Medicare Other

## 2020-04-04 DIAGNOSIS — D649 Anemia, unspecified: Secondary | ICD-10-CM

## 2020-04-04 DIAGNOSIS — D4621 Refractory anemia with excess of blasts 1: Secondary | ICD-10-CM | POA: Diagnosis not present

## 2020-04-04 LAB — SAMPLE TO BLOOD BANK

## 2020-04-04 LAB — PREPARE RBC (CROSSMATCH)

## 2020-04-04 MED ORDER — DIPHENHYDRAMINE HCL 25 MG PO CAPS
25.0000 mg | ORAL_CAPSULE | Freq: Once | ORAL | Status: AC
  Administered 2020-04-04: 25 mg via ORAL

## 2020-04-04 MED ORDER — ACETAMINOPHEN 325 MG PO TABS: 650.0000 mg | ORAL_TABLET | Freq: Once | ORAL | Status: DC

## 2020-04-04 MED ORDER — SODIUM CHLORIDE 0.9% IV SOLUTION
250.0000 mL | Freq: Once | INTRAVENOUS | Status: AC
  Administered 2020-04-04: 250 mL via INTRAVENOUS
  Filled 2020-04-04: qty 250

## 2020-04-05 LAB — TYPE AND SCREEN
ABO/RH(D): B POS
Antibody Screen: NEGATIVE
Unit division: 0

## 2020-04-05 LAB — BPAM RBC
Blood Product Expiration Date: 202108212359
ISSUE DATE / TIME: 202108051344
Unit Type and Rh: 5100

## 2020-04-15 NOTE — Progress Notes (Signed)
[facility]  [address] [place], [place] [address] Phone: [phone]  Fax: [phone]   Clinic Day:  04/17/2020   Referring physician: Tracie Harrier, MD  Chief Complaint: Paul Pham. is a 86 y.o. 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  HPI: The patient was last seen in the hematology clinic on 03/20/2020. At that time, he was doing fairly well. He was able to walk daily and do more chores. Exam was stable. Hematocrit was 24.3, hemoglobin 7.7, MCV 88.0, platelets 161,000, WBC 1,800 (ANC 600). Calcium was 8.7.   CBC on 04/03/2020 revealed a hematocrit of 23.1, hemoglobin 7.3, MCV 88.2, platelets 133,000, WBC 1,900 (ANC 700).  He received PRBC transfusions on 03/21/2020 and 04/04/2020.  During the interim, he has been "pretty good".  He walked 2 miles today with no problems. Denies fever, cough, shortness of breath, urinary symptoms, or chest pain. He has not needed to take Miralax lately. He states that his blood sugar is a little high because he ate a lot of sweets for his birthday. He would like a transfusion this week.   Past Medical History:  Diagnosis Date  . Anemia   . Arthritis   . BPH (benign prostatic hypertrophy)   . Cancer ([place])   . Complication of anesthesia    nausea  . Diabetes type 2, controlled ([place])   . HOH (hard of hearing)    r and L ears-70% loss per pt  . Hyperlipidemia   . Hypertension   . Myelodysplasia (myelodysplastic syndrome) ([place]) 2019    Past Surgical History:  Procedure Laterality Date  . APPENDECTOMY    . COLONOSCOPY  09/21/08   1 polyp found, tubular adenoma  . HAND SURGERY    . KNEE SURGERY Left     Family History  Problem Relation Age of Onset  . Aneurysm Mother   . Heart disease Father   . Cancer Brother        skin  . Heart disease Brother   . Heart disease Brother   . Cancer Sister        skin  . Aneurysm Brother   . Heart disease Brother   . Heart disease  Sister   . Heart disease Sister   . COPD Sister   . Diabetes Sister     Social History:  reports that he has never smoked. He has never used smokeless tobacco. He reports that he does not drink alcohol and does not use drugs. 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 Patient denies known exposures to radiation on toxins. 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 He had his 19-year marriage anniversary on 08/23/2018.He is walking 1-18mles per day.[place] The patient is accompanied by his wife today.   Allergies: No Known Allergies  Current Medications: Current Outpatient Medications  Medication Sig Dispense Refill  . aspirin 81 MG chewable tablet Chew 81 mg by mouth daily.    . Cranberry (THERACRAN PO) Take 1 tablet by mouth daily.     . cyanocobalamin 1000 MCG tablet Take 1,000 mcg by mouth daily.    . finasteride (PROSCAR) 5 MG tablet Take 5 mg by mouth daily.    .Marland Kitchenglimepiride (AMARYL) 1 MG tablet Take 1 tablet by mouth daily after breakfast.    . glucose blood test strip FreeStyle Lite Strips    . LANCETS ULTRA FINE MISC Lancets,Ultra Thin  26 gauge    . lisinopril (PRINIVIL,ZESTRIL) 10 MG tablet Take 10 mg by mouth daily.    . meclizine (ANTIVERT) 25 MG tablet Take 25 mg by mouth 2 (two) times daily as needed.     . metformin (FORTAMET) 1000 MG (OSM) 24 hr tablet Take 1,000 mg by mouth 2 (two) times daily with a meal.    . Multiple Vitamins-Minerals (CENTRUM SILVER PO) Take by mouth.    . mupirocin ointment (BACTROBAN) 2 % Apply 1 application topically daily. With dressing changes 22 g 0  . Omega-3 Fatty Acids (FISH OIL PO) Take 1 tablet by mouth daily.     . Polyethylene Glycol 3350 (MIRALAX PO) Take by mouth as needed.     [name] (PROSTATE SR PO) Take 1 tablet by mouth daily.     . simvastatin (ZOCOR) 40 MG tablet Take 40 mg by mouth daily.    Marland Kitchen loperamide (IMODIUM) 2 MG capsule Take 1  capsule (2 mg total) by mouth See admin instructions. With onset of loose stool, take 51m followed by 269mevery 2 hours until loose bowel movement stopped. Maximum: 16 mg/day (Patient not taking: Reported on 04/17/2020) 30 capsule 0  . ondansetron (ZOFRAN) 4 MG tablet Take 1 tablet (4 mg total) by mouth every 6 (six) hours as needed for nausea or vomiting. (Patient not taking: Reported on 04/17/2020) 30 tablet 2   No current facility-administered medications for this visit.   Facility-Administered Medications Ordered in Other Visits  Medication Dose Route Frequency Provider Last Rate Last Admin  . acetaminophen (TYLENOL) tablet 650 mg  650 mg Oral Once CoLequita AsalMD      . acetaminophen (TYLENOL) tablet 650 mg  650 mg Oral Once CoLequita AsalMD      . acetaminophen (TYLENOL) tablet 650 mg  650 mg Oral Once CoLequita AsalMD      . acetaminophen (TYLENOL) tablet 650 mg  650 mg Oral Once CoLequita AsalMD      . diphenhydrAMINE (BENADRYL) capsule 25 mg  25 mg Oral Once CoNolon Stalls, MD      . diphenhydrAMINE (BENADRYL) capsule 25 mg  25 mg Oral Once CoNolon Stalls, MD      . diphenhydrAMINE (BENADRYL) capsule 25 mg  25 mg Oral Once CoNolon Stalls, MD      . diphenhydrAMINE (BENADRYL) capsule 25 mg  25 mg Oral Once CoLequita AsalMD        Review of Systems  Constitutional: Negative for chills, diaphoresis, fever, malaise/fatigue and weight loss (stable).       Doing "pretty good".  HENT: Positive for hearing loss. Negative for congestion, ear discharge, ear pain, nosebleeds, sinus pain, sore throat and tinnitus.   Eyes: Negative.  Negative for blurred vision.  Respiratory: Negative.  Negative for cough, hemoptysis, sputum production and shortness of breath.   Cardiovascular: Negative.  Negative for chest pain, palpitations and leg swelling.  Gastrointestinal: Negative.  Negative for abdominal pain, blood in stool, constipation, diarrhea,  heartburn, melena, nausea and vomiting.  Genitourinary: Negative.  Negative for dysuria, frequency, hematuria and urgency.  Musculoskeletal: Negative.  Negative for back pain, joint pain, myalgias and neck pain.  Skin: Negative.  Negative for itching and rash.  Neurological: Negative.  Negative for dizziness, tingling, sensory change, weakness and headaches.  Endo/Heme/Allergies: Does not bruise/bleed easily.       Diabetes. Blood sugar high lately, ate sweets for birthday  Psychiatric/Behavioral: Negative.  Negative for depression and memory loss. The patient is not nervous/anxious and does not have insomnia.   All other systems reviewed and are negative.  Performance status (ECOG): 1  Vitals Blood pressure 131/68, pulse 71, temperature (!) 97.5 F (36.4 C), temperature source Tympanic, resp. rate 18, height '6\' 1"'  (1.854 m), weight 178 lb 14.5 oz (81.1 kg), SpO2 100 %.   Physical Exam Vitals and nursing note reviewed.  Constitutional:      General: He is not in acute distress.    Appearance: He is well-developed. He is not diaphoretic.  HENT:     Head: Normocephalic and atraumatic.     Comments: Male pattern baldness.  Short hair.    Mouth/Throat:     Mouth: Mucous membranes are moist.     Pharynx: No oropharyngeal exudate.  Eyes:     General: No scleral icterus.    Conjunctiva/sclera: Conjunctivae normal.     Pupils: Pupils are equal, round, and reactive to light.     Comments: Gold rimmed glasses.  Blue eyes.   Cardiovascular:     Rate and Rhythm: Normal rate and regular rhythm.     Heart sounds: Normal heart sounds. No murmur heard.   Pulmonary:     Effort: Pulmonary effort is normal. No respiratory distress.     Breath sounds: Normal breath sounds. No wheezing or rales.  Chest:     Chest wall: No tenderness.  Abdominal:     General: Bowel sounds are normal. There is no distension.     Palpations: Abdomen is soft. There is no hepatomegaly, splenomegaly or mass.      Tenderness: There is no abdominal tenderness. There is no guarding or rebound.  Musculoskeletal:        General: No swelling or tenderness. Normal range of motion.     Cervical back: Normal range of motion and neck supple.  Lymphadenopathy:     Head:     Right side of head: No preauricular, posterior auricular or occipital adenopathy.     Left side of head: No preauricular, posterior auricular or occipital adenopathy.     Cervical: No cervical adenopathy.     Upper Body:     Right upper body: No supraclavicular or axillary adenopathy.     Left upper body: No supraclavicular or axillary adenopathy.     Lower Body: No right inguinal adenopathy. No left inguinal adenopathy.  Skin:    General: Skin is warm and dry.  Neurological:     Mental Status: He is alert and oriented to person, place, and time.  Psychiatric:        Behavior: Behavior normal.        Thought Content: Thought content normal.        Judgment: Judgment normal.    Appointment on 04/17/2020  Component Date Value Ref Range Status  . Sodium 04/17/2020 135  135 - 145 mmol/L Final  . Potassium 04/17/2020 4.3  3.5 - 5.1 mmol/L Final  . Chloride 04/17/2020 103  98 - 111 mmol/L Final  . CO2 04/17/2020 24  22 - 32 mmol/L Final  . Glucose, Bld 04/17/2020 194* 70 - 99 mg/dL Final   Glucose reference range applies only to samples taken after fasting for at least 8 hours.  . BUN 04/17/2020 27* 8 - 23 mg/dL Final  . Creatinine, Ser 04/17/2020 1.07  0.61 - 1.24 mg/dL Final  . Calcium 04/17/2020 8.5* 8.9 - 10.3 mg/dL Final  . Total Protein 04/17/2020 7.0  6.5 -  8.1 g/dL Final  . Albumin 04/17/2020 3.9  3.5 - 5.0 g/dL Final  . AST 04/17/2020 23  15 - 41 U/L Final  . ALT 04/17/2020 21  0 - 44 U/L Final  . Alkaline Phosphatase 04/17/2020 30* 38 - 126 U/L Final  . Total Bilirubin 04/17/2020 0.5  0.3 - 1.2 mg/dL Final  . GFR calc non Af Amer 04/17/2020 60* >60 mL/min Final  . GFR calc Af Amer 04/17/2020 >60  >60 mL/min Final  .  Anion gap 04/17/2020 8  5 - 15 Final   Performed at [facility], [address], [place], [place] [address]  . WBC 04/17/2020 2.2* 4.0 - 10.5 K/uL Final  . RBC 04/17/2020 2.35* 4.22 - 5.81 MIL/uL Final  . Hemoglobin 04/17/2020 6.7* 13.0 - 17.0 g/dL Final  . HCT 04/17/2020 20.9* 39 - 52 % Final  . MCV 04/17/2020 88.9  80.0 - 100.0 fL Final  . MCH 04/17/2020 28.5  26.0 - 34.0 pg Final  . MCHC 04/17/2020 32.1  30.0 - 36.0 g/dL Final  . RDW 04/17/2020 15.7* 11.5 - 15.5 % Final  . Platelets 04/17/2020 130* 150 - 400 K/uL Final  . nRBC 04/17/2020 0.9* 0.0 - 0.2 % Final  . Neutrophils Relative % 04/17/2020 45  % Final  . Neutro Abs 04/17/2020 1.0* 1.7 - 7.7 K/uL Final  . Lymphocytes Relative 04/17/2020 45  % Final  . Lymphs Abs 04/17/2020 1.0  0.7 - 4.0 K/uL Final  . Monocytes Relative 04/17/2020 8  % Final  . Monocytes Absolute 04/17/2020 0.2  0 - 1 K/uL Final  . Eosinophils Relative 04/17/2020 0  % Final  . Eosinophils Absolute 04/17/2020 0.0  0 - 0 K/uL Final  . Basophils Relative 04/17/2020 0  % Final  . Basophils Absolute 04/17/2020 0.0  0 - 0 K/uL Final  . Immature Granulocytes 04/17/2020 2  % Final  . Abs Immature Granulocytes 04/17/2020 0.04  0.00 - 0.07 K/uL Final   Performed at [facility], [address], [place], [place] [address]    Assessment:  Paul Doro. is a 86 y.o. 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 Bone marrowon 09/22/2017 revealed a hypercellular for age with dyspoietic changes variably involving myeloid cell lines, but with main involvement of the granulocytic/monocytic cell line. This was associated with bone marrow monocytosis and borderline number to slight increase in blastic cells. The overall changes favor a primary myeloid neoplasm, particularly a myelodysplastic syndrome especially refractory anemia with excess blasts (RAEB-1) or possibly refractory cytopenia with multilineage dysplasia. Consideration was also given to an evolving  myelodysplastic/myeloproliferative neoplasm such as chronic myelomonocytic leukemia but the lack of absolute peripheral monocytosis precluded such a diagnosis at this time. Flow cytometrywas negative. Cytogeneticswere normal (46, XY). Foundation one was positive for ASXL1 Q947ML*46, EZH2 Splice site 503-5W>S, RUNX1 [id number]. IPSS-R4.5, intermediate group.  Anemia work-up on 08/17/2017: Vitamin B12 (658), folate (22.0), and TSH (2.688). Retic was 2.6%. Haptoglobin was 116 on 08/19/2017. Epo levelwas 104.8 on 10/05/2017.  Ferritin has been followed: 106 on 08/17/2017, 131 on 11/04/2017, 144 on 05/30/2018, 394 on 09/01/2018, 441 on 11/28/2018, 394 on 01/02/2019, 480 on 02/06/2019, 392 on 03/13/2019, 527 on 04/17/2019, 533 on 05/29/2019, 476 on 06/05/2019, 517 on 07/17/2019, 685 on 08/28/2019, 724 on 10/16/2019, 749 on 01/08/2020, 95 on 02/08/2020, 811 on 03/07/2020 and 1189 on 04/17/2020. Iron saturation was 34% on 08/17/2017 and 89% on 09/01/2018.  Bone marrowon 09/22/2017 revealed a hypercellular for age with dyspoietic changes variably involving myeloid  cell lines, but with main involvement of the granulocytic/monocytic cell line. This was associated with bone marrow monocytosis and borderline number to slight increase in blasts (5%). The overall changes favor a primary myeloid neoplasm, particularly a myelodysplastic syndrome especially refractory anemia with excess blasts (RAEB-1) or possibly refractory cytopenia with multilineage dysplasia. Consideration was also given to an evolving myelodysplastic/myeloproliferative neoplasm such as chronic myelomonocytic leukemia but the lack of absolute peripheral monocytosis precluded such a diagnosis at this time. Flow cytometrywas negative. Cytogeneticswere normal (46, XY). Foundation One was positive for ASXL1 J478GN*56, EZH2 Splice site 213-0Q>M, RUNX1 [id number].  He has received20cycles ofVidaza(11/04/2017 - 08/22/2018;  10/24/2018-10/23/2019). He has received Aranesp(initially 150 mcg every 2 weeks post chemotherapy then 300 mcg every 1-2 weeks after cycle #3; last 09/19/2018). He last received Aranesp on02/15/2021. He has received 1-2units of PRBCswith each cycle.He has received39units of PRBCs since 08/17/2017 (last 04/04/2020).  Bone marrowon 08/12/2018 revealed a hypercellular marrow with dyspoietic changes. There was a borderline number of blasts (5%) seen by morphology. The findings were similar to previous biopsy although the cellularity is slightly higher in the current material.consistent with previously known myelodysplastic syndrome. Flow cytometry was negative. Cytogenetics were normal (46, XY).  Bone marrowon 11/27/2019 revealed a hypercellular bone marrow (60-80%) with myelodysplastic syndrome. The morphologic features were similar to the previous biopsy. There was no definite increase in blasts. Flow cytometry revealed no significant CD34-positive blastic population, increase in monocytic cells (up to 18%), Tcells with nonspecific phenotypic changes, and no monoclonal B-cell population identified.Cytogenetics were normal (46, XY). FISH was negative. Next-Gen myeloid disorder profilerevealsASXL1G667f*15,EZH2splice site 1578-4O>N,GEXBMWsite cU.132-4MWN splice site cU.272-5_366-4QIHKVQQVZ,DGLO7F643P*29 There were no abnormalities in FLT3, IDH1, IDH2, NPM1.  [facility] revealed a hypercellular bone marrow (80%) with persistent morphologic dyspoiesis and 9% blasts/promonocytes by manual differential.  Hereceived the PEdomCOVID-19 vaccineon01/01/2020 and 09/27/2019.  Symptomatically, he feels "pretty good".  He is walking 2 miles a day.  He would like a transfusion.  Hemoglobin is 6.7.  Plan: 1.   Labs today: CBC with diff, CMP, ferritin, type and screen. 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  Helast received Vidazaon 10/23/2019. Treatmentgoalhas been todecrease transfusion dependence and prevent acceleration of disease (blasts). Bone marrow on 11/27/2019 was similar to 08/12/2018. There was no increase in blasts. Monocyte count is 18%. Next-Gen myeloid disorder profile [id number],EZH2splice site 11518-8C>Z,YSAYTKite c.Z.601-0XNAsplice site c.T.557-3_220-2RKYHCWCBJ,SEGB1D176H*60There were no abnormalities in FLT3, IDH1, IDH2, NPM1. [facility] was hypercellular (80%) with persistent morphologic dyspoiesis and 9% blasts/promonocytes by manual differential.  Patient receives supportive care alone.  Hematocrit 20.9.  Hemoglobin 6.7.  MCV 88.9 on 04/17/2020.  Discuss plans for transfusion tomorrow.  Continue to monitor. 3. Iron overload Ferritin1189 on 04/17/2020. He has received39units of PRBCs to date (08/17/2017 -04/04/2020). Plan to address Jadenu at next visit secondary to ferritin > 1000.   Concern for leukopenia. 4.   Transfuse 1 unit PRBCs tomorrow. 5.   RTC in 1 week for labs (CBC, type and screen), and likely transfusion the next day. 6.   RTC in 3 weeks for labs (CBC, type and screen) and likely transfusion the following day. 7.   RTC in 5 weeks for MD assessment, labs (CBC with diff, CMP, ferritin, type and screen) and +/- transfusion the next day.  I discussed the assessment and treatment plan with the patient.  The patient was provided an opportunity to  ask questions and all were answered.  The patient agreed with the plan and demonstrated an understanding of the instructions.  The patient was advised to call back if the symptoms worsen or if the condition fails to improve as anticipated.   Lequita Asal,  MD, PhD    03/07/2020, 5:03 PM  I, Mirian Mo Tufford, am acting as a Education administrator for [employer]. Mike Gip, MD.   I, Miracle Criado C. Mike Gip, MD, have reviewed the above documentation for accuracy and completeness, and I agree with the above.

## 2020-04-17 ENCOUNTER — Inpatient Hospital Stay (HOSPITAL_BASED_OUTPATIENT_CLINIC_OR_DEPARTMENT_OTHER): Payer: Medicare Other | Admitting: Hematology and Oncology

## 2020-04-17 ENCOUNTER — Inpatient Hospital Stay: Payer: Medicare Other

## 2020-04-17 ENCOUNTER — Encounter: Payer: Self-pay | Admitting: Hematology and Oncology

## 2020-04-17 ENCOUNTER — Other Ambulatory Visit: Payer: Self-pay

## 2020-04-17 ENCOUNTER — Other Ambulatory Visit: Payer: Self-pay | Admitting: Hematology and Oncology

## 2020-04-17 VITALS — BP 131/68 | HR 71 | Temp 97.5°F | Resp 18 | Ht 73.0 in | Wt 178.9 lb

## 2020-04-17 DIAGNOSIS — D469 Myelodysplastic syndrome, unspecified: Secondary | ICD-10-CM

## 2020-04-17 DIAGNOSIS — D649 Anemia, unspecified: Secondary | ICD-10-CM

## 2020-04-17 DIAGNOSIS — Z7189 Other specified counseling: Secondary | ICD-10-CM | POA: Diagnosis not present

## 2020-04-17 DIAGNOSIS — D4621 Refractory anemia with excess of blasts 1: Secondary | ICD-10-CM | POA: Diagnosis not present

## 2020-04-17 LAB — CBC WITH DIFFERENTIAL/PLATELET
Abs Immature Granulocytes: 0.04 10*3/uL (ref 0.00–0.07)
Basophils Absolute: 0 10*3/uL (ref 0.0–0.1)
Basophils Relative: 0 %
Eosinophils Absolute: 0 10*3/uL (ref 0.0–0.5)
Eosinophils Relative: 0 %
HCT: 20.9 % — ABNORMAL LOW (ref 39.0–52.0)
Hemoglobin: 6.7 g/dL — ABNORMAL LOW (ref 13.0–17.0)
Immature Granulocytes: 2 %
Lymphocytes Relative: 45 %
Lymphs Abs: 1 10*3/uL (ref 0.7–4.0)
MCH: 28.5 pg (ref 26.0–34.0)
MCHC: 32.1 g/dL (ref 30.0–36.0)
MCV: 88.9 fL (ref 80.0–100.0)
Monocytes Absolute: 0.2 10*3/uL (ref 0.1–1.0)
Monocytes Relative: 8 %
Neutro Abs: 1 10*3/uL — ABNORMAL LOW (ref 1.7–7.7)
Neutrophils Relative %: 45 %
Platelets: 130 10*3/uL — ABNORMAL LOW (ref 150–400)
RBC: 2.35 MIL/uL — ABNORMAL LOW (ref 4.22–5.81)
RDW: 15.7 % — ABNORMAL HIGH (ref 11.5–15.5)
WBC: 2.2 10*3/uL — ABNORMAL LOW (ref 4.0–10.5)
nRBC: 0.9 % — ABNORMAL HIGH (ref 0.0–0.2)

## 2020-04-17 LAB — COMPREHENSIVE METABOLIC PANEL
ALT: 21 U/L (ref 0–44)
AST: 23 U/L (ref 15–41)
Albumin: 3.9 g/dL (ref 3.5–5.0)
Alkaline Phosphatase: 30 U/L — ABNORMAL LOW (ref 38–126)
Anion gap: 8 (ref 5–15)
BUN: 27 mg/dL — ABNORMAL HIGH (ref 8–23)
CO2: 24 mmol/L (ref 22–32)
Calcium: 8.5 mg/dL — ABNORMAL LOW (ref 8.9–10.3)
Chloride: 103 mmol/L (ref 98–111)
Creatinine, Ser: 1.07 mg/dL (ref 0.61–1.24)
GFR calc Af Amer: >60 mL/min (ref >60)
GFR calc non Af Amer: 60 mL/min — ABNORMAL LOW (ref >60)
Glucose, Bld: 194 mg/dL — ABNORMAL HIGH (ref 70–99)
Potassium: 4.3 mmol/L (ref 3.5–5.1)
Sodium: 135 mmol/L (ref 135–145)
Total Bilirubin: 0.5 mg/dL (ref 0.3–1.2)
Total Protein: 7 g/dL (ref 6.5–8.1)

## 2020-04-17 LAB — FERRITIN: Ferritin: 1189 ng/mL — ABNORMAL HIGH (ref 24–336)

## 2020-04-17 LAB — SAMPLE TO BLOOD BANK

## 2020-04-17 NOTE — Progress Notes (Signed)
No new changes noted today 

## 2020-04-18 ENCOUNTER — Other Ambulatory Visit: Payer: Self-pay | Admitting: *Deleted

## 2020-04-18 ENCOUNTER — Other Ambulatory Visit: Payer: Self-pay

## 2020-04-18 ENCOUNTER — Inpatient Hospital Stay: Payer: Medicare Other

## 2020-04-18 ENCOUNTER — Ambulatory Visit: Payer: TRICARE For Life (TFL) | Admitting: Dermatology

## 2020-04-18 DIAGNOSIS — D469 Myelodysplastic syndrome, unspecified: Secondary | ICD-10-CM

## 2020-04-18 DIAGNOSIS — D649 Anemia, unspecified: Secondary | ICD-10-CM

## 2020-04-18 DIAGNOSIS — D4621 Refractory anemia with excess of blasts 1: Secondary | ICD-10-CM | POA: Diagnosis not present

## 2020-04-18 LAB — PREPARE RBC (CROSSMATCH)

## 2020-04-18 MED ORDER — ACETAMINOPHEN 325 MG PO TABS: 650.0000 mg | ORAL_TABLET | Freq: Once | ORAL | Status: DC

## 2020-04-18 MED ORDER — DIPHENHYDRAMINE HCL 25 MG PO CAPS: 25.0000 mg | ORAL_CAPSULE | Freq: Once | ORAL | Status: DC

## 2020-04-18 MED ORDER — SODIUM CHLORIDE 0.9% IV SOLUTION
250.0000 mL | Freq: Once | INTRAVENOUS | Status: AC
  Administered 2020-04-18: 250 mL via INTRAVENOUS
  Filled 2020-04-18: qty 250

## 2020-04-19 LAB — BPAM RBC
Blood Product Expiration Date: 202109122359
ISSUE DATE / TIME: 202108191352
Unit Type and Rh: 7300

## 2020-04-19 LAB — TYPE AND SCREEN
ABO/RH(D): B POS
Antibody Screen: NEGATIVE
Unit division: 0

## 2020-04-22 DIAGNOSIS — S8010XA Contusion of unspecified lower leg, initial encounter: Secondary | ICD-10-CM | POA: Insufficient documentation

## 2020-04-23 ENCOUNTER — Other Ambulatory Visit: Payer: Self-pay

## 2020-04-23 DIAGNOSIS — D469 Myelodysplastic syndrome, unspecified: Secondary | ICD-10-CM

## 2020-04-24 ENCOUNTER — Other Ambulatory Visit: Payer: Self-pay | Admitting: Hematology and Oncology

## 2020-04-24 ENCOUNTER — Other Ambulatory Visit: Payer: Self-pay

## 2020-04-24 ENCOUNTER — Telehealth: Payer: Self-pay

## 2020-04-24 ENCOUNTER — Inpatient Hospital Stay: Payer: Medicare Other

## 2020-04-24 DIAGNOSIS — D469 Myelodysplastic syndrome, unspecified: Secondary | ICD-10-CM

## 2020-04-24 DIAGNOSIS — D649 Anemia, unspecified: Secondary | ICD-10-CM

## 2020-04-24 DIAGNOSIS — D4621 Refractory anemia with excess of blasts 1: Secondary | ICD-10-CM | POA: Diagnosis not present

## 2020-04-24 LAB — CBC WITH DIFFERENTIAL/PLATELET
Abs Immature Granulocytes: 0.19 10*3/uL — ABNORMAL HIGH (ref 0.00–0.07)
Basophils Absolute: 0 10*3/uL (ref 0.0–0.1)
Basophils Relative: 0 %
Eosinophils Absolute: 0 10*3/uL (ref 0.0–0.5)
Eosinophils Relative: 0 %
HCT: 23.3 % — ABNORMAL LOW (ref 39.0–52.0)
Hemoglobin: 7.5 g/dL — ABNORMAL LOW (ref 13.0–17.0)
Immature Granulocytes: 8 %
Lymphocytes Relative: 42 %
Lymphs Abs: 1.1 10*3/uL (ref 0.7–4.0)
MCH: 28.6 pg (ref 26.0–34.0)
MCHC: 32.2 g/dL (ref 30.0–36.0)
MCV: 88.9 fL (ref 80.0–100.0)
Monocytes Absolute: 0.2 10*3/uL (ref 0.1–1.0)
Monocytes Relative: 9 %
Neutro Abs: 1 10*3/uL — ABNORMAL LOW (ref 1.7–7.7)
Neutrophils Relative %: 41 %
Platelets: 140 10*3/uL — ABNORMAL LOW (ref 150–400)
RBC: 2.62 MIL/uL — ABNORMAL LOW (ref 4.22–5.81)
RDW: 15.5 % (ref 11.5–15.5)
WBC: 2.6 10*3/uL — ABNORMAL LOW (ref 4.0–10.5)
nRBC: 0.8 % — ABNORMAL HIGH (ref 0.0–0.2)

## 2020-04-24 LAB — PREPARE RBC (CROSSMATCH)

## 2020-04-24 NOTE — Telephone Encounter (Signed)
Patient aware of hemoglobin level. Patient would like to have a blood transfusion tomorrow. He has no energy.

## 2020-04-25 ENCOUNTER — Inpatient Hospital Stay: Payer: Medicare Other

## 2020-04-25 DIAGNOSIS — D4621 Refractory anemia with excess of blasts 1: Secondary | ICD-10-CM | POA: Diagnosis not present

## 2020-04-25 DIAGNOSIS — D469 Myelodysplastic syndrome, unspecified: Secondary | ICD-10-CM

## 2020-04-25 DIAGNOSIS — D649 Anemia, unspecified: Secondary | ICD-10-CM

## 2020-04-25 LAB — SAMPLE TO BLOOD BANK

## 2020-04-25 MED ORDER — SODIUM CHLORIDE 0.9% IV SOLUTION
250.0000 mL | Freq: Once | INTRAVENOUS | Status: AC
  Administered 2020-04-25: 250 mL via INTRAVENOUS
  Filled 2020-04-25: qty 250

## 2020-04-25 MED ORDER — DIPHENHYDRAMINE HCL 25 MG PO CAPS: 25.0000 mg | ORAL_CAPSULE | Freq: Once | ORAL | Status: DC

## 2020-04-25 MED ORDER — ACETAMINOPHEN 325 MG PO TABS: 650.0000 mg | ORAL_TABLET | Freq: Once | ORAL | Status: DC

## 2020-04-26 LAB — TYPE AND SCREEN
ABO/RH(D): B POS
Antibody Screen: NEGATIVE
Unit division: 0

## 2020-04-26 LAB — BPAM RBC
Blood Product Expiration Date: 202109142359
ISSUE DATE / TIME: 202108261330
Unit Type and Rh: 9500

## 2020-04-30 ENCOUNTER — Other Ambulatory Visit: Payer: Self-pay

## 2020-04-30 DIAGNOSIS — D469 Myelodysplastic syndrome, unspecified: Secondary | ICD-10-CM

## 2020-05-08 ENCOUNTER — Other Ambulatory Visit: Payer: Self-pay | Admitting: Hematology and Oncology

## 2020-05-08 ENCOUNTER — Other Ambulatory Visit: Payer: Self-pay | Admitting: *Deleted

## 2020-05-08 ENCOUNTER — Inpatient Hospital Stay: Payer: Medicare Other | Attending: Hematology and Oncology

## 2020-05-08 ENCOUNTER — Other Ambulatory Visit: Payer: Self-pay

## 2020-05-08 DIAGNOSIS — D469 Myelodysplastic syndrome, unspecified: Secondary | ICD-10-CM

## 2020-05-08 DIAGNOSIS — R5383 Other fatigue: Secondary | ICD-10-CM | POA: Diagnosis not present

## 2020-05-08 DIAGNOSIS — D649 Anemia, unspecified: Secondary | ICD-10-CM

## 2020-05-08 DIAGNOSIS — D4621 Refractory anemia with excess of blasts 1: Secondary | ICD-10-CM | POA: Insufficient documentation

## 2020-05-08 LAB — SAMPLE TO BLOOD BANK

## 2020-05-08 LAB — CBC WITH DIFFERENTIAL/PLATELET
Abs Immature Granulocytes: 0 10*3/uL (ref 0.00–0.07)
Basophils Absolute: 0 10*3/uL (ref 0.0–0.1)
Basophils Relative: 0 %
Eosinophils Absolute: 0 10*3/uL (ref 0.0–0.5)
Eosinophils Relative: 0 %
HCT: 20.9 % — ABNORMAL LOW (ref 39.0–52.0)
Hemoglobin: 6.8 g/dL — ABNORMAL LOW (ref 13.0–17.0)
Lymphocytes Relative: 55 %
Lymphs Abs: 1.2 10*3/uL (ref 0.7–4.0)
MCH: 29.2 pg (ref 26.0–34.0)
MCHC: 32.5 g/dL (ref 30.0–36.0)
MCV: 89.7 fL (ref 80.0–100.0)
Metamyelocytes Relative: 1 %
Monocytes Absolute: 0.1 10*3/uL (ref 0.1–1.0)
Monocytes Relative: 6 %
Neutro Abs: 0.8 10*3/uL — ABNORMAL LOW (ref 1.7–7.7)
Neutrophils Relative %: 38 %
Platelets: 135 10*3/uL — ABNORMAL LOW (ref 150–400)
RBC: 2.33 MIL/uL — ABNORMAL LOW (ref 4.22–5.81)
RDW: 15.8 % — ABNORMAL HIGH (ref 11.5–15.5)
WBC: 2.2 10*3/uL — ABNORMAL LOW (ref 4.0–10.5)
nRBC: 1.4 % — ABNORMAL HIGH (ref 0.0–0.2)

## 2020-05-09 ENCOUNTER — Inpatient Hospital Stay: Payer: Medicare Other

## 2020-05-09 DIAGNOSIS — D4621 Refractory anemia with excess of blasts 1: Secondary | ICD-10-CM | POA: Diagnosis not present

## 2020-05-09 DIAGNOSIS — D649 Anemia, unspecified: Secondary | ICD-10-CM

## 2020-05-09 DIAGNOSIS — D469 Myelodysplastic syndrome, unspecified: Secondary | ICD-10-CM

## 2020-05-09 LAB — PREPARE RBC (CROSSMATCH)

## 2020-05-09 MED ORDER — ACETAMINOPHEN 325 MG PO TABS: 650.0000 mg | ORAL_TABLET | Freq: Once | ORAL | Status: DC

## 2020-05-09 MED ORDER — SODIUM CHLORIDE 0.9% IV SOLUTION
250.0000 mL | Freq: Once | INTRAVENOUS | Status: AC
  Administered 2020-05-09: 250 mL via INTRAVENOUS
  Filled 2020-05-09: qty 250

## 2020-05-09 MED ORDER — DIPHENHYDRAMINE HCL 25 MG PO CAPS: 25.0000 mg | ORAL_CAPSULE | Freq: Once | ORAL | Status: DC

## 2020-05-10 LAB — TYPE AND SCREEN
ABO/RH(D): B POS
Antibody Screen: NEGATIVE
Unit division: 0
Unit division: 0

## 2020-05-10 LAB — BPAM RBC
Blood Product Expiration Date: 202109232359
Blood Product Expiration Date: 202109232359
ISSUE DATE / TIME: 202109090954
ISSUE DATE / TIME: 202109091202
Unit Type and Rh: 7300
Unit Type and Rh: 9500

## 2020-05-21 ENCOUNTER — Other Ambulatory Visit: Payer: Self-pay

## 2020-05-21 DIAGNOSIS — D469 Myelodysplastic syndrome, unspecified: Secondary | ICD-10-CM

## 2020-05-21 DIAGNOSIS — D649 Anemia, unspecified: Secondary | ICD-10-CM

## 2020-05-21 DIAGNOSIS — D708 Other neutropenia: Secondary | ICD-10-CM

## 2020-05-21 NOTE — Progress Notes (Signed)
[facility]  [address] [place], [place] [address] Phone: [phone]  Fax: [phone]   Clinic Day:  05/22/2020   Referring physician: Tracie Harrier, MD  Chief Complaint: Paul Pham. is a 86 y.o. 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  HPI: The patient was last seen in the hematology clinic on 04/17/2020. At that time, he felt "pretty good".  He was walking 2 miles a day.  He denies any complaints.    He has received 1 unit of PRBCs every 1-2 weeks.  He received 1 unit PRBCs on 04/18/2020, 04/25/2020, and 05/09/2020.  Hemoglobin was 6.7, 7.5 and 6.8 respectively.  During the interim, he has been feeling weaker. He notes that he has been feeling fatigued. He would like a transfusion this week. He states that after a transfusion, he states that he feels better for about a week to a week and a half.   He is interested in getting the COVID19 booster shot.    Past Medical History:  Diagnosis Date  . Anemia   . Arthritis   . BPH (benign prostatic hypertrophy)   . Cancer ([place])   . Complication of anesthesia    nausea  . Diabetes type 2, controlled ([place])   . HOH (hard of hearing)    r and L ears-70% loss per pt  . Hyperlipidemia   . Hypertension   . Myelodysplasia (myelodysplastic syndrome) ([place]) 2019    Past Surgical History:  Procedure Laterality Date  . APPENDECTOMY    . COLONOSCOPY  09/21/08   1 polyp found, tubular adenoma  . HAND SURGERY    . KNEE SURGERY Left     Family History  Problem Relation Age of Onset  . Aneurysm Mother   . Heart disease Father   . Cancer Brother        skin  . Heart disease Brother   . Heart disease Brother   . Cancer Sister        skin  . Aneurysm Brother   . Heart disease Brother   . Heart disease Sister   . Heart disease Sister   . COPD Sister   . Diabetes Sister     Social History:  reports that he has never smoked. He has never used smokeless tobacco. He  reports that he does not drink alcohol and does not use drugs. 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 Patient denies known exposures to radiation on toxins. 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 He had his 69-year marriage anniversary on 08/23/2018.He is walking 1-71mles per day.[place] The patient is accompanied by his wife today.   Allergies: No Known Allergies  Current Medications: Current Outpatient Medications  Medication Sig Dispense Refill  . aspirin 81 MG chewable tablet Chew 81 mg by mouth daily.    . Cranberry (THERACRAN PO) Take 1 tablet by mouth daily.     . cyanocobalamin 1000 MCG tablet Take 1,000 mcg by mouth daily.    . finasteride (PROSCAR) 5 MG tablet Take 5 mg by mouth daily.    .Marland Kitchenglimepiride (AMARYL) 1 MG tablet Take 1 tablet by mouth daily after breakfast.    . glucose blood test strip FreeStyle Lite Strips    . LANCETS ULTRA FINE MISC Lancets,Ultra Thin 26 gauge    . lisinopril (PRINIVIL,ZESTRIL) 10 MG tablet Take 10 mg by mouth daily.    .Marland Kitchen  meclizine (ANTIVERT) 25 MG tablet Take 25 mg by mouth 2 (two) times daily as needed.     . metformin (FORTAMET) 1000 MG (OSM) 24 hr tablet Take 1,000 mg by mouth 2 (two) times daily with a meal.    . Multiple Vitamins-Minerals (CENTRUM SILVER PO) Take by mouth.    . mupirocin ointment (BACTROBAN) 2 % Apply 1 application topically daily. With dressing changes 22 g 0  . Omega-3 Fatty Acids (FISH OIL PO) Take 1 tablet by mouth daily.     . Polyethylene Glycol 3350 (MIRALAX PO) Take by mouth as needed.     [name] (PROSTATE SR PO) Take 1 tablet by mouth daily.     . simvastatin (ZOCOR) 40 MG tablet Take 40 mg by mouth daily.    Marland Kitchen loperamide (IMODIUM) 2 MG capsule Take 1 capsule (2 mg total) by mouth See admin instructions. With onset of loose stool, take 38m followed by 248mevery 2 hours until loose bowel movement stopped. Maximum: 16 mg/day  (Patient not taking: Reported on 04/17/2020) 30 capsule 0  . ondansetron (ZOFRAN) 4 MG tablet Take 1 tablet (4 mg total) by mouth every 6 (six) hours as needed for nausea or vomiting. (Patient not taking: Reported on 04/17/2020) 30 tablet 2   No current facility-administered medications for this visit.   Facility-Administered Medications Ordered in Other Visits  Medication Dose Route Frequency Provider Last Rate Last Admin  . acetaminophen (TYLENOL) tablet 650 mg  650 mg Oral Once CoLequita AsalMD      . acetaminophen (TYLENOL) tablet 650 mg  650 mg Oral Once CoLequita AsalMD      . acetaminophen (TYLENOL) tablet 650 mg  650 mg Oral Once CoLequita AsalMD      . acetaminophen (TYLENOL) tablet 650 mg  650 mg Oral Once CoLequita AsalMD      . diphenhydrAMINE (BENADRYL) capsule 25 mg  25 mg Oral Once CoNolon Stalls, MD      . diphenhydrAMINE (BENADRYL) capsule 25 mg  25 mg Oral Once CoNolon Stalls, MD      . diphenhydrAMINE (BENADRYL) capsule 25 mg  25 mg Oral Once CoNolon Stalls, MD      . diphenhydrAMINE (BENADRYL) capsule 25 mg  25 mg Oral Once CoLequita AsalMD        Review of Systems  Constitutional: Positive for malaise/fatigue. Negative for chills, diaphoresis, fever and weight loss (stable).       Feeling "weaker".  Little "slower".  HENT: Positive for hearing loss. Negative for congestion, ear discharge, ear pain, nosebleeds, sinus pain, sore throat and tinnitus.   Eyes: Negative.  Negative for blurred vision.  Respiratory: Negative.  Negative for cough, hemoptysis, sputum production and shortness of breath.   Cardiovascular: Negative.  Negative for chest pain, palpitations and leg swelling.  Gastrointestinal: Negative.  Negative for abdominal pain, blood in stool, constipation, diarrhea, heartburn, melena, nausea and vomiting.  Genitourinary: Negative.  Negative for dysuria, frequency, hematuria and urgency.  Musculoskeletal: Negative.   Negative for back pain, joint pain, myalgias and neck pain.  Skin: Negative.  Negative for itching and rash.  Neurological: Negative.  Negative for dizziness, tingling, sensory change, weakness and headaches.  Endo/Heme/Allergies: Does not bruise/bleed easily.       Diabetes.  Psychiatric/Behavioral: Negative.  Negative for depression and memory loss. The patient is not nervous/anxious and does not have insomnia.   All other systems reviewed and are  negative.  Performance status (ECOG): 1  Vitals Blood pressure 140/64, pulse 80, temperature 97.9 F (36.6 C), temperature source Tympanic, resp. rate 18, height '6\' 1"'  (1.854 m), weight 178 lb 10.9 oz (81.1 kg), SpO2 100 %.   Physical Exam Vitals and nursing note reviewed.  Constitutional:      General: He is not in acute distress.    Appearance: He is well-developed. He is not diaphoretic.  HENT:     Head: Normocephalic and atraumatic.     Comments: Male pattern baldness.  Short hair.    Mouth/Throat:     Mouth: Mucous membranes are moist.     Pharynx: No oropharyngeal exudate.  Eyes:     General: No scleral icterus.    Conjunctiva/sclera: Conjunctivae normal.     Pupils: Pupils are equal, round, and reactive to light.     Comments: Gold rimmed glasses.  Blue eyes.   Cardiovascular:     Rate and Rhythm: Normal rate and regular rhythm.     Heart sounds: Normal heart sounds. No murmur heard.   Pulmonary:     Effort: Pulmonary effort is normal. No respiratory distress.     Breath sounds: Normal breath sounds. No wheezing or rales.  Chest:     Chest wall: No tenderness.  Abdominal:     General: Bowel sounds are normal. There is no distension.     Palpations: Abdomen is soft. There is no hepatomegaly, splenomegaly or mass.     Tenderness: There is no abdominal tenderness. There is no guarding or rebound.  Musculoskeletal:        General: No swelling or tenderness. Normal range of motion.     Cervical back: Normal range of motion  and neck supple.  Lymphadenopathy:     Head:     Right side of head: No preauricular, posterior auricular or occipital adenopathy.     Left side of head: No preauricular, posterior auricular or occipital adenopathy.     Cervical: No cervical adenopathy.     Upper Body:     Right upper body: No supraclavicular or axillary adenopathy.     Left upper body: No supraclavicular or axillary adenopathy.     Lower Body: No right inguinal adenopathy. No left inguinal adenopathy.  Skin:    General: Skin is warm and dry.  Neurological:     Mental Status: He is alert and oriented to person, place, and time.  Psychiatric:        Behavior: Behavior normal.        Thought Content: Thought content normal.        Judgment: Judgment normal.    Orders Only on 05/22/2020  Component Date Value Ref Range Status  . Order Confirmation 05/22/2020    Final                   Value:ORDER PROCESSED BY BLOOD BANK Performed at [facility], [address] [place], [place] [address]   Appointment on 05/22/2020  Component Date Value Ref Range Status  . ABO/RH(D) 05/22/2020 B POS   Final  . Antibody Screen 05/22/2020 NEG   Final  . Sample Expiration 05/22/2020 05/25/2020,2359   Final  . Unit Number 05/22/2020 [id number]   Final  . Blood Component Type 05/22/2020 RBC, LR IRR   Final  . Unit division 05/22/2020 00   Final  . Status of Unit 05/22/2020 ISSUED,FINAL   Final  . Transfusion Status 05/22/2020 OK TO TRANSFUSE   Final  .  Crossmatch Result 05/22/2020    Final                   Value:Compatible Performed at [facility], [address] [place], [place] [address]   . Ferritin 05/22/2020 1,203* 24 - 336 ng/mL Final   Performed at [facility], [address] [place], [place] [address]  . Sodium 05/22/2020 133* 135 - 145 mmol/L Final  . Potassium 05/22/2020 4.1  3.5 - 5.1 mmol/L Final  . Chloride 05/22/2020 100  98 - 111 mmol/L Final  . CO2 05/22/2020 24  22 - 32  mmol/L Final  . Glucose, Bld 05/22/2020 199* 70 - 99 mg/dL Final   Glucose reference range applies only to samples taken after fasting for at least 8 hours.  . BUN 05/22/2020 26* 8 - 23 mg/dL Final  . Creatinine, Ser 05/22/2020 0.98  0.61 - 1.24 mg/dL Final  . Calcium 05/22/2020 8.7* 8.9 - 10.3 mg/dL Final  . Total Protein 05/22/2020 7.0  6.5 - 8.1 g/dL Final  . Albumin 05/22/2020 4.1  3.5 - 5.0 g/dL Final  . AST 05/22/2020 25  15 - 41 U/L Final  . ALT 05/22/2020 23  0 - 44 U/L Final  . Alkaline Phosphatase 05/22/2020 31* 38 - 126 U/L Final  . Total Bilirubin 05/22/2020 0.9  0.3 - 1.2 mg/dL Final  . GFR calc non Af Amer 05/22/2020 >60  >60 mL/min Final  . GFR calc Af Amer 05/22/2020 >60  >60 mL/min Final  . Anion gap 05/22/2020 9  5 - 15 Final   Performed at [facility], [address] [place], [place] [address]  . WBC 05/22/2020 2.3* 4.0 - 10.5 K/uL Final  . RBC 05/22/2020 2.59* 4.22 - 5.81 MIL/uL Final  . Hemoglobin 05/22/2020 7.6* 13.0 - 17.0 g/dL Final  . HCT 05/22/2020 23.5* 39 - 52 % Final  . MCV 05/22/2020 90.7  80.0 - 100.0 fL Final  . MCH 05/22/2020 29.3  26.0 - 34.0 pg Final  . MCHC 05/22/2020 32.3  30.0 - 36.0 g/dL Final  . RDW 05/22/2020 14.3  11.5 - 15.5 % Final  . Platelets 05/22/2020 151  150 - 400 K/uL Final   Comment: Immature Platelet Fraction may be clinically indicated, consider ordering this additional test [id number]   . nRBC 05/22/2020 0.0  0.0 - 0.2 % Final  . Neutrophils Relative % 05/22/2020 42  % Final  . Neutro Abs 05/22/2020 1.0* 1.7 - 7.7 K/uL Final  . Lymphocytes Relative 05/22/2020 47  % Final  . Lymphs Abs 05/22/2020 1.1  0.7 - 4.0 K/uL Final  . Monocytes Relative 05/22/2020 9  % Final  . Monocytes Absolute 05/22/2020 0.2  0 - 1 K/uL Final  . Eosinophils Relative 05/22/2020 0  % Final  . Eosinophils Absolute 05/22/2020 0.0  0 - 0 K/uL Final  . Basophils Relative 05/22/2020 0  % Final  . Basophils Absolute 05/22/2020 0.0  0 - 0 K/uL  Final  . Immature Granulocytes 05/22/2020 2  % Final  . Abs Immature Granulocytes 05/22/2020 0.05  0.00 - 0.07 K/uL Final   Performed at [facility], [address] [place], [place] [address]  . ISSUE DATE / TIME 05/22/2020 [id number]   Final  . Blood Product Unit Number 05/22/2020 [id number]   Final  . PRODUCT CODE 05/22/2020 [id number]   Final  . Unit Type and Rh 05/22/2020 7300   Final  . Blood Product Expiration Date  05/22/2020 [id number]   Final    Assessment:  Fabiano Ginley. is a 86 y.o. 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 Bone marrowon 09/22/2017 revealed a hypercellular for age with dyspoietic changes variably involving myeloid cell lines, but with main involvement of the granulocytic/monocytic cell line. This was associated with bone marrow monocytosis and borderline number to slight increase in blastic cells. The overall changes favor a primary myeloid neoplasm, particularly a myelodysplastic syndrome especially refractory anemia with excess blasts (RAEB-1) or possibly refractory cytopenia with multilineage dysplasia. Consideration was also given to an evolving myelodysplastic/myeloproliferative neoplasm such as chronic myelomonocytic leukemia but the lack of absolute peripheral monocytosis precluded such a diagnosis at this time. Flow cytometrywas negative. Cytogeneticswere normal (46, XY). Foundation one was positive for ASXL1 N562ZH*08, EZH2 Splice site 657-8I>O, RUNX1 [id number]. IPSS-R4.5, intermediate group.  Anemia work-up on 08/17/2017: Vitamin B12 (658), folate (22.0), and TSH (2.688). Retic was 2.6%. Haptoglobin was 116 on 08/19/2017. Epo levelwas 104.8 on 10/05/2017.  Ferritin has been followed: 106 on 08/17/2017, 131 on 11/04/2017, 144 on 05/30/2018, 394 on 09/01/2018, 441 on 11/28/2018, 394 on 01/02/2019, 480 on 02/06/2019, 392 on 03/13/2019, 527 on 04/17/2019, 533 on 05/29/2019, 476 on 06/05/2019, 517 on 07/17/2019, 685 on 08/28/2019, 724 on 10/16/2019, 749 on  01/08/2020, 95 on 02/08/2020, 811 on 03/07/2020, 1189 on 04/17/2020, and 1203 on 05/22/2020. Iron saturation was 34% on 08/17/2017 and 89% on 09/01/2018.  Bone marrowon 09/22/2017 revealed a hypercellular for age with dyspoietic changes variably involving myeloid cell lines, but with main involvement of the granulocytic/monocytic cell line. This was associated with bone marrow monocytosis and borderline number to slight increase in blasts (5%). The overall changes favor a primary myeloid neoplasm, particularly a myelodysplastic syndrome especially refractory anemia with excess blasts (RAEB-1) or possibly refractory cytopenia with multilineage dysplasia. Consideration was also given to an evolving myelodysplastic/myeloproliferative neoplasm such as chronic myelomonocytic leukemia but the lack of absolute peripheral monocytosis precluded such a diagnosis at this time. Flow cytometrywas negative. Cytogeneticswere normal (46, XY). Foundation One was positive for ASXL1 N629BM*84, EZH2 Splice site 132-4M>W, RUNX1 [id number].  He has received20cycles ofVidaza(11/04/2017 - 08/22/2018; 10/24/2018-10/23/2019). He has received Aranesp(initially 150 mcg every 2 weeks post chemotherapy then 300 mcg every 1-2 weeks after cycle #3; last 09/19/2018). He last received Aranesp on02/15/2021. He has received 1-2units of PRBCswith each cycle.He has received42units of PRBCs since 08/17/2017 (last 05/09/2020).  Bone marrowon 08/12/2018 revealed a hypercellular marrow with dyspoietic changes. There was a borderline number of blasts (5%) seen by morphology. The findings were similar to previous biopsy although the cellularity is slightly higher in the current material.consistent with previously known myelodysplastic syndrome. Flow cytometry was negative. Cytogenetics were normal (46, XY).  Bone marrowon 11/27/2019 revealed a hypercellular bone marrow (60-80%) with myelodysplastic syndrome. The  morphologic features were similar to the previous biopsy. There was no definite increase in blasts. Flow cytometry revealed no significant CD34-positive blastic population, increase in monocytic cells (up to 18%), Tcells with nonspecific phenotypic changes, and no monoclonal B-cell population identified.Cytogenetics were normal (46, XY). FISH was negative. Next-Gen myeloid disorder profilerevealsASXL1G639f*15,EZH2splice site 1102-7O>Z,DGUYQIsite cH.474-2VZD splice site cG.387-5_[id number],SAYT0Z601U*93 There were no abnormalities in FLT3, IDH1, IDH2, NPM1.  [facility] revealed a hypercellular bone marrow (80%) with persistent morphologic dyspoiesis and 9% blasts/promonocytes by manual differential.  [place] [facility] vaccineon[facility]01[place]2020 and 09/27/2019.  Symptomatically, he feels fatigued.  He has had no interval infections.  He requires transfusion every 1-2 weeks.  Exam is stable.  Plan: 1.  Labs today: CBC with diff, CMP, ferritin, type and screen. 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  Helast received Vidazaon 10/23/2019. Treatmentgoalwas todecrease transfusion dependence and prevent acceleration of disease (blasts). [facility] on 02/15/2020 was hypercellular (80%) with persistent morphologic dyspoiesis and 9% blasts/promonocytes by manual differential.  He continues supportive care alone.  Hematocrit 23.5.  Hemoglobin 7.6.  MCV 90.7 today.  Schedule transfusion for tomorrow. 3. Iron overload Ferritin1203 today. He has received 42units of PRBCs to date (08/17/2017 -05/09/2020). Discuss consideration of Jadenu.   Concern for potential leukopenia and increased risk of infections.   Concern for potential renal insufficieny.   Information provided.  Patient considering. 4.    RTC tomorrow for 1 unit PRBCs. 5.   Patient considering oral chelation therapy. 6.   RTC in 10 days for labs (CBC, type and screen) and +/- PRBC tx next day. 7.   RTC in 20 days for labs (CBC, type and screen) and +/- PRBC tx next day. 8.   RTC in 30 days for MD assessment, labs (CBC with diff, CMP, ferritin, type and screen) and +/- transfusion the next day.  I discussed the assessment and treatment plan with the patient.  The patient was provided an opportunity to ask questions and all were answered.  The patient agreed with the plan and demonstrated an understanding of the instructions.  The patient was advised to call back if the symptoms worsen or if the condition fails to improve as anticipated.   Lequita Asal, MD, PhD    05/22/2020, 5:30PM  I, Jacqualyn Posey, am acting as a Education administrator for [employer]. Mike Gip, MD.   I, Jinx Gilden C. Mike Gip, MD, have reviewed the above documentation for accuracy and completeness, and I agree with the above.

## 2020-05-22 ENCOUNTER — Other Ambulatory Visit: Payer: Self-pay | Admitting: Hematology and Oncology

## 2020-05-22 ENCOUNTER — Encounter: Payer: Self-pay | Admitting: Hematology and Oncology

## 2020-05-22 ENCOUNTER — Inpatient Hospital Stay (HOSPITAL_BASED_OUTPATIENT_CLINIC_OR_DEPARTMENT_OTHER): Payer: Medicare Other | Admitting: Hematology and Oncology

## 2020-05-22 ENCOUNTER — Inpatient Hospital Stay: Payer: Medicare Other

## 2020-05-22 ENCOUNTER — Other Ambulatory Visit: Payer: Self-pay

## 2020-05-22 VITALS — BP 140/64 | HR 80 | Temp 97.9°F | Resp 18 | Ht 73.0 in | Wt 178.7 lb

## 2020-05-22 DIAGNOSIS — D649 Anemia, unspecified: Secondary | ICD-10-CM

## 2020-05-22 DIAGNOSIS — D469 Myelodysplastic syndrome, unspecified: Secondary | ICD-10-CM | POA: Diagnosis not present

## 2020-05-22 DIAGNOSIS — D4621 Refractory anemia with excess of blasts 1: Secondary | ICD-10-CM | POA: Diagnosis not present

## 2020-05-22 DIAGNOSIS — D708 Other neutropenia: Secondary | ICD-10-CM

## 2020-05-22 DIAGNOSIS — Z7189 Other specified counseling: Secondary | ICD-10-CM | POA: Diagnosis not present

## 2020-05-22 LAB — CBC WITH DIFFERENTIAL/PLATELET
Abs Immature Granulocytes: 0.05 10*3/uL (ref 0.00–0.07)
Basophils Absolute: 0 10*3/uL (ref 0.0–0.1)
Basophils Relative: 0 %
Eosinophils Absolute: 0 10*3/uL (ref 0.0–0.5)
Eosinophils Relative: 0 %
HCT: 23.5 % — ABNORMAL LOW (ref 39.0–52.0)
Hemoglobin: 7.6 g/dL — ABNORMAL LOW (ref 13.0–17.0)
Immature Granulocytes: 2 %
Lymphocytes Relative: 47 %
Lymphs Abs: 1.1 10*3/uL (ref 0.7–4.0)
MCH: 29.3 pg (ref 26.0–34.0)
MCHC: 32.3 g/dL (ref 30.0–36.0)
MCV: 90.7 fL (ref 80.0–100.0)
Monocytes Absolute: 0.2 10*3/uL (ref 0.1–1.0)
Monocytes Relative: 9 %
Neutro Abs: 1 10*3/uL — ABNORMAL LOW (ref 1.7–7.7)
Neutrophils Relative %: 42 %
Platelets: 151 10*3/uL (ref 150–400)
RBC: 2.59 MIL/uL — ABNORMAL LOW (ref 4.22–5.81)
RDW: 14.3 % (ref 11.5–15.5)
WBC: 2.3 10*3/uL — ABNORMAL LOW (ref 4.0–10.5)
nRBC: 0 % (ref 0.0–0.2)

## 2020-05-22 LAB — COMPREHENSIVE METABOLIC PANEL
ALT: 23 U/L (ref 0–44)
AST: 25 U/L (ref 15–41)
Albumin: 4.1 g/dL (ref 3.5–5.0)
Alkaline Phosphatase: 31 U/L — ABNORMAL LOW (ref 38–126)
Anion gap: 9 (ref 5–15)
BUN: 26 mg/dL — ABNORMAL HIGH (ref 8–23)
CO2: 24 mmol/L (ref 22–32)
Calcium: 8.7 mg/dL — ABNORMAL LOW (ref 8.9–10.3)
Chloride: 100 mmol/L (ref 98–111)
Creatinine, Ser: 0.98 mg/dL (ref 0.61–1.24)
GFR calc Af Amer: >60 mL/min (ref >60)
GFR calc non Af Amer: >60 mL/min (ref >60)
Glucose, Bld: 199 mg/dL — ABNORMAL HIGH (ref 70–99)
Potassium: 4.1 mmol/L (ref 3.5–5.1)
Sodium: 133 mmol/L — ABNORMAL LOW (ref 135–145)
Total Bilirubin: 0.9 mg/dL (ref 0.3–1.2)
Total Protein: 7 g/dL (ref 6.5–8.1)

## 2020-05-22 LAB — PREPARE RBC (CROSSMATCH)

## 2020-05-22 LAB — FERRITIN: Ferritin: 1203 ng/mL — ABNORMAL HIGH (ref 24–336)

## 2020-05-22 NOTE — Progress Notes (Signed)
No new changes noted today 

## 2020-05-22 NOTE — Patient Instructions (Signed)
Deferasirox tablets for oral suspension What is this medicine? DEFERASIROX (de FER a sir ox) binds to iron in the blood. It helps to treat too much iron in the blood. This medicine may be used for other purposes; ask your health care provider or pharmacist if you have questions. COMMON BRAND NAME(S): Exjade What should I tell my health care provider before I take this medicine? They need to know if you have any of these conditions:  cancer  eye disease, vision problems  hearing problems  history of blood diseases  kidney disease  liver disease  low blood counts, like low white cell, platelet, or red cell counts  an unusual or allergic reaction to deferasirox, other medicines, foods, dyes, or preservatives  pregnant or trying to get pregnant  breast-feeding How should I use this medicine? Take this medicine by mouth. Follow the directions on the prescription label. Take this medicine on an empty stomach, at least 30 minutes before or 2 hours after food. Do not take with food. Take your medicine at regular intervals. Do not take it more often than directed. Do not stop taking except on your doctor's advice. Talk to your pediatrician regarding the use of this medicine in children. While this medicine may be prescribed for children as young as 35 years of age for selected conditions, precautions do apply. Overdosage: If you think you have taken too much of this medicine contact a poison control center or emergency room at once. NOTE: This medicine is only for you. Do not share this medicine with others. What if I miss a dose? If you miss a dose, take it as soon as you can. If it is almost time for your next dose, take only that dose. Do not take double or extra doses. What may interact with this medicine? Do not take this medicine with any of the following medications:  dasabuvir; ombitasvir, paritaprevir; ritonavir  iron supplements  ombitasvir; paritaprevir; ritonavir This  medicine may also interact with the following medications:  antacids  birth control pills  certain medicines for cholesterol like cholestyramine, colesevelam, colestipol, simvastatin  certain medicines for osteoporosis like alendronate, risedronate  certain medicines for seizures like phenobarbital, phenytoin  certain medicines that treat or prevent blood clots like warfarin  conivaptan  cyclosporine  NSAIDs, medicines for pain and inflammation, like ibuprofen or naproxen  paclitaxel  repaglinide  rifampin  ritonavir  steroid medicines like prednisone or cortisone  theophylline This list may not describe all possible interactions. Give your health care provider a list of all the medicines, herbs, non-prescription drugs, or dietary supplements you use. Also tell them if you smoke, drink alcohol, or use illegal drugs. Some items may interact with your medicine. What should I watch for while using this medicine? Tell your doctor or health care provider if your symptoms do not start to get better or if they get worse. This medicine may cause serious skin reactions. They can happen weeks to months after starting the medicine. Contact your health care provider right away if you notice fevers or flu-like symptoms with a rash. The rash may be red or purple and then turn into blisters or peeling of the skin. Or, you might notice a red rash with swelling of the face, lips or lymph nodes in your neck or under your arms. Your vision, hearing, and blood may be tested before and during use of this medicine. If you get black, tarry stools or vomit up what looks like coffee grounds, call  your doctor or health care provider right away. You may have a bleeding ulcer. You may get dizzy. Do not drive, use machinery, or do anything that needs mental alertness until you know how this medicine affects you. Do not stand or sit up quickly, especially if you are an older patient. This reduces the risk of  dizzy or fainting spells. Check with your doctor or health care provider if your child gets an attack of severe diarrhea, nausea and vomiting, or if they sweat a lot. The loss of too much body fluid can make it dangerous for them to take this medicine. What side effects may I notice from receiving this medicine? Side effects that you should report to your doctor or health care professional as soon as possible:  allergic reactions like skin rash, itching or hives, swelling of the face, lips, or tongue  changes in vision  decreased hearing  fever or chills, sore throat  rash, fever, and swollen lymph nodes  redness, blistering, peeling or loosening of skin, including inside the mouth  signs and symptoms of bleeding such as bloody or black, tarry stools; spitting up blood or brown material that looks like coffee grounds  signs and symptoms of liver injury like dark yellow or brown urine; general ill feeling or flu-like symptoms; light-colored stools; loss of appetite; nausea; right upper belly pain; unusually weak or tired; yellowing of the eyes or skin  trouble passing urine or change in the amount of urine  unusual bleeding or bruising Side effects that usually do not require medical attention (report these to your doctor or health care professional if they continue or are bothersome):  diarrhea  nausea, vomiting  stomach pain  trouble sleeping This list may not describe all possible side effects. Call your doctor for medical advice about side effects. You may report side effects to FDA at 1-800-FDA-1088. Where should I keep my medicine? Keep out of the reach of children. Store at room temperature between 15 and 30 degrees C (59 and 86 degrees F). Throw away any unused medicine after the expiration date. NOTE: This sheet is a summary. It may not cover all possible information. If you have questions about this medicine, talk to your doctor, pharmacist, or health care provider.   2020 [employer] Standard (2018-11-17 13:08:34)   Deferasirox granules What is this medicine? DEFERASIROX (de FER a sir ox) binds to iron in the blood. It helps to treat too much iron in the blood. This medicine may be used for other purposes; ask your health care provider or pharmacist if you have questions. COMMON BRAND NAME(S): Jadenu What should I tell my health care provider before I take this medicine? They need to know if you have any of these conditions:  cancer  eye disease, vision problems  hearing problems  history of blood diseases  kidney disease  liver disease  low blood counts, like low white cell, platelet, or red cell counts  an unusual or allergic reaction to deferasirox, other medicines, foods, dyes, or preservatives  pregnant or trying to get pregnant  breast-feeding How should I use this medicine? Take this medicine by mouth. Follow the directions on the prescription label. Take this medicine on an empty stomach, at least 30 minutes before or 2 hours after food. Take your medicine at regular intervals. Do not take it more often than directed. Do not stop taking except on your doctor's advice. Talk to your pediatrician regarding the use of this medicine in children. While this  medicine may be prescribed for children as young as 64 years of age for selected conditions, precautions do apply. Overdosage: If you think you have taken too much of this medicine contact a poison control center or emergency room at once. NOTE: This medicine is only for you. Do not share this medicine with others. What if I miss a dose? If you miss a dose, take it as soon as you can. If it is almost time for your next dose, take only that dose. Do not take double or extra doses. What may interact with this medicine? Do not take this medicine with any of the following medications:  dasabuvir; ombitasvir, paritaprevir; ritonavir  iron supplements  ombitasvir; paritaprevir;  ritonavir This medicine may also interact with the following medications:  antacids  birth control pills  certain medicines for cholesterol like cholestyramine, colesevelam, colestipol, simvastatin  certain medicines for osteoporosis like alendronate, risedronate  certain medicines for seizures like phenobarbital, phenytoin  certain medicines that treat or prevent blood clots like warfarin  conivaptan  cyclosporine  NSAIDs, medicines for pain and inflammation, like ibuprofen or naproxen  paclitaxel  repaglinide  rifampin  ritonavir  steroid medicines like prednisone or cortisone  theophylline This list may not describe all possible interactions. Give your health care provider a list of all the medicines, herbs, non-prescription drugs, or dietary supplements you use. Also tell them if you smoke, drink alcohol, or use illegal drugs. Some items may interact with your medicine. What should I watch for while using this medicine? Tell your doctor or health care provider if your symptoms do not start to get better or if they get worse. This medicine may cause serious skin reactions. They can happen weeks to months after starting the medicine. Contact your health care provider right away if you notice fevers or flu-like symptoms with a rash. The rash may be red or purple and then turn into blisters or peeling of the skin. Or, you might notice a red rash with swelling of the face, lips or lymph nodes in your neck or under your arms. Your vision, hearing, and blood may be tested before and during use of this medicine. If you get black, tarry stools or vomit up what looks like coffee grounds, call your doctor or health care provider right away. You may have a bleeding ulcer. You may get dizzy. Do not drive, use machinery, or do anything that needs mental alertness until you know how this medicine affects you. Do not stand or sit up quickly, especially if you are an older patient. This  reduces the risk of dizzy or fainting spells. Check with your doctor or health care provider if your child gets an attack of severe diarrhea, nausea and vomiting, or if they sweat a lot. The loss of too much body fluid can make it dangerous for them to take this medicine. What side effects may I notice from receiving this medicine? Side effects that you should report to your doctor or health care professional as soon as possible:  allergic reactions like skin rash, itching or hives, swelling of the face, lips, or tongue  changes in vision  decreased hearing  fever or chills, sore throat  rash, fever, and swollen lymph nodes  redness, blistering, peeling or loosening of skin, including inside the mouth  signs and symptoms of bleeding such as bloody or black, tarry stools; spitting up blood or brown material that looks like coffee grounds  signs and symptoms of liver injury like dark  yellow or brown urine; general ill feeling or flu-like symptoms; light-colored stools; loss of appetite; nausea; right upper belly pain; unusually weak or tired; yellowing of the eyes or skin  trouble passing urine or change in the amount of urine  unusual bleeding or bruising Side effects that usually do not require medical attention (report these to your doctor or health care professional if they continue or are bothersome):  diarrhea  nausea, vomiting  stomach pain  trouble sleeping This list may not describe all possible side effects. Call your doctor for medical advice about side effects. You may report side effects to FDA at 1-800-FDA-1088. Where should I keep my medicine? Keep out of the reach of children. Store at room temperature between 15 and 30 degrees C (59 and 86 degrees F). Throw away any unused medicine after the expiration date. NOTE: This sheet is a summary. It may not cover all possible information. If you have questions about this medicine, talk to your doctor, pharmacist, or health  care provider.  2020 [employer] Standard (2018-11-17 13:04:43)

## 2020-05-23 ENCOUNTER — Inpatient Hospital Stay: Payer: Medicare Other

## 2020-05-23 DIAGNOSIS — D469 Myelodysplastic syndrome, unspecified: Secondary | ICD-10-CM

## 2020-05-23 DIAGNOSIS — D4621 Refractory anemia with excess of blasts 1: Secondary | ICD-10-CM | POA: Diagnosis not present

## 2020-05-23 DIAGNOSIS — D649 Anemia, unspecified: Secondary | ICD-10-CM

## 2020-05-23 MED ORDER — ACETAMINOPHEN 325 MG PO TABS: 650.0000 mg | ORAL_TABLET | Freq: Once | ORAL | Status: DC

## 2020-05-23 MED ORDER — SODIUM CHLORIDE 0.9% IV SOLUTION
250.0000 mL | Freq: Once | INTRAVENOUS | Status: AC
  Administered 2020-05-23: 250 mL via INTRAVENOUS
  Filled 2020-05-23: qty 250

## 2020-05-23 MED ORDER — DIPHENHYDRAMINE HCL 25 MG PO CAPS: 25.0000 mg | ORAL_CAPSULE | Freq: Once | ORAL | Status: DC

## 2020-05-24 LAB — TYPE AND SCREEN
ABO/RH(D): B POS
Antibody Screen: NEGATIVE
Unit division: 0

## 2020-05-24 LAB — BPAM RBC
Blood Product Expiration Date: 202110192359
ISSUE DATE / TIME: 202109231335
Unit Type and Rh: 7300

## 2020-06-05 ENCOUNTER — Telehealth: Payer: Self-pay

## 2020-06-05 ENCOUNTER — Other Ambulatory Visit: Payer: Self-pay

## 2020-06-05 ENCOUNTER — Inpatient Hospital Stay: Payer: Medicare Other | Attending: Hematology and Oncology

## 2020-06-05 ENCOUNTER — Other Ambulatory Visit: Payer: Self-pay | Admitting: Hematology and Oncology

## 2020-06-05 DIAGNOSIS — D4621 Refractory anemia with excess of blasts 1: Secondary | ICD-10-CM | POA: Diagnosis present

## 2020-06-05 DIAGNOSIS — D649 Anemia, unspecified: Secondary | ICD-10-CM

## 2020-06-05 DIAGNOSIS — D469 Myelodysplastic syndrome, unspecified: Secondary | ICD-10-CM

## 2020-06-05 LAB — CBC
HCT: 22 % — ABNORMAL LOW (ref 39.0–52.0)
Hemoglobin: 7.1 g/dL — ABNORMAL LOW (ref 13.0–17.0)
MCH: 28.9 pg (ref 26.0–34.0)
MCHC: 32.3 g/dL (ref 30.0–36.0)
MCV: 89.4 fL (ref 80.0–100.0)
Platelets: 145 10*3/uL — ABNORMAL LOW (ref 150–400)
RBC: 2.46 MIL/uL — ABNORMAL LOW (ref 4.22–5.81)
RDW: 14.3 % (ref 11.5–15.5)
WBC: 2.4 10*3/uL — ABNORMAL LOW (ref 4.0–10.5)
nRBC: 0.8 % — ABNORMAL HIGH (ref 0.0–0.2)

## 2020-06-05 LAB — PREPARE RBC (CROSSMATCH)

## 2020-06-05 MED ORDER — DIPHENHYDRAMINE HCL 25 MG PO CAPS: 25.0000 mg | ORAL_CAPSULE | Freq: Once | ORAL | Status: DC

## 2020-06-05 NOTE — Telephone Encounter (Signed)
Blood transfusion

## 2020-06-06 ENCOUNTER — Other Ambulatory Visit: Payer: Self-pay | Admitting: *Deleted

## 2020-06-06 ENCOUNTER — Inpatient Hospital Stay: Payer: Medicare Other

## 2020-06-06 DIAGNOSIS — D649 Anemia, unspecified: Secondary | ICD-10-CM

## 2020-06-06 DIAGNOSIS — D4621 Refractory anemia with excess of blasts 1: Secondary | ICD-10-CM | POA: Diagnosis not present

## 2020-06-06 DIAGNOSIS — D469 Myelodysplastic syndrome, unspecified: Secondary | ICD-10-CM

## 2020-06-06 MED ORDER — SODIUM CHLORIDE 0.9% IV SOLUTION
250.0000 mL | Freq: Once | INTRAVENOUS | Status: AC
  Administered 2020-06-06: 250 mL via INTRAVENOUS
  Filled 2020-06-06: qty 250

## 2020-06-06 MED ORDER — ACETAMINOPHEN 325 MG PO TABS: 650.0000 mg | ORAL_TABLET | Freq: Once | ORAL | Status: DC

## 2020-06-07 LAB — TYPE AND SCREEN
ABO/RH(D): B POS
Antibody Screen: NEGATIVE
Unit division: 0

## 2020-06-07 LAB — BPAM RBC
Blood Product Expiration Date: 202110252359
ISSUE DATE / TIME: 202110070930
Unit Type and Rh: 7300

## 2020-06-19 ENCOUNTER — Other Ambulatory Visit: Payer: Self-pay

## 2020-06-19 ENCOUNTER — Inpatient Hospital Stay: Payer: Medicare Other

## 2020-06-19 ENCOUNTER — Other Ambulatory Visit: Payer: Self-pay | Admitting: Hematology and Oncology

## 2020-06-19 DIAGNOSIS — D469 Myelodysplastic syndrome, unspecified: Secondary | ICD-10-CM

## 2020-06-19 DIAGNOSIS — D649 Anemia, unspecified: Secondary | ICD-10-CM

## 2020-06-19 DIAGNOSIS — D4621 Refractory anemia with excess of blasts 1: Secondary | ICD-10-CM | POA: Diagnosis not present

## 2020-06-19 LAB — CBC WITH DIFFERENTIAL/PLATELET
Abs Immature Granulocytes: 0.3 10*3/uL — ABNORMAL HIGH (ref 0.00–0.07)
Basophils Absolute: 0 10*3/uL (ref 0.0–0.1)
Basophils Relative: 0 %
Eosinophils Absolute: 0 10*3/uL (ref 0.0–0.5)
Eosinophils Relative: 0 %
HCT: 23.5 % — ABNORMAL LOW (ref 39.0–52.0)
Hemoglobin: 7.5 g/dL — ABNORMAL LOW (ref 13.0–17.0)
Immature Granulocytes: 11 %
Lymphocytes Relative: 36 %
Lymphs Abs: 1 10*3/uL (ref 0.7–4.0)
MCH: 28.4 pg (ref 26.0–34.0)
MCHC: 31.9 g/dL (ref 30.0–36.0)
MCV: 89 fL (ref 80.0–100.0)
Monocytes Absolute: 0.4 10*3/uL (ref 0.1–1.0)
Monocytes Relative: 13 %
Neutro Abs: 1.1 10*3/uL — ABNORMAL LOW (ref 1.7–7.7)
Neutrophils Relative %: 40 %
Platelets: 152 10*3/uL (ref 150–400)
RBC: 2.64 MIL/uL — ABNORMAL LOW (ref 4.22–5.81)
RDW: 14.4 % (ref 11.5–15.5)
WBC: 2.8 10*3/uL — ABNORMAL LOW (ref 4.0–10.5)
nRBC: 1.1 % — ABNORMAL HIGH (ref 0.0–0.2)

## 2020-06-19 LAB — COMPREHENSIVE METABOLIC PANEL
ALT: 25 U/L (ref 0–44)
AST: 27 U/L (ref 15–41)
Albumin: 4.1 g/dL (ref 3.5–5.0)
Alkaline Phosphatase: 30 U/L — ABNORMAL LOW (ref 38–126)
Anion gap: 8 (ref 5–15)
BUN: 29 mg/dL — ABNORMAL HIGH (ref 8–23)
CO2: 25 mmol/L (ref 22–32)
Calcium: 8.6 mg/dL — ABNORMAL LOW (ref 8.9–10.3)
Chloride: 99 mmol/L (ref 98–111)
Creatinine, Ser: 1.08 mg/dL (ref 0.61–1.24)
GFR, Estimated: 59 mL/min — ABNORMAL LOW (ref >60)
Glucose, Bld: 238 mg/dL — ABNORMAL HIGH (ref 70–99)
Potassium: 4.1 mmol/L (ref 3.5–5.1)
Sodium: 132 mmol/L — ABNORMAL LOW (ref 135–145)
Total Bilirubin: 0.7 mg/dL (ref 0.3–1.2)
Total Protein: 7.4 g/dL (ref 6.5–8.1)

## 2020-06-19 LAB — FERRITIN: Ferritin: 1483 ng/mL — ABNORMAL HIGH (ref 24–336)

## 2020-06-19 LAB — PREPARE RBC (CROSSMATCH)

## 2020-06-20 ENCOUNTER — Inpatient Hospital Stay: Payer: Medicare Other

## 2020-06-20 DIAGNOSIS — D4621 Refractory anemia with excess of blasts 1: Secondary | ICD-10-CM | POA: Diagnosis not present

## 2020-06-20 DIAGNOSIS — D649 Anemia, unspecified: Secondary | ICD-10-CM

## 2020-06-20 DIAGNOSIS — D469 Myelodysplastic syndrome, unspecified: Secondary | ICD-10-CM

## 2020-06-20 MED ORDER — SODIUM CHLORIDE 0.9% IV SOLUTION
250.0000 mL | Freq: Once | INTRAVENOUS | Status: AC
  Administered 2020-06-20: 250 mL via INTRAVENOUS
  Filled 2020-06-20: qty 250

## 2020-06-20 MED ORDER — ACETAMINOPHEN 325 MG PO TABS: 650.0000 mg | ORAL_TABLET | Freq: Once | ORAL | Status: DC

## 2020-06-20 MED ORDER — DIPHENHYDRAMINE HCL 25 MG PO CAPS: 25.0000 mg | ORAL_CAPSULE | Freq: Once | ORAL | Status: DC

## 2020-06-21 LAB — TYPE AND SCREEN
ABO/RH(D): B POS
Antibody Screen: NEGATIVE
Unit division: 0

## 2020-06-21 LAB — BPAM RBC
Blood Product Expiration Date: 202110282359
ISSUE DATE / TIME: 202110210931
Unit Type and Rh: 9500

## 2020-06-28 ENCOUNTER — Other Ambulatory Visit: Payer: Self-pay

## 2020-06-28 DIAGNOSIS — D469 Myelodysplastic syndrome, unspecified: Secondary | ICD-10-CM

## 2020-07-02 NOTE — Progress Notes (Signed)
[facility]  [address], [address] [place], [place] [address] Phone: [phone]  Fax: [phone]   Clinic Day:  07/03/20   Referring physician: No ref. provider found  Chief Complaint: Paul Pham. is a 86 y.o. 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  HPI: The patient was last seen in the hematology clinic on 05/22/2020. At that time, he felt fatigued.  He had no interval infections.  He required transfusion every 1-2 weeks.  Exam was stable. Hematocrit was 23.5, hemoglobin 7.6, MCV 90.7, platelets 151,000, WBC 2,300 (ANC 1,000). Sodium was 133. Glucose 199. Ferritin was 1,203.  Labs followed: 06/05/2020: Hematocrit 22.0, hemoglobin 7.1, MCV 89.4, platelets 145,000, WBC 2,400. 06/19/2020: Hematocrit 23.5, hemoglobin 7.5, MCV 89.0, platelets 152,000, WBC 2,800 (ANC 1,100).   He received transfusions on 05/23/2020, 06/06/2020, 06/20/2020.  During the interim, he has been "a little slower than normal".  He has felt tired for the past few days and is not sure why. He has not been able to go on walks over the past few days, which is something that he does regularly. He notes that he scratched his left ear in his sleep and there was some blood on his pillow when he woke up. He denies chest pain and shortness of breath. His appetite is good. He sleeps well. His blood sugars are a little bit high.   Past Medical History:  Diagnosis Date  . Anemia   . Arthritis   . BPH (benign prostatic hypertrophy)   . Cancer ([place])   . Complication of anesthesia    nausea  . Diabetes type 2, controlled ([place])   . HOH (hard of hearing)    r and L ears-70% loss per pt  . Hyperlipidemia   . Hypertension   . Myelodysplasia (myelodysplastic syndrome) ([place]) 2019    Past Surgical History:  Procedure Laterality Date  . APPENDECTOMY    . COLONOSCOPY  09/21/08   1 polyp found, tubular adenoma  . HAND SURGERY    . KNEE SURGERY Left     Family History   Problem Relation Age of Onset  . Aneurysm Mother   . Heart disease Father   . Cancer Brother        skin  . Heart disease Brother   . Heart disease Brother   . Cancer Sister        skin  . Aneurysm Brother   . Heart disease Brother   . Heart disease Sister   . Heart disease Sister   . COPD Sister   . Diabetes Sister     Social History:  reports that he has never smoked. He has never used smokeless tobacco. He reports that he does not drink alcohol and does not use drugs. 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 Patient denies known exposures to radiation on toxins. 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 He had his 31-year marriage anniversary on 08/23/2018.He is walking 1-94mles per day.[place]. The patient is accompanied by his wife today.  Allergies: No Known Allergies  Current Medications: Current Outpatient Medications  Medication Sig Dispense Refill  . aspirin 81 MG chewable tablet Chew 81 mg by mouth daily.    . Calcium Carbonate-Vit D-Min (CALCIUM 1200 PO) Take 1,200 mg by mouth in the morning and at bedtime.    . Cranberry (THERACRAN PO) Take 1 tablet by mouth daily.     .Marland Kitchen  cyanocobalamin 1000 MCG tablet Take 1,000 mcg by mouth daily.    . finasteride (PROSCAR) 5 MG tablet Take 5 mg by mouth daily.    Marland Kitchen glimepiride (AMARYL) 1 MG tablet Take 1 tablet by mouth daily after breakfast.    . glucose blood test strip FreeStyle Lite Strips    . LANCETS ULTRA FINE MISC Lancets,Ultra Thin 26 gauge    . lisinopril (PRINIVIL,ZESTRIL) 10 MG tablet Take 10 mg by mouth daily.    . meclizine (ANTIVERT) 25 MG tablet Take 25 mg by mouth 2 (two) times daily as needed.     . metformin (FORTAMET) 1000 MG (OSM) 24 hr tablet Take 1,000 mg by mouth 2 (two) times daily with a meal.    . Multiple Vitamins-Minerals (CENTRUM SILVER PO) Take by mouth.    . mupirocin ointment (BACTROBAN) 2 % Apply 1 application topically daily.  With dressing changes 22 g 0  . Omega-3 Fatty Acids (FISH OIL PO) Take 1 tablet by mouth daily.     . Polyethylene Glycol 3350 (MIRALAX PO) Take by mouth as needed.     [name] (PROSTATE SR PO) Take 1 tablet by mouth daily.     . simvastatin (ZOCOR) 40 MG tablet Take 40 mg by mouth daily.    Marland Kitchen loperamide (IMODIUM) 2 MG capsule Take 1 capsule (2 mg total) by mouth See admin instructions. With onset of loose stool, take 67m followed by 236mevery 2 hours until loose bowel movement stopped. Maximum: 16 mg/day (Patient not taking: Reported on 04/17/2020) 30 capsule 0  . ondansetron (ZOFRAN) 4 MG tablet Take 1 tablet (4 mg total) by mouth every 6 (six) hours as needed for nausea or vomiting. (Patient not taking: Reported on 04/17/2020) 30 tablet 2   No current facility-administered medications for this visit.   Facility-Administered Medications Ordered in Other Visits  Medication Dose Route Frequency Provider Last Rate Last Admin  . acetaminophen (TYLENOL) tablet 650 mg  650 mg Oral Once CoLequita AsalMD      . acetaminophen (TYLENOL) tablet 650 mg  650 mg Oral Once CoLequita AsalMD      . acetaminophen (TYLENOL) tablet 650 mg  650 mg Oral Once CoLequita AsalMD      . acetaminophen (TYLENOL) tablet 650 mg  650 mg Oral Once CoLequita AsalMD      . diphenhydrAMINE (BENADRYL) capsule 25 mg  25 mg Oral Once CoNolon Stalls, MD      . diphenhydrAMINE (BENADRYL) capsule 25 mg  25 mg Oral Once CoNolon Stalls, MD      . diphenhydrAMINE (BENADRYL) capsule 25 mg  25 mg Oral Once CoNolon Stalls, MD      . diphenhydrAMINE (BENADRYL) capsule 25 mg  25 mg Oral Once CoLequita AsalMD        Review of Systems  Constitutional: Positive for malaise/fatigue. Negative for chills, diaphoresis, fever and weight loss (stable).       Feels "slower than usual."  HENT: Positive for hearing loss. Negative for congestion, ear discharge, ear pain, nosebleeds,  sinus pain, sore throat and tinnitus.   Eyes: Negative.  Negative for blurred vision.  Respiratory: Negative.  Negative for cough, hemoptysis, sputum production and shortness of breath.   Cardiovascular: Negative.  Negative for chest pain, palpitations and leg swelling.  Gastrointestinal: Negative.  Negative for abdominal pain, blood in stool, constipation, diarrhea, heartburn, melena, nausea and vomiting.  Eating well.  Genitourinary: Negative.  Negative for dysuria, frequency, hematuria and urgency.  Musculoskeletal: Negative.  Negative for back pain, joint pain, myalgias and neck pain.  Skin: Negative for itching and rash.       Scratch on left ear.  Neurological: Negative.  Negative for dizziness, tingling, sensory change, weakness and headaches.  Endo/Heme/Allergies: Does not bruise/bleed easily.       Diabetes.  Psychiatric/Behavioral: Negative.  Negative for depression and memory loss. The patient is not nervous/anxious and does not have insomnia.   All other systems reviewed and are negative.  Performance status (ECOG): 2  Vitals Blood pressure 122/66, pulse 81, temperature (!) 97.4 F (36.3 C), temperature source Tympanic, resp. rate 18, height '6\' 1"'  (1.854 m), weight 178 lb 9.2 oz (81 kg), SpO2 100 %.   Physical Exam Vitals and nursing note reviewed.  Constitutional:      General: He is not in acute distress.    Appearance: He is well-developed. He is not diaphoretic.  HENT:     Head: Normocephalic and atraumatic.     Comments: Male pattern baldness.    Mouth/Throat:     Mouth: Mucous membranes are moist.     Pharynx: Oropharynx is clear. No oropharyngeal exudate.  Eyes:     General: No scleral icterus.    Conjunctiva/sclera: Conjunctivae normal.     Pupils: Pupils are equal, round, and reactive to light.     Comments: Gold rimmed glasses.  Blue eyes.   Cardiovascular:     Rate and Rhythm: Normal rate and regular rhythm.     Heart sounds: Normal heart sounds. No  murmur heard.   Pulmonary:     Effort: Pulmonary effort is normal. No respiratory distress.     Breath sounds: Normal breath sounds. No wheezing or rales.     Comments: Atelectasis resolves with deep breathing. Chest:     Chest wall: No tenderness.  Abdominal:     General: Bowel sounds are normal. There is no distension.     Palpations: Abdomen is soft. There is no hepatomegaly, splenomegaly or mass.     Tenderness: There is no abdominal tenderness. There is no guarding or rebound.  Musculoskeletal:        General: No swelling or tenderness. Normal range of motion.     Cervical back: Normal range of motion and neck supple.  Lymphadenopathy:     Head:     Right side of head: No preauricular, posterior auricular or occipital adenopathy.     Left side of head: No preauricular, posterior auricular or occipital adenopathy.     Cervical: No cervical adenopathy.     Upper Body:     Right upper body: No supraclavicular or axillary adenopathy.     Left upper body: No supraclavicular or axillary adenopathy.     Lower Body: No right inguinal adenopathy. No left inguinal adenopathy.  Skin:    General: Skin is warm and dry.  Neurological:     Mental Status: He is alert and oriented to person, place, and time.  Psychiatric:        Behavior: Behavior normal.        Thought Content: Thought content normal.        Judgment: Judgment normal.    Appointment on 07/03/2020  Component Date Value Ref Range Status  . WBC 07/03/2020 3.3* 4.0 - 10.5 K/uL Final  . RBC 07/03/2020 2.39* 4.22 - 5.81 MIL/uL Final  . Hemoglobin 07/03/2020 6.8* 13.0 - 17.0 g/dL  Final  . HCT 07/03/2020 20.9* 39 - 52 % Final  . MCV 07/03/2020 87.4  80.0 - 100.0 fL Final  . MCH 07/03/2020 28.5  26.0 - 34.0 pg Final  . MCHC 07/03/2020 32.5  30.0 - 36.0 g/dL Final  . RDW 07/03/2020 14.3  11.5 - 15.5 % Final  . Platelets 07/03/2020 136* 150 - 400 K/uL Final  . nRBC 07/03/2020 0.9* 0.0 - 0.2 % Final   Performed at [facility], [address], [place], [place] [address]  . Neutrophils Relative % 07/03/2020 PENDING  % Incomplete  . Neutro Abs 07/03/2020 PENDING  1.7 - 7.7 K/uL Incomplete  . Band Neutrophils 07/03/2020 PENDING  % Incomplete  . Lymphocytes Relative 07/03/2020 PENDING  % Incomplete  . Lymphs Abs 07/03/2020 PENDING  0.7 - 4.0 K/uL Incomplete  . Monocytes Relative 07/03/2020 PENDING  % Incomplete  . Monocytes Absolute 07/03/2020 PENDING  0.1 - 1.0 K/uL Incomplete  . Eosinophils Relative 07/03/2020 PENDING  % Incomplete  . Eosinophils Absolute 07/03/2020 PENDING  0.0 - 0.5 K/uL Incomplete  . Basophils Relative 07/03/2020 PENDING  % Incomplete  . Basophils Absolute 07/03/2020 PENDING  0.0 - 0.1 K/uL Incomplete  . WBC Morphology 07/03/2020 PENDING   Incomplete  . RBC Morphology 07/03/2020 PENDING   Incomplete  . Smear Review 07/03/2020 PENDING   Incomplete  . Other 07/03/2020 PENDING  % Incomplete  . nRBC 07/03/2020 PENDING  0 /100 WBC Incomplete  . Metamyelocytes Relative 07/03/2020 PENDING  % Incomplete  . Myelocytes 07/03/2020 PENDING  % Incomplete  . Promyelocytes Relative 07/03/2020 PENDING  % Incomplete  . Blasts 07/03/2020 PENDING  % Incomplete  . Immature Granulocytes 07/03/2020 PENDING  % Incomplete  . Abs Immature Granulocytes 07/03/2020 PENDING  0.00 - 0.07 K/uL Incomplete    Assessment:  Paul Huynh. is a 86 y.o. 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 Bone marrowon 09/22/2017 revealed a hypercellular for age with dyspoietic changes variably involving myeloid cell lines, but with main involvement of the granulocytic/monocytic cell line. This was associated with bone marrow monocytosis and borderline number to slight increase in blastic cells. The overall changes favor a primary myeloid neoplasm, particularly a myelodysplastic syndrome especially refractory anemia with excess blasts (RAEB-1) or possibly refractory cytopenia with multilineage dysplasia. Consideration was also given to an  evolving myelodysplastic/myeloproliferative neoplasm such as chronic myelomonocytic leukemia but the lack of absolute peripheral monocytosis precluded such a diagnosis at this time. Flow cytometrywas negative. Cytogeneticswere normal (46, XY). Foundation one was positive for ASXL1 V400QQ*76, EZH2 Splice site 195-0D>T, RUNX1 [id number]. IPSS-R4.5, intermediate group.  Anemia work-up on 08/17/2017: Vitamin B12 (658), folate (22.0), and TSH (2.688). Retic was 2.6%. Haptoglobin was 116 on 08/19/2017. Epo levelwas 104.8 on 10/05/2017.  Ferritin has been followed: 106 on 08/17/2017, 131 on 11/04/2017, 144 on 05/30/2018, 394 on 09/01/2018, 441 on 11/28/2018, 394 on 01/02/2019, 480 on 02/06/2019, 392 on 03/13/2019, 527 on 04/17/2019, 533 on 05/29/2019, 476 on 06/05/2019, 517 on 07/17/2019, 685 on 08/28/2019, 724 on 10/16/2019, 749 on 01/08/2020, 95 on 02/08/2020, 811 on 03/07/2020, 1189 on 04/17/2020, 1203 on 05/22/2020, 1483 on 06/19/2020, and 1288 on 07/03/2020. Iron saturation was 34% on 08/17/2017 and 89% on 09/01/2018.  Bone marrowon 09/22/2017 revealed a hypercellular for age with dyspoietic changes variably involving myeloid cell lines, but with main involvement of the granulocytic/monocytic cell line. This was associated with bone marrow monocytosis and borderline number to slight increase in blasts (5%). The overall changes favor a primary myeloid neoplasm, particularly a myelodysplastic syndrome  especially refractory anemia with excess blasts (RAEB-1) or possibly refractory cytopenia with multilineage dysplasia. Consideration was also given to an evolving myelodysplastic/myeloproliferative neoplasm such as chronic myelomonocytic leukemia but the lack of absolute peripheral monocytosis precluded such a diagnosis at this time. Flow cytometrywas negative. Cytogeneticswere normal (46, XY). Foundation One was positive for ASXL1 E831DV*76, EZH2 Splice site 160-7P>X, RUNX1 [id number].  He has  received20cycles ofVidaza(11/04/2017 - 08/22/2018; 10/24/2018-10/23/2019). He has received Aranesp(initially 150 mcg every 2 weeks post chemotherapy then 300 mcg every 1-2 weeks after cycle #3; last 09/19/2018). He last received Aranesp on02/15/2021. He has received 1-2units of PRBCswith each cycle.He has received45 units of PRBCs since 08/17/2017 (last 06/20/2020).  Bone marrowon 08/12/2018 revealed a hypercellular marrow with dyspoietic changes. There was a borderline number of blasts (5%) seen by morphology. The findings were similar to previous biopsy although the cellularity is slightly higher in the current material.consistent with previously known myelodysplastic syndrome. Flow cytometry was negative. Cytogenetics were normal (46, XY).  Bone marrowon 11/27/2019 revealed a hypercellular bone marrow (60-80%) with myelodysplastic syndrome. The morphologic features were similar to the previous biopsy. There was no definite increase in blasts. Flow cytometry revealed no significant CD34-positive blastic population, increase in monocytic cells (up to 18%), Tcells with nonspecific phenotypic changes, and no monoclonal B-cell population identified.Cytogenetics were normal (46, XY). FISH was negative. Next-Gen myeloid disorder profilerevealsASXL1G632f*15,EZH2splice site 1106-2I>R,SWNIOEsite cV.035-0KXF splice site cG.[id number],OFBP1W258N*27 There were no abnormalities in FLT3, IDH1, IDH2, NPM1.  [facility] revealed a hypercellular bone marrow (80%) with persistent morphologic dyspoiesis and 9% blasts/promonocytes by manual differential.  [place] and 09/27/2019.  Symptomatically, he has been "a little slower than normal".  He has felt tired for the past few days. He has not been able to go on walks over the past few days.  He denies chest pain and shortness of breath. His appetite is  good. He sleeps well.  Exam is stable.  Plan: 1.   Labs today: CBC with diff, CMP, ferritin, type and screen. 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  Helast received Vidazaon 10/23/2019. Treatmentgoalwas todecrease transfusion dependence and prevent acceleration of disease (blasts). [facility] was hypercellular (80%) with persistent morphologic dyspoiesis and 9% blasts/promonocytes by manual differential.  He remains on supportive care (transfusion support alone).  Hematocrit 20.9.  Hemoglobin 6.8.  MCV 87.4 today.  Patient denies any bleeding  Discuss plan for weekly CBCs secondary to drop in hemoglobin. 3. Iron overload Ferritin1288 today. He has received 45units of PRBCs to date (08/17/2017 -06/20/2020). Previously we discussed Jadenu potential side effects of myelosuppression renal insufficiency.  Patient not interested in Jadenu at this time. 4.   Transfuse 1 unit PRBCs tomorrow. 5.   RTC on 11/08 for labs (CBC, hold tube) and +/- transfusion on 07/09/2020. 6.   RTC weekly (beginning 11/15) x 3 for labs (CBC with diff, hold tube). 7.   RTC in 5 weeks for MD assessment, labs (CBC with diff, CMP, ferritin, hold tube), and +/- PRBC transfusion the next day.  I discussed the assessment and treatment plan with the patient.  The patient was provided an opportunity to ask questions and all were answered.  The patient agreed with the plan and demonstrated an understanding of the instructions.  The patient was advised to call back if the symptoms worsen or if the condition fails to improve as anticipated.   MLequita Asal  MD, PhD    07/03/2020, 4:12 PM  I, Mirian Mo Tufford, am acting as a Education administrator for [employer]. Mike Gip, MD.   I,  C. Mike Gip, MD, have reviewed the above documentation for accuracy and  completeness, and I agree with the above.

## 2020-07-03 ENCOUNTER — Encounter: Payer: Self-pay | Admitting: Hematology and Oncology

## 2020-07-03 ENCOUNTER — Inpatient Hospital Stay (HOSPITAL_BASED_OUTPATIENT_CLINIC_OR_DEPARTMENT_OTHER): Payer: Medicare Other | Admitting: Hematology and Oncology

## 2020-07-03 ENCOUNTER — Other Ambulatory Visit: Payer: Self-pay | Admitting: Hematology and Oncology

## 2020-07-03 ENCOUNTER — Other Ambulatory Visit: Payer: Self-pay

## 2020-07-03 ENCOUNTER — Inpatient Hospital Stay: Payer: Medicare Other | Attending: Hematology and Oncology

## 2020-07-03 VITALS — BP 122/66 | HR 81 | Temp 97.4°F | Resp 18 | Ht 73.0 in | Wt 178.6 lb

## 2020-07-03 DIAGNOSIS — D469 Myelodysplastic syndrome, unspecified: Secondary | ICD-10-CM

## 2020-07-03 DIAGNOSIS — D4621 Refractory anemia with excess of blasts 1: Secondary | ICD-10-CM | POA: Diagnosis present

## 2020-07-03 DIAGNOSIS — D649 Anemia, unspecified: Secondary | ICD-10-CM

## 2020-07-03 LAB — CBC WITH DIFFERENTIAL/PLATELET
Abs Immature Granulocytes: 0.38 10*3/uL — ABNORMAL HIGH (ref 0.00–0.07)
Basophils Absolute: 0 10*3/uL (ref 0.0–0.1)
Basophils Relative: 0 %
Eosinophils Absolute: 0 10*3/uL (ref 0.0–0.5)
Eosinophils Relative: 0 %
HCT: 20.9 % — ABNORMAL LOW (ref 39.0–52.0)
Hemoglobin: 6.8 g/dL — ABNORMAL LOW (ref 13.0–17.0)
Immature Granulocytes: 12 %
Lymphocytes Relative: 31 %
Lymphs Abs: 1 10*3/uL (ref 0.7–4.0)
MCH: 28.5 pg (ref 26.0–34.0)
MCHC: 32.5 g/dL (ref 30.0–36.0)
MCV: 87.4 fL (ref 80.0–100.0)
Monocytes Absolute: 0.5 10*3/uL (ref 0.1–1.0)
Monocytes Relative: 14 %
Neutro Abs: 1.4 10*3/uL — ABNORMAL LOW (ref 1.7–7.7)
Neutrophils Relative %: 43 %
Platelets: 136 10*3/uL — ABNORMAL LOW (ref 150–400)
RBC: 2.39 MIL/uL — ABNORMAL LOW (ref 4.22–5.81)
RDW: 14.3 % (ref 11.5–15.5)
WBC: 3.3 10*3/uL — ABNORMAL LOW (ref 4.0–10.5)
nRBC: 0.9 % — ABNORMAL HIGH (ref 0.0–0.2)

## 2020-07-03 LAB — COMPREHENSIVE METABOLIC PANEL
ALT: 30 U/L (ref 0–44)
AST: 26 U/L (ref 15–41)
Albumin: 3.9 g/dL (ref 3.5–5.0)
Alkaline Phosphatase: 39 U/L (ref 38–126)
Anion gap: 11 (ref 5–15)
BUN: 26 mg/dL — ABNORMAL HIGH (ref 8–23)
CO2: 24 mmol/L (ref 22–32)
Calcium: 8.4 mg/dL — ABNORMAL LOW (ref 8.9–10.3)
Chloride: 96 mmol/L — ABNORMAL LOW (ref 98–111)
Creatinine, Ser: 1.01 mg/dL (ref 0.61–1.24)
GFR, Estimated: >60 mL/min (ref >60)
Glucose, Bld: 283 mg/dL — ABNORMAL HIGH (ref 70–99)
Potassium: 4 mmol/L (ref 3.5–5.1)
Sodium: 131 mmol/L — ABNORMAL LOW (ref 135–145)
Total Bilirubin: 0.7 mg/dL (ref 0.3–1.2)
Total Protein: 7.1 g/dL (ref 6.5–8.1)

## 2020-07-03 LAB — SAMPLE TO BLOOD BANK

## 2020-07-03 LAB — FERRITIN: Ferritin: 1288 ng/mL — ABNORMAL HIGH (ref 24–336)

## 2020-07-03 LAB — PREPARE RBC (CROSSMATCH)

## 2020-07-03 NOTE — Progress Notes (Signed)
No new changes noted today 

## 2020-07-04 ENCOUNTER — Inpatient Hospital Stay: Payer: Medicare Other

## 2020-07-04 DIAGNOSIS — D649 Anemia, unspecified: Secondary | ICD-10-CM

## 2020-07-04 DIAGNOSIS — D4621 Refractory anemia with excess of blasts 1: Secondary | ICD-10-CM | POA: Diagnosis not present

## 2020-07-04 DIAGNOSIS — D469 Myelodysplastic syndrome, unspecified: Secondary | ICD-10-CM

## 2020-07-04 MED ORDER — ACETAMINOPHEN 325 MG PO TABS: 650.0000 mg | ORAL_TABLET | Freq: Once | ORAL | Status: DC

## 2020-07-04 MED ORDER — SODIUM CHLORIDE 0.9% IV SOLUTION
250.0000 mL | Freq: Once | INTRAVENOUS | Status: AC
  Administered 2020-07-04: 250 mL via INTRAVENOUS
  Filled 2020-07-04: qty 250

## 2020-07-04 MED ORDER — DIPHENHYDRAMINE HCL 25 MG PO CAPS: 25.0000 mg | ORAL_CAPSULE | Freq: Once | ORAL | Status: DC

## 2020-07-04 NOTE — Progress Notes (Signed)
Patient received 1 unit PRBC in clinic today; tolerated well. Patient discharged in stable condition. 

## 2020-07-05 ENCOUNTER — Telehealth: Payer: Self-pay

## 2020-07-05 LAB — TYPE AND SCREEN
ABO/RH(D): B POS
Antibody Screen: NEGATIVE
Unit division: 0

## 2020-07-05 LAB — BPAM RBC
Blood Product Expiration Date: 202111262359
ISSUE DATE / TIME: 202111040932
Unit Type and Rh: 5100

## 2020-07-05 NOTE — Telephone Encounter (Signed)
-----   Message from Lequita Asal, MD sent at 07/03/2020  4:56 PM EDT ----- Regarding: Please call patient  Calcium is low.  Ensure he is taking his calcium.  M ----- Message ----- From: Buel Ream, Lab In [place] Sent: 07/03/2020   9:28 AM EDT To: Lequita Asal, MD

## 2020-07-05 NOTE — Telephone Encounter (Signed)
Left a message to inform the patient per Dr Mike Gip his calcium is low and i nee to remind him to take his calcium. Any question or concerns please call the office.

## 2020-07-08 ENCOUNTER — Other Ambulatory Visit: Payer: Self-pay

## 2020-07-08 ENCOUNTER — Other Ambulatory Visit: Payer: Self-pay | Admitting: Hematology and Oncology

## 2020-07-08 ENCOUNTER — Inpatient Hospital Stay: Payer: Medicare Other

## 2020-07-08 DIAGNOSIS — D4621 Refractory anemia with excess of blasts 1: Secondary | ICD-10-CM | POA: Diagnosis not present

## 2020-07-08 DIAGNOSIS — D469 Myelodysplastic syndrome, unspecified: Secondary | ICD-10-CM

## 2020-07-08 DIAGNOSIS — D649 Anemia, unspecified: Secondary | ICD-10-CM

## 2020-07-08 LAB — CBC WITH DIFFERENTIAL/PLATELET
Abs Immature Granulocytes: 0.13 10*3/uL — ABNORMAL HIGH (ref 0.00–0.07)
Basophils Absolute: 0 10*3/uL (ref 0.0–0.1)
Basophils Relative: 0 %
Eosinophils Absolute: 0 10*3/uL (ref 0.0–0.5)
Eosinophils Relative: 0 %
HCT: 20.7 % — ABNORMAL LOW (ref 39.0–52.0)
Hemoglobin: 6.8 g/dL — ABNORMAL LOW (ref 13.0–17.0)
Immature Granulocytes: 4 %
Lymphocytes Relative: 31 %
Lymphs Abs: 1 10*3/uL (ref 0.7–4.0)
MCH: 28 pg (ref 26.0–34.0)
MCHC: 32.9 g/dL (ref 30.0–36.0)
MCV: 85.2 fL (ref 80.0–100.0)
Monocytes Absolute: 0.5 10*3/uL (ref 0.1–1.0)
Monocytes Relative: 17 %
Neutro Abs: 1.5 10*3/uL — ABNORMAL LOW (ref 1.7–7.7)
Neutrophils Relative %: 48 %
Platelets: 145 10*3/uL — ABNORMAL LOW (ref 150–400)
RBC: 2.43 MIL/uL — ABNORMAL LOW (ref 4.22–5.81)
RDW: 14.6 % (ref 11.5–15.5)
WBC: 3.1 10*3/uL — ABNORMAL LOW (ref 4.0–10.5)
nRBC: 1 % — ABNORMAL HIGH (ref 0.0–0.2)

## 2020-07-08 LAB — SAMPLE TO BLOOD BANK

## 2020-07-08 LAB — PREPARE RBC (CROSSMATCH)

## 2020-07-09 ENCOUNTER — Inpatient Hospital Stay: Payer: Medicare Other

## 2020-07-09 DIAGNOSIS — D649 Anemia, unspecified: Secondary | ICD-10-CM

## 2020-07-09 DIAGNOSIS — D469 Myelodysplastic syndrome, unspecified: Secondary | ICD-10-CM

## 2020-07-09 DIAGNOSIS — D4621 Refractory anemia with excess of blasts 1: Secondary | ICD-10-CM | POA: Diagnosis not present

## 2020-07-09 MED ORDER — ACETAMINOPHEN 325 MG PO TABS: 650.0000 mg | ORAL_TABLET | Freq: Once | ORAL | Status: DC

## 2020-07-09 MED ORDER — SODIUM CHLORIDE 0.9% IV SOLUTION
250.0000 mL | Freq: Once | INTRAVENOUS | Status: AC
  Administered 2020-07-09: 250 mL via INTRAVENOUS
  Filled 2020-07-09: qty 250

## 2020-07-09 MED ORDER — DIPHENHYDRAMINE HCL 25 MG PO CAPS: 25.0000 mg | ORAL_CAPSULE | Freq: Once | ORAL | Status: DC

## 2020-07-09 NOTE — Progress Notes (Signed)
Patient received 1 unit PRBC in the clinic today without difficulty. Patient tolerated well and was discharged to home in stable condition.

## 2020-07-10 LAB — BPAM RBC
Blood Product Expiration Date: 202112022359
ISSUE DATE / TIME: 202111090951
Unit Type and Rh: 5100

## 2020-07-10 LAB — TYPE AND SCREEN
ABO/RH(D): B POS
Antibody Screen: NEGATIVE
Unit division: 0

## 2020-07-15 ENCOUNTER — Other Ambulatory Visit: Payer: Self-pay

## 2020-07-15 ENCOUNTER — Other Ambulatory Visit: Payer: Self-pay | Admitting: Hematology and Oncology

## 2020-07-15 ENCOUNTER — Inpatient Hospital Stay: Payer: Medicare Other

## 2020-07-15 DIAGNOSIS — D4621 Refractory anemia with excess of blasts 1: Secondary | ICD-10-CM | POA: Diagnosis not present

## 2020-07-15 DIAGNOSIS — D469 Myelodysplastic syndrome, unspecified: Secondary | ICD-10-CM

## 2020-07-15 DIAGNOSIS — D649 Anemia, unspecified: Secondary | ICD-10-CM

## 2020-07-15 LAB — CBC WITH DIFFERENTIAL/PLATELET
Abs Immature Granulocytes: 0.22 10*3/uL — ABNORMAL HIGH (ref 0.00–0.07)
Basophils Absolute: 0 10*3/uL (ref 0.0–0.1)
Basophils Relative: 0 %
Eosinophils Absolute: 0 10*3/uL (ref 0.0–0.5)
Eosinophils Relative: 1 %
HCT: 21.1 % — ABNORMAL LOW (ref 39.0–52.0)
Hemoglobin: 7 g/dL — ABNORMAL LOW (ref 13.0–17.0)
Immature Granulocytes: 7 %
Lymphocytes Relative: 32 %
Lymphs Abs: 1 10*3/uL (ref 0.7–4.0)
MCH: 27.7 pg (ref 26.0–34.0)
MCHC: 33.2 g/dL (ref 30.0–36.0)
MCV: 83.4 fL (ref 80.0–100.0)
Monocytes Absolute: 0.6 10*3/uL (ref 0.1–1.0)
Monocytes Relative: 19 %
Neutro Abs: 1.4 10*3/uL — ABNORMAL LOW (ref 1.7–7.7)
Neutrophils Relative %: 41 %
Platelets: 142 10*3/uL — ABNORMAL LOW (ref 150–400)
RBC: 2.53 MIL/uL — ABNORMAL LOW (ref 4.22–5.81)
RDW: 14.3 % (ref 11.5–15.5)
WBC: 3.2 10*3/uL — ABNORMAL LOW (ref 4.0–10.5)
nRBC: 1.2 % — ABNORMAL HIGH (ref 0.0–0.2)

## 2020-07-15 LAB — PREPARE RBC (CROSSMATCH)

## 2020-07-15 LAB — SAMPLE TO BLOOD BANK

## 2020-07-16 ENCOUNTER — Inpatient Hospital Stay: Payer: Medicare Other | Attending: Hematology and Oncology

## 2020-07-16 DIAGNOSIS — D649 Anemia, unspecified: Secondary | ICD-10-CM

## 2020-07-16 DIAGNOSIS — D469 Myelodysplastic syndrome, unspecified: Secondary | ICD-10-CM

## 2020-07-16 DIAGNOSIS — D4621 Refractory anemia with excess of blasts 1: Secondary | ICD-10-CM | POA: Diagnosis not present

## 2020-07-16 MED ORDER — ACETAMINOPHEN 325 MG PO TABS: 650.0000 mg | ORAL_TABLET | Freq: Once | ORAL | Status: DC

## 2020-07-16 MED ORDER — SODIUM CHLORIDE 0.9% IV SOLUTION
250.0000 mL | Freq: Once | INTRAVENOUS | Status: AC
  Administered 2020-07-16: 250 mL via INTRAVENOUS
  Filled 2020-07-16: qty 250

## 2020-07-16 MED ORDER — DIPHENHYDRAMINE HCL 25 MG PO CAPS: 25.0000 mg | ORAL_CAPSULE | Freq: Once | ORAL | Status: DC

## 2020-07-16 NOTE — Progress Notes (Signed)
Patient receive 1 unit PRBC in clinic today; tolerated well; discharged in stable condition.

## 2020-07-17 LAB — TYPE AND SCREEN
ABO/RH(D): B POS
Antibody Screen: NEGATIVE
Unit division: 0

## 2020-07-17 LAB — BPAM RBC
Blood Product Expiration Date: 202112042359
ISSUE DATE / TIME: 202111161028
Unit Type and Rh: 7300

## 2020-07-22 ENCOUNTER — Other Ambulatory Visit: Payer: Self-pay

## 2020-07-22 ENCOUNTER — Other Ambulatory Visit: Payer: Self-pay | Admitting: Hematology and Oncology

## 2020-07-22 ENCOUNTER — Telehealth: Payer: Self-pay

## 2020-07-22 ENCOUNTER — Other Ambulatory Visit: Payer: Self-pay | Admitting: *Deleted

## 2020-07-22 ENCOUNTER — Inpatient Hospital Stay: Payer: Medicare Other

## 2020-07-22 DIAGNOSIS — D469 Myelodysplastic syndrome, unspecified: Secondary | ICD-10-CM

## 2020-07-22 DIAGNOSIS — D649 Anemia, unspecified: Secondary | ICD-10-CM

## 2020-07-22 DIAGNOSIS — D4621 Refractory anemia with excess of blasts 1: Secondary | ICD-10-CM | POA: Diagnosis not present

## 2020-07-22 LAB — CBC WITH DIFFERENTIAL/PLATELET
Abs Immature Granulocytes: 0.25 10*3/uL — ABNORMAL HIGH (ref 0.00–0.07)
Basophils Absolute: 0 10*3/uL (ref 0.0–0.1)
Basophils Relative: 0 %
Eosinophils Absolute: 0 10*3/uL (ref 0.0–0.5)
Eosinophils Relative: 1 %
HCT: 21.8 % — ABNORMAL LOW (ref 39.0–52.0)
Hemoglobin: 7.3 g/dL — ABNORMAL LOW (ref 13.0–17.0)
Immature Granulocytes: 7 %
Lymphocytes Relative: 26 %
Lymphs Abs: 1 10*3/uL (ref 0.7–4.0)
MCH: 28 pg (ref 26.0–34.0)
MCHC: 33.5 g/dL (ref 30.0–36.0)
MCV: 83.5 fL (ref 80.0–100.0)
Monocytes Absolute: 0.8 10*3/uL (ref 0.1–1.0)
Monocytes Relative: 22 %
Neutro Abs: 1.6 10*3/uL — ABNORMAL LOW (ref 1.7–7.7)
Neutrophils Relative %: 44 %
Platelets: 136 10*3/uL — ABNORMAL LOW (ref 150–400)
RBC: 2.61 MIL/uL — ABNORMAL LOW (ref 4.22–5.81)
RDW: 14.2 % (ref 11.5–15.5)
WBC: 3.7 10*3/uL — ABNORMAL LOW (ref 4.0–10.5)
nRBC: 0.5 % — ABNORMAL HIGH (ref 0.0–0.2)

## 2020-07-22 LAB — PREPARE RBC (CROSSMATCH)

## 2020-07-22 LAB — SAMPLE TO BLOOD BANK

## 2020-07-23 ENCOUNTER — Inpatient Hospital Stay: Payer: Medicare Other

## 2020-07-23 DIAGNOSIS — D4621 Refractory anemia with excess of blasts 1: Secondary | ICD-10-CM | POA: Diagnosis not present

## 2020-07-23 DIAGNOSIS — D469 Myelodysplastic syndrome, unspecified: Secondary | ICD-10-CM

## 2020-07-23 DIAGNOSIS — D649 Anemia, unspecified: Secondary | ICD-10-CM

## 2020-07-23 MED ORDER — SODIUM CHLORIDE 0.9% IV SOLUTION
250.0000 mL | Freq: Once | INTRAVENOUS | Status: AC
  Administered 2020-07-23: 250 mL via INTRAVENOUS
  Filled 2020-07-23: qty 250

## 2020-07-23 MED ORDER — DIPHENHYDRAMINE HCL 25 MG PO CAPS: 25.0000 mg | ORAL_CAPSULE | Freq: Once | ORAL | Status: DC

## 2020-07-23 MED ORDER — ACETAMINOPHEN 325 MG PO TABS: 650.0000 mg | ORAL_TABLET | Freq: Once | ORAL | Status: DC

## 2020-07-23 NOTE — Progress Notes (Signed)
Pt tolerated blood transfusion one unit without diff., VSS, d/c to home

## 2020-07-24 LAB — TYPE AND SCREEN
ABO/RH(D): B POS
Antibody Screen: NEGATIVE
Unit division: 0

## 2020-07-24 LAB — BPAM RBC
Blood Product Expiration Date: 202112162359
ISSUE DATE / TIME: 202111231356
Unit Type and Rh: 7300

## 2020-07-29 ENCOUNTER — Other Ambulatory Visit: Payer: Self-pay | Admitting: Hematology and Oncology

## 2020-07-29 ENCOUNTER — Other Ambulatory Visit: Payer: Self-pay

## 2020-07-29 ENCOUNTER — Inpatient Hospital Stay: Payer: Medicare Other

## 2020-07-29 ENCOUNTER — Other Ambulatory Visit: Payer: Self-pay | Admitting: *Deleted

## 2020-07-29 DIAGNOSIS — D4621 Refractory anemia with excess of blasts 1: Secondary | ICD-10-CM | POA: Diagnosis not present

## 2020-07-29 DIAGNOSIS — D469 Myelodysplastic syndrome, unspecified: Secondary | ICD-10-CM

## 2020-07-29 DIAGNOSIS — D630 Anemia in neoplastic disease: Secondary | ICD-10-CM

## 2020-07-29 LAB — CBC WITH DIFFERENTIAL/PLATELET
Abs Immature Granulocytes: 0.37 10*3/uL — ABNORMAL HIGH (ref 0.00–0.07)
Basophils Absolute: 0 10*3/uL (ref 0.0–0.1)
Basophils Relative: 0 %
Eosinophils Absolute: 0 10*3/uL (ref 0.0–0.5)
Eosinophils Relative: 1 %
HCT: 21.6 % — ABNORMAL LOW (ref 39.0–52.0)
Hemoglobin: 7 g/dL — ABNORMAL LOW (ref 13.0–17.0)
Immature Granulocytes: 7 %
Lymphocytes Relative: 29 %
Lymphs Abs: 1.5 10*3/uL (ref 0.7–4.0)
MCH: 27.9 pg (ref 26.0–34.0)
MCHC: 32.4 g/dL (ref 30.0–36.0)
MCV: 86.1 fL (ref 80.0–100.0)
Monocytes Absolute: 1.1 10*3/uL — ABNORMAL HIGH (ref 0.1–1.0)
Monocytes Relative: 21 %
Neutro Abs: 2.2 10*3/uL (ref 1.7–7.7)
Neutrophils Relative %: 42 %
Platelets: 126 10*3/uL — ABNORMAL LOW (ref 150–400)
RBC: 2.51 MIL/uL — ABNORMAL LOW (ref 4.22–5.81)
RDW: 15.4 % (ref 11.5–15.5)
WBC: 5.1 10*3/uL (ref 4.0–10.5)
nRBC: 0.8 % — ABNORMAL HIGH (ref 0.0–0.2)

## 2020-07-29 LAB — SAMPLE TO BLOOD BANK

## 2020-07-30 LAB — PREPARE RBC (CROSSMATCH)

## 2020-07-31 ENCOUNTER — Other Ambulatory Visit: Payer: Self-pay

## 2020-07-31 ENCOUNTER — Inpatient Hospital Stay: Payer: Medicare Other | Attending: Hematology and Oncology

## 2020-07-31 DIAGNOSIS — D4621 Refractory anemia with excess of blasts 1: Secondary | ICD-10-CM | POA: Diagnosis present

## 2020-07-31 DIAGNOSIS — I10 Essential (primary) hypertension: Secondary | ICD-10-CM | POA: Insufficient documentation

## 2020-07-31 DIAGNOSIS — E785 Hyperlipidemia, unspecified: Secondary | ICD-10-CM | POA: Insufficient documentation

## 2020-07-31 DIAGNOSIS — N400 Enlarged prostate without lower urinary tract symptoms: Secondary | ICD-10-CM | POA: Diagnosis not present

## 2020-07-31 DIAGNOSIS — D469 Myelodysplastic syndrome, unspecified: Secondary | ICD-10-CM

## 2020-07-31 DIAGNOSIS — E119 Type 2 diabetes mellitus without complications: Secondary | ICD-10-CM | POA: Insufficient documentation

## 2020-07-31 DIAGNOSIS — D630 Anemia in neoplastic disease: Secondary | ICD-10-CM

## 2020-07-31 MED ORDER — ACETAMINOPHEN 325 MG PO TABS: 650.0000 mg | ORAL_TABLET | Freq: Once | ORAL | Status: DC

## 2020-07-31 MED ORDER — SODIUM CHLORIDE 0.9% IV SOLUTION
250.0000 mL | Freq: Once | INTRAVENOUS | Status: AC
  Administered 2020-07-31: 250 mL via INTRAVENOUS
  Filled 2020-07-31: qty 250

## 2020-07-31 MED ORDER — DIPHENHYDRAMINE HCL 25 MG PO CAPS: 25.0000 mg | ORAL_CAPSULE | Freq: Once | ORAL | Status: DC

## 2020-07-31 NOTE — Progress Notes (Signed)
Pt tolerated two units of Packed cells well, VSS, d/ced to home

## 2020-08-01 LAB — TYPE AND SCREEN
ABO/RH(D): B POS
Antibody Screen: NEGATIVE
Unit division: 0
Unit division: 0

## 2020-08-01 LAB — BPAM RBC
Blood Product Expiration Date: 202112252359
Blood Product Expiration Date: 202112282359
ISSUE DATE / TIME: 202112010836
ISSUE DATE / TIME: 202112011051
Unit Type and Rh: 1700
Unit Type and Rh: 7300

## 2020-08-01 NOTE — Progress Notes (Signed)
[facility]  [address] [place], [place] [address] Phone: [phone]  Fax: [phone]   Clinic Day:  08/05/20   Referring physician: Lequita Asal, MD  Chief Complaint: Angeles Paolucci. is a 86 y.o. male with RAEB-1 who is seen for 5 week assessment.  HPI: The patient was last seen in the hematology clinic on 07/03/2020. At that time,  he had been "a little slower than normal".  He had felt tired for a few days. He had not been able to go on walks for a few days.  He denied chest pain and shortness of breath. His appetite was good. He slept well.  Exam was stable. Hematocrit was 20.9, hemoglobin 6.8, MCV 87.4, platelets 136,000, WBC 3,300 (ANC 1,400). Sodium was 131.  Chloride was 96. Glucose was 283. BUN was 26. Calcium was 8.4. Ferritin was 1,288.  He was transfused with 1 unit of blood the following day.  Labs followed: 07/08/2020: Hematocrit 20.7, hemoglobin 6.8, MCV 85.2, platelets 145,000, WBC 3,100 (ANC 1,500). 07/15/2020: Hematocrit 21.1, hemoglobin 7.0, MCV 83.4, platelets 142,000, WBC 3,200 (ANC 1,400). 07/22/2020: Hematocrit 21.8, hemoglobin 7.3, MCV 83.5, platelets 136,000, WBC 3,700 (ANC 1,600). 07/29/2020: Hematocrit 21.6, hemoglobin 7.0, MCV 86.1, platelets 126,000, WBC 5,100 (ANC 2,200).  He received 1 unit of PRBC on 07/09/2020, 07/16/2020, and 07/23/2020. He received 2 units on 07/31/2020.  During the interim, he has felt "much stronger" since receiving 2 units of blood. He states that he is not able to do everything he wants to do because he is trying to conserve his energy. He denies chest pain, shortness of breath, and any other symptoms.   Past Medical History:  Diagnosis Date  . Anemia   . Arthritis   . BPH (benign prostatic hypertrophy)   . Cancer ([place])   . Complication of anesthesia    nausea  . Diabetes type 2, controlled ([place])   . HOH (hard of hearing)    r and L ears-70% loss per pt  . Hyperlipidemia   .  Hypertension   . Myelodysplasia (myelodysplastic syndrome) ([place]) 2019    Past Surgical History:  Procedure Laterality Date  . APPENDECTOMY    . COLONOSCOPY  09/21/08   1 polyp found, tubular adenoma  . HAND SURGERY    . KNEE SURGERY Left     Family History  Problem Relation Age of Onset  . Aneurysm Mother   . Heart disease Father   . Cancer Brother        skin  . Heart disease Brother   . Heart disease Brother   . Cancer Sister        skin  . Aneurysm Brother   . Heart disease Brother   . Heart disease Sister   . Heart disease Sister   . COPD Sister   . Diabetes Sister     Social History:  reports that he has never smoked. He has never used smokeless tobacco. He reports that he does not drink alcohol and does not use drugs. 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 Patient denies known exposures to radiation on toxins. 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 He had his 25-year marriage anniversary on 08/23/2018.He is walking 1-35mles per day.[place]. The patient is accompanied by his wife today.  Allergies: No Known Allergies  Current Medications: Current Outpatient Medications  Medication Sig Dispense Refill  . aspirin 81 MG  chewable tablet Chew 81 mg by mouth daily.    . Calcium Carbonate-Vit D-Min (CALCIUM 1200 PO) Take 1,200 mg by mouth in the morning and at bedtime.    . Cranberry (THERACRAN PO) Take 1 tablet by mouth daily.     . cyanocobalamin 1000 MCG tablet Take 1,000 mcg by mouth daily.    . finasteride (PROSCAR) 5 MG tablet Take 5 mg by mouth daily.    Marland Kitchen glimepiride (AMARYL) 1 MG tablet Take 1 tablet by mouth daily after breakfast.    . glucose blood test strip FreeStyle Lite Strips    . LANCETS ULTRA FINE MISC Lancets,Ultra Thin 26 gauge    . lisinopril (PRINIVIL,ZESTRIL) 10 MG tablet Take 10 mg by mouth daily.    Marland Kitchen loperamide (IMODIUM) 2 MG capsule Take 1 capsule (2 mg total) by mouth  See admin instructions. With onset of loose stool, take 7m followed by 231mevery 2 hours until loose bowel movement stopped. Maximum: 16 mg/day 30 capsule 0  . meclizine (ANTIVERT) 25 MG tablet Take 25 mg by mouth 2 (two) times daily as needed.     . metformin (FORTAMET) 1000 MG (OSM) 24 hr tablet Take 1,000 mg by mouth 2 (two) times daily with a meal.    . Multiple Vitamins-Minerals (CENTRUM SILVER PO) Take by mouth.    . mupirocin ointment (BACTROBAN) 2 % Apply 1 application topically daily. With dressing changes 22 g 0  . Omega-3 Fatty Acids (FISH OIL PO) Take 1 tablet by mouth daily.     . ondansetron (ZOFRAN) 4 MG tablet Take 1 tablet (4 mg total) by mouth every 6 (six) hours as needed for nausea or vomiting. 30 tablet 2  . Polyethylene Glycol 3350 (MIRALAX PO) Take by mouth as needed.     . Clarnce Flockalmetto-Phytosterols (PROSTATE SR PO) Take 1 tablet by mouth daily.     . simvastatin (ZOCOR) 40 MG tablet Take 40 mg by mouth daily.     No current facility-administered medications for this visit.   Facility-Administered Medications Ordered in Other Visits  Medication Dose Route Frequency Provider Last Rate Last Admin  . acetaminophen (TYLENOL) tablet 650 mg  650 mg Oral Once CoLequita AsalMD      . acetaminophen (TYLENOL) tablet 650 mg  650 mg Oral Once CoLequita AsalMD      . acetaminophen (TYLENOL) tablet 650 mg  650 mg Oral Once CoLequita AsalMD      . acetaminophen (TYLENOL) tablet 650 mg  650 mg Oral Once CoLequita AsalMD      . diphenhydrAMINE (BENADRYL) capsule 25 mg  25 mg Oral Once CoLequita AsalMD      . diphenhydrAMINE (BENADRYL) capsule 25 mg  25 mg Oral Once CoNolon Stalls, MD      . diphenhydrAMINE (BENADRYL) capsule 25 mg  25 mg Oral Once CoNolon Stalls, MD      . diphenhydrAMINE (BENADRYL) capsule 25 mg  25 mg Oral Once CoLequita AsalMD        Review of Systems  Constitutional: Negative for chills, diaphoresis, fever,  malaise/fatigue and weight loss (up 2 lbs).       Feels "much stronger."  HENT: Positive for hearing loss. Negative for congestion, ear discharge, ear pain, nosebleeds, sinus pain, sore throat and tinnitus.   Eyes: Negative.  Negative for blurred vision.  Respiratory: Negative.  Negative for cough, hemoptysis, sputum production and shortness of breath.  Cardiovascular: Negative.  Negative for chest pain, palpitations and leg swelling.  Gastrointestinal: Negative.  Negative for abdominal pain, blood in stool, constipation, diarrhea, heartburn, melena, nausea and vomiting.  Genitourinary: Negative.  Negative for dysuria, frequency, hematuria and urgency.  Musculoskeletal: Negative.  Negative for back pain, joint pain, myalgias and neck pain.  Skin: Negative for itching and rash.  Neurological: Negative.  Negative for dizziness, tingling, sensory change, weakness and headaches.  Endo/Heme/Allergies: Does not bruise/bleed easily.       Diabetes.  Psychiatric/Behavioral: Negative.  Negative for depression and memory loss. The patient is not nervous/anxious and does not have insomnia.   All other systems reviewed and are negative.  Performance status (ECOG): 1  Vitals Blood pressure (!) 143/50, pulse 73, temperature 98.2 F (36.8 C), temperature source Tympanic, resp. rate 18, weight 180 lb 7.1 oz (81.8 kg), SpO2 98 %.   Physical Exam Vitals and nursing note reviewed.  Constitutional:      General: He is not in acute distress.    Appearance: He is well-developed. He is not diaphoretic.  HENT:     Head: Normocephalic and atraumatic.     Comments: Male pattern baldness.    Mouth/Throat:     Mouth: Mucous membranes are moist.     Pharynx: Oropharynx is clear. No oropharyngeal exudate.  Eyes:     General: No scleral icterus.    Conjunctiva/sclera: Conjunctivae normal.     Pupils: Pupils are equal, round, and reactive to light.     Comments: Gold rimmed glasses.  Blue eyes.    Cardiovascular:     Rate and Rhythm: Normal rate and regular rhythm.     Heart sounds: Normal heart sounds. No murmur heard.   Pulmonary:     Effort: Pulmonary effort is normal. No respiratory distress.     Breath sounds: Normal breath sounds. No wheezing or rales.  Chest:     Chest wall: No tenderness.  Abdominal:     General: Bowel sounds are normal. There is no distension.     Palpations: Abdomen is soft. There is no hepatomegaly, splenomegaly or mass.     Tenderness: There is no abdominal tenderness. There is no guarding or rebound.  Musculoskeletal:        General: No swelling or tenderness. Normal range of motion.     Cervical back: Normal range of motion and neck supple.  Lymphadenopathy:     Head:     Right side of head: No preauricular, posterior auricular or occipital adenopathy.     Left side of head: No preauricular, posterior auricular or occipital adenopathy.     Cervical: No cervical adenopathy.     Upper Body:     Right upper body: No supraclavicular or axillary adenopathy.     Left upper body: No supraclavicular or axillary adenopathy.     Lower Body: No right inguinal adenopathy. No left inguinal adenopathy.  Skin:    General: Skin is warm and dry.  Neurological:     Mental Status: He is alert and oriented to person, place, and time.  Psychiatric:        Behavior: Behavior normal.        Thought Content: Thought content normal.        Judgment: Judgment normal.    Appointment on 08/05/2020  Component Date Value Ref Range Status  . Sodium 08/05/2020 130* 135 - 145 mmol/L Final  . Potassium 08/05/2020 4.2  3.5 - 5.1 mmol/L Final  . Chloride 08/05/2020 96*  98 - 111 mmol/L Final  . CO2 08/05/2020 26  22 - 32 mmol/L Final  . Glucose, Bld 08/05/2020 358* 70 - 99 mg/dL Final   Glucose reference range applies only to samples taken after fasting for at least 8 hours.  . BUN 08/05/2020 19  8 - 23 mg/dL Final  . Creatinine, Ser 08/05/2020 1.05  0.61 - 1.24 mg/dL  Final  . Calcium 08/05/2020 8.6* 8.9 - 10.3 mg/dL Final  . Total Protein 08/05/2020 7.2  6.5 - 8.1 g/dL Final  . Albumin 08/05/2020 3.9  3.5 - 5.0 g/dL Final  . AST 08/05/2020 25  15 - 41 U/L Final  . ALT 08/05/2020 31  0 - 44 U/L Final  . Alkaline Phosphatase 08/05/2020 40  38 - 126 U/L Final  . Total Bilirubin 08/05/2020 0.6  0.3 - 1.2 mg/dL Final  . GFR, Estimated 08/05/2020 >60  >60 mL/min Final   Comment: (NOTE) Calculated using the CKD-EPI Creatinine Equation (2021)   . Anion gap 08/05/2020 8  5 - 15 Final   Performed at [facility], [address] [place], [place] [address]  . WBC 08/05/2020 6.7  4.0 - 10.5 K/uL Final  . RBC 08/05/2020 2.81* 4.22 - 5.81 MIL/uL Final  . Hemoglobin 08/05/2020 8.1* 13.0 - 17.0 g/dL Final  . HCT 08/05/2020 24.0* 39 - 52 % Final  . MCV 08/05/2020 85.4  80.0 - 100.0 fL Final  . MCH 08/05/2020 28.8  26.0 - 34.0 pg Final  . MCHC 08/05/2020 33.8  30.0 - 36.0 g/dL Final  . RDW 08/05/2020 14.9  11.5 - 15.5 % Final  . Platelets 08/05/2020 116* 150 - 400 K/uL Final   Comment: Immature Platelet Fraction may be clinically indicated, consider ordering this additional test [id number]   . nRBC 08/05/2020 0.5* 0.0 - 0.2 % Final  . Neutrophils Relative % 08/05/2020 45  % Final  . Neutro Abs 08/05/2020 3.0  1.7 - 7.7 K/uL Final  . Lymphocytes Relative 08/05/2020 25  % Final  . Lymphs Abs 08/05/2020 1.7  0.7 - 4.0 K/uL Final  . Monocytes Relative 08/05/2020 22  % Final  . Monocytes Absolute 08/05/2020 1.5* 0.1 - 1.0 K/uL Final  . Eosinophils Relative 08/05/2020 1  % Final  . Eosinophils Absolute 08/05/2020 0.1  0.0 - 0.5 K/uL Final  . Basophils Relative 08/05/2020 0  % Final  . Basophils Absolute 08/05/2020 0.0  0.0 - 0.1 K/uL Final  . Immature Granulocytes 08/05/2020 7  % Final  . Abs Immature Granulocytes 08/05/2020 0.46* 0.00 - 0.07 K/uL Final   Performed at [facility] [address] [place], [place] [address]     Assessment:  Paul Pham. is a 86 y.o. 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 Bone marrowon 09/22/2017 revealed a hypercellular for age with dyspoietic changes variably involving myeloid cell lines, but with main involvement of the granulocytic/monocytic cell line. This was associated with bone marrow monocytosis and borderline number to slight increase in blastic cells. The overall changes favor a primary myeloid neoplasm, particularly a myelodysplastic syndrome especially refractory anemia with excess blasts (RAEB-1) or possibly refractory cytopenia with multilineage dysplasia. Consideration was also given to an evolving myelodysplastic/myeloproliferative neoplasm such as chronic myelomonocytic leukemia but the lack of absolute peripheral monocytosis precluded such a diagnosis at this time. Flow cytometrywas negative. Cytogeneticswere normal (46, XY). Foundation one was positive for ASXL1 [id number]*34, EZH2 Splice site 196-2I>W, RUNX1 [id number]. IPSS-R4.5, intermediate group.  Anemia work-up on 08/17/2017: Vitamin B12 (658),  folate (22.0), and TSH (2.688). Retic was 2.6%. Haptoglobin was 116 on 08/19/2017. Epo levelwas 104.8 on 10/05/2017.  Ferritin has been followed: 106 on 08/17/2017, 131 on 11/04/2017, 144 on 05/30/2018, 394 on 09/01/2018, 441 on 11/28/2018, 394 on 01/02/2019, 480 on 02/06/2019, 392 on 03/13/2019, 527 on 04/17/2019, 533 on 05/29/2019, 476 on 06/05/2019, 517 on 07/17/2019, 685 on 08/28/2019, 724 on 10/16/2019, 749 on 01/08/2020, 95 on 02/08/2020, 811 on 03/07/2020, 1189 on 04/17/2020, 1203 on 05/22/2020, 1483 on 06/19/2020, and 1288 on 07/03/2020. Iron saturation was 34% on 08/17/2017 and 89% on 09/01/2018.  Bone marrowon 09/22/2017 revealed a hypercellular for age with dyspoietic changes variably involving myeloid cell lines, but with main involvement of the granulocytic/monocytic cell line. This was associated with bone marrow monocytosis and borderline number to slight increase in  blasts (5%). The overall changes favor a primary myeloid neoplasm, particularly a myelodysplastic syndrome especially refractory anemia with excess blasts (RAEB-1) or possibly refractory cytopenia with multilineage dysplasia. Consideration was also given to an evolving myelodysplastic/myeloproliferative neoplasm such as chronic myelomonocytic leukemia but the lack of absolute peripheral monocytosis precluded such a diagnosis at this time. Flow cytometrywas negative. Cytogeneticswere normal (46, XY). Foundation One was positive for ASXL1 O962XB*28, EZH2 Splice site 413-2G>M, RUNX1 [id number].  He has received20cycles ofVidaza(11/04/2017 - 08/22/2018; 10/24/2018-10/23/2019). He has received Aranesp(initially 150 mcg every 2 weeks post chemotherapy then 300 mcg every 1-2 weeks after cycle #3; last 09/19/2018). He last received Aranesp on02/15/2021. He has received 1-2units of PRBCswith each cycle.He has received51 units of PRBCs since 08/17/2017 (last 07/31/2020).  Bone marrowon 08/12/2018 revealed a hypercellular marrow with dyspoietic changes. There was a borderline number of blasts (5%) seen by morphology. The findings were similar to previous biopsy although the cellularity is slightly higher in the current material.consistent with previously known myelodysplastic syndrome. Flow cytometry was negative. Cytogenetics were normal (46, XY).  Bone marrowon 11/27/2019 revealed a hypercellular bone marrow (60-80%) with myelodysplastic syndrome. The morphologic features were similar to the previous biopsy. There was no definite increase in blasts. Flow cytometry revealed no significant CD34-positive blastic population, increase in monocytic cells (up to 18%), Tcells with nonspecific phenotypic changes, and no monoclonal B-cell population identified.Cytogenetics were normal (46, XY). FISH was negative. Next-Gen myeloid disorder profilerevealsASXL1G636f*15,EZH2splice site  1010-2V>O,ZDGUYQsite cI.347-4QVZ splice site [id number].[id number],YSAY3K160F*09 There were no abnormalities in FLT3, IDH1, IDH2, NPM1.  [facility] a hypercellular bone marrow (80%) with persistent morphologic dyspoiesis and 9% blasts/promonocytes by manual differential.  [place] and 09/[place]/2019.  Symptomatically, he has felt "much stronger" since receiving 2 units of PRBCs. He denies chest pain, shortness of breath, and any other symptoms.  Exam is stable.   Plan: 1.   Labs today: CBC with diff, CMP, ferritin, hold tube 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  Helast received Vidazaon 10/23/2019. Treatmentgoalwas todecrease transfusion dependence and prevent acceleration of disease (blasts). [facility] was hypercellular (80%) with persistent morphologic dyspoiesis and 9% blasts/promonocytes by manual differential.  He remains on supportive care (transfusion support alone).   He received 5 units of PRBCs since his last visit.  Hematocrit 24.0.  Hemoglobin 8.1.  MCV 85.4 today.  He denies any bleeding  Continue to monitor CBC weekly due to symptomatic anemia. 3. Iron overload [phone]. He has received 51units of PRBCs to date (08/17/2017 -07/31/2020). Previously we discussed Jadenu potential side effects of myelosuppression renal insufficiency.  He remains uninterested  in initiation of Jadenu secondary to potential side effects. 4.   RTC on 12/08 for Hgb check and 1 unit of PRBCs. 5.   RTC 12/15, 12/22, 12/29, 01/05, 01/12 for labs (CBC, hold tube) and +/- PRBC transfusion the next day. 6.   RTC on 09/18/2020 for MD assessment, labs (CBC with diff, CMP, hold tube) and +/- PRBCs transfusion the following day.  I discussed the  assessment and treatment plan with the patient.  The patient was provided an opportunity to ask questions and all were answered.  The patient agreed with the plan and demonstrated an understanding of the instructions.  The patient was advised to call back if the symptoms worsen or if the condition fails to improve as anticipated.  I provided 16 minutes of face-to-face time during this this encounter and > 50% was spent counseling as documented under my assessment and plan.  An additional 10-15 minutes were spent reviewing his chart (Epic and Care Everywhere) including notes, labs, and imaging studies and writing for transfusion support.    Lequita Asal, MD, PhD    08/05/2020, 11:00 AM  I, Mirian Mo Tufford, am acting as a Education administrator for [employer]. Mike Gip, MD.   I,  C. Mike Gip, MD, have reviewed the above documentation for accuracy and completeness, and I agree with the above.

## 2020-08-05 ENCOUNTER — Other Ambulatory Visit: Payer: Self-pay

## 2020-08-05 ENCOUNTER — Inpatient Hospital Stay: Payer: Medicare Other

## 2020-08-05 ENCOUNTER — Other Ambulatory Visit: Payer: Self-pay | Admitting: Hematology and Oncology

## 2020-08-05 ENCOUNTER — Inpatient Hospital Stay (HOSPITAL_BASED_OUTPATIENT_CLINIC_OR_DEPARTMENT_OTHER): Payer: Medicare Other | Admitting: Hematology and Oncology

## 2020-08-05 ENCOUNTER — Encounter: Payer: Self-pay | Admitting: Hematology and Oncology

## 2020-08-05 VITALS — BP 143/50 | HR 73 | Temp 98.2°F | Resp 18 | Wt 180.4 lb

## 2020-08-05 DIAGNOSIS — Z7189 Other specified counseling: Secondary | ICD-10-CM | POA: Diagnosis not present

## 2020-08-05 DIAGNOSIS — D469 Myelodysplastic syndrome, unspecified: Secondary | ICD-10-CM

## 2020-08-05 DIAGNOSIS — D4621 Refractory anemia with excess of blasts 1: Secondary | ICD-10-CM | POA: Diagnosis not present

## 2020-08-05 DIAGNOSIS — D649 Anemia, unspecified: Secondary | ICD-10-CM

## 2020-08-05 LAB — CBC WITH DIFFERENTIAL/PLATELET
Abs Immature Granulocytes: 0.46 10*3/uL — ABNORMAL HIGH (ref 0.00–0.07)
Basophils Absolute: 0 10*3/uL (ref 0.0–0.1)
Basophils Relative: 0 %
Eosinophils Absolute: 0.1 10*3/uL (ref 0.0–0.5)
Eosinophils Relative: 1 %
HCT: 24 % — ABNORMAL LOW (ref 39.0–52.0)
Hemoglobin: 8.1 g/dL — ABNORMAL LOW (ref 13.0–17.0)
Immature Granulocytes: 7 %
Lymphocytes Relative: 25 %
Lymphs Abs: 1.7 10*3/uL (ref 0.7–4.0)
MCH: 28.8 pg (ref 26.0–34.0)
MCHC: 33.8 g/dL (ref 30.0–36.0)
MCV: 85.4 fL (ref 80.0–100.0)
Monocytes Absolute: 1.5 10*3/uL — ABNORMAL HIGH (ref 0.1–1.0)
Monocytes Relative: 22 %
Neutro Abs: 3 10*3/uL (ref 1.7–7.7)
Neutrophils Relative %: 45 %
Platelets: 116 10*3/uL — ABNORMAL LOW (ref 150–400)
RBC: 2.81 MIL/uL — ABNORMAL LOW (ref 4.22–5.81)
RDW: 14.9 % (ref 11.5–15.5)
WBC: 6.7 10*3/uL (ref 4.0–10.5)
nRBC: 0.5 % — ABNORMAL HIGH (ref 0.0–0.2)

## 2020-08-05 LAB — SAMPLE TO BLOOD BANK

## 2020-08-05 LAB — COMPREHENSIVE METABOLIC PANEL
ALT: 31 U/L (ref 0–44)
AST: 25 U/L (ref 15–41)
Albumin: 3.9 g/dL (ref 3.5–5.0)
Alkaline Phosphatase: 40 U/L (ref 38–126)
Anion gap: 8 (ref 5–15)
BUN: 19 mg/dL (ref 8–23)
CO2: 26 mmol/L (ref 22–32)
Calcium: 8.6 mg/dL — ABNORMAL LOW (ref 8.9–10.3)
Chloride: 96 mmol/L — ABNORMAL LOW (ref 98–111)
Creatinine, Ser: 1.05 mg/dL (ref 0.61–1.24)
GFR, Estimated: >60 mL/min (ref >60)
Glucose, Bld: 358 mg/dL — ABNORMAL HIGH (ref 70–99)
Potassium: 4.2 mmol/L (ref 3.5–5.1)
Sodium: 130 mmol/L — ABNORMAL LOW (ref 135–145)
Total Bilirubin: 0.6 mg/dL (ref 0.3–1.2)
Total Protein: 7.2 g/dL (ref 6.5–8.1)

## 2020-08-05 LAB — FERRITIN: Ferritin: 1627 ng/mL — ABNORMAL HIGH (ref 24–336)

## 2020-08-06 ENCOUNTER — Ambulatory Visit: Payer: TRICARE For Life (TFL)

## 2020-08-06 LAB — PREPARE RBC (CROSSMATCH)

## 2020-08-07 ENCOUNTER — Other Ambulatory Visit: Payer: Self-pay

## 2020-08-07 ENCOUNTER — Inpatient Hospital Stay: Payer: Medicare Other

## 2020-08-07 DIAGNOSIS — D649 Anemia, unspecified: Secondary | ICD-10-CM

## 2020-08-07 DIAGNOSIS — D4621 Refractory anemia with excess of blasts 1: Secondary | ICD-10-CM | POA: Diagnosis not present

## 2020-08-07 DIAGNOSIS — D469 Myelodysplastic syndrome, unspecified: Secondary | ICD-10-CM

## 2020-08-07 LAB — TYPE AND SCREEN
ABO/RH(D): B POS
Antibody Screen: NEGATIVE
Unit division: 0

## 2020-08-07 LAB — BPAM RBC
Blood Product Expiration Date: 202201032359
Unit Type and Rh: 7300

## 2020-08-07 LAB — HEMOGLOBIN: Hemoglobin: 8 g/dL — ABNORMAL LOW (ref 13.0–17.0)

## 2020-08-07 NOTE — Progress Notes (Signed)
No transfusion needed today. HGB is 8.0, and patient stated that he is feeling pretty good. RTC next week for labs and possible transfusion per Dr. Mike Gip.

## 2020-08-09 ENCOUNTER — Other Ambulatory Visit: Payer: Self-pay

## 2020-08-14 ENCOUNTER — Inpatient Hospital Stay: Payer: Medicare Other

## 2020-08-14 ENCOUNTER — Other Ambulatory Visit: Payer: Self-pay | Admitting: Hematology and Oncology

## 2020-08-14 ENCOUNTER — Telehealth: Payer: Self-pay | Admitting: *Deleted

## 2020-08-14 ENCOUNTER — Other Ambulatory Visit: Payer: Self-pay

## 2020-08-14 DIAGNOSIS — D4621 Refractory anemia with excess of blasts 1: Secondary | ICD-10-CM | POA: Diagnosis not present

## 2020-08-14 DIAGNOSIS — D649 Anemia, unspecified: Secondary | ICD-10-CM

## 2020-08-14 DIAGNOSIS — D469 Myelodysplastic syndrome, unspecified: Secondary | ICD-10-CM

## 2020-08-14 LAB — CBC WITH DIFFERENTIAL/PLATELET
Abs Immature Granulocytes: 0.66 10*3/uL — ABNORMAL HIGH (ref 0.00–0.07)
Basophils Absolute: 0 10*3/uL (ref 0.0–0.1)
Basophils Relative: 0 %
Eosinophils Absolute: 0.1 10*3/uL (ref 0.0–0.5)
Eosinophils Relative: 1 %
HCT: 20.7 % — ABNORMAL LOW (ref 39.0–52.0)
Hemoglobin: 6.9 g/dL — ABNORMAL LOW (ref 13.0–17.0)
Immature Granulocytes: 8 %
Lymphocytes Relative: 22 %
Lymphs Abs: 1.9 10*3/uL (ref 0.7–4.0)
MCH: 28.6 pg (ref 26.0–34.0)
MCHC: 33.3 g/dL (ref 30.0–36.0)
MCV: 85.9 fL (ref 80.0–100.0)
Monocytes Absolute: 2.1 10*3/uL — ABNORMAL HIGH (ref 0.1–1.0)
Monocytes Relative: 25 %
Neutro Abs: 3.8 10*3/uL (ref 1.7–7.7)
Neutrophils Relative %: 44 %
Platelets: 103 10*3/uL — ABNORMAL LOW (ref 150–400)
RBC: 2.41 MIL/uL — ABNORMAL LOW (ref 4.22–5.81)
RDW: 14.7 % (ref 11.5–15.5)
Smear Review: NORMAL
WBC: 8.5 10*3/uL (ref 4.0–10.5)
nRBC: 0.6 % — ABNORMAL HIGH (ref 0.0–0.2)

## 2020-08-14 LAB — SAMPLE TO BLOOD BANK

## 2020-08-14 LAB — PREPARE RBC (CROSSMATCH)

## 2020-08-14 NOTE — Telephone Encounter (Signed)
Spoke to patient's wife via telephone. Gave her his lab results from today. HGB 6.9. Per patient's wife, he would like to get 2 units of blood tomorrow because he is very weak. Informed Dr. Mike Gip. She will put orders in for 2 units. Patient to be here at 9:00am tomorrow. Patient's wife verbalized understanding.

## 2020-08-15 ENCOUNTER — Inpatient Hospital Stay: Payer: Medicare Other

## 2020-08-15 DIAGNOSIS — D4621 Refractory anemia with excess of blasts 1: Secondary | ICD-10-CM | POA: Diagnosis not present

## 2020-08-15 DIAGNOSIS — D469 Myelodysplastic syndrome, unspecified: Secondary | ICD-10-CM

## 2020-08-15 DIAGNOSIS — D649 Anemia, unspecified: Secondary | ICD-10-CM

## 2020-08-15 MED ORDER — DIPHENHYDRAMINE HCL 25 MG PO CAPS: 25.0000 mg | ORAL_CAPSULE | Freq: Once | ORAL | Status: DC

## 2020-08-15 MED ORDER — ACETAMINOPHEN 325 MG PO TABS: 650.0000 mg | ORAL_TABLET | Freq: Once | ORAL | Status: DC

## 2020-08-15 MED ORDER — SODIUM CHLORIDE 0.9% IV SOLUTION
250.0000 mL | Freq: Once | INTRAVENOUS | Status: AC
  Administered 2020-08-15: 09:00:00 250 mL via INTRAVENOUS
  Filled 2020-08-15: qty 250

## 2020-08-15 NOTE — Progress Notes (Signed)
Patient received prescribed treatment in clinic. Tolerated well. Patient stable at discharge.UNMATCHED BLOOD PRODUCT NOTE

## 2020-08-15 NOTE — Progress Notes (Signed)
UNMATCHED BLOOD PRODUCT NOTE  Compare the patient ID on the blood tag to the patient ID on the hospital armband and Blood Bank armband. Then confirm the unit number on the blood tag matches the unit number on the blood product.  If a discrepancy is discovered return the product to blood bank immediately.   Blood Product Type: Packed Red Blood Cells  Unit #: [id number]  Product Code #: G0982U67   Start Time: 5198  Starting Rate: 120 ml/hr  Rate increase/decreased  (if applicable):    242  ml/hr  Rate changed time (if applicable): 9980  Stop Time: 6999  All Other Documentation should be documented within the Blood Admin Flowsheet per policy.

## 2020-08-16 LAB — TYPE AND SCREEN
ABO/RH(D): B POS
Antibody Screen: NEGATIVE
Unit division: 0
Unit division: 0

## 2020-08-16 LAB — BPAM RBC
Blood Product Expiration Date: 202201032359
Blood Product Expiration Date: 202201112359
ISSUE DATE / TIME: 202112160912
ISSUE DATE / TIME: 202112161123
Unit Type and Rh: 5100
Unit Type and Rh: 7300

## 2020-08-21 ENCOUNTER — Inpatient Hospital Stay: Payer: Medicare Other

## 2020-08-21 ENCOUNTER — Other Ambulatory Visit: Payer: Self-pay

## 2020-08-21 ENCOUNTER — Telehealth: Payer: Self-pay | Admitting: *Deleted

## 2020-08-21 DIAGNOSIS — D469 Myelodysplastic syndrome, unspecified: Secondary | ICD-10-CM

## 2020-08-21 DIAGNOSIS — D4621 Refractory anemia with excess of blasts 1: Secondary | ICD-10-CM | POA: Diagnosis not present

## 2020-08-21 LAB — CBC WITH DIFFERENTIAL/PLATELET
Abs Immature Granulocytes: 0.73 10*3/uL — ABNORMAL HIGH (ref 0.00–0.07)
Basophils Absolute: 0 10*3/uL (ref 0.0–0.1)
Basophils Relative: 0 %
Eosinophils Absolute: 0.1 10*3/uL (ref 0.0–0.5)
Eosinophils Relative: 1 %
HCT: 24.7 % — ABNORMAL LOW (ref 39.0–52.0)
Hemoglobin: 8.3 g/dL — ABNORMAL LOW (ref 13.0–17.0)
Immature Granulocytes: 8 %
Lymphocytes Relative: 19 %
Lymphs Abs: 1.7 10*3/uL (ref 0.7–4.0)
MCH: 28.4 pg (ref 26.0–34.0)
MCHC: 33.6 g/dL (ref 30.0–36.0)
MCV: 84.6 fL (ref 80.0–100.0)
Monocytes Absolute: 2.4 10*3/uL — ABNORMAL HIGH (ref 0.1–1.0)
Monocytes Relative: 27 %
Neutro Abs: 3.9 10*3/uL (ref 1.7–7.7)
Neutrophils Relative %: 45 %
Platelets: 111 10*3/uL — ABNORMAL LOW (ref 150–400)
RBC: 2.92 MIL/uL — ABNORMAL LOW (ref 4.22–5.81)
RDW: 14.6 % (ref 11.5–15.5)
WBC: 8.8 10*3/uL (ref 4.0–10.5)
nRBC: 0.3 % — ABNORMAL HIGH (ref 0.0–0.2)

## 2020-08-21 LAB — SAMPLE TO BLOOD BANK

## 2020-08-21 NOTE — Telephone Encounter (Signed)
Spoke to patient's wife and gave her lab results. No transfusion needed tomorrow. HGB 8.3. Patient to keep scheduled appt. For 08/28/20. Patient to call us if he becomes more symptomatic before then. Patient's wife verbalized understanding.

## 2020-08-22 ENCOUNTER — Inpatient Hospital Stay: Payer: Medicare Other

## 2020-08-28 ENCOUNTER — Inpatient Hospital Stay: Payer: Medicare Other

## 2020-08-28 ENCOUNTER — Other Ambulatory Visit: Payer: Self-pay

## 2020-08-28 ENCOUNTER — Telehealth: Payer: Self-pay

## 2020-08-28 ENCOUNTER — Other Ambulatory Visit: Payer: Self-pay | Admitting: Hematology and Oncology

## 2020-08-28 DIAGNOSIS — D469 Myelodysplastic syndrome, unspecified: Secondary | ICD-10-CM

## 2020-08-28 DIAGNOSIS — D649 Anemia, unspecified: Secondary | ICD-10-CM

## 2020-08-28 DIAGNOSIS — D4621 Refractory anemia with excess of blasts 1: Secondary | ICD-10-CM | POA: Diagnosis not present

## 2020-08-28 LAB — CBC WITH DIFFERENTIAL/PLATELET
Abs Immature Granulocytes: 0 10*3/uL (ref 0.00–0.07)
Basophils Absolute: 0 10*3/uL (ref 0.0–0.1)
Basophils Relative: 0 %
Eosinophils Absolute: 0.1 10*3/uL (ref 0.0–0.5)
Eosinophils Relative: 1 %
HCT: 22.2 % — ABNORMAL LOW (ref 39.0–52.0)
Hemoglobin: 7.5 g/dL — ABNORMAL LOW (ref 13.0–17.0)
Lymphocytes Relative: 21 %
Lymphs Abs: 2.6 10*3/uL (ref 0.7–4.0)
MCH: 28.6 pg (ref 26.0–34.0)
MCHC: 33.8 g/dL (ref 30.0–36.0)
MCV: 84.7 fL (ref 80.0–100.0)
Monocytes Absolute: 4.1 10*3/uL — ABNORMAL HIGH (ref 0.1–1.0)
Monocytes Relative: 33 %
Neutro Abs: 5.3 10*3/uL (ref 1.7–7.7)
Neutrophils Relative %: 42 %
Other: 3 %
Platelets: 106 10*3/uL — ABNORMAL LOW (ref 150–400)
RBC: 2.62 MIL/uL — ABNORMAL LOW (ref 4.22–5.81)
RDW: 14.7 % (ref 11.5–15.5)
WBC: 12.5 10*3/uL — ABNORMAL HIGH (ref 4.0–10.5)
nRBC: 0.6 % — ABNORMAL HIGH (ref 0.0–0.2)

## 2020-08-28 LAB — SAMPLE TO BLOOD BANK

## 2020-08-28 NOTE — Progress Notes (Signed)
Error

## 2020-08-28 NOTE — Telephone Encounter (Signed)
Patient is to get 1 unit of blood in [place].

## 2020-08-29 ENCOUNTER — Inpatient Hospital Stay: Payer: Medicare Other

## 2020-08-29 DIAGNOSIS — D4621 Refractory anemia with excess of blasts 1: Secondary | ICD-10-CM | POA: Diagnosis not present

## 2020-08-29 DIAGNOSIS — D469 Myelodysplastic syndrome, unspecified: Secondary | ICD-10-CM

## 2020-08-29 DIAGNOSIS — D649 Anemia, unspecified: Secondary | ICD-10-CM

## 2020-08-29 MED ORDER — SODIUM CHLORIDE 0.9% IV SOLUTION
250.0000 mL | Freq: Once | INTRAVENOUS | Status: AC
  Administered 2020-08-29: 10:00:00 250 mL via INTRAVENOUS
  Filled 2020-08-29: qty 250

## 2020-08-29 MED ORDER — ACETAMINOPHEN 325 MG PO TABS: 650.0000 mg | ORAL_TABLET | Freq: Once | ORAL | Status: DC

## 2020-08-29 MED ORDER — DIPHENHYDRAMINE HCL 25 MG PO CAPS: 25.0000 mg | ORAL_CAPSULE | Freq: Once | ORAL | Status: DC

## 2020-08-29 NOTE — Patient Instructions (Signed)

## 2020-08-29 NOTE — Progress Notes (Signed)
Pt received blood transfusion today. Tolerated well. No complaints at time of discharge. Ambulatory. VSS.

## 2020-08-30 LAB — TYPE AND SCREEN
ABO/RH(D): B POS
Antibody Screen: NEGATIVE
Unit division: 0

## 2020-08-30 LAB — BPAM RBC
Blood Product Expiration Date: 202201142359
ISSUE DATE / TIME: 202112300956
Unit Type and Rh: 7300

## 2020-09-02 LAB — PREPARE RBC (CROSSMATCH)

## 2020-09-04 ENCOUNTER — Other Ambulatory Visit: Payer: Self-pay

## 2020-09-04 ENCOUNTER — Other Ambulatory Visit: Payer: Self-pay | Admitting: Hematology and Oncology

## 2020-09-04 ENCOUNTER — Telehealth: Payer: Self-pay | Admitting: *Deleted

## 2020-09-04 ENCOUNTER — Inpatient Hospital Stay: Payer: Medicare Other | Attending: Hematology and Oncology

## 2020-09-04 DIAGNOSIS — D4621 Refractory anemia with excess of blasts 1: Secondary | ICD-10-CM | POA: Insufficient documentation

## 2020-09-04 DIAGNOSIS — D469 Myelodysplastic syndrome, unspecified: Secondary | ICD-10-CM

## 2020-09-04 DIAGNOSIS — D649 Anemia, unspecified: Secondary | ICD-10-CM

## 2020-09-04 LAB — CBC WITH DIFFERENTIAL/PLATELET
Abs Immature Granulocytes: 0.4 10*3/uL — ABNORMAL HIGH (ref 0.00–0.07)
Band Neutrophils: 2 %
Basophils Absolute: 0.1 10*3/uL (ref 0.0–0.1)
Basophils Relative: 1 %
Eosinophils Absolute: 0.1 10*3/uL (ref 0.0–0.5)
Eosinophils Relative: 1 %
HCT: 22.8 % — ABNORMAL LOW (ref 39.0–52.0)
Hemoglobin: 7.6 g/dL — ABNORMAL LOW (ref 13.0–17.0)
Lymphocytes Relative: 17 %
Lymphs Abs: 2.4 10*3/uL (ref 0.7–4.0)
MCH: 28.4 pg (ref 26.0–34.0)
MCHC: 33.3 g/dL (ref 30.0–36.0)
MCV: 85.1 fL (ref 80.0–100.0)
Metamyelocytes Relative: 1 %
Monocytes Absolute: 4.3 10*3/uL — ABNORMAL HIGH (ref 0.1–1.0)
Monocytes Relative: 30 %
Myelocytes: 2 %
Neutro Abs: 6.8 10*3/uL (ref 1.7–7.7)
Neutrophils Relative %: 45 %
Other: 1 %
Platelets: 132 10*3/uL — ABNORMAL LOW (ref 150–400)
RBC Morphology: NONE SEEN
RBC: 2.68 MIL/uL — ABNORMAL LOW (ref 4.22–5.81)
RDW: 15 % (ref 11.5–15.5)
WBC: 14.4 10*3/uL — ABNORMAL HIGH (ref 4.0–10.5)
nRBC: 0.6 % — ABNORMAL HIGH (ref 0.0–0.2)

## 2020-09-04 LAB — SAMPLE TO BLOOD BANK

## 2020-09-04 LAB — PREPARE RBC (CROSSMATCH)

## 2020-09-04 NOTE — Telephone Encounter (Signed)
Spoke to patient's wife via telephone and gave her his lab results, HGB today is 7.6. She stated that he is weak and would like to get blood tomorrow. Patient on the schedule for 1 unit PRBC for tomorrow at 9:00. Wife verbalized understanding.

## 2020-09-05 ENCOUNTER — Inpatient Hospital Stay: Payer: Medicare Other

## 2020-09-05 DIAGNOSIS — D4621 Refractory anemia with excess of blasts 1: Secondary | ICD-10-CM | POA: Diagnosis not present

## 2020-09-05 DIAGNOSIS — D649 Anemia, unspecified: Secondary | ICD-10-CM

## 2020-09-05 DIAGNOSIS — D469 Myelodysplastic syndrome, unspecified: Secondary | ICD-10-CM

## 2020-09-05 MED ORDER — ACETAMINOPHEN 325 MG PO TABS: 650.0000 mg | ORAL_TABLET | Freq: Once | ORAL | Status: DC

## 2020-09-05 MED ORDER — DIPHENHYDRAMINE HCL 25 MG PO CAPS: 25.0000 mg | ORAL_CAPSULE | Freq: Once | ORAL | Status: DC

## 2020-09-05 MED ORDER — SODIUM CHLORIDE 0.9% IV SOLUTION
250.0000 mL | Freq: Once | INTRAVENOUS | Status: AC
  Administered 2020-09-05: 250 mL via INTRAVENOUS
  Filled 2020-09-05: qty 250

## 2020-09-05 NOTE — Progress Notes (Signed)
Pt tolerated one unit packed cells without difficulty, VSS, d/ced home

## 2020-09-06 LAB — TYPE AND SCREEN
ABO/RH(D): B POS
Antibody Screen: NEGATIVE
Unit division: 0

## 2020-09-06 LAB — BPAM RBC
Blood Product Expiration Date: 202201182359
ISSUE DATE / TIME: 202201060916
Unit Type and Rh: 7300

## 2020-09-11 ENCOUNTER — Telehealth: Payer: Self-pay | Admitting: *Deleted

## 2020-09-11 ENCOUNTER — Other Ambulatory Visit: Payer: Self-pay

## 2020-09-11 ENCOUNTER — Other Ambulatory Visit: Payer: Self-pay | Admitting: Hematology and Oncology

## 2020-09-11 ENCOUNTER — Inpatient Hospital Stay: Payer: Medicare Other

## 2020-09-11 DIAGNOSIS — D469 Myelodysplastic syndrome, unspecified: Secondary | ICD-10-CM

## 2020-09-11 DIAGNOSIS — D4621 Refractory anemia with excess of blasts 1: Secondary | ICD-10-CM | POA: Diagnosis not present

## 2020-09-11 DIAGNOSIS — D649 Anemia, unspecified: Secondary | ICD-10-CM

## 2020-09-11 LAB — CBC WITH DIFFERENTIAL/PLATELET
Abs Immature Granulocytes: 1.54 10*3/uL — ABNORMAL HIGH (ref 0.00–0.07)
Basophils Absolute: 0 10*3/uL (ref 0.0–0.1)
Basophils Relative: 0 %
Eosinophils Absolute: 0.2 10*3/uL (ref 0.0–0.5)
Eosinophils Relative: 1 %
HCT: 23.2 % — ABNORMAL LOW (ref 39.0–52.0)
Hemoglobin: 7.5 g/dL — ABNORMAL LOW (ref 13.0–17.0)
Immature Granulocytes: 10 %
Lymphocytes Relative: 19 %
Lymphs Abs: 3.1 10*3/uL (ref 0.7–4.0)
MCH: 27 pg (ref 26.0–34.0)
MCHC: 32.3 g/dL (ref 30.0–36.0)
MCV: 83.5 fL (ref 80.0–100.0)
Monocytes Absolute: 3.7 10*3/uL — ABNORMAL HIGH (ref 0.1–1.0)
Monocytes Relative: 23 %
Neutro Abs: 7.4 10*3/uL (ref 1.7–7.7)
Neutrophils Relative %: 47 %
Platelets: 134 10*3/uL — ABNORMAL LOW (ref 150–400)
RBC: 2.78 MIL/uL — ABNORMAL LOW (ref 4.22–5.81)
RDW: 16.3 % — ABNORMAL HIGH (ref 11.5–15.5)
Smear Review: NORMAL
WBC: 16 10*3/uL — ABNORMAL HIGH (ref 4.0–10.5)
nRBC: 0.6 % — ABNORMAL HIGH (ref 0.0–0.2)

## 2020-09-11 LAB — SAMPLE TO BLOOD BANK

## 2020-09-11 LAB — PREPARE RBC (CROSSMATCH)

## 2020-09-11 NOTE — Telephone Encounter (Signed)
Spoke to patient's wife via telephone. Gave her his HGB results from today =7.5. She stated that he wants a blood transfusion tomorrow. Confirmed appointment time with her.

## 2020-09-12 ENCOUNTER — Inpatient Hospital Stay: Payer: Medicare Other

## 2020-09-12 DIAGNOSIS — D649 Anemia, unspecified: Secondary | ICD-10-CM

## 2020-09-12 DIAGNOSIS — D469 Myelodysplastic syndrome, unspecified: Secondary | ICD-10-CM

## 2020-09-12 DIAGNOSIS — D4621 Refractory anemia with excess of blasts 1: Secondary | ICD-10-CM | POA: Diagnosis not present

## 2020-09-12 MED ORDER — ACETAMINOPHEN 325 MG PO TABS: 650.0000 mg | ORAL_TABLET | Freq: Once | ORAL | Status: DC

## 2020-09-12 MED ORDER — DIPHENHYDRAMINE HCL 25 MG PO CAPS: 25.0000 mg | ORAL_CAPSULE | Freq: Once | ORAL | Status: DC

## 2020-09-12 MED ORDER — SODIUM CHLORIDE 0.9% IV SOLUTION
250.0000 mL | Freq: Once | INTRAVENOUS | Status: AC
  Administered 2020-09-12: 250 mL via INTRAVENOUS
  Filled 2020-09-12: qty 250

## 2020-09-12 NOTE — Progress Notes (Signed)
Pt received one unit PRBCs without difficulty, VSS, pt d/ced home

## 2020-09-13 LAB — TYPE AND SCREEN
ABO/RH(D): B POS
Antibody Screen: NEGATIVE
Unit division: 0

## 2020-09-13 LAB — BPAM RBC
Blood Product Expiration Date: 202201232359
ISSUE DATE / TIME: 202201130907
Unit Type and Rh: 7300

## 2020-09-17 NOTE — Progress Notes (Signed)
[facility]  [address] [place], [place] [address] Phone: [phone]  Fax: [phone]   Clinic Day:  09/18/20   Referring physician: Lequita Asal, MD  Chief Complaint: Paul Pham. is a 87 y.o. male with RAEB-1 who is seen for 5 week assessment.  HPI: The patient was last seen in the hematology clinic on 08/05/2020. At that time, he felt "much stronger" since receiving 2 units of PRBCs. He denied chest pain, shortness of breath, and any other symptoms.  Exam was stable. Hematocrit was 24.0, hemoglobin 8.1, MCV 85.4, platelets 116,000, WBC 6,700. Sodium was 130. Glucose was 358. Calcium was 8.6 and albumin 3.9. Ferritin was 1,627.  Supportive care continued.  Labs followed: 08/14/2020: Hematocrit 20.7, hemoglobin 6.9, MCV 85.9, platelets 103,000, WBC   8,500. 08/21/2020: Hematocrit 24.7, hemoglobin 8.3, MCV 84.6, platelets 111,000, WBC   8,800. 08/28/2020: Hematocrit 22.2, hemoglobin 7.5, MCV 84.7, platelets 106,000, WBC 12,500. 09/04/220: Hematocrit 22.8, hemoglobin 7.6, MCV 85.1, platelets 132,000, WBC 14,400. 09/11/2020: Hematocrit 23.2, hemoglobin 7.5, MCV 83.5, platelets 134,000, WBC 16,000.  The patient received transfusions on 08/15/2020, 08/29/2020, 09/05/2020, and 09/12/2020.  During the interim, he has been "average." He has been able to do chores around the house but has not had enough energy to go on walks. He denies chest pain and shortness of breath. The patient thinks his transfusions are helpful and he feels that he needs one today.  He has not checked his blood sugar at home in a while. He eats a bowl of fruit every morning. He eats "no sugar added" cookies. He eats bread. He takes glimepiride 1 mg daily and metformin 1,000 mg BID.  He takes calcium 1,200 mg BID.   Past Medical History:  Diagnosis Date  . Anemia   . Arthritis   . BPH (benign prostatic hypertrophy)   . Cancer ([place])   . Complication of anesthesia     nausea  . Diabetes type 2, controlled ([place])   . HOH (hard of hearing)    r and L ears-70% loss per pt  . Hyperlipidemia   . Hypertension   . Myelodysplasia (myelodysplastic syndrome) ([place]) 2019    Past Surgical History:  Procedure Laterality Date  . APPENDECTOMY    . COLONOSCOPY  09/21/08   1 polyp found, tubular adenoma  . HAND SURGERY    . KNEE SURGERY Left     Family History  Problem Relation Age of Onset  . Aneurysm Mother   . Heart disease Father   . Cancer Brother        skin  . Heart disease Brother   . Heart disease Brother   . Cancer Sister        skin  . Aneurysm Brother   . Heart disease Brother   . Heart disease Sister   . Heart disease Sister   . COPD Sister   . Diabetes Sister     Social History:  reports that he has never smoked. He has never used smokeless tobacco. He reports that he does not drink alcohol and does not use drugs. 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 Patient denies known exposures to radiation on toxins. 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 He had his 23-year marriage anniversary on 08/23/2018.He is walking 1-35miles per day.He lives in [place].His great grandchild was recently born. The patient is accompanied by his wife today.  Allergies: No Known Allergies  Current Medications: Current Outpatient  Medications  Medication Sig Dispense Refill  . aspirin 81 MG chewable tablet Chew 81 mg by mouth daily.    . Calcium Carbonate-Vit D-Min (CALCIUM 1200 PO) Take 1,200 mg by mouth in the morning and at bedtime.    . Cranberry (THERACRAN PO) Take 1 tablet by mouth daily.     . cyanocobalamin 1000 MCG tablet Take 1,000 mcg by mouth daily.    . finasteride (PROSCAR) 5 MG tablet Take 5 mg by mouth daily.    Marland Kitchen glucose blood test strip FreeStyle Lite Strips    . LANCETS ULTRA FINE MISC Lancets,Ultra Thin 26 gauge    . lisinopril (PRINIVIL,ZESTRIL) 10 MG tablet Take 10 mg by mouth daily.    Marland Kitchen loperamide (IMODIUM) 2  MG capsule Take 1 capsule (2 mg total) by mouth See admin instructions. With onset of loose stool, take 39m followed by 215mevery 2 hours until loose bowel movement stopped. Maximum: 16 mg/day (Patient taking differently: Take 2 mg by mouth as needed. With onset of loose stool, take 30m81mollowed by 2mg9mery 2 hours until loose bowel movement stopped. Maximum: 16 mg/day) 30 capsule 0  . meclizine (ANTIVERT) 25 MG tablet Take 25 mg by mouth 2 (two) times daily as needed.     . metformin (FORTAMET) 1000 MG (OSM) 24 hr tablet Take 1,000 mg by mouth 2 (two) times daily with a meal.    . Multiple Vitamins-Minerals (CENTRUM SILVER PO) Take by mouth.    . mupirocin ointment (BACTROBAN) 2 % Apply 1 application topically daily. With dressing changes 22 g 0  . Omega-3 Fatty Acids (FISH OIL PO) Take 1 tablet by mouth daily.     . Polyethylene Glycol 3350 (MIRALAX PO) Take by mouth as needed.     . SaClarnce Flockmetto-Phytosterols (PROSTATE SR PO) Take 1 tablet by mouth daily.     . simvastatin (ZOCOR) 40 MG tablet Take 40 mg by mouth daily.    . glMarland Kitchenmepiride (AMARYL) 1 MG tablet Take 1 tablet by mouth daily after breakfast.    . ondansetron (ZOFRAN) 4 MG tablet Take 1 tablet (4 mg total) by mouth every 6 (six) hours as needed for nausea or vomiting. (Patient not taking: Reported on 09/18/2020) 30 tablet 2   No current facility-administered medications for this visit.   Facility-Administered Medications Ordered in Other Visits  Medication Dose Route Frequency Provider Last Rate Last Admin  . acetaminophen (TYLENOL) tablet 650 mg  650 mg Oral Once CorcLequita Asal      . acetaminophen (TYLENOL) tablet 650 mg  650 mg Oral Once CorcLequita Asal      . acetaminophen (TYLENOL) tablet 650 mg  650 mg Oral Once CorcLequita Asal      . acetaminophen (TYLENOL) tablet 650 mg  650 mg Oral Once CorcLequita Asal      . diphenhydrAMINE (BENADRYL) capsule 25 mg  25 mg Oral Once CorcNolon StallsMD       . diphenhydrAMINE (BENADRYL) capsule 25 mg  25 mg Oral Once CorcNolon StallsMD      . diphenhydrAMINE (BENADRYL) capsule 25 mg  25 mg Oral Once CorcNolon StallsMD      . diphenhydrAMINE (BENADRYL) capsule 25 mg  25 mg Oral Once CorcLequita Asal        Review of Systems  Constitutional: Positive for weight loss (1 lb). Negative for chills, diaphoresis, fever and malaise/fatigue.  Feels "average."  HENT: Positive for hearing loss. Negative for congestion, ear discharge, ear pain, nosebleeds, sinus pain, sore throat and tinnitus.   Eyes: Negative.  Negative for blurred vision.  Respiratory: Negative.  Negative for cough, hemoptysis, sputum production and shortness of breath.   Cardiovascular: Negative.  Negative for chest pain, palpitations and leg swelling.  Gastrointestinal: Negative.  Negative for abdominal pain, blood in stool, constipation, diarrhea, heartburn, melena, nausea and vomiting.  Genitourinary: Negative.  Negative for dysuria, frequency, hematuria and urgency.  Musculoskeletal: Negative.  Negative for back pain, joint pain, myalgias and neck pain.  Skin: Negative for itching and rash.  Neurological: Negative.  Negative for dizziness, tingling, sensory change, weakness and headaches.  Endo/Heme/Allergies: Does not bruise/bleed easily.       Diabetes.  Psychiatric/Behavioral: Negative.  Negative for depression and memory loss. The patient is not nervous/anxious and does not have insomnia.   All other systems reviewed and are negative.  Performance status (ECOG): 1  Vitals Blood pressure (!) 126/46, pulse 80, temperature (!) 97.4 F (36.3 C), temperature source Tympanic, resp. rate 16, weight 179 lb 10.8 oz (81.5 kg), SpO2 100 %.   Physical Exam Vitals and nursing note reviewed.  Constitutional:      General: He is not in acute distress.    Appearance: He is well-developed. He is not diaphoretic.  HENT:     Head: Normocephalic and atraumatic.      Comments: Male pattern baldness.    Mouth/Throat:     Mouth: Mucous membranes are moist.     Pharynx: Oropharynx is clear. No oropharyngeal exudate.  Eyes:     General: No scleral icterus.    Conjunctiva/sclera: Conjunctivae normal.     Pupils: Pupils are equal, round, and reactive to light.     Comments: Gold rimmed glasses.  Blue eyes.   Cardiovascular:     Rate and Rhythm: Normal rate and regular rhythm.     Heart sounds: Normal heart sounds. No murmur heard.   Pulmonary:     Effort: Pulmonary effort is normal. No respiratory distress.     Breath sounds: Normal breath sounds. No wheezing or rales.  Chest:     Chest wall: No tenderness.  Breasts:     Right: No axillary adenopathy or supraclavicular adenopathy.     Left: No axillary adenopathy or supraclavicular adenopathy.    Abdominal:     General: Bowel sounds are normal. There is no distension.     Palpations: Abdomen is soft. There is no hepatomegaly, splenomegaly or mass.     Tenderness: There is no abdominal tenderness. There is no guarding or rebound.  Musculoskeletal:        General: No swelling or tenderness. Normal range of motion.     Cervical back: Normal range of motion and neck supple.  Lymphadenopathy:     Head:     Right side of head: No preauricular, posterior auricular or occipital adenopathy.     Left side of head: No preauricular, posterior auricular or occipital adenopathy.     Cervical: No cervical adenopathy.     Upper Body:     Right upper body: No supraclavicular or axillary adenopathy.     Left upper body: No supraclavicular or axillary adenopathy.     Lower Body: No right inguinal adenopathy. No left inguinal adenopathy.  Skin:    General: Skin is warm and dry.  Neurological:     Mental Status: He is alert and oriented to person, place, and  time.  Psychiatric:        Behavior: Behavior normal.        Thought Content: Thought content normal.        Judgment: Judgment normal.    Appointment  on 09/18/2020  Component Date Value Ref Range Status  . WBC 09/18/2020 22.5* 4.0 - 10.5 K/uL Final  . RBC 09/18/2020 2.86* 4.22 - 5.81 MIL/uL Final  . Hemoglobin 09/18/2020 7.8* 13.0 - 17.0 g/dL Final  . HCT 09/18/2020 24.2* 39.0 - 52.0 % Final  . MCV 09/18/2020 84.6  80.0 - 100.0 fL Final  . MCH 09/18/2020 27.3  26.0 - 34.0 pg Final  . MCHC 09/18/2020 32.2  30.0 - 36.0 g/dL Final  . RDW 09/18/2020 16.1* 11.5 - 15.5 % Final  . Platelets 09/18/2020 125* 150 - 400 K/uL Final   Comment: Immature Platelet Fraction may be clinically indicated, consider ordering this additional test [id number]   . nRBC 09/18/2020 0.5* 0.0 - 0.2 % Final   Performed at [facility], [address] [place], [place] [address]  . Neutrophils Relative % 09/18/2020 PENDING  % Incomplete  . Neutro Abs 09/18/2020 PENDING  1.7 - 7.7 K/uL Incomplete  . Band Neutrophils 09/18/2020 PENDING  % Incomplete  . Lymphocytes Relative 09/18/2020 PENDING  % Incomplete  . Lymphs Abs 09/18/2020 PENDING  0.7 - 4.0 K/uL Incomplete  . Monocytes Relative 09/18/2020 PENDING  % Incomplete  . Monocytes Absolute 09/18/2020 PENDING  0.1 - 1.0 K/uL Incomplete  . Eosinophils Relative 09/18/2020 PENDING  % Incomplete  . Eosinophils Absolute 09/18/2020 PENDING  0.0 - 0.5 K/uL Incomplete  . Basophils Relative 09/18/2020 PENDING  % Incomplete  . Basophils Absolute 09/18/2020 PENDING  0.0 - 0.1 K/uL Incomplete  . WBC Morphology 09/18/2020 PENDING   Incomplete  . RBC Morphology 09/18/2020 PENDING   Incomplete  . Smear Review 09/18/2020 PENDING   Incomplete  . Other 09/18/2020 PENDING  % Incomplete  . nRBC 09/18/2020 PENDING  0 /100 WBC Incomplete  . Metamyelocytes Relative 09/18/2020 PENDING  % Incomplete  . Myelocytes 09/18/2020 PENDING  % Incomplete  . Promyelocytes Relative 09/18/2020 PENDING  % Incomplete  . Blasts 09/18/2020 PENDING  % Incomplete  . Immature Granulocytes 09/18/2020 PENDING  % Incomplete  . Abs Immature  Granulocytes 09/18/2020 PENDING  0.00 - 0.07 K/uL Incomplete  . Sodium 09/18/2020 134* 135 - 145 mmol/L Final  . Potassium 09/18/2020 3.8  3.5 - 5.1 mmol/L Final  . Chloride 09/18/2020 98  98 - 111 mmol/L Final  . CO2 09/18/2020 24  22 - 32 mmol/L Final  . Glucose, Bld 09/18/2020 371* 70 - 99 mg/dL Final   Glucose reference range applies only to samples taken after fasting for at least 8 hours.  . BUN 09/18/2020 22  8 - 23 mg/dL Final  . Creatinine, Ser 09/18/2020 1.17  0.61 - 1.24 mg/dL Final  . Calcium 09/18/2020 8.7* 8.9 - 10.3 mg/dL Final  . Total Protein 09/18/2020 7.3  6.5 - 8.1 g/dL Final  . Albumin 09/18/2020 4.0  3.5 - 5.0 g/dL Final  . AST 09/18/2020 32  15 - 41 U/L Final  . ALT 09/18/2020 37  0 - 44 U/L Final  . Alkaline Phosphatase 09/18/2020 41  38 - 126 U/L Final  . Total Bilirubin 09/18/2020 0.4  0.3 - 1.2 mg/dL Final  . GFR, Estimated 09/18/2020 58* >60 mL/min Final   Comment: (NOTE) Calculated using the CKD-EPI Creatinine Equation (2021)   . Anion gap 09/18/2020  12  5 - 15 Final   Performed at [facility], [address] [place], [place] [address]    Assessment:  Paul Pham. is a 87 y.o. 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 Bone marrowon 09/22/2017 revealed a hypercellular for age with dyspoietic changes variably involving myeloid cell lines, but with main involvement of the granulocytic/monocytic cell line. This was associated with bone marrow monocytosis and borderline number to slight increase in blastic cells. The overall changes favor a primary myeloid neoplasm, particularly a myelodysplastic syndrome especially refractory anemia with excess blasts (RAEB-1) or possibly refractory cytopenia with multilineage dysplasia. Consideration was also given to an evolving myelodysplastic/myeloproliferative neoplasm such as chronic myelomonocytic leukemia but the lack of absolute peripheral monocytosis precluded such a diagnosis at this time. Flow cytometrywas negative.  Cytogeneticswere normal (46, XY). Foundation one was positive for ASXL1 A263FH*54, EZH2 Splice site 562-5W>L, RUNX1 [id number]. IPSS-R4.5, intermediate group.  Anemia work-up on 08/17/2017: Vitamin B12 (658), folate (22.0), and TSH (2.688). Retic was 2.6%. Haptoglobin was 116 on 08/19/2017. Epo levelwas 104.8 on 10/05/2017.  Ferritin has been followed: 106 on 08/17/2017, 131 on 11/04/2017, 144 on 05/30/2018, 394 on 09/01/2018, 441 on 11/28/2018, 394 on 01/02/2019, 480 on 02/06/2019, 392 on 03/13/2019, 527 on 04/17/2019, 533 on 05/29/2019, 476 on 06/05/2019, 517 on 07/17/2019, 685 on 08/28/2019, 724 on 10/16/2019, 749 on 01/08/2020, 95 on 02/08/2020, 811 on 03/07/2020, 1189 on 04/17/2020, 1203 on 05/22/2020, 1483 on 06/19/2020, 1288 on 07/03/2020, and 1627 on 08/05/2020. Iron saturation was 34% on 08/17/2017 and 89% on 09/01/2018.  Bone marrowon 09/22/2017 revealed a hypercellular for age with dyspoietic changes variably involving myeloid cell lines, but with main involvement of the granulocytic/monocytic cell line. This was associated with bone marrow monocytosis and borderline number to slight increase in blasts (5%). The overall changes favor a primary myeloid neoplasm, particularly a myelodysplastic syndrome especially refractory anemia with excess blasts (RAEB-1) or possibly refractory cytopenia with multilineage dysplasia. Consideration was also given to an evolving myelodysplastic/myeloproliferative neoplasm such as chronic myelomonocytic leukemia but the lack of absolute peripheral monocytosis precluded such a diagnosis at this time. Flow cytometrywas negative. Cytogeneticswere normal (46, XY). Foundation One was positive for ASXL1 S937DS*28, EZH2 Splice site 768-1L>X, RUNX1 [id number].  He has received20cycles ofVidaza(11/04/2017 - 08/22/2018; 10/24/2018-10/23/2019). He has received Aranesp(initially 150 mcg every 2 weeks post chemotherapy then 300 mcg every 1-2 weeks after cycle  #3; last 09/19/2018). He last received Aranesp on02/15/2021. He has received 1-2units of PRBCswith each cycle.He has received55 units of PRBCs since 08/17/2017 (last 09/12/2020).  Bone marrowon 08/12/2018 revealed a hypercellular marrow with dyspoietic changes. There was a borderline number of blasts (5%) seen by morphology. The findings were similar to previous biopsy although the cellularity is slightly higher in the current material.consistent with previously known myelodysplastic syndrome. Flow cytometry was negative. Cytogenetics were normal (46, XY).  Bone marrowon 11/27/2019 revealed a hypercellular bone marrow (60-80%) with myelodysplastic syndrome. The morphologic features were similar to the previous biopsy. There was no definite increase in blasts. Flow cytometry revealed no significant CD34-positive blastic population, increase in monocytic cells (up to 18%), Tcells with nonspecific phenotypic changes, and no monoclonal B-cell population identified.Cytogenetics were normal (46, XY). FISH was negative. Next-Gen myeloid disorder profilerevealsASXL1G657f*15,EZH2splice site 1726-2M>B,TDHRCBsite cU.384-5XMI splice site [id number].[id number]_[id number],UGQB1Q945W*38 There were no abnormalities in FLT3, IDH1, IDH2, NPM1.  [facility] revealed a hypercellular bone marrow (80%) with persistent morphologic dyspoiesis and 9% blasts/promonocytes by manual differential.  [place] and 09/27/2019.  Symptomatically, he has felt "average." He has been able to do chores around the house but has not had enough energy to go on walks. He denies chest pain and shortness of breath. He feels better after transfusion.  Exam is stable.  He remains transfusion dependent.  Platelet count is drifting down.  WBC is increasing slowly.  Plan: 1.   Labs today: CBC with diff, CMP, hold tube. 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  He denies any B symptoms.  Exam reveals no adenopathy or hepatosplenomegaly.  Helast received Vidazaon 10/23/2019. Treatmentgoalis todecrease transfusion dependence and prevent acceleration of disease (blasts). [facility] marrow biopsy at [facility] on 02/15/2020 was hypercellular (80%) with persistent morphologic dyspoiesis and 9% blasts/promonocytes by manual differential.  He remains on supportive care (transfusion support alone).   He received 4 units of PRBCs since his last visit.  Hematocrit 24.2.  Hemoglobin 7.8.  MCV 84.6.  Platelets 125,000.  WBC 22,500 today.   Peripheral smear for pathologic review.  He denies any bleeding.  We discussed likely progression of disease.  Continue transfusion support.    Transfuse 1 unit PRBCs tomorrow. 3. Iron overload Ferritinwas 1627 on 08/05/2020. He has received 55 units of PRBCs to date (08/17/2017 -09/12/2020). Previously we discussed Jadenu potential side effects of myelosuppression renal insufficiency.  He is not interested in initiation of Jadenu secondary to potential side effects. 4.   Transfuse 1 unit PRBCs tomorrow. 5.   RTC weekly x 4 for labs (CBC, hold tube) and +/- PRBC transfusion the next day. 6.   RTC in 5 weeks for MD assessment, labs (CBC with diff, CMP, hold tube) and +/- PRBCs transfusion the following day.  Addendum:  Peripheral smear revealed absent leukocytosis with neutrophilia, monocytosis and left shifted maturation.  There is no increase in blast estimated at < 1% by manual differential.  Few leukocytes show nuclear hyper lobation and reduce cytoplasmic granularity.  There is a normocytic anemia with scattered teardrop cells.  There is thrombocytopenia with unremarkable platelet morphology.  Findings are compatible with a myelodysplastic/myeloproliferative disorder.  There is no increase in  blasts to suggest progressive disease.  I discussed the assessment and treatment plan with the patient.  The patient was provided an opportunity to ask questions and all were answered.  The patient agreed with the plan and demonstrated an understanding of the instructions.  The patient was advised to call back if the symptoms worsen or if the condition fails to improve as anticipated.   Lequita Asal, MD, PhD    09/18/2020, 11:12 AM  I, Mirian Mo Tufford, am acting as a Education administrator for [employer]. Mike Gip, MD.   I, Jaggar Benko C. Mike Gip, MD, have reviewed the above documentation for accuracy and completeness, and I agree with the above.

## 2020-09-18 ENCOUNTER — Inpatient Hospital Stay: Payer: Medicare Other

## 2020-09-18 ENCOUNTER — Ambulatory Visit: Payer: TRICARE For Life (TFL)

## 2020-09-18 ENCOUNTER — Inpatient Hospital Stay (HOSPITAL_BASED_OUTPATIENT_CLINIC_OR_DEPARTMENT_OTHER): Payer: Medicare Other | Admitting: Hematology and Oncology

## 2020-09-18 ENCOUNTER — Other Ambulatory Visit: Payer: Self-pay | Admitting: Hematology and Oncology

## 2020-09-18 ENCOUNTER — Other Ambulatory Visit: Payer: Self-pay

## 2020-09-18 ENCOUNTER — Telehealth: Payer: Self-pay | Admitting: *Deleted

## 2020-09-18 ENCOUNTER — Encounter: Payer: Self-pay | Admitting: Hematology and Oncology

## 2020-09-18 VITALS — BP 126/46 | HR 80 | Temp 97.4°F | Resp 16 | Wt 179.7 lb

## 2020-09-18 DIAGNOSIS — D469 Myelodysplastic syndrome, unspecified: Secondary | ICD-10-CM

## 2020-09-18 DIAGNOSIS — D4621 Refractory anemia with excess of blasts 1: Secondary | ICD-10-CM | POA: Diagnosis not present

## 2020-09-18 DIAGNOSIS — Z7189 Other specified counseling: Secondary | ICD-10-CM | POA: Diagnosis not present

## 2020-09-18 DIAGNOSIS — D649 Anemia, unspecified: Secondary | ICD-10-CM

## 2020-09-18 LAB — CBC WITH DIFFERENTIAL/PLATELET
Abs Immature Granulocytes: 0.7 10*3/uL — ABNORMAL HIGH (ref 0.00–0.07)
Band Neutrophils: 7 %
Basophils Absolute: 0.2 10*3/uL — ABNORMAL HIGH (ref 0.0–0.1)
Basophils Relative: 1 %
Eosinophils Absolute: 0 10*3/uL (ref 0.0–0.5)
Eosinophils Relative: 0 %
HCT: 24.2 % — ABNORMAL LOW (ref 39.0–52.0)
Hemoglobin: 7.8 g/dL — ABNORMAL LOW (ref 13.0–17.0)
Lymphocytes Relative: 18 %
Lymphs Abs: 4.1 10*3/uL — ABNORMAL HIGH (ref 0.7–4.0)
MCH: 27.3 pg (ref 26.0–34.0)
MCHC: 32.2 g/dL (ref 30.0–36.0)
MCV: 84.6 fL (ref 80.0–100.0)
Monocytes Absolute: 5 10*3/uL — ABNORMAL HIGH (ref 0.1–1.0)
Monocytes Relative: 22 %
Myelocytes: 3 %
Neutro Abs: 12.6 10*3/uL — ABNORMAL HIGH (ref 1.7–7.7)
Neutrophils Relative %: 49 %
Platelets: 125 10*3/uL — ABNORMAL LOW (ref 150–400)
RBC Morphology: NONE SEEN
RBC: 2.86 MIL/uL — ABNORMAL LOW (ref 4.22–5.81)
RDW: 16.1 % — ABNORMAL HIGH (ref 11.5–15.5)
WBC: 22.5 10*3/uL — ABNORMAL HIGH (ref 4.0–10.5)
nRBC: 0.5 % — ABNORMAL HIGH (ref 0.0–0.2)
nRBC: 2 /100 WBC — ABNORMAL HIGH

## 2020-09-18 LAB — COMPREHENSIVE METABOLIC PANEL
ALT: 37 U/L (ref 0–44)
AST: 32 U/L (ref 15–41)
Albumin: 4 g/dL (ref 3.5–5.0)
Alkaline Phosphatase: 41 U/L (ref 38–126)
Anion gap: 12 (ref 5–15)
BUN: 22 mg/dL (ref 8–23)
CO2: 24 mmol/L (ref 22–32)
Calcium: 8.7 mg/dL — ABNORMAL LOW (ref 8.9–10.3)
Chloride: 98 mmol/L (ref 98–111)
Creatinine, Ser: 1.17 mg/dL (ref 0.61–1.24)
GFR, Estimated: 58 mL/min — ABNORMAL LOW (ref >60)
Glucose, Bld: 371 mg/dL — ABNORMAL HIGH (ref 70–99)
Potassium: 3.8 mmol/L (ref 3.5–5.1)
Sodium: 134 mmol/L — ABNORMAL LOW (ref 135–145)
Total Bilirubin: 0.4 mg/dL (ref 0.3–1.2)
Total Protein: 7.3 g/dL (ref 6.5–8.1)

## 2020-09-18 LAB — PATHOLOGIST SMEAR REVIEW

## 2020-09-18 LAB — PREPARE RBC (CROSSMATCH)

## 2020-09-18 LAB — SAMPLE TO BLOOD BANK

## 2020-09-18 NOTE — Telephone Encounter (Signed)
Spoke to patient's wife via telephone; gave her his lab results from today. HGB 7.8. Orders in for blood transfusion tomorrow at 9:00. Blood bank notified via email.

## 2020-09-19 ENCOUNTER — Inpatient Hospital Stay: Payer: Medicare Other

## 2020-09-19 DIAGNOSIS — D649 Anemia, unspecified: Secondary | ICD-10-CM

## 2020-09-19 DIAGNOSIS — D4621 Refractory anemia with excess of blasts 1: Secondary | ICD-10-CM | POA: Diagnosis not present

## 2020-09-19 DIAGNOSIS — D469 Myelodysplastic syndrome, unspecified: Secondary | ICD-10-CM

## 2020-09-19 MED ORDER — SODIUM CHLORIDE 0.9% IV SOLUTION
250.0000 mL | Freq: Once | INTRAVENOUS | Status: AC
  Administered 2020-09-19: 250 mL via INTRAVENOUS
  Filled 2020-09-19: qty 250

## 2020-09-19 NOTE — Progress Notes (Signed)
Patient received prescribed treatment in clinic. 1 unit PRBC. Tolerated well. Patient stable at discharge. 

## 2020-09-20 LAB — BPAM RBC
Blood Product Expiration Date: 202202012359
ISSUE DATE / TIME: 202201200910
Unit Type and Rh: 1700

## 2020-09-20 LAB — TYPE AND SCREEN
ABO/RH(D): B POS
Antibody Screen: NEGATIVE
Unit division: 0

## 2020-09-23 ENCOUNTER — Other Ambulatory Visit: Payer: Self-pay

## 2020-09-24 ENCOUNTER — Other Ambulatory Visit: Payer: Self-pay

## 2020-09-24 ENCOUNTER — Other Ambulatory Visit: Payer: Self-pay | Admitting: Hematology and Oncology

## 2020-09-24 ENCOUNTER — Inpatient Hospital Stay: Payer: Medicare Other

## 2020-09-24 DIAGNOSIS — D469 Myelodysplastic syndrome, unspecified: Secondary | ICD-10-CM

## 2020-09-24 DIAGNOSIS — D649 Anemia, unspecified: Secondary | ICD-10-CM

## 2020-09-24 DIAGNOSIS — D4621 Refractory anemia with excess of blasts 1: Secondary | ICD-10-CM | POA: Diagnosis not present

## 2020-09-24 LAB — CBC WITH DIFFERENTIAL/PLATELET
Abs Immature Granulocytes: 2.38 10*3/uL — ABNORMAL HIGH (ref 0.00–0.07)
Basophils Absolute: 0.1 10*3/uL (ref 0.0–0.1)
Basophils Relative: 0 %
Eosinophils Absolute: 0.2 10*3/uL (ref 0.0–0.5)
Eosinophils Relative: 1 %
HCT: 26 % — ABNORMAL LOW (ref 39.0–52.0)
Hemoglobin: 8.4 g/dL — ABNORMAL LOW (ref 13.0–17.0)
Immature Granulocytes: 10 %
Lymphocytes Relative: 15 %
Lymphs Abs: 3.7 10*3/uL (ref 0.7–4.0)
MCH: 27.2 pg (ref 26.0–34.0)
MCHC: 32.3 g/dL (ref 30.0–36.0)
MCV: 84.1 fL (ref 80.0–100.0)
Monocytes Absolute: 6.6 10*3/uL — ABNORMAL HIGH (ref 0.1–1.0)
Monocytes Relative: 27 %
Neutro Abs: 12.1 10*3/uL — ABNORMAL HIGH (ref 1.7–7.7)
Neutrophils Relative %: 47 %
Platelets: 117 10*3/uL — ABNORMAL LOW (ref 150–400)
RBC: 3.09 MIL/uL — ABNORMAL LOW (ref 4.22–5.81)
RDW: 15.9 % — ABNORMAL HIGH (ref 11.5–15.5)
Smear Review: NORMAL
WBC: 25.1 10*3/uL — ABNORMAL HIGH (ref 4.0–10.5)
nRBC: 0.5 % — ABNORMAL HIGH (ref 0.0–0.2)

## 2020-09-24 LAB — SAMPLE TO BLOOD BANK

## 2020-09-24 LAB — PREPARE RBC (CROSSMATCH)

## 2020-09-25 ENCOUNTER — Inpatient Hospital Stay: Payer: Medicare Other

## 2020-09-25 DIAGNOSIS — D469 Myelodysplastic syndrome, unspecified: Secondary | ICD-10-CM

## 2020-09-25 DIAGNOSIS — D4621 Refractory anemia with excess of blasts 1: Secondary | ICD-10-CM | POA: Diagnosis not present

## 2020-09-25 DIAGNOSIS — D649 Anemia, unspecified: Secondary | ICD-10-CM

## 2020-09-25 MED ORDER — SODIUM CHLORIDE 0.9% IV SOLUTION
250.0000 mL | Freq: Once | INTRAVENOUS | Status: DC
  Filled 2020-09-25: qty 250

## 2020-09-25 MED ORDER — DIPHENHYDRAMINE HCL 25 MG PO CAPS: 25.0000 mg | ORAL_CAPSULE | Freq: Once | ORAL | Status: DC

## 2020-09-25 MED ORDER — ACETAMINOPHEN 325 MG PO TABS: 650.0000 mg | ORAL_TABLET | Freq: Once | ORAL | Status: DC

## 2020-09-25 NOTE — Progress Notes (Signed)
Pt received prescribed treatment in clinic, pt stable at d/c. 

## 2020-09-26 LAB — TYPE AND SCREEN
ABO/RH(D): B POS
Antibody Screen: NEGATIVE
Unit division: 0

## 2020-09-26 LAB — BPAM RBC
Blood Product Expiration Date: 202202192359
ISSUE DATE / TIME: 202201260918
Unit Type and Rh: 7300

## 2020-10-01 ENCOUNTER — Other Ambulatory Visit: Payer: Self-pay

## 2020-10-01 ENCOUNTER — Telehealth: Payer: Self-pay

## 2020-10-01 ENCOUNTER — Inpatient Hospital Stay: Payer: Medicare Other | Attending: Hematology and Oncology

## 2020-10-01 DIAGNOSIS — D4621 Refractory anemia with excess of blasts 1: Secondary | ICD-10-CM | POA: Diagnosis not present

## 2020-10-01 DIAGNOSIS — D469 Myelodysplastic syndrome, unspecified: Secondary | ICD-10-CM

## 2020-10-01 LAB — CBC WITH DIFFERENTIAL/PLATELET
Abs Immature Granulocytes: 3.13 10*3/uL — ABNORMAL HIGH (ref 0.00–0.07)
Basophils Absolute: 0.1 10*3/uL (ref 0.0–0.1)
Basophils Relative: 0 %
Eosinophils Absolute: 0.3 10*3/uL (ref 0.0–0.5)
Eosinophils Relative: 1 %
HCT: 27 % — ABNORMAL LOW (ref 39.0–52.0)
Hemoglobin: 8.8 g/dL — ABNORMAL LOW (ref 13.0–17.0)
Immature Granulocytes: 11 %
Lymphocytes Relative: 15 %
Lymphs Abs: 4.5 10*3/uL — ABNORMAL HIGH (ref 0.7–4.0)
MCH: 27.8 pg (ref 26.0–34.0)
MCHC: 32.6 g/dL (ref 30.0–36.0)
MCV: 85.4 fL (ref 80.0–100.0)
Monocytes Absolute: 7.8 10*3/uL — ABNORMAL HIGH (ref 0.1–1.0)
Monocytes Relative: 27 %
Neutro Abs: 13.7 10*3/uL — ABNORMAL HIGH (ref 1.7–7.7)
Neutrophils Relative %: 46 %
Platelets: 110 10*3/uL — ABNORMAL LOW (ref 150–400)
RBC: 3.16 MIL/uL — ABNORMAL LOW (ref 4.22–5.81)
RDW: 15.6 % — ABNORMAL HIGH (ref 11.5–15.5)
Smear Review: NORMAL
WBC: 29.5 10*3/uL — ABNORMAL HIGH (ref 4.0–10.5)
nRBC: 0.3 % — ABNORMAL HIGH (ref 0.0–0.2)

## 2020-10-01 LAB — SAMPLE TO BLOOD BANK

## 2020-10-01 NOTE — Telephone Encounter (Signed)
Patient wife called and wanted to know patient hemoglobin and if he will nee  transfusion. I stated to the patient wife that His hemoglobin today is 8.8 and he don't need a transfusion.

## 2020-10-02 ENCOUNTER — Inpatient Hospital Stay: Payer: Medicare Other

## 2020-10-08 ENCOUNTER — Other Ambulatory Visit: Payer: Self-pay | Admitting: Hematology and Oncology

## 2020-10-08 ENCOUNTER — Inpatient Hospital Stay: Payer: Medicare Other

## 2020-10-08 ENCOUNTER — Other Ambulatory Visit: Payer: Self-pay

## 2020-10-08 DIAGNOSIS — D469 Myelodysplastic syndrome, unspecified: Secondary | ICD-10-CM

## 2020-10-08 DIAGNOSIS — D4621 Refractory anemia with excess of blasts 1: Secondary | ICD-10-CM | POA: Diagnosis not present

## 2020-10-08 LAB — CBC WITH DIFFERENTIAL/PLATELET
Abs Immature Granulocytes: 6.43 10*3/uL — ABNORMAL HIGH (ref 0.00–0.07)
Basophils Absolute: 0.1 10*3/uL (ref 0.0–0.1)
Basophils Relative: 0 %
Eosinophils Absolute: 0.4 10*3/uL (ref 0.0–0.5)
Eosinophils Relative: 1 %
HCT: 25.9 % — ABNORMAL LOW (ref 39.0–52.0)
Hemoglobin: 8.3 g/dL — ABNORMAL LOW (ref 13.0–17.0)
Immature Granulocytes: 14 %
Lymphocytes Relative: 14 %
Lymphs Abs: 6.4 10*3/uL — ABNORMAL HIGH (ref 0.7–4.0)
MCH: 27.4 pg (ref 26.0–34.0)
MCHC: 32 g/dL (ref 30.0–36.0)
MCV: 85.5 fL (ref 80.0–100.0)
Monocytes Absolute: 10 10*3/uL — ABNORMAL HIGH (ref 0.1–1.0)
Monocytes Relative: 22 %
Neutro Abs: 22.4 10*3/uL — ABNORMAL HIGH (ref 1.7–7.7)
Neutrophils Relative %: 49 %
Platelets: 98 10*3/uL — ABNORMAL LOW (ref 150–400)
RBC: 3.03 MIL/uL — ABNORMAL LOW (ref 4.22–5.81)
RDW: 15.9 % — ABNORMAL HIGH (ref 11.5–15.5)
WBC: 45.7 10*3/uL — ABNORMAL HIGH (ref 4.0–10.5)
nRBC: 0.4 % — ABNORMAL HIGH (ref 0.0–0.2)

## 2020-10-08 LAB — SAMPLE TO BLOOD BANK

## 2020-10-09 ENCOUNTER — Inpatient Hospital Stay: Payer: Medicare Other

## 2020-10-09 ENCOUNTER — Telehealth: Payer: Self-pay | Admitting: *Deleted

## 2020-10-09 LAB — PATHOLOGIST SMEAR REVIEW

## 2020-10-09 NOTE — Telephone Encounter (Signed)
Left voice mail message that patient's HGB is 8.3 and that he does not need a transfusion at this time.

## 2020-10-15 ENCOUNTER — Inpatient Hospital Stay: Payer: Medicare Other

## 2020-10-15 ENCOUNTER — Other Ambulatory Visit: Payer: Self-pay | Admitting: Hematology and Oncology

## 2020-10-15 ENCOUNTER — Other Ambulatory Visit: Payer: Self-pay

## 2020-10-15 ENCOUNTER — Telehealth: Payer: Self-pay

## 2020-10-15 DIAGNOSIS — D649 Anemia, unspecified: Secondary | ICD-10-CM

## 2020-10-15 DIAGNOSIS — D4621 Refractory anemia with excess of blasts 1: Secondary | ICD-10-CM | POA: Diagnosis not present

## 2020-10-15 DIAGNOSIS — D469 Myelodysplastic syndrome, unspecified: Secondary | ICD-10-CM

## 2020-10-15 LAB — CBC WITH DIFFERENTIAL/PLATELET
Abs Immature Granulocytes: 6.93 10*3/uL — ABNORMAL HIGH (ref 0.00–0.07)
Basophils Absolute: 0.1 10*3/uL (ref 0.0–0.1)
Basophils Relative: 0 %
Eosinophils Absolute: 0.4 10*3/uL (ref 0.0–0.5)
Eosinophils Relative: 1 %
HCT: 23 % — ABNORMAL LOW (ref 39.0–52.0)
Hemoglobin: 7.3 g/dL — ABNORMAL LOW (ref 13.0–17.0)
Immature Granulocytes: 13 %
Lymphocytes Relative: 12 %
Lymphs Abs: 6.2 10*3/uL — ABNORMAL HIGH (ref 0.7–4.0)
MCH: 27.1 pg (ref 26.0–34.0)
MCHC: 31.7 g/dL (ref 30.0–36.0)
MCV: 85.5 fL (ref 80.0–100.0)
Monocytes Absolute: 13.5 10*3/uL — ABNORMAL HIGH (ref 0.1–1.0)
Monocytes Relative: 26 %
Neutro Abs: 25.2 10*3/uL — ABNORMAL HIGH (ref 1.7–7.7)
Neutrophils Relative %: 48 %
Platelets: 128 10*3/uL — ABNORMAL LOW (ref 150–400)
RBC: 2.69 MIL/uL — ABNORMAL LOW (ref 4.22–5.81)
RDW: 16.3 % — ABNORMAL HIGH (ref 11.5–15.5)
Smear Review: NORMAL
WBC: 52.3 10*3/uL (ref 4.0–10.5)
nRBC: 0.5 % — ABNORMAL HIGH (ref 0.0–0.2)

## 2020-10-15 LAB — SAMPLE TO BLOOD BANK

## 2020-10-15 LAB — PREPARE RBC (CROSSMATCH)

## 2020-10-15 NOTE — Progress Notes (Signed)
[facility]  [address], [address] [place], [place] [address] Phone: [phone]  Fax: [phone]   Clinic Day:  10/16/20   Referring physician: Lequita Asal, MD  Chief Complaint: Paul Pham. is a 87 y.o. 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  HPI: The patient was last seen in the hematology clinic on 09/18/2020. At that time, he had felt "average." He had been able to do chores around the house but had not had enough energy to go on walks. He denied chest pain and shortness of breath. He felt better after transfusion.  Exam was stable.  He remained transfusion dependent.  Platelet count was drifting down.  WBC was increasing slowly. Hematocrit was 24.2, hemoglobin 7.8, MCV 84.6, platelets 125,000, and WBC 22,500. Sodium was 134. Glucose was 371. Calcium was 8.7. CrCl was 58 ml/min.  Peripheral smear on 09/18/2020 revealed absolute leukocytosis, with neutrophilia, monocytosis, and left shifted maturation. There was no increase in blasts, estimated at less than 1% by manual differential. There were few leukocytes show nuclear hypolobation and reduced cytoplasmic granularity. There was normocytic anemia, with scattered teardrop cells. There were thrombocytopenia, with unremarkable platelet morphology. 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 There was no increase in blasts to suggest progression of disease.   Labs followed: 09/24/2020: Hematocrit 26.0, hemoglobin 8.4, MCV 84.1, platelets 117,000, WBC 25,100. 10/01/2020: Hematocrit 27.0, hemoglobin 8.8, MCV 85.4, platelets 110,000, WBC 29,500. 10/08/2020: Hematocrit 25.9, hemoglobin 8.3, MCV 85.5, platelets   98,000, WBC 45,700. 10/15/2020: Hematocrit 23.0, hemoglobin 7.3, MCV 85.5, platelets 128,000, WBC 52,300.  Peripheral smear on 10/08/2020 revealed absolute leukocytosis, with neutrophilia, monocytosis, and left shifted maturation. Few granulocytes  showed nuclear hypolobation and reduced cytoplasmic granularity. There were rare circulating blasts, estimated at less than 1% of total. There was normocytic anemia with mild anisocytosis, polychromasia, and few nucleated RBCs. There was thrombocytopenia, with few giant platelets. The patient's history of MDS was noted, and the current peripheral blood findings were compatible with MDS or MDS/MPN. Although there was no significant increase in circulating blasts, the significance of the continually increasing WBC count was unclear, and could potentially represent disease progression.   He received 1 unit PRBC on 09/19/2020 and 09/25/2020.  During the interim, he has been "sore." He gets tired easily so he is not able to be as active at home. He can tell that he is getting weaker despite getting transfusions. He does feel better for a few days after his transfusions. The patient denies fevers and sweats. His appetite is normal.   Past Medical History:  Diagnosis Date  . Anemia   . Arthritis   . BPH (benign prostatic hypertrophy)   . Cancer ([place])   . Complication of anesthesia    nausea  . Diabetes type 2, controlled ([place])   . HOH (hard of hearing)    r and L ears-70% loss per pt  . Hyperlipidemia   . Hypertension   . Myelodysplasia (myelodysplastic syndrome) ([place]) 2019    Past Surgical History:  Procedure Laterality Date  . APPENDECTOMY    . COLONOSCOPY  09/21/08   1 polyp found, tubular adenoma  . HAND SURGERY    . KNEE SURGERY Left     Family History  Problem Relation Age of Onset  . Aneurysm Mother   . Heart disease Father   . Cancer Brother        skin  . Heart disease Brother   . Heart disease Brother   .  Cancer Sister        skin  . Aneurysm Brother   . Heart disease Brother   . Heart disease Sister   . Heart disease Sister   . COPD Sister   . Diabetes Sister     Social History:  reports that he has never smoked. He has never used smokeless tobacco. He reports  that he does not drink alcohol and does not use drugs. 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 Patient denies known exposures to radiation on toxins. 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 He had his 15-year marriage anniversary on 08/23/2018.He is walking 1-53miles per day.He lives in [place].His great grandchild was recently born. The patient is accompanied by his wife today.  Allergies: No Known Allergies  Current Medications: Current Outpatient Medications  Medication Sig Dispense Refill  . aspirin 81 MG chewable tablet Chew 81 mg by mouth daily.    . Calcium Carbonate-Vit D-Min (CALCIUM 1200 PO) Take 1,200 mg by mouth in the morning and at bedtime.    . Cranberry (THERACRAN PO) Take 1 tablet by mouth daily.     . cyanocobalamin 1000 MCG tablet Take 1,000 mcg by mouth daily.    . finasteride (PROSCAR) 5 MG tablet Take 5 mg by mouth daily.    Marland Kitchen glucose blood test strip FreeStyle Lite Strips    . LANCETS ULTRA FINE MISC Lancets,Ultra Thin 26 gauge    . lisinopril (PRINIVIL,ZESTRIL) 10 MG tablet Take 10 mg by mouth daily.    . meclizine (ANTIVERT) 25 MG tablet Take 25 mg by mouth 2 (two) times daily as needed.     . metformin (FORTAMET) 1000 MG (OSM) 24 hr tablet Take 1,000 mg by mouth 2 (two) times daily with a meal.    . Multiple Vitamins-Minerals (CENTRUM SILVER PO) Take by mouth.    . Omega-3 Fatty Acids (FISH OIL PO) Take 1 tablet by mouth daily.     . Polyethylene Glycol 3350 (MIRALAX PO) Take by mouth as needed.     [name] (PROSTATE SR PO) Take 1 tablet by mouth daily.     . simvastatin (ZOCOR) 40 MG tablet Take 40 mg by mouth daily.    Marland Kitchen glimepiride (AMARYL) 1 MG tablet Take 1 tablet by mouth daily after breakfast.    . loperamide (IMODIUM) 2 MG capsule Take 1 capsule (2 mg total) by mouth See admin instructions. With onset of loose stool, take $RemoveBef'4mg'rzXhlxzBPq$  followed by $RemoveBef'2mg'ApIytDXXzE$  every 2 hours until loose bowel movement stopped. Maximum: 16 mg/day (Patient  not taking: Reported on 10/16/2020) 30 capsule 0  . mupirocin ointment (BACTROBAN) 2 % Apply 1 application topically daily. With dressing changes (Patient not taking: Reported on 10/16/2020) 22 g 0  . ondansetron (ZOFRAN) 4 MG tablet Take 1 tablet (4 mg total) by mouth every 6 (six) hours as needed for nausea or vomiting. (Patient not taking: No sig reported) 30 tablet 2   No current facility-administered medications for this visit.   Facility-Administered Medications Ordered in Other Visits  Medication Dose Route Frequency Provider Last Rate Last Admin  . acetaminophen (TYLENOL) tablet 650 mg  650 mg Oral Once Nolon Stalls C, MD      . acetaminophen (TYLENOL) tablet 650 mg  650 mg Oral Once Nolon Stalls C, MD      . acetaminophen (TYLENOL) tablet 650 mg  650 mg Oral Once Lequita Asal, MD      . acetaminophen (TYLENOL) tablet  650 mg  650 mg Oral Once Nolon Stalls C, MD      . diphenhydrAMINE (BENADRYL) capsule 25 mg  25 mg Oral Once Nolon Stalls C, MD      . diphenhydrAMINE (BENADRYL) capsule 25 mg  25 mg Oral Once Nolon Stalls C, MD      . diphenhydrAMINE (BENADRYL) capsule 25 mg  25 mg Oral Once Nolon Stalls C, MD      . diphenhydrAMINE (BENADRYL) capsule 25 mg  25 mg Oral Once Lequita Asal, MD        Review of Systems  Constitutional: Positive for malaise/fatigue and weight loss (3 lbs). Negative for chills, diaphoresis and fever.       Feels "sore."  HENT: Positive for hearing loss. Negative for congestion, ear discharge, ear pain, nosebleeds, sinus pain, sore throat and tinnitus.   Eyes: Negative.  Negative for blurred vision.  Respiratory: Negative.  Negative for cough, hemoptysis, sputum production and shortness of breath.   Cardiovascular: Negative.  Negative for chest pain, palpitations and leg swelling.  Gastrointestinal: Negative.  Negative for abdominal pain, blood in stool, constipation, diarrhea, heartburn, melena, nausea and vomiting.        Normal appetite.  Genitourinary: Negative.  Negative for dysuria, frequency, hematuria and urgency.  Musculoskeletal: Negative.  Negative for back pain, joint pain, myalgias and neck pain.  Skin: Negative for itching and rash.  Neurological: Negative.  Negative for dizziness, tingling, sensory change, weakness and headaches.  Endo/Heme/Allergies: Does not bruise/bleed easily.       Diabetes.  Psychiatric/Behavioral: Negative.  Negative for depression and memory loss. The patient is not nervous/anxious and does not have insomnia.   All other systems reviewed and are negative.  Performance status (ECOG): 1  Vitals Blood pressure (!) 148/51, pulse 86, temperature 97.9 F (36.6 C), temperature source Tympanic, resp. rate 16, weight 176 lb 9.4 oz (80.1 kg), SpO2 99 %.   Physical Exam Vitals and nursing note reviewed.  Constitutional:      General: He is not in acute distress.    Appearance: He is well-developed. He is not diaphoretic.  HENT:     Head: Normocephalic and atraumatic.     Comments: Male pattern baldness.    Mouth/Throat:     Mouth: Mucous membranes are moist.     Pharynx: Oropharynx is clear. No oropharyngeal exudate.     Comments: Bite mark on inside of right cheek. Eyes:     General: No scleral icterus.    Conjunctiva/sclera: Conjunctivae normal.     Pupils: Pupils are equal, round, and reactive to light.     Comments: Gold rimmed glasses.  Blue eyes.   Cardiovascular:     Rate and Rhythm: Normal rate and regular rhythm.     Heart sounds: Normal heart sounds. No murmur heard.   Pulmonary:     Effort: Pulmonary effort is normal. No respiratory distress.     Breath sounds: Normal breath sounds. No wheezing or rales.  Chest:     Chest wall: No tenderness.  Breasts:     Right: No axillary adenopathy or supraclavicular adenopathy.     Left: No axillary adenopathy or supraclavicular adenopathy.    Abdominal:     General: Bowel sounds are normal. There is no  distension.     Palpations: Abdomen is soft. There is no hepatomegaly, splenomegaly or mass.     Tenderness: There is no abdominal tenderness. There is no guarding or rebound.  Musculoskeletal:  General: No swelling or tenderness. Normal range of motion.     Cervical back: Normal range of motion and neck supple.  Lymphadenopathy:     Head:     Right side of head: No preauricular, posterior auricular or occipital adenopathy.     Left side of head: No preauricular, posterior auricular or occipital adenopathy.     Cervical: No cervical adenopathy.     Upper Body:     Right upper body: No supraclavicular or axillary adenopathy.     Left upper body: No supraclavicular or axillary adenopathy.     Lower Body: No right inguinal adenopathy. No left inguinal adenopathy.  Skin:    General: Skin is warm and dry.  Neurological:     Mental Status: He is alert and oriented to person, place, and time.  Psychiatric:        Behavior: Behavior normal.        Thought Content: Thought content normal.        Judgment: Judgment normal.    Orders Only on 10/15/2020  Component Date Value Ref Range Status  . Order Confirmation 10/15/2020    Final                   Value:ORDER PROCESSED BY BLOOD BANK Performed at [facility], [address] [place], [place] [address]   . ABO/RH(D) 10/15/2020 B POS   Final  . Antibody Screen 10/15/2020 NEG   Final  . Sample Expiration 10/15/2020 10/18/2020,2359   Final  . Unit Number 10/15/2020    Final                   SLPNP:[id number] Performed at [facility], [address] [place], [place] [address]   . Blood Component Type 10/15/2020    Final                   Value:RBC, LR IRR Performed at [facility], [address] [place], [place] [address]   . Unit division 10/15/2020    Final                   Value:00 Performed at [facility], [address] [place], [place] [address]   . Status of Unit  10/15/2020    Final                   Value:ALLOCATED Performed at [facility], [address] [place], [place] [address]   . Transfusion Status 10/15/2020 OK TO TRANSFUSE   Final  . Crossmatch Result 10/15/2020    Final                   Value:Compatible Performed at [facility], [address] [place], [place] [address]   . Blood Product Unit Number 10/15/2020 [id number]   Final  . PRODUCT CODE 10/15/2020 [id number]   Final  . Unit Type and Rh 10/15/2020 7300   Final  . Blood Product Expiration Date 10/15/2020 [id number]   Final  Appointment on 10/15/2020  Component Date Value Ref Range Status  . Blood Bank Specimen 10/15/2020 SAMPLE AVAILABLE FOR TESTING   Final  . Sample Expiration 10/15/2020    Final                   Value:10/18/2020,2359 Performed at [facility], [address] [place], [place] [address]   . WBC 10/15/2020 52.3* 4.0 - 10.5 K/uL Final  This critical result has verified and been called to [facility] by Woodfin Ganja on 02 15 2022 at 1000, and has been read back.   Marland Kitchen RBC 10/15/2020 2.69* 4.22 - 5.81 MIL/uL Final  . Hemoglobin 10/15/2020 7.3* 13.0 - 17.0 g/dL Final  . HCT 10/15/2020 23.0* 39.0 - 52.0 % Final  . MCV 10/15/2020 85.5  80.0 - 100.0 fL Final  . MCH 10/15/2020 27.1  26.0 - 34.0 pg Final  . MCHC 10/15/2020 31.7  30.0 - 36.0 g/dL Final  . RDW 10/15/2020 16.3* 11.5 - 15.5 % Final  . Platelets 10/15/2020 128* 150 - 400 K/uL Final   Comment: Immature Platelet Fraction may be clinically indicated, consider ordering this additional test [id number]   . nRBC 10/15/2020 0.5* 0.0 - 0.2 % Final  . Neutrophils Relative % 10/15/2020 48  % Final  . Neutro Abs 10/15/2020 25.2* 1.7 - 7.7 K/uL Final  . Lymphocytes Relative 10/15/2020 12  % Final  . Lymphs Abs 10/15/2020 6.2* 0.7 - 4.0 K/uL Final  . Monocytes Relative 10/15/2020 26  % Final  . Monocytes Absolute 10/15/2020 13.5* 0.1 - 1.0 K/uL Final  . Eosinophils  Relative 10/15/2020 1  % Final  . Eosinophils Absolute 10/15/2020 0.4  0.0 - 0.5 K/uL Final  . Basophils Relative 10/15/2020 0  % Final  . Basophils Absolute 10/15/2020 0.1  0.0 - 0.1 K/uL Final  . WBC Morphology 10/15/2020 MILD LEFT SHIFT (1-5% METAS, OCC MYELO, OCC BANDS)   Final  . Smear Review 10/15/2020 Normal platelet morphology   Final  . Immature Granulocytes 10/15/2020 13  % Final  . Abs Immature Granulocytes 10/15/2020 6.93* 0.00 - 0.07 K/uL Final  . Polychromasia 10/15/2020 PRESENT   Final  . Ovalocytes 10/15/2020 PRESENT   Final   Performed at [facility], [address], [place], [place] [address]    Assessment:  Paul Pham. is a 87 y.o. 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 Bone marrowon 09/22/2017 revealed a hypercellular for age with dyspoietic changes variably involving myeloid cell lines, but with main involvement of the granulocytic/monocytic cell line. This was associated with bone marrow monocytosis and borderline number to slight increase in blastic cells. The overall changes favor a primary myeloid neoplasm, particularly a myelodysplastic syndrome especially refractory anemia with excess blasts (RAEB-1) or possibly refractory cytopenia with multilineage dysplasia. Consideration was also given to an evolving myelodysplastic/myeloproliferative neoplasm such as chronic myelomonocytic leukemia but the lack of absolute peripheral monocytosis precluded such a diagnosis at this time. Flow cytometrywas negative. Cytogeneticswere normal (46, XY). Foundation one was positive for ASXL1 Q676PP*50, EZH2 Splice site 932-6Z>T, RUNX1 [id number]. IPSS-R4.5, intermediate group.  Anemia work-up on 08/17/2017: Vitamin B12 (658), folate (22.0), and TSH (2.688). Retic was 2.6%. Haptoglobin was 116 on 08/19/2017. Epo levelwas 104.8 on 10/05/2017.  Ferritin has been followed: 106 on 08/17/2017, 131 on 11/04/2017, 144 on 05/30/2018, 394 on 09/01/2018, 441 on 11/28/2018, 394 on 01/02/2019,  480 on 02/06/2019, 392 on 03/13/2019, 527 on 04/17/2019, 533 on 05/29/2019, 476 on 06/05/2019, 517 on 07/17/2019, 685 on 08/28/2019, 724 on 10/16/2019, 749 on 01/08/2020, 95 on 02/08/2020, 811 on 03/07/2020, 1189 on 04/17/2020, 1203 on 05/22/2020, 1483 on 06/19/2020, 1288 on 07/03/2020, and 1627 on 08/05/2020. Iron saturation was 34% on 08/17/2017 and 89% on 09/01/2018.  Bone marrowon 09/22/2017 revealed a hypercellular for age with dyspoietic changes variably involving myeloid cell lines, but with main involvement of the granulocytic/monocytic cell line. This was associated with bone marrow monocytosis and borderline number to slight  increase in blasts (5%). The overall changes favor a primary myeloid neoplasm, particularly a myelodysplastic syndrome especially refractory anemia with excess blasts (RAEB-1) or possibly refractory cytopenia with multilineage dysplasia. Consideration was also given to an evolving myelodysplastic/myeloproliferative neoplasm such as chronic myelomonocytic leukemia but the lack of absolute peripheral monocytosis precluded such a diagnosis at this time. Flow cytometrywas negative. Cytogeneticswere normal (46, XY). Foundation One was positive for ASXL1 W098JX*91, EZH2 Splice site 478-2N>F, RUNX1 [id number].  He has received20cycles ofVidaza(11/04/2017 - 08/22/2018; 10/24/2018-10/23/2019). He has received Aranesp(initially 150 mcg every 2 weeks post chemotherapy then 300 mcg every 1-2 weeks after cycle #3; last 09/19/2018). He last received Aranesp on02/15/2021. He has received 1-2units of PRBCswith each cycle.He has received57 units of PRBCs since 08/17/2017 (last 09/25/2020).  Bone marrowon 08/12/2018 revealed a hypercellular marrow with dyspoietic changes. There was a borderline number of blasts (5%) seen by morphology. The findings were similar to previous biopsy although the cellularity is slightly higher in the current material.consistent with previously  known myelodysplastic syndrome. Flow cytometry was negative. Cytogenetics were normal (46, XY).  Bone marrowon 11/27/2019 revealed a hypercellular bone marrow (60-80%) with myelodysplastic syndrome. The morphologic features were similar to the previous biopsy. There was no definite increase in blasts. Flow cytometry revealed no significant CD34-positive blastic population, increase in monocytic cells (up to 18%), Tcells with nonspecific phenotypic changes, and no monoclonal B-cell population identified.Cytogenetics were normal (46, XY). FISH was negative. Next-Gen myeloid disorder profilerevealsASXL1G649f*15,EZH2splice site 1621-3Y>Q,MVHQIOsite cN.629-5MWU splice site cX.[id number]_010-2VOZDGUYQI,HKVQ2V956L*87 There were no abnormalities in FLT3, IDH1, IDH2, NPM1.  [facility] revealed a hypercellular bone marrow (80%) with persistent morphologic dyspoiesis and 9% blasts/promonocytes by manual differential.  [place]ceived the [place]19 vaccine[place]01[place]2020 and 09/27/2019.  Symptomatically, he has been getting weaker despite getting transfusions. Exam is stable.  WBC is 52,300  Plan: 1.   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  Exam feels no adenopathy or hepatosplenomegaly.  Helast received Vidazaon 10/23/2019. Treatmentgoalis todecrease transfusion dependence and prevent acceleration of disease (blasts). [facility] was hypercellular (80%) with persistent morphologic dyspoiesis and 9% blasts/promonocytes by manual differential.  He remains on supportive care (transfusion support alone).   He received 2 units of PRBCs since his last visit.  Hematocrit 23.0.  Hemoglobin 7.3.  MCV 85.5.  Platelets 128,000.  WBC 52,300 today.   Peripheral smear reveals no significant increase in  blasts.  He denies any bleeding.  We reviewed his trend in counts over the past several weeks.   WBC is increasing in platelet count falling.   He likely has progressive disease.  Discuss patient's thoughts about treatment.    Discuss prior conversation about more aggressive treatment well as clinical trials.   Patient had previously declined.  Contact Dr. VGarnette Scheuermannat [facility]. 3. Iron overload Ferritinwas 15643on 08/05/2020. He has received 57 units of PRBCs to date (08/17/2017 -09/25/2020). Previously we discussed Jadenu potential side effects of myelosuppression renal insufficiency.  He is not interested in initiation of Jadenu secondary to potential side effects. 4.   Transfuse 1 unit PRBCs today. 5.   Add to next labs draw (BMP, uric acid, flow cytometry). 6.   RTC as previously scheduled.  Addendum: The patient and his wife subsequently contacted the clinic after his appointment.  He has decided to continue supportive care (transfusion support alone) without plan for more aggressive treatment  I  discussed the assessment and treatment plan with the patient.  The patient was provided an opportunity to ask questions and all were answered.  The patient agreed with the plan and demonstrated an understanding of the instructions.  The patient was advised to call back if the symptoms worsen or if the condition fails to improve as anticipated.   Lequita Asal, MD, PhD    10/16/2020 , 9:09 AM  I, Mirian Mo Tufford, am acting as a Education administrator for [employer]. Mike Gip, MD.   I, Tytianna Greenley C. Mike Gip, MD, have reviewed the above documentation for accuracy and completeness, and I agree with the above.

## 2020-10-15 NOTE — Telephone Encounter (Signed)
-----   Message from Lequita Asal, MD sent at 10/15/2020 12:41 PM EST ----- Regarding: Please call patient  Ask if he would like a transfusion tomorrow.  Please also set up a short visit with me.  M  ----- Message ----- From: Buel Ream, Lab In [place] Sent: 10/15/2020  10:01 AM EST To: Lequita Asal, MD

## 2020-10-15 NOTE — Telephone Encounter (Signed)
Patient would like transfusion. He will be here at 845 to see Dr C and transfusion at 900.

## 2020-10-16 ENCOUNTER — Encounter: Payer: Self-pay | Admitting: Hematology and Oncology

## 2020-10-16 ENCOUNTER — Other Ambulatory Visit: Payer: Self-pay

## 2020-10-16 ENCOUNTER — Inpatient Hospital Stay: Payer: Medicare Other

## 2020-10-16 ENCOUNTER — Inpatient Hospital Stay (HOSPITAL_BASED_OUTPATIENT_CLINIC_OR_DEPARTMENT_OTHER): Payer: Medicare Other | Admitting: Hematology and Oncology

## 2020-10-16 VITALS — BP 148/51 | HR 86 | Temp 97.9°F | Resp 16 | Wt 176.6 lb

## 2020-10-16 DIAGNOSIS — D4621 Refractory anemia with excess of blasts 1: Secondary | ICD-10-CM | POA: Diagnosis not present

## 2020-10-16 DIAGNOSIS — D649 Anemia, unspecified: Secondary | ICD-10-CM

## 2020-10-16 DIAGNOSIS — D469 Myelodysplastic syndrome, unspecified: Secondary | ICD-10-CM

## 2020-10-16 DIAGNOSIS — D72829 Elevated white blood cell count, unspecified: Secondary | ICD-10-CM | POA: Insufficient documentation

## 2020-10-16 MED ORDER — ACETAMINOPHEN 325 MG PO TABS: 650.0000 mg | ORAL_TABLET | Freq: Once | ORAL | Status: DC

## 2020-10-16 MED ORDER — DIPHENHYDRAMINE HCL 25 MG PO CAPS: 25.0000 mg | ORAL_CAPSULE | Freq: Once | ORAL | Status: DC

## 2020-10-16 MED ORDER — SODIUM CHLORIDE 0.9% IV SOLUTION
250.0000 mL | Freq: Once | INTRAVENOUS | Status: AC
  Administered 2020-10-16: 250 mL via INTRAVENOUS
  Filled 2020-10-16: qty 250

## 2020-10-16 NOTE — Progress Notes (Signed)
Pt received one unit of PRBC without difficulty, VSS, d/ced home 

## 2020-10-17 LAB — TYPE AND SCREEN
ABO/RH(D): B POS
Antibody Screen: NEGATIVE
Unit division: 0

## 2020-10-17 LAB — BPAM RBC
Blood Product Expiration Date: 202203152359
ISSUE DATE / TIME: 202202160928
Unit Type and Rh: 7300

## 2020-10-22 NOTE — Progress Notes (Signed)
[facility]  [address] [place], [place] [address] Phone: [phone]  Fax: [phone]   Clinic Day:  10/23/20   Referring physician: Lequita Asal, MD  Chief Complaint: Paul Flinn. is a 87 y.o. 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  HPI: The patient was last seen in the hematology clinic on 10/16/2020. At that time, he described feeling tired easily.  Activity level had declined.  He denied any fever or sweats. Hematocrit was 23.0, hemoglobin 7.3, MCV 85.5, platelets 128,000, WBC 52,300. We discussed his rising WBC and likely progression of disease. He received 1 unit PRBCs.  During the interim, he has been fatigued. He gets tired very easily with minimal exertion. He feels that he is eating well and does not know why he is losing weight. He felt better after receiving a transfusion next week.  The patient would like to go back to [facility] to discuss potential treatment options.   Past Medical History:  Diagnosis Date  . Anemia   . Arthritis   . BPH (benign prostatic hypertrophy)   . Cancer ([place])   . Complication of anesthesia    nausea  . Diabetes type 2, controlled ([place])   . HOH (hard of hearing)    r and L ears-70% loss per pt  . Hyperlipidemia   . Hypertension   . Myelodysplasia (myelodysplastic syndrome) ([place]) 2019    Past Surgical History:  Procedure Laterality Date  . APPENDECTOMY    . COLONOSCOPY  09/21/08   1 polyp found, tubular adenoma  . HAND SURGERY    . KNEE SURGERY Left     Family History  Problem Relation Age of Onset  . Aneurysm Mother   . Heart disease Father   . Cancer Brother        skin  . Heart disease Brother   . Heart disease Brother   . Cancer Sister        skin  . Aneurysm Brother   . Heart disease Brother   . Heart disease Sister   . Heart disease Sister   . COPD Sister   . Diabetes Sister     Social History:  reports that he has never smoked. He has never  used smokeless tobacco. He reports that he does not drink alcohol and does not use drugs. 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 Patient denies known exposures to radiation on toxins. 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 He had his 48-year marriage anniversary on 08/23/2018.He is walking 1-68mles per day.[place]. The patient is accompanied by his wife today.  Allergies: No Known Allergies  Current Medications: Current Outpatient Medications  Medication Sig Dispense Refill  . aspirin 81 MG chewable tablet Chew 81 mg by mouth daily.    . Calcium Carbonate-Vit D-Min (CALCIUM 1200 PO) Take 1,200 mg by mouth in the morning and at bedtime.    . Cranberry (THERACRAN PO) Take 1 tablet by mouth daily.     . cyanocobalamin 1000 MCG tablet Take 1,000 mcg by mouth daily.    . finasteride (PROSCAR) 5 MG tablet Take 5 mg by mouth daily.    .Marland Kitchenglucose blood test strip FreeStyle Lite Strips    . LANCETS ULTRA FINE MISC Lancets,Ultra Thin 26 gauge    . lisinopril (PRINIVIL,ZESTRIL) 10 MG tablet Take 10 mg by mouth daily.    .Marland Kitchenloperamide (IMODIUM)  2 MG capsule Take 1 capsule (2 mg total) by mouth See admin instructions. With onset of loose stool, take 37m followed by 247mevery 2 hours until loose bowel movement stopped. Maximum: 16 mg/day 30 capsule 0  . meclizine (ANTIVERT) 25 MG tablet Take 25 mg by mouth 2 (two) times daily as needed.     . metformin (FORTAMET) 1000 MG (OSM) 24 hr tablet Take 1,000 mg by mouth 2 (two) times daily with a meal.    . Multiple Vitamins-Minerals (CENTRUM SILVER PO) Take by mouth.    . Omega-3 Fatty Acids (FISH OIL PO) Take 1 tablet by mouth daily.     . Polyethylene Glycol 3350 (MIRALAX PO) Take by mouth as needed.     . Clarnce Flockalmetto-Phytosterols (PROSTATE SR PO) Take 1 tablet by mouth daily.     . simvastatin (ZOCOR) 40 MG tablet Take 40 mg by mouth daily.    . Marland Kitchenlimepiride (AMARYL) 1 MG tablet Take 1  tablet by mouth daily after breakfast.    . mupirocin ointment (BACTROBAN) 2 % Apply 1 application topically daily. With dressing changes (Patient not taking: No sig reported) 22 g 0  . ondansetron (ZOFRAN) 4 MG tablet Take 1 tablet (4 mg total) by mouth every 6 (six) hours as needed for nausea or vomiting. (Patient not taking: No sig reported) 30 tablet 2   No current facility-administered medications for this visit.   Facility-Administered Medications Ordered in Other Visits  Medication Dose Route Frequency Provider Last Rate Last Admin  . acetaminophen (TYLENOL) tablet 650 mg  650 mg Oral Once CoLequita AsalMD      . acetaminophen (TYLENOL) tablet 650 mg  650 mg Oral Once CoLequita AsalMD      . acetaminophen (TYLENOL) tablet 650 mg  650 mg Oral Once CoLequita AsalMD      . acetaminophen (TYLENOL) tablet 650 mg  650 mg Oral Once CoLequita AsalMD      . diphenhydrAMINE (BENADRYL) capsule 25 mg  25 mg Oral Once CoNolon Stalls, MD      . diphenhydrAMINE (BENADRYL) capsule 25 mg  25 mg Oral Once CoNolon Stalls, MD      . diphenhydrAMINE (BENADRYL) capsule 25 mg  25 mg Oral Once CoNolon Stalls, MD      . diphenhydrAMINE (BENADRYL) capsule 25 mg  25 mg Oral Once CoLequita AsalMD        Review of Systems  Constitutional: Positive for malaise/fatigue and weight loss (3 lbs). Negative for chills, diaphoresis and fever.  HENT: Positive for hearing loss. Negative for congestion, ear discharge, ear pain, nosebleeds, sinus pain, sore throat and tinnitus.   Eyes: Negative.  Negative for blurred vision.  Respiratory: Negative.  Negative for cough, hemoptysis, sputum production and shortness of breath.   Cardiovascular: Negative.  Negative for chest pain, palpitations and leg swelling.  Gastrointestinal: Negative.  Negative for abdominal pain, blood in stool, constipation, diarrhea, heartburn, melena, nausea and vomiting.       Eating well   Genitourinary: Negative.  Negative for dysuria, frequency, hematuria and urgency.  Musculoskeletal: Negative.  Negative for back pain, joint pain, myalgias and neck pain.  Skin: Negative for itching and rash.  Neurological: Negative.  Negative for dizziness, tingling, sensory change, weakness and headaches.  Endo/Heme/Allergies: Does not bruise/bleed easily.       Diabetes.  Psychiatric/Behavioral: Negative.  Negative for depression and memory loss. The patient is not nervous/anxious and  does not have insomnia.   All other systems reviewed and are negative.  Performance status (ECOG): 1  Vitals Blood pressure (!) 123/50, pulse 80, temperature (!) 96.7 F (35.9 C), temperature source Tympanic, weight 173 lb 11.6 oz (78.8 kg), SpO2 100 %.   Physical Exam Vitals and nursing note reviewed.  Constitutional:      General: He is not in acute distress.    Appearance: He is well-developed. He is not diaphoretic.  HENT:     Head: Normocephalic and atraumatic.     Comments: Male pattern baldness.    Mouth/Throat:     Mouth: Mucous membranes are moist.     Pharynx: Oropharynx is clear. No oropharyngeal exudate.  Eyes:     General: No scleral icterus.    Extraocular Movements: Extraocular movements intact.     Conjunctiva/sclera: Conjunctivae normal.     Pupils: Pupils are equal, round, and reactive to light.     Comments: Gold rimmed glasses.  Blue eyes.   Cardiovascular:     Rate and Rhythm: Normal rate and regular rhythm.     Heart sounds: Normal heart sounds. No murmur heard.   Pulmonary:     Effort: Pulmonary effort is normal. No respiratory distress.     Breath sounds: Normal breath sounds. No wheezing or rales.  Chest:     Chest wall: No tenderness.  Breasts:     Right: No axillary adenopathy or supraclavicular adenopathy.     Left: No axillary adenopathy or supraclavicular adenopathy.    Abdominal:     General: Bowel sounds are normal. There is no distension.      Palpations: Abdomen is soft. There is no hepatomegaly, splenomegaly or mass.     Tenderness: There is no abdominal tenderness. There is no guarding or rebound.  Musculoskeletal:        General: No swelling or tenderness. Normal range of motion.     Cervical back: Normal range of motion and neck supple.  Lymphadenopathy:     Head:     Right side of head: No preauricular, posterior auricular or occipital adenopathy.     Left side of head: No preauricular, posterior auricular or occipital adenopathy.     Cervical: No cervical adenopathy.     Upper Body:     Right upper body: No supraclavicular or axillary adenopathy.     Left upper body: No supraclavicular or axillary adenopathy.     Lower Body: No right inguinal adenopathy. No left inguinal adenopathy.  Skin:    General: Skin is warm and dry.  Neurological:     Mental Status: He is alert and oriented to person, place, and time.  Psychiatric:        Behavior: Behavior normal.        Thought Content: Thought content normal.        Judgment: Judgment normal.    Appointment on 10/23/2020  Component Date Value Ref Range Status  . Uric Acid, Serum 10/23/2020 7.9  3.7 - 8.6 mg/dL Final   Performed at [facility], [address], [place], [place] [address]  . Sodium 10/23/2020 133* 135 - 145 mmol/L Final  . Potassium 10/23/2020 4.5  3.5 - 5.1 mmol/L Final  . Chloride 10/23/2020 100  98 - 111 mmol/L Final  . CO2 10/23/2020 26  22 - 32 mmol/L Final  . Glucose, Bld 10/23/2020 280* 70 - 99 mg/dL Final   Glucose reference range applies only to samples taken after fasting for at  least 8 hours.  . BUN 10/23/2020 25* 8 - 23 mg/dL Final  . Creatinine, Ser 10/23/2020 1.31* 0.61 - 1.24 mg/dL Final  . Calcium 10/23/2020 9.0  8.9 - 10.3 mg/dL Final  . Total Protein 10/23/2020 7.1  6.5 - 8.1 g/dL Final  . Albumin 10/23/2020 3.8  3.5 - 5.0 g/dL Final  . AST 10/23/2020 30  15 - 41 U/L Final  . ALT 10/23/2020 32  0 - 44 U/L Final  .  Alkaline Phosphatase 10/23/2020 45  38 - 126 U/L Final  . Total Bilirubin 10/23/2020 0.6  0.3 - 1.2 mg/dL Final  . GFR, Estimated 10/23/2020 51* >60 mL/min Final   Comment: (NOTE) Calculated using the CKD-EPI Creatinine Equation (2021)   . Anion gap 10/23/2020 7  5 - 15 Final   Performed at [facility], [address], [place], [place] [address]  . WBC 10/23/2020 49.3* 4.0 - 10.5 K/uL Final  . RBC 10/23/2020 2.98* 4.22 - 5.81 MIL/uL Final  . Hemoglobin 10/23/2020 8.0* 13.0 - 17.0 g/dL Final  . HCT 10/23/2020 25.7* 39.0 - 52.0 % Final  . MCV 10/23/2020 86.2  80.0 - 100.0 fL Final  . MCH 10/23/2020 26.8  26.0 - 34.0 pg Final  . MCHC 10/23/2020 31.1  30.0 - 36.0 g/dL Final  . RDW 10/23/2020 16.3* 11.5 - 15.5 % Final  . Platelets 10/23/2020 122* 150 - 400 K/uL Final   Comment: Immature Platelet Fraction may be clinically indicated, consider ordering this additional test [id number]   . nRBC 10/23/2020 0.3* 0.0 - 0.2 % Final  . Neutrophils Relative % 10/23/2020 51  % Final  . Neutro Abs 10/23/2020 25.2* 1.7 - 7.7 K/uL Final  . Lymphocytes Relative 10/23/2020 11  % Final  . Lymphs Abs 10/23/2020 5.5* 0.7 - 4.0 K/uL Final  . Monocytes Relative 10/23/2020 23  % Final  . Monocytes Absolute 10/23/2020 11.2* 0.1 - 1.0 K/uL Final  . Eosinophils Relative 10/23/2020 1  % Final  . Eosinophils Absolute 10/23/2020 0.4  0.0 - 0.5 K/uL Final  . Basophils Relative 10/23/2020 0  % Final  . Basophils Absolute 10/23/2020 0.1  0.0 - 0.1 K/uL Final  . WBC Morphology 10/23/2020 MILD LEFT SHIFT (1-5% METAS, OCC MYELO, OCC BANDS)   Final  . RBC Morphology 10/23/2020 NO SCHISTOCYTES SEEN   Final  . Smear Review 10/23/2020 Normal platelet morphology   Final  . Immature Granulocytes 10/23/2020 14  % Final  . Abs Immature Granulocytes 10/23/2020 6.94* 0.00 - 0.07 K/uL Final  . Polychromasia 10/23/2020 PRESENT   Final   Performed at [facility], [address] [place], [place]  [address]    Assessment:  Paul Persley. is a 87 y.o. 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 Bone marrowon 09/22/2017 revealed a hypercellular for age with dyspoietic changes variably involving myeloid cell lines, but with main involvement of the granulocytic/monocytic cell line. This was associated with bone marrow monocytosis and borderline number to slight increase in blastic cells. The overall changes favor a primary myeloid neoplasm, particularly a myelodysplastic syndrome especially refractory anemia with excess blasts (RAEB-1) or possibly refractory cytopenia with multilineage dysplasia. Consideration was also given to an evolving myelodysplastic/myeloproliferative neoplasm such as chronic myelomonocytic leukemia but the lack of absolute peripheral monocytosis precluded such a diagnosis at this time. Flow cytometrywas negative. Cytogeneticswere normal (46, XY). Foundation one was positive for ASXL1 R320EB*34, EZH2 Splice site 356-8S>H, RUNX1 [id number]. IPSS-R4.5, intermediate group.  Anemia work-up on 08/17/2017: Vitamin B12 (658),  folate (22.0), and TSH (2.688). Retic was 2.6%. Haptoglobin was 116 on 08/19/2017. Epo levelwas 104.8 on 10/05/2017.  Ferritin has been followed: 106 on 08/17/2017, 131 on 11/04/2017, 144 on 05/30/2018, 394 on 09/01/2018, 441 on 11/28/2018, 394 on 01/02/2019, 480 on 02/06/2019, 392 on 03/13/2019, 527 on 04/17/2019, 533 on 05/29/2019, 476 on 06/05/2019, 517 on 07/17/2019, 685 on 08/28/2019, 724 on 10/16/2019, 749 on 01/08/2020, 95 on 02/08/2020, 811 on 03/07/2020, 1189 on 04/17/2020, 1203 on 05/22/2020, 1483 on 06/19/2020, 1288 on 07/03/2020, and 1627 on 08/05/2020. Iron saturation was 34% on 08/17/2017 and 89% on 09/01/2018.  Bone marrowon 09/22/2017 revealed a hypercellular for age with dyspoietic changes variably involving myeloid cell lines, but with main involvement of the granulocytic/monocytic cell line. This was associated with bone marrow monocytosis and borderline number  to slight increase in blasts (5%). The overall changes favor a primary myeloid neoplasm, particularly a myelodysplastic syndrome especially refractory anemia with excess blasts (RAEB-1) or possibly refractory cytopenia with multilineage dysplasia. Consideration was also given to an evolving myelodysplastic/myeloproliferative neoplasm such as chronic myelomonocytic leukemia but the lack of absolute peripheral monocytosis precluded such a diagnosis at this time. Flow cytometrywas negative. Cytogeneticswere normal (46, XY). Foundation One was positive for ASXL1 S811SR*15, EZH2 Splice site 945-8P>F, RUNX1 [id number].  He has received20cycles ofVidaza(11/04/2017 - 08/22/2018; 10/24/2018-10/23/2019). He has received Aranesp(initially 150 mcg every 2 weeks post chemotherapy then 300 mcg every 1-2 weeks after cycle #3; last 09/19/2018). He last received Aranesp on02/15/2021. He has received 1-2units of PRBCswith each cycle.He has received58 units of PRBCs since 08/17/2017 (last 10/16/2020).  Bone marrowon 08/12/2018 revealed a hypercellular marrow with dyspoietic changes. There was a borderline number of blasts (5%) seen by morphology. The findings were similar to previous biopsy although the cellularity is slightly higher in the current material.consistent with previously known myelodysplastic syndrome. Flow cytometry was negative. Cytogenetics were normal (46, XY).  Bone marrowon 11/27/2019 revealed a hypercellular bone marrow (60-80%) with myelodysplastic syndrome. The morphologic features were similar to the previous biopsy. There was no definite increase in blasts. Flow cytometry revealed no significant CD34-positive blastic population, increase in monocytic cells (up to 18%), Tcells with nonspecific phenotypic changes, and no monoclonal B-cell population identified.Cytogenetics were normal (46, XY). FISH was negative. Next-Gen myeloid disorder  profilerevealsASXL1G633f*15,EZH2splice site 1292-4M>Q,KMMNOTsite [id number].[id number] splice site [id number]*23 There were no abnormalities in FLT3, IDH1, IDH2, NPM1.  [facility] a hypercellular bone marrow (80%) with persistent morphologic dyspoiesis and 9% blasts/promonocytes by manual differential.  Hereceived the PRoweCOVID-19 vaccineon01/01/2020 and 09/27/2019.  Symptomatically, he fatigues easily.  He is losing weight.  WBC is 49,300.  Plan: 1.   Labs today: CBC with diff, CMP, hold tube, uric acid, flow cytometry. 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  He is losing weight..  Exam reveals no adenopathy or hepatosplenomegaly.  Helast received Vidazaon 10/23/2019. Treatmentgoalis todecrease transfusion dependence and prevent acceleration of disease (blasts). [facility] was hypercellular (80%) with persistent morphologic dyspoiesis and 9% blasts/promonocytes by manual differential.  He remains on supportive care (transfusion support alone).   He has received 58 units of PRBCs since initial diagnosis.  Hematocrit 25.7.  Hemoglobin 8.0.  MCV 84.6.  Platelets 122,000.  WBC 49,300 today.   Flow cytometry today.  He denies any bleeding.  He is felt to have progressive disease.  Patient and his wife are considering chemotherapy [facility]  Transfuse 1 unit PRBCs tomorrow. 3. Iron overload Ferritinwas 1627 on 08/05/2020. He has received 58 units of PRBCs to date (10/16/2020). Patient is not on Jadenu. 4.   Transfuse 1 unit PRBCs tomorrow. 5.   MD:  Contact Dr Garnette Scheuermann re:  other treatment options. 6.   RTC in 1 week for labs (CBC, hold tube) and +/- transfusion next day. 7.   RTC in 2 weeks for MD assess,  labs (CBC with diff, BMP, uric acid, hold tube), and +/- transfusion next day.  Addendum:  Flow cytometry revealed absolute aberrant monocytosis, left shifted neutrophils and 4% circulating myeloblasts.  Results are consistent with a myeloproliferative process or a myeloproliferative/myelodysplastic process like CMML.  I discussed the assessment and treatment plan with the patient.  The patient was provided an opportunity to ask questions and all were answered.  The patient agreed with the plan and demonstrated an understanding of the instructions.  The patient was advised to call back if the symptoms worsen or if the condition fails to improve as anticipated.  I provided 16 minutes of face-to-face time during this this encounter and > 50% was spent counseling as documented under my assessment and plan.  An additional 6 minutes were spent reviewing his chart (Epic and Care Everywhere) including notes, labs, and imaging studies and writing transfusion orders.    Lequita Asal, MD, PhD    10/23/2020 , 10:07 AM  I, Mirian Mo Tufford, am acting as a Education administrator for [employer]. Mike Gip, MD.   I, Khairi Garman C. Mike Gip, MD, have reviewed the above documentation for accuracy and completeness, and I agree with the above.

## 2020-10-23 ENCOUNTER — Other Ambulatory Visit: Payer: Self-pay | Admitting: Hematology and Oncology

## 2020-10-23 ENCOUNTER — Inpatient Hospital Stay (HOSPITAL_BASED_OUTPATIENT_CLINIC_OR_DEPARTMENT_OTHER): Payer: Medicare Other | Admitting: Hematology and Oncology

## 2020-10-23 ENCOUNTER — Other Ambulatory Visit: Payer: Self-pay | Admitting: *Deleted

## 2020-10-23 ENCOUNTER — Inpatient Hospital Stay: Payer: Medicare Other

## 2020-10-23 ENCOUNTER — Other Ambulatory Visit: Payer: Self-pay

## 2020-10-23 ENCOUNTER — Encounter: Payer: Self-pay | Admitting: Hematology and Oncology

## 2020-10-23 VITALS — BP 123/50 | HR 80 | Temp 96.7°F | Wt 173.7 lb

## 2020-10-23 DIAGNOSIS — D649 Anemia, unspecified: Secondary | ICD-10-CM

## 2020-10-23 DIAGNOSIS — D72829 Elevated white blood cell count, unspecified: Secondary | ICD-10-CM

## 2020-10-23 DIAGNOSIS — Z7189 Other specified counseling: Secondary | ICD-10-CM

## 2020-10-23 DIAGNOSIS — D469 Myelodysplastic syndrome, unspecified: Secondary | ICD-10-CM

## 2020-10-23 DIAGNOSIS — D4621 Refractory anemia with excess of blasts 1: Secondary | ICD-10-CM | POA: Diagnosis not present

## 2020-10-23 LAB — CBC WITH DIFFERENTIAL/PLATELET
Abs Immature Granulocytes: 6.94 10*3/uL — ABNORMAL HIGH (ref 0.00–0.07)
Basophils Absolute: 0.1 10*3/uL (ref 0.0–0.1)
Basophils Relative: 0 %
Eosinophils Absolute: 0.4 10*3/uL (ref 0.0–0.5)
Eosinophils Relative: 1 %
HCT: 25.7 % — ABNORMAL LOW (ref 39.0–52.0)
Hemoglobin: 8 g/dL — ABNORMAL LOW (ref 13.0–17.0)
Immature Granulocytes: 14 %
Lymphocytes Relative: 11 %
Lymphs Abs: 5.5 10*3/uL — ABNORMAL HIGH (ref 0.7–4.0)
MCH: 26.8 pg (ref 26.0–34.0)
MCHC: 31.1 g/dL (ref 30.0–36.0)
MCV: 86.2 fL (ref 80.0–100.0)
Monocytes Absolute: 11.2 10*3/uL — ABNORMAL HIGH (ref 0.1–1.0)
Monocytes Relative: 23 %
Neutro Abs: 25.2 10*3/uL — ABNORMAL HIGH (ref 1.7–7.7)
Neutrophils Relative %: 51 %
Platelets: 122 10*3/uL — ABNORMAL LOW (ref 150–400)
RBC Morphology: NONE SEEN
RBC: 2.98 MIL/uL — ABNORMAL LOW (ref 4.22–5.81)
RDW: 16.3 % — ABNORMAL HIGH (ref 11.5–15.5)
Smear Review: NORMAL
WBC: 49.3 10*3/uL — ABNORMAL HIGH (ref 4.0–10.5)
nRBC: 0.3 % — ABNORMAL HIGH (ref 0.0–0.2)

## 2020-10-23 LAB — COMPREHENSIVE METABOLIC PANEL
ALT: 32 U/L (ref 0–44)
AST: 30 U/L (ref 15–41)
Albumin: 3.8 g/dL (ref 3.5–5.0)
Alkaline Phosphatase: 45 U/L (ref 38–126)
Anion gap: 7 (ref 5–15)
BUN: 25 mg/dL — ABNORMAL HIGH (ref 8–23)
CO2: 26 mmol/L (ref 22–32)
Calcium: 9 mg/dL (ref 8.9–10.3)
Chloride: 100 mmol/L (ref 98–111)
Creatinine, Ser: 1.31 mg/dL — ABNORMAL HIGH (ref 0.61–1.24)
GFR, Estimated: 51 mL/min — ABNORMAL LOW (ref >60)
Glucose, Bld: 280 mg/dL — ABNORMAL HIGH (ref 70–99)
Potassium: 4.5 mmol/L (ref 3.5–5.1)
Sodium: 133 mmol/L — ABNORMAL LOW (ref 135–145)
Total Bilirubin: 0.6 mg/dL (ref 0.3–1.2)
Total Protein: 7.1 g/dL (ref 6.5–8.1)

## 2020-10-23 LAB — PREPARE RBC (CROSSMATCH)

## 2020-10-23 LAB — SAMPLE TO BLOOD BANK

## 2020-10-23 LAB — URIC ACID: Uric Acid, Serum: 7.9 mg/dL (ref 3.7–8.6)

## 2020-10-24 ENCOUNTER — Other Ambulatory Visit: Payer: Self-pay

## 2020-10-24 ENCOUNTER — Inpatient Hospital Stay: Payer: Medicare Other

## 2020-10-24 DIAGNOSIS — D649 Anemia, unspecified: Secondary | ICD-10-CM

## 2020-10-24 DIAGNOSIS — D469 Myelodysplastic syndrome, unspecified: Secondary | ICD-10-CM

## 2020-10-24 DIAGNOSIS — D4621 Refractory anemia with excess of blasts 1: Secondary | ICD-10-CM | POA: Diagnosis not present

## 2020-10-24 MED ORDER — SODIUM CHLORIDE 0.9% IV SOLUTION
250.0000 mL | Freq: Once | INTRAVENOUS | Status: AC
  Administered 2020-10-24: 250 mL via INTRAVENOUS
  Filled 2020-10-24: qty 250

## 2020-10-24 NOTE — Progress Notes (Signed)
Patient received prescribed treatment in clinic. 1 unit PRBC.  Tolerated well. Patient stable at discharge.

## 2020-10-25 LAB — COMP PANEL: LEUKEMIA/LYMPHOMA: Immunophenotypic Profile: 4

## 2020-10-25 LAB — TYPE AND SCREEN
ABO/RH(D): B POS
Antibody Screen: NEGATIVE
Unit division: 0

## 2020-10-25 LAB — BPAM RBC
Blood Product Expiration Date: 202203112359
ISSUE DATE / TIME: 202202240912
Unit Type and Rh: 1700

## 2020-10-30 ENCOUNTER — Inpatient Hospital Stay: Payer: Medicare Other | Attending: Hematology and Oncology

## 2020-10-30 ENCOUNTER — Other Ambulatory Visit: Payer: Self-pay

## 2020-10-30 ENCOUNTER — Other Ambulatory Visit: Payer: Self-pay | Admitting: *Deleted

## 2020-10-30 ENCOUNTER — Other Ambulatory Visit: Payer: Self-pay | Admitting: Hematology and Oncology

## 2020-10-30 DIAGNOSIS — D4621 Refractory anemia with excess of blasts 1: Secondary | ICD-10-CM | POA: Diagnosis present

## 2020-10-30 DIAGNOSIS — D469 Myelodysplastic syndrome, unspecified: Secondary | ICD-10-CM

## 2020-10-30 DIAGNOSIS — D649 Anemia, unspecified: Secondary | ICD-10-CM

## 2020-10-30 DIAGNOSIS — D696 Thrombocytopenia, unspecified: Secondary | ICD-10-CM | POA: Insufficient documentation

## 2020-10-30 DIAGNOSIS — R197 Diarrhea, unspecified: Secondary | ICD-10-CM | POA: Diagnosis not present

## 2020-10-30 LAB — CBC
HCT: 25.7 % — ABNORMAL LOW (ref 39.0–52.0)
Hemoglobin: 8 g/dL — ABNORMAL LOW (ref 13.0–17.0)
MCH: 26.5 pg (ref 26.0–34.0)
MCHC: 31.1 g/dL (ref 30.0–36.0)
MCV: 85.1 fL (ref 80.0–100.0)
Platelets: 108 10*3/uL — ABNORMAL LOW (ref 150–400)
RBC: 3.02 MIL/uL — ABNORMAL LOW (ref 4.22–5.81)
RDW: 16.4 % — ABNORMAL HIGH (ref 11.5–15.5)
WBC: 57.7 10*3/uL (ref 4.0–10.5)
nRBC: 0.3 % — ABNORMAL HIGH (ref 0.0–0.2)

## 2020-10-30 LAB — SAMPLE TO BLOOD BANK

## 2020-10-30 LAB — PREPARE RBC (CROSSMATCH)

## 2020-10-31 ENCOUNTER — Inpatient Hospital Stay: Payer: Medicare Other

## 2020-10-31 DIAGNOSIS — D4621 Refractory anemia with excess of blasts 1: Secondary | ICD-10-CM | POA: Diagnosis not present

## 2020-10-31 DIAGNOSIS — D649 Anemia, unspecified: Secondary | ICD-10-CM

## 2020-10-31 DIAGNOSIS — D469 Myelodysplastic syndrome, unspecified: Secondary | ICD-10-CM

## 2020-10-31 MED ORDER — SODIUM CHLORIDE 0.9% IV SOLUTION
250.0000 mL | Freq: Once | INTRAVENOUS | Status: DC
  Filled 2020-10-31: qty 250

## 2020-10-31 NOTE — Progress Notes (Signed)
Pt tolerated one unit PRBCs without difficulty, VSS, d/ced to home

## 2020-11-01 LAB — TYPE AND SCREEN
ABO/RH(D): B POS
Antibody Screen: NEGATIVE
Unit division: 0

## 2020-11-01 LAB — BPAM RBC
Blood Product Expiration Date: 202203252359
ISSUE DATE / TIME: 202203030909
Unit Type and Rh: 7300

## 2020-11-05 NOTE — Progress Notes (Signed)
[facility]  [address] [place], [place] [address] Phone: [phone]  Fax: [phone]   Clinic Day:  11/06/20   Referring physician: Lequita Asal, MD  Chief Complaint: Paul Rood. is a 87 y.o. 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  HPI: The patient was last seen in the hematology clinic on 10/23/2020. At that time, he fatigued easily. He was losing weight. Hematocrit was 25.7, hemoglobin 8.0, MCV 86.2, platelets 122,000, WBC 49,300. Sodium was 133. Creatinine was 1.31 (CrCl 51 ml/min). Uric acid was 7.9.  He received 1 unit of PRBC the next day.  Flow cytometry revealed absolute aberrant monocytosis, left shifted neutrophils and 4% circulating myeloblasts.  Results were c/w a myeloproliferative process or a myeloproliferative/myelodysplastic process like CMML.  CBC on 10/30/2020 revealed a hematocrit of 25.7, hemoglobin 8.0, MCV 85.1, platelets 108,000, WBC 57,700. He received 1 unit of PRBC on 10/31/2020.  During the interim, he has been okay. He is eating well. He is weak and gets fatigued easily. He denies bone pain or any other symptoms. He has not been able to go for walks lately.  The patient would like supportive care at this time. He states that he is DNI but would like chest compressions if needed. They will bring a copy of their living will to their next visit. Their sons are their medical POA.   Past Medical History:  Diagnosis Date  . Anemia   . Arthritis   . BPH (benign prostatic hypertrophy)   . Cancer ([place])   . Complication of anesthesia    nausea  . Diabetes type 2, controlled ([place])   . HOH (hard of hearing)    r and L ears-70% loss per pt  . Hyperlipidemia   . Hypertension   . Myelodysplasia (myelodysplastic syndrome) ([place]) 2019    Past Surgical History:  Procedure Laterality Date  . APPENDECTOMY    . COLONOSCOPY  09/21/08   1 polyp found, tubular adenoma  . HAND SURGERY    . KNEE  SURGERY Left     Family History  Problem Relation Age of Onset  . Aneurysm Mother   . Heart disease Father   . Cancer Brother        skin  . Heart disease Brother   . Heart disease Brother   . Cancer Sister        skin  . Aneurysm Brother   . Heart disease Brother   . Heart disease Sister   . Heart disease Sister   . COPD Sister   . Diabetes Sister     Social History:  reports that he has never smoked. He has never used smokeless tobacco. He reports that he does not drink alcohol and does not use drugs. 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 Patient denies known exposures to radiation on toxins. 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 He had his 26-year marriage anniversary on 08/23/2018.He is walking 1-35mles per day.[place] The patient is accompanied by his wife today.  Allergies: No Known Allergies  Current Medications: Current Outpatient Medications  Medication Sig Dispense Refill  . aspirin 81 MG chewable tablet Chew 81 mg by mouth daily.    . Calcium Carbonate-Vit D-Min (CALCIUM 1200 PO) Take 1,200 mg by mouth in the morning and at bedtime.    . Cranberry (THERACRAN PO) Take 1 tablet by mouth daily.     .Marland Kitchen  cyanocobalamin 1000 MCG tablet Take 1,000 mcg by mouth daily.    . finasteride (PROSCAR) 5 MG tablet Take 5 mg by mouth daily.    Marland Kitchen glucose blood test strip FreeStyle Lite Strips    . LANCETS ULTRA FINE MISC Lancets,Ultra Thin 26 gauge    . lisinopril (PRINIVIL,ZESTRIL) 10 MG tablet Take 10 mg by mouth daily.    Marland Kitchen loperamide (IMODIUM) 2 MG capsule Take 1 capsule (2 mg total) by mouth See admin instructions. With onset of loose stool, take 78m followed by 264mevery 2 hours until loose bowel movement stopped. Maximum: 16 mg/day 30 capsule 0  . meclizine (ANTIVERT) 25 MG tablet Take 25 mg by mouth 2 (two) times daily as needed.     . metformin (FORTAMET) 1000 MG (OSM) 24 hr tablet Take 1,000 mg by  mouth 2 (two) times daily with a meal.    . Multiple Vitamins-Minerals (CENTRUM SILVER PO) Take by mouth.    . Omega-3 Fatty Acids (FISH OIL PO) Take 1 tablet by mouth daily.     . Polyethylene Glycol 3350 (MIRALAX PO) Take by mouth as needed.     . Clarnce Flockalmetto-Phytosterols (PROSTATE SR PO) Take 1 tablet by mouth daily.     . simvastatin (ZOCOR) 40 MG tablet Take 40 mg by mouth daily.    . Marland Kitchenlimepiride (AMARYL) 1 MG tablet Take 1 tablet by mouth daily after breakfast.    . mupirocin ointment (BACTROBAN) 2 % Apply 1 application topically daily. With dressing changes (Patient not taking: No sig reported) 22 g 0  . ondansetron (ZOFRAN) 4 MG tablet Take 1 tablet (4 mg total) by mouth every 6 (six) hours as needed for nausea or vomiting. (Patient not taking: No sig reported) 30 tablet 2   No current facility-administered medications for this visit.   Facility-Administered Medications Ordered in Other Visits  Medication Dose Route Frequency Provider Last Rate Last Admin  . acetaminophen (TYLENOL) tablet 650 mg  650 mg Oral Once CoLequita AsalMD      . acetaminophen (TYLENOL) tablet 650 mg  650 mg Oral Once CoLequita AsalMD      . acetaminophen (TYLENOL) tablet 650 mg  650 mg Oral Once CoLequita AsalMD      . acetaminophen (TYLENOL) tablet 650 mg  650 mg Oral Once CoLequita AsalMD      . diphenhydrAMINE (BENADRYL) capsule 25 mg  25 mg Oral Once CoNolon Stalls, MD      . diphenhydrAMINE (BENADRYL) capsule 25 mg  25 mg Oral Once CoNolon Stalls, MD      . diphenhydrAMINE (BENADRYL) capsule 25 mg  25 mg Oral Once CoNolon Stalls, MD      . diphenhydrAMINE (BENADRYL) capsule 25 mg  25 mg Oral Once CoLequita AsalMD        Review of Systems  Constitutional: Positive for malaise/fatigue. Negative for chills, diaphoresis, fever and weight loss (up 3 lbs).  HENT: Positive for hearing loss. Negative for congestion, ear discharge, ear pain, nosebleeds, sinus  pain, sore throat and tinnitus.   Eyes: Negative.  Negative for blurred vision.  Respiratory: Negative.  Negative for cough, hemoptysis, sputum production and shortness of breath.   Cardiovascular: Negative.  Negative for chest pain, palpitations and leg swelling.  Gastrointestinal: Negative.  Negative for abdominal pain, blood in stool, constipation, diarrhea, heartburn, melena, nausea and vomiting.       Eating well.  Genitourinary: Negative.  Negative  for dysuria, frequency, hematuria and urgency.  Musculoskeletal: Negative.  Negative for back pain, joint pain, myalgias and neck pain.  Skin: Negative for itching and rash.  Neurological: Positive for weakness. Negative for dizziness, tingling, sensory change and headaches.  Endo/Heme/Allergies: Does not bruise/bleed easily.       Diabetes.  Psychiatric/Behavioral: Negative.  Negative for depression and memory loss. The patient is not nervous/anxious and does not have insomnia.   All other systems reviewed and are negative.  Performance status (ECOG): 1  Vitals Blood pressure (!) 125/43, pulse 71, temperature (!) 97.5 F (36.4 C), temperature source Tympanic, resp. rate 18, weight 176 lb 5.9 oz (80 kg), SpO2 100 %.   Physical Exam Vitals and nursing note reviewed.  Constitutional:      General: He is not in acute distress.    Appearance: He is well-developed. He is not diaphoretic.  HENT:     Head: Normocephalic and atraumatic.     Comments: Male pattern baldness.    Mouth/Throat:     Mouth: Mucous membranes are moist.     Pharynx: Oropharynx is clear. No oropharyngeal exudate.  Eyes:     General: No scleral icterus.    Extraocular Movements: Extraocular movements intact.     Conjunctiva/sclera: Conjunctivae normal.     Pupils: Pupils are equal, round, and reactive to light.     Comments: Gold rimmed glasses.  Blue eyes.   Cardiovascular:     Rate and Rhythm: Normal rate and regular rhythm.     Heart sounds: Normal heart  sounds. No murmur heard.   Pulmonary:     Effort: Pulmonary effort is normal. No respiratory distress.     Breath sounds: Normal breath sounds. No wheezing or rales.  Chest:     Chest wall: No tenderness.  Breasts:     Right: No axillary adenopathy or supraclavicular adenopathy.     Left: No axillary adenopathy or supraclavicular adenopathy.    Abdominal:     General: Bowel sounds are normal. There is no distension.     Palpations: Abdomen is soft. There is no hepatomegaly, splenomegaly or mass.     Tenderness: There is no abdominal tenderness. There is no guarding or rebound.  Musculoskeletal:        General: No swelling or tenderness. Normal range of motion.     Cervical back: Normal range of motion and neck supple.  Lymphadenopathy:     Head:     Right side of head: No preauricular, posterior auricular or occipital adenopathy.     Left side of head: No preauricular, posterior auricular or occipital adenopathy.     Cervical: No cervical adenopathy.     Upper Body:     Right upper body: No supraclavicular or axillary adenopathy.     Left upper body: No supraclavicular or axillary adenopathy.     Lower Body: No right inguinal adenopathy. No left inguinal adenopathy.  Skin:    General: Skin is warm and dry.  Neurological:     Mental Status: He is alert and oriented to person, place, and time.  Psychiatric:        Behavior: Behavior normal.        Thought Content: Thought content normal.        Judgment: Judgment normal.    Appointment on 11/06/2020  Component Date Value Ref Range Status  . Uric Acid, Serum 11/06/2020 7.1  3.7 - 8.6 mg/dL Final   Performed at [facility], [place]  [address] [place], [place] [address]  . Sodium 11/06/2020 136  135 - 145 mmol/L Final  . Potassium 11/06/2020 4.2  3.5 - 5.1 mmol/L Final  . Chloride 11/06/2020 101  98 - 111 mmol/L Final  . CO2 11/06/2020 22  22 - 32 mmol/L Final  . Glucose, Bld 11/06/2020 279* 70 - 99 mg/dL Final    Glucose reference range applies only to samples taken after fasting for at least 8 hours.  . BUN 11/06/2020 24* 8 - 23 mg/dL Final  . Creatinine, Ser 11/06/2020 1.25* 0.61 - 1.24 mg/dL Final  . Calcium 11/06/2020 9.2  8.9 - 10.3 mg/dL Final  . GFR, Estimated 11/06/2020 54* >60 mL/min Final   Comment: (NOTE) Calculated using the CKD-EPI Creatinine Equation (2021)   . Anion gap 11/06/2020 13  5 - 15 Final   Performed at [facility], [address] [place], [place] [address]    Assessment:  Paul Chuba. is a 87 y.o. 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 Bone marrowon 09/22/2017 revealed a hypercellular for age with dyspoietic changes variably involving myeloid cell lines, but with main involvement of the granulocytic/monocytic cell line. This was associated with bone marrow monocytosis and borderline number to slight increase in blastic cells. The overall changes favor a primary myeloid neoplasm, particularly a myelodysplastic syndrome especially refractory anemia with excess blasts (RAEB-1) or possibly refractory cytopenia with multilineage dysplasia. Consideration was also given to an evolving myelodysplastic/myeloproliferative neoplasm such as chronic myelomonocytic leukemia but the lack of absolute peripheral monocytosis precluded such a diagnosis at this time. Flow cytometrywas negative. Cytogeneticswere normal (46, XY). Foundation one was positive for ASXL1 Y174BS*49, EZH2 Splice site 675-9F>M, RUNX1 [id number]. IPSS-R4.5, intermediate group.  Anemia work-up on 08/17/2017: Vitamin B12 (658), folate (22.0), and TSH (2.688). Retic was 2.6%. Haptoglobin was 116 on 08/19/2017. Epo levelwas 104.8 on 10/05/2017.  Ferritin has been followed: 106 on 08/17/2017, 131 on 11/04/2017, 144 on 05/30/2018, 394 on 09/01/2018, 441 on 11/28/2018, 394 on 01/02/2019, 480 on 02/06/2019, 392 on 03/13/2019, 527 on 04/17/2019, 533 on 05/29/2019, 476 on 06/05/2019, 517 on 07/17/2019, 685 on 08/28/2019, 724 on  10/16/2019, 749 on 01/08/2020, 95 on 02/08/2020, 811 on 03/07/2020, 1189 on 04/17/2020, 1203 on 05/22/2020, 1483 on 06/19/2020, 1288 on 07/03/2020, and 1627 on 08/05/2020. Iron saturation was 34% on 08/17/2017 and 89% on 09/01/2018.  Bone marrowon 09/22/2017 revealed a hypercellular for age with dyspoietic changes variably involving myeloid cell lines, but with main involvement of the granulocytic/monocytic cell line. This was associated with bone marrow monocytosis and borderline number to slight increase in blasts (5%). The overall changes favor a primary myeloid neoplasm, particularly a myelodysplastic syndrome especially refractory anemia with excess blasts (RAEB-1) or possibly refractory cytopenia with multilineage dysplasia. Consideration was also given to an evolving myelodysplastic/myeloproliferative neoplasm such as chronic myelomonocytic leukemia but the lack of absolute peripheral monocytosis precluded such a diagnosis at this time. Flow cytometrywas negative. Cytogeneticswere normal (46, XY). Foundation One was positive for ASXL1 B846KZ*99, EZH2 Splice site 357-0V>X, RUNX1 [id number].  He has received20cycles ofVidaza(11/04/2017 - 08/22/2018; 10/24/2018-10/23/2019). He has received Aranesp(initially 150 mcg every 2 weeks post chemotherapy then 300 mcg every 1-2 weeks after cycle #3; last 09/19/2018). He last received Aranesp on02/15/2021. He has received 1-2units of PRBCswith each cycle.He has received60 units of PRBCs since 08/17/2017 (last 10/31/2020).  Bone marrowon 08/12/2018 revealed a hypercellular marrow with dyspoietic changes. There was a borderline number of blasts (5%) seen by morphology. The findings were similar to previous biopsy although the cellularity is slightly  higher in the current material.consistent with previously known myelodysplastic syndrome. Flow cytometry was negative. Cytogenetics were normal (46, XY).  Bone marrowon 11/27/2019 revealed a  hypercellular bone marrow (60-80%) with myelodysplastic syndrome. The morphologic features were similar to the previous biopsy. There was no definite increase in blasts. Flow cytometry revealed no significant CD34-positive blastic population, increase in monocytic cells (up to 18%), Tcells with nonspecific phenotypic changes, and no monoclonal B-cell population identified.Cytogenetics were normal (46, XY). FISH was negative. Next-Gen myeloid disorder profilerevealsASXL1G643f*15,EZH2splice site 1295-2W>U,XLKGMWsite cN.027-2ZDG splice site [id number].[id number]_[id number],EPPI9J188C*16 There were no abnormalities in FLT3, IDH1, IDH2, NPM1.  [facility] a hypercellular bone marrow (80%) with persistent morphologic dyspoiesis and 9% blasts/promonocytes by manual differential.  Flow cytometry on 10/23/2020 revealed absolute aberrant monocytosis, left shifted neutrophils and 4% circulating myeloblasts.  Results were c/w a myeloproliferative process or a myeloproliferative/myelodysplastic process   Hereceived the P[place]maCOVID-19 vaccineon01/01/2020 and 09/27/2019.  Symptomatically, he is weak and gets fatigued easily. He denies bone pain or any other symptoms. He has not been able to go for walks lately.  Exam reveals no adenopathy or hepatosplenomegaly.  Plan: 1.   Labs today: CBC with diff, BMP, uric acid, hold tube. 2. Refractory anemia with excess blasts (RAEB-1) Clinically, he fatigues easily.  Exam reveals no adenopathy or hepatosplenomegaly.  Helast received Vidazaon 10/23/2019. Treatmentgoalis todecrease transfusion dependence and prevent acceleration of disease (blasts). [facility] hypercellular (80%) with persistent morphologic dyspoiesis and 9% blasts/promonocytes.  Flow cytometry on 10/23/2020 revealed absolute aberrant monocytosis, left shifted neutrophils  and 4% circulating myeloblasts.   He remains on supportive care (transfusion support alone).   He has received 60 units of PRBCs since initial diagnosis.  Hematocrit 26.8.  Hemoglobin 8.4.  MCV 85.1.  Platelets 102,000.  WBC 67,400 today.  He denies any bleeding.  He has progressive disease with ongoing transfusion dependence, declining platelet count and rising WBC.  Patient wishes to continue supportive care alone (transfusions). 3. Leukocytosis and thrombocytopenia  WBC 67,400.  Platelets 102,000.  No evidence of leukostasis.  No bleeding.  Continue to monitor.    Consider addition of hydroxyurea. 4.   RTC in weekly x 3 for labs (CBC, hold tube) and +/- transfusion next day. 5.   RTC in  4 weeks for MD assessment, labs (CBC with diff, hold tube), and +/- transfusion next day.  I discussed the assessment and treatment plan with the patient.  The patient was provided an opportunity to ask questions and all were answered.  The patient agreed with the plan and demonstrated an understanding of the instructions.  The patient was advised to call back if the symptoms worsen or if the condition fails to improve as anticipated.   MLequita Asal MD, PhD    11/06/2020 , 11:05 AM  I, EMirian MoTufford, am acting as a sEducation administratorfor MCal[employer] Corporation CMike Gip MD.   I, Terre Hanneman C. CMike Gip MD, have reviewed the above documentation for accuracy and completeness, and I agree with the above.

## 2020-11-06 ENCOUNTER — Encounter: Payer: Self-pay | Admitting: Hematology and Oncology

## 2020-11-06 ENCOUNTER — Inpatient Hospital Stay: Payer: Medicare Other

## 2020-11-06 ENCOUNTER — Inpatient Hospital Stay (HOSPITAL_BASED_OUTPATIENT_CLINIC_OR_DEPARTMENT_OTHER): Payer: Medicare Other | Admitting: Hematology and Oncology

## 2020-11-06 ENCOUNTER — Other Ambulatory Visit: Payer: Self-pay

## 2020-11-06 VITALS — BP 125/43 | HR 71 | Temp 97.5°F | Resp 18 | Wt 176.4 lb

## 2020-11-06 DIAGNOSIS — D696 Thrombocytopenia, unspecified: Secondary | ICD-10-CM | POA: Diagnosis not present

## 2020-11-06 DIAGNOSIS — D72829 Elevated white blood cell count, unspecified: Secondary | ICD-10-CM

## 2020-11-06 DIAGNOSIS — D469 Myelodysplastic syndrome, unspecified: Secondary | ICD-10-CM | POA: Diagnosis not present

## 2020-11-06 DIAGNOSIS — Z7189 Other specified counseling: Secondary | ICD-10-CM | POA: Diagnosis not present

## 2020-11-06 DIAGNOSIS — D4621 Refractory anemia with excess of blasts 1: Secondary | ICD-10-CM | POA: Diagnosis not present

## 2020-11-06 LAB — BASIC METABOLIC PANEL
Anion gap: 13 (ref 5–15)
BUN: 24 mg/dL — ABNORMAL HIGH (ref 8–23)
CO2: 22 mmol/L (ref 22–32)
Calcium: 9.2 mg/dL (ref 8.9–10.3)
Chloride: 101 mmol/L (ref 98–111)
Creatinine, Ser: 1.25 mg/dL — ABNORMAL HIGH (ref 0.61–1.24)
GFR, Estimated: 54 mL/min — ABNORMAL LOW (ref >60)
Glucose, Bld: 279 mg/dL — ABNORMAL HIGH (ref 70–99)
Potassium: 4.2 mmol/L (ref 3.5–5.1)
Sodium: 136 mmol/L (ref 135–145)

## 2020-11-06 LAB — CBC WITH DIFFERENTIAL/PLATELET
Abs Immature Granulocytes: 11.43 10*3/uL — ABNORMAL HIGH (ref 0.00–0.07)
Basophils Absolute: 0.2 10*3/uL — ABNORMAL HIGH (ref 0.0–0.1)
Basophils Relative: 0 %
Eosinophils Absolute: 0.4 10*3/uL (ref 0.0–0.5)
Eosinophils Relative: 1 %
HCT: 26.8 % — ABNORMAL LOW (ref 39.0–52.0)
Hemoglobin: 8.4 g/dL — ABNORMAL LOW (ref 13.0–17.0)
Immature Granulocytes: 17 %
Lymphocytes Relative: 14 %
Lymphs Abs: 9.2 10*3/uL — ABNORMAL HIGH (ref 0.7–4.0)
MCH: 26.7 pg (ref 26.0–34.0)
MCHC: 31.3 g/dL (ref 30.0–36.0)
MCV: 85.1 fL (ref 80.0–100.0)
Monocytes Absolute: 13.1 10*3/uL — ABNORMAL HIGH (ref 0.1–1.0)
Monocytes Relative: 19 %
Neutro Abs: 33.1 10*3/uL — ABNORMAL HIGH (ref 1.7–7.7)
Neutrophils Relative %: 49 %
Platelets: 102 10*3/uL — ABNORMAL LOW (ref 150–400)
RBC: 3.15 MIL/uL — ABNORMAL LOW (ref 4.22–5.81)
RDW: 16.2 % — ABNORMAL HIGH (ref 11.5–15.5)
Smear Review: NORMAL
WBC: 67.4 10*3/uL (ref 4.0–10.5)
nRBC: 0.2 % (ref 0.0–0.2)

## 2020-11-06 LAB — URIC ACID: Uric Acid, Serum: 7.1 mg/dL (ref 3.7–8.6)

## 2020-11-06 LAB — SAMPLE TO BLOOD BANK

## 2020-11-06 NOTE — Progress Notes (Signed)
Patient reports some diarrhea and some weakness. Patient states he "feels 55"

## 2020-11-07 ENCOUNTER — Other Ambulatory Visit: Payer: TRICARE For Life (TFL)

## 2020-11-07 ENCOUNTER — Inpatient Hospital Stay: Payer: Medicare Other

## 2020-11-07 ENCOUNTER — Ambulatory Visit: Payer: TRICARE For Life (TFL) | Admitting: Hematology and Oncology

## 2020-11-13 ENCOUNTER — Other Ambulatory Visit: Payer: Self-pay

## 2020-11-13 ENCOUNTER — Other Ambulatory Visit: Payer: Self-pay | Admitting: *Deleted

## 2020-11-13 ENCOUNTER — Other Ambulatory Visit: Payer: Self-pay | Admitting: Hematology and Oncology

## 2020-11-13 ENCOUNTER — Inpatient Hospital Stay: Payer: Medicare Other

## 2020-11-13 DIAGNOSIS — D649 Anemia, unspecified: Secondary | ICD-10-CM

## 2020-11-13 DIAGNOSIS — D696 Thrombocytopenia, unspecified: Secondary | ICD-10-CM

## 2020-11-13 DIAGNOSIS — D469 Myelodysplastic syndrome, unspecified: Secondary | ICD-10-CM

## 2020-11-13 DIAGNOSIS — D72829 Elevated white blood cell count, unspecified: Secondary | ICD-10-CM

## 2020-11-13 DIAGNOSIS — D4621 Refractory anemia with excess of blasts 1: Secondary | ICD-10-CM | POA: Diagnosis not present

## 2020-11-13 LAB — SAMPLE TO BLOOD BANK

## 2020-11-13 LAB — CBC
HCT: 24.4 % — ABNORMAL LOW (ref 39.0–52.0)
Hemoglobin: 7.6 g/dL — ABNORMAL LOW (ref 13.0–17.0)
MCH: 26.2 pg (ref 26.0–34.0)
MCHC: 31.1 g/dL (ref 30.0–36.0)
MCV: 84.1 fL (ref 80.0–100.0)
Platelets: 101 10*3/uL — ABNORMAL LOW (ref 150–400)
RBC: 2.9 MIL/uL — ABNORMAL LOW (ref 4.22–5.81)
RDW: 16.6 % — ABNORMAL HIGH (ref 11.5–15.5)
WBC: 73 10*3/uL (ref 4.0–10.5)
nRBC: 0.3 % — ABNORMAL HIGH (ref 0.0–0.2)

## 2020-11-13 LAB — PREPARE RBC (CROSSMATCH)

## 2020-11-14 ENCOUNTER — Other Ambulatory Visit: Payer: Self-pay

## 2020-11-14 ENCOUNTER — Inpatient Hospital Stay: Payer: Medicare Other

## 2020-11-14 DIAGNOSIS — D649 Anemia, unspecified: Secondary | ICD-10-CM

## 2020-11-14 DIAGNOSIS — D469 Myelodysplastic syndrome, unspecified: Secondary | ICD-10-CM

## 2020-11-14 DIAGNOSIS — D4621 Refractory anemia with excess of blasts 1: Secondary | ICD-10-CM | POA: Diagnosis not present

## 2020-11-14 MED ORDER — SODIUM CHLORIDE 0.9% IV SOLUTION
250.0000 mL | Freq: Once | INTRAVENOUS | Status: AC
  Administered 2020-11-14: 250 mL via INTRAVENOUS
  Filled 2020-11-14: qty 250

## 2020-11-14 MED ORDER — DIPHENHYDRAMINE HCL 25 MG PO CAPS: 25.0000 mg | ORAL_CAPSULE | Freq: Once | ORAL | Status: DC

## 2020-11-14 MED ORDER — ACETAMINOPHEN 325 MG PO TABS: 650.0000 mg | ORAL_TABLET | Freq: Once | ORAL | Status: DC

## 2020-11-15 LAB — TYPE AND SCREEN
ABO/RH(D): B POS
Antibody Screen: NEGATIVE
Unit division: 0

## 2020-11-15 LAB — BPAM RBC
Blood Product Expiration Date: 202203212359
ISSUE DATE / TIME: 202203170903
Unit Type and Rh: 7300

## 2020-11-20 ENCOUNTER — Telehealth: Payer: Self-pay | Admitting: *Deleted

## 2020-11-20 ENCOUNTER — Other Ambulatory Visit: Payer: Self-pay | Admitting: *Deleted

## 2020-11-20 ENCOUNTER — Other Ambulatory Visit: Payer: Self-pay

## 2020-11-20 ENCOUNTER — Inpatient Hospital Stay: Payer: Medicare Other

## 2020-11-20 ENCOUNTER — Other Ambulatory Visit: Payer: Self-pay | Admitting: Hematology and Oncology

## 2020-11-20 DIAGNOSIS — D469 Myelodysplastic syndrome, unspecified: Secondary | ICD-10-CM

## 2020-11-20 DIAGNOSIS — D649 Anemia, unspecified: Secondary | ICD-10-CM

## 2020-11-20 DIAGNOSIS — D696 Thrombocytopenia, unspecified: Secondary | ICD-10-CM

## 2020-11-20 DIAGNOSIS — D72829 Elevated white blood cell count, unspecified: Secondary | ICD-10-CM

## 2020-11-20 DIAGNOSIS — D4621 Refractory anemia with excess of blasts 1: Secondary | ICD-10-CM | POA: Diagnosis not present

## 2020-11-20 LAB — CBC
HCT: 25.4 % — ABNORMAL LOW (ref 39.0–52.0)
Hemoglobin: 8 g/dL — ABNORMAL LOW (ref 13.0–17.0)
MCH: 26.6 pg (ref 26.0–34.0)
MCHC: 31.5 g/dL (ref 30.0–36.0)
MCV: 84.4 fL (ref 80.0–100.0)
Platelets: 107 10*3/uL — ABNORMAL LOW (ref 150–400)
RBC: 3.01 MIL/uL — ABNORMAL LOW (ref 4.22–5.81)
RDW: 16.6 % — ABNORMAL HIGH (ref 11.5–15.5)
WBC: 100.3 10*3/uL (ref 4.0–10.5)
nRBC: 0.3 % — ABNORMAL HIGH (ref 0.0–0.2)

## 2020-11-20 LAB — SAMPLE TO BLOOD BANK

## 2020-11-20 LAB — PREPARE RBC (CROSSMATCH)

## 2020-11-20 MED ORDER — HYDROXYUREA 500 MG PO CAPS: 500.0000 mg | ORAL_CAPSULE | ORAL | 0 refills | Status: DC

## 2020-11-20 NOTE — Progress Notes (Signed)
Dr Mike Gip notified of a critical lab value, wbc count of 100.3

## 2020-11-20 NOTE — Telephone Encounter (Signed)
Dr Mike Gip asked me to call and check on rx for the pt. The cost is 2.83. I let pt know  And she will go and get med and start it today. They have a blood transfusion scheduled  For tom. At 9 am. Family is aware

## 2020-11-21 ENCOUNTER — Inpatient Hospital Stay: Payer: Medicare Other

## 2020-11-21 DIAGNOSIS — D649 Anemia, unspecified: Secondary | ICD-10-CM

## 2020-11-21 DIAGNOSIS — D469 Myelodysplastic syndrome, unspecified: Secondary | ICD-10-CM

## 2020-11-21 DIAGNOSIS — D4621 Refractory anemia with excess of blasts 1: Secondary | ICD-10-CM | POA: Diagnosis not present

## 2020-11-21 MED ORDER — SODIUM CHLORIDE 0.9% IV SOLUTION
250.0000 mL | Freq: Once | INTRAVENOUS | Status: AC
  Administered 2020-11-21: 250 mL via INTRAVENOUS
  Filled 2020-11-21: qty 250

## 2020-11-21 MED ORDER — DIPHENHYDRAMINE HCL 25 MG PO CAPS: 25.0000 mg | ORAL_CAPSULE | Freq: Once | ORAL | Status: DC

## 2020-11-21 MED ORDER — ACETAMINOPHEN 325 MG PO TABS: 650.0000 mg | ORAL_TABLET | Freq: Once | ORAL | Status: DC

## 2020-11-21 NOTE — Progress Notes (Signed)
Pt received prescribed treatment in clinic, pt stable at d/c. 

## 2020-11-22 LAB — TYPE AND SCREEN
ABO/RH(D): B POS
Antibody Screen: NEGATIVE
Unit division: 0

## 2020-11-22 LAB — BPAM RBC
Blood Product Expiration Date: 202204142359
ISSUE DATE / TIME: 202203240909
Unit Type and Rh: 7300

## 2020-11-27 ENCOUNTER — Other Ambulatory Visit: Payer: Self-pay | Admitting: Hematology and Oncology

## 2020-11-27 ENCOUNTER — Telehealth: Payer: Self-pay | Admitting: *Deleted

## 2020-11-27 ENCOUNTER — Other Ambulatory Visit: Payer: Self-pay

## 2020-11-27 ENCOUNTER — Other Ambulatory Visit: Payer: Self-pay | Admitting: *Deleted

## 2020-11-27 ENCOUNTER — Inpatient Hospital Stay: Payer: Medicare Other

## 2020-11-27 DIAGNOSIS — D469 Myelodysplastic syndrome, unspecified: Secondary | ICD-10-CM

## 2020-11-27 DIAGNOSIS — D696 Thrombocytopenia, unspecified: Secondary | ICD-10-CM

## 2020-11-27 DIAGNOSIS — D72829 Elevated white blood cell count, unspecified: Secondary | ICD-10-CM

## 2020-11-27 DIAGNOSIS — D649 Anemia, unspecified: Secondary | ICD-10-CM

## 2020-11-27 DIAGNOSIS — D4621 Refractory anemia with excess of blasts 1: Secondary | ICD-10-CM | POA: Diagnosis not present

## 2020-11-27 LAB — CBC
HCT: 23.3 % — ABNORMAL LOW (ref 39.0–52.0)
Hemoglobin: 7.4 g/dL — ABNORMAL LOW (ref 13.0–17.0)
MCH: 26.1 pg (ref 26.0–34.0)
MCHC: 31.8 g/dL (ref 30.0–36.0)
MCV: 82 fL (ref 80.0–100.0)
Platelets: 100 10*3/uL — ABNORMAL LOW (ref 150–400)
RBC: 2.84 MIL/uL — ABNORMAL LOW (ref 4.22–5.81)
RDW: 16.7 % — ABNORMAL HIGH (ref 11.5–15.5)
WBC: 125.2 10*3/uL (ref 4.0–10.5)
nRBC: 0.2 % (ref 0.0–0.2)

## 2020-11-27 LAB — SAMPLE TO BLOOD BANK

## 2020-11-27 LAB — PREPARE RBC (CROSSMATCH)

## 2020-11-27 NOTE — Telephone Encounter (Signed)
RN spoke with patient's wife.  Wife confirmed that pt is taking hydroxyurea 500 mg every other day *total of 5 days a week.  She was informed that patient will need a blood transfusion tomorrow. Dr. Loletha Grayer will see the patient prior to the blood transfusion. (Wife aware)   Contacted Michael in [facility] blood bank to inform that blood will be issued tomorrow. Also spoke with [facility] outpatient lab. At 1328-they are still waiting on the courier to pick up the lab specimen. Per Legrand Como he will continue to be on the look out for the lab specimen in [place].

## 2020-11-27 NOTE — Telephone Encounter (Signed)
PRBC orders in. Please confirm he is taking hydroxyurea. Increase hydroxyurea from every other day to 5 days/week. Please schedule 5-10 min clinic appt with me tomorrow when he is here for blood to discuss his progressive disease.

## 2020-11-27 NOTE — Progress Notes (Signed)
[facility]  [address] [place], [place] [address] Phone: [phone]  Fax: [phone]   Clinic Day:  11/28/20   Referring physician: Lequita Asal, MD  Chief Complaint: Paul Amedee. is a 87 y.o. 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  HPI: The patient was last seen in the hematology clinic on 11/06/2020. At that time, he felt weak and became easily fatigued. Hematocrit was 26.8, hemoglobin 8.4, MCV 85.1, platelets 102,000, WBC 67,400 with an ANC of 33,100. Creatinine was 1.25 (CrCl 54 ml/min). Uric acid was 7.1.  Labs followed: 11/13/2020: Hematocrit 24.4, hemoglobin 7.6, MCV 84.1, platelets 101,000, WBC   73,000. 11/20/2020: Hematocrit 25.4, hemoglobin 8.0, MCV 84.4, platelets 107,000, WBC 100,300. 11/27/2020: Hematocrit 23.3, hemoglobin 7.4, MCV 82.0, platelets 100,000, WBC 125,200.  He received 1 unit of PRBC on 11/14/2020 and 11/21/2020.  He began hydroxyurea every other day on 11/20/2020.  During the interim, he has been "weak." He is weaker in the morning. He started having diarrhea after he started hydroxyurea. He had diarrhea three times yesterday and twice already today. He has imodium at home but did not think to use it. He has not eaten any different foods or started any other medications. He denies fevers, chills, chest pain, palpitations, blood in the stool, and abdominal pain.  The patient states that he is DNR/DNI.   Past Medical History:  Diagnosis Date  . Anemia   . Arthritis   . BPH (benign prostatic hypertrophy)   . Cancer ([place])   . Complication of anesthesia    nausea  . Diabetes type 2, controlled ([place])   . HOH (hard of hearing)    r and L ears-70% loss per pt  . Hyperlipidemia   . Hypertension   . Myelodysplasia (myelodysplastic syndrome) ([place]) 2019    Past Surgical History:  Procedure Laterality Date  . APPENDECTOMY    .  COLONOSCOPY  09/21/08   1 polyp found, tubular adenoma  . HAND SURGERY    . KNEE SURGERY Left     Family History  Problem Relation Age of Onset  . Aneurysm Mother   . Heart disease Father   . Cancer Brother        skin  . Heart disease Brother   . Heart disease Brother   . Cancer Sister        skin  . Aneurysm Brother   . Heart disease Brother   . Heart disease Sister   . Heart disease Sister   . COPD Sister   . Diabetes Sister     Social History:  reports that he has never smoked. He has never used smokeless tobacco. He reports that he does not drink alcohol and does not use drugs. 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 Patient denies known exposures to radiation on toxins. 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 He had his 73-year marriage anniversary on 08/23/2018.He is walking 1-59mles per day.[place]. The patient is accompanied by his wife today.  Allergies: No Known Allergies  Current Medications: Current Outpatient Medications  Medication Sig Dispense Refill  . aspirin 81 MG chewable tablet Chew 81 mg by mouth daily.    . Calcium Carbonate-Vit D-Min (CALCIUM 1200 PO) Take 1,200 mg by mouth in the morning and at bedtime.    . Cranberry (THERACRAN PO)  Take 1 tablet by mouth daily.     . cyanocobalamin 1000 MCG tablet Take 1,000 mcg by mouth daily.    . finasteride (PROSCAR) 5 MG tablet Take 5 mg by mouth daily.    Marland Kitchen glucose blood test strip FreeStyle Lite Strips    . hydroxyurea (HYDREA) 500 MG capsule Take 1 capsule (500 mg total) by mouth every other day. May take with food to minimize GI side effects. 15 capsule 0  . LANCETS ULTRA FINE MISC Lancets,Ultra Thin 26 gauge    . lisinopril (PRINIVIL,ZESTRIL) 10 MG tablet Take 10 mg by mouth daily.    Marland Kitchen loperamide (IMODIUM) 2 MG capsule Take 1 capsule (2 mg total) by mouth See admin instructions. With onset of loose stool, take 68m followed by 257m every 2 hours until loose bowel movement stopped. Maximum: 16 mg/day 30 capsule 0  . meloxicam (MOBIC) 7.5 MG tablet Take 7.5 mg by mouth daily.    . metformin (FORTAMET) 1000 MG (OSM) 24 hr tablet Take 1,000 mg by mouth 2 (two) times daily with a meal.    . Multiple Vitamins-Minerals (CENTRUM SILVER PO) Take by mouth.    . Omega-3 Fatty Acids (FISH OIL PO) Take 1 tablet by mouth daily.     . Polyethylene Glycol 3350 (MIRALAX PO) Take by mouth as needed.     . Clarnce Flockalmetto-Phytosterols (PROSTATE SR PO) Take 1 tablet by mouth daily.     . simvastatin (ZOCOR) 40 MG tablet Take 40 mg by mouth daily.    . Marland Kitchenlimepiride (AMARYL) 1 MG tablet Take 1 tablet by mouth daily after breakfast.    . meclizine (ANTIVERT) 25 MG tablet Take 25 mg by mouth 2 (two) times daily as needed.  (Patient not taking: Reported on 11/28/2020)    . mupirocin ointment (BACTROBAN) 2 % Apply 1 application topically daily. With dressing changes (Patient not taking: No sig reported) 22 g 0  . ondansetron (ZOFRAN) 4 MG tablet Take 1 tablet (4 mg total) by mouth every 6 (six) hours as needed for nausea or vomiting. (Patient not taking: No sig reported) 30 tablet 2   No current facility-administered medications for this visit.   Facility-Administered Medications Ordered in Other Visits  Medication Dose Route Frequency Provider Last Rate Last Admin  . 0.9 %  sodium chloride infusion (Manually program via Guardrails IV Fluids)  250 mL Intravenous Once CoLequita AsalMD      . acetaminophen (TYLENOL) tablet 650 mg  650 mg Oral Once CoLequita AsalMD      . acetaminophen (TYLENOL) tablet 650 mg  650 mg Oral Once CoLequita AsalMD      . acetaminophen (TYLENOL) tablet 650 mg  650 mg Oral Once CoLequita AsalMD      . acetaminophen (TYLENOL) tablet 650 mg  650 mg Oral Once CoLequita AsalMD      . acetaminophen (TYLENOL) tablet 650 mg  650 mg Oral Once CoNolon Stalls, MD      . diphenhydrAMINE (BENADRYL)  capsule 25 mg  25 mg Oral Once CoNolon Stalls, MD      . diphenhydrAMINE (BENADRYL) capsule 25 mg  25 mg Oral Once CoNolon Stalls, MD      . diphenhydrAMINE (BENADRYL) capsule 25 mg  25 mg Oral Once CoNolon Stalls, MD      . diphenhydrAMINE (BENADRYL) capsule 25 mg  25 mg Oral Once CoLequita AsalMD      .  diphenhydrAMINE (BENADRYL) capsule 25 mg  25 mg Oral Once Lequita Asal, MD        Review of Systems  Constitutional: Positive for malaise/fatigue and weight loss (6 lbs). Negative for chills, diaphoresis and fever.  HENT: Positive for hearing loss. Negative for congestion, ear discharge, ear pain, nosebleeds, sinus pain, sore throat and tinnitus.   Eyes: Negative.  Negative for blurred vision.  Respiratory: Negative.  Negative for cough, hemoptysis, sputum production and shortness of breath.   Cardiovascular: Negative.  Negative for chest pain, palpitations and leg swelling.  Gastrointestinal: Negative.  Negative for abdominal pain, blood in stool, constipation, diarrhea, heartburn, melena, nausea and vomiting.       Eating well.  Genitourinary: Negative.  Negative for dysuria, frequency, hematuria and urgency.  Musculoskeletal: Negative.  Negative for back pain, joint pain, myalgias and neck pain.  Skin: Negative for itching and rash.  Neurological: Positive for weakness. Negative for dizziness, tingling, sensory change and headaches.  Endo/Heme/Allergies: Does not bruise/bleed easily.       Diabetes.  Psychiatric/Behavioral: Negative.  Negative for depression and memory loss. The patient is not nervous/anxious and does not have insomnia.   All other systems reviewed and are negative.  Performance status (ECOG): 1  Vitals Blood pressure (!) 134/47, pulse 88, temperature 98 F (36.7 C), temperature source Oral, resp. rate 16, weight 170 lb 3.1 oz (77.2 kg), SpO2 100 %.   Physical Exam Vitals and nursing note reviewed.  Constitutional:      General: He is  not in acute distress.    Appearance: He is well-developed. He is not diaphoretic.  HENT:     Head: Normocephalic and atraumatic.     Comments: Male pattern baldness.    Mouth/Throat:     Mouth: Mucous membranes are moist.     Pharynx: Oropharynx is clear. No oropharyngeal exudate.  Eyes:     General: No scleral icterus.    Extraocular Movements: Extraocular movements intact.     Conjunctiva/sclera: Conjunctivae normal.     Pupils: Pupils are equal, round, and reactive to light.     Comments: Gold rimmed glasses.  Blue eyes.   Cardiovascular:     Rate and Rhythm: Normal rate and regular rhythm.     Heart sounds: Normal heart sounds. No murmur heard.   Pulmonary:     Effort: Pulmonary effort is normal. No respiratory distress.     Breath sounds: Normal breath sounds. No wheezing or rales.  Chest:     Chest wall: No tenderness.  Abdominal:     General: Bowel sounds are normal. There is no distension.     Palpations: Abdomen is soft. There is no hepatomegaly, splenomegaly or mass.     Tenderness: There is no abdominal tenderness. There is no guarding or rebound.  Musculoskeletal:        General: No swelling or tenderness. Normal range of motion.     Cervical back: Normal range of motion and neck supple.  Skin:    General: Skin is warm and dry.  Neurological:     Mental Status: He is alert and oriented to person, place, and time.  Psychiatric:        Behavior: Behavior normal.        Thought Content: Thought content normal.        Judgment: Judgment normal.    Orders Only on 11/27/2020  Component Date Value Ref Range Status  . Order Confirmation 11/27/2020    Final  Value:ORDER PROCESSED BY BLOOD BANK Performed at [facility], [address] [place], [place] [address]   . ABO/RH(D) 11/27/2020 B POS   Final  . Antibody Screen 11/27/2020 NEG   Final  . Sample Expiration 11/27/2020 11/30/2020,2359   Final  . Unit Number 11/27/2020 [id number]    Final  . Blood Component Type 11/27/2020 RBC, LR IRR   Final  . Unit division 11/27/2020 00   Final  . Status of Unit 11/27/2020 IN-TRANSIT   Final  . Transfusion Status 11/27/2020 OK TO TRANSFUSE   Final  . Crossmatch Result 11/27/2020    Final                   Value:Compatible Performed at [facility], [address] [place], [place] [address]   . Blood Product Unit Number 11/27/2020 [id number]   Final  . PRODUCT CODE 11/27/2020 [id number]   Final  . Unit Type and Rh 11/27/2020 7300   Final  . Blood Product Expiration Date 11/27/2020 [id number]   Final  Appointment on 11/27/2020  Component Date Value Ref Range Status  . Blood Bank Specimen 11/27/2020 SAMPLE AVAILABLE FOR TESTING   Final  . Sample Expiration 11/27/2020    Final                   Value:11/30/2020,2359 Performed at [facility], [address], [place], [place] [address]   . WBC 11/27/2020 125.2* 4.0 - 10.5 K/uL Final   This critical result has verified and been called to [facility] by Gennaro Africa on 03 30 2022 at 1100, and has been read back.   Marland Kitchen RBC 11/27/2020 2.84* 4.22 - 5.81 MIL/uL Final  . Hemoglobin 11/27/2020 7.4* 13.0 - 17.0 g/dL Final  . HCT 11/27/2020 23.3* 39.0 - 52.0 % Final  . MCV 11/27/2020 82.0  80.0 - 100.0 fL Final  . MCH 11/27/2020 26.1  26.0 - 34.0 pg Final  . MCHC 11/27/2020 31.8  30.0 - 36.0 g/dL Final  . RDW 11/27/2020 16.7* 11.5 - 15.5 % Final  . Platelets 11/27/2020 100* 150 - 400 K/uL Final   Comment: Immature Platelet Fraction may be clinically indicated, consider ordering this additional test [id number]   . nRBC 11/27/2020 0.2  0.0 - 0.2 % Final   Performed at [facility], [address] [place], [place] [address]    Assessment:  Paul Stavola. is a 87 y.o. 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 Bone marrowon 09/22/2017 revealed a hypercellular for age with dyspoietic changes variably involving myeloid cell lines, but with main involvement of the  granulocytic/monocytic cell line. This was associated with bone marrow monocytosis and borderline number to slight increase in blastic cells. The overall changes favor a primary myeloid neoplasm, particularly a myelodysplastic syndrome especially refractory anemia with excess blasts (RAEB-1) or possibly refractory cytopenia with multilineage dysplasia. Consideration was also given to an evolving myelodysplastic/myeloproliferative neoplasm such as chronic myelomonocytic leukemia but the lack of absolute peripheral monocytosis precluded such a diagnosis at this time. Flow cytometrywas negative. Cytogeneticswere normal (46, XY). Foundation one was positive for ASXL1 O712RF*75, EZH2 Splice site 883-2P>Q, RUNX1 [id number]. IPSS-R4.5, intermediate group.  Anemia work-up on 08/17/2017: Vitamin B12 (658), folate (22.0), and TSH (2.688). Retic was 2.6%. Haptoglobin was 116 on 08/19/2017. Epo levelwas 104.8 on 10/05/2017.  Ferritin has been followed: 106 on 08/17/2017, 131 on 11/04/2017, 144 on 05/30/2018, 394 on 09/01/2018, 441 on 11/28/2018, 394 on 01/02/2019, 480 on 02/06/2019, 392 on 03/13/2019, 527  on 04/17/2019, 533 on 05/29/2019, 476 on 06/05/2019, 517 on 07/17/2019, 685 on 08/28/2019, 724 on 10/16/2019, 749 on 01/08/2020, 95 on 02/08/2020, 811 on 03/07/2020, 1189 on 04/17/2020, 1203 on 05/22/2020, 1483 on 06/19/2020, 1288 on 07/03/2020, and 1627 on 08/05/2020. Iron saturation was 34% on 08/17/2017 and 89% on 09/01/2018.  Bone marrowon 09/22/2017 revealed a hypercellular for age with dyspoietic changes variably involving myeloid cell lines, but with main involvement of the granulocytic/monocytic cell line. This was associated with bone marrow monocytosis and borderline number to slight increase in blasts (5%). The overall changes favor a primary myeloid neoplasm, particularly a myelodysplastic syndrome especially refractory anemia with excess blasts (RAEB-1) or possibly refractory cytopenia with  multilineage dysplasia. Consideration was also given to an evolving myelodysplastic/myeloproliferative neoplasm such as chronic myelomonocytic leukemia but the lack of absolute peripheral monocytosis precluded such a diagnosis at this time. Flow cytometrywas negative. Cytogeneticswere normal (46, XY). Foundation One was positive for ASXL1 H086VH*84, EZH2 Splice site 696-2X>B, RUNX1 [id number].  He has received20cycles ofVidaza(11/04/2017 - 08/22/2018; 10/24/2018-10/23/2019). He has received Aranesp(initially 150 mcg every 2 weeks post chemotherapy then 300 mcg every 1-2 weeks after cycle #3; last 09/19/2018). He last received Aranesp on02/15/2021. He has received 1-2units of PRBCswith each cycle.He has received62 units of PRBCs since 08/17/2017 (last 11/21/2020).  He began every other day hydroxyurea on 11/20/2020.  Bone marrowon 08/12/2018 revealed a hypercellular marrow with dyspoietic changes. There was a borderline number of blasts (5%) seen by morphology. The findings were similar to previous biopsy although the cellularity is slightly higher in the current material.consistent with previously known myelodysplastic syndrome. Flow cytometry was negative. Cytogenetics were normal (46, XY).  Bone marrowon 11/27/2019 revealed a hypercellular bone marrow (60-80%) with myelodysplastic syndrome. The morphologic features were similar to the previous biopsy. There was no definite increase in blasts. Flow cytometry revealed no significant CD34-positive blastic population, increase in monocytic cells (up to 18%), Tcells with nonspecific phenotypic changes, and no monoclonal B-cell population identified.Cytogenetics were normal (46, XY). FISH was negative. Next-Gen myeloid disorder profilerevealsASXL1G665f*15,EZH2splice site 1284-1L>K,GMWNUUsite cV.253-6UYQ splice site [id number].[id number],[id number]*06 There were no abnormalities in FLT3, IDH1, IDH2, NPM1.  [facility] a hypercellular bone marrow (80%) with persistent morphologic dyspoiesis and 9% blasts/promonocytes by manual differential.  Flow cytometry on 10/23/2020 revealed absolute aberrant monocytosis, left shifted neutrophils and 4% circulating myeloblasts.  Results were c/w a myeloproliferative process or a myeloproliferative/myelodysplastic process   [place] the [place] vaccine[place]01[place]2020 and 09/27/2019. CODE STATUS is DNR/DNI.  Symptomatically,  he feels "weak." He started having diarrhea after he started hydroxyurea. He denies fevers, chills, chest pain, palpitations, blood in the stool, and abdominal pain.  Exam reveals no hepatosplenomegaly.  Plan: 1.   Review labs from 11/27/2020. 2. Refractory anemia with excess blasts (RAEB-1) Clinically, he continues to decline in health.  Exam reveals no adenopathy or hepatosplenomegaly.  WBC is increasing by 25,000 a week.  Helast received Vidazaon 10/23/2019.  He began hydroxyurea every other day on 11/20/2020.   He notes diarrhea secondary to hydroxyurea Treatmentgoal was initially to decrease transfusion dependence and prevent acceleration of disease (blasts). [facility] was hypercellular (80%) with persistent morphologic dyspoiesis and 9% blasts/promonocytes.  Flow cytometry on 10/23/2020 revealed absolute aberrant monocytosis, left shifted neutrophils and 4% circulating myeloblasts.   He remains on supportive care (transfusion support alone).   He has received 62 units of PRBCs since initial diagnosis.  Hematocrit 23.3.  Hemoglobin 7.4.  MCV 82.0.  Platelets 100,000.  WBC 125,200 on 11/27/2020.  He denies any bleeding.   PRBC transfusion today secondary to symptomatology.  Discuss patient's thoughts about continuation of hydroxyurea.   WBC goal is < 50,000.   Discuss symptoms of leukostasis.  Continue supportive  care. 3. Leukocytosis and thrombocytopenia WBC has been increasing by 25,000 a week.   WBC: 73,000 to 100,300 to 125,200.  Hydroxyurea 500 mg QOD began on 11/20/2020.  Discuss increasing hydroxyurea to 500 mg a day if tolerated based on diarrhea. 4.   Diarrhea  Hydroxyurea has a low risk of diarrhea.  Send stools studies. 5.   Code status  CODE STATUS reviewed with patient and his wife.  CODE STATUS is DNR/DNI.   6.   PRBC transfusion today. 7.   RTC as previously scheduled.  I discussed the assessment and treatment plan with the patient.  The patient was provided an opportunity to ask questions and all were answered.  The patient agreed with the plan and demonstrated an understanding of the instructions.  The patient was advised to call back if the symptoms worsen or if the condition fails to improve as anticipated.   Lequita Asal, MD, PhD    11/28/2020 , 9:27 AM  I, Mirian Mo Tufford, am acting as a Education administrator for [employer]. Mike Gip, MD.   I, Vanesa Renier C. Mike Gip, MD, have reviewed the above documentation for accuracy and completeness, and I agree with the above.

## 2020-11-28 ENCOUNTER — Inpatient Hospital Stay (HOSPITAL_BASED_OUTPATIENT_CLINIC_OR_DEPARTMENT_OTHER): Payer: Medicare Other | Admitting: Hematology and Oncology

## 2020-11-28 ENCOUNTER — Inpatient Hospital Stay: Payer: Medicare Other

## 2020-11-28 VITALS — BP 134/47 | HR 88 | Temp 98.0°F | Resp 16 | Wt 170.2 lb

## 2020-11-28 DIAGNOSIS — D4621 Refractory anemia with excess of blasts 1: Secondary | ICD-10-CM | POA: Diagnosis not present

## 2020-11-28 DIAGNOSIS — R197 Diarrhea, unspecified: Secondary | ICD-10-CM

## 2020-11-28 DIAGNOSIS — D469 Myelodysplastic syndrome, unspecified: Secondary | ICD-10-CM

## 2020-11-28 DIAGNOSIS — Z7189 Other specified counseling: Secondary | ICD-10-CM

## 2020-11-28 DIAGNOSIS — D649 Anemia, unspecified: Secondary | ICD-10-CM

## 2020-11-28 DIAGNOSIS — D72829 Elevated white blood cell count, unspecified: Secondary | ICD-10-CM

## 2020-11-28 DIAGNOSIS — D696 Thrombocytopenia, unspecified: Secondary | ICD-10-CM | POA: Diagnosis not present

## 2020-11-28 MED ORDER — DIPHENHYDRAMINE HCL 25 MG PO CAPS: 25.0000 mg | ORAL_CAPSULE | Freq: Once | ORAL | Status: AC

## 2020-11-28 MED ORDER — SODIUM CHLORIDE 0.9% IV SOLUTION
250.0000 mL | Freq: Once | INTRAVENOUS | Status: AC
  Administered 2020-11-28: 250 mL via INTRAVENOUS
  Filled 2020-11-28: qty 250

## 2020-11-28 MED ORDER — ACETAMINOPHEN 325 MG PO TABS: 650.0000 mg | ORAL_TABLET | Freq: Once | ORAL | Status: AC

## 2020-11-29 ENCOUNTER — Other Ambulatory Visit: Payer: Self-pay

## 2020-11-29 DIAGNOSIS — D469 Myelodysplastic syndrome, unspecified: Secondary | ICD-10-CM

## 2020-11-29 LAB — TYPE AND SCREEN
ABO/RH(D): B POS
Antibody Screen: NEGATIVE
Unit division: 0

## 2020-11-29 LAB — BPAM RBC
Blood Product Expiration Date: 202204262359
ISSUE DATE / TIME: 202203310948
Unit Type and Rh: 7300

## 2020-12-02 ENCOUNTER — Other Ambulatory Visit: Payer: Self-pay

## 2020-12-02 DIAGNOSIS — R197 Diarrhea, unspecified: Secondary | ICD-10-CM

## 2020-12-02 LAB — GASTROINTESTINAL PANEL BY PCR, STOOL (REPLACES STOOL CULTURE)

## 2020-12-02 LAB — C DIFFICILE QUICK SCREEN W PCR REFLEX
C Diff antigen: NEGATIVE
C Diff interpretation: NOT DETECTED
C Diff toxin: NEGATIVE

## 2020-12-04 ENCOUNTER — Ambulatory Visit: Payer: TRICARE For Life (TFL) | Admitting: Hematology and Oncology

## 2020-12-04 ENCOUNTER — Other Ambulatory Visit: Payer: Self-pay | Admitting: Hematology and Oncology

## 2020-12-04 ENCOUNTER — Inpatient Hospital Stay: Payer: Medicare Other | Attending: Hematology and Oncology

## 2020-12-04 ENCOUNTER — Other Ambulatory Visit: Payer: Self-pay

## 2020-12-04 ENCOUNTER — Telehealth: Payer: Self-pay

## 2020-12-04 DIAGNOSIS — D72829 Elevated white blood cell count, unspecified: Secondary | ICD-10-CM | POA: Diagnosis not present

## 2020-12-04 DIAGNOSIS — D4621 Refractory anemia with excess of blasts 1: Secondary | ICD-10-CM | POA: Diagnosis not present

## 2020-12-04 DIAGNOSIS — D649 Anemia, unspecified: Secondary | ICD-10-CM

## 2020-12-04 DIAGNOSIS — E790 Hyperuricemia without signs of inflammatory arthritis and tophaceous disease: Secondary | ICD-10-CM | POA: Insufficient documentation

## 2020-12-04 DIAGNOSIS — D469 Myelodysplastic syndrome, unspecified: Secondary | ICD-10-CM

## 2020-12-04 DIAGNOSIS — R197 Diarrhea, unspecified: Secondary | ICD-10-CM | POA: Diagnosis not present

## 2020-12-04 DIAGNOSIS — D696 Thrombocytopenia, unspecified: Secondary | ICD-10-CM | POA: Insufficient documentation

## 2020-12-04 LAB — CBC WITH DIFFERENTIAL/PLATELET
Abs Immature Granulocytes: 6.3 10*3/uL — ABNORMAL HIGH (ref 0.00–0.07)
Band Neutrophils: 13 %
Basophils Absolute: 0 10*3/uL (ref 0.0–0.1)
Basophils Relative: 0 %
Eosinophils Absolute: 2.1 10*3/uL — ABNORMAL HIGH (ref 0.0–0.5)
Eosinophils Relative: 2 %
HCT: 22.7 % — ABNORMAL LOW (ref 39.0–52.0)
Hemoglobin: 7.3 g/dL — ABNORMAL LOW (ref 13.0–17.0)
Lymphocytes Relative: 9 %
Lymphs Abs: 9.5 10*3/uL — ABNORMAL HIGH (ref 0.7–4.0)
MCH: 26.4 pg (ref 26.0–34.0)
MCHC: 32.2 g/dL (ref 30.0–36.0)
MCV: 81.9 fL (ref 80.0–100.0)
Monocytes Absolute: 10.6 10*3/uL — ABNORMAL HIGH (ref 0.1–1.0)
Monocytes Relative: 10 %
Myelocytes: 4 %
Neutro Abs: 74.9 10*3/uL — ABNORMAL HIGH (ref 1.7–7.7)
Neutrophils Relative %: 58 %
Other: 2 %
Platelets: 97 10*3/uL — ABNORMAL LOW (ref 150–400)
Promyelocytes Relative: 2 %
RBC: 2.77 MIL/uL — ABNORMAL LOW (ref 4.22–5.81)
RDW: 16.7 % — ABNORMAL HIGH (ref 11.5–15.5)
WBC: 105.5 10*3/uL (ref 4.0–10.5)
nRBC: 0.4 % — ABNORMAL HIGH (ref 0.0–0.2)
nRBC: 2 /100 WBC — ABNORMAL HIGH

## 2020-12-04 LAB — SAMPLE TO BLOOD BANK

## 2020-12-04 LAB — PREPARE RBC (CROSSMATCH)

## 2020-12-04 NOTE — Progress Notes (Signed)
[facility]  [address] [place], [place] [address] Phone: [phone]  Fax: [phone]   Clinic Day:  12/05/20   Referring physician: Lequita Asal, MD  Chief Complaint: Paul Deviney. is a 87 y.o. 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  HPI: The patient was last seen in the hematology clinic on 11/28/2020. At that time, he felt "weak." He noted diarrhea after he starting hydroxyurea. He denied fevers, chills, chest pain, palpitations, blood in the stool, and abdominal pain.  Exam revealed no hepatosplenomegaly. Hematocrit was 23.3, hemoglobin 7.4, MCV 82.0, platelets 100,000, WBC 125,200. He received 1 unit of PRBCs. GI panel and C. diff were negative.  We discussed management of diarrhea.  We discussed increasing hydroxyurea from 500 mg QOD to 500 mg a day.   CBC on 12/04/2020 revealed a hematocrit of 22.7, hemoglobin 7.3, MCV 81.9, platelets 97,000, and WBC 105,500.  During the interim, he has had no energy for the past 2 days. Activities such as getting dressed wear him out very easily. He spends his time in his recliner. He still has diarrhea and has not taken imodium. He has not been taking hydroxyurea everyday.   Past Medical History:  Diagnosis Date  . Anemia   . Arthritis   . BPH (benign prostatic hypertrophy)   . Cancer ([place])   . Complication of anesthesia    nausea  . Diabetes type 2, controlled ([place])   . HOH (hard of hearing)    r and L ears-70% loss per pt  . Hyperlipidemia   . Hypertension   . Myelodysplasia (myelodysplastic syndrome) ([place]) 2019    Past Surgical History:  Procedure Laterality Date  . APPENDECTOMY    . COLONOSCOPY  09/21/08   1 polyp found, tubular adenoma  . HAND SURGERY    . KNEE SURGERY Left     Family History  Problem Relation Age of Onset  . Aneurysm Mother   . Heart disease Father   . Cancer Brother        skin  . Heart  disease Brother   . Heart disease Brother   . Cancer Sister        skin  . Aneurysm Brother   . Heart disease Brother   . Heart disease Sister   . Heart disease Sister   . COPD Sister   . Diabetes Sister     Social History:  reports that he has never smoked. He has never used smokeless tobacco. He reports that he does not drink alcohol and does not use drugs. 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 Patient denies known exposures to radiation on toxins. 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 He had his 8-year marriage anniversary on 08/23/2018.He is walking 1-60mles per day.[place]. The patient is accompanied by his wife today.  Allergies: No Known Allergies  Current Medications: Current Outpatient Medications  Medication Sig Dispense Refill  . aspirin 81 MG chewable tablet Chew 81 mg by mouth daily.    . Calcium Carbonate-Vit D-Min (CALCIUM 1200 PO) Take 1,200 mg by mouth in the morning and at bedtime.    . Cranberry (THERACRAN PO) Take 1 tablet by mouth daily.     . cyanocobalamin 1000 MCG tablet Take 1,000 mcg by mouth daily.    . finasteride (PROSCAR) 5 MG tablet Take 5 mg by mouth  daily.    . glimepiride (AMARYL) 1 MG tablet Take 1 tablet by mouth daily after breakfast.    . glucose blood test strip FreeStyle Lite Strips    . hydroxyurea (HYDREA) 500 MG capsule Take 1 capsule (500 mg total) by mouth every other day. May take with food to minimize GI side effects. 15 capsule 0  . LANCETS ULTRA FINE MISC Lancets,Ultra Thin 26 gauge    . lisinopril (PRINIVIL,ZESTRIL) 10 MG tablet Take 10 mg by mouth daily.    Marland Kitchen loperamide (IMODIUM) 2 MG capsule Take 1 capsule (2 mg total) by mouth See admin instructions. With onset of loose stool, take 67m followed by 221mevery 2 hours until loose bowel movement stopped. Maximum: 16 mg/day 30 capsule 0  . meloxicam (MOBIC) 7.5 MG tablet Take 7.5 mg by mouth daily.    .  metformin (FORTAMET) 1000 MG (OSM) 24 hr tablet Take 1,000 mg by mouth 2 (two) times daily with a meal.    . Multiple Vitamins-Minerals (CENTRUM SILVER PO) Take by mouth.    . Omega-3 Fatty Acids (FISH OIL PO) Take 1 tablet by mouth daily.     . Polyethylene Glycol 3350 (MIRALAX PO) Take by mouth as needed.     . Clarnce Flockalmetto-Phytosterols (PROSTATE SR PO) Take 1 tablet by mouth daily.     . simvastatin (ZOCOR) 40 MG tablet Take 40 mg by mouth daily.    . meclizine (ANTIVERT) 25 MG tablet Take 25 mg by mouth 2 (two) times daily as needed.  (Patient not taking: No sig reported)    . mupirocin ointment (BACTROBAN) 2 % Apply 1 application topically daily. With dressing changes (Patient not taking: No sig reported) 22 g 0  . ondansetron (ZOFRAN) 4 MG tablet Take 1 tablet (4 mg total) by mouth every 6 (six) hours as needed for nausea or vomiting. (Patient not taking: No sig reported) 30 tablet 2   No current facility-administered medications for this visit.   Facility-Administered Medications Ordered in Other Visits  Medication Dose Route Frequency Provider Last Rate Last Admin  . acetaminophen (TYLENOL) tablet 650 mg  650 mg Oral Once CoLequita AsalMD      . acetaminophen (TYLENOL) tablet 650 mg  650 mg Oral Once CoLequita AsalMD      . acetaminophen (TYLENOL) tablet 650 mg  650 mg Oral Once CoLequita AsalMD      . acetaminophen (TYLENOL) tablet 650 mg  650 mg Oral Once CoLequita AsalMD      . acetaminophen (TYLENOL) tablet 650 mg  650 mg Oral Once CoLequita AsalMD      . diphenhydrAMINE (BENADRYL) capsule 25 mg  25 mg Oral Once CoLequita AsalMD      . diphenhydrAMINE (BENADRYL) capsule 25 mg  25 mg Oral Once CoNolon Stalls, MD      . diphenhydrAMINE (BENADRYL) capsule 25 mg  25 mg Oral Once CoNolon Stalls, MD      . diphenhydrAMINE (BENADRYL) capsule 25 mg  25 mg Oral Once CoNolon Stalls, MD      . diphenhydrAMINE (BENADRYL) capsule 25 mg   25 mg Oral Once CoLequita AsalMD        Review of Systems  Constitutional: Positive for malaise/fatigue and weight loss (up 4 lbs). Negative for chills, diaphoresis and fever.       "Not doing well".  HENT: Positive for hearing loss. Negative for congestion, ear  discharge, ear pain, nosebleeds, sinus pain, sore throat and tinnitus.   Eyes: Negative.  Negative for blurred vision.  Respiratory: Negative.  Negative for cough, hemoptysis, sputum production and shortness of breath.   Cardiovascular: Negative.  Negative for chest pain, palpitations and leg swelling.  Gastrointestinal: Negative.  Negative for abdominal pain, blood in stool, constipation, diarrhea, heartburn, melena, nausea and vomiting.       Eating well.  Genitourinary: Negative.  Negative for dysuria, frequency, hematuria and urgency.  Musculoskeletal: Negative.  Negative for back pain, joint pain, myalgias and neck pain.  Skin: Negative for itching and rash.  Neurological: Positive for weakness. Negative for dizziness, tingling, sensory change and headaches.  Endo/Heme/Allergies: Does not bruise/bleed easily.       Diabetes.  Psychiatric/Behavioral: Negative.  Negative for depression and memory loss. The patient is not nervous/anxious and does not have insomnia.   All other systems reviewed and are negative.  Performance status (ECOG): 2  Vitals Blood pressure (!) 142/51, pulse 84, temperature (!) 96.7 F (35.9 C), resp. rate 18, weight 174 lb 2.6 oz (79 kg).   Physical Exam Vitals and nursing note reviewed.  Constitutional:      General: He is not in acute distress.    Appearance: He is well-developed. He is not diaphoretic.  HENT:     Head:     Comments: Male pattern baldness. Eyes:     General: No scleral icterus.    Conjunctiva/sclera: Conjunctivae normal.     Comments: Gold rimmed glasses.  Blue eyes.   Cardiovascular:     Rate and Rhythm: Normal rate and regular rhythm.     Heart sounds: Normal heart  sounds. No murmur heard.   Pulmonary:     Effort: Pulmonary effort is normal. No respiratory distress.     Breath sounds: Normal breath sounds. No wheezing or rales.  Neurological:     Mental Status: He is alert and oriented to person, place, and time.  Psychiatric:        Behavior: Behavior normal.        Thought Content: Thought content normal.        Judgment: Judgment normal.    Orders Only on 12/04/2020  Component Date Value Ref Range Status  . ABO/RH(D) 12/04/2020 B POS   Final  . Antibody Screen 12/04/2020 NEG   Final  . Sample Expiration 12/04/2020 12/07/2020,2359   Final  . Unit Number 12/04/2020    Final                   [id number] Performed at [facility], [address] [place], [place] [address]   . Blood Component Type 12/04/2020    Final                   Value:RBC, LR IRR Performed at [facility], [address] [place], [place] [address]   . Unit division 12/04/2020    Final                   Value:00 Performed at [facility], [address] [place], [place] [address]   . Status of Unit 12/04/2020    Final                   Value:ALLOCATED Performed at [facility], [address] [place], [place] [address]   . Transfusion Status 12/04/2020 OK TO TRANSFUSE   Final  . Crossmatch Result 12/04/2020  Final                   Value:Compatible Performed at [facility], [address] [place], [place] [address]   . Order Confirmation 12/04/2020    Final                   Value:ORDER PROCESSED BY BLOOD BANK Performed at [facility], [address] [place], [place] [address]   . Blood Product Unit Number 12/04/2020 [id number]   Final  . PRODUCT CODE 12/04/2020 [id number]   Final  . Unit Type and Rh 12/04/2020 7300   Final  . Blood Product Expiration Date 12/04/2020 [id number]   Final  Appointment on 12/04/2020  Component Date Value Ref Range Status  . Blood Bank Specimen  12/04/2020 SAMPLE AVAILABLE FOR TESTING   Final  . Sample Expiration 12/04/2020    Final                   Value:12/07/2020,2359 Performed at [facility], [address], [place], [place] [address]   . WBC 12/04/2020 105.5* 4.0 - 10.5 K/uL Final   This critical result has verified and been called to [facility] by Memory Argue on 04 06 2022 at 0941, and has been read back.   Marland Kitchen RBC 12/04/2020 2.77* 4.22 - 5.81 MIL/uL Final  . Hemoglobin 12/04/2020 7.3* 13.0 - 17.0 g/dL Final  . HCT 12/04/2020 22.7* 39.0 - 52.0 % Final  . MCV 12/04/2020 81.9  80.0 - 100.0 fL Final  . MCH 12/04/2020 26.4  26.0 - 34.0 pg Final  . MCHC 12/04/2020 32.2  30.0 - 36.0 g/dL Final  . RDW 12/04/2020 16.7* 11.5 - 15.5 % Final  . Platelets 12/04/2020 97* 150 - 400 K/uL Final   Comment: Immature Platelet Fraction may be clinically indicated, consider ordering this additional test [id number]   . nRBC 12/04/2020 0.4* 0.0 - 0.2 % Final  . Neutrophils Relative % 12/04/2020 58  % Final  . Neutro Abs 12/04/2020 74.9* 1.7 - 7.7 K/uL Final  . Band Neutrophils 12/04/2020 13  % Final  . Lymphocytes Relative 12/04/2020 9  % Final  . Lymphs Abs 12/04/2020 9.5* 0.7 - 4.0 K/uL Final  . Monocytes Relative 12/04/2020 10  % Final  . Monocytes Absolute 12/04/2020 10.6* 0.1 - 1.0 K/uL Final  . Eosinophils Relative 12/04/2020 2  % Final  . Eosinophils Absolute 12/04/2020 2.1* 0.0 - 0.5 K/uL Final  . Basophils Relative 12/04/2020 0  % Final  . Basophils Absolute 12/04/2020 0.0  0.0 - 0.1 K/uL Final  . RBC Morphology 12/04/2020 MIXED RBC   Final  . Other 12/04/2020 2  % Final  . nRBC 12/04/2020 2* 0 /100 WBC Final  . Myelocytes 12/04/2020 4  % Final  . Promyelocytes Relative 12/04/2020 2  % Final  . Abs Immature Granulocytes 12/04/2020 6.30* 0.00 - 0.07 K/uL Final  . Polychromasia 12/04/2020 PRESENT   Final   Performed at [facility], [address] [place], [place] [address]    Assessment:  Evaristo Tsuda. is a 87 y.o. 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 Bone marrowon 09/22/2017 revealed a hypercellular for age with dyspoietic changes variably involving myeloid cell lines, but with main involvement of the granulocytic/monocytic cell line. This was associated with bone marrow monocytosis and borderline number to slight increase in blastic cells. The overall changes favor a primary myeloid neoplasm, particularly a myelodysplastic syndrome especially refractory anemia with excess blasts (RAEB-1)  or possibly refractory cytopenia with multilineage dysplasia. Consideration was also given to an evolving myelodysplastic/myeloproliferative neoplasm such as chronic myelomonocytic leukemia but the lack of absolute peripheral monocytosis precluded such a diagnosis at this time. Flow cytometrywas negative. Cytogeneticswere normal (46, XY). Foundation one was positive for ASXL1 N191YO*06, EZH2 Splice site 004-5T>X, RUNX1 [id number]. IPSS-R4.5, intermediate group.  Anemia work-up on 08/17/2017: Vitamin B12 (658), folate (22.0), and TSH (2.688). Retic was 2.6%. Haptoglobin was 116 on 08/19/2017. Epo levelwas 104.8 on 10/05/2017.  Ferritin has been followed: 106 on 08/17/2017, 131 on 11/04/2017, 144 on 05/30/2018, 394 on 09/01/2018, 441 on 11/28/2018, 394 on 01/02/2019, 480 on 02/06/2019, 392 on 03/13/2019, 527 on 04/17/2019, 533 on 05/29/2019, 476 on 06/05/2019, 517 on 07/17/2019, 685 on 08/28/2019, 724 on 10/16/2019, 749 on 01/08/2020, 95 on 02/08/2020, 811 on 03/07/2020, 1189 on 04/17/2020, 1203 on 05/22/2020, 1483 on 06/19/2020, 1288 on 07/03/2020, and 1627 on 08/05/2020. Iron saturation was 34% on 08/17/2017 and 89% on 09/01/2018.  Bone marrowon 09/22/2017 revealed a hypercellular for age with dyspoietic changes variably involving myeloid cell lines, but with main involvement of the granulocytic/monocytic cell line. This was associated with bone marrow monocytosis and borderline number to slight increase in blasts (5%). The  overall changes favor a primary myeloid neoplasm, particularly a myelodysplastic syndrome especially refractory anemia with excess blasts (RAEB-1) or possibly refractory cytopenia with multilineage dysplasia. Consideration was also given to an evolving myelodysplastic/myeloproliferative neoplasm such as chronic myelomonocytic leukemia but the lack of absolute peripheral monocytosis precluded such a diagnosis at this time. Flow cytometrywas negative. Cytogeneticswere normal (46, XY). Foundation One was positive for ASXL1 H741SE*39, EZH2 Splice site 532-0E>B, RUNX1 [id number].  He has received20cycles ofVidaza(11/04/2017 - 08/22/2018; 10/24/2018-10/23/2019). He has received Aranesp(initially 150 mcg every 2 weeks post chemotherapy then 300 mcg every 1-2 weeks after cycle #3; last 09/19/2018). He last received Aranesp on02/15/2021. He has received 1-2units of PRBCswith each cycle.He has received63 units of PRBCs since 08/17/2017 (last 11/28/2020).  He began every other day hydroxyurea on 11/20/2020.  Bone marrowon 08/12/2018 revealed a hypercellular marrow with dyspoietic changes. There was a borderline number of blasts (5%) seen by morphology. The findings were similar to previous biopsy although the cellularity is slightly higher in the current material.consistent with previously known myelodysplastic syndrome. Flow cytometry was negative. Cytogenetics were normal (46, XY).  Bone marrowon 11/27/2019 revealed a hypercellular bone marrow (60-80%) with myelodysplastic syndrome. The morphologic features were similar to the previous biopsy. There was no definite increase in blasts. Flow cytometry revealed no significant CD34-positive blastic population, increase in monocytic cells (up to 18%), Tcells with nonspecific phenotypic changes, and no monoclonal B-cell population identified.Cytogenetics were normal (46, XY). FISH was negative. Next-Gen myeloid disorder  profilerevealsASXL1G646f*15,EZH2splice site 1343-5W>Y,SHUOHFsite cG.902-1JDB splice site cZ.208-0_223-3KPQAESLPN,PYYF1T021R*17 There were no abnormalities in FLT3, IDH1, IDH2, NPM1.  [facility] a hypercellular bone marrow (80%) with persistent morphologic dyspoiesis and 9% blasts/promonocytes by manual differential.  Flow cytometry on 10/23/2020 revealed absolute aberrant monocytosis, left shifted neutrophils and 4% circulating myeloblasts.  Results were c/w a myeloproliferative process or a myeloproliferative/myelodysplastic process   [place] and 09/27/2019. CODE STATUS is DNR/DNI.  Symptomatically, his health continues to decline.  He remains fatigued.  Exam is stable. WBC 105,500 on 12/04/2020.  Plan: 1.   Review labs from 12/04/2020 2. Refractory anemia with excess blasts (RAEB-1) Clinically, he continues to be fatigued with little energy.  Exam reveals no adenopathy or hepatosplenomegaly.  WBC was increasing by  25,000 a week and remains > 100,000.  Helast received Vidazaon 10/23/2019.  He began hydroxyurea every other day on 11/20/2020.   He describes diarrhea secondary to hydroxyurea. Treatmentgoal was initially to decrease transfusion dependence and prevent acceleration of disease (blasts). [facility] on 02/15/2020 was hypercellular (80%) with persistent morphologic dyspoiesis and 9% blasts/promonocytes.  Flow cytometry on 10/23/2020 revealed absolute aberrant monocytosis, left shifted neutrophils and 4% circulating myeloblasts.   He remains on supportive care (transfusion support alone).   He has received 63 units of PRBCs since initial diagnosis.  Hematocrit 22.7.  Hemoglobin 7.3.  MCV 81.9.  Platelets 97,000.  WBC 105,500 on 12/04/2020.  He denies any bleeding or issues with infection.   PRBC transfusion today secondary  to symptomatic anemia.   Review symptoms are also secondary to progressive disease.  Discuss patient's thoughts about continuation of hydroxyurea.   WBC goal is < 50,000.   Encourage continuation of hydroxyurea.   Review symptoms of leukostasis.  Patient wishes to continue supportive care. 3. Leukocytosis and thrombocytopenia WBC had been increasing by 25,000 a week.   WBC: 73,000 to 100,300 to 125,200 to 105,5000.  Hydroxyurea 500 mg QOD began on 11/20/2020.  Encourage hydroxyurea 500 mg a day if tolerated based on diarrhea. 4.   Diarrhea  Hydroxyurea has a low risk of diarrhea.  Stool studies for C difficile and GI panel by PCR were negative on 12/01/2020.  Continue Imodium as needed. 5.   Code status  CODE STATUS remains DNR/DNI.   6.   Transfuse 1 unit PRBCs today. 7.   RTC on 03/13 for labs (CBC with diff, CMP, uric acid, hold tube) and +/- PRBC transfusion on 11/11/2020. 8.   RTC weekly x 2 beginning 03/20 for labs (CBC with diff, hold tube) and +/- transfusion next day. 9.   RTC on 01/01/2021 for MD assessment, labs (CBC with diff, CMP, hold tube) and +/- transfusion next day.  I discussed the assessment and treatment plan with the patient.  The patient was provided an opportunity to ask questions and all were answered.  The patient agreed with the plan and demonstrated an understanding of the instructions.  The patient was advised to call back if the symptoms worsen or if the condition fails to improve as anticipated.  I provided 17 minutes of face-to-face time during this this encounter and > 50% was spent counseling as documented under my assessment and plan.  An additional 5 minutes were spent reviewing his chart (Epic and Care Everywhere) including notes, labs, and imaging studies.    Lequita Asal, MD, PhD    12/05/2020 , 9:40 AM  I, Mirian Mo Tufford, am acting as a Education administrator for [employer]. Mike Gip, MD.   I, Bernardino Dowell C. Mike Gip, MD, have reviewed the above  documentation for accuracy and completeness, and I agree with the above.

## 2020-12-04 NOTE — Telephone Encounter (Signed)
Pt agreeable to Blood transfusion tomorrow. He is scheduled for transfusion at 9:30am. Please enter blood orders. Thanks

## 2020-12-04 NOTE — Progress Notes (Signed)
Refer to previous note Informed MD of critical counts. Discussed with wife patient should be taking Hydroxyurea daily not every other day per Dr Mike Gip. Wife agrees and verbalizes understanding. Wife stated if she had any questions she will ask tomorrow at clinic( pt has an apt).

## 2020-12-04 NOTE — Progress Notes (Signed)
CRITICAL VALUE STICKER  CRITICAL VALUE: Lab called critical WBC 105.5 & ANC 49  DATE & TIME NOTIFIED:  12/04/20 @ 940a  MD NOTIFIED:  Mike Gip  TIME OF NOTIFICATION: 943am  RESPONSE: Still awaiting

## 2020-12-04 NOTE — Telephone Encounter (Signed)
-----   Message from Lequita Asal, MD sent at 12/04/2020 11:26 AM EDT ----- Regarding: Please call patient  Ask if he would like a transfusion tomorrow.  M  ----- Message ----- From: Buel Ream, Lab In [place] Sent: 12/04/2020   9:42 AM EDT To: Lequita Asal, MD

## 2020-12-05 ENCOUNTER — Encounter: Payer: Self-pay | Admitting: Hematology and Oncology

## 2020-12-05 ENCOUNTER — Inpatient Hospital Stay: Payer: Medicare Other

## 2020-12-05 ENCOUNTER — Inpatient Hospital Stay (HOSPITAL_BASED_OUTPATIENT_CLINIC_OR_DEPARTMENT_OTHER): Payer: Medicare Other | Admitting: Hematology and Oncology

## 2020-12-05 VITALS — BP 142/51 | HR 84 | Temp 96.7°F | Resp 18 | Wt 174.2 lb

## 2020-12-05 DIAGNOSIS — D649 Anemia, unspecified: Secondary | ICD-10-CM

## 2020-12-05 DIAGNOSIS — D4621 Refractory anemia with excess of blasts 1: Secondary | ICD-10-CM | POA: Diagnosis not present

## 2020-12-05 DIAGNOSIS — D469 Myelodysplastic syndrome, unspecified: Secondary | ICD-10-CM

## 2020-12-05 DIAGNOSIS — D72829 Elevated white blood cell count, unspecified: Secondary | ICD-10-CM | POA: Diagnosis not present

## 2020-12-05 DIAGNOSIS — D630 Anemia in neoplastic disease: Secondary | ICD-10-CM | POA: Diagnosis not present

## 2020-12-05 MED ORDER — DIPHENHYDRAMINE HCL 25 MG PO CAPS: 25.0000 mg | ORAL_CAPSULE | Freq: Once | ORAL | Status: DC

## 2020-12-05 MED ORDER — ACETAMINOPHEN 325 MG PO TABS: 650.0000 mg | ORAL_TABLET | Freq: Once | ORAL | Status: DC

## 2020-12-05 MED ORDER — SODIUM CHLORIDE 0.9% IV SOLUTION
250.0000 mL | Freq: Once | INTRAVENOUS | Status: AC
Start: 2020-12-05 — End: 2020-12-05
  Administered 2020-12-05: 250 mL via INTRAVENOUS
  Filled 2020-12-05: qty 250

## 2020-12-05 NOTE — Progress Notes (Signed)
Patient here for follow up. No new concerns voiced.  °

## 2020-12-06 LAB — TYPE AND SCREEN
ABO/RH(D): B POS
Antibody Screen: NEGATIVE
Unit division: 0

## 2020-12-06 LAB — BPAM RBC
Blood Product Expiration Date: 202205022359
ISSUE DATE / TIME: 202204070958
Unit Type and Rh: 7300

## 2020-12-09 ENCOUNTER — Other Ambulatory Visit: Payer: Self-pay | Admitting: Hematology and Oncology

## 2020-12-09 MED ORDER — HYDROXYUREA 500 MG PO CAPS: 500.0000 mg | ORAL_CAPSULE | Freq: Every day | ORAL | 0 refills | Status: AC

## 2020-12-10 ENCOUNTER — Other Ambulatory Visit: Payer: Self-pay

## 2020-12-10 DIAGNOSIS — D469 Myelodysplastic syndrome, unspecified: Secondary | ICD-10-CM

## 2020-12-11 ENCOUNTER — Other Ambulatory Visit: Payer: Self-pay | Admitting: Hematology and Oncology

## 2020-12-11 ENCOUNTER — Other Ambulatory Visit: Payer: Self-pay

## 2020-12-11 ENCOUNTER — Inpatient Hospital Stay: Payer: Medicare Other

## 2020-12-11 ENCOUNTER — Telehealth: Payer: Self-pay

## 2020-12-11 DIAGNOSIS — D469 Myelodysplastic syndrome, unspecified: Secondary | ICD-10-CM

## 2020-12-11 DIAGNOSIS — E790 Hyperuricemia without signs of inflammatory arthritis and tophaceous disease: Secondary | ICD-10-CM

## 2020-12-11 DIAGNOSIS — D4621 Refractory anemia with excess of blasts 1: Secondary | ICD-10-CM | POA: Diagnosis not present

## 2020-12-11 DIAGNOSIS — D649 Anemia, unspecified: Secondary | ICD-10-CM

## 2020-12-11 LAB — CBC WITH DIFFERENTIAL/PLATELET
Abs Immature Granulocytes: 11.9 10*3/uL — ABNORMAL HIGH (ref 0.00–0.07)
Basophils Absolute: 0 10*3/uL (ref 0.0–0.1)
Basophils Relative: 0 %
Eosinophils Absolute: 1 10*3/uL — ABNORMAL HIGH (ref 0.0–0.5)
Eosinophils Relative: 1 %
HCT: 22.7 % — ABNORMAL LOW (ref 39.0–52.0)
Hemoglobin: 7.3 g/dL — ABNORMAL LOW (ref 13.0–17.0)
Lymphocytes Relative: 12 %
Lymphs Abs: 11.9 10*3/uL — ABNORMAL HIGH (ref 0.7–4.0)
MCH: 26.5 pg (ref 26.0–34.0)
MCHC: 32.2 g/dL (ref 30.0–36.0)
MCV: 82.5 fL (ref 80.0–100.0)
Metamyelocytes Relative: 2 %
Monocytes Absolute: 19.8 10*3/uL — ABNORMAL HIGH (ref 0.1–1.0)
Monocytes Relative: 20 %
Myelocytes: 6 %
Neutro Abs: 50.5 10*3/uL — ABNORMAL HIGH (ref 1.7–7.7)
Neutrophils Relative %: 51 %
Other: 4 %
Platelets: 87 10*3/uL — ABNORMAL LOW (ref 150–400)
Promyelocytes Relative: 4 %
RBC Morphology: NONE SEEN
RBC: 2.75 MIL/uL — ABNORMAL LOW (ref 4.22–5.81)
RDW: 17.1 % — ABNORMAL HIGH (ref 11.5–15.5)
WBC: 99 10*3/uL (ref 4.0–10.5)
nRBC: 0.3 % — ABNORMAL HIGH (ref 0.0–0.2)
nRBC: 2 /100 WBC — ABNORMAL HIGH

## 2020-12-11 LAB — COMPREHENSIVE METABOLIC PANEL
ALT: 35 U/L (ref 0–44)
AST: 39 U/L (ref 15–41)
Albumin: 3.6 g/dL (ref 3.5–5.0)
Alkaline Phosphatase: 45 U/L (ref 38–126)
Anion gap: 8 (ref 5–15)
BUN: 24 mg/dL — ABNORMAL HIGH (ref 8–23)
CO2: 24 mmol/L (ref 22–32)
Calcium: 8.9 mg/dL (ref 8.9–10.3)
Chloride: 101 mmol/L (ref 98–111)
Creatinine, Ser: 1.49 mg/dL — ABNORMAL HIGH (ref 0.61–1.24)
GFR, Estimated: 44 mL/min — ABNORMAL LOW (ref >60)
Glucose, Bld: 312 mg/dL — ABNORMAL HIGH (ref 70–99)
Potassium: 3.5 mmol/L (ref 3.5–5.1)
Sodium: 133 mmol/L — ABNORMAL LOW (ref 135–145)
Total Bilirubin: 0.8 mg/dL (ref 0.3–1.2)
Total Protein: 6.8 g/dL (ref 6.5–8.1)

## 2020-12-11 LAB — PATHOLOGIST SMEAR REVIEW

## 2020-12-11 LAB — PREPARE RBC (CROSSMATCH)

## 2020-12-11 LAB — URIC ACID: Uric Acid, Serum: 10.1 mg/dL — ABNORMAL HIGH (ref 3.7–8.6)

## 2020-12-11 MED ORDER — ALLOPURINOL 300 MG PO TABS: 150.0000 mg | ORAL_TABLET | Freq: Every day | ORAL | 0 refills | Status: DC

## 2020-12-11 NOTE — Telephone Encounter (Signed)
Per Dr. Mike Gip, patients uric acid level was elevated and she sent a prescription to the pharmacy for allopurinol. Pts wife is aware.

## 2020-12-12 ENCOUNTER — Inpatient Hospital Stay: Payer: Medicare Other

## 2020-12-12 DIAGNOSIS — D4621 Refractory anemia with excess of blasts 1: Secondary | ICD-10-CM | POA: Diagnosis not present

## 2020-12-12 DIAGNOSIS — D469 Myelodysplastic syndrome, unspecified: Secondary | ICD-10-CM

## 2020-12-12 DIAGNOSIS — D649 Anemia, unspecified: Secondary | ICD-10-CM

## 2020-12-12 LAB — SAMPLE TO BLOOD BANK

## 2020-12-12 MED ORDER — ACETAMINOPHEN 325 MG PO TABS: 650.0000 mg | ORAL_TABLET | Freq: Once | ORAL | Status: DC

## 2020-12-12 MED ORDER — DIPHENHYDRAMINE HCL 25 MG PO CAPS: 25.0000 mg | ORAL_CAPSULE | Freq: Once | ORAL | Status: DC

## 2020-12-12 MED ORDER — SODIUM CHLORIDE 0.9% IV SOLUTION
250.0000 mL | Freq: Once | INTRAVENOUS | Status: AC
  Administered 2020-12-12: 250 mL via INTRAVENOUS
  Filled 2020-12-12: qty 250

## 2020-12-13 LAB — TYPE AND SCREEN
ABO/RH(D): B POS
Antibody Screen: NEGATIVE
Unit division: 0

## 2020-12-13 LAB — BPAM RBC
Blood Product Expiration Date: 202205062359
ISSUE DATE / TIME: 202204141002
Unit Type and Rh: 7300

## 2020-12-18 ENCOUNTER — Other Ambulatory Visit: Payer: Self-pay | Admitting: Hematology and Oncology

## 2020-12-18 ENCOUNTER — Inpatient Hospital Stay: Payer: Medicare Other

## 2020-12-18 ENCOUNTER — Other Ambulatory Visit: Payer: Self-pay

## 2020-12-18 DIAGNOSIS — D4621 Refractory anemia with excess of blasts 1: Secondary | ICD-10-CM | POA: Diagnosis not present

## 2020-12-18 DIAGNOSIS — D649 Anemia, unspecified: Secondary | ICD-10-CM

## 2020-12-18 DIAGNOSIS — D469 Myelodysplastic syndrome, unspecified: Secondary | ICD-10-CM

## 2020-12-18 LAB — CBC WITH DIFFERENTIAL/PLATELET
Abs Immature Granulocytes: 30.95 10*3/uL — ABNORMAL HIGH (ref 0.00–0.07)
Basophils Absolute: 0.4 10*3/uL — ABNORMAL HIGH (ref 0.0–0.1)
Basophils Relative: 0 %
Eosinophils Absolute: 0.8 10*3/uL — ABNORMAL HIGH (ref 0.0–0.5)
Eosinophils Relative: 1 %
HCT: 22.8 % — ABNORMAL LOW (ref 39.0–52.0)
Hemoglobin: 7.1 g/dL — ABNORMAL LOW (ref 13.0–17.0)
Immature Granulocytes: 25 %
Lymphocytes Relative: 4 %
Lymphs Abs: 5.4 10*3/uL — ABNORMAL HIGH (ref 0.7–4.0)
MCH: 26 pg (ref 26.0–34.0)
MCHC: 31.1 g/dL (ref 30.0–36.0)
MCV: 83.5 fL (ref 80.0–100.0)
Monocytes Absolute: 29.5 10*3/uL — ABNORMAL HIGH (ref 0.1–1.0)
Monocytes Relative: 24 %
Neutro Abs: 56.1 10*3/uL — ABNORMAL HIGH (ref 1.7–7.7)
Neutrophils Relative %: 46 %
Platelets: 94 10*3/uL — ABNORMAL LOW (ref 150–400)
RBC: 2.73 MIL/uL — ABNORMAL LOW (ref 4.22–5.81)
RDW: 17.2 % — ABNORMAL HIGH (ref 11.5–15.5)
WBC: 123 10*3/uL (ref 4.0–10.5)
nRBC: 0.4 % — ABNORMAL HIGH (ref 0.0–0.2)

## 2020-12-18 LAB — PREPARE RBC (CROSSMATCH)

## 2020-12-19 ENCOUNTER — Inpatient Hospital Stay: Payer: Medicare Other

## 2020-12-19 DIAGNOSIS — D469 Myelodysplastic syndrome, unspecified: Secondary | ICD-10-CM

## 2020-12-19 DIAGNOSIS — D649 Anemia, unspecified: Secondary | ICD-10-CM

## 2020-12-19 DIAGNOSIS — D4621 Refractory anemia with excess of blasts 1: Secondary | ICD-10-CM | POA: Diagnosis not present

## 2020-12-19 MED ORDER — ACETAMINOPHEN 325 MG PO TABS: 650.0000 mg | ORAL_TABLET | Freq: Once | ORAL | Status: DC

## 2020-12-19 MED ORDER — SODIUM CHLORIDE 0.9% IV SOLUTION
250.0000 mL | Freq: Once | INTRAVENOUS | Status: AC
  Administered 2020-12-19: 250 mL via INTRAVENOUS
  Filled 2020-12-19: qty 250

## 2020-12-19 MED ORDER — DIPHENHYDRAMINE HCL 25 MG PO CAPS: 25.0000 mg | ORAL_CAPSULE | Freq: Once | ORAL | Status: DC

## 2020-12-20 LAB — TYPE AND SCREEN
ABO/RH(D): B POS
Antibody Screen: NEGATIVE
Unit division: 0

## 2020-12-20 LAB — BPAM RBC
Blood Product Expiration Date: 202205032359
ISSUE DATE / TIME: 202204210947
Unit Type and Rh: 1700

## 2020-12-24 ENCOUNTER — Other Ambulatory Visit: Payer: Self-pay

## 2020-12-24 DIAGNOSIS — D469 Myelodysplastic syndrome, unspecified: Secondary | ICD-10-CM

## 2020-12-24 DIAGNOSIS — D649 Anemia, unspecified: Secondary | ICD-10-CM

## 2020-12-25 ENCOUNTER — Inpatient Hospital Stay: Payer: Medicare Other

## 2020-12-25 ENCOUNTER — Other Ambulatory Visit: Payer: Self-pay | Admitting: Oncology

## 2020-12-25 ENCOUNTER — Other Ambulatory Visit: Payer: Self-pay

## 2020-12-25 ENCOUNTER — Other Ambulatory Visit: Payer: Self-pay | Admitting: *Deleted

## 2020-12-25 DIAGNOSIS — D469 Myelodysplastic syndrome, unspecified: Secondary | ICD-10-CM

## 2020-12-25 DIAGNOSIS — D649 Anemia, unspecified: Secondary | ICD-10-CM

## 2020-12-25 DIAGNOSIS — D4621 Refractory anemia with excess of blasts 1: Secondary | ICD-10-CM | POA: Diagnosis not present

## 2020-12-25 LAB — CBC WITH DIFFERENTIAL/PLATELET
Abs Immature Granulocytes: 34.76 10*3/uL — ABNORMAL HIGH (ref 0.00–0.07)
Basophils Absolute: 0.6 10*3/uL — ABNORMAL HIGH (ref 0.0–0.1)
Basophils Relative: 0 %
Eosinophils Absolute: 0.9 10*3/uL — ABNORMAL HIGH (ref 0.0–0.5)
Eosinophils Relative: 1 %
HCT: 23.9 % — ABNORMAL LOW (ref 39.0–52.0)
Hemoglobin: 7.5 g/dL — ABNORMAL LOW (ref 13.0–17.0)
Immature Granulocytes: 23 %
Lymphocytes Relative: 4 %
Lymphs Abs: 6 10*3/uL — ABNORMAL HIGH (ref 0.7–4.0)
MCH: 26.5 pg (ref 26.0–34.0)
MCHC: 31.4 g/dL (ref 30.0–36.0)
MCV: 84.5 fL (ref 80.0–100.0)
Monocytes Absolute: 40 10*3/uL — ABNORMAL HIGH (ref 0.1–1.0)
Monocytes Relative: 27 %
Neutro Abs: 66.1 10*3/uL — ABNORMAL HIGH (ref 1.7–7.7)
Neutrophils Relative %: 45 %
Platelets: 96 10*3/uL — ABNORMAL LOW (ref 150–400)
RBC: 2.83 MIL/uL — ABNORMAL LOW (ref 4.22–5.81)
RDW: 17.6 % — ABNORMAL HIGH (ref 11.5–15.5)
Smear Review: NORMAL
WBC: 148.3 10*3/uL (ref 4.0–10.5)
nRBC: 0.3 % — ABNORMAL HIGH (ref 0.0–0.2)

## 2020-12-25 LAB — PREPARE RBC (CROSSMATCH)

## 2020-12-26 ENCOUNTER — Telehealth: Payer: Self-pay | Admitting: Oncology

## 2020-12-26 ENCOUNTER — Inpatient Hospital Stay: Payer: Medicare Other

## 2020-12-26 DIAGNOSIS — D649 Anemia, unspecified: Secondary | ICD-10-CM

## 2020-12-26 DIAGNOSIS — D4621 Refractory anemia with excess of blasts 1: Secondary | ICD-10-CM | POA: Diagnosis not present

## 2020-12-26 LAB — SAMPLE TO BLOOD BANK

## 2020-12-26 MED ORDER — SODIUM CHLORIDE 0.9% IV SOLUTION
250.0000 mL | Freq: Once | INTRAVENOUS | Status: AC
  Administered 2020-12-26: 250 mL via INTRAVENOUS
  Filled 2020-12-26: qty 250

## 2020-12-26 MED ORDER — DIPHENHYDRAMINE HCL 25 MG PO CAPS: 25.0000 mg | ORAL_CAPSULE | Freq: Once | ORAL | Status: DC

## 2020-12-26 MED ORDER — ACETAMINOPHEN 325 MG PO TABS
650.0000 mg | ORAL_TABLET | Freq: Once | ORAL | Status: DC
Start: 2020-12-26 — End: 2020-12-26

## 2020-12-26 NOTE — Telephone Encounter (Signed)
Attempts x 2 to call patient to notify him of appt scheduled for tomorrow. Line busy.

## 2020-12-26 NOTE — Telephone Encounter (Signed)
Called patient's spouse to make her aware of appt scheduled to see Dr. Janese Banks tomorrow 4/29. Mailbox is full/unable to leave VM.

## 2020-12-27 ENCOUNTER — Inpatient Hospital Stay: Payer: Medicare Other

## 2020-12-27 ENCOUNTER — Inpatient Hospital Stay (HOSPITAL_BASED_OUTPATIENT_CLINIC_OR_DEPARTMENT_OTHER): Payer: Medicare Other | Admitting: Oncology

## 2020-12-27 ENCOUNTER — Other Ambulatory Visit: Payer: Self-pay

## 2020-12-27 ENCOUNTER — Encounter: Payer: Self-pay | Admitting: Oncology

## 2020-12-27 ENCOUNTER — Telehealth: Payer: Self-pay | Admitting: Oncology

## 2020-12-27 VITALS — BP 137/48 | HR 79 | Temp 97.0°F | Resp 18 | Wt 172.1 lb

## 2020-12-27 DIAGNOSIS — D72829 Elevated white blood cell count, unspecified: Secondary | ICD-10-CM

## 2020-12-27 DIAGNOSIS — D469 Myelodysplastic syndrome, unspecified: Secondary | ICD-10-CM

## 2020-12-27 DIAGNOSIS — C9200 Acute myeloblastic leukemia, not having achieved remission: Secondary | ICD-10-CM | POA: Insufficient documentation

## 2020-12-27 DIAGNOSIS — E790 Hyperuricemia without signs of inflammatory arthritis and tophaceous disease: Secondary | ICD-10-CM | POA: Diagnosis not present

## 2020-12-27 DIAGNOSIS — D649 Anemia, unspecified: Secondary | ICD-10-CM

## 2020-12-27 DIAGNOSIS — D4621 Refractory anemia with excess of blasts 1: Secondary | ICD-10-CM | POA: Diagnosis not present

## 2020-12-27 LAB — CBC WITH DIFFERENTIAL/PLATELET
Abs Immature Granulocytes: 40.62 10*3/uL — ABNORMAL HIGH (ref 0.00–0.07)
Basophils Absolute: 0.8 10*3/uL — ABNORMAL HIGH (ref 0.0–0.1)
Basophils Relative: 1 %
Eosinophils Absolute: 1 10*3/uL — ABNORMAL HIGH (ref 0.0–0.5)
Eosinophils Relative: 1 %
HCT: 27.7 % — ABNORMAL LOW (ref 39.0–52.0)
Hemoglobin: 8.6 g/dL — ABNORMAL LOW (ref 13.0–17.0)
Immature Granulocytes: 25 %
Lymphocytes Relative: 4 %
Lymphs Abs: 5.9 10*3/uL — ABNORMAL HIGH (ref 0.7–4.0)
MCH: 26.5 pg (ref 26.0–34.0)
MCHC: 31 g/dL (ref 30.0–36.0)
MCV: 85.5 fL (ref 80.0–100.0)
Monocytes Absolute: 41.9 10*3/uL — ABNORMAL HIGH (ref 0.1–1.0)
Monocytes Relative: 26 %
Neutro Abs: 71.5 10*3/uL — ABNORMAL HIGH (ref 1.7–7.7)
Neutrophils Relative %: 43 %
Platelets: 110 10*3/uL — ABNORMAL LOW (ref 150–400)
RBC: 3.24 MIL/uL — ABNORMAL LOW (ref 4.22–5.81)
RDW: 18.6 % — ABNORMAL HIGH (ref 11.5–15.5)
Smear Review: DECREASED
WBC: 161.6 10*3/uL (ref 4.0–10.5)
nRBC: 0.3 % — ABNORMAL HIGH (ref 0.0–0.2)

## 2020-12-27 LAB — COMPREHENSIVE METABOLIC PANEL
ALT: 48 U/L — ABNORMAL HIGH (ref 0–44)
AST: 52 U/L — ABNORMAL HIGH (ref 15–41)
Albumin: 4.2 g/dL (ref 3.5–5.0)
Alkaline Phosphatase: 58 U/L (ref 38–126)
Anion gap: 11 (ref 5–15)
BUN: 17 mg/dL (ref 8–23)
CO2: 27 mmol/L (ref 22–32)
Calcium: 9.4 mg/dL (ref 8.9–10.3)
Chloride: 101 mmol/L (ref 98–111)
Creatinine, Ser: 1.44 mg/dL — ABNORMAL HIGH (ref 0.61–1.24)
GFR, Estimated: 46 mL/min — ABNORMAL LOW (ref >60)
Glucose, Bld: 226 mg/dL — ABNORMAL HIGH (ref 70–99)
Potassium: 3.9 mmol/L (ref 3.5–5.1)
Sodium: 139 mmol/L (ref 135–145)
Total Bilirubin: 0.8 mg/dL (ref 0.3–1.2)
Total Protein: 7.8 g/dL (ref 6.5–8.1)

## 2020-12-27 LAB — TYPE AND SCREEN
ABO/RH(D): B POS
Antibody Screen: NEGATIVE
Unit division: 0

## 2020-12-27 LAB — PROTIME-INR
INR: 1.3 — ABNORMAL HIGH (ref 0.8–1.2)
Prothrombin Time: 15.9 seconds — ABNORMAL HIGH (ref 11.4–15.2)

## 2020-12-27 LAB — BPAM RBC
Blood Product Expiration Date: 202205092359
ISSUE DATE / TIME: 202204280951
Unit Type and Rh: 5100

## 2020-12-27 LAB — PHOSPHORUS: Phosphorus: 4 mg/dL (ref 2.5–4.6)

## 2020-12-27 LAB — FIBRINOGEN: Fibrinogen: 259 mg/dL (ref 210–475)

## 2020-12-27 LAB — APTT: aPTT: 50 seconds — ABNORMAL HIGH (ref 24–36)

## 2020-12-27 LAB — LACTATE DEHYDROGENASE: LDH: 650 U/L — ABNORMAL HIGH (ref 98–192)

## 2020-12-27 LAB — URIC ACID: Uric Acid, Serum: 7.5 mg/dL (ref 3.7–8.6)

## 2020-12-27 NOTE — Telephone Encounter (Signed)
Attempt made to contact patient. Line busy.

## 2020-12-29 LAB — GLUCOSE 6 PHOSPHATE DEHYDROGENASE
G6PDH: 49.5 U/g{Hb} — ABNORMAL HIGH (ref 4.8–15.7)
Hemoglobin: 8.8 g/dL — ABNORMAL LOW (ref 13.0–17.7)

## 2020-12-29 NOTE — Progress Notes (Signed)
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 This was associated with bone marrow monocytosis and borderline number to slight increase in blastic cells. The overall changes favor a primary myeloid neoplasm, particularly a myelodysplastic syndrome especially refractory anemia with excess blasts (RAEB-1) or possibly refractory cytopenia with multilineage dysplasia. Consideration was also given to an evolving myelodysplastic/myeloproliferative neoplasm such as chronic myelomonocytic leukemia but the lack of absolute peripheral monocytosis precluded such a diagnosis at this time. Flow cytometrywas negative. Cytogeneticswere normal (46, XY). Foundation one was positive for ASXL1 X735HG*99, EZH2 Splice site 242-6S>T, RUNX1 [id number]. IPSS-R4.5, intermediate group.  Patient received treatment with VidazaBetween March 2019 up until February 2021 when it was stopped as he was not having any significant response.  He was also seen by [facility] hematology Dr. Katina Dung.  Last seen by him in June 2021.  He has undergone multiple bone marrow's in the past with the last 1 being in June 2021 which revealed a hypercellular bone marrow with persistent morphologic dyspoiesis and 9% blasts.  Flow  cytometry at that time had showed for 6% circulating myeloblasts.  He was given the option of proceeding with Revlimid if blast count was less than 10% and considering venetoclax Vidaza if blast count was more than 10% by Dr. Garnette Scheuermann.  Patient had declined both of these options and was being monitored conservatively on Hydrea which she is presently taking 500 mg daily.  He could not tolerate higher doses due to diarrhea.  Patient had pancytopenia up until November 2021.  Since then there has been a persistent increase in his white cell count which went up to 100 in March 2022.  Hemoglobin at baseline is between 7-8 and he has been requiring weekly PRBC transfusions.  Platelet counts presently stable between 80s -100s.  Patient seen by Dr. Mike Gip up until now and now presents for transfer of care as she is no longer practicing with [place].  Interval history-patient is here with his wife.  Both are extremely hard of hearing.  Patient reports some fatigue but has otherwise been doing well without any significant symptoms of chest pain shortness of breath changes in vision or strokelike symptoms.  He reports mild intermittent diarrhea.  Weight has remained stable  ECOG PS- 2 Pain scale- 0  Review of systems- Review of Systems  Constitutional: Positive for malaise/fatigue. Negative for chills, fever and weight loss.  HENT: Negative for congestion, ear discharge and nosebleeds.   Eyes: Negative for blurred vision.  Respiratory: Negative for cough, hemoptysis, sputum production, shortness of breath and wheezing.   Cardiovascular: Negative for chest pain, palpitations, orthopnea and claudication.  Gastrointestinal: Positive for diarrhea. Negative for abdominal pain, blood in stool, constipation, heartburn, melena, nausea and vomiting.  Genitourinary: Negative for dysuria, flank pain, frequency, hematuria and urgency.  Musculoskeletal: Negative for back pain, joint pain and myalgias.  Skin:  Negative for rash.  Neurological: Negative for dizziness, tingling, focal weakness, seizures, weakness and headaches.  Endo/Heme/Allergies: Does not bruise/bleed easily.  Psychiatric/Behavioral: Negative for depression and suicidal ideas. The patient does not have insomnia.       No Known Allergies   Past Medical History:  Diagnosis Date  . Anemia   . Arthritis   . BPH (benign prostatic hypertrophy)   . Cancer ([place])   . Complication of anesthesia    nausea  . Diabetes type 2, controlled ([place])   . HOH (hard of hearing)    r and L ears-70% loss per pt  . Hyperlipidemia   . Hypertension   . Myelodysplasia (myelodysplastic syndrome) ([place]) 2019     Past Surgical History:  Procedure Laterality Date  . APPENDECTOMY    . COLONOSCOPY  09/21/08   1 polyp found, tubular adenoma  . HAND SURGERY    . KNEE SURGERY Left     Social History   Socioeconomic History  . Marital status: Married    Spouse name: Not on file  . Number of children: Not on file  . Years of education: Not on file  . Highest education level: Not on file  Occupational History  . Not on file  Tobacco Use  . Smoking status: Never Smoker  . Smokeless tobacco: Never Used  Vaping Use  . Vaping Use: Never used  Substance and Sexual Activity  . Alcohol use: No    Alcohol/week: 0.0 standard drinks  . Drug use: No  . Sexual activity: Not on file  Other Topics Concern  . Not on file  Social History Narrative   Lives in private residence with wife   Social Determinants of Health   Financial Resource Strain: Not on file  Food Insecurity: Not on file  Transportation Needs: Not on file  Physical Activity: Not on file  Stress: Not on file  Social Connections: Not on file  Intimate Partner Violence: Not on file    Family History  Problem Relation Age of Onset  . Aneurysm Mother   . Heart disease Father   . Cancer Brother        skin  . Heart disease Brother   . Heart disease Brother   . Cancer  Sister        skin  . Aneurysm Brother   . Heart disease Brother   . Heart disease Sister   . Heart disease Sister   . COPD Sister   . Diabetes Sister      Current Outpatient Medications:  .  allopurinol (ZYLOPRIM) 300 MG tablet, Take 0.5 tablets (150 mg total) by mouth daily., Disp: 30 tablet, Rfl: 0 .  aspirin 81 MG chewable tablet, Chew 81 mg by mouth daily., Disp: , Rfl:  .  Calcium Carbonate-Vit D-Min (CALCIUM 1200 PO), Take 1,200 mg by mouth in the morning and at bedtime., Disp: , Rfl:  .  Cranberry (THERACRAN PO), Take 1 tablet by mouth daily. , Disp: , Rfl:  .  cyanocobalamin 1000 MCG tablet, Take 1,000 mcg by mouth daily., Disp: , Rfl:  .  finasteride (PROSCAR) 5 MG tablet, Take 5 mg by mouth daily., Disp: , Rfl:  .  glimepiride (AMARYL) 1 MG tablet, Take 1 tablet by mouth daily after breakfast., Disp: , Rfl:  .  glucose blood test strip, FreeStyle Lite Strips, Disp: , Rfl:  .  hydroxyurea (HYDREA) 500 MG capsule, Take 1 capsule (500 mg total) by mouth daily. May take with food  to minimize GI side effects., Disp: 30 capsule, Rfl: 0 .  LANCETS ULTRA FINE MISC, Lancets,Ultra Thin 26 gauge, Disp: , Rfl:  .  lisinopril (PRINIVIL,ZESTRIL) 10 MG tablet, Take 10 mg by mouth daily., Disp: , Rfl:  .  loperamide (IMODIUM) 2 MG capsule, Take 1 capsule (2 mg total) by mouth See admin instructions. With onset of loose stool, take 73m followed by 265mevery 2 hours until loose bowel movement stopped. Maximum: 16 mg/day, Disp: 30 capsule, Rfl: 0 .  metformin (FORTAMET) 1000 MG (OSM) 24 hr tablet, Take 1,000 mg by mouth 2 (two) times daily with a meal., Disp: , Rfl:  .  Multiple Vitamins-Minerals (CENTRUM SILVER PO), Take by mouth., Disp: , Rfl:  .  Omega-3 Fatty Acids (FISH OIL PO), Take 1 tablet by mouth daily. , Disp: , Rfl:  .  Polyethylene Glycol 3350 (MIRALAX PO), Take by mouth as needed. , Disp: , Rfl:  .  Saw Palmetto-Phytosterols (PROSTATE SR PO), Take 1 tablet by mouth daily. , Disp: ,  Rfl:  .  simvastatin (ZOCOR) 40 MG tablet, Take 40 mg by mouth daily., Disp: , Rfl:  .  meclizine (ANTIVERT) 25 MG tablet, Take 25 mg by mouth 2 (two) times daily as needed.  (Patient not taking: No sig reported), Disp: , Rfl:  .  meloxicam (MOBIC) 7.5 MG tablet, Take 7.5 mg by mouth daily. (Patient not taking: Reported on 12/27/2020), Disp: , Rfl:  .  mupirocin ointment (BACTROBAN) 2 %, Apply 1 application topically daily. With dressing changes (Patient not taking: No sig reported), Disp: 22 g, Rfl: 0 .  ondansetron (ZOFRAN) 4 MG tablet, Take 1 tablet (4 mg total) by mouth every 6 (six) hours as needed for nausea or vomiting. (Patient not taking: No sig reported), Disp: 30 tablet, Rfl: 2 No current facility-administered medications for this visit.  Facility-Administered Medications Ordered in Other Visits:  .  acetaminophen (TYLENOL) tablet 650 mg, 650 mg, Oral, Once, Corcoran, Melissa C, MD .  acetaminophen (TYLENOL) tablet 650 mg, 650 mg, Oral, Once, Corcoran, Melissa C, MD .  acetaminophen (TYLENOL) tablet 650 mg, 650 mg, Oral, Once, Corcoran, Melissa C, MD .  acetaminophen (TYLENOL) tablet 650 mg, 650 mg, Oral, Once, Corcoran, Melissa C, MD .  acetaminophen (TYLENOL) tablet 650 mg, 650 mg, Oral, Once, Corcoran, Melissa C, MD .  diphenhydrAMINE (BENADRYL) capsule 25 mg, 25 mg, Oral, Once, Corcoran, Melissa C, MD .  diphenhydrAMINE (BENADRYL) capsule 25 mg, 25 mg, Oral, Once, Corcoran, Melissa C, MD .  diphenhydrAMINE (BENADRYL) capsule 25 mg, 25 mg, Oral, Once, Corcoran, Melissa C, MD .  diphenhydrAMINE (BENADRYL) capsule 25 mg, 25 mg, Oral, Once, Corcoran, Melissa C, MD .  diphenhydrAMINE (BENADRYL) capsule 25 mg, 25 mg, Oral, Once, CoLequita AsalMD  Physical exam:  Vitals:   12/27/20 1102  BP: (!) 137/48  Pulse: 79  Resp: 18  Temp: (!) 97 F (36.1 C)  SpO2: 100%  Weight: 172 lb 1.6 oz (78.1 kg)   Physical Exam Constitutional:      Comments: Frail elderly gentleman who  appears in no acute distress  Cardiovascular:     Rate and Rhythm: Normal rate and regular rhythm.     Heart sounds: Normal heart sounds.  Pulmonary:     Effort: Pulmonary effort is normal.     Breath sounds: Normal breath sounds.  Abdominal:     General: Bowel sounds are normal.     Palpations: Abdomen is soft.  Comments: No palpable hepatosplenomegaly  Skin:    General: Skin is warm and dry.  Neurological:     Mental Status: He is alert and oriented to person, place, and time.      CMP Latest Ref Rng & Units 12/27/2020  Glucose 70 - 99 mg/dL 226(H)  BUN 8 - 23 mg/dL 17  Creatinine 0.61 - 1.24 mg/dL 1.44(H)  Sodium 135 - 145 mmol/L 139  Potassium 3.5 - 5.1 mmol/L 3.9  Chloride 98 - 111 mmol/L 101  CO2 22 - 32 mmol/L 27  Calcium 8.9 - 10.3 mg/dL 9.4  Total Protein 6.5 - 8.1 g/dL 7.8  Total Bilirubin 0.3 - 1.2 mg/dL 0.8  Alkaline Phos 38 - 126 U/L 58  AST 15 - 41 U/L 52(H)  ALT 0 - 44 U/L 48(H)   CBC Latest Ref Rng & Units 12/27/2020  WBC 4.0 - 10.5 K/uL 161.6(HH)  Hemoglobin 13.0 - 17.0 g/dL 8.6(L)  Hematocrit 39.0 - 52.0 % 27.7(L)  Platelets 150 - 400 K/uL 110(L)     Assessment and plan- Patient is a 87 y.o. male with MDS RAEB-1 currently on palliative Hydrea here to discuss further management  Discussed with the patient and his wife that patient's white cell count has been steadily going up since November 2021.  His white cell count was 100 in March 2022 and most recently was noted to be 148 which is concerning for blastic transformation to an acute leukemia.  Surprisingly despite the high white cell count patient is relatively asymptomatic with no signs and symptoms of hyperleukocytosis.  He has not been able to tolerate higher doses of Hydrea due to diarrhea.  Looks like he has received Vidaza in the past for a while which was ultimately stopped in February 2021 as it was not improving his counts.  Today I will check CBC with differential, smear review, flow  cytometry, LDH, uric acid phosphorus and coagulation panel.  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  Discussed the following options moving forward  1.  Best supportive care/hospice.  Which would mean foregoing blood transfusions and focusing on his quality of life at home  2.  Considering treatment which would include venetoclax plus Vidaza.  Discussed that this combination treatment is associated with high risk of tumor lysis syndrome as well as infectious complications.  Given his age and potential for complications-I would prefer that venetoclax is given to him as an inpatient preferably in the leukemia unit at [facility].  I have spoken to patient's daughter-in-law Diane and explained the above situation to her as well per patient request.  She is willing to drive the patient to [facility] for hospitalization if need be.  They understand that hospitalization may last for 3 to 5 days for titrating the dose of venetoclax.  I have also spoken to [facility] Dr. Katina Dung.  Given that patient is relatively asymptomatic at this time he is willing to see him on 12/30/2020 as an outpatient at 9 AM in his clinic with plans to admit him on the same day.  If he tolerates cycle 1 well we could certainly give further cycles as an outpatient here in [place] after he is on a stable dose of venetoclax which often needs multiple dose adjustments with pancytopenia.  Overall prognosis remains poor and even with treatment overall survival  is likely less than 6 months.  At this time patient and her husband would like to go to [facility].  I will defer a bone marrow biopsy at this time since it can be done at [facility].  We will contact the patient again on Monday morning to confirm his decision.   Total face to face encounter time for this patient visit was 40 min.  Total  non face to face encounter time for this patient on the day of the visit was 20 min. Time spent in reviewing outside records as well as discussing patients case with Dr. Katina Dung    Visit Diagnosis 1. Symptomatic anemia   2. MDS (myelodysplastic syndrome) (HCC)   3. Elevated uric acid in blood      Dr. Randa Evens, MD, MPH [facility] at [facility] [phone] 12/29/2020 2:04 PM

## 2020-12-30 LAB — COMP PANEL: LEUKEMIA/LYMPHOMA: Immunophenotypic Profile: 2

## 2020-12-30 NOTE — Progress Notes (Addendum)
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 This was associated with bone marrow monocytosis and borderline number to slight increase in blastic cells. The overall changes favor a primary myeloid neoplasm, particularly a myelodysplastic syndrome especially refractory anemia with excess blasts (RAEB-1) or possibly refractory cytopenia with multilineage dysplasia. Consideration was also given to an evolving myelodysplastic/myeloproliferative neoplasm such as chronic myelomonocytic leukemia but the lack of absolute peripheral monocytosis precluded such a diagnosis at this time. Flow cytometrywas negative. Cytogeneticswere normal (46, XY). Foundation one was positive for ASXL1 Q330QT*62, EZH2 Splice site 263-3H>L, RUNX1 [id number]. IPSS-R4.5, intermediate group.  Patient received treatment with VidazaBetween March 2019 up until February 2021 when it was stopped as he was not having any significant response.  He was also seen by [facility] hematology Dr. Katina Dung.  Last seen by him in June 2021.  He has undergone multiple bone marrow's in the past with the last 1 being in June 2021 which revealed a hypercellular bone marrow with persistent morphologic dyspoiesis and 9% blasts.  Flow  cytometry at that time had showed for 6% circulating myeloblasts.  He was given the option of proceeding with Revlimid if blast count was less than 10% and considering venetoclax Vidaza if blast count was more than 10% by Dr. Garnette Scheuermann.  Patient had declined both of these options and was being monitored conservatively on Hydrea which she is presently taking 500 mg daily.  He could not tolerate higher doses due to diarrhea.  Patient had pancytopenia up until November 2021.  Since then there has been a persistent increase in his white cell count which went up to 100 in March 2022.  Hemoglobin at baseline is between 7-8 and he has been requiring weekly PRBC transfusions.  Platelet counts presently stable between 80s -100s.  Patient seen by Dr. Mike Gip up until now and now presents for transfer of care as she is no longer practicing with [place]. Patient seen by Dr. Janese Banks who discussed concern for acute leukemia. Direction of care vs supportive care.   Interval history- Patient presents with his wife, both hard of hearing. Patient was seen by Dr. Katina Dung yesterday at [facility]. He returns to clinic today for continued supportive care. He was seen by Dr. Janese Banks at [facility] last week. Today, he and his wife express desire to proceed with work up and treatment of presumed acute leukemia. He is interested in extending his wife with understanding that it may impair quality of life. He has had a gradual decline over past few months. This week he feels more tired. He has been receiving weekly blood transfusions which often improve how he feels. He feels lightheaded at times which he says often occurs when he needs blood. He wishes to receive whatever care he can at [facility] to avoid driving to [facility] but understands that starting treatment at [facility] will require hospitalization  and frequent travel which he and his wife are willing to do. He has not seen palliative care in the past but says he does have advanced  directives.   ECOG PS- 2 Pain scale- 0  Review of systems- Review of Systems  Constitutional: Positive for malaise/fatigue. Negative for chills, fever and weight loss.  HENT: Negative for congestion, ear discharge and nosebleeds.   Eyes: Negative for blurred vision.  Respiratory: Negative for cough, hemoptysis, sputum production, shortness of breath and wheezing.   Cardiovascular: Negative for chest pain, palpitations, orthopnea and claudication.  Gastrointestinal: Positive for diarrhea. Negative for abdominal pain, blood in stool, constipation, heartburn, melena, nausea and vomiting.  Genitourinary: Negative for dysuria, flank pain, frequency, hematuria and urgency.  Musculoskeletal: Negative for back pain, joint pain and myalgias.  Skin: Negative for rash.  Neurological: Negative for dizziness, tingling, focal weakness, seizures, weakness and headaches.  Endo/Heme/Allergies: Does not bruise/bleed easily.  Psychiatric/Behavioral: Negative for depression and suicidal ideas. The patient does not have insomnia.      No Known Allergies   Past Medical History:  Diagnosis Date  . Anemia   . Arthritis   . BPH (benign prostatic hypertrophy)   . Cancer ([place])   . Complication of anesthesia    nausea  . Diabetes type 2, controlled ([place])   . HOH (hard of hearing)    r and L ears-70% loss per pt  . Hyperlipidemia   . Hypertension   . Myelodysplasia (myelodysplastic syndrome) ([place]) 2019     Past Surgical History:  Procedure Laterality Date  . APPENDECTOMY    . COLONOSCOPY  09/21/08   1 polyp found, tubular adenoma  . HAND SURGERY    . KNEE SURGERY Left     Social History   Socioeconomic History  . Marital status: Married    Spouse name: Not on file  . Number of children: Not on file  . Years of education: Not on file  . Highest education level: Not on file  Occupational History  . Not on file  Tobacco Use  . Smoking status: Never Smoker  . Smokeless tobacco: Never Used   Vaping Use  . Vaping Use: Never used  Substance and Sexual Activity  . Alcohol use: No    Alcohol/week: 0.0 standard drinks  . Drug use: No  . Sexual activity: Not on file  Other Topics Concern  . Not on file  Social History Narrative   Lives in private residence with wife   Social Determinants of Health   Financial Resource Strain: Not on file  Food Insecurity: Not on file  Transportation Needs: Not on file  Physical Activity: Not on file  Stress: Not on file  Social Connections: Not on file  Intimate Partner Violence: Not on file   Immunization History  Administered Date(s) Administered  . Influenza Split 06/16/2013, 07/23/2014, 05/31/2015  . Influenza Whole 06/25/2010, 07/07/2012  . Influenza,inj,Quad PF,6+ Mos 06/28/2020  . Influenza-Unspecified 06/16/2013, 07/23/2014, 05/31/2015, 06/04/2016, 06/01/2017, 06/23/2018  . PFIZER(Purple Top)SARS-COV-2 Vaccination 09/06/2019, 09/27/2019  . Pneumococcal Conjugate-13 12/04/2015  . Pneumococcal Polysaccharide-23 09/01/2011  . Tdap 09/08/2018  . Zoster 06/25/2010   Family History  Problem Relation Age of Onset  . Aneurysm Mother   . Heart disease Father   . Cancer Brother        skin  . Heart disease Brother   . Heart disease Brother   . Cancer Sister        skin  . Aneurysm Brother   .  Heart disease Brother   . Heart disease Sister   . Heart disease Sister   . COPD Sister   . Diabetes Sister     Current Outpatient Medications:  .  allopurinol (ZYLOPRIM) 300 MG tablet, Take 0.5 tablets (150 mg total) by mouth daily., Disp: 30 tablet, Rfl: 0 .  aspirin 81 MG chewable tablet, Chew 81 mg by mouth daily., Disp: , Rfl:  .  Calcium Carbonate-Vit D-Min (CALCIUM 1200 PO), Take 1,200 mg by mouth in the morning and at bedtime., Disp: , Rfl:  .  Cranberry (THERACRAN PO), Take 1 tablet by mouth daily. , Disp: , Rfl:  .  cyanocobalamin 1000 MCG tablet, Take 1,000 mcg by mouth daily., Disp: , Rfl:  .  finasteride (PROSCAR) 5  MG tablet, Take 5 mg by mouth daily., Disp: , Rfl:  .  glimepiride (AMARYL) 1 MG tablet, Take 1 tablet by mouth daily after breakfast., Disp: , Rfl:  .  glucose blood test strip, FreeStyle Lite Strips, Disp: , Rfl:  .  hydroxyurea (HYDREA) 500 MG capsule, Take 1 capsule (500 mg total) by mouth daily. May take with food to minimize GI side effects., Disp: 30 capsule, Rfl: 0 .  LANCETS ULTRA FINE MISC, Lancets,Ultra Thin 26 gauge, Disp: , Rfl:  .  lisinopril (PRINIVIL,ZESTRIL) 10 MG tablet, Take 10 mg by mouth daily., Disp: , Rfl:  .  loperamide (IMODIUM) 2 MG capsule, Take 1 capsule (2 mg total) by mouth See admin instructions. With onset of loose stool, take 41m followed by 233mevery 2 hours until loose bowel movement stopped. Maximum: 16 mg/day, Disp: 30 capsule, Rfl: 0 .  meclizine (ANTIVERT) 25 MG tablet, Take 25 mg by mouth 2 (two) times daily as needed.  (Patient not taking: No sig reported), Disp: , Rfl:  .  meloxicam (MOBIC) 7.5 MG tablet, Take 7.5 mg by mouth daily. (Patient not taking: Reported on 12/27/2020), Disp: , Rfl:  .  metformin (FORTAMET) 1000 MG (OSM) 24 hr tablet, Take 1,000 mg by mouth 2 (two) times daily with a meal., Disp: , Rfl:  .  Multiple Vitamins-Minerals (CENTRUM SILVER PO), Take by mouth., Disp: , Rfl:  .  mupirocin ointment (BACTROBAN) 2 %, Apply 1 application topically daily. With dressing changes (Patient not taking: No sig reported), Disp: 22 g, Rfl: 0 .  Omega-3 Fatty Acids (FISH OIL PO), Take 1 tablet by mouth daily. , Disp: , Rfl:  .  ondansetron (ZOFRAN) 4 MG tablet, Take 1 tablet (4 mg total) by mouth every 6 (six) hours as needed for nausea or vomiting. (Patient not taking: No sig reported), Disp: 30 tablet, Rfl: 2 .  Polyethylene Glycol 3350 (MIRALAX PO), Take by mouth as needed. , Disp: , Rfl:  .  Saw Palmetto-Phytosterols (PROSTATE SR PO), Take 1 tablet by mouth daily. , Disp: , Rfl:  .  simvastatin (ZOCOR) 40 MG tablet, Take 40 mg by mouth daily., Disp: ,  Rfl:  No current facility-administered medications for this visit.  Facility-Administered Medications Ordered in Other Visits:  .  acetaminophen (TYLENOL) tablet 650 mg, 650 mg, Oral, Once, Corcoran, Melissa C, MD .  acetaminophen (TYLENOL) tablet 650 mg, 650 mg, Oral, Once, Corcoran, Melissa C, MD .  acetaminophen (TYLENOL) tablet 650 mg, 650 mg, Oral, Once, Corcoran, Melissa C, MD .  acetaminophen (TYLENOL) tablet 650 mg, 650 mg, Oral, Once, Corcoran, Melissa C, MD .  acetaminophen (TYLENOL) tablet 650 mg, 650 mg, Oral, Once, Corcoran, Melissa C, MD .  diphenhydrAMINE (  BENADRYL) capsule 25 mg, 25 mg, Oral, Once, Corcoran, Melissa C, MD .  diphenhydrAMINE (BENADRYL) capsule 25 mg, 25 mg, Oral, Once, Corcoran, Melissa C, MD .  diphenhydrAMINE (BENADRYL) capsule 25 mg, 25 mg, Oral, Once, Corcoran, Melissa C, MD .  diphenhydrAMINE (BENADRYL) capsule 25 mg, 25 mg, Oral, Once, Corcoran, Melissa C, MD .  diphenhydrAMINE (BENADRYL) capsule 25 mg, 25 mg, Oral, Once, Lequita Asal, MD  Physical exam:  Vitals:   12/31/20 0828  BP: (!) 120/43  Pulse: 76  Resp: 18  Temp: (!) 97.4 F (36.3 C)  SpO2: 98%  Weight: 172 lb 6.4 oz (78.2 kg)   Physical Exam Constitutional:      Comments: Frail elderly gentleman who appears in no acute distress. Accompanied by wife  HENT:     Ears:     Comments: Significant hearing impairment. Hearing aids in place bilaterally. Eyes:     Comments: Right conjunctiva reddened  Cardiovascular:     Rate and Rhythm: Normal rate and regular rhythm.     Heart sounds: Normal heart sounds.  Pulmonary:     Effort: Pulmonary effort is normal.     Breath sounds: Normal breath sounds.  Abdominal:     General: There is no distension.     Tenderness: There is no abdominal tenderness.  Musculoskeletal:     Comments: ambulatory  Skin:    General: Skin is warm and dry.  Neurological:     General: No focal deficit present.     Mental Status: He is alert and oriented  to person, place, and time.  Psychiatric:        Mood and Affect: Mood normal.        Behavior: Behavior normal.      CMP Latest Ref Rng & Units 12/31/2020  Glucose 70 - 99 mg/dL 260(H)  BUN 8 - 23 mg/dL 23  Creatinine 0.61 - 1.24 mg/dL 1.32(H)  Sodium 135 - 145 mmol/L 133(L)  Potassium 3.5 - 5.1 mmol/L 3.5  Chloride 98 - 111 mmol/L 100  CO2 22 - 32 mmol/L 26  Calcium 8.9 - 10.3 mg/dL 8.9  Total Protein 6.5 - 8.1 g/dL 6.9  Total Bilirubin 0.3 - 1.2 mg/dL 0.5  Alkaline Phos 38 - 126 U/L 48  AST 15 - 41 U/L 44(H)  ALT 0 - 44 U/L 39   CBC Latest Ref Rng & Units 12/31/2020  WBC 4.0 - 10.5 K/uL 151.3(HH)  Hemoglobin 13.0 - 17.0 g/dL 7.0(L)  Hematocrit 39.0 - 52.0 % 22.1(L)  Platelets 150 - 400 K/uL 80(L)     Assessment and plan- Patient is a 87 y.o. male with history of MDS (RAEB-1) s/p complete response with azacitadine, progressed in February 2021, currently on hydrea. He has progressive weakness, gradually increasing wbc counts with likely new mutation. Symptoms and labs concerning for progression to acute leukemia/AML. He has met with medical oncology at [facility] and discussed supportive care vs treatment. He saw Dr. Katina Dung at [facility] who presented same options. Today, patient elects to proceed with bone marrow biopsy at [facility] which is planned for 01/02/21. Based on results, he will likely require hospitalization for chemotherapy which will be at [facility]. If he is able to tolerate cycle 1 at [facility] then we could consider giving further cycles locally. Patient requests to receive care locally when possible and we will continue to offer supportive care but [facility] will direct care for now.   Labs today reviewed. Hemoglobin 7.0. WBC 151.3. Platelets 80. Plan for  blood transfusion today. Patient will require irradiated blood products. 2% blasts in peripheral blood. Will only plan for transfusion in the future if hemoglobin < 7 as transfusions can increase hyperviscosity if there is progression to AML. Avoid  transfusions as much as possible.   Again reviewed that prognosis, with treatment is poor. Patient and husband are understanding and would like to proceed with biopsy and treatment. We reviewed palliative care services and patient is agreeable to meeting with palliative care to discuss goals of care and update advance directives.   Plan: Proceed with transfusion today. Will check iron studies to evaluate for possible iron overload given multiple transfusions. Proceed with bone marrow at [facility] on 5/5.  RTC in 1 week for labs, re-evaluation, and possible blood transfusion  I discussed the assessment and treatment plan with the patient. The patient was provided an opportunity to ask questions and all were answered. The patient agreed with the plan and demonstrated an understanding of the instructions.   The patient was advised to call back or seek an in-person evaluation if the symptoms worsen or if the condition fails to improve as anticipated.   I spent 30 minutes face-to-face video visit time dedicated to the care of this patient on the date of this encounter to include pre-visit review of medical oncology/Dr.Corcoran and Dr. Elroy Channel note, [facility], Dr. Neldon Labella note, face-to-face time with the patient, and post visit ordering of testing/documentation.    Visit Diagnosis 1. CML (chronic myelocytic leukemia) ([place])   2. Symptomatic anemia    Beckey Rutter, DNP, AGNP-C [facility] [phone] (clinic)  CC: Dr. Janese Banks, Dr. Katina Dung

## 2020-12-31 ENCOUNTER — Inpatient Hospital Stay: Payer: Medicare Other

## 2020-12-31 ENCOUNTER — Other Ambulatory Visit: Payer: Self-pay

## 2020-12-31 ENCOUNTER — Inpatient Hospital Stay: Payer: Medicare Other | Attending: Oncology | Admitting: Oncology

## 2020-12-31 ENCOUNTER — Inpatient Hospital Stay (HOSPITAL_BASED_OUTPATIENT_CLINIC_OR_DEPARTMENT_OTHER): Payer: Medicare Other | Admitting: Nurse Practitioner

## 2020-12-31 ENCOUNTER — Other Ambulatory Visit: Payer: Self-pay | Admitting: *Deleted

## 2020-12-31 VITALS — BP 120/43 | HR 76 | Temp 97.4°F | Resp 18 | Wt 172.4 lb

## 2020-12-31 DIAGNOSIS — H903 Sensorineural hearing loss, bilateral: Secondary | ICD-10-CM | POA: Insufficient documentation

## 2020-12-31 DIAGNOSIS — I10 Essential (primary) hypertension: Secondary | ICD-10-CM | POA: Diagnosis not present

## 2020-12-31 DIAGNOSIS — E119 Type 2 diabetes mellitus without complications: Secondary | ICD-10-CM | POA: Insufficient documentation

## 2020-12-31 DIAGNOSIS — Z5111 Encounter for antineoplastic chemotherapy: Secondary | ICD-10-CM | POA: Diagnosis present

## 2020-12-31 DIAGNOSIS — R112 Nausea with vomiting, unspecified: Secondary | ICD-10-CM | POA: Insufficient documentation

## 2020-12-31 DIAGNOSIS — D649 Anemia, unspecified: Secondary | ICD-10-CM

## 2020-12-31 DIAGNOSIS — R197 Diarrhea, unspecified: Secondary | ICD-10-CM | POA: Diagnosis not present

## 2020-12-31 DIAGNOSIS — C9210 Chronic myeloid leukemia, BCR/ABL-positive, not having achieved remission: Secondary | ICD-10-CM | POA: Diagnosis not present

## 2020-12-31 DIAGNOSIS — D469 Myelodysplastic syndrome, unspecified: Secondary | ICD-10-CM

## 2020-12-31 DIAGNOSIS — R059 Cough, unspecified: Secondary | ICD-10-CM | POA: Insufficient documentation

## 2020-12-31 DIAGNOSIS — C9200 Acute myeloblastic leukemia, not having achieved remission: Secondary | ICD-10-CM | POA: Insufficient documentation

## 2020-12-31 DIAGNOSIS — R000 Tachycardia, unspecified: Secondary | ICD-10-CM | POA: Insufficient documentation

## 2020-12-31 DIAGNOSIS — R5383 Other fatigue: Secondary | ICD-10-CM | POA: Diagnosis not present

## 2020-12-31 DIAGNOSIS — D72829 Elevated white blood cell count, unspecified: Secondary | ICD-10-CM

## 2020-12-31 DIAGNOSIS — H527 Unspecified disorder of refraction: Secondary | ICD-10-CM | POA: Insufficient documentation

## 2020-12-31 DIAGNOSIS — R519 Headache, unspecified: Secondary | ICD-10-CM | POA: Diagnosis not present

## 2020-12-31 LAB — COMPREHENSIVE METABOLIC PANEL
ALT: 39 U/L (ref 0–44)
AST: 44 U/L — ABNORMAL HIGH (ref 15–41)
Albumin: 3.7 g/dL (ref 3.5–5.0)
Alkaline Phosphatase: 48 U/L (ref 38–126)
Anion gap: 7 (ref 5–15)
BUN: 23 mg/dL (ref 8–23)
CO2: 26 mmol/L (ref 22–32)
Calcium: 8.9 mg/dL (ref 8.9–10.3)
Chloride: 100 mmol/L (ref 98–111)
Creatinine, Ser: 1.32 mg/dL — ABNORMAL HIGH (ref 0.61–1.24)
GFR, Estimated: 51 mL/min — ABNORMAL LOW (ref >60)
Glucose, Bld: 260 mg/dL — ABNORMAL HIGH (ref 70–99)
Potassium: 3.5 mmol/L (ref 3.5–5.1)
Sodium: 133 mmol/L — ABNORMAL LOW (ref 135–145)
Total Bilirubin: 0.5 mg/dL (ref 0.3–1.2)
Total Protein: 6.9 g/dL (ref 6.5–8.1)

## 2020-12-31 LAB — CBC WITH DIFFERENTIAL/PLATELET
Abs Immature Granulocytes: 35.92 10*3/uL — ABNORMAL HIGH (ref 0.00–0.07)
Basophils Absolute: 0.5 10*3/uL — ABNORMAL HIGH (ref 0.0–0.1)
Basophils Relative: 0 %
Eosinophils Absolute: 0.9 10*3/uL — ABNORMAL HIGH (ref 0.0–0.5)
Eosinophils Relative: 1 %
HCT: 22.1 % — ABNORMAL LOW (ref 39.0–52.0)
Hemoglobin: 7 g/dL — ABNORMAL LOW (ref 13.0–17.0)
Immature Granulocytes: 24 %
Lymphocytes Relative: 7 %
Lymphs Abs: 10 10*3/uL — ABNORMAL HIGH (ref 0.7–4.0)
MCH: 26.6 pg (ref 26.0–34.0)
MCHC: 31.7 g/dL (ref 30.0–36.0)
MCV: 84 fL (ref 80.0–100.0)
Monocytes Absolute: 34.7 10*3/uL — ABNORMAL HIGH (ref 0.1–1.0)
Monocytes Relative: 23 %
Neutro Abs: 69.5 10*3/uL — ABNORMAL HIGH (ref 1.7–7.7)
Neutrophils Relative %: 45 %
Platelets: 80 10*3/uL — ABNORMAL LOW (ref 150–400)
RBC: 2.63 MIL/uL — ABNORMAL LOW (ref 4.22–5.81)
RDW: 17.6 % — ABNORMAL HIGH (ref 11.5–15.5)
WBC: 151.3 10*3/uL (ref 4.0–10.5)
nRBC: 0.2 % (ref 0.0–0.2)

## 2020-12-31 LAB — SAMPLE TO BLOOD BANK

## 2020-12-31 LAB — PREPARE RBC (CROSSMATCH)

## 2020-12-31 MED ORDER — SODIUM CHLORIDE 0.9% IV SOLUTION
250.0000 mL | Freq: Once | INTRAVENOUS | Status: AC
Start: 2020-12-31 — End: 2020-12-31
  Administered 2020-12-31: 250 mL via INTRAVENOUS
  Filled 2020-12-31: qty 250

## 2020-12-31 NOTE — Progress Notes (Signed)
Is he coming to get blood tomorrow before his biopsy?  Faythe Casa, NP 12/31/2020 9:55 AM

## 2021-01-01 ENCOUNTER — Other Ambulatory Visit: Payer: TRICARE For Life (TFL)

## 2021-01-01 ENCOUNTER — Ambulatory Visit: Payer: TRICARE For Life (TFL) | Admitting: Oncology

## 2021-01-01 ENCOUNTER — Ambulatory Visit: Payer: TRICARE For Life (TFL)

## 2021-01-01 LAB — BPAM RBC
Blood Product Expiration Date: 202205222359
ISSUE DATE / TIME: 202205031312
Unit Type and Rh: 1700

## 2021-01-01 LAB — TYPE AND SCREEN
ABO/RH(D): B POS
Antibody Screen: NEGATIVE
Unit division: 0

## 2021-01-02 ENCOUNTER — Ambulatory Visit: Payer: TRICARE For Life (TFL)

## 2021-01-07 ENCOUNTER — Inpatient Hospital Stay: Payer: Medicare Other

## 2021-01-07 ENCOUNTER — Inpatient Hospital Stay (HOSPITAL_BASED_OUTPATIENT_CLINIC_OR_DEPARTMENT_OTHER): Payer: Medicare Other | Admitting: Nurse Practitioner

## 2021-01-07 ENCOUNTER — Other Ambulatory Visit: Payer: Self-pay | Admitting: *Deleted

## 2021-01-07 ENCOUNTER — Other Ambulatory Visit: Payer: Self-pay

## 2021-01-07 ENCOUNTER — Encounter: Payer: Self-pay | Admitting: Nurse Practitioner

## 2021-01-07 ENCOUNTER — Ambulatory Visit: Payer: TRICARE For Life (TFL)

## 2021-01-07 VITALS — BP 129/41 | HR 81 | Temp 97.9°F | Resp 16 | Wt 170.2 lb

## 2021-01-07 DIAGNOSIS — D469 Myelodysplastic syndrome, unspecified: Secondary | ICD-10-CM

## 2021-01-07 DIAGNOSIS — Z7189 Other specified counseling: Secondary | ICD-10-CM

## 2021-01-07 DIAGNOSIS — D649 Anemia, unspecified: Secondary | ICD-10-CM

## 2021-01-07 DIAGNOSIS — Z5111 Encounter for antineoplastic chemotherapy: Secondary | ICD-10-CM | POA: Diagnosis not present

## 2021-01-07 DIAGNOSIS — C9210 Chronic myeloid leukemia, BCR/ABL-positive, not having achieved remission: Secondary | ICD-10-CM

## 2021-01-07 LAB — CBC WITH DIFFERENTIAL/PLATELET
Abs Immature Granulocytes: 30.61 10*3/uL — ABNORMAL HIGH (ref 0.00–0.07)
Basophils Absolute: 0.4 10*3/uL — ABNORMAL HIGH (ref 0.0–0.1)
Basophils Relative: 0 %
Eosinophils Absolute: 0.9 10*3/uL — ABNORMAL HIGH (ref 0.0–0.5)
Eosinophils Relative: 1 %
HCT: 22.1 % — ABNORMAL LOW (ref 39.0–52.0)
Hemoglobin: 7.1 g/dL — ABNORMAL LOW (ref 13.0–17.0)
Immature Granulocytes: 23 %
Lymphocytes Relative: 12 %
Lymphs Abs: 15.9 10*3/uL — ABNORMAL HIGH (ref 0.7–4.0)
MCH: 26.9 pg (ref 26.0–34.0)
MCHC: 32.1 g/dL (ref 30.0–36.0)
MCV: 83.7 fL (ref 80.0–100.0)
Monocytes Absolute: 19.7 10*3/uL — ABNORMAL HIGH (ref 0.1–1.0)
Monocytes Relative: 15 %
Neutro Abs: 67.6 10*3/uL — ABNORMAL HIGH (ref 1.7–7.7)
Neutrophils Relative %: 49 %
Platelets: 70 10*3/uL — ABNORMAL LOW (ref 150–400)
RBC: 2.64 MIL/uL — ABNORMAL LOW (ref 4.22–5.81)
RDW: 17.7 % — ABNORMAL HIGH (ref 11.5–15.5)
Smear Review: NORMAL
WBC: 135.1 10*3/uL (ref 4.0–10.5)
nRBC: 0.2 % (ref 0.0–0.2)

## 2021-01-07 LAB — COMPREHENSIVE METABOLIC PANEL
ALT: 40 U/L (ref 0–44)
AST: 49 U/L — ABNORMAL HIGH (ref 15–41)
Albumin: 3.7 g/dL (ref 3.5–5.0)
Alkaline Phosphatase: 51 U/L (ref 38–126)
Anion gap: 10 (ref 5–15)
BUN: 23 mg/dL (ref 8–23)
CO2: 24 mmol/L (ref 22–32)
Calcium: 8.8 mg/dL — ABNORMAL LOW (ref 8.9–10.3)
Chloride: 101 mmol/L (ref 98–111)
Creatinine, Ser: 1.45 mg/dL — ABNORMAL HIGH (ref 0.61–1.24)
GFR, Estimated: 45 mL/min — ABNORMAL LOW (ref >60)
Glucose, Bld: 263 mg/dL — ABNORMAL HIGH (ref 70–99)
Potassium: 3.2 mmol/L — ABNORMAL LOW (ref 3.5–5.1)
Sodium: 135 mmol/L (ref 135–145)
Total Bilirubin: 0.8 mg/dL (ref 0.3–1.2)
Total Protein: 7.1 g/dL (ref 6.5–8.1)

## 2021-01-07 LAB — IRON AND TIBC
Iron: 182 ug/dL (ref 45–182)
Saturation Ratios: 88 % — ABNORMAL HIGH (ref 17.9–39.5)
TIBC: 207 ug/dL — ABNORMAL LOW (ref 250–450)
UIBC: 25 ug/dL

## 2021-01-07 LAB — TYPE AND SCREEN
ABO/RH(D): B POS
Antibody Screen: NEGATIVE
Unit division: 0

## 2021-01-07 LAB — BPAM RBC
Blood Product Expiration Date: 202205262359
Unit Type and Rh: 7300

## 2021-01-07 LAB — FERRITIN: Ferritin: 2233 ng/mL — ABNORMAL HIGH (ref 24–336)

## 2021-01-07 NOTE — Progress Notes (Signed)
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 This was associated with bone marrow monocytosis and borderline number to slight increase in blastic cells. The overall changes favor a primary myeloid neoplasm, particularly a myelodysplastic syndrome especially refractory anemia with excess blasts (RAEB-1) or possibly refractory cytopenia with multilineage dysplasia. Consideration was also given to an evolving myelodysplastic/myeloproliferative neoplasm such as chronic myelomonocytic leukemia but the lack of absolute peripheral monocytosis precluded such a diagnosis at this time. Flow cytometrywas negative. Cytogeneticswere normal (46, XY). Foundation one was positive for ASXL1 Y814GY*18, EZH2 Splice site 563-1S>H, RUNX1 [id number]. IPSS-R4.5, intermediate group.  Patient received treatment with VidazaBetween March 2019 up until February 2021 when it was stopped as he was not having any significant response.  He was also seen by [facility] hematology Dr. Katina Dung.  Last seen by him in June 2021.  He has undergone multiple bone marrow's in the past with the last 1 being in June 2021 which revealed a hypercellular bone marrow with persistent morphologic dyspoiesis and 9% blasts.  Flow  cytometry at that time had showed for 6% circulating myeloblasts.  He was given the option of proceeding with Revlimid if blast count was less than 10% and considering venetoclax Vidaza if blast count was more than 10% by Dr. Garnette Scheuermann.  Patient had declined both of these options and was being monitored conservatively on Hydrea which she is presently taking 500 mg daily.  He could not tolerate higher doses due to diarrhea.  Patient had pancytopenia up until November 2021.  Since then there has been a persistent increase in his white cell count which went up to 100 in March 2022.  Hemoglobin at baseline is between 7-8 and he has been requiring weekly PRBC transfusions.  Platelet counts presently stable between 80s -100s.  Patient seen by Dr. Mike Gip up until now and now presents for transfer of care as she is no longer practicing with [facility]. Patient seen by Dr. Janese Banks who discussed concern for acute leukemia. Direction of care vs supportive care.   Interval history-patient and his wife return to clinic for evaluation and consideration of blood transfusion.  He underwent bone marrow biopsy yesterday at [facility].  Is scheduled to see MD on Friday at [facility] for treatment planning. Wife expresses concern that patient is increasingly weak and tired. Appetite reduced. They both express desire to consider additional treatment and work-up of possible leukemia at [facility] but have increasing reservations.  They are not interested in seeing palliative care at this time.  ECOG PS- 3 Pain scale- 0  Review of systems- Review of Systems  Constitutional: Positive for malaise/fatigue and weight loss. Negative for chills and fever.  HENT: Negative for congestion, ear discharge and nosebleeds.   Eyes: Negative for blurred vision.  Respiratory: Negative for cough, hemoptysis, sputum production, shortness of breath and wheezing.   Cardiovascular: Negative for chest pain, palpitations,  orthopnea and claudication.   Gastrointestinal: Negative for abdominal pain, blood in stool, constipation, diarrhea, heartburn, melena, nausea and vomiting.  Genitourinary: Negative for dysuria, flank pain, frequency, hematuria and urgency.  Musculoskeletal: Negative for back pain, joint pain and myalgias.  Skin: Negative for rash.  Neurological: Positive for weakness. Negative for dizziness, tingling, focal weakness, seizures and headaches.  Endo/Heme/Allergies: Does not bruise/bleed easily.  Psychiatric/Behavioral: Negative for depression and suicidal ideas. The patient does not have insomnia.      No Known Allergies   Past Medical History:  Diagnosis Date  . Anemia   . Arthritis   . BPH (benign prostatic hypertrophy)   . Cancer ([place])   . Complication of anesthesia    nausea  . Diabetes type 2, controlled ([place])   . HOH (hard of hearing)    r and L ears-70% loss per pt  . Hyperlipidemia   . Hypertension   . Myelodysplasia (myelodysplastic syndrome) ([place]) 2019     Past Surgical History:  Procedure Laterality Date  . APPENDECTOMY    . COLONOSCOPY  09/21/08   1 polyp found, tubular adenoma  . HAND SURGERY    . KNEE SURGERY Left     Social History   Socioeconomic History  . Marital status: Married    Spouse name: Not on file  . Number of children: Not on file  . Years of education: Not on file  . Highest education level: Not on file  Occupational History  . Not on file  Tobacco Use  . Smoking status: Never Smoker  . Smokeless tobacco: Never Used  Vaping Use  . Vaping Use: Never used  Substance and Sexual Activity  . Alcohol use: No    Alcohol/week: 0.0 standard drinks  . Drug use: No  . Sexual activity: Not on file  Other Topics Concern  . Not on file  Social History Narrative   Lives in private residence with wife   Social Determinants of Health   Financial Resource Strain: Not on file  Food Insecurity: Not on file  Transportation Needs: Not on file  Physical Activity: Not on  file  Stress: Not on file  Social Connections: Not on file  Intimate Partner Violence: Not on file   Immunization History  Administered Date(s) Administered  . Influenza Split 06/16/2013, 07/23/2014, 05/31/2015  . Influenza Whole 06/25/2010, 07/07/2012  . Influenza,inj,Quad PF,6+ Mos 06/28/2020  . Influenza-Unspecified 06/16/2013, 07/23/2014, 05/31/2015, 06/04/2016, 06/01/2017, 06/23/2018  . PFIZER(Purple Top)SARS-COV-2 Vaccination 09/06/2019, 09/27/2019  . Pneumococcal Conjugate-13 12/04/2015  . Pneumococcal Polysaccharide-23 09/01/2011  . Tdap 09/08/2018  . Zoster 06/25/2010   Family History  Problem Relation Age of Onset  . Aneurysm Mother   . Heart disease Father   . Cancer Brother        skin  . Heart disease Brother   . Heart disease Brother   . Cancer Sister        skin  . Aneurysm Brother   . Heart disease Brother   . Heart disease Sister   . Heart disease Sister   . COPD Sister   . Diabetes Sister     Current Outpatient Medications:  .  allopurinol (ZYLOPRIM) 300 MG tablet, Take 0.5 tablets (150 mg total) by mouth daily. (Patient taking differently: Take 150 mg by mouth daily. Advised by  [facility] to take 1 tablet per day), Disp: 30 tablet, Rfl: 0 .  aspirin 81 MG chewable tablet, Chew 81 mg by mouth daily., Disp: , Rfl:  .  Calcium  Carbonate-Vit D-Min (CALCIUM 1200 PO), Take 1,200 mg by mouth in the morning and at bedtime., Disp: , Rfl:  .  Cranberry (THERACRAN PO), Take 1 tablet by mouth daily. , Disp: , Rfl:  .  cyanocobalamin 1000 MCG tablet, Take 1,000 mcg by mouth daily., Disp: , Rfl:  .  finasteride (PROSCAR) 5 MG tablet, Take 5 mg by mouth daily., Disp: , Rfl:  .  glimepiride (AMARYL) 1 MG tablet, Take 1 tablet by mouth daily after breakfast., Disp: , Rfl:  .  glucose blood test strip, FreeStyle Lite Strips, Disp: , Rfl:  .  hydroxyurea (HYDREA) 500 MG capsule, Take 1 capsule (500 mg total) by mouth daily. May take with food to minimize GI side effects. (Patient  taking differently: Take 500 mg by mouth daily. Advised by [facility] to take 2 tablets per day), Disp: 30 capsule, Rfl: 0 .  LANCETS ULTRA FINE MISC, Lancets,Ultra Thin 26 gauge, Disp: , Rfl:  .  lisinopril (PRINIVIL,ZESTRIL) 10 MG tablet, Take 10 mg by mouth daily., Disp: , Rfl:  .  loperamide (IMODIUM) 2 MG capsule, Take 1 capsule (2 mg total) by mouth See admin instructions. With onset of loose stool, take 47m followed by 250mevery 2 hours until loose bowel movement stopped. Maximum: 16 mg/day, Disp: 30 capsule, Rfl: 0 .  meclizine (ANTIVERT) 25 MG tablet, Take 25 mg by mouth 2 (two) times daily as needed., Disp: , Rfl:  .  meloxicam (MOBIC) 7.5 MG tablet, Take 7.5 mg by mouth daily., Disp: , Rfl:  .  metformin (FORTAMET) 1000 MG (OSM) 24 hr tablet, Take 1,000 mg by mouth 2 (two) times daily with a meal., Disp: , Rfl:  .  Multiple Vitamins-Minerals (CENTRUM SILVER PO), Take by mouth., Disp: , Rfl:  .  mupirocin ointment (BACTROBAN) 2 %, Apply 1 application topically daily. With dressing changes, Disp: 22 g, Rfl: 0 .  Omega-3 Fatty Acids (FISH OIL PO), Take 1 tablet by mouth daily. , Disp: , Rfl:  .  ondansetron (ZOFRAN) 4 MG tablet, Take 1 tablet (4 mg total) by mouth every 6 (six) hours as needed for nausea or vomiting., Disp: 30 tablet, Rfl: 2 .  Polyethylene Glycol 3350 (MIRALAX PO), Take by mouth as needed. , Disp: , Rfl:  .  Saw Palmetto-Phytosterols (PROSTATE SR PO), Take 1 tablet by mouth daily. , Disp: , Rfl:  .  simvastatin (ZOCOR) 40 MG tablet, Take 40 mg by mouth daily., Disp: , Rfl:  No current facility-administered medications for this visit.  Facility-Administered Medications Ordered in Other Visits:  .  acetaminophen (TYLENOL) tablet 650 mg, 650 mg, Oral, Once, Corcoran, Melissa C, MD .  acetaminophen (TYLENOL) tablet 650 mg, 650 mg, Oral, Once, Corcoran, Melissa C, MD .  acetaminophen (TYLENOL) tablet 650 mg, 650 mg, Oral, Once, Corcoran, Melissa C, MD .  acetaminophen (TYLENOL)  tablet 650 mg, 650 mg, Oral, Once, Corcoran, Melissa C, MD .  acetaminophen (TYLENOL) tablet 650 mg, 650 mg, Oral, Once, Corcoran, Melissa C, MD .  diphenhydrAMINE (BENADRYL) capsule 25 mg, 25 mg, Oral, Once, Corcoran, Melissa C, MD .  diphenhydrAMINE (BENADRYL) capsule 25 mg, 25 mg, Oral, Once, Corcoran, Melissa C, MD .  diphenhydrAMINE (BENADRYL) capsule 25 mg, 25 mg, Oral, Once, Corcoran, Melissa C, MD .  diphenhydrAMINE (BENADRYL) capsule 25 mg, 25 mg, Oral, Once, Corcoran, Melissa C, MD .  diphenhydrAMINE (BENADRYL) capsule 25 mg, 25 mg, Oral, Once, CoLequita AsalMD  Physical exam:  Vitals:   01/07/21 [id number]  BP: (!) 129/41  Pulse: 81  Resp: 16  Temp: 97.9 F (36.6 C)  SpO2: 98%  Weight: 170 lb 3.1 oz (77.2 kg)   Physical Exam Constitutional:      Comments: Frail elderly gentleman who appears in no acute distress. Accompanied by wife  HENT:     Ears:     Comments: Significant hearing impairment. Hearing aids in place bilaterally. Eyes:     General:        Right eye: Discharge present.     Comments: Right conjunctiva reddened  Cardiovascular:     Rate and Rhythm: Normal rate and regular rhythm.     Heart sounds: Normal heart sounds.  Pulmonary:     Effort: Pulmonary effort is normal.     Breath sounds: Normal breath sounds.  Abdominal:     General: There is no distension.     Tenderness: There is no abdominal tenderness.  Musculoskeletal:     Comments: ambulatory  Skin:    General: Skin is warm and dry.     Coloration: Skin is pale.     Findings: Bruising present.  Neurological:     General: No focal deficit present.     Mental Status: He is alert and oriented to person, place, and time.  Psychiatric:        Mood and Affect: Mood normal.        Behavior: Behavior normal.      CMP Latest Ref Rng & Units 12/31/2020  Glucose 70 - 99 mg/dL 260(H)  BUN 8 - 23 mg/dL 23  Creatinine 0.61 - 1.24 mg/dL 1.32(H)  Sodium 135 - 145 mmol/L 133(L)  Potassium 3.5 - 5.1  mmol/L 3.5  Chloride 98 - 111 mmol/L 100  CO2 22 - 32 mmol/L 26  Calcium 8.9 - 10.3 mg/dL 8.9  Total Protein 6.5 - 8.1 g/dL 6.9  Total Bilirubin 0.3 - 1.2 mg/dL 0.5  Alkaline Phos 38 - 126 U/L 48  AST 15 - 41 U/L 44(H)  ALT 0 - 44 U/L 39   CBC Latest Ref Rng & Units 01/07/2021  WBC 4.0 - 10.5 K/uL 135.1(HH)  Hemoglobin 13.0 - 17.0 g/dL 7.1(L)  Hematocrit 39.0 - 52.0 % 22.1(L)  Platelets 150 - 400 K/uL 70(L)     Assessment and plan- 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 MDS- RAEB-1 s/p complete response with azacitidine progressed and February 2021, currently on Hydrea 1000 mg daily.  Increasingly symptomatic with increasing WBCs with likely new mutation.  Concern for progression to acute leukemia.  Seen at [facility] by Dr. Janese Banks.  Patient expressed desire for treatment.  Seen by Dr. Katina Dung at [facility]. He is now s/p bone marrow biopsy on 01/06/21. Awaiting results and treatment planning at [facility] later this week.  Again reviewed that he will likely require hospitalization for chemotherapy which will be at [facility].  If he is able to tolerate cycle 1, could consider giving further cycles locally.  [facility] will direct his care for now and patient can return to local cancer center in the future if needed.   Continue allopurinol 300 mg daily.   2. Anemia- likely secondary to acute on chronic process. No symptom improvement with transfusion last week. Hemoglobin 7.1. Hold transfusion today. Reviewed that transfusion can increase hyperviscosity if there is progression to AML and we would avoid transfusions as much as possible.  Consider hemoglobin < 7.   3.  Goals of care-again discussed that the prognosis with treatment is  poor. I recommended meeting with palliative care which was declined. They may consider hospice vs hospitalization & treatment at [facility].   Plan:  No further appointments scheduled today. They will call back once they have been seen at [facility] and let us know if we can schedule  additional supportive care.   I discussed the assessment and treatment plan with the patient. The patient was provided an opportunity to ask questions and all were answered. The patient agreed with the plan and demonstrated an understanding of the instructions.   The patient was advised to call back or seek an in-person evaluation if the symptoms worsen or if the condition fails to improve as anticipated.   I spent 30 minutes face-to-face video visit time dedicated to the care of this patient on the date of this encounter to include pre-visit review of bone marrow biopsy preliminary results, face-to-face time with the patient, and post visit ordering of testing/documentation.    Visit Diagnosis 1. MDS (myelodysplastic syndrome) ([place])   2. Symptomatic anemia   3. Goals of care, counseling/discussion     Beckey Rutter, [place], AGNP-C [place] at [facility] [phone] (clinic)

## 2021-01-08 ENCOUNTER — Inpatient Hospital Stay: Payer: Medicare Other | Admitting: Hospice and Palliative Medicine

## 2021-01-08 LAB — PREPARE RBC (CROSSMATCH)

## 2021-01-13 ENCOUNTER — Telehealth: Payer: Self-pay | Admitting: *Deleted

## 2021-01-13 NOTE — Telephone Encounter (Signed)
Inis Sizer from [facility] called requesting to speak with Lauren regarding this patient treatment. Please return her call [phone] and have her paged

## 2021-01-13 NOTE — Telephone Encounter (Signed)
Returned call. Not available. Provided my cell number to her receptionist.

## 2021-01-14 ENCOUNTER — Telehealth: Payer: Self-pay

## 2021-01-14 NOTE — Telephone Encounter (Signed)
-----   Message from Paul Au, NP sent at 01/14/2021 10:05 AM EDT ----- Regarding: RE: Paul Pham I left voicemail for [facility] provider yesterday but haven't yet heard back.  Paul Pham   ----- Message ----- From: Cephus Richer Sent: 01/14/2021   8:00 AM EDT To: Theodosia Blender, RN, Oretha Ellis, RN, # Subject: [place]                                          Pt wife wants to talk to someone about husband getting treatment. [facility] has decided to get [facility] at [facility]. The MD from [facility] tried to reach [facility] yesterday but of cause we were closed. She will bring information today. From my understanding pt needs transfusion before getting treated at [facility]..   Thanks

## 2021-01-15 ENCOUNTER — Other Ambulatory Visit: Payer: Self-pay | Admitting: Nurse Practitioner

## 2021-01-15 ENCOUNTER — Other Ambulatory Visit: Payer: Self-pay

## 2021-01-15 ENCOUNTER — Other Ambulatory Visit: Payer: Self-pay | Admitting: Oncology

## 2021-01-15 DIAGNOSIS — C9200 Acute myeloblastic leukemia, not having achieved remission: Secondary | ICD-10-CM

## 2021-01-15 DIAGNOSIS — D469 Myelodysplastic syndrome, unspecified: Secondary | ICD-10-CM

## 2021-01-15 DIAGNOSIS — Z7189 Other specified counseling: Secondary | ICD-10-CM

## 2021-01-15 MED ORDER — PROCHLORPERAZINE MALEATE 10 MG PO TABS: 10.0000 mg | ORAL_TABLET | Freq: Four times a day (QID) | ORAL | 1 refills | Status: DC | PRN

## 2021-01-15 MED ORDER — ALLOPURINOL 300 MG PO TABS: 300.0000 mg | ORAL_TABLET | Freq: Every day | ORAL | 2 refills | Status: AC

## 2021-01-15 MED ORDER — ONDANSETRON HCL 8 MG PO TABS
8.0000 mg | ORAL_TABLET | Freq: Two times a day (BID) | ORAL | 1 refills | Status: AC | PRN
Start: 2021-01-15 — End: ?

## 2021-01-15 MED ORDER — LIDOCAINE-PRILOCAINE 2.5-2.5 % EX CREA
TOPICAL_CREAM | CUTANEOUS | 3 refills | Status: AC
Start: 2021-01-15 — End: ?

## 2021-01-15 NOTE — Progress Notes (Signed)
Spoke to [facility]. Bone Marrow confirmed acute leukemia. Plan for dacogen locally for ten days. Paul Pham to speak to Dr. Lucianne Lei to confirm dosing. Patient will then receive care at [facility].

## 2021-01-15 NOTE — Progress Notes (Signed)
Spoke to patient's son due to request of patient and his wife. Patient seen at [facility].  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 Plan for 2 cycles. Dr. Janese Banks updated and she has entered treatment plan. He will start on 5/23 and will see Sonia Baller for labs and initiation of treatment. He will need labs weekly and possibly more frequently. Will likely need transfusions per Dr. Katina Dung. Treat through neutropenia but if counts drop significantly to let him know. Will send message to get appointments scheduled. Patient prefers appointments in [place] when possible.

## 2021-01-16 ENCOUNTER — Other Ambulatory Visit: Payer: Self-pay

## 2021-01-16 ENCOUNTER — Inpatient Hospital Stay: Payer: Medicare Other

## 2021-01-16 ENCOUNTER — Other Ambulatory Visit: Payer: Self-pay | Admitting: Oncology

## 2021-01-16 DIAGNOSIS — D469 Myelodysplastic syndrome, unspecified: Secondary | ICD-10-CM

## 2021-01-16 DIAGNOSIS — Z7189 Other specified counseling: Secondary | ICD-10-CM

## 2021-01-16 DIAGNOSIS — C9210 Chronic myeloid leukemia, BCR/ABL-positive, not having achieved remission: Secondary | ICD-10-CM

## 2021-01-16 DIAGNOSIS — C9200 Acute myeloblastic leukemia, not having achieved remission: Secondary | ICD-10-CM

## 2021-01-16 DIAGNOSIS — Z5111 Encounter for antineoplastic chemotherapy: Secondary | ICD-10-CM | POA: Diagnosis not present

## 2021-01-16 DIAGNOSIS — D649 Anemia, unspecified: Secondary | ICD-10-CM

## 2021-01-16 LAB — COMPREHENSIVE METABOLIC PANEL
ALT: 40 U/L (ref 0–44)
AST: 44 U/L — ABNORMAL HIGH (ref 15–41)
Albumin: 3.6 g/dL (ref 3.5–5.0)
Alkaline Phosphatase: 49 U/L (ref 38–126)
Anion gap: 8 (ref 5–15)
BUN: 17 mg/dL (ref 8–23)
CO2: 26 mmol/L (ref 22–32)
Calcium: 8.8 mg/dL — ABNORMAL LOW (ref 8.9–10.3)
Chloride: 99 mmol/L (ref 98–111)
Creatinine, Ser: 1.33 mg/dL — ABNORMAL HIGH (ref 0.61–1.24)
GFR, Estimated: 50 mL/min — ABNORMAL LOW (ref >60)
Glucose, Bld: 277 mg/dL — ABNORMAL HIGH (ref 70–99)
Potassium: 3.1 mmol/L — ABNORMAL LOW (ref 3.5–5.1)
Sodium: 133 mmol/L — ABNORMAL LOW (ref 135–145)
Total Bilirubin: 0.9 mg/dL (ref 0.3–1.2)
Total Protein: 7 g/dL (ref 6.5–8.1)

## 2021-01-16 LAB — CBC WITH DIFFERENTIAL/PLATELET
Abs Immature Granulocytes: 31.02 10*3/uL — ABNORMAL HIGH (ref 0.00–0.07)
Basophils Absolute: 0.3 10*3/uL — ABNORMAL HIGH (ref 0.0–0.1)
Basophils Relative: 0 %
Eosinophils Absolute: 0.8 10*3/uL — ABNORMAL HIGH (ref 0.0–0.5)
Eosinophils Relative: 1 %
HCT: 19.1 % — ABNORMAL LOW (ref 39.0–52.0)
Hemoglobin: 5.8 g/dL — ABNORMAL LOW (ref 13.0–17.0)
Immature Granulocytes: 25 %
Lymphocytes Relative: 14 %
Lymphs Abs: 17.9 10*3/uL — ABNORMAL HIGH (ref 0.7–4.0)
MCH: 26.2 pg (ref 26.0–34.0)
MCHC: 30.4 g/dL (ref 30.0–36.0)
MCV: 86.4 fL (ref 80.0–100.0)
Monocytes Absolute: 17.7 10*3/uL — ABNORMAL HIGH (ref 0.1–1.0)
Monocytes Relative: 14 %
Neutro Abs: 57.8 10*3/uL — ABNORMAL HIGH (ref 1.7–7.7)
Neutrophils Relative %: 46 %
Platelets: 86 10*3/uL — ABNORMAL LOW (ref 150–400)
RBC Morphology: NONE SEEN
RBC: 2.21 MIL/uL — ABNORMAL LOW (ref 4.22–5.81)
RDW: 18.7 % — ABNORMAL HIGH (ref 11.5–15.5)
WBC: 125.5 10*3/uL (ref 4.0–10.5)
nRBC: 0.5 % — ABNORMAL HIGH (ref 0.0–0.2)

## 2021-01-16 LAB — SAMPLE TO BLOOD BANK

## 2021-01-16 LAB — PREPARE RBC (CROSSMATCH)

## 2021-01-16 MED ORDER — DIPHENHYDRAMINE HCL 25 MG PO CAPS: 25.0000 mg | ORAL_CAPSULE | Freq: Once | ORAL | Status: DC

## 2021-01-16 MED ORDER — ACETAMINOPHEN 325 MG PO TABS
650.0000 mg | ORAL_TABLET | Freq: Once | ORAL | Status: DC
Start: 2021-01-16 — End: 2021-01-16

## 2021-01-16 MED ORDER — ACETAMINOPHEN 325 MG PO TABS: 650.0000 mg | ORAL_TABLET | Freq: Once | ORAL | Status: DC

## 2021-01-16 MED ORDER — SODIUM CHLORIDE 0.9% IV SOLUTION
250.0000 mL | Freq: Once | INTRAVENOUS | Status: DC
  Filled 2021-01-16: qty 250

## 2021-01-16 MED ORDER — SODIUM CHLORIDE 0.9 % IV SOLN
INTRAVENOUS | Status: DC
  Filled 2021-01-16: qty 250

## 2021-01-16 NOTE — Patient Instructions (Signed)

## 2021-01-19 DIAGNOSIS — Z5111 Encounter for antineoplastic chemotherapy: Secondary | ICD-10-CM | POA: Diagnosis not present

## 2021-01-20 ENCOUNTER — Inpatient Hospital Stay: Payer: Medicare Other

## 2021-01-20 ENCOUNTER — Other Ambulatory Visit: Payer: Self-pay | Admitting: *Deleted

## 2021-01-20 ENCOUNTER — Other Ambulatory Visit: Payer: Self-pay

## 2021-01-20 ENCOUNTER — Encounter: Payer: Self-pay | Admitting: Oncology

## 2021-01-20 ENCOUNTER — Inpatient Hospital Stay (HOSPITAL_BASED_OUTPATIENT_CLINIC_OR_DEPARTMENT_OTHER): Payer: Medicare Other | Admitting: Oncology

## 2021-01-20 VITALS — BP 121/44 | HR 88 | Temp 97.1°F | Resp 18

## 2021-01-20 VITALS — BP 143/50 | HR 93 | Temp 97.5°F | Resp 18 | Wt 166.9 lb

## 2021-01-20 DIAGNOSIS — C9200 Acute myeloblastic leukemia, not having achieved remission: Secondary | ICD-10-CM

## 2021-01-20 DIAGNOSIS — D649 Anemia, unspecified: Secondary | ICD-10-CM

## 2021-01-20 DIAGNOSIS — Z5111 Encounter for antineoplastic chemotherapy: Secondary | ICD-10-CM | POA: Insufficient documentation

## 2021-01-20 DIAGNOSIS — Z7189 Other specified counseling: Secondary | ICD-10-CM

## 2021-01-20 DIAGNOSIS — D469 Myelodysplastic syndrome, unspecified: Secondary | ICD-10-CM

## 2021-01-20 LAB — TYPE AND SCREEN
ABO/RH(D): B POS
Antibody Screen: NEGATIVE
Unit division: 0

## 2021-01-20 LAB — BPAM RBC
Blood Product Expiration Date: 202205252359
ISSUE DATE / TIME: 202205191215
Unit Type and Rh: 9500

## 2021-01-20 LAB — COMPREHENSIVE METABOLIC PANEL
ALT: 40 U/L (ref 0–44)
AST: 47 U/L — ABNORMAL HIGH (ref 15–41)
Albumin: 3.9 g/dL (ref 3.5–5.0)
Alkaline Phosphatase: 57 U/L (ref 38–126)
Anion gap: 13 (ref 5–15)
BUN: 16 mg/dL (ref 8–23)
CO2: 24 mmol/L (ref 22–32)
Calcium: 9 mg/dL (ref 8.9–10.3)
Chloride: 99 mmol/L (ref 98–111)
Creatinine, Ser: 1.29 mg/dL — ABNORMAL HIGH (ref 0.61–1.24)
GFR, Estimated: 52 mL/min — ABNORMAL LOW (ref >60)
Glucose, Bld: 228 mg/dL — ABNORMAL HIGH (ref 70–99)
Potassium: 3.3 mmol/L — ABNORMAL LOW (ref 3.5–5.1)
Sodium: 136 mmol/L (ref 135–145)
Total Bilirubin: 0.9 mg/dL (ref 0.3–1.2)
Total Protein: 7.1 g/dL (ref 6.5–8.1)

## 2021-01-20 LAB — CBC WITH DIFFERENTIAL/PLATELET
Abs Immature Granulocytes: 43.01 10*3/uL — ABNORMAL HIGH (ref 0.00–0.07)
Basophils Absolute: 0.5 10*3/uL — ABNORMAL HIGH (ref 0.0–0.1)
Basophils Relative: 0 %
Eosinophils Absolute: 1 10*3/uL — ABNORMAL HIGH (ref 0.0–0.5)
Eosinophils Relative: 1 %
HCT: 20.8 % — ABNORMAL LOW (ref 39.0–52.0)
Hemoglobin: 6.7 g/dL — ABNORMAL LOW (ref 13.0–17.0)
Immature Granulocytes: 26 %
Lymphocytes Relative: 4 %
Lymphs Abs: 6.5 10*3/uL — ABNORMAL HIGH (ref 0.7–4.0)
MCH: 27.9 pg (ref 26.0–34.0)
MCHC: 32.2 g/dL (ref 30.0–36.0)
MCV: 86.7 fL (ref 80.0–100.0)
Monocytes Absolute: 46.4 10*3/uL — ABNORMAL HIGH (ref 0.1–1.0)
Monocytes Relative: 28 %
Neutro Abs: 69.1 10*3/uL — ABNORMAL HIGH (ref 1.7–7.7)
Neutrophils Relative %: 41 %
Platelets: 98 10*3/uL — ABNORMAL LOW (ref 150–400)
RBC: 2.4 MIL/uL — ABNORMAL LOW (ref 4.22–5.81)
RDW: 18.4 % — ABNORMAL HIGH (ref 11.5–15.5)
Smear Review: NORMAL
WBC: 166.5 10*3/uL (ref 4.0–10.5)
nRBC: 0.5 % — ABNORMAL HIGH (ref 0.0–0.2)

## 2021-01-20 LAB — PREPARE RBC (CROSSMATCH)

## 2021-01-20 MED ORDER — PROCHLORPERAZINE MALEATE 10 MG PO TABS
10.0000 mg | ORAL_TABLET | Freq: Once | ORAL | Status: AC
  Administered 2021-01-20: 10 mg via ORAL
  Filled 2021-01-20: qty 1

## 2021-01-20 MED ORDER — SODIUM CHLORIDE 0.9 % IV SOLN
Freq: Once | INTRAVENOUS | Status: AC
  Filled 2021-01-20: qty 250

## 2021-01-20 MED ORDER — SODIUM CHLORIDE 0.9% IV SOLUTION
250.0000 mL | Freq: Once | INTRAVENOUS | Status: AC
  Administered 2021-01-20: 250 mL via INTRAVENOUS
  Filled 2021-01-20: qty 250

## 2021-01-20 MED ORDER — SODIUM CHLORIDE 0.9 % IV SOLN
20.0000 mg/m2 | Freq: Once | INTRAVENOUS | Status: AC
  Administered 2021-01-20: 40 mg via INTRAVENOUS
  Filled 2021-01-20: qty 8

## 2021-01-20 NOTE — Progress Notes (Signed)
Hematology/Oncology Consult note [facility]  Telephone:([phone] [phone]  Patient Care Team: Lequita Asal, MD as PCP - General (Hematology and Oncology) Lequita Asal, MD as PCP - Hematology/Oncology (Oncology)   Name of the patient: Paul Pham  [id number]  09/24/1925   Date of visit: 01/20/21  Diagnosis-acute myeloid leukemia  Chief complaint/ Reason for visit-on treatment assessment prior to cycle 1 dose 1 of Dacogen  Heme/Onc history:Patient is a 87 year old male diagnosed with MDS RAEB 1.Bone marrowon 09/22/2017 revealed a hypercellular for age with dyspoietic changes variably involving myeloid cell lines, but with main involvement of the granulocytic/monocytic cell line. This was associated with bone marrow monocytosis and borderline number to slight increase in blastic cells. The overall changes favor a primary myeloid neoplasm, particularly a myelodysplastic syndrome especially refractory anemia with excess blasts (RAEB-1) or possibly refractory cytopenia with multilineage dysplasia. Consideration was also given to an evolving myelodysplastic/myeloproliferative neoplasm such as chronic myelomonocytic leukemia but the lack of absolute peripheral monocytosis precluded such a diagnosis at this time. Flow cytometrywas negative. Cytogeneticswere normal (46, XY). Foundation one was positive for ASXL1 V564PP*29, EZH2 Splice site 518-8C>Z, RUNX1 [id number]. IPSS-R4.5, intermediate group.  Patient received treatment with VidazaBetween March 2019 up until February 2021 when it was stopped as he was not having any significant response.  He was also seen by [facility] hematology Dr. Katina Dung.  Last seen by him in June 2021.  He has undergone multiple bone marrow's in the past with the last 1 being in June 2021 which revealed a hypercellular bone marrow with persistent morphologic dyspoiesis and 9% blasts.  Flow cytometry at that time had  showed for 6% circulating myeloblasts.  He was given the option of proceeding with Revlimid if blast count was less than 10% and considering venetoclax Vidaza if blast count was more than 10% by Dr. Garnette Scheuermann.  Patient had declined both of these options and was being monitored conservatively on Hydrea which she is presently taking 500 mg daily.  He could not tolerate higher doses due to diarrhea.  Patient had pancytopenia up until November 2021.  Since then there has been a persistent increase in his white cell count which went up to 100 in March 2022.  Hemoglobin at baseline is between 7-8 and he has been requiring weekly PRBC transfusions.  Platelet counts presently stable between 80s -100s.  Patient seen by Dr. Mike Pham up until now and now presents for transfer of care.  Given the concern for hyperleukocytosis and ongoing anemia patient was referred to [facility] and underwent Bone marrow biopsy there which showed acute myeloid leukemia with 22% blasts in the bone marrow.  Marrow was more than 95% hypercellular.  Plan was to proceed with 10 days of Dacogen 2 weeks on 2 weeks off  Interval history-despite his high white blood cell count patient is doing well and other than ongoing fatigue and occasional exertional shortness of breath he denies any specific complaints at this time.  He is here with his wife today  ECOG PS- 2 Pain scale- 0   Review of systems- Review of Systems  Constitutional: Positive for malaise/fatigue. Negative for chills, fever and weight loss.  HENT: Negative for congestion, ear discharge and nosebleeds.   Eyes: Negative for blurred vision.  Respiratory: Positive for shortness of breath. Negative for cough, hemoptysis, sputum production and wheezing.   Cardiovascular: Negative for chest pain, palpitations, orthopnea and claudication.  Gastrointestinal: Negative for abdominal pain, blood in stool, constipation,  diarrhea, heartburn, melena, nausea and vomiting.  Genitourinary:  Negative for dysuria, flank pain, frequency, hematuria and urgency.  Musculoskeletal: Negative for back pain, joint pain and myalgias.  Skin: Negative for rash.  Neurological: Negative for dizziness, tingling, focal weakness, seizures, weakness and headaches.  Endo/Heme/Allergies: Does not bruise/bleed easily.  Psychiatric/Behavioral: Negative for depression and suicidal ideas. The patient does not have insomnia.       No Known Allergies   Past Medical History:  Diagnosis Date  . Anemia   . Arthritis   . BPH (benign prostatic hypertrophy)   . Cancer ([place])   . Complication of anesthesia    nausea  . Diabetes type 2, controlled ([place])   . HOH (hard of hearing)    r and L ears-70% loss per pt  . Hyperlipidemia   . Hypertension   . Myelodysplasia (myelodysplastic syndrome) ([place]) 2019     Past Surgical History:  Procedure Laterality Date  . APPENDECTOMY    . COLONOSCOPY  09/21/08   1 polyp found, tubular adenoma  . HAND SURGERY    . KNEE SURGERY Left     Social History   Socioeconomic History  . Marital status: Married    Spouse name: Not on file  . Number of children: Not on file  . Years of education: Not on file  . Highest education level: Not on file  Occupational History  . Not on file  Tobacco Use  . Smoking status: Never Smoker  . Smokeless tobacco: Never Used  Vaping Use  . Vaping Use: Never used  Substance and Sexual Activity  . Alcohol use: No    Alcohol/week: 0.0 standard drinks  . Drug use: No  . Sexual activity: Not on file  Other Topics Concern  . Not on file  Social History Narrative   Lives in private residence with wife   Social Determinants of Health   Financial Resource Strain: Not on file  Food Insecurity: Not on file  Transportation Needs: Not on file  Physical Activity: Not on file  Stress: Not on file  Social Connections: Not on file  Intimate Partner Violence: Not on file    Family History  Problem Relation Age of Onset  .  Aneurysm Mother   . Heart disease Father   . Cancer Brother        skin  . Heart disease Brother   . Heart disease Brother   . Cancer Sister        skin  . Aneurysm Brother   . Heart disease Brother   . Heart disease Sister   . Heart disease Sister   . COPD Sister   . Diabetes Sister      Current Outpatient Medications:  .  allopurinol (ZYLOPRIM) 300 MG tablet, Take 0.5 tablets (150 mg total) by mouth daily. (Patient taking differently: Take 150 mg by mouth daily. Advised by [facility] to take 1 tablet per day), Disp: 30 tablet, Rfl: 0 .  allopurinol (ZYLOPRIM) 300 MG tablet, Take 1 tablet (300 mg total) by mouth daily., Disp: 30 tablet, Rfl: 2 .  aspirin 81 MG chewable tablet, Chew 81 mg by mouth daily., Disp: , Rfl:  .  Calcium Carbonate-Vit D-Min (CALCIUM 1200 PO), Take 1,200 mg by mouth in the morning and at bedtime., Disp: , Rfl:  .  Cranberry (THERACRAN PO), Take 1 tablet by mouth daily. , Disp: , Rfl:  .  cyanocobalamin 1000 MCG tablet, Take 1,000 mcg by mouth daily., Disp: , Rfl:  .  finasteride (PROSCAR) 5 MG tablet, Take 5 mg by mouth daily., Disp: , Rfl:  .  glucose blood test strip, FreeStyle Lite Strips, Disp: , Rfl:  .  LANCETS ULTRA FINE MISC, Lancets,Ultra Thin 26 gauge, Disp: , Rfl:  .  lidocaine-prilocaine (EMLA) cream, Apply to affected area once, Disp: 30 g, Rfl: 3 .  lisinopril (PRINIVIL,ZESTRIL) 10 MG tablet, Take 10 mg by mouth daily., Disp: , Rfl:  .  loperamide (IMODIUM) 2 MG capsule, Take 1 capsule (2 mg total) by mouth See admin instructions. With onset of loose stool, take 4mg followed by 2mg every 2 hours until loose bowel movement stopped. Maximum: 16 mg/day, Disp: 30 capsule, Rfl: 0 .  meclizine (ANTIVERT) 25 MG tablet, Take 25 mg by mouth 2 (two) times daily as needed., Disp: , Rfl:  .  meloxicam (MOBIC) 7.5 MG tablet, Take 7.5 mg by mouth daily., Disp: , Rfl:  .  metformin (FORTAMET) 1000 MG (OSM) 24 hr tablet, Take 1,000 mg by mouth 2 (two) times daily with  a meal., Disp: , Rfl:  .  Multiple Vitamins-Minerals (CENTRUM SILVER PO), Take by mouth., Disp: , Rfl:  .  mupirocin ointment (BACTROBAN) 2 %, Apply 1 application topically daily. With dressing changes, Disp: 22 g, Rfl: 0 .  Omega-3 Fatty Acids (FISH OIL PO), Take 1 tablet by mouth daily. , Disp: , Rfl:  .  ondansetron (ZOFRAN) 4 MG tablet, Take 1 tablet (4 mg total) by mouth every 6 (six) hours as needed for nausea or vomiting., Disp: 30 tablet, Rfl: 2 .  ondansetron (ZOFRAN) 8 MG tablet, Take 1 tablet (8 mg total) by mouth 2 (two) times daily as needed (Nausea or vomiting)., Disp: 30 tablet, Rfl: 1 .  Polyethylene Glycol 3350 (MIRALAX PO), Take by mouth as needed. , Disp: , Rfl:  .  prochlorperazine (COMPAZINE) 10 MG tablet, Take 1 tablet (10 mg total) by mouth every 6 (six) hours as needed (Nausea or vomiting)., Disp: 30 tablet, Rfl: 1 .  Saw Palmetto-Phytosterols (PROSTATE SR PO), Take 1 tablet by mouth daily. , Disp: , Rfl:  .  simvastatin (ZOCOR) 40 MG tablet, Take 40 mg by mouth daily., Disp: , Rfl:  .  glimepiride (AMARYL) 1 MG tablet, Take 1 tablet by mouth daily after breakfast., Disp: , Rfl:  No current facility-administered medications for this visit.  Facility-Administered Medications Ordered in Other Visits:  .  acetaminophen (TYLENOL) tablet 650 mg, 650 mg, Oral, Once, Corcoran, Melissa C, MD .  acetaminophen (TYLENOL) tablet 650 mg, 650 mg, Oral, Once, Corcoran, Melissa C, MD .  acetaminophen (TYLENOL) tablet 650 mg, 650 mg, Oral, Once, Corcoran, Melissa C, MD .  acetaminophen (TYLENOL) tablet 650 mg, 650 mg, Oral, Once, Corcoran, Melissa C, MD .  acetaminophen (TYLENOL) tablet 650 mg, 650 mg, Oral, Once, Corcoran, Melissa C, MD .  diphenhydrAMINE (BENADRYL) capsule 25 mg, 25 mg, Oral, Once, Corcoran, Melissa C, MD .  diphenhydrAMINE (BENADRYL) capsule 25 mg, 25 mg, Oral, Once, Corcoran, Melissa C, MD .  diphenhydrAMINE (BENADRYL) capsule 25 mg, 25 mg, Oral, Once, Corcoran,  Melissa C, MD .  diphenhydrAMINE (BENADRYL) capsule 25 mg, 25 mg, Oral, Once, Corcoran, Melissa C, MD .  diphenhydrAMINE (BENADRYL) capsule 25 mg, 25 mg, Oral, Once, Corcoran, Melissa C, MD  Physical exam:  Vitals:   01/20/21 0902  BP: (!) 143/50  Pulse: 93  Resp: 18  Temp: (!) 97.5 F (36.4 C)  SpO2: 97%  Weight: 166 lb 14.4 oz (75.7 kg)     Physical Exam Constitutional:      Comments: Elderly gentleman who appears in no acute distress.  He is hard of hearing  Cardiovascular:     Rate and Rhythm: Normal rate and regular rhythm.     Heart sounds: Normal heart sounds.  Pulmonary:     Effort: Pulmonary effort is normal.     Breath sounds: Normal breath sounds.  Skin:    General: Skin is warm and dry.  Neurological:     Mental Status: He is alert and oriented to person, place, and time.      CMP Latest Ref Rng & Units 01/20/2021  Glucose 70 - 99 mg/dL 228(H)  BUN 8 - 23 mg/dL 16  Creatinine 0.61 - 1.24 mg/dL 1.29(H)  Sodium 135 - 145 mmol/L 136  Potassium 3.5 - 5.1 mmol/L 3.3(L)  Chloride 98 - 111 mmol/L 99  CO2 22 - 32 mmol/L 24  Calcium 8.9 - 10.3 mg/dL 9.0  Total Protein 6.5 - 8.1 g/dL 7.1  Total Bilirubin 0.3 - 1.2 mg/dL 0.9  Alkaline Phos 38 - 126 U/L 57  AST 15 - 41 U/L 47(H)  ALT 0 - 44 U/L 40   CBC Latest Ref Rng & Units 01/20/2021  WBC 4.0 - 10.5 K/uL 166.5(HH)  Hemoglobin 13.0 - 17.0 g/dL 6.7(L)  Hematocrit 39.0 - 52.0 % 20.8(L)  Platelets 150 - 400 K/uL 98(L)     Assessment and plan- Patient is a 87 y.o. 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  This is high risk AML since he has transformed from prior MDS.Patient is already received Vidaza in the past which she did not respond to and plan for [facility] Dr. Garnette Scheuermann was to proceed with Dacogen.  He will proceed with  cycle 1 starting this week Monday to Friday and then again Monday to Friday next week following which she will get 2 weeks off  Discussed risks and benefits of chemotherapy including all but not limited to nausea, vomiting, low blood counts, risk of infections and hospitalizations.  I would like to start him on Valtrex prophylaxis along with Levaquin.  He will be seen on a weekly basis with labs and possible transfusion.  He will be getting 1 unit of PRBC today  I will see him back in 4 weeks for cycle 2.   Visit Diagnosis 1. Symptomatic anemia   2. 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[place])   3. Encounter for antineoplastic chemotherapy      Dr. Randa Evens, MD, MPH [facility] [phone] 01/20/2021 3:48 PM

## 2021-01-20 NOTE — Progress Notes (Signed)
hgb 6.7, plt 98 ok to treat per MD

## 2021-01-20 NOTE — Progress Notes (Signed)
Patient feel weak today and very hot

## 2021-01-20 NOTE — Patient Instructions (Signed)
CANCER CENTER [place] [facility]  Discharge Instructions: Thank you for choosing [facility] to provide your oncology and hematology care.  If you have a lab appointment with the Cancer Center, please go directly to the Cancer Center and check in at the registration area.  Wear comfortable clothing and clothing appropriate for easy access to any Portacath or PICC line.   We strive to give you quality time with your provider. You may need to reschedule your appointment if you arrive late (15 or more minutes).  Arriving late affects you and other patients whose appointments are after yours.  Also, if you miss three or more appointments without notifying the office, you may be dismissed from the clinic at the provider's discretion.      For prescription refill requests, have your pharmacy contact our office and allow 72 hours for refills to be completed.      To help prevent nausea and vomiting after your treatment, we encourage you to take your nausea medication as directed.  BELOW ARE SYMPTOMS THAT SHOULD BE REPORTED IMMEDIATELY: *FEVER GREATER THAN 100.4 F (38 C) OR HIGHER *CHILLS OR SWEATING *NAUSEA AND VOMITING THAT IS NOT CONTROLLED WITH YOUR NAUSEA MEDICATION *UNUSUAL SHORTNESS OF BREATH *UNUSUAL BRUISING OR BLEEDING *URINARY PROBLEMS (pain or burning when urinating, or frequent urination) *BOWEL PROBLEMS (unusual diarrhea, constipation, pain near the anus) TENDERNESS IN MOUTH AND THROAT WITH OR WITHOUT PRESENCE OF ULCERS (sore throat, sores in mouth, or a toothache) UNUSUAL RASH, SWELLING OR PAIN  UNUSUAL VAGINAL DISCHARGE OR ITCHING   Items with * indicate a potential emergency and should be followed up as soon as possible or go to the Emergency Department if any problems should occur.  Please show the CHEMOTHERAPY ALERT CARD or IMMUNOTHERAPY ALERT CARD at check-in to the Emergency Department and triage nurse.  Should you have questions after your  visit or need to cancel or reschedule your appointment, please contact CANCER CENTER [place] [facility]  [phone] and follow the prompts.  Office hours are 8:00 a.m. to 4:30 p.m. Monday - Friday. Please note that voicemails left after 4:00 p.m. may not be returned until the following business day.  We are closed weekends and major holidays. You have access to a nurse at all times for urgent questions. Please call the main number to the clinic [phone] and follow the prompts.  For any non-urgent questions, you may also contact your provider using MyChart. We now offer e-Visits for anyone 18 and older to request care online for non-urgent symptoms. For details visit [email or web address].   Also download the MyChart app! Go to the app store, search "MyChart", open the app, select [place], and log in with your MyChart username and password.  Due to Covid, a mask is required upon entering the hospital/clinic. If you do not have a mask, one will be given to you upon arrival. For doctor visits, patients may have 1 support person aged 18 or older with them. For treatment visits, patients cannot have anyone with them due to current Covid guidelines and our immunocompromised population.  

## 2021-01-21 ENCOUNTER — Other Ambulatory Visit: Payer: Self-pay | Admitting: Oncology

## 2021-01-21 ENCOUNTER — Other Ambulatory Visit: Payer: Self-pay

## 2021-01-21 ENCOUNTER — Inpatient Hospital Stay: Payer: Medicare Other

## 2021-01-21 VITALS — BP 127/56 | HR 90 | Temp 97.0°F | Resp 18

## 2021-01-21 DIAGNOSIS — R6520 Severe sepsis without septic shock: Secondary | ICD-10-CM | POA: Diagnosis present

## 2021-01-21 DIAGNOSIS — E872 Acidosis: Secondary | ICD-10-CM | POA: Diagnosis present

## 2021-01-21 DIAGNOSIS — N400 Enlarged prostate without lower urinary tract symptoms: Secondary | ICD-10-CM | POA: Diagnosis present

## 2021-01-21 DIAGNOSIS — D630 Anemia in neoplastic disease: Secondary | ICD-10-CM | POA: Diagnosis present

## 2021-01-21 DIAGNOSIS — N170 Acute kidney failure with tubular necrosis: Secondary | ICD-10-CM | POA: Diagnosis present

## 2021-01-21 DIAGNOSIS — J189 Pneumonia, unspecified organism: Secondary | ICD-10-CM | POA: Diagnosis present

## 2021-01-21 DIAGNOSIS — Z7984 Long term (current) use of oral hypoglycemic drugs: Secondary | ICD-10-CM

## 2021-01-21 DIAGNOSIS — E1165 Type 2 diabetes mellitus with hyperglycemia: Secondary | ICD-10-CM | POA: Diagnosis present

## 2021-01-21 DIAGNOSIS — D6959 Other secondary thrombocytopenia: Secondary | ICD-10-CM | POA: Diagnosis present

## 2021-01-21 DIAGNOSIS — E1122 Type 2 diabetes mellitus with diabetic chronic kidney disease: Secondary | ICD-10-CM | POA: Diagnosis present

## 2021-01-21 DIAGNOSIS — N1831 Chronic kidney disease, stage 3a: Secondary | ICD-10-CM | POA: Diagnosis present

## 2021-01-21 DIAGNOSIS — E876 Hypokalemia: Secondary | ICD-10-CM | POA: Diagnosis present

## 2021-01-21 DIAGNOSIS — C9200 Acute myeloblastic leukemia, not having achieved remission: Secondary | ICD-10-CM | POA: Diagnosis present

## 2021-01-21 DIAGNOSIS — Z791 Long term (current) use of non-steroidal anti-inflammatories (NSAID): Secondary | ICD-10-CM

## 2021-01-21 DIAGNOSIS — J9601 Acute respiratory failure with hypoxia: Secondary | ICD-10-CM | POA: Diagnosis present

## 2021-01-21 DIAGNOSIS — H9190 Unspecified hearing loss, unspecified ear: Secondary | ICD-10-CM | POA: Diagnosis present

## 2021-01-21 DIAGNOSIS — A419 Sepsis, unspecified organism: Principal | ICD-10-CM | POA: Diagnosis present

## 2021-01-21 DIAGNOSIS — Z8249 Family history of ischemic heart disease and other diseases of the circulatory system: Secondary | ICD-10-CM

## 2021-01-21 DIAGNOSIS — Z66 Do not resuscitate: Secondary | ICD-10-CM | POA: Diagnosis present

## 2021-01-21 DIAGNOSIS — D469 Myelodysplastic syndrome, unspecified: Secondary | ICD-10-CM

## 2021-01-21 DIAGNOSIS — Z7982 Long term (current) use of aspirin: Secondary | ICD-10-CM

## 2021-01-21 DIAGNOSIS — Z20822 Contact with and (suspected) exposure to covid-19: Secondary | ICD-10-CM | POA: Diagnosis present

## 2021-01-21 DIAGNOSIS — Z79899 Other long term (current) drug therapy: Secondary | ICD-10-CM

## 2021-01-21 DIAGNOSIS — I493 Ventricular premature depolarization: Secondary | ICD-10-CM | POA: Diagnosis present

## 2021-01-21 DIAGNOSIS — Z7189 Other specified counseling: Secondary | ICD-10-CM

## 2021-01-21 DIAGNOSIS — G9341 Metabolic encephalopathy: Secondary | ICD-10-CM | POA: Diagnosis present

## 2021-01-21 DIAGNOSIS — E785 Hyperlipidemia, unspecified: Secondary | ICD-10-CM | POA: Diagnosis present

## 2021-01-21 DIAGNOSIS — I129 Hypertensive chronic kidney disease with stage 1 through stage 4 chronic kidney disease, or unspecified chronic kidney disease: Secondary | ICD-10-CM | POA: Diagnosis present

## 2021-01-21 DIAGNOSIS — Z833 Family history of diabetes mellitus: Secondary | ICD-10-CM

## 2021-01-21 LAB — TYPE AND SCREEN
ABO/RH(D): B POS
Antibody Screen: NEGATIVE
Unit division: 0

## 2021-01-21 LAB — BPAM RBC
Blood Product Expiration Date: 202206012359
ISSUE DATE / TIME: 202205231354
Unit Type and Rh: 7300

## 2021-01-21 MED ORDER — SODIUM CHLORIDE 0.9 % IV SOLN
Freq: Once | INTRAVENOUS | Status: AC
  Filled 2021-01-21: qty 250

## 2021-01-21 MED ORDER — SODIUM CHLORIDE 0.9 % IV SOLN
20.0000 mg/m2 | Freq: Once | INTRAVENOUS | Status: AC
  Administered 2021-01-21: 40 mg via INTRAVENOUS
  Filled 2021-01-21 (×2): qty 8

## 2021-01-21 MED ORDER — ACYCLOVIR 400 MG PO TABS: 400.0000 mg | ORAL_TABLET | Freq: Two times a day (BID) | ORAL | 3 refills | Status: AC

## 2021-01-21 MED ORDER — PROCHLORPERAZINE MALEATE 10 MG PO TABS
10.0000 mg | ORAL_TABLET | Freq: Once | ORAL | Status: AC
Start: 2021-01-21 — End: 2021-01-21
  Administered 2021-01-21: 10 mg via ORAL
  Filled 2021-01-21: qty 1

## 2021-01-21 NOTE — Patient Instructions (Signed)
C[facility] CENTER [place] [facility] [place]  Discharge Instructions: Thank you for choosing [facility] to provide your oncology and hematology care.  If you have a lab appointment with the Cancer Center, please go directly to the Cancer Center and check in at the registration area.  Wear comfortable clothing and clothing appropriate for easy access to any Portacath or PICC line.   We strive to give you quality time with your provider. You may need to reschedule your appointment if you arrive late (15 or more minutes).  Arriving late affects you and other patients whose appointments are after yours.  Also, if you miss three or more appointments without notifying the office, you may be dismissed from the clinic at the provider's discretion.      For prescription refill requests, have your pharmacy contact our office and allow 72 hours for refills to be completed.    Today you received the following chemotherapy and/or immunotherapy agents       To help prevent nausea and vomiting after your treatment, we encourage you to take your nausea medication as directed.  BELOW ARE SYMPTOMS THAT SHOULD BE REPORTED IMMEDIATELY: *FEVER GREATER THAN 100.4 F (38 C) OR HIGHER *CHILLS OR SWEATING *NAUSEA AND VOMITING THAT IS NOT CONTROLLED WITH YOUR NAUSEA MEDICATION *UNUSUAL SHORTNESS OF BREATH *UNUSUAL BRUISING OR BLEEDING *URINARY PROBLEMS (pain or burning when urinating, or frequent urination) *BOWEL PROBLEMS (unusual diarrhea, constipation, pain near the anus) TENDERNESS IN MOUTH AND THROAT WITH OR WITHOUT PRESENCE OF ULCERS (sore throat, sores in mouth, or a toothache) UNUSUAL RASH, SWELLING OR PAIN  UNUSUAL VAGINAL DISCHARGE OR ITCHING   Items with * indicate a potential emergency and should be followed up as soon as possible or go to the Emergency Department if any problems should occur.  Please show the CHEMOTHERAPY ALERT CARD or IMMUNOTHERAPY ALERT CARD at check-in to the Emergency  Department and triage nurse.  Should you have questions after your visit or need to cancel or reschedule your appointment, please contact [facility] [place] [facility] [place]  [phone] and follow the prompts.  Office hours are 8:00 a.m. to 4:30 p.m. Monday - Friday. Please note that voicemails left after 4:00 p.m. may not be returned until the following business day.  We are closed weekends and major holidays. You have access to a nurse at all times for urgent questions. Please call the main number to the clinic [phone] and follow the prompts.  For any non-urgent questions, you may also contact your provider using MyChart. We now offer e-Visits for anyone 18 and older to request care online for non-urgent symptoms. For details visit [email or web address].   Also download the MyChart app! Go to the app store, search "MyChart", open the app, select [place], and log in with your MyChart username and password.  Due to Covid, a mask is required upon entering the hospital/clinic. If you do not have a mask, one will be given to you upon arrival. For doctor visits, patients may have 1 support person aged 18 or older with them. For treatment visits, patients cannot have anyone with them due to current Covid guidelines and our immunocompromised population.  

## 2021-01-22 ENCOUNTER — Inpatient Hospital Stay: Payer: Medicare Other

## 2021-01-22 VITALS — BP 118/56 | HR 83 | Temp 97.3°F | Resp 18

## 2021-01-22 DIAGNOSIS — D649 Anemia, unspecified: Secondary | ICD-10-CM

## 2021-01-22 DIAGNOSIS — Z7189 Other specified counseling: Secondary | ICD-10-CM

## 2021-01-22 DIAGNOSIS — C9200 Acute myeloblastic leukemia, not having achieved remission: Secondary | ICD-10-CM

## 2021-01-22 DIAGNOSIS — D469 Myelodysplastic syndrome, unspecified: Secondary | ICD-10-CM

## 2021-01-22 LAB — CBC WITH DIFFERENTIAL/PLATELET
Abs Immature Granulocytes: 43.76 10*3/uL — ABNORMAL HIGH (ref 0.00–0.07)
Basophils Absolute: 0.5 10*3/uL — ABNORMAL HIGH (ref 0.0–0.1)
Basophils Relative: 0 %
Eosinophils Absolute: 1.1 10*3/uL — ABNORMAL HIGH (ref 0.0–0.5)
Eosinophils Relative: 1 %
HCT: 23.6 % — ABNORMAL LOW (ref 39.0–52.0)
Hemoglobin: 7.4 g/dL — ABNORMAL LOW (ref 13.0–17.0)
Immature Granulocytes: 27 %
Lymphocytes Relative: 10 %
Lymphs Abs: 16.2 10*3/uL — ABNORMAL HIGH (ref 0.7–4.0)
MCH: 27.5 pg (ref 26.0–34.0)
MCHC: 31.4 g/dL (ref 30.0–36.0)
MCV: 87.7 fL (ref 80.0–100.0)
Monocytes Absolute: 27.4 10*3/uL — ABNORMAL HIGH (ref 0.1–1.0)
Monocytes Relative: 17 %
Neutro Abs: 70.6 10*3/uL — ABNORMAL HIGH (ref 1.7–7.7)
Neutrophils Relative %: 45 %
Platelets: 81 10*3/uL — ABNORMAL LOW (ref 150–400)
RBC Morphology: NONE SEEN
RBC: 2.69 MIL/uL — ABNORMAL LOW (ref 4.22–5.81)
RDW: 18.1 % — ABNORMAL HIGH (ref 11.5–15.5)
Smear Review: DECREASED
WBC: 159.5 10*3/uL (ref 4.0–10.5)
nRBC: 0.3 % — ABNORMAL HIGH (ref 0.0–0.2)

## 2021-01-22 LAB — SAMPLE TO BLOOD BANK

## 2021-01-22 LAB — COMPREHENSIVE METABOLIC PANEL
ALT: 41 U/L (ref 0–44)
AST: 48 U/L — ABNORMAL HIGH (ref 15–41)
Albumin: 3.8 g/dL (ref 3.5–5.0)
Alkaline Phosphatase: 51 U/L (ref 38–126)
Anion gap: 7 (ref 5–15)
BUN: 19 mg/dL (ref 8–23)
CO2: 28 mmol/L (ref 22–32)
Calcium: 9 mg/dL (ref 8.9–10.3)
Chloride: 104 mmol/L (ref 98–111)
Creatinine, Ser: 1.31 mg/dL — ABNORMAL HIGH (ref 0.61–1.24)
GFR, Estimated: 51 mL/min — ABNORMAL LOW (ref >60)
Glucose, Bld: 220 mg/dL — ABNORMAL HIGH (ref 70–99)
Potassium: 3.1 mmol/L — ABNORMAL LOW (ref 3.5–5.1)
Sodium: 139 mmol/L (ref 135–145)
Total Bilirubin: 0.7 mg/dL (ref 0.3–1.2)
Total Protein: 7 g/dL (ref 6.5–8.1)

## 2021-01-22 MED ORDER — SODIUM CHLORIDE 0.9 % IV SOLN
20.0000 mg/m2 | Freq: Once | INTRAVENOUS | Status: AC
  Administered 2021-01-22: 40 mg via INTRAVENOUS
  Filled 2021-01-22: qty 8

## 2021-01-22 MED ORDER — PROCHLORPERAZINE MALEATE 10 MG PO TABS
10.0000 mg | ORAL_TABLET | Freq: Once | ORAL | Status: AC
  Administered 2021-01-22: 10 mg via ORAL
  Filled 2021-01-22: qty 1

## 2021-01-22 MED ORDER — SODIUM CHLORIDE 0.9 % IV SOLN
Freq: Once | INTRAVENOUS | Status: AC
  Filled 2021-01-22: qty 250

## 2021-01-22 NOTE — Patient Instructions (Signed)
[facility] [place] [facility] [place]  Discharge Instructions: Thank you for choosing [facility] to provide your oncology and hematology care.  If you have a lab appointment with the Cancer Center, please go directly to the Cancer Center and check in at the registration area.  Wear comfortable clothing and clothing appropriate for easy access to any Portacath or PICC line.   We strive to give you quality time with your provider. You may need to reschedule your appointment if you arrive late (15 or more minutes).  Arriving late affects you and other patients whose appointments are after yours.  Also, if you miss three or more appointments without notifying the office, you may be dismissed from the clinic at the provider's discretion.      For prescription refill requests, have your pharmacy contact our office and allow 72 hours for refills to be completed.    Today you received the following chemotherapy and/or immunotherapy agents       To help prevent nausea and vomiting after your treatment, we encourage you to take your nausea medication as directed.  BELOW ARE SYMPTOMS THAT SHOULD BE REPORTED IMMEDIATELY: *FEVER GREATER THAN 100.4 F (38 C) OR HIGHER *CHILLS OR SWEATING *NAUSEA AND VOMITING THAT IS NOT CONTROLLED WITH YOUR NAUSEA MEDICATION *UNUSUAL SHORTNESS OF BREATH *UNUSUAL BRUISING OR BLEEDING *URINARY PROBLEMS (pain or burning when urinating, or frequent urination) *BOWEL PROBLEMS (unusual diarrhea, constipation, pain near the anus) TENDERNESS IN MOUTH AND THROAT WITH OR WITHOUT PRESENCE OF ULCERS (sore throat, sores in mouth, or a toothache) UNUSUAL RASH, SWELLING OR PAIN  UNUSUAL VAGINAL DISCHARGE OR ITCHING   Items with * indicate a potential emergency and should be followed up as soon as possible or go to the Emergency Department if any problems should occur.  Please show the CHEMOTHERAPY ALERT CARD or IMMUNOTHERAPY ALERT CARD at check-in to the Emergency  Department and triage nurse.  Should you have questions after your visit or need to cancel or reschedule your appointment, please contact [facility]ENTER [place] [facility] [place]  [phone] and follow the prompts.  Office hours are 8:00 a.m. to 4:30 p.m. Monday - Friday. Please note that voicemails left after 4:00 p.m. may not be returned until the following business day.  We are closed weekends and major holidays. You have access to a nurse at all times for urgent questions. Please call the main number to the clinic [phone] and follow the prompts.  For any non-urgent questions, you may also contact your provider using MyChart. We now offer e-Visits for anyone 18 and older to request care online for non-urgent symptoms. For details visit [email or web address].   Also download the MyChart app! Go to the app store, search "MyChart", open the app, select [place], and log in with your MyChart username and password.  Due to Covid, a mask is required upon entering the hospital/clinic. If you do not have a mask, one will be given to you upon arrival. For doctor visits, patients may have 1 support person aged 18 or older with them. For treatment visits, patients cannot have anyone with them due to current Covid guidelines and our immunocompromised population.  

## 2021-01-23 ENCOUNTER — Inpatient Hospital Stay: Payer: Medicare Other

## 2021-01-23 ENCOUNTER — Other Ambulatory Visit: Payer: Self-pay

## 2021-01-23 VITALS — BP 118/52 | HR 89 | Temp 97.7°F | Resp 18

## 2021-01-23 DIAGNOSIS — Z7189 Other specified counseling: Secondary | ICD-10-CM

## 2021-01-23 DIAGNOSIS — C9200 Acute myeloblastic leukemia, not having achieved remission: Secondary | ICD-10-CM

## 2021-01-23 DIAGNOSIS — D469 Myelodysplastic syndrome, unspecified: Secondary | ICD-10-CM

## 2021-01-23 MED ORDER — SODIUM CHLORIDE 0.9 % IV SOLN
Freq: Once | INTRAVENOUS | Status: AC
  Filled 2021-01-23: qty 250

## 2021-01-23 MED ORDER — PROCHLORPERAZINE MALEATE 10 MG PO TABS
10.0000 mg | ORAL_TABLET | Freq: Once | ORAL | Status: AC
  Administered 2021-01-23: 10 mg via ORAL
  Filled 2021-01-23: qty 1

## 2021-01-23 MED ORDER — SODIUM CHLORIDE 0.9 % IV SOLN
20.0000 mg/m2 | Freq: Once | INTRAVENOUS | Status: AC
  Administered 2021-01-23: 40 mg via INTRAVENOUS
  Filled 2021-01-23: qty 8

## 2021-01-24 ENCOUNTER — Inpatient Hospital Stay: Payer: Medicare Other

## 2021-01-24 ENCOUNTER — Other Ambulatory Visit: Payer: Self-pay | Admitting: Oncology

## 2021-01-24 ENCOUNTER — Inpatient Hospital Stay
Admission: EM | Admit: 2021-01-24 | Discharge: 2021-01-28 | DRG: 871 | Disposition: A | Discharge status: Home Health | Payer: Medicare Other | Attending: Internal Medicine | Admitting: Internal Medicine

## 2021-01-24 ENCOUNTER — Encounter: Payer: Self-pay | Admitting: Emergency Medicine

## 2021-01-24 ENCOUNTER — Encounter: Payer: Self-pay | Admitting: Internal Medicine

## 2021-01-24 ENCOUNTER — Emergency Department: Payer: Medicare Other

## 2021-01-24 ENCOUNTER — Inpatient Hospital Stay (HOSPITAL_BASED_OUTPATIENT_CLINIC_OR_DEPARTMENT_OTHER): Payer: Medicare Other | Admitting: Internal Medicine

## 2021-01-24 ENCOUNTER — Other Ambulatory Visit: Payer: Self-pay

## 2021-01-24 VITALS — BP 157/75 | HR 120 | Temp 99.0°F | Resp 20

## 2021-01-24 DIAGNOSIS — Z7984 Long term (current) use of oral hypoglycemic drugs: Secondary | ICD-10-CM

## 2021-01-24 DIAGNOSIS — J9600 Acute respiratory failure, unspecified whether with hypoxia or hypercapnia: Secondary | ICD-10-CM | POA: Diagnosis present

## 2021-01-24 DIAGNOSIS — N170 Acute kidney failure with tubular necrosis: Secondary | ICD-10-CM | POA: Diagnosis present

## 2021-01-24 DIAGNOSIS — N400 Enlarged prostate without lower urinary tract symptoms: Secondary | ICD-10-CM | POA: Diagnosis present

## 2021-01-24 DIAGNOSIS — Z8249 Family history of ischemic heart disease and other diseases of the circulatory system: Secondary | ICD-10-CM | POA: Diagnosis not present

## 2021-01-24 DIAGNOSIS — N179 Acute kidney failure, unspecified: Secondary | ICD-10-CM | POA: Diagnosis present

## 2021-01-24 DIAGNOSIS — E785 Hyperlipidemia, unspecified: Secondary | ICD-10-CM | POA: Diagnosis present

## 2021-01-24 DIAGNOSIS — E876 Hypokalemia: Secondary | ICD-10-CM | POA: Diagnosis present

## 2021-01-24 DIAGNOSIS — H9190 Unspecified hearing loss, unspecified ear: Secondary | ICD-10-CM

## 2021-01-24 DIAGNOSIS — A419 Sepsis, unspecified organism: Secondary | ICD-10-CM | POA: Diagnosis present

## 2021-01-24 DIAGNOSIS — N1831 Chronic kidney disease, stage 3a: Secondary | ICD-10-CM | POA: Diagnosis present

## 2021-01-24 DIAGNOSIS — N183 Chronic kidney disease, stage 3 unspecified: Secondary | ICD-10-CM | POA: Diagnosis present

## 2021-01-24 DIAGNOSIS — G9341 Metabolic encephalopathy: Secondary | ICD-10-CM | POA: Diagnosis present

## 2021-01-24 DIAGNOSIS — C9200 Acute myeloblastic leukemia, not having achieved remission: Secondary | ICD-10-CM

## 2021-01-24 DIAGNOSIS — E872 Acidosis: Secondary | ICD-10-CM | POA: Diagnosis present

## 2021-01-24 DIAGNOSIS — J9601 Acute respiratory failure with hypoxia: Secondary | ICD-10-CM | POA: Diagnosis present

## 2021-01-24 DIAGNOSIS — Z7982 Long term (current) use of aspirin: Secondary | ICD-10-CM

## 2021-01-24 DIAGNOSIS — E1165 Type 2 diabetes mellitus with hyperglycemia: Secondary | ICD-10-CM | POA: Diagnosis present

## 2021-01-24 DIAGNOSIS — E1122 Type 2 diabetes mellitus with diabetic chronic kidney disease: Secondary | ICD-10-CM | POA: Diagnosis present

## 2021-01-24 DIAGNOSIS — Z79899 Other long term (current) drug therapy: Secondary | ICD-10-CM

## 2021-01-24 DIAGNOSIS — D696 Thrombocytopenia, unspecified: Secondary | ICD-10-CM | POA: Diagnosis present

## 2021-01-24 DIAGNOSIS — D6959 Other secondary thrombocytopenia: Secondary | ICD-10-CM | POA: Diagnosis present

## 2021-01-24 DIAGNOSIS — Z7189 Other specified counseling: Secondary | ICD-10-CM

## 2021-01-24 DIAGNOSIS — Z20822 Contact with and (suspected) exposure to covid-19: Secondary | ICD-10-CM | POA: Diagnosis present

## 2021-01-24 DIAGNOSIS — I10 Essential (primary) hypertension: Secondary | ICD-10-CM | POA: Diagnosis present

## 2021-01-24 DIAGNOSIS — J189 Pneumonia, unspecified organism: Secondary | ICD-10-CM | POA: Diagnosis present

## 2021-01-24 DIAGNOSIS — R6520 Severe sepsis without septic shock: Secondary | ICD-10-CM

## 2021-01-24 DIAGNOSIS — D469 Myelodysplastic syndrome, unspecified: Secondary | ICD-10-CM

## 2021-01-24 DIAGNOSIS — I129 Hypertensive chronic kidney disease with stage 1 through stage 4 chronic kidney disease, or unspecified chronic kidney disease: Secondary | ICD-10-CM | POA: Diagnosis present

## 2021-01-24 DIAGNOSIS — R509 Fever, unspecified: Secondary | ICD-10-CM | POA: Diagnosis present

## 2021-01-24 DIAGNOSIS — Z66 Do not resuscitate: Secondary | ICD-10-CM | POA: Diagnosis present

## 2021-01-24 DIAGNOSIS — I493 Ventricular premature depolarization: Secondary | ICD-10-CM | POA: Diagnosis present

## 2021-01-24 DIAGNOSIS — Z791 Long term (current) use of non-steroidal anti-inflammatories (NSAID): Secondary | ICD-10-CM

## 2021-01-24 DIAGNOSIS — Z833 Family history of diabetes mellitus: Secondary | ICD-10-CM | POA: Diagnosis not present

## 2021-01-24 DIAGNOSIS — D649 Anemia, unspecified: Secondary | ICD-10-CM

## 2021-01-24 DIAGNOSIS — D72829 Elevated white blood cell count, unspecified: Secondary | ICD-10-CM | POA: Diagnosis present

## 2021-01-24 DIAGNOSIS — D630 Anemia in neoplastic disease: Secondary | ICD-10-CM | POA: Diagnosis present

## 2021-01-24 DIAGNOSIS — I5021 Acute systolic (congestive) heart failure: Secondary | ICD-10-CM | POA: Diagnosis not present

## 2021-01-24 LAB — URINALYSIS, COMPLETE (UACMP) WITH MICROSCOPIC
Bilirubin Urine: NEGATIVE
Glucose, UA: >=500 mg/dL — AB
Ketones, ur: NEGATIVE mg/dL
Leukocytes,Ua: NEGATIVE
Nitrite: NEGATIVE
Protein, ur: 100 mg/dL — AB
Specific Gravity, Urine: 1.018 (ref 1.005–1.030)
pH: 5 (ref 5.0–8.0)

## 2021-01-24 LAB — CBC WITH DIFFERENTIAL/PLATELET
Abs Immature Granulocytes: 20.2 10*3/uL — ABNORMAL HIGH (ref 0.00–0.07)
Abs Immature Granulocytes: 37 10*3/uL — ABNORMAL HIGH (ref 0.00–0.07)
Band Neutrophils: 6 %
Basophils Absolute: 0 10*3/uL (ref 0.0–0.1)
Basophils Absolute: 0.5 10*3/uL — ABNORMAL HIGH (ref 0.0–0.1)
Basophils Relative: 0 %
Basophils Relative: 0 %
Eosinophils Absolute: 0 10*3/uL (ref 0.0–0.5)
Eosinophils Absolute: 0.4 10*3/uL (ref 0.0–0.5)
Eosinophils Relative: 0 %
Eosinophils Relative: 0 %
HCT: 21.3 % — ABNORMAL LOW (ref 39.0–52.0)
HCT: 19.7 % — ABNORMAL LOW (ref 39.0–52.0)
Hemoglobin: 6.8 g/dL — ABNORMAL LOW (ref 13.0–17.0)
Hemoglobin: 6.4 g/dL — ABNORMAL LOW (ref 13.0–17.0)
Immature Granulocytes: 20 %
Lymphocytes Relative: 6 %
Lymphocytes Relative: 2 %
Lymphs Abs: 9.3 10*3/uL — ABNORMAL HIGH (ref 0.7–4.0)
Lymphs Abs: 4.2 10*3/uL — ABNORMAL HIGH (ref 0.7–4.0)
MCH: 28.1 pg (ref 26.0–34.0)
MCH: 28.4 pg (ref 26.0–34.0)
MCHC: 31.9 g/dL (ref 30.0–36.0)
MCHC: 32.5 g/dL (ref 30.0–36.0)
MCV: 88 fL (ref 80.0–100.0)
MCV: 87.6 fL (ref 80.0–100.0)
Metamyelocytes Relative: 3 %
Monocytes Absolute: 27.9 10*3/uL — ABNORMAL HIGH (ref 0.1–1.0)
Monocytes Absolute: 52.7 10*3/uL — ABNORMAL HIGH (ref 0.1–1.0)
Monocytes Relative: 18 %
Monocytes Relative: 29 %
Myelocytes: 10 %
Neutro Abs: 97.7 10*3/uL — ABNORMAL HIGH (ref 1.7–7.7)
Neutro Abs: 86.8 10*3/uL — ABNORMAL HIGH (ref 1.7–7.7)
Neutrophils Relative %: 57 %
Neutrophils Relative %: 49 %
Platelets: 71 10*3/uL — ABNORMAL LOW (ref 150–400)
Platelets: 72 10*3/uL — ABNORMAL LOW (ref 150–400)
RBC Morphology: NONE SEEN
RBC: 2.42 MIL/uL — ABNORMAL LOW (ref 4.22–5.81)
RBC: 2.25 MIL/uL — ABNORMAL LOW (ref 4.22–5.81)
RDW: 18.3 % — ABNORMAL HIGH (ref 11.5–15.5)
RDW: 18.2 % — ABNORMAL HIGH (ref 11.5–15.5)
Smear Review: NORMAL
Smear Review: NORMAL
WBC: 155 10*3/uL (ref 4.0–10.5)
WBC: 181.6 10*3/uL (ref 4.0–10.5)
nRBC: 0.5 % — ABNORMAL HIGH (ref 0.0–0.2)
nRBC: 0.4 % — ABNORMAL HIGH (ref 0.0–0.2)

## 2021-01-24 LAB — BLOOD GAS, ARTERIAL
Acid-base deficit: 4 mmol/L — ABNORMAL HIGH (ref 0.0–2.0)
Bicarbonate: 20.4 mmol/L (ref 20.0–28.0)
Delivery systems: POSITIVE
Expiratory PAP: 6
FIO2: 0.4
Inspiratory PAP: 12
O2 Saturation: 83.4 %
Patient temperature: 37
pCO2 arterial: 33 mmHg (ref 32.0–48.0)
pH, Arterial: 7.4 (ref 7.350–7.450)
pO2, Arterial: 48 mmHg — ABNORMAL LOW (ref 83.0–108.0)

## 2021-01-24 LAB — URIC ACID: Uric Acid, Serum: 6.5 mg/dL (ref 3.7–8.6)

## 2021-01-24 LAB — RESP PANEL BY RT-PCR (FLU A&B, COVID) ARPGX2
Influenza A by PCR: NEGATIVE
Influenza B by PCR: NEGATIVE
SARS Coronavirus 2 by RT PCR: NEGATIVE

## 2021-01-24 LAB — PREPARE RBC (CROSSMATCH)

## 2021-01-24 LAB — SAMPLE TO BLOOD BANK

## 2021-01-24 LAB — LACTIC ACID, PLASMA
Lactic Acid, Venous: 3.2 mmol/L (ref 0.5–1.9)
Lactic Acid, Venous: 3 mmol/L (ref 0.5–1.9)
Lactic Acid, Venous: 5 mmol/L (ref 0.5–1.9)

## 2021-01-24 LAB — PROTIME-INR
INR: 1.4 — ABNORMAL HIGH (ref 0.8–1.2)
Prothrombin Time: 17.5 seconds — ABNORMAL HIGH (ref 11.4–15.2)

## 2021-01-24 LAB — COMPREHENSIVE METABOLIC PANEL
ALT: 40 U/L (ref 0–44)
AST: 50 U/L — ABNORMAL HIGH (ref 15–41)
Albumin: 3.6 g/dL (ref 3.5–5.0)
Alkaline Phosphatase: 53 U/L (ref 38–126)
Anion gap: 14 (ref 5–15)
BUN: 19 mg/dL (ref 8–23)
CO2: 22 mmol/L (ref 22–32)
Calcium: 8.8 mg/dL — ABNORMAL LOW (ref 8.9–10.3)
Chloride: 99 mmol/L (ref 98–111)
Creatinine, Ser: 1.59 mg/dL — ABNORMAL HIGH (ref 0.61–1.24)
GFR, Estimated: 40 mL/min — ABNORMAL LOW (ref >60)
Glucose, Bld: 248 mg/dL — ABNORMAL HIGH (ref 70–99)
Potassium: 2.2 mmol/L — CL (ref 3.5–5.1)
Sodium: 135 mmol/L (ref 135–145)
Total Bilirubin: 0.8 mg/dL (ref 0.3–1.2)
Total Protein: 6.9 g/dL (ref 6.5–8.1)

## 2021-01-24 LAB — MAGNESIUM: Magnesium: 1.6 mg/dL — ABNORMAL LOW (ref 1.7–2.4)

## 2021-01-24 LAB — CBG MONITORING, ED
Glucose-Capillary: 276 mg/dL — ABNORMAL HIGH (ref 70–99)
Glucose-Capillary: 341 mg/dL — ABNORMAL HIGH (ref 70–99)
Glucose-Capillary: 147 mg/dL — ABNORMAL HIGH (ref 70–99)

## 2021-01-24 LAB — HEMOGLOBIN A1C
Hgb A1c MFr Bld: 7.6 % — ABNORMAL HIGH (ref 4.8–5.6)
Mean Plasma Glucose: 171 mg/dL

## 2021-01-24 LAB — LACTATE DEHYDROGENASE: LDH: 672 U/L — ABNORMAL HIGH (ref 98–192)

## 2021-01-24 LAB — POTASSIUM: Potassium: 2.9 mmol/L — ABNORMAL LOW (ref 3.5–5.1)

## 2021-01-24 LAB — PHOSPHORUS: Phosphorus: 3.1 mg/dL (ref 2.5–4.6)

## 2021-01-24 LAB — APTT: aPTT: 53 seconds — ABNORMAL HIGH (ref 24–36)

## 2021-01-24 MED ORDER — VANCOMYCIN HCL IN DEXTROSE 1-5 GM/200ML-% IV SOLN
1000.0000 mg | Freq: Once | INTRAVENOUS | Status: AC
Start: 2021-01-24 — End: 2021-01-24
  Administered 2021-01-24: 1000 mg via INTRAVENOUS
  Filled 2021-01-24: qty 200

## 2021-01-24 MED ORDER — LACTATED RINGERS IV SOLN: INTRAVENOUS | Status: DC

## 2021-01-24 MED ORDER — ACYCLOVIR 200 MG PO CAPS
400.0000 mg | ORAL_CAPSULE | Freq: Two times a day (BID) | ORAL | Status: DC
  Administered 2021-01-25 – 2021-01-28 (×7): 400 mg via ORAL
  Filled 2021-01-24 (×11): qty 2

## 2021-01-24 MED ORDER — INSULIN ASPART 100 UNIT/ML IJ SOLN
0.0000 [IU] | INTRAMUSCULAR | Status: DC
  Administered 2021-01-24: 11 [IU] via SUBCUTANEOUS
  Administered 2021-01-24: 2 [IU] via SUBCUTANEOUS
  Administered 2021-01-25 (×2): 3 [IU] via SUBCUTANEOUS
  Administered 2021-01-25: 22:00:00 5 [IU] via SUBCUTANEOUS
  Administered 2021-01-25: 3 [IU] via SUBCUTANEOUS
  Administered 2021-01-25: 5 [IU] via SUBCUTANEOUS
  Administered 2021-01-26: 3 [IU] via SUBCUTANEOUS
  Administered 2021-01-26 (×2): 5 [IU] via SUBCUTANEOUS
  Administered 2021-01-26: 09:00:00 3 [IU] via SUBCUTANEOUS
  Administered 2021-01-26 (×2): 5 [IU] via SUBCUTANEOUS
  Administered 2021-01-27: 8 [IU] via SUBCUTANEOUS
  Administered 2021-01-27 (×2): 3 [IU] via SUBCUTANEOUS
  Administered 2021-01-27: 05:00:00 2 [IU] via SUBCUTANEOUS
  Administered 2021-01-27: 3 [IU] via SUBCUTANEOUS
  Administered 2021-01-27: 5 [IU] via SUBCUTANEOUS
  Administered 2021-01-28: 05:00:00 2 [IU] via SUBCUTANEOUS
  Administered 2021-01-28: 09:00:00 3 [IU] via SUBCUTANEOUS
  Administered 2021-01-28: 01:00:00 2 [IU] via SUBCUTANEOUS
  Filled 2021-01-24 (×21): qty 1

## 2021-01-24 MED ORDER — SODIUM CHLORIDE 0.9 % IV SOLN: 8.0000 mg | Freq: Once | INTRAVENOUS | Status: DC

## 2021-01-24 MED ORDER — SODIUM CHLORIDE 0.9 % IV SOLN
2.0000 g | INTRAVENOUS | Status: DC
  Administered 2021-01-25 – 2021-01-28 (×4): 2 g via INTRAVENOUS
  Filled 2021-01-24 (×3): qty 20
  Filled 2021-01-24: qty 2
  Filled 2021-01-24: qty 20

## 2021-01-24 MED ORDER — SODIUM CHLORIDE 0.9 % IV SOLN
10.0000 mL/h | Freq: Once | INTRAVENOUS | Status: AC
  Administered 2021-01-24: 10 mL/h via INTRAVENOUS

## 2021-01-24 MED ORDER — ALLOPURINOL 100 MG PO TABS
300.0000 mg | ORAL_TABLET | Freq: Every day | ORAL | Status: DC
  Administered 2021-01-25 – 2021-01-28 (×4): 300 mg via ORAL
  Filled 2021-01-24: qty 1
  Filled 2021-01-24 (×3): qty 3

## 2021-01-24 MED ORDER — POTASSIUM CHLORIDE 10 MEQ/100ML IV SOLN
10.0000 meq | Freq: Once | INTRAVENOUS | Status: AC
  Administered 2021-01-24: 10 meq via INTRAVENOUS
  Filled 2021-01-24: qty 100

## 2021-01-24 MED ORDER — ONDANSETRON HCL 4 MG/2ML IJ SOLN
4.0000 mg | Freq: Four times a day (QID) | INTRAMUSCULAR | Status: DC | PRN
  Administered 2021-01-24: 4 mg via INTRAVENOUS
  Filled 2021-01-24: qty 2

## 2021-01-24 MED ORDER — LIDOCAINE-PRILOCAINE 2.5-2.5 % EX CREA
TOPICAL_CREAM | Freq: Once | CUTANEOUS | Status: DC
  Filled 2021-01-24: qty 5

## 2021-01-24 MED ORDER — VITAMIN B-12 1000 MCG PO TABS: 1000.0000 ug | ORAL_TABLET | Freq: Every day | ORAL | Status: DC

## 2021-01-24 MED ORDER — OMEGA-3-ACID ETHYL ESTERS 1 G PO CAPS
1.0000 g | ORAL_CAPSULE | Freq: Every day | ORAL | Status: DC
  Administered 2021-01-25 – 2021-01-28 (×4): 1 g via ORAL
  Filled 2021-01-24 (×4): qty 1

## 2021-01-24 MED ORDER — CEFEPIME HCL 2 G IJ SOLR
2.0000 g | Freq: Once | INTRAMUSCULAR | Status: AC
Start: 2021-01-24 — End: 2021-01-24
  Administered 2021-01-24: 2 g via INTRAVENOUS
  Filled 2021-01-24: qty 2

## 2021-01-24 MED ORDER — ACYCLOVIR 400 MG PO TABS: 400.0000 mg | ORAL_TABLET | Freq: Two times a day (BID) | ORAL | Status: DC

## 2021-01-24 MED ORDER — INSULIN ASPART 100 UNIT/ML IJ SOLN
0.0000 [IU] | Freq: Three times a day (TID) | INTRAMUSCULAR | Status: DC
Start: 2021-01-24 — End: 2021-01-24

## 2021-01-24 MED ORDER — POLYETHYLENE GLYCOL 3350 17 G PO PACK: 17.0000 g | PACK | Freq: Every day | ORAL | Status: DC | PRN

## 2021-01-24 MED ORDER — SODIUM CHLORIDE 0.9 % IV SOLN
500.0000 mg | INTRAVENOUS | Status: DC
  Administered 2021-01-24 – 2021-01-27 (×4): 500 mg via INTRAVENOUS
  Filled 2021-01-24 (×5): qty 500

## 2021-01-24 MED ORDER — FINASTERIDE 5 MG PO TABS
5.0000 mg | ORAL_TABLET | Freq: Every day | ORAL | Status: DC
  Administered 2021-01-25 – 2021-01-28 (×4): 5 mg via ORAL
  Filled 2021-01-24 (×6): qty 1

## 2021-01-24 MED ORDER — VITAMIN B-12 1000 MCG PO TABS
1000.0000 ug | ORAL_TABLET | Freq: Every day | ORAL | Status: DC
  Administered 2021-01-25 – 2021-01-28 (×4): 1000 ug via ORAL
  Filled 2021-01-24 (×6): qty 1

## 2021-01-24 MED ORDER — SIMVASTATIN 20 MG PO TABS
40.0000 mg | ORAL_TABLET | Freq: Every day | ORAL | Status: DC
  Administered 2021-01-25 – 2021-01-28 (×4): 40 mg via ORAL
  Filled 2021-01-24 (×3): qty 2
  Filled 2021-01-24: qty 4

## 2021-01-24 MED ORDER — CALCIUM 1200 1200-1000 MG-UNIT PO CHEW: 1.0000 | CHEWABLE_TABLET | Freq: Two times a day (BID) | ORAL | Status: DC

## 2021-01-24 MED ORDER — METRONIDAZOLE 500 MG/100ML IV SOLN
500.0000 mg | Freq: Once | INTRAVENOUS | Status: AC
Start: 2021-01-24 — End: 2021-01-24
  Administered 2021-01-24: 500 mg via INTRAVENOUS
  Filled 2021-01-24: qty 100

## 2021-01-24 MED ORDER — ACETAMINOPHEN 325 MG PO TABS: 650.0000 mg | ORAL_TABLET | Freq: Four times a day (QID) | ORAL | Status: DC | PRN

## 2021-01-24 MED ORDER — POTASSIUM CHLORIDE CRYS ER 20 MEQ PO TBCR
40.0000 meq | EXTENDED_RELEASE_TABLET | Freq: Once | ORAL | Status: AC
  Administered 2021-01-24: 40 meq via ORAL
  Filled 2021-01-24: qty 2

## 2021-01-24 MED ORDER — CALCIUM CARBONATE 1250 (500 CA) MG PO TABS
1.0000 | ORAL_TABLET | Freq: Two times a day (BID) | ORAL | Status: DC
  Administered 2021-01-25 – 2021-01-28 (×6): 500 mg via ORAL
  Filled 2021-01-24 (×8): qty 1

## 2021-01-24 MED ORDER — ONDANSETRON HCL 4 MG/2ML IJ SOLN
INTRAMUSCULAR | Status: AC
  Filled 2021-01-24: qty 4

## 2021-01-24 MED ORDER — POTASSIUM CHLORIDE CRYS ER 20 MEQ PO TBCR: 40.0000 meq | EXTENDED_RELEASE_TABLET | Freq: Two times a day (BID) | ORAL | Status: DC

## 2021-01-24 MED ORDER — SODIUM CHLORIDE 0.9% IV SOLUTION
Freq: Once | INTRAVENOUS | Status: DC
  Filled 2021-01-24: qty 250

## 2021-01-24 MED ORDER — SODIUM CHLORIDE 0.9 % IV SOLN
20.0000 mg/m2 | Freq: Once | INTRAVENOUS | Status: DC
  Filled 2021-01-24: qty 8

## 2021-01-24 MED ORDER — MECLIZINE HCL 12.5 MG PO TABS
12.5000 mg | ORAL_TABLET | Freq: Two times a day (BID) | ORAL | Status: DC | PRN
  Filled 2021-01-24: qty 1

## 2021-01-24 MED ORDER — FINASTERIDE 5 MG PO TABS: 5.0000 mg | ORAL_TABLET | Freq: Every day | ORAL | Status: DC

## 2021-01-24 MED ORDER — ALLOPURINOL 300 MG PO TABS: 150.0000 mg | ORAL_TABLET | Freq: Every day | ORAL | Status: DC

## 2021-01-24 MED ORDER — ONDANSETRON HCL 4 MG/2ML IJ SOLN
8.0000 mg | Freq: Once | INTRAMUSCULAR | Status: AC
  Administered 2021-01-24: 8 mg via INTRAVENOUS

## 2021-01-24 MED ORDER — MAGNESIUM SULFATE 2 GM/50ML IV SOLN
2.0000 g | Freq: Once | INTRAVENOUS | Status: AC
  Administered 2021-01-24: 2 g via INTRAVENOUS
  Filled 2021-01-24: qty 50

## 2021-01-24 MED ORDER — LACTATED RINGERS IV BOLUS (SEPSIS)
1000.0000 mL | Freq: Once | INTRAVENOUS | Status: AC
Start: 2021-01-24 — End: 2021-01-24
  Administered 2021-01-24: 1000 mL via INTRAVENOUS

## 2021-01-24 MED ORDER — ACETAMINOPHEN 650 MG RE SUPP: 650.0000 mg | Freq: Four times a day (QID) | RECTAL | Status: DC | PRN

## 2021-01-24 MED ORDER — ONDANSETRON HCL 4 MG PO TABS: 4.0000 mg | ORAL_TABLET | Freq: Four times a day (QID) | ORAL | Status: DC | PRN

## 2021-01-24 NOTE — ED Notes (Signed)
ABG collected by RRT at this time.

## 2021-01-24 NOTE — ED Triage Notes (Signed)
Presents from [place] [facility]  The staff states he was in Afib  Denies any pain pt states he is weak  Febrile on arrival cough

## 2021-01-24 NOTE — ED Notes (Signed)
First CBG collected and recollected for suspected contamination of sample. Second CBG reading obtained from a different finger, and was noted to read 341, see chart. SSI to be given per [facility] for CBG of 341.

## 2021-01-24 NOTE — Progress Notes (Addendum)
Pharmacy Electrolyte Monitoring Consult:  Pharmacy consulted to assist in monitoring and replacing electrolytes in this 87 y.o. male admitted on 01/24/2021 with Fever   Labs:  Sodium (mmol/L)  Date Value  01/24/2021 135   Potassium (mmol/L)  Date Value  01/24/2021 2.2 (LL)   Magnesium (mg/dL)  Date Value  01/24/2021 1.6 (L)   Phosphorus (mg/dL)  Date Value  12/27/2020 4.0   Calcium (mg/dL)  Date Value  01/24/2021 8.8 (L)   Albumin (g/dL)  Date Value  01/24/2021 3.6    Assessment/Plan: 05/27  K 2.2  Mag 1.6  Phos 3.1  Scr 1.59 - MD ordered KCL 10 meq IV x 1 dose and KCL 40 meq PO x 1 dose and KCL 40 meq PO BID - will order  Magnesium sulfate 2 gm IV x 1  - will f/u K at 2000 -f/u electrolytes with am labs    Paul Pham A 01/24/2021 5:57 PM

## 2021-01-24 NOTE — H&P (Signed)
History and Physical    Paul Pham. DXI:[id number] DOB: 1925-11-06 DOA: 01/24/2021  PCP: Lequita Asal, MD   Patient coming from: Home  I have personally briefly reviewed patient's old medical records in [place]  Chief Complaint: Fever  HPI: Paul Pham. is a 87 y.o. 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 Patient has a history of AML and is currently receiving chemotherapy.  He was scheduled to get his treatment today at the cancer center (Day 5/10) but was sent to the ER for evaluation because he was very weak, febrile and had a wet sounding cough. Son states that he has had a cough for the last couple of days but sounds worse today and is also very weak.  His wife states that he has complained of a headache for which she gave him Tylenol.  Even though the cough sounds very wet it is nonproductive according to patient's family.  He also appears to have increased work of breathing and nausea without any vomiting. He denies having any chest pain, no abdominal pain, no changes in his bowel habits, no dizziness, no lightheadedness, no urinary frequency, no nocturia, no dysuria, no blurred vision, no focal deficits, no diaphoresis, no palpitations. Labs show sodium 135, potassium 2.2, chloride 99, bicarb 22, glucose 248, BUN 19, creatinine 1.58, calcium 8.8, alkaline phosphatase 53, albumin 3.6, AST 15, ALT 40, total protein 6.9, lactic acid 3.2, white count 181, hemoglobin 6.4, hematocrit 19.7, MCV 87.6, RDW 18.2, platelet count 72,000, PT 17.5, INR 1.4 Respiratory viral panel is negative Twelve-lead EKG reviewed by me shows sinus tachycardia with PVCs, ST and T wave changes in the inferior lateral leads.    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 Labs show hypokalemia with a potassium of 2.3 and worsening anemia with hemoglobin of 6.4g/dl compared to 7.4g/dl 2 days prior to his admission.  He also has lactic acidosis and will be admitted to the hospital for further evaluation.  Review of Systems: As per HPI otherwise all other systems reviewed and negative.    Past Medical History:  Diagnosis Date  . Anemia   . Arthritis   . BPH (benign prostatic hypertrophy)   . Cancer ([place])   . Complication of anesthesia    nausea  . Diabetes type 2, controlled ([place])   . HOH (hard of hearing)    r and L ears-70% loss per pt  . Hyperlipidemia   . Hypertension   . Myelodysplasia (myelodysplastic syndrome) ([place]) 2019    Past Surgical History:  Procedure Laterality Date  . APPENDECTOMY    . COLONOSCOPY  09/21/08   1 polyp found, tubular adenoma  . HAND SURGERY    . KNEE SURGERY Left      reports that he has never smoked. He has never used smokeless tobacco. He reports that he does not drink alcohol and does not use drugs.  No Known Allergies  Family History  Problem Relation Age of Onset  . Aneurysm Mother   . Heart disease Father   . Cancer Brother        skin  . Heart disease Brother   . Heart  disease Brother   . Cancer Sister        skin  . Aneurysm Brother   . Heart disease Brother   . Heart disease Sister   . Heart disease Sister   . COPD Sister   . Diabetes Sister       Prior to Admission medications   Medication Sig Start Date End Date Taking? Authorizing Provider  acyclovir (ZOVIRAX) 400 MG tablet Take 1 tablet (400 mg total) by mouth 2 (two) times daily. 01/21/21   Sindy Guadeloupe, MD  allopurinol (ZYLOPRIM) 300 MG tablet Take 0.5 tablets (150 mg total) by mouth daily. Patient taking differently: Take 150 mg by mouth daily. Advised by [facility] to take 1 tablet per day 12/11/20   Lequita Asal, MD  allopurinol  (ZYLOPRIM) 300 MG tablet Take 1 tablet (300 mg total) by mouth daily. 01/15/21   Sindy Guadeloupe, MD  aspirin 81 MG chewable tablet Chew 81 mg by mouth daily.    [provider]  Calcium Carbonate-Vit D-Min (CALCIUM 1200 PO) Take 1,200 mg by mouth in the morning and at bedtime.    [provider]  Cranberry (THERACRAN PO) Take 1 tablet by mouth daily.     [provider]  cyanocobalamin 1000 MCG tablet Take 1,000 mcg by mouth daily.    [provider]  finasteride (PROSCAR) 5 MG tablet Take 5 mg by mouth daily.    [provider]  glimepiride (AMARYL) 1 MG tablet Take 1 tablet by mouth daily after breakfast. 01/24/19 01/07/21  [provider]  glucose blood test strip FreeStyle Lite Strips    [provider]  LANCETS ULTRA FINE MISC Lancets,Ultra Thin 26 gauge    [provider]  lidocaine-prilocaine (EMLA) cream Apply to affected area once 01/15/21   Sindy Guadeloupe, MD  lisinopril (PRINIVIL,ZESTRIL) 10 MG tablet Take 10 mg by mouth daily.    [provider]  loperamide (IMODIUM) 2 MG capsule Take 1 capsule (2 mg total) by mouth See admin instructions. With onset of loose stool, take 4mg  followed by 2mg  every 2 hours until loose bowel movement stopped. Maximum: 16 mg/day 01/27/18   Earlie Server, MD  meclizine (ANTIVERT) 25 MG tablet Take 25 mg by mouth 2 (two) times daily as needed.    [provider]  meloxicam (MOBIC) 7.5 MG tablet Take 7.5 mg by mouth daily. 11/26/20   [provider]  metformin (FORTAMET) 1000 MG (OSM) 24 hr tablet Take 1,000 mg by mouth 2 (two) times daily with a meal.    [provider]  Multiple Vitamins-Minerals (CENTRUM SILVER PO) Take by mouth.    [provider]  mupirocin ointment (BACTROBAN) 2 % Apply 1 application topically daily. With dressing changes 01/09/20   Ralene Bathe, MD  Omega-3 Fatty Acids (FISH OIL PO) Take 1 tablet by mouth daily.     [provider]  ondansetron (ZOFRAN) 4 MG tablet Take 1 tablet (4 mg total) by mouth every 6 (six) hours as needed for nausea or vomiting. 07/18/19   Lequita Asal, MD  ondansetron (ZOFRAN) 8 MG tablet Take 1 tablet (8 mg total) by mouth 2 (two) times daily as needed (Nausea or vomiting). 01/15/21   Sindy Guadeloupe, MD  Polyethylene Glycol 3350 (MIRALAX PO) Take by mouth as needed.     [provider]  prochlorperazine (COMPAZINE) 10 MG tablet Take 1 tablet (10 mg total) by mouth every 6 (six) hours as  needed (Nausea or vomiting). 01/15/21   Sindy Guadeloupe, MD  Saw Palmetto-Phytosterols (PROSTATE SR PO) Take 1 tablet by mouth daily.     [provider]  simvastatin (ZOCOR) 40 MG tablet Take 40 mg by mouth daily.    [provider]    Physical Exam: Vitals:   01/24/21 1230 01/24/21 1300 01/24/21 1330 01/24/21 1400  BP: (!) 120/40 (!) 135/55 (!) 126/48 (!) 115/44  Pulse: (!) 119 (!) 123 (!) 122 (!) 125  Resp: (!) 23 (!) 29 (!) 27 (!) 27  Temp:    99.1 F (37.3 C)  TempSrc:      SpO2: 94% 93% 91% 93%  Weight:      Height:         Vitals:   01/24/21 1230 01/24/21 1300 01/24/21 1330 01/24/21 1400  BP: (!) 120/40 (!) 135/55 (!) 126/48 (!) 115/44  Pulse: (!) 119 (!) 123 (!) 122 (!) 125  Resp: (!) 23 (!) 29 (!) 27 (!) 27  Temp:    99.1 F (37.3 C)  TempSrc:      SpO2: 94% 93% 91% 93%  Weight:      Height:          Constitutional: Alert and oriented x 3 .  Acutely ill-appearing.  Increased work of breathing with audible rhonchi HEENT:      Head: Normocephalic and atraumatic.         Eyes: PERLA, EOMI, Conjunctivae pallor. Sclera is non-icteric.       Mouth/Throat: Mucous membranes are moist.       Neck: Supple with no signs of meningismus. Cardiovascular:  Tachycardia. No murmurs, gallops, or rubs. 2+ symmetrical distal pulses are present . No JVD. 1+ LE edema Respiratory:  Tachypnea.coarse breath sounds bilaterally. No wheezes or rhonchi.   Gastrointestinal: Soft, non tender, and non distended with positive bowel sounds.  Genitourinary: No CVA tenderness. Musculoskeletal: Nontender with normal range of motion in all extremities. No cyanosis, or erythema of extremities. Neurologic:  Face is symmetric. Moving all extremities. No gross focal neurologic deficits . Skin: Skin is warm, dry.  No rash or ulcers Psychiatric: Mood and affect are normal   Labs on Admission: I have personally reviewed following labs and imaging studies  CBC: Recent Labs  Lab 01/20/21 0844 01/22/21 0906 01/24/21 0849 01/24/21 1157  WBC 166.5* 159.5* 155.0* 181.6*  NEUTROABS 69.1* 70.6* 97.7* 86.8*  HGB 6.7* 7.4* 6.8* 6.4*  HCT 20.8* 23.6* 21.3* 19.7*  MCV 86.7 87.7 88.0 87.6  PLT 98* 81* 71* 72*   Basic Metabolic Panel: Recent Labs  Lab 01/20/21 0844 01/22/21 0906 01/24/21 1157  NA 136 139 135  K 3.3* 3.1* 2.2*  CL 99 104 99  CO2 24 28 22   GLUCOSE 228* 220* 248*  BUN 16 19 19   CREATININE 1.29* 1.31* 1.59*  CALCIUM 9.0 9.0 8.8*   GFR: Estimated Creatinine Clearance: 31.7 mL/min (A) (by C-G formula based on SCr of 1.59 mg/dL (H)). Liver Function Tests: Recent Labs  Lab 01/20/21 0844 01/22/21 0906 01/24/21 1157  AST 47* 48* 50*  ALT 40 41 40  ALKPHOS 57 51 53  BILITOT 0.9 0.7 0.8  PROT 7.1 7.0 6.9  ALBUMIN 3.9 3.8 3.6   No results for input(s): LIPASE, AMYLASE in the last 168 hours. No results for input(s): AMMONIA in the last 168 hours. Coagulation Profile: Recent Labs  Lab 01/24/21 1157  INR 1.4*   Cardiac Enzymes: No results for input(s): CKTOTAL, CKMB, CKMBINDEX, TROPONINI in  the last 168 hours. BNP (last 3 results) No results for input(s): PROBNP in the last 8760 hours. HbA1C: No results for input(s): HGBA1C in the last 72 hours. CBG: No results for input(s): GLUCAP in the last 168 hours. Lipid Profile: No results for input(s): CHOL, HDL, LDLCALC, TRIG, CHOLHDL, LDLDIRECT in the last 72 hours. Thyroid  Function Tests: No results for input(s): TSH, T4TOTAL, FREET4, T3FREE, THYROIDAB in the last 72 hours. Anemia Panel: No results for input(s): VITAMINB12, FOLATE, FERRITIN, TIBC, IRON, RETICCTPCT in the last 72 hours. Urine analysis:    Component Value Date/Time   COLORURINE YELLOW (A) 09/21/2017 1250   APPEARANCEUR HAZY (A) 09/21/2017 1250   LABSPEC 1.015 09/21/2017 1250   PHURINE 5.0 09/21/2017 1250   GLUCOSEU NEGATIVE 09/21/2017 1250   HGBUR NEGATIVE 09/21/2017 1250   BILIRUBINUR NEGATIVE 09/21/2017 1250   KETONESUR NEGATIVE 09/21/2017 1250   PROTEINUR NEGATIVE 09/21/2017 1250   NITRITE NEGATIVE 09/21/2017 1250   LEUKOCYTESUR NEGATIVE 09/21/2017 1250    Radiological Exams on Admission: DG Chest Port 1 View  Result Date: 01/24/2021 CLINICAL DATA:  Sepsis. EXAM: PORTABLE CHEST 1 VIEW COMPARISON:  November 08, 2006. FINDINGS: The heart size and mediastinal contours are within normal limits. Mild bibasilar subsegmental atelectasis is noted. The visualized skeletal structures are unremarkable. IMPRESSION: Mild bibasilar subsegmental atelectasis. Aortic Atherosclerosis (ICD10-I70.0). Electronically Signed   By: Marijo Conception M.D.   On: 01/24/2021 12:24     Assessment/Plan Principal Problem:   Sepsis ([place]) Active Problems:   Anemia in neoplastic disease   Hard of hearing   HTN (hypertension)   Type 2 diabetes mellitus with hyperglycemia, without long-term current use of insulin (H[facility])   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[place])     Sepsis As evidenced by tachycardia, tachypnea, possible pneumonia and lactic acidosis. Patient has a wet sounding cough as well as diffuse rhonchi He received 1 L IV fluid bolus in the ER We will hold off on further IV fluid administration due to worsening respiratory distress Obtain stat CT scan of the chest without contrast We will place patient empirically on IV Rocephin and Zithromax We will trend lactic acid levels    Acute  respiratory failure Most likely secondary to pneumonia Patient with increased work of breathing requiring BiPAP to improve oxygenation and reduce work of breathing. 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 Hemoglobin is 6.4g/dl We will transfuse 1 unit of packed RBC     Diabetes mellitus with hyperglycemia Hold oral hypoglycemic agents Glycemic control with sliding scale insulin Accu-Cheks every 4 hours    Hypertension Hold lisinopril for now    Hypokalemia Supplement potassium Check magnesium levels   DVT prophylaxis: SCD Code Status: DO NOT RESUSCITATE Family Communication: Greater than 50% of time was spent discussing patient's condition and plan of care with his wife and son at the bedside.  CODE STATUS was discussed and patient is a DNR. Disposition Plan: Back to previous home environment Consults called: Oncology/critical care Status: At the time of admission, it appears that the appropriate admission status for this patient is inpatient. This is judged to be reasonable and necessary in order to provide the required intensity of service to ensure the patient's safety given the presenting symptoms, school exam findings, and initial radiographic and laboratory  data in the context of their comorbid conditions. Patient requires inpatient status due to high intensity of service, high risk for further deterioration and high frequency of surveillance required.    Collier Bullock MD [facility]     01/24/2021, 2:49 PM

## 2021-01-24 NOTE — ED Notes (Signed)
Patient reports improvement to nausea, anxiety, and shortness of breath at this time. Patient remains on BIPAP.

## 2021-01-24 NOTE — ED Notes (Signed)
Called to room by family.  Pt is reporting feeling "sick".  Pt is restless and has become more anxious.  Pt appears pale and struggling to breath.  Dr. Kerman Passey to room, new orders received.

## 2021-01-24 NOTE — Progress Notes (Signed)
CODE SEPSIS - PHARMACY COMMUNICATION  **Broad Spectrum Antibiotics should be administered within 1 hour of Sepsis diagnosis**  Time Code Sepsis Called/Page Received: 1135  Antibiotics Ordered: cefepime, vanc  Time of 1st antibiotic administration: 1310  Additional action taken by pharmacy:    If necessary, Name of Provider/Nurse Contacted:      Noralee Space ,PharmD Clinical Pharmacist  01/24/2021  12:34 PM

## 2021-01-24 NOTE — ED Notes (Signed)
This RN to blood bank for blood products.

## 2021-01-24 NOTE — Assessment & Plan Note (Addendum)
#   Acute Myeloid leukemia- elevated WBC [59k-WBC; hb-6.8; platelets-70]-currently on Dacogen cycle number 1-day #4.  Hold Dacogen today-see acute issues below  # Worsening fatigue/ feeling weak/ Tachycardia-clinically suspicious for symptomatic A. fib with RVR.  Given life-threatening situation recommend emergent evaluation in the emergency room.   #Severe anemia hemoglobin 6.8-chronic 7-8; from underlying acute leukemia.  Patient will need blood transfusion in the hospital.  #Worsening cough/O2 saturations 94 at rest-patient would need infectious work-up/including chest x-ray-to rule out any acute respiratory process.   #I had a long discussion the patient/wife and 2 sons regarding the seriousness of the illness; and the urgency to address his heart rate.  EMS was called/transported uneventfully.  Informed Dr. Rao-patient's primary oncologist  # DISPOSITION: # EMS to ER # follow up TBD-Dr.B

## 2021-01-24 NOTE — Progress Notes (Signed)
[name] NOTE  Patient Care Team: Lequita Asal, MD as PCP - General (Hematology and Oncology) Lequita Asal, MD as PCP - Hematology/Oncology (Oncology)  CHIEF COMPLAINTS/PURPOSE OF CONSULTATION: AML  #  Oncology History Overview Note  Patient was seen during his most recent admission on 08/18/2017. Patient is hard of hearing. He has been feeling increased fatigue, dyspnea, and hemoglobin has dropped from baseline 10 to 7 within the past couple of months. Last colonoscopy was done 10 years ago. Outpatient stool occult test is negative. Patient also takes iron pills and stool is always black.  # He has had extensive workup including urinalysis negative for hematuria, normal B93 and folic level. Normal TSH, negative hemolysis workup. Her stool occult  is being negative as well. 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 Patient and his family members understand that this condition is not curable.   Treatment:  Azacitidine 75 mg/m2  Day 1-5 of every 28 days cycle.  Cycle 1 3/7-3/8, 3/11-13 cycle 2  12/06/17- 12/10/17, day 1-5 Cycle 3 plan 5/6 -5/10. Cycle 4 6/3-6/7 Cycle 5. Plan for 7/8-7/12 d/t holiday   Aranesp 196mcg 3/14 and 3/28 333mcg 5/10, 5/24, 5/31, 6/14 and 6/21     MDS (myelodysplastic syndrome) ([place])  10/05/2017 Initial Diagnosis   MDS (myelodysplastic syndrome) (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 87 y.o.  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  The clinic patient complains of worsening nausea and vomiting-since yesterday.  Feels weak.  Has had cough for the last several days.  No fevers.  Complains of chills.  No abdominal pain.  Mild diarrhea.  Intermittent headaches.  Patient denies any chest pain.  Overall he feels weak.   Review of Systems  Constitutional: Positive for weight loss. Negative for chills, diaphoresis and malaise/fatigue.  HENT: Negative for nosebleeds and sore throat.   Eyes: Negative for double vision.  Respiratory: Positive for cough. Negative for hemoptysis, sputum production and wheezing.   Cardiovascular: Negative for chest pain, palpitations, orthopnea and leg swelling.  Gastrointestinal: Positive for diarrhea, nausea and vomiting. Negative for abdominal pain, blood in stool, constipation, heartburn and melena.  Genitourinary: Negative for dysuria, frequency and urgency.  Musculoskeletal: Negative for back pain and joint pain.  Skin: Negative.  Negative for itching and rash.  Neurological: Positive for headaches. Negative for dizziness, tingling, focal weakness and weakness.  Endo/Heme/Allergies: Does not bruise/bleed easily.  Psychiatric/Behavioral: Negative for depression. The patient is not nervous/anxious and does not have insomnia.      MEDICAL HISTORY:  Past Medical History:  Diagnosis Date  . Anemia   . Arthritis   . BPH (benign prostatic hypertrophy)   . Cancer ([place])   . Complication of anesthesia    nausea  . Diabetes type 2, controlled ([place])   .  HOH (hard of hearing)    r and L ears-70% loss per pt  . Hyperlipidemia   . Hypertension   . Myelodysplasia (myelodysplastic syndrome) ([place]) 2019    SURGICAL HISTORY: Past Surgical History:  Procedure Laterality Date  . APPENDECTOMY    . COLONOSCOPY  09/21/08   1 polyp found, tubular  adenoma  . HAND SURGERY    . KNEE SURGERY Left     SOCIAL HISTORY: Social History   Socioeconomic History  . Marital status: Married    Spouse name: Not on file  . Number of children: Not on file  . Years of education: Not on file  . Highest education level: Not on file  Occupational History  . Not on file  Tobacco Use  . Smoking status: Never Smoker  . Smokeless tobacco: Never Used  Vaping Use  . Vaping Use: Never used  Substance and Sexual Activity  . Alcohol use: No    Alcohol/week: 0.0 standard drinks  . Drug use: No  . Sexual activity: Not on file  Other Topics Concern  . Not on file  Social History Narrative   Lives in private residence with wife   Social Determinants of Health   Financial Resource Strain: Not on file  Food Insecurity: Not on file  Transportation Needs: Not on file  Physical Activity: Not on file  Stress: Not on file  Social Connections: Not on file  Intimate Partner Violence: Not on file    FAMILY HISTORY: Family History  Problem Relation Age of Onset  . Aneurysm Mother   . Heart disease Father   . Cancer Brother        skin  . Heart disease Brother   . Heart disease Brother   . Cancer Sister        skin  . Aneurysm Brother   . Heart disease Brother   . Heart disease Sister   . Heart disease Sister   . COPD Sister   . Diabetes Sister     ALLERGIES:  has No Known Allergies.  MEDICATIONS:  No current facility-administered medications for this visit.   Current Outpatient Medications  Medication Sig Dispense Refill  . acyclovir (ZOVIRAX) 400 MG tablet Take 1 tablet (400 mg total) by mouth 2 (two) times daily. 60 tablet 3  . allopurinol (ZYLOPRIM) 300 MG tablet Take 0.5 tablets (150 mg total) by mouth daily. (Patient taking differently: Take 150 mg by mouth daily. Advised by [facility] to take 1 tablet per day) 30 tablet 0  . allopurinol (ZYLOPRIM) 300 MG tablet Take 1 tablet (300 mg total) by mouth daily. 30 tablet 2  . aspirin 81 MG  chewable tablet Chew 81 mg by mouth daily.    . Calcium Carbonate-Vit D-Min (CALCIUM 1200 PO) Take 1,200 mg by mouth in the morning and at bedtime.    . Cranberry (THERACRAN PO) Take 1 tablet by mouth daily.     . cyanocobalamin 1000 MCG tablet Take 1,000 mcg by mouth daily.    . finasteride (PROSCAR) 5 MG tablet Take 5 mg by mouth daily.    Marland Kitchen glucose blood test strip FreeStyle Lite Strips    . LANCETS ULTRA FINE MISC Lancets,Ultra Thin 26 gauge    . lidocaine-prilocaine (EMLA) cream Apply to affected area once 30 g 3  . lisinopril (PRINIVIL,ZESTRIL) 10 MG tablet Take 10 mg by mouth daily.    Marland Kitchen loperamide (IMODIUM) 2 MG capsule Take 1 capsule (2 mg total) by mouth See admin instructions.  With onset of loose stool, take 4mg  followed by 2mg  every 2 hours until loose bowel movement stopped. Maximum: 16 mg/day 30 capsule 0  . meclizine (ANTIVERT) 25 MG tablet Take 25 mg by mouth 2 (two) times daily as needed.    . meloxicam (MOBIC) 7.5 MG tablet Take 7.5 mg by mouth daily.    . metformin (FORTAMET) 1000 MG (OSM) 24 hr tablet Take 1,000 mg by mouth 2 (two) times daily with a meal.    . Multiple Vitamins-Minerals (CENTRUM SILVER PO) Take by mouth.    . mupirocin ointment (BACTROBAN) 2 % Apply 1 application topically daily. With dressing changes 22 g 0  . Omega-3 Fatty Acids (FISH OIL PO) Take 1 tablet by mouth daily.     . ondansetron (ZOFRAN) 4 MG tablet Take 1 tablet (4 mg total) by mouth every 6 (six) hours as needed for nausea or vomiting. 30 tablet 2  . ondansetron (ZOFRAN) 8 MG tablet Take 1 tablet (8 mg total) by mouth 2 (two) times daily as needed (Nausea or vomiting). 30 tablet 1  . Polyethylene Glycol 3350 (MIRALAX PO) Take by mouth as needed.     . prochlorperazine (COMPAZINE) 10 MG tablet Take 1 tablet (10 mg total) by mouth every 6 (six) hours as needed (Nausea or vomiting). 30 tablet 1  . Saw [place]metto-Phytosterols (PROSTATE SR PO) Take 1 tablet by mouth daily.     . simvastatin (ZOCOR)  40 MG tablet Take 40 mg by mouth daily.    Marland Kitchen glimepiride (AMARYL) 1 MG tablet Take 1 tablet by mouth daily after breakfast.     Facility-Administered Medications Ordered in Other Visits  Medication Dose Route Frequency Provider Last Rate Last Admin  . acetaminophen (TYLENOL) tablet 650 mg  650 mg Oral Once Lequita Asal, MD      . acetaminophen (TYLENOL) tablet 650 mg  650 mg Oral Once Lequita Asal, MD      . acetaminophen (TYLENOL) tablet 650 mg  650 mg Oral Once Lequita Asal, MD      . acetaminophen (TYLENOL) tablet 650 mg  650 mg Oral Once Lequita Asal, MD      . acetaminophen (TYLENOL) tablet 650 mg  650 mg Oral Once Lequita Asal, MD      . diphenhydrAMINE (BENADRYL) capsule 25 mg  25 mg Oral Once Lequita Asal, MD      . diphenhydrAMINE (BENADRYL) capsule 25 mg  25 mg Oral Once Nolon Stalls C, MD      . diphenhydrAMINE (BENADRYL) capsule 25 mg  25 mg Oral Once Nolon Stalls C, MD      . diphenhydrAMINE (BENADRYL) capsule 25 mg  25 mg Oral Once Nolon Stalls C, MD      . diphenhydrAMINE (BENADRYL) capsule 25 mg  25 mg Oral Once Lequita Asal, MD          .  PHYSICAL EXAMINATION: ECOG PERFORMANCE STATUS: 2 - Symptomatic, <50% confined to bed  Vitals:   01/24/21 0925  BP: (!) 157/75  Pulse: (!) 120  Temp: 99 F (37.2 C)  SpO2: 94%   There were no vitals filed for this visit.  Physical Exam Constitutional:      Comments: Elderly male patient resting in the chair.  Accompanied by his wife/son.  Nauseated.   HENT:     Head: Normocephalic and atraumatic.     Mouth/Throat:     Pharynx: No oropharyngeal exudate.  Eyes:  Pupils: Pupils are equal, round, and reactive to light.  Cardiovascular:     Rate and Rhythm: Tachycardia present. Rhythm irregular.  Pulmonary:     Effort: No respiratory distress.     Breath sounds: No wheezing.     Comments: Decreased air entry bilaterally.  No wheeze or crackles. Abdominal:      General: Bowel sounds are normal. There is no distension.     Palpations: Abdomen is soft. There is no mass.     Tenderness: There is no abdominal tenderness. There is no guarding or rebound.  Musculoskeletal:        General: No tenderness. Normal range of motion.     Cervical back: Normal range of motion and neck supple.  Skin:    General: Skin is warm.  Neurological:     Mental Status: He is alert and oriented to person, place, and time.  Psychiatric:        Mood and Affect: Affect normal.      LABORATORY DATA:  I have reviewed the data as listed Lab Results  Component Value Date   WBC 155.0 (HH) 01/24/2021   HGB 6.8 (L) 01/24/2021   HCT 21.3 (L) 01/24/2021   MCV 88.0 01/24/2021   PLT 71 (L) 01/24/2021   Recent Labs    03/20/20 0920 04/17/20 0922 05/22/20 0905 06/19/20 0951 01/16/21 0830 01/20/21 0844 01/22/21 0906  NA 136 135 133*   < > 133* 136 139  K 4.4 4.3 4.1   < > 3.1* 3.3* 3.1*  CL 101 103 100   < > 99 99 104  CO2 25 24 24    < > 26 24 28   GLUCOSE 211* 194* 199*   < > 277* 228* 220*  BUN 23 27* 26*   < > 17 16 19   CREATININE 1.07 1.07 0.98   < > 1.33* 1.29* 1.31*  CALCIUM 8.7* 8.5* 8.7*   < > 8.8* 9.0 9.0  GFRNONAA >60 60* >60   < > 50* 52* 51*  GFRAA >60 >60 >60  --   --   --   --   PROT 7.1 7.0 7.0   < > 7.0 7.1 7.0  ALBUMIN 3.9 3.9 4.1   < > 3.6 3.9 3.8  AST 22 23 25    < > 44* 47* 48*  ALT 21 21 23    < > 40 40 41  ALKPHOS 30* 30* 31*   < > 49 57 51  BILITOT 0.9 0.5 0.9   < > 0.9 0.9 0.7   < > = values in this interval not displayed.    RADIOGRAPHIC STUDIES: I have personally reviewed the radiological images as listed and agreed with the findings in the report. No results found.  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  Hold Dacogen today-see acute issues below  # Worsening fatigue/  feeling weak/ Tachycardia-clinically suspicious for symptomatic A. fib with RVR.  Given life-threatening situation recommend emergent evaluation in the emergency room.   #Severe anemia hemoglobin 6.8-chronic 7-8; from underlying acute leukemia.  Patient will need blood transfusion in the hospital.  #Worsening cough/O2 saturations 94 at rest-patient would need infectious work-up/including chest x-ray-to rule out any acute respiratory process.   #I had a long discussion the patient/wife and 2 sons regarding the seriousness of the illness; and the urgency to address his heart rate.  EMS  was called/transported uneventfully.  Informed Dr. Rao-patient's primary oncologist  # DISPOSITION: # EMS to ER # follow up TBD-Dr.B  All questions were answered. The patient knows to call the clinic with any problems, questions or concerns.   Cammie Sickle, MD 01/24/2021 11:19 AM

## 2021-01-24 NOTE — Consult Note (Signed)
PHARMACY -  BRIEF ANTIBIOTIC NOTE   Pharmacy has received consult(s) for cefepime and vancomycin from an ED provider. Patient is also ordered metronidazole.  The patient's profile has been reviewed for ht/wt/allergies/indication/available labs.    One time order(s) placed for  --Vancomycin 1 g IV --Cefepime 2 g IV  Further antibiotics/pharmacy consults should be ordered by admitting physician if indicated.                       Thank you, Benita Gutter 01/24/2021  11:41 AM

## 2021-01-24 NOTE — ED Notes (Signed)
Patient to CT with RT x2 on BIPAP.

## 2021-01-24 NOTE — ED Notes (Signed)
Patient to ED11 from ED44 pending inpatient bed assignment.

## 2021-01-24 NOTE — Progress Notes (Signed)
RT assisted with patient transport to CT while patient on V60 BiPAP.

## 2021-01-24 NOTE — ED Provider Notes (Addendum)
[facility] Emergency Department Provider Note  Time seen: 11:39 AM  I have reviewed the triage vital signs and the nursing notes.   HISTORY  Chief Complaint Fever   HPI Paul Pham. is a 86 y.o. male with a past medical history of anemia, diabetes, hypertension, hyperlipidemia, leukemia currently receiving treatment presents to the emergency department for fever and a fast heart rate.  According to the patient he has had a subjective fever overnight measured to have a fever 100.1 this morning.  Patient found to be tachycardic at his infusion center where he was going for leukemia treatment and was sent to the emergency department.  Patient states he has had a wet cough for the last 1 to 2 weeks.  1 episode of vomiting this morning.  No diarrhea no abdominal pain or dysuria.   Past Medical History:  Diagnosis Date  . Anemia   . Arthritis   . BPH (benign prostatic hypertrophy)   . Cancer ([place])   . Complication of anesthesia    nausea  . Diabetes type 2, controlled ([place])   . HOH (hard of hearing)    r and L ears-70% loss per pt  . Hyperlipidemia   . Hypertension   . Myelodysplasia (myelodysplastic syndrome) ([place]) 2019    Patient Active Problem List   Diagnosis Date Noted  . Refractive error 12/31/2020  . Perceptive hearing loss, both sides 12/31/2020  . 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 Thrombocytopenia ([place]) 11/06/2020  . Leukocytosis 10/16/2020  . Contusion of lower leg 04/22/2020  . Leukopenia 01/08/2020  . Disorder of refraction and accommodation 01/24/2019  . Type 2 diabetes mellitus with hyperglycemia, without long-term current use of insulin ([place]) 01/24/2019  . Iron overload due to repeated red blood cell transfusions 11/26/2018  . 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 Symptomatic anemia 07/23/2018  . Encounter for antineoplastic chemotherapy 07/23/2018  . Goals of care, counseling/discussion  10/07/2017  . MDS (myelodysplastic syndrome) ([place]) 10/05/2017  . Neutropenia ([place])   . Anemia 08/17/2017  . Hard of hearing 01/18/2014  . HTN (hypertension) 01/18/2014    Past Surgical History:  Procedure Laterality Date  . APPENDECTOMY    . COLONOSCOPY  09/21/08   1 polyp found, tubular adenoma  . HAND SURGERY    . KNEE SURGERY Left     Prior to Admission medications   Medication Sig Start Date End Date Taking? Authorizing Provider  acyclovir (ZOVIRAX) 400 MG tablet Take 1 tablet (400 mg total) by mouth 2 (two) times daily. 01/21/21   Sindy Guadeloupe, MD  allopurinol (ZYLOPRIM) 300 MG tablet Take 0.5 tablets (150 mg total) by mouth daily. Patient taking differently: Take 150 mg by mouth daily. Advised by [facility] to take 1 tablet per day 12/11/20   Lequita Asal, MD  allopurinol (ZYLOPRIM) 300 MG tablet Take 1 tablet (300 mg total) by mouth daily. 01/15/21   Sindy Guadeloupe, MD  aspirin 81 MG chewable tablet Chew 81 mg by mouth daily.    [provider]  Calcium Carbonate-Vit D-Min (CALCIUM 1200 PO) Take 1,200 mg by mouth in the morning and at bedtime.    [provider]  Cranberry (THERACRAN PO) Take 1 tablet by mouth daily.     [provider]  cyanocobalamin 1000 MCG tablet Take 1,000 mcg by mouth daily.    [provider]  finasteride (PROSCAR) 5 MG tablet Take 5 mg by mouth daily.  [provider]  glimepiride (AMARYL) 1 MG tablet Take 1 tablet by mouth daily after breakfast. 01/24/19 01/07/21  [provider]  glucose blood test strip FreeStyle Lite Strips    [provider]  LANCETS ULTRA FINE MISC Lancets,Ultra Thin 26 gauge    [provider]  lidocaine-prilocaine (EMLA) cream Apply to affected area once 01/15/21   Sindy Guadeloupe, MD  lisinopril (PRINIVIL,ZESTRIL) 10 MG tablet Take 10 mg by mouth daily.    [provider]  loperamide (IMODIUM) 2 MG capsule Take 1 capsule (2 mg total) by mouth See  admin instructions. With onset of loose stool, take 4mg  followed by 2mg  every 2 hours until loose bowel movement stopped. Maximum: 16 mg/day 01/27/18   Earlie Server, MD  meclizine (ANTIVERT) 25 MG tablet Take 25 mg by mouth 2 (two) times daily as needed.    [provider]  meloxicam (MOBIC) 7.5 MG tablet Take 7.5 mg by mouth daily. 11/26/20   [provider]  metformin (FORTAMET) 1000 MG (OSM) 24 hr tablet Take 1,000 mg by mouth 2 (two) times daily with a meal.    [provider]  Multiple Vitamins-Minerals (CENTRUM SILVER PO) Take by mouth.    [provider]  mupirocin ointment (BACTROBAN) 2 % Apply 1 application topically daily. With dressing changes 01/09/20   Ralene Bathe, MD  Omega-3 Fatty Acids (FISH OIL PO) Take 1 tablet by mouth daily.     [provider]  ondansetron (ZOFRAN) 4 MG tablet Take 1 tablet (4 mg total) by mouth every 6 (six) hours as needed for nausea or vomiting. 07/18/19   Lequita Asal, MD  ondansetron (ZOFRAN) 8 MG tablet Take 1 tablet (8 mg total) by mouth 2 (two) times daily as needed (Nausea or vomiting). 01/15/21   Sindy Guadeloupe, MD  Polyethylene Glycol 3350 (MIRALAX PO) Take by mouth as needed.     [provider]  prochlorperazine (COMPAZINE) 10 MG tablet Take 1 tablet (10 mg total) by mouth every 6 (six) hours as needed (Nausea or vomiting). 01/15/21   Sindy Guadeloupe, MD  Saw Palmetto-Phytosterols (PROSTATE SR PO) Take 1 tablet by mouth daily.     [provider]  simvastatin (ZOCOR) 40 MG tablet Take 40 mg by mouth daily.    [provider]    No Known Allergies  Family History  Problem Relation Age of Onset  . Aneurysm Mother   . Heart disease Father   . Cancer Brother        skin  . Heart disease Brother   . Heart disease Brother   . Cancer Sister        skin  . Aneurysm Brother   . Heart disease Brother   . Heart disease Sister   . Heart disease Sister   . COPD Sister   .  Diabetes Sister     Social History Social History   Tobacco Use  . Smoking status: Never Smoker  . Smokeless tobacco: Never Used  Vaping Use  . Vaping Use: Never used  Substance Use Topics  . Alcohol use: No    Alcohol/week: 0.0 standard drinks  . Drug use: No    Review of Systems Constitutional: Subjective fever overnight, found to be febrile this morning. Cardiovascular: Negative for chest pain. Respiratory: Minimal shortness of breath.  Positive for wet cough x1 to 2 weeks Gastrointestinal: Negative for abdominal pain.  1 episode of vomiting this morning.  Occasional loose  stool. Genitourinary: Negative for urinary compaints Musculoskeletal: Negative for musculoskeletal complaints Neurological: Negative for headache All other ROS negative  ____________________________________________   PHYSICAL EXAM:  VITAL SIGNS: ED Triage Vitals  Enc Vitals Group     BP 01/24/21 1125 (!) 131/50     Pulse Rate 01/24/21 1125 (!) 119     Resp 01/24/21 1125 20     Temp 01/24/21 1125 100.1 F (37.8 C)     Temp Source 01/24/21 1125 Oral     SpO2 01/24/21 1125 92 %     Weight 01/24/21 1126 166 lb 14.2 oz (75.7 kg)     Height 01/24/21 1126 6\' 1"  (1.854 m)     Head Circumference --      Peak Flow --      Pain Score 01/24/21 1125 0     Pain Loc --      Pain Edu? --      Excl. in [place]? --     Constitutional: Patient is awake and alert, no distress sitting upright in bed. Eyes: Normal exam ENT      Head: Normocephalic and atraumatic.      Mouth/Throat: Mucous membranes are moist. Cardiovascular: Overall regular rhythm rate around 120 bpm. Respiratory: Mild tachypnea with frequent wet sounding cough.  Left-sided rhonchi on exam. Gastrointestinal: Soft and nontender. No distention.   Musculoskeletal: Nontender with normal range of motion in all extremities.  Neurologic:  Normal speech and language. No gross focal neurologic deficits Skin:  Skin is warm, dry and intact.   Psychiatric: Mood and affect are normal.  ____________________________________________    EKG  EKG viewed and interpreted by myself shows sinus tachycardia 120 bpm with a narrow QRS, normal axis, largely normal intervals and nonspecific ST changes.  ____________________________________________    RADIOLOGY  Chest x-ray negative for acute abnormality  ____________________________________________   INITIAL IMPRESSION / ASSESSMENT AND PLAN / ED COURSE  Pertinent labs & imaging results that were available during my care of the patient were reviewed by me and considered in my medical decision making (see chart for details).   Patient presents emergency department for cough fever tachycardia.  Patient meeting sepsis criteria.  We will check labs, cultures, started on broad-spectrum antibiotics while waiting for the results.  Given the patient's cough very high in the differential would be pneumonia versus COVID.  We will check cultures, labs, urine, chest x-ray, COVID swab.  Patient was started on broad-spectrum antibiotics.  Patient will require admission to the hospitalist service once his emergency department work-up is been completed.  Lab work has resulted showing an elevated lactic acid and elevated white blood cell count which is moderately elevated over baseline.  Hemoglobin is dropped to now 6.4 I have ordered a blood transfusion.  Potassium is 2.2 I have ordered potassium repletion.  Given the patient's fever tachycardia white blood cell count he meets sepsis criteria receiving broad-spectrum antibiotics.  COVID/flu is negative.  Patient will require admission to the hospitalist service for further work-up and treatment.  Paul Pham. was evaluated in Emergency Department on 01/24/2021 for the symptoms described in the history of present illness. He was evaluated in the context of the global COVID-19 pandemic, which necessitated consideration that the patient might be at risk for  infection with the SARS-CoV-2 virus that causes COVID-19. Institutional protocols and algorithms that pertain to the evaluation of patients at risk for COVID-19 are in a state of rapid change based on information released by regulatory bodies including the CDC  and federal and state organizations. These policies and algorithms were followed during the patient's care in the ED.  CRITICAL CARE Performed by: Harvest Dark   Total critical care time: 30 minutes  Critical care time was exclusive of separately billable procedures and treating other patients.  Critical care was necessary to treat or prevent imminent or life-threatening deterioration.  Critical care was time spent personally by me on the following activities: development of treatment plan with patient and/or surrogate as well as nursing, discussions with consultants, evaluation of patient's response to treatment, examination of patient, obtaining history from patient or surrogate, ordering and performing treatments and interventions, ordering and review of laboratory studies, ordering and review of radiographic studies, pulse oximetry and re-evaluation of patient's condition.   ----------------------------------------- 3:02 PM on 01/24/2021 -----------------------------------------  Patient is more restless appears to be having increased trouble breathing with increased wet sounding breath sounds.  Currently satting 91 to 92% on room air.  We will have the patient placed on BiPAP and monitor for improvement.  Hospitalist has admitted the patient to their service. ____________________________________________   FINAL CLINICAL IMPRESSION(S) / ED DIAGNOSES  Sepsis Hypokalemia Anemia   Harvest Dark, MD 01/24/21 1405    Harvest Dark, MD 01/24/21 [phone]

## 2021-01-24 NOTE — Consult Note (Signed)
CRITICAL CARE PROGRESS NOTE    Name: Paul Pham. MRN: [id number] DOB: 11/26/1925     LOS: 0  Referring physician: Dr Francine Graven  SUBJECTIVE FINDINGS & SIGNIFICANT EVENTS    Patient description:   Paul Belsito. is a 87 y.o. 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   Patient has a history of AML and is currently receiving chemotherapy.    Wife present and was able to provide details of history.  She shares patient has been declining for several months and has had chronic cough.  He is a never smoker.   He started feeling worse with weakness several months ago and has been progressively more weak with dyspnea even dressing with assistance requiring breaks in between clothes.   Labs are significant for anemia, lactate elevation mild metabolic acidosis. Vitals with fever Lines/tubes :   Microbiology/Sepsis markers: Results for orders placed or performed during the hospital encounter of 01/24/21  Resp Panel by RT-PCR (Flu A&B, Covid) Nasopharyngeal Swab     Status: None   Collection Time: 01/24/21 11:57 AM   Specimen: Nasopharyngeal Swab; Nasopharyngeal(NP) swabs in vial transport medium  Result Value Ref Range Status   SARS Coronavirus 2 by RT PCR NEGATIVE NEGATIVE Final    Comment: (NOTE) SARS-CoV-2 target nucleic acids are NOT DETECTED.  The SARS-CoV-2 RNA is generally detectable in upper respiratory specimens during the acute phase of infection. The lowest concentration of SARS-CoV-2 viral copies this assay can detect is 138 copies/mL. A negative result does not preclude SARS-Cov-2 infection and should not be used as the sole  basis for treatment or other patient management decisions. A negative result may occur with  improper specimen collection/handling, submission of specimen other than nasopharyngeal swab, presence of viral mutation(s) within the areas targeted by this assay, and inadequate number of viral copies(<138 copies/mL). A negative result must be combined with clinical observations, patient history, and epidemiological information. The expected result is Negative.  Fact Sheet for Patients:  EntrepreneurPulse.com.au  Fact Sheet for Healthcare Providers:  IncredibleEmployment.be  This test is no t yet approved or cleared by the [place] FDA and  has been authorized for detection and/or diagnosis of SARS-CoV-2 by FDA under an Emergency Use Authorization (EUA). This EUA will remain  in effect (meaning this test can be used) for the duration of the COVID-19 declaration under Section 564(b)(1) of the Act, 21 U.S.C.section 360bbb-3(b)(1), unless the authorization is terminated  or revoked sooner.       Influenza A by PCR NEGATIVE NEGATIVE Final   Influenza B by PCR NEGATIVE NEGATIVE Final    Comment: (NOTE) The Xpert Xpress SARS-CoV-2/FLU/RSV plus assay is intended as an aid in the diagnosis of influenza from Nasopharyngeal swab specimens and should not be used as a sole basis for treatment. Nasal washings and aspirates are unacceptable for Xpert Xpress SARS-CoV-2/FLU/RSV testing.  Fact Sheet for Patients: EntrepreneurPulse.com.au  Fact Sheet for Healthcare Providers: IncredibleEmployment.be  This test is not yet approved or cleared by the [place] FDA and has been authorized for detection and/or diagnosis of SARS-CoV-2 by FDA under an Emergency Use Authorization (EUA). This EUA will remain in effect (meaning this test can be used) for the duration of the COVID-19 declaration under Section 564(b)(1) of the Act,  21 U.S.C. section 360bbb-3(b)(1), unless the authorization is terminated or  revoked.  Performed at [facility], [address] [place], [place] [address]     Anti-infectives:  Anti-infectives (From admission, onward)   Start     Dose/Rate Route Frequency Ordered Stop   01/25/21 0000  cefTRIAXone (ROCEPHIN) 2 g in sodium chloride 0.9 % 100 mL IVPB        2 g 200 mL/hr over 30 Minutes Intravenous Every 24 hours 01/24/21 1459     01/24/21 1600  azithromycin (ZITHROMAX) 500 mg in sodium chloride 0.9 % 250 mL IVPB        500 mg 250 mL/hr over 60 Minutes Intravenous Every 24 hours 01/24/21 1459     01/24/21 1500  acyclovir (ZOVIRAX) tablet 400 mg        400 mg Oral 2 times daily 01/24/21 1459     01/24/21 1145  ceFEPIme (MAXIPIME) 2 g in sodium chloride 0.9 % 100 mL IVPB        2 g 200 mL/hr over 30 Minutes Intravenous  Once 01/24/21 1132 01/24/21 1301   01/24/21 1145  metroNIDAZOLE (FLAGYL) IVPB 500 mg        500 mg 100 mL/hr over 60 Minutes Intravenous  Once 01/24/21 1132 01/24/21 1301   01/24/21 1145  vancomycin (VANCOCIN) IVPB 1000 mg/200 mL premix        1,000 mg 200 mL/hr over 60 Minutes Intravenous  Once 01/24/21 1132 01/24/21 1501        PAST MEDICAL HISTORY   Past Medical History:  Diagnosis Date  . Anemia   . Arthritis   . BPH (benign prostatic hypertrophy)   . Cancer ([place])   . Complication of anesthesia    nausea  . Diabetes type 2, controlled ([place])   . HOH (hard of hearing)    r and L ears-70% loss per pt  . Hyperlipidemia   . Hypertension   . Myelodysplasia (myelodysplastic syndrome) ([place]) 2019     SURGICAL HISTORY   Past Surgical History:  Procedure Laterality Date  . APPENDECTOMY    . COLONOSCOPY  09/21/08   1 polyp found, tubular adenoma  . HAND SURGERY    . KNEE SURGERY Left      FAMILY HISTORY   Family History  Problem Relation Age of Onset  . Aneurysm Mother   . Heart disease Father   . Cancer Brother        skin  .  Heart disease Brother   . Heart disease Brother   . Cancer Sister        skin  . Aneurysm Brother   . Heart disease Brother   . Heart disease Sister   . Heart disease Sister   . COPD Sister   . Diabetes Sister      SOCIAL HISTORY   Social History   Tobacco Use  . Smoking status: Never Smoker  . Smokeless tobacco: Never Used  Vaping Use  . Vaping Use: Never used  Substance Use Topics  . Alcohol use: No    Alcohol/week: 0.0 standard drinks  . Drug use: No     MEDICATIONS   Current Medication:  Current Facility-Administered Medications:  .  0.9 %  sodium chloride infusion, 10 mL/hr, Intravenous, Once, Harvest Dark, MD .  acetaminophen (TYLENOL) tablet 650 mg, 650 mg, Oral, Q6H PRN **OR** acetaminophen (TYLENOL) suppository 650 mg, 650 mg, Rectal, Q6H PRN, Agbata, Tochukwu, MD .  acyclovir (ZOVIRAX) tablet 400 mg, 400 mg, Oral, BID, Agbata, Tochukwu, MD .  allopurinol (ZYLOPRIM) tablet  150 mg, 150 mg, Oral, Daily, Agbata, Tochukwu, MD .  azithromycin (ZITHROMAX) 500 mg in sodium chloride 0.9 % 250 mL IVPB, 500 mg, Intravenous, Q24H, Agbata, Tochukwu, MD .  Calcium 1200 1200-1000 MG-UNIT CHEW 1 tablet, 1 tablet, Oral, BID, Agbata, Tochukwu, MD .  Derrill Memo ON 01/25/2021] cefTRIAXone (ROCEPHIN) 2 g in sodium chloride 0.9 % 100 mL IVPB, 2 g, Intravenous, Q24H, Agbata, Tochukwu, MD .  finasteride (PROSCAR) tablet 5 mg, 5 mg, Oral, Daily, Agbata, Tochukwu, MD .  insulin aspart (novoLOG) injection 0-15 Units, 0-15 Units, Subcutaneous, TID WC, Agbata, Tochukwu, MD .  lidocaine-prilocaine (EMLA) cream, , Topical, Once, Agbata, Tochukwu, MD .  meclizine (ANTIVERT) tablet 25 mg, 25 mg, Oral, BID PRN, Agbata, Tochukwu, MD .  omega-3 acid ethyl esters (LOVAZA) capsule 1 g, 1 g, Oral, Daily, Agbata, Tochukwu, MD .  ondansetron (ZOFRAN) tablet 4 mg, 4 mg, Oral, Q6H PRN **OR** ondansetron (ZOFRAN) injection 4 mg, 4 mg, Intravenous, Q6H PRN, Agbata, Tochukwu, MD .  polyethylene glycol  (MIRALAX / GLYCOLAX) packet 17 g, 17 g, Oral, Daily PRN, Agbata, Tochukwu, MD .  potassium chloride SA (KLOR-CON) CR tablet 40 mEq, 40 mEq, Oral, BID, Agbata, Tochukwu, MD .  simvastatin (ZOCOR) tablet 40 mg, 40 mg, Oral, Daily, Agbata, Tochukwu, MD .  vitamin B-12 (CYANOCOBALAMIN) tablet 1,000 mcg, 1,000 mcg, Oral, Daily, Agbata, Tochukwu, MD  Current Outpatient Medications:  .  acyclovir (ZOVIRAX) 400 MG tablet, Take 1 tablet (400 mg total) by mouth 2 (two) times daily., Disp: 60 tablet, Rfl: 3 .  allopurinol (ZYLOPRIM) 300 MG tablet, Take 0.5 tablets (150 mg total) by mouth daily. (Patient taking differently: Take 150 mg by mouth daily. Advised by [facility] to take 1 tablet per day), Disp: 30 tablet, Rfl: 0 .  allopurinol (ZYLOPRIM) 300 MG tablet, Take 1 tablet (300 mg total) by mouth daily., Disp: 30 tablet, Rfl: 2 .  aspirin 81 MG chewable tablet, Chew 81 mg by mouth daily., Disp: , Rfl:  .  Calcium Carbonate-Vit D-Min (CALCIUM 1200 PO), Take 1,200 mg by mouth in the morning and at bedtime., Disp: , Rfl:  .  Cranberry (THERACRAN PO), Take 1 tablet by mouth daily. , Disp: , Rfl:  .  cyanocobalamin 1000 MCG tablet, Take 1,000 mcg by mouth daily., Disp: , Rfl:  .  finasteride (PROSCAR) 5 MG tablet, Take 5 mg by mouth daily., Disp: , Rfl:  .  glimepiride (AMARYL) 1 MG tablet, Take 1 tablet by mouth daily after breakfast., Disp: , Rfl:  .  glucose blood test strip, FreeStyle Lite Strips, Disp: , Rfl:  .  LANCETS ULTRA FINE MISC, Lancets,Ultra Thin 26 gauge, Disp: , Rfl:  .  lidocaine-prilocaine (EMLA) cream, Apply to affected area once, Disp: 30 g, Rfl: 3 .  lisinopril (PRINIVIL,ZESTRIL) 10 MG tablet, Take 10 mg by mouth daily., Disp: , Rfl:  .  loperamide (IMODIUM) 2 MG capsule, Take 1 capsule (2 mg total) by mouth See admin instructions. With onset of loose stool, take 73m followed by 259mevery 2 hours until loose bowel movement stopped. Maximum: 16 mg/day, Disp: 30 capsule, Rfl: 0 .  meclizine  (ANTIVERT) 25 MG tablet, Take 25 mg by mouth 2 (two) times daily as needed., Disp: , Rfl:  .  meloxicam (MOBIC) 7.5 MG tablet, Take 7.5 mg by mouth daily., Disp: , Rfl:  .  metformin (FORTAMET) 1000 MG (OSM) 24 hr tablet, Take 1,000 mg by mouth 2 (two) times daily with a meal., Disp: , Rfl:  .  Multiple Vitamins-Minerals (CENTRUM SILVER PO), Take by mouth., Disp: , Rfl:  .  mupirocin ointment (BACTROBAN) 2 %, Apply 1 application topically daily. With dressing changes, Disp: 22 g, Rfl: 0 .  Omega-3 Fatty Acids (FISH OIL PO), Take 1 tablet by mouth daily. , Disp: , Rfl:  .  ondansetron (ZOFRAN) 4 MG tablet, Take 1 tablet (4 mg total) by mouth every 6 (six) hours as needed for nausea or vomiting., Disp: 30 tablet, Rfl: 2 .  ondansetron (ZOFRAN) 8 MG tablet, Take 1 tablet (8 mg total) by mouth 2 (two) times daily as needed (Nausea or vomiting)., Disp: 30 tablet, Rfl: 1 .  Polyethylene Glycol 3350 (MIRALAX PO), Take by mouth as needed. , Disp: , Rfl:  .  prochlorperazine (COMPAZINE) 10 MG tablet, Take 1 tablet (10 mg total) by mouth every 6 (six) hours as needed (Nausea or vomiting)., Disp: 30 tablet, Rfl: 1 .  Saw [place]-Phytosterols (PROSTATE SR PO), Take 1 tablet by mouth daily. , Disp: , Rfl:  .  simvastatin (ZOCOR) 40 MG tablet, Take 40 mg by mouth daily., Disp: , Rfl:   Facility-Administered Medications Ordered in Other Encounters:  .  acetaminophen (TYLENOL) tablet 650 mg, 650 mg, Oral, Once, Corcoran, Melissa C, MD .  acetaminophen (TYLENOL) tablet 650 mg, 650 mg, Oral, Once, Corcoran, Melissa C, MD .  acetaminophen (TYLENOL) tablet 650 mg, 650 mg, Oral, Once, Corcoran, Melissa C, MD .  acetaminophen (TYLENOL) tablet 650 mg, 650 mg, Oral, Once, Corcoran, Melissa C, MD .  acetaminophen (TYLENOL) tablet 650 mg, 650 mg, Oral, Once, Corcoran, Melissa C, MD .  diphenhydrAMINE (BENADRYL) capsule 25 mg, 25 mg, Oral, Once, Corcoran, Melissa C, MD .  diphenhydrAMINE (BENADRYL) capsule 25 mg, 25 mg,  Oral, Once, Corcoran, Melissa C, MD .  diphenhydrAMINE (BENADRYL) capsule 25 mg, 25 mg, Oral, Once, Corcoran, Melissa C, MD .  diphenhydrAMINE (BENADRYL) capsule 25 mg, 25 mg, Oral, Once, Corcoran, Melissa C, MD .  diphenhydrAMINE (BENADRYL) capsule 25 mg, 25 mg, Oral, Once, Corcoran, Drue Second, MD    ALLERGIES   Patient has no known allergies.    REVIEW OF SYSTEMS     Unable to obtain due to acutely ill state on BIPAP  PHYSICAL EXAMINATION   Vital Signs: Temp:  [99 F (37.2 C)-100.1 F (37.8 C)] 99.1 F (37.3 C) (05/27 1400) Pulse Rate:  [119-133] 133 (05/27 1430) Resp:  [20-29] 29 (05/27 1430) BP: (115-157)/(40-75) 130/58 (05/27 1430) SpO2:  [91 %-96 %] 93 % (05/27 1430) Weight:  [75.7 kg] 75.7 kg (05/27 1126)  GENERAL:age appropriate in mild distress due to resp failure HEAD: Normocephalic, atraumatic.  EYES: Pupils equal, round, reactive to light.  No scleral icterus.  MOUTH: Moist mucosal membrane. NECK: Supple. No thyromegaly. No nodules. No JVD.  PULMONARY: rhonchi bilaterally  CARDIOVASCULAR: S1 and S2. Regular rate and rhythm. No murmurs, rubs, or gallops.  GASTROINTESTINAL: Soft, nontender, non-distended. No masses. Positive bowel sounds. No hepatosplenomegaly.  MUSCULOSKELETAL: No swelling, clubbing, or edema.  NEUROLOGIC: Mild distress due to acute illness SKIN:intact,warm,dry   PERTINENT DATA     Infusions: . sodium chloride    . azithromycin    . [START ON 01/25/2021] cefTRIAXone (ROCEPHIN)  IV     Scheduled Medications: . acyclovir  400 mg Oral BID  . allopurinol  150 mg Oral Daily  . Calcium 1200  1 tablet Oral BID  . finasteride  5 mg Oral Daily  . insulin aspart  0-15 Units Subcutaneous TID WC  .  lidocaine-prilocaine   Topical Once  . omega-3 acid ethyl esters  1 g Oral Daily  . potassium chloride  40 mEq Oral BID  . simvastatin  40 mg Oral Daily  . cyanocobalamin  1,000 mcg Oral Daily   PRN Medications: acetaminophen **OR**  acetaminophen, meclizine, ondansetron **OR** ondansetron (ZOFRAN) IV, polyethylene glycol Hemodynamic parameters:   Intake/Output: No intake/output data recorded.  Ventilator  Settings:     LAB RESULTS:  Basic Metabolic Panel: Recent Labs  Lab 01/20/21 0844 01/22/21 0906 01/24/21 1157  NA 136 139 135  K 3.3* 3.1* 2.2*  CL 99 104 99  CO2 '24 28 22  ' GLUCOSE 228* 220* 248*  BUN '16 19 19  ' CREATININE 1.29* 1.31* 1.59*  CALCIUM 9.0 9.0 8.8*   Liver Function Tests: Recent Labs  Lab 01/20/21 0844 01/22/21 0906 01/24/21 1157  AST 47* 48* 50*  ALT 40 41 40  ALKPHOS 57 51 53  BILITOT 0.9 0.7 0.8  PROT 7.1 7.0 6.9  ALBUMIN 3.9 3.8 3.6   No results for input(s): LIPASE, AMYLASE in the last 168 hours. No results for input(s): AMMONIA in the last 168 hours. CBC: Recent Labs  Lab 01/20/21 0844 01/22/21 0906 01/24/21 0849 01/24/21 1157  WBC 166.5* 159.5* 155.0* 181.6*  NEUTROABS 69.1* 70.6* 97.7* 86.8*  HGB 6.7* 7.4* 6.8* 6.4*  HCT 20.8* 23.6* 21.3* 19.7*  MCV 86.7 87.7 88.0 87.6  PLT 98* 81* 71* 72*   Cardiac Enzymes: No results for input(s): CKTOTAL, CKMB, CKMBINDEX, TROPONINI in the last 168 hours. BNP: Invalid input(s): POCBNP CBG: No results for input(s): GLUCAP in the last 168 hours.     IMAGING RESULTS:  Imaging: DG Chest Port 1 View  Result Date: 01/24/2021 CLINICAL DATA:  Sepsis. EXAM: PORTABLE CHEST 1 VIEW COMPARISON:  November 08, 2006. FINDINGS: The heart size and mediastinal contours are within normal limits. Mild bibasilar subsegmental atelectasis is noted. The visualized skeletal structures are unremarkable. IMPRESSION: Mild bibasilar subsegmental atelectasis. Aortic Atherosclerosis (ICD10-I70.0). Electronically Signed   By: Marijo Conception M.D.   On: 01/24/2021 12:24   '@[email or web address]' @ DG Chest Port 1 View  Result Date: 01/24/2021 CLINICAL DATA:  Sepsis. EXAM: PORTABLE CHEST 1 VIEW COMPARISON:  November 08, 2006. FINDINGS: The heart size and mediastinal  contours are within normal limits. Mild bibasilar subsegmental atelectasis is noted. The visualized skeletal structures are unremarkable. IMPRESSION: Mild bibasilar subsegmental atelectasis. Aortic Atherosclerosis (ICD10-I70.0). Electronically Signed   By: Marijo Conception M.D.   On: 01/24/2021 12:24      ASSESSMENT AND PLAN    -Multidisciplinary rounds held today  Acute hypoxemic respiratory failure - present on admission - due to bilateral multifocal pneumonia  - COVID19 -negative  - supplemental O2 during my evaluation -BIPAP -50% - will perform infectious workup for pneumonia -Respiratory viral panel -serum fungitell -legionella ab -strep pneumoniae ur AG -Histoplasma Ur Ag -sputum resp cultures -AFB sputum expectorated specimen -sputum cytology  -reviewed pertinent imaging with patient today - ESR -PT/OT for d/c planning  -s/p cefepime/vancomycin now on rocephin zithromax -please encourage patient to use incentive spirometer few times each hour while hospitalized.     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 Garnette Scheuermann was  to proceed with Dacogen.      Acute on chronic anemia   - per heme/onc patient was being considered for transfusion    - h/b <7   - will transfuse 2 units irradiated blood    -heme/onc consultation  Renal Failure-most likely due to ATN -follow chem 7 -follow UO -continue Foley Catheter-assess need daily -IVF - NS [id number]    ID -continue IV abx as prescibed -follow up cultures  GI/Nutrition GI PROPHYLAXIS as indicated DIET-->TF's as tolerated Constipation protocol as indicated  ENDO - ICU hypoglycemic\Hyperglycemia protocol -check FSBS per  protocol   ELECTROLYTES -follow labs as needed -replace as needed -pharmacy consultation   DVT/GI PRX ordered -SCDs  TRANSFUSIONS AS NEEDED MONITOR FSBS ASSESS the need for LABS as needed   Critical care provider statement:    Critical care time (minutes):  109   Critical care time was exclusive of:  Separately billable procedures and treating other patients   Critical care was necessary to treat or prevent imminent or life-threatening deterioration of the following conditions:  Acute hypoxemic respiratory failure, pneumonia   Critical care was time spent personally by me on the following activities:  Development of treatment plan with patient or surrogate, discussions with consultants, evaluation of patient's response to treatment, examination of patient, obtaining history from patient or surrogate, ordering and performing treatments and interventions, ordering and review of laboratory studies and re-evaluation of patient's condition.  I assumed direction of critical care for this patient from another provider in my specialty: no    This document was prepared using Dragon voice recognition software and may include unintentional dictation errors.    Ottie Glazier, M.D.  Division of [place]

## 2021-01-24 NOTE — ED Notes (Signed)
Blood bank questioning orders for blood products for patient concerning irradiated blood vs CMV - blood. Blood bank staff put in touch with ordering/admitting providers to clarify which blood products the patient needs at this time.

## 2021-01-24 NOTE — ED Notes (Signed)
Family at bedside and patient updated on POC. Patient awaiting inpatient bed assignment. This RN in process of obtaining blood products from blood bank being notified that irradiated/CMV- blood is ready.

## 2021-01-24 NOTE — ED Notes (Signed)
Patient reports nausea. IV Zofran provided per [facility] as a PRN. Patient remains NPO, and on BIPAP.

## 2021-01-24 NOTE — Sepsis Progress Note (Signed)
[facility] will follow with the sepsis protocol

## 2021-01-24 NOTE — ED Notes (Signed)
Patient and family updated on POC. Patient continues to wait on inpatient bed assignment. This RN explained delay. Patient and family verbalized understanding. No needs expressed.

## 2021-01-25 ENCOUNTER — Inpatient Hospital Stay: Payer: Medicare Other

## 2021-01-25 ENCOUNTER — Inpatient Hospital Stay (HOSPITAL_COMMUNITY)
Admit: 2021-01-25 | Discharge: 2021-01-25 | Disposition: A | Discharge status: Home / Self Care | Payer: Medicare Other | Attending: Pulmonary Disease | Admitting: Pulmonary Disease

## 2021-01-25 DIAGNOSIS — A419 Sepsis, unspecified organism: Secondary | ICD-10-CM | POA: Diagnosis not present

## 2021-01-25 DIAGNOSIS — C9200 Acute myeloblastic leukemia, not having achieved remission: Secondary | ICD-10-CM

## 2021-01-25 DIAGNOSIS — D630 Anemia in neoplastic disease: Secondary | ICD-10-CM

## 2021-01-25 DIAGNOSIS — N179 Acute kidney failure, unspecified: Secondary | ICD-10-CM

## 2021-01-25 DIAGNOSIS — N183 Chronic kidney disease, stage 3 unspecified: Secondary | ICD-10-CM | POA: Diagnosis present

## 2021-01-25 DIAGNOSIS — I5021 Acute systolic (congestive) heart failure: Secondary | ICD-10-CM | POA: Diagnosis not present

## 2021-01-25 DIAGNOSIS — J9600 Acute respiratory failure, unspecified whether with hypoxia or hypercapnia: Secondary | ICD-10-CM | POA: Diagnosis not present

## 2021-01-25 LAB — CBC
HCT: 22.8 % — ABNORMAL LOW (ref 39.0–52.0)
Hemoglobin: 7.6 g/dL — ABNORMAL LOW (ref 13.0–17.0)
MCH: 28.1 pg (ref 26.0–34.0)
MCHC: 33.3 g/dL (ref 30.0–36.0)
MCV: 84.4 fL (ref 80.0–100.0)
Platelets: 77 10*3/uL — ABNORMAL LOW (ref 150–400)
RBC: 2.7 MIL/uL — ABNORMAL LOW (ref 4.22–5.81)
RDW: 18 % — ABNORMAL HIGH (ref 11.5–15.5)
WBC: 167.7 10*3/uL (ref 4.0–10.5)
nRBC: 0.3 % — ABNORMAL HIGH (ref 0.0–0.2)

## 2021-01-25 LAB — RESPIRATORY PANEL BY PCR

## 2021-01-25 LAB — PROTIME-INR
INR: 1.5 — ABNORMAL HIGH (ref 0.8–1.2)
Prothrombin Time: 18.5 seconds — ABNORMAL HIGH (ref 11.4–15.2)

## 2021-01-25 LAB — PHOSPHORUS: Phosphorus: 4.1 mg/dL (ref 2.5–4.6)

## 2021-01-25 LAB — ECHOCARDIOGRAM COMPLETE
AV Peak grad: 8.8 mmHg
Ao pk vel: 1.48 m/s
Area-P 1/2: 5.16 cm2
Height: 73 in
P 1/2 time: 520 msec
S' Lateral: 2.72 cm
Weight: 2670.21 oz

## 2021-01-25 LAB — CBG MONITORING, ED
Glucose-Capillary: 180 mg/dL — ABNORMAL HIGH (ref 70–99)
Glucose-Capillary: 200 mg/dL — ABNORMAL HIGH (ref 70–99)
Glucose-Capillary: 199 mg/dL — ABNORMAL HIGH (ref 70–99)
Glucose-Capillary: 232 mg/dL — ABNORMAL HIGH (ref 70–99)

## 2021-01-25 LAB — HEMOGLOBIN AND HEMATOCRIT, BLOOD
HCT: 20.7 % — ABNORMAL LOW (ref 39.0–52.0)
HCT: 23.7 % — ABNORMAL LOW (ref 39.0–52.0)
Hemoglobin: 6.8 g/dL — ABNORMAL LOW (ref 13.0–17.0)
Hemoglobin: 8 g/dL — ABNORMAL LOW (ref 13.0–17.0)

## 2021-01-25 LAB — GLUCOSE, CAPILLARY: Glucose-Capillary: 231 mg/dL — ABNORMAL HIGH (ref 70–99)

## 2021-01-25 LAB — BASIC METABOLIC PANEL
Anion gap: 10 (ref 5–15)
BUN: 30 mg/dL — ABNORMAL HIGH (ref 8–23)
CO2: 23 mmol/L (ref 22–32)
Calcium: 8.1 mg/dL — ABNORMAL LOW (ref 8.9–10.3)
Chloride: 102 mmol/L (ref 98–111)
Creatinine, Ser: 2.01 mg/dL — ABNORMAL HIGH (ref 0.61–1.24)
GFR, Estimated: 31 mL/min — ABNORMAL LOW (ref >60)
Glucose, Bld: 215 mg/dL — ABNORMAL HIGH (ref 70–99)
Potassium: 4.3 mmol/L (ref 3.5–5.1)
Sodium: 135 mmol/L (ref 135–145)

## 2021-01-25 LAB — CRYPTOCOCCAL ANTIGEN: Crypto Ag: NEGATIVE

## 2021-01-25 LAB — STREP PNEUMONIAE URINARY ANTIGEN: Strep Pneumo Urinary Antigen: NEGATIVE

## 2021-01-25 LAB — PREPARE RBC (CROSSMATCH)

## 2021-01-25 LAB — PROCALCITONIN: Procalcitonin: 7.85 ng/mL

## 2021-01-25 LAB — MRSA PCR SCREENING: MRSA by PCR: NEGATIVE

## 2021-01-25 LAB — MAGNESIUM: Magnesium: 2 mg/dL (ref 1.7–2.4)

## 2021-01-25 MED ORDER — POTASSIUM CHLORIDE CRYS ER 20 MEQ PO TBCR
20.0000 meq | EXTENDED_RELEASE_TABLET | Freq: Two times a day (BID) | ORAL | Status: DC
  Administered 2021-01-25 – 2021-01-28 (×7): 20 meq via ORAL
  Filled 2021-01-25 (×7): qty 1

## 2021-01-25 MED ORDER — LORAZEPAM 2 MG/ML IJ SOLN: 1.0000 mg | Freq: Once | INTRAMUSCULAR | Status: AC

## 2021-01-25 MED ORDER — LORAZEPAM 2 MG/ML IJ SOLN
INTRAMUSCULAR | Status: AC
  Administered 2021-01-25: 1 mg via INTRAVENOUS
  Filled 2021-01-25: qty 1

## 2021-01-25 MED ORDER — LORAZEPAM 2 MG/ML IJ SOLN
0.5000 mg | Freq: Once | INTRAMUSCULAR | Status: AC
  Administered 2021-01-25: 0.5 mg via INTRAVENOUS
  Filled 2021-01-25: qty 1

## 2021-01-25 MED ORDER — HALOPERIDOL LACTATE 5 MG/ML IJ SOLN
5.0000 mg | Freq: Four times a day (QID) | INTRAMUSCULAR | Status: DC | PRN
  Filled 2021-01-25: qty 1

## 2021-01-25 NOTE — Progress Notes (Signed)
PROGRESS NOTE    [name].  DXI:[id number] DOB: 10/09/1925 DOA: 01/24/2021 PCP: Lequita Asal, MD    Brief Narrative:  87 year old male with a history of AML, diabetes, CKD stage IIIa, admitted to the hospital with sepsis secondary to multifocal pneumonia.  He has been started on intravenous antibiotics.  Initially required BiPAP.  He has metabolic encephalopathy secondary to sepsis.  Noted to have significant anemia in the setting of AML, he was transfused 2 units of PRBC.  With multiple comorbidities, advanced age and general frailty, his long-term prognosis is poor   Assessment & Plan:   Principal Problem:   Sepsis ([place]) Active Problems:   Anemia in neoplastic disease   Hard of hearing   HTN (hypertension)   Type 2 diabetes mellitus with hyperglycemia, without long-term current use of insulin (HCC)   Leukocytosis   Thrombocytopenia (HCC)   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HCC)   Hypokalemia   Acute respiratory failure (HCC)   CKD (chronic kidney disease) stage 3, GFR 30-59 ml/min (HCC)   AKI (acute kidney injury) ([place])   Severe sepsis -Patient noted to have tachypnea, tachycardia on admission with source of infection being pneumonia.  Lactic acid was elevated -Blood cultures been sent -Currently on antibiotics -Continue to follow hemodynamics and fever curve  Acute respiratory failure with hypoxia -Noted to have increased work of breathing on admission requiring BiPAP -He has since been weaned off of BiPAP and is on 5 L nasal cannula -We will continue to wean off oxygen as tolerated -Pulmonology following, appreciate assistance  Community-acquired pneumonia -Noted on CT chest to have bilateral multifocal pneumonia, most severe in the right lower lobe -Continue on ceftriaxone and azithromycin -Follow-up urinary antigens and sputum culture  Anemia -Likely related to AML -He has required blood transfusions in the past -Hemoglobin 6.4  on admission -He was transfused 2 units PRBC with follow-up hemoglobin 8.0 -Continue to follow and transfuse for hemoglobin less than 7  Thrombocytopenia -Secondary to AML in the setting of sepsis -Continue to monitor  Leukocytosis -Related to AML  Acute kidney injury on chronic kidney disease stage IIIa -Baseline creatinine around 1.3 -Creatinine has risen to 2.0 -Suspect this is related to sepsis and anemia -Continue to follow urine output and renal function -Check renal ultrasound -Hold outpatient lisinopril  Diabetes -Holding oral agents -Continue on sliding scale insulin  Hypokalemia/hypomagnesemia -Replaced  AML -Followed by oncology has been receiving treatment recently -Family understands that this is not curative  Acute metabolic encephalopathy -Suspect is related to sepsis -Normally, family reports that he is alert and oriented -Continue to monitor  Goals of care -With patient's multiple comorbidities, advanced age and overall frail condition, his long-term prognosis with his current illness appears to be poor -This was explained to family and they verbalized understanding -They wish to continue treatments for less than 24 hours to monitor for any improvements -If patient does have any meaningful improvements, would consider de-escalating care   DVT prophylaxis: SCDs Start: 01/24/21 1457  Code Status: DNR Family Communication: Discussed with patient's son and wife at the bedside Disposition Plan: Status is: Inpatient  Remains inpatient appropriate because:Altered mental status, IV treatments appropriate due to intensity of illness or inability to take PO and Inpatient level of care appropriate due to severity of illness   Dispo: The patient is from: Home              Anticipated d/c is to: TBD  Patient currently is not medically stable to d/c.   Difficult to place patient No    Consultants:   Pulmonology  Procedures:      Antimicrobials:   Ceftriaxone  Azithromycin   Subjective: Patient is seen lying in bed.  Family is at bedside.  He has mitts on his hands.  He has been trying to get out of bed.  He is confused.  Objective: Vitals:   01/25/21 1500 01/25/21 1530 01/25/21 1630 01/25/21 1700  BP: (!) 114/51 (!) 124/47 (!) 116/48 (!) 104/44  Pulse: (!) 104 (!) 106 (!) 108 (!) 101  Resp: (!) 21 19 (!) 21 19  Temp:      TempSrc:      SpO2: 92% 93% 94% 96%  Weight:      Height:        Intake/Output Summary (Last 24 hours) at 01/25/2021 1824 Last data filed at 01/25/2021 8527 Gross per 24 hour  Intake 1730 ml  Output --  Net 1730 ml   Filed Weights   01/24/21 1126  Weight: 75.7 kg    Examination:  General exam: Appears calm and comfortable  Respiratory system: bilateral rhonchi. Respiratory effort normal. Cardiovascular system: S1 & S2 heard, RRR. No JVD, murmurs, rubs, gallops or clicks. No pedal edema. Gastrointestinal system: Abdomen is nondistended, soft and nontender. No organomegaly or masses felt. Normal bowel sounds heard. Central nervous system:  No focal neurological deficits. Extremities: Symmetric 5 x 5 power. Skin: No rashes, lesions or ulcers Psychiatry: confused and agitated    Data Reviewed: I have personally reviewed following labs and imaging studies  CBC: Recent Labs  Lab 01/20/21 0844 01/22/21 0906 01/24/21 0849 01/24/21 1157 01/25/21 0135 01/25/21 0625 01/25/21 0833  WBC 166.5* 159.5* 155.0* 181.6*  --  167.7*  --   NEUTROABS 69.1* 70.6* 97.7* 86.8*  --   --   --   HGB 6.7* 7.4* 6.8* 6.4* 6.8* 7.6* 8.0*  HCT 20.8* 23.6* 21.3* 19.7* 20.7* 22.8* 23.7*  MCV 86.7 87.7 88.0 87.6  --  84.4  --   PLT 98* 81* 71* 72*  --  77*  --    Basic Metabolic Panel: Recent Labs  Lab 01/20/21 0844 01/22/21 0906 01/24/21 1157 01/24/21 1644 01/25/21 0625  NA 136 139 135  --  135  K 3.3* 3.1* 2.2* 2.9* 4.3  CL 99 104 99  --  102  CO2 24 28 22   --  23  GLUCOSE  228* 220* 248*  --  215*  BUN 16 19 19   --  30*  CREATININE 1.29* 1.31* 1.59*  --  2.01*  CALCIUM 9.0 9.0 8.8*  --  8.1*  MG  --   --   --  1.6* 2.0  PHOS  --   --   --  3.1 4.1   GFR: Estimated Creatinine Clearance: 25.1 mL/min (A) (by C-G formula based on SCr of 2.01 mg/dL (H)). Liver Function Tests: Recent Labs  Lab 01/20/21 0844 01/22/21 0906 01/24/21 1157  AST 47* 48* 50*  ALT 40 41 40  ALKPHOS 57 51 53  BILITOT 0.9 0.7 0.8  PROT 7.1 7.0 6.9  ALBUMIN 3.9 3.8 3.6   No results for input(s): LIPASE, AMYLASE in the last 168 hours. No results for input(s): AMMONIA in the last 168 hours. Coagulation Profile: Recent Labs  Lab 01/24/21 1157 01/25/21 0625  INR 1.4* 1.5*   Cardiac Enzymes: No results for input(s): CKTOTAL, CKMB, CKMBINDEX, TROPONINI in the last 168 hours.  BNP (last 3 results) No results for input(s): PROBNP in the last 8760 hours. HbA1C: Recent Labs    01/24/21 1553  HGBA1C 7.6*   CBG: Recent Labs  Lab 01/24/21 2104 01/25/21 0129 01/25/21 0539 01/25/21 0757 01/25/21 1548  GLUCAP 147* 180* 200* 199* 232*   Lipid Profile: No results for input(s): CHOL, HDL, LDLCALC, TRIG, CHOLHDL, LDLDIRECT in the last 72 hours. Thyroid Function Tests: No results for input(s): TSH, T4TOTAL, FREET4, T3FREE, THYROIDAB in the last 72 hours. Anemia Panel: No results for input(s): VITAMINB12, FOLATE, FERRITIN, TIBC, IRON, RETICCTPCT in the last 72 hours. Sepsis Labs: Recent Labs  Lab 01/24/21 1157 01/24/21 1450 01/24/21 1643 01/25/21 0625  PROCALCITON  --   --   --  7.85  LATICACIDVEN 3.2* 3.0* 5.0*  --     Recent Results (from the past 240 hour(s))  Resp Panel by RT-PCR (Flu A&B, Covid) Nasopharyngeal Swab     Status: None   Collection Time: 01/24/21 11:57 AM   Specimen: Nasopharyngeal Swab; Nasopharyngeal(NP) swabs in vial transport medium  Result Value Ref Range Status   SARS Coronavirus 2 by RT PCR NEGATIVE NEGATIVE Final    Comment:  (NOTE) SARS-CoV-2 target nucleic acids are NOT DETECTED.  The SARS-CoV-2 RNA is generally detectable in upper respiratory specimens during the acute phase of infection. The lowest concentration of SARS-CoV-2 viral copies this assay can detect is 138 copies/mL. A negative result does not preclude SARS-Cov-2 infection and should not be used as the sole basis for treatment or other patient management decisions. A negative result may occur with  improper specimen collection/handling, submission of specimen other than nasopharyngeal swab, presence of viral mutation(s) within the areas targeted by this assay, and inadequate number of viral copies(<138 copies/mL). A negative result must be combined with clinical observations, patient history, and epidemiological information. The expected result is Negative.  Fact Sheet for Patients:  EntrepreneurPulse.com.au  Fact Sheet for Healthcare Providers:  IncredibleEmployment.be  This test is no t yet approved or cleared by the [place] FDA and  has been authorized for detection and/or diagnosis of SARS-CoV-2 by FDA under an Emergency Use Authorization (EUA). This EUA will remain  in effect (meaning this test can be used) for the duration of the COVID-19 declaration under Section 564(b)(1) of the Act, 21 U.S.C.section 360bbb-3(b)(1), unless the authorization is terminated  or revoked sooner.       Influenza A by PCR NEGATIVE NEGATIVE Final   Influenza B by PCR NEGATIVE NEGATIVE Final    Comment: (NOTE) The Xpert Xpress SARS-CoV-2/FLU/RSV plus assay is intended as an aid in the diagnosis of influenza from Nasopharyngeal swab specimens and should not be used as a sole basis for treatment. Nasal washings and aspirates are unacceptable for Xpert Xpress SARS-CoV-2/FLU/RSV testing.  Fact Sheet for Patients: EntrepreneurPulse.com.au  Fact Sheet for Healthcare  Providers: IncredibleEmployment.be  This test is not yet approved or cleared by the [place] FDA and has been authorized for detection and/or diagnosis of SARS-CoV-2 by FDA under an Emergency Use Authorization (EUA). This EUA will remain in effect (meaning this test can be used) for the duration of the COVID-19 declaration under Section 564(b)(1) of the Act, 21 U.S.C. section 360bbb-3(b)(1), unless the authorization is terminated or revoked.  Performed at [facility], [address] [place],  [address]   Blood Culture (routine x 2)     Status: None (Preliminary result)   Collection Time: 01/24/21 11:57 AM   Specimen: BLOOD  Result Value Ref  Range Status   Specimen Description BLOOD BLOOD LEFT HAND  Final   Special Requests   Final    BOTTLES DRAWN AEROBIC AND ANAEROBIC Blood Culture adequate volume   Culture   Final    NO GROWTH < 24 HOURS Performed at [facility], [address] [place], [place] [address]    Report Status PENDING  Incomplete  Blood Culture (routine x 2)     Status: None (Preliminary result)   Collection Time: 01/24/21 11:57 AM   Specimen: BLOOD  Result Value Ref Range Status   Specimen Description BLOOD BLOOD LEFT ARM  Final   Special Requests   Final    BOTTLES DRAWN AEROBIC AND ANAEROBIC Blood Culture adequate volume   Culture   Final    NO GROWTH < 24 HOURS Performed at [facility], [address], [place], [place] [address]    Report Status PENDING  Incomplete  MRSA PCR Screening     Status: None   Collection Time: 01/24/21  4:06 PM   Specimen: Nasal Mucosa; Nasopharyngeal  Result Value Ref Range Status   MRSA by PCR NEGATIVE NEGATIVE Final    Comment:        The GeneXpert MRSA Assay (FDA approved for NASAL specimens only), is one component of a comprehensive MRSA colonization surveillance program. It is not intended to diagnose MRSA infection nor to guide or monitor treatment  for MRSA infections. Performed at [facility], [address] [place], [place] [address]          Radiology Studies: CT CHEST WO CONTRAST  Result Date: 01/24/2021 CLINICAL DATA:  87 year old with cough and dyspnea. Acute myeloid leukemia. EXAM: CT CHEST WITHOUT CONTRAST TECHNIQUE: Multidetector CT imaging of the chest was performed following the standard protocol without IV contrast. COMPARISON:  CT abdomen 09/16/2017 FINDINGS: Cardiovascular: Normal caliber of the thoracic aorta with diffuse calcifications. Coronary arteries are heavily calcified. Minimal pericardial fluid. Heart size is within normal limits. Mediastinum/Nodes: No significant mediastinal lymphadenopathy. Largest lymph node measures 1.0 cm in the short axis in the precarinal region on sequence 2 image 68. Limited evaluation for hilar lymph node enlargement without intravascular contrast. Moderate to large sized hiatal hernia with contrast in the lumen. Small right supraclavicular irregular lymph nodes are nonspecific. Small axillary lymph nodes bilaterally. Lungs/Pleura: Trachea is patent. Large amount of airspace disease in the right lower lobe. Patchy airspace densities in the left upper lobe and left lower lobe. There is minimal disease in the right middle lobe and right upper lobe. Findings are most compatible with pneumonia. No significant pleural fluid. Upper Abdomen: Splenomegaly. Musculoskeletal: No acute bone abnormality. IMPRESSION: 1. Bilateral multifocal pneumonia. The disease is most severe in the right lower lobe. 2. Splenomegaly. This is likely associated with patient's acute myeloid leukemia. 3. Moderate to large sized hiatal hernia. 4.  Aortic Atherosclerosis (ICD10-I70.0). Electronically Signed   By: Markus Daft M.D.   On: 01/24/2021 16:07   DG Chest Port 1 View  Result Date: 01/24/2021 CLINICAL DATA:  Sepsis. EXAM: PORTABLE CHEST 1 VIEW COMPARISON:  November 08, 2006. FINDINGS: The heart size and  mediastinal contours are within normal limits. Mild bibasilar subsegmental atelectasis is noted. The visualized skeletal structures are unremarkable. IMPRESSION: Mild bibasilar subsegmental atelectasis. Aortic Atherosclerosis (ICD10-I70.0). Electronically Signed   By: Marijo Conception M.D.   On: 01/24/2021 12:24   ECHOCARDIOGRAM COMPLETE  Result Date: 01/25/2021    ECHOCARDIOGRAM REPORT   Patient Name:   Paul Pham.  Date of Exam: 01/25/2021 Medical Rec #:  [id number]        Height:       73.0 in Accession #:    [id number]       Weight:       166.9 lb Date of Birth:  05/13/1926        BSA:          1.992 m Patient Age:    8 years         BP:           104/40 mmHg Patient Gender: M                HR:           97 bpm. Exam Location:  [facility] Procedure: 2D Echo Indications:     CHF I50.21  History:         Patient has no prior history of Echocardiogram examinations.  Sonographer:     Kathlen Brunswick RDCS Referring Phys:  [id number] Ottie Glazier Diagnosing Phys: Ida Rogue MD  Sonographer Comments: Technically difficult study due to poor echo windows. Image acquisition challenging due to uncooperative patient. IMPRESSIONS  1. Left ventricular ejection fraction, by estimation, is 55 to 60%. The left ventricle has normal function. The left ventricle has no regional wall motion abnormalities. Left ventricular diastolic parameters are consistent with Grade II diastolic dysfunction (pseudonormalization).  2. Right ventricular systolic function is normal. The right ventricular size is normal. There is moderately elevated pulmonary artery systolic pressure. The estimated right ventricular systolic pressure is 32.5 mmHg.  3. Left atrial size was mildly dilated.  4. Tricuspid valve regurgitation is mild to moderate.  5. The inferior vena cava is dilated in size with <50% respiratory variability, suggesting right atrial pressure of 15 mmHg. FINDINGS  Left Ventricle: Left ventricular ejection fraction, by estimation, is 55  to 60%. The left ventricle has normal function. The left ventricle has no regional wall motion abnormalities. The left ventricular internal cavity size was normal in size. There is  no left ventricular hypertrophy. Left ventricular diastolic parameters are consistent with Grade II diastolic dysfunction (pseudonormalization). Right Ventricle: The right ventricular size is normal. No increase in right ventricular wall thickness. Right ventricular systolic function is normal. There is moderately elevated pulmonary artery systolic pressure. The tricuspid regurgitant velocity is 2.97 m/s, and with an assumed right atrial pressure of 15 mmHg, the estimated right ventricular systolic pressure is 49.8 mmHg. Left Atrium: Left atrial size was mildly dilated. Right Atrium: Right atrial size was normal in size. Pericardium: There is no evidence of pericardial effusion. Mitral Valve: The mitral valve is normal in structure. No evidence of mitral valve regurgitation. No evidence of mitral valve stenosis. Tricuspid Valve: The tricuspid valve is normal in structure. Tricuspid valve regurgitation is mild to moderate. No evidence of tricuspid stenosis. Aortic Valve: The aortic valve is normal in structure. Aortic valve regurgitation is not visualized. Aortic regurgitation PHT measures 520 msec. No aortic stenosis is present. Aortic valve peak gradient measures 8.8 mmHg. Pulmonic Valve: The pulmonic valve was normal in structure. Pulmonic valve regurgitation is not visualized. No evidence of pulmonic stenosis. Aorta: The aortic root is normal in size and structure. Venous: The inferior vena cava is dilated in size with less than 50% respiratory variability, suggesting right atrial pressure of 15 mmHg. IAS/Shunts: No atrial level shunt detected by color flow Doppler.  LEFT VENTRICLE PLAX 2D LVIDd:         3.98 cm  Diastology LVIDs:         2.72 cm LV e' medial:    7.18 cm/s LV PW:         1.31 cm LV E/e' medial:  11.3 LV IVS:         1.34 cm LV e' lateral:   9.46 cm/s                        LV E/e' lateral: 8.6  RIGHT VENTRICLE RV Basal diam:  3.14 cm RV S prime:     18.30 cm/s TAPSE (M-mode): 2.2 cm LEFT ATRIUM         Index LA diam:    5.10 cm 2.56 cm/m  AORTIC VALVE              PULMONIC VALVE AV Vmax:      148.00 cm/s PV Vmax:       1.02 m/s AV Peak Grad: 8.8 mmHg    PV Peak grad:  4.2 mmHg LVOT Vmax:    91.40 cm/s LVOT Vmean:   57.400 cm/s LVOT VTI:     0.204 m AI PHT:       520 msec MITRAL VALVE               TRICUSPID VALVE MV Area (PHT): 5.16 cm    TV Peak grad:   32.1 mmHg MV Decel Time: 147 msec    TV Vmax:        2.83 m/s MV E velocity: 81.40 cm/s  TR Peak grad:   35.3 mmHg MV A velocity: 73.30 cm/s  TR Vmax:        297.00 cm/s MV E/A ratio:  1.11                            SHUNTS                            Systemic VTI: 0.20 m Ida Rogue MD Electronically signed by Ida Rogue MD Signature Date/Time: 01/25/2021/12:51:54 PM    Final         Scheduled Meds: . sodium chloride   Intravenous Once  . acyclovir  400 mg Oral BID  . allopurinol  300 mg Oral Daily  . calcium carbonate  1 tablet Oral BID WC  . finasteride  5 mg Oral Daily  . insulin aspart  0-15 Units Subcutaneous Q4H  . lidocaine-prilocaine   Topical Once  . omega-3 acid ethyl esters  1 g Oral Daily  . potassium chloride  20 mEq Oral BID  . simvastatin  40 mg Oral Daily  . vitamin B-12  1,000 mcg Oral Daily   Continuous Infusions: . azithromycin Stopped (01/25/21 1650)  . cefTRIAXone (ROCEPHIN)  IV Stopped (01/25/21 0215)     LOS: 1 day    Time spent: 44mins    Kathie Dike, MD [facility]   If 7PM-7AM, please contact night-coverage [email or web address]  01/25/2021, 6:24 PM

## 2021-01-25 NOTE — Assessment & Plan Note (Addendum)
#  87 year old male patient with history of acute myeloid leukemia is currently to the hospital for worsening shortness of breath cough/imaging suggestive of multifocal pneumonia  # Acute Myeloid leukemia- elevated WBC [59k-WBC; hb-6.8; platelets-70]-currently on Dacogen cycle #1.  See discussion below  # Multifocal pneumonia/sepsis-currently on broad-spectrum antibiotics; O2 nasal cannula.  #Severe anemia hemoglobin-hemoglobin 6-7; moderate thrombocytopenia- platelets 60s from acute myeloid leukemia.  S/p unit of PRBC transfusion  #Acute renal failure creatinine around 2; baseline around 1.2.  Secondary to sepsis  #Agitation/delirium secondary to infection.   #DNR/DNI  #Recommendations:  #I had a long discussion with patient's family [wife and 2 sons]-regarding patient's critical situation.  Patient has extremely poor prognosis given his age/multifocal pneumonia in the context of acute myeloid leukemia.  Agree with DNR/DNI.  Would recommend continuing current scope of care at this time.  Need to be reevaluated in the next 24 hours-if care could be de-escalated if patient's clinical situation continues to deteriorate.  If patient's clinical situation deteriorates-hospice/comfort measures would be recommended.  Thank you Dr. Roderic Palau for allowing me to participate in the care of your pleasant patient. Please do not hesitate to contact me with questions or concerns in the interim.  # I reviewed the blood work- with the patient in detail; also reviewed the imaging independently [as summarized above]; and with the patient in detail.

## 2021-01-25 NOTE — Progress Notes (Signed)
*  PRELIMINARY RESULTS* Echocardiogram 2D Echocardiogram has been performed.  Paul Pham 01/25/2021, 8:52 AM

## 2021-01-25 NOTE — Consult Note (Signed)
[employer] NOTE  Patient Care Team: Lequita Asal, MD as PCP - General (Hematology and Oncology) Lequita Asal, MD as PCP - Hematology/Oncology (Oncology)  CHIEF COMPLAINTS/PURPOSE OF CONSULTATION: Acute myeloid leukemia/pneumonia  HISTORY OF PRESENTING ILLNESS:  Paul Pham. 87 y.o.  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  Patient was evaluated in the clinic yesterday-noted to have worsening shortness of breath or cough.  Also noted to have worsening nausea with vomiting.  Patient was sent over from the clinic to the ER by EMS.  CT scan showed-bilateral multifocal pneumonia.  Patient is currently on O2.  Started on broad-spectrum antibiotics.  "Patient noted to have elevated white count-1 6 7000 hemoglobin 7-8 platelets 70s.    Review of Systems  Unable to perform ROS: Age     MEDICAL HISTORY:  Past Medical History:  Diagnosis Date  . Anemia   . Arthritis   . BPH (benign prostatic hypertrophy)   . Cancer ([place])   . Complication of anesthesia    nausea  . Diabetes type 2, controlled ([place])   . HOH (hard of hearing)    r and L ears-70% loss per pt  . Hyperlipidemia   . Hypertension   . Myelodysplasia (myelodysplastic syndrome) ([place]) 2019    SURGICAL HISTORY: Past Surgical History:  Procedure Laterality Date  . APPENDECTOMY    . COLONOSCOPY  09/21/08   1 polyp found, tubular adenoma  . HAND SURGERY    . KNEE SURGERY Left     SOCIAL HISTORY: Social History   Socioeconomic History  . Marital status: Married    Spouse name: Not on file  . Number of children: Not on file  . Years of education: Not on file  . Highest education level: Not on file  Occupational History  . Not on file  Tobacco Use  . Smoking status: Never Smoker  . Smokeless tobacco: Never Used  Vaping Use  . Vaping Use: Never used  Substance and Sexual Activity  . Alcohol use: No     Alcohol/week: 0.0 standard drinks  . Drug use: No  . Sexual activity: Not on file  Other Topics Concern  . Not on file  Social History Narrative   Lives in private residence with wife   Social Determinants of Health   Financial Resource Strain: Not on file  Food Insecurity: Not on file  Transportation Needs: Not on file  Physical Activity: Not on file  Stress: Not on file  Social Connections: Not on file  Intimate Partner Violence: Not on file    FAMILY HISTORY: Family History  Problem Relation Age of Onset  . Aneurysm Mother   . Heart disease Father   . Cancer Brother        skin  . Heart disease Brother   . Heart disease Brother   . Cancer Sister        skin  . Aneurysm Brother   . Heart disease Brother   . Heart disease Sister   . Heart disease Sister   . COPD Sister   . Diabetes Sister     ALLERGIES:  has No Known Allergies.  MEDICATIONS:  Current Facility-Administered Medications  Medication Dose Route Frequency Provider Last Rate Last Admin  . 0.9 %  sodium chloride infusion (Manually program via Guardrails IV Fluids)   Intravenous Once Ottie Glazier, MD   Held at 01/24/21 1658  . acetaminophen (TYLENOL) tablet 650  mg  650 mg Oral Q6H PRN Agbata, Tochukwu, MD       Or  . acetaminophen (TYLENOL) suppository 650 mg  650 mg Rectal Q6H PRN Agbata, Tochukwu, MD      . acyclovir (ZOVIRAX) 200 MG capsule 400 mg  400 mg Oral BID Dorothe Pea, RPH   400 mg at 01/25/21 1457  . allopurinol (ZYLOPRIM) tablet 300 mg  300 mg Oral Daily Dorothe Pea, RPH   300 mg at 01/25/21 1456  . azithromycin (ZITHROMAX) 500 mg in sodium chloride 0.9 % 250 mL IVPB  500 mg Intravenous Q24H Agbata, Tochukwu, MD   Stopped at 01/25/21 1650  . calcium carbonate (OS-CAL - dosed in mg of elemental calcium) tablet 500 mg of elemental calcium  1 tablet Oral BID WC Virl Cagey E, RPH   500 mg of elemental calcium at 01/25/21 1458  . cefTRIAXone (ROCEPHIN) 2 g in sodium chloride 0.9 %  100 mL IVPB  2 g Intravenous Q24H Collier Bullock, MD   Stopped at 01/25/21 0215  . finasteride (PROSCAR) tablet 5 mg  5 mg Oral Daily Dorothe Pea, RPH   5 mg at 01/25/21 1457  . haloperidol lactate (HALDOL) injection 5 mg  5 mg Intravenous Q6H PRN Kathie Dike, MD      . insulin aspart (novoLOG) injection 0-15 Units  0-15 Units Subcutaneous Q4H Agbata, Tochukwu, MD   5 Units at 01/25/21 2151  . lidocaine-prilocaine (EMLA) cream   Topical Once Agbata, Tochukwu, MD      . meclizine (ANTIVERT) tablet 12.5 mg  12.5 mg Oral BID PRN Agbata, Tochukwu, MD      . omega-3 acid ethyl esters (LOVAZA) capsule 1 g  1 g Oral Daily Agbata, Tochukwu, MD   1 g at 01/25/21 1456  . ondansetron (ZOFRAN) tablet 4 mg  4 mg Oral Q6H PRN Agbata, Tochukwu, MD       Or  . ondansetron (ZOFRAN) injection 4 mg  4 mg Intravenous Q6H PRN Agbata, Tochukwu, MD   4 mg at 01/24/21 1748  . polyethylene glycol (MIRALAX / GLYCOLAX) packet 17 g  17 g Oral Daily PRN Agbata, Tochukwu, MD      . potassium chloride SA (KLOR-CON) CR tablet 20 mEq  20 mEq Oral BID Kathie Dike, MD   20 mEq at 01/25/21 1458  . simvastatin (ZOCOR) tablet 40 mg  40 mg Oral Daily Agbata, Tochukwu, MD   40 mg at 01/25/21 1455  . vitamin B-12 (CYANOCOBALAMIN) tablet 1,000 mcg  1,000 mcg Oral Daily Dorothe Pea, RPH   1,000 mcg at 01/25/21 1457   Facility-Administered Medications Ordered in Other Encounters  Medication Dose Route Frequency Provider Last Rate Last Admin  . acetaminophen (TYLENOL) tablet 650 mg  650 mg Oral Once Lequita Asal, MD      . acetaminophen (TYLENOL) tablet 650 mg  650 mg Oral Once Lequita Asal, MD      . acetaminophen (TYLENOL) tablet 650 mg  650 mg Oral Once Lequita Asal, MD      . acetaminophen (TYLENOL) tablet 650 mg  650 mg Oral Once Lequita Asal, MD      . acetaminophen (TYLENOL) tablet 650 mg  650 mg Oral Once Nolon Stalls C, MD      . diphenhydrAMINE (BENADRYL) capsule 25 mg  25 mg  Oral Once Nolon Stalls C, MD      . diphenhydrAMINE (BENADRYL) capsule 25 mg  25 mg Oral Once [facility] [place],  Melissa C, MD      . diphenhydrAMINE (BENADRYL) capsule 25 mg  25 mg Oral Once [place], Melissa C, MD      . diphenhydrAMINE (BENADRYL) capsule 25 mg  25 mg Oral Once [place]an, Melissa C, MD      . diphenhydrAMINE (BENADRYL) capsule 25 mg  25 mg Oral Once Lequita Asal, MD          .  PHYSICAL EXAMINATION:  Vitals:   01/25/21 1700 01/25/21 1930  BP: (!) 104/44 121/61  Pulse: (!) 101 90  Resp: 19 20  Temp:    SpO2: 96% 97%   Filed Weights   01/24/21 1126  Weight: 75.7 kg    Physical Exam Constitutional:      Comments: Elderly male patient resting in the bed.  No acute distress.  Accompanied by family.  HENT:     Head: Normocephalic and atraumatic.     Mouth/Throat:     Pharynx: No oropharyngeal exudate.  Eyes:     Pupils: Pupils are equal, round, and reactive to light.  Cardiovascular:     Rate and Rhythm: Regular rhythm.     Comments: Tachycardic. Pulmonary:     Effort: No respiratory distress.     Breath sounds: No wheezing.     Comments: Decreased breath sounds bilaterally. Abdominal:     General: Bowel sounds are normal. There is no distension.     Palpations: Abdomen is soft. There is no mass.     Tenderness: There is no abdominal tenderness. There is no guarding or rebound.  Musculoskeletal:        General: No tenderness. Normal range of motion.     Cervical back: Normal range of motion and neck supple.  Skin:    General: Skin is warm.  Neurological:     Mental Status: He is alert.     Comments: Drowsy arousable  Psychiatric:        Mood and Affect: Affect normal.     LABORATORY DATA:  I have reviewed the data as listed Lab Results  Component Value Date   WBC 167.7 (HH) 01/25/2021   HGB 8.0 (L) 01/25/2021   HCT 23.7 (L) 01/25/2021   MCV 84.4 01/25/2021   PLT 77 (L) 01/25/2021   Recent Labs    03/20/20 0920 04/17/20 0922  05/22/20 0905 06/19/20 0951 01/20/21 0844 01/22/21 0906 01/24/21 1157 01/24/21 1644 01/25/21 0625  NA 136 135 133*   < > 136 139 135  --  135  K 4.4 4.3 4.1   < > 3.3* 3.1* 2.2* 2.9* 4.3  CL 101 103 100   < > 99 104 99  --  102  CO2 25 24 24    < > 24 28 22   --  23  GLUCOSE 211* 194* 199*   < > 228* 220* 248*  --  215*  BUN 23 27* 26*   < > 16 19 19   --  30*  CREATININE 1.07 1.07 0.98   < > 1.29* 1.31* 1.59*  --  2.01*  CALCIUM 8.7* 8.5* 8.7*   < > 9.0 9.0 8.8*  --  8.1*  GFRNONAA >60 60* >60   < > 52* 51* 40*  --  31*  GFRAA >60 >60 >60  --   --   --   --   --   --   PROT 7.1 7.0 7.0   < > 7.1 7.0 6.9  --   --   ALBUMIN 3.9 3.9  4.1   < > 3.9 3.8 3.6  --   --   AST 22 23 25    < > 47* 48* 50*  --   --   ALT 21 21 23    < > 40 41 40  --   --   ALKPHOS 30* 30* 31*   < > 57 51 53  --   --   BILITOT 0.9 0.5 0.9   < > 0.9 0.7 0.8  --   --    < > = values in this interval not displayed.    RADIOGRAPHIC STUDIES: I have personally reviewed the radiological images as listed and agreed with the findings in the report. CT CHEST WO CONTRAST  Result Date: 01/24/2021 CLINICAL DATA:  87 year old with cough and dyspnea. Acute myeloid leukemia. EXAM: CT CHEST WITHOUT CONTRAST TECHNIQUE: Multidetector CT imaging of the chest was performed following the standard protocol without IV contrast. COMPARISON:  CT abdomen 09/16/2017 FINDINGS: Cardiovascular: Normal caliber of the thoracic aorta with diffuse calcifications. Coronary arteries are heavily calcified. Minimal pericardial fluid. Heart size is within normal limits. Mediastinum/Nodes: No significant mediastinal lymphadenopathy. Largest lymph node measures 1.0 cm in the short axis in the precarinal region on sequence 2 image 68. Limited evaluation for hilar lymph node enlargement without intravascular contrast. Moderate to large sized hiatal hernia with contrast in the lumen. Small right supraclavicular irregular lymph nodes are nonspecific. Small axillary  lymph nodes bilaterally. Lungs/Pleura: Trachea is patent. Large amount of airspace disease in the right lower lobe. Patchy airspace densities in the left upper lobe and left lower lobe. There is minimal disease in the right middle lobe and right upper lobe. Findings are most compatible with pneumonia. No significant pleural fluid. Upper Abdomen: Splenomegaly. Musculoskeletal: No acute bone abnormality. IMPRESSION: 1. Bilateral multifocal pneumonia. The disease is most severe in the right lower lobe. 2. Splenomegaly. This is likely associated with patient's acute myeloid leukemia. 3. Moderate to large sized hiatal hernia. 4.  Aortic Atherosclerosis (ICD10-I70.0). Electronically Signed   By: Markus Daft M.D.   On: 01/24/2021 16:07   US RENAL  Result Date: 01/25/2021 CLINICAL DATA:  Acute renal insufficiency. EXAM: RENAL / URINARY TRACT ULTRASOUND COMPLETE COMPARISON:  Body CT September 16, 2017 FINDINGS: Right Kidney: Renal measurements: 10.4 x 5.3 x 5.6 cm = volume: 160 mL. Mildly increased echogenicity. No hydronephrosis visualized. Benign-appearing 7 mm cyst is seen exophytic off of the midpole region of the right kidney. Left Kidney: Renal measurements: 12.7 x 7.5 x 5.2 cm = volume: 259 mL. Mildly increased echogenicity. No mass or hydronephrosis visualized. Bladder: Appears normal for degree of bladder distention. Other: None. IMPRESSION: Mildly increased cortical echogenicity of bilateral kidneys, consistent with medical renal disease. Electronically Signed   By: Fidela Salisbury M.D.   On: 01/25/2021 19:39   DG Chest Port 1 View  Result Date: 01/24/2021 CLINICAL DATA:  Sepsis. EXAM: PORTABLE CHEST 1 VIEW COMPARISON:  November 08, 2006. FINDINGS: The heart size and mediastinal contours are within normal limits. Mild bibasilar subsegmental atelectasis is noted. The visualized skeletal structures are unremarkable. IMPRESSION: Mild bibasilar subsegmental atelectasis. Aortic Atherosclerosis (ICD10-I70.0).  Electronically Signed   By: Marijo Conception M.D.   On: 01/24/2021 12:24   ECHOCARDIOGRAM COMPLETE  Result Date: 01/25/2021    ECHOCARDIOGRAM REPORT   Patient Name:   Paul Pham. Date of Exam: 01/25/2021 Medical Rec #:  [id number]        Height:       73.0 in  Accession #:    [id number]       Weight:       166.9 lb Date of Birth:  1925/12/15        BSA:          1.992 m Patient Age:    89 years         BP:           104/40 mmHg Patient Gender: M                HR:           97 bpm. Exam Location:  [facility] Procedure: 2D Echo Indications:     CHF I50.21  History:         Patient has no prior history of Echocardiogram examinations.  Sonographer:     Kathlen Brunswick RDCS Referring Phys:  [id number] Ottie Glazier Diagnosing Phys: Ida Rogue MD  Sonographer Comments: Technically difficult study due to poor echo windows. Image acquisition challenging due to uncooperative patient. IMPRESSIONS  1. Left ventricular ejection fraction, by estimation, is 55 to 60%. The left ventricle has normal function. The left ventricle has no regional wall motion abnormalities. Left ventricular diastolic parameters are consistent with Grade II diastolic dysfunction (pseudonormalization).  2. Right ventricular systolic function is normal. The right ventricular size is normal. There is moderately elevated pulmonary artery systolic pressure. The estimated right ventricular systolic pressure is 25.8 mmHg.  3. Left atrial size was mildly dilated.  4. Tricuspid valve regurgitation is mild to moderate.  5. The inferior vena cava is dilated in size with <50% respiratory variability, suggesting right atrial pressure of 15 mmHg. FINDINGS  Left Ventricle: Left ventricular ejection fraction, by estimation, is 55 to 60%. The left ventricle has normal function. The left ventricle has no regional wall motion abnormalities. The left ventricular internal cavity size was normal in size. There is  no left ventricular hypertrophy. Left ventricular  diastolic parameters are consistent with Grade II diastolic dysfunction (pseudonormalization). Right Ventricle: The right ventricular size is normal. No increase in right ventricular wall thickness. Right ventricular systolic function is normal. There is moderately elevated pulmonary artery systolic pressure. The tricuspid regurgitant velocity is 2.97 m/s, and with an assumed right atrial pressure of 15 mmHg, the estimated right ventricular systolic pressure is 52.7 mmHg. Left Atrium: Left atrial size was mildly dilated. Right Atrium: Right atrial size was normal in size. Pericardium: There is no evidence of pericardial effusion. Mitral Valve: The mitral valve is normal in structure. No evidence of mitral valve regurgitation. No evidence of mitral valve stenosis. Tricuspid Valve: The tricuspid valve is normal in structure. Tricuspid valve regurgitation is mild to moderate. No evidence of tricuspid stenosis. Aortic Valve: The aortic valve is normal in structure. Aortic valve regurgitation is not visualized. Aortic regurgitation PHT measures 520 msec. No aortic stenosis is present. Aortic valve peak gradient measures 8.8 mmHg. Pulmonic Valve: The pulmonic valve was normal in structure. Pulmonic valve regurgitation is not visualized. No evidence of pulmonic stenosis. Aorta: The aortic root is normal in size and structure. Venous: The inferior vena cava is dilated in size with less than 50% respiratory variability, suggesting right atrial pressure of 15 mmHg. IAS/Shunts: No atrial level shunt detected by color flow Doppler.  LEFT VENTRICLE PLAX 2D LVIDd:         3.98 cm Diastology LVIDs:         2.72 cm LV e' medial:    7.18 cm/s LV PW:  1.31 cm LV E/e' medial:  11.3 LV IVS:        1.34 cm LV e' lateral:   9.46 cm/s                        LV E/e' lateral: 8.6  RIGHT VENTRICLE RV Basal diam:  3.14 cm RV S prime:     18.30 cm/s TAPSE (M-mode): 2.2 cm LEFT ATRIUM         Index LA diam:    5.10 cm 2.56 cm/m  AORTIC  VALVE              PULMONIC VALVE AV Vmax:      148.00 cm/s PV Vmax:       1.02 m/s AV Peak Grad: 8.8 mmHg    PV Peak grad:  4.2 mmHg LVOT Vmax:    91.40 cm/s LVOT Vmean:   57.400 cm/s LVOT VTI:     0.204 m AI PHT:       520 msec MITRAL VALVE               TRICUSPID VALVE MV Area (PHT): 5.16 cm    TV Peak grad:   32.1 mmHg MV Decel Time: 147 msec    TV Vmax:        2.83 m/s MV E velocity: 81.40 cm/s  TR Peak grad:   35.3 mmHg MV A velocity: 73.30 cm/s  TR Vmax:        297.00 cm/s MV E/A ratio:  1.11                            SHUNTS                            Systemic VTI: 0.20 m Ida Rogue MD Electronically signed by Ida Rogue MD Signature Date/Time: 01/25/2021/12:51:54 PM    Final     Acute myeloid leukemia not having achieved remission [facility]) #87 year old male patient with history of acute myeloid leukemia is currently to the hospital for worsening shortness of breath cough/imaging suggestive of multifocal pneumonia  # Acute Myeloid leukemia- elevated WBC [59k-WBC; hb-6.8; platelets-70]-currently on Dacogen cycle #1.  See discussion below  # Multifocal pneumonia/sepsis-currently on broad-spectrum antibiotics; O2 nasal cannula.  #Severe anemia hemoglobin-hemoglobin 6-7; moderate thrombocytopenia- platelets 60s from acute myeloid leukemia.  S/p unit of PRBC transfusion  #Acute renal failure creatinine around 2; baseline around 1.2.  Secondary to sepsis  #Agitation/delirium secondary to infection.   #DNR/DNI  #Recommendations:  #I had a long discussion with patient's family [wife and 2 sons]-regarding patient's critical situation.  Patient has extremely poor prognosis given his age/multifocal pneumonia in the context of acute myeloid leukemia.  Agree with DNR/DNI.  Would recommend continuing current scope of care at this time.  Need to be reevaluated in the next 24 hours-if care could be de-escalated if patient's clinical situation continues to deteriorate.  If patient's clinical  situation deteriorates-hospice/comfort measures would be recommended.  Thank you Dr. Roderic Palau for allowing me to participate in the care of your pleasant patient. Please do not hesitate to contact me with questions or concerns in the interim.  # I reviewed the blood work- with the patient in detail; also reviewed the imaging independently [as summarized above]; and with the patient in detail.   All questions were answered. The patient knows to call the clinic with any problems,  questions or concerns.    Cammie Sickle, MD 01/25/2021 11:00 PM .Delene Ruffini

## 2021-01-25 NOTE — Progress Notes (Signed)
Pharmacy Electrolyte Monitoring Consult:  Pharmacy consulted to assist in monitoring and replacing electrolytes in this 87 y.o. male admitted on 01/24/2021 with Fever   Labs:  Sodium (mmol/L)  Date Value  01/25/2021 135   Potassium (mmol/L)  Date Value  01/25/2021 4.3   Magnesium (mg/dL)  Date Value  01/25/2021 2.0   Phosphorus (mg/dL)  Date Value  01/25/2021 4.1   Calcium (mg/dL)  Date Value  01/25/2021 8.1 (L)   Albumin (g/dL)  Date Value  01/24/2021 3.6    Assessment/Plan: 05/28  K 4.3  Mag 2.0  Phos 4.1  Scr 2.01 -currently ordered KCL 40 meq PO bid -will decrease KCL to 20 meq PO bid -f/u electrolytes with am labs   Zaydah Nawabi A 01/25/2021 8:49 AM

## 2021-01-25 NOTE — ED Notes (Signed)
Pt now alert and agreeable to take oral medications.

## 2021-01-25 NOTE — ED Notes (Addendum)
Pt too confused and agitated to take oral medications. Family at bedside who wishes not to attempt giving any medications by mouth at this time. Will attempt to administer medications at a later time.

## 2021-01-26 DIAGNOSIS — A419 Sepsis, unspecified organism: Secondary | ICD-10-CM | POA: Diagnosis not present

## 2021-01-26 DIAGNOSIS — C9200 Acute myeloblastic leukemia, not having achieved remission: Secondary | ICD-10-CM | POA: Diagnosis not present

## 2021-01-26 DIAGNOSIS — J9600 Acute respiratory failure, unspecified whether with hypoxia or hypercapnia: Secondary | ICD-10-CM | POA: Diagnosis not present

## 2021-01-26 DIAGNOSIS — N179 Acute kidney failure, unspecified: Secondary | ICD-10-CM | POA: Diagnosis not present

## 2021-01-26 LAB — URINE CULTURE: Culture: <10000 — AB

## 2021-01-26 LAB — GLUCOSE, CAPILLARY
Glucose-Capillary: 165 mg/dL — ABNORMAL HIGH (ref 70–99)
Glucose-Capillary: 188 mg/dL — ABNORMAL HIGH (ref 70–99)
Glucose-Capillary: 189 mg/dL — ABNORMAL HIGH (ref 70–99)
Glucose-Capillary: 205 mg/dL — ABNORMAL HIGH (ref 70–99)
Glucose-Capillary: 223 mg/dL — ABNORMAL HIGH (ref 70–99)
Glucose-Capillary: 239 mg/dL — ABNORMAL HIGH (ref 70–99)
Glucose-Capillary: 249 mg/dL — ABNORMAL HIGH (ref 70–99)
Glucose-Capillary: 290 mg/dL — ABNORMAL HIGH (ref 70–99)

## 2021-01-26 LAB — BPAM RBC
Blood Product Expiration Date: 202205302359
Blood Product Expiration Date: 202206062359
ISSUE DATE / TIME: 202205271851
ISSUE DATE / TIME: 202205280415
Unit Type and Rh: 5100
Unit Type and Rh: 9500

## 2021-01-26 LAB — BASIC METABOLIC PANEL
Anion gap: 11 (ref 5–15)
BUN: 40 mg/dL — ABNORMAL HIGH (ref 8–23)
CO2: 23 mmol/L (ref 22–32)
Calcium: 8.2 mg/dL — ABNORMAL LOW (ref 8.9–10.3)
Chloride: 104 mmol/L (ref 98–111)
Creatinine, Ser: 1.94 mg/dL — ABNORMAL HIGH (ref 0.61–1.24)
GFR, Estimated: 32 mL/min — ABNORMAL LOW (ref >60)
Glucose, Bld: 180 mg/dL — ABNORMAL HIGH (ref 70–99)
Potassium: 4 mmol/L (ref 3.5–5.1)
Sodium: 138 mmol/L (ref 135–145)

## 2021-01-26 LAB — TYPE AND SCREEN
ABO/RH(D): B POS
Antibody Screen: NEGATIVE
Unit division: 0
Unit division: 0

## 2021-01-26 LAB — CBC
HCT: 23.8 % — ABNORMAL LOW (ref 39.0–52.0)
Hemoglobin: 7.8 g/dL — ABNORMAL LOW (ref 13.0–17.0)
MCH: 27.9 pg (ref 26.0–34.0)
MCHC: 32.8 g/dL (ref 30.0–36.0)
MCV: 85 fL (ref 80.0–100.0)
Platelets: 65 10*3/uL — ABNORMAL LOW (ref 150–400)
RBC: 2.8 MIL/uL — ABNORMAL LOW (ref 4.22–5.81)
RDW: 18.6 % — ABNORMAL HIGH (ref 11.5–15.5)
WBC: 125.1 10*3/uL (ref 4.0–10.5)
nRBC: 0.4 % — ABNORMAL HIGH (ref 0.0–0.2)

## 2021-01-26 LAB — HAPTOGLOBIN: Haptoglobin: 133 mg/dL (ref 38–329)

## 2021-01-26 LAB — CORTISOL-AM, BLOOD: Cortisol - AM: 14.3 ug/dL (ref 6.7–22.6)

## 2021-01-26 LAB — PHOSPHORUS: Phosphorus: 6 mg/dL — ABNORMAL HIGH (ref 2.5–4.6)

## 2021-01-26 LAB — MAGNESIUM: Magnesium: 2.3 mg/dL (ref 1.7–2.4)

## 2021-01-26 MED ORDER — LACTATED RINGERS IV SOLN: INTRAVENOUS | Status: AC

## 2021-01-26 MED ORDER — GUAIFENESIN-DM 100-10 MG/5ML PO SYRP
5.0000 mL | ORAL_SOLUTION | ORAL | Status: DC | PRN
  Administered 2021-01-26: 04:00:00 5 mL via ORAL
  Filled 2021-01-26: qty 5

## 2021-01-26 NOTE — TOC Initial Note (Signed)
Transition of Care (TOC) - Initial/Assessment Note    Patient Details  Name: Paul Pham. MRN: [id number] Date of Birth: 1926/08/30  Transition of Care [facility]) CM/SW Contact:    Candie Chroman, LCSW Phone Number: 01/26/2021, 12:16 PM  Clinical Narrative:  Readmission prevention screen complete. CSW met with patient. Eldest son at bedside. CSW introduced role and explained that discharge planning would be discussed. PCP not listed in chart. Patient said he sees Dr. Ginette Pitman for primary care. Son confirmed wife drives him to appointments. Pharmacy is [facility] in [place]. No issues obtaining medications. No home health prior to admission. Over the past several weeks patient has started using a single-point cane to get around at home. PT/OT evals pending to determine additional needs. He is not on oxygen at home. Currently on 4 L acute. Will follow for this need as well. No further concerns. CSW encouraged patient and his son to contact CSW as needed. CSW will continue to follow patient and his family for support and facilitate return home when stable.               Expected Discharge Plan: [place] Barriers to Discharge: Continued Medical Work up   Patient Goals and CMS Choice        Expected Discharge Plan and Services Expected Discharge Plan: [place]       Living arrangements for the past 2 months: Single Family Home                                      Prior Living Arrangements/Services Living arrangements for the past 2 months: Single Family Home Lives with:: Spouse Patient language and need for interpreter reviewed:: Yes Do you feel safe going back to the place where you live?: Yes      Need for Family Participation in Patient Care: Yes (Comment) Care giver support system in place?: Yes (comment) Current home services: DME Criminal Activity/Legal Involvement Pertinent to Current Situation/Hospitalization: No - Comment as  needed  Activities of Daily Living Home Assistive Devices/Equipment: Bathtub lift ADL Screening (condition at time of admission) Patient's cognitive ability adequate to safely complete daily activities?: No Is the patient deaf or have difficulty hearing?: Yes Does the patient have difficulty seeing, even when wearing glasses/contacts?: Yes Does the patient have difficulty concentrating, remembering, or making decisions?: Yes Patient able to express need for assistance with ADLs?: Yes Does the patient have difficulty dressing or bathing?: Yes Independently performs ADLs?: No Communication: Needs assistance Is this a change from baseline?: Change from baseline, expected to last >3 days Dressing (OT): Dependent Is this a change from baseline?: Change from baseline, expected to last >3 days Grooming: Needs assistance Is this a change from baseline?: Change from baseline, expected to last >3 days Feeding: Needs assistance Is this a change from baseline?: Change from baseline, expected to last >3 days Bathing: Needs assistance Is this a change from baseline?: Change from baseline, expected to last >3 days Toileting: Needs assistance Is this a change from baseline?: Change from baseline, expected to last >3days In/Out Bed: Needs assistance Is this a change from baseline?: Change from baseline, expected to last >3 days Walks in Home: Needs assistance Is this a change from baseline?: Change from baseline, expected to last >3 days Does the patient have difficulty walking or climbing stairs?: Yes Weakness of Legs: Both Weakness of  Arms/Hands: Both  Permission Sought/Granted Permission sought to share information with : Family Supports          Permission granted to share info w Relationship: Son at bedside     Emotional Assessment Appearance:: Appears stated age Attitude/Demeanor/Rapport: Engaged,Gracious Affect (typically observed): Accepting,Appropriate,Calm,Pleasant Orientation: :  Oriented to Self,Oriented to Place,Oriented to  Time,Oriented to Situation Alcohol / Substance Use: Not Applicable Psych Involvement: No (comment)  Admission diagnosis:  AKI (acute kidney injury) (HCC) [N17.9] Sepsis (HCC) [A41.9] Patient Active Problem List   Diagnosis Date Noted  . CKD (chronic kidney disease) stage 3, GFR 30-59 ml/min (HCC) 01/25/2021  . AKI (acute kidney injury) (HCC) 01/25/2021  . Sepsis (HCC) 01/24/2021  . Hypokalemia 01/24/2021  . Acute respiratory failure (HCC) 01/24/2021  . Refractive error 12/31/2020  . Perceptive hearing loss, both sides 12/31/2020  . 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HCC) 12/27/2020  . Thrombocytopenia (HCC) 11/06/2020  . Leukocytosis 10/16/2020  . Contusion of lower leg 04/22/2020  . Leukopenia 01/08/2020  . Disorder of refraction and accommodation 01/24/2019  . Type 2 diabetes mellitus with hyperglycemia, without long-term current use of insulin (HCC) 01/24/2019  . Iron overload due to repeated red blood cell transfusions 11/26/2018  . 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 Symptomatic anemia 07/23/2018  . Encounter for antineoplastic chemotherapy 07/23/2018  . Goals of care, counseling/discussion 10/07/2017  . MDS (myelodysplastic syndrome) (HCC) 10/05/2017  . Neutropenia (HCC)   . Anemia 08/17/2017  . Hard of hearing 01/18/2014  . HTN (hypertension) 01/18/2014   PCP:  Hande, Vishwanath, MD Pharmacy:   [facility] - [place], [place] - [address] [place]  [address] Phone: [phone] Fax: [phone]     Social Determinants of Health (SDOH) Interventions    Readmission Risk Interventions Readmission Risk Prevention Plan 01/26/2021  Transportation Screening Complete  PCP or Specialist Appt within 3-5 Days Complete  Social Work Consult for Recovery Care Planning/Counseling Complete  Palliative Care Screening Not Applicable  Medication Review (RN Care Manager) Complete  Some recent  data might be hidden    

## 2021-01-26 NOTE — Progress Notes (Signed)
family states pt is coughing up blood. they did not save or show staff. placed a basin in room asked family to please have pt cough in there if it happens again. Notified MD Memon

## 2021-01-26 NOTE — Progress Notes (Signed)
PROGRESS NOTE    Paul Pham.  TJQ:[id number] DOB: [birth year] DOA: 01/24/2021 PCP: Lequita Asal, MD    Brief Narrative:  87 year old male with a history of AML, diabetes, CKD stage IIIa, admitted to the hospital with sepsis secondary to multifocal pneumonia.  He has been started on intravenous antibiotics.  Initially required BiPAP.  He has metabolic encephalopathy secondary to sepsis.  Noted to have significant anemia in the setting of AML, he was transfused 2 units of PRBC.  With multiple comorbidities, advanced age and general frailty, his long-term prognosis is poor   Assessment & Plan:   Principal Problem:   Sepsis ([place]) Active Problems:   Anemia in neoplastic disease   Hard of hearing   HTN (hypertension)   Type 2 diabetes mellitus with hyperglycemia, without long-term current use of insulin (HCC)   Leukocytosis   Thrombocytopenia (HCC)   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HCC)   Hypokalemia   Acute respiratory failure (HCC)   CKD (chronic kidney disease) stage 3, GFR 30-59 ml/min (HCC)   AKI (acute kidney injury) ([place])   Severe sepsis -Patient noted to have tachypnea, tachycardia on admission with source of infection being pneumonia.  Lactic acid was elevated -Blood cultures have not shown any growth -Currently on antibiotics -hemodynamics appear to be improving  Acute respiratory failure with hypoxia -Noted to have increased work of breathing on admission requiring BiPAP -He has since been weaned off of BiPAP and is on 4 L nasal cannula -We will continue to wean off oxygen as tolerated -Pulmonology following, appreciate assistance  Community-acquired pneumonia -Noted on CT chest to have bilateral multifocal pneumonia, most severe in the right lower lobe -Continue on ceftriaxone and azithromycin -strep pneumo antigen negative, legionella antigen in process -respiratory viral panel negative -covid negative  Anemia -Likely related to  AML -He has required blood transfusions in the past -Hemoglobin 6.4 on admission -He was transfused 2 units PRBC with follow-up hemoglobin 8.0 -Continue to follow and transfuse for hemoglobin less than 7  Thrombocytopenia -Secondary to AML in the setting of sepsis -Continue to monitor  Leukocytosis -Related to AML  Acute kidney injury on chronic kidney disease stage IIIa -Baseline creatinine around 1.3 -Creatinine currently at 1.9 -Suspect this is related to sepsis and anemia -will provide gentle hydration  -Continue to follow urine output and renal function -Renal ultrasound negative for hydronephrosis -Hold outpatient lisinopril  Diabetes -Holding oral agents -Continue on sliding scale insulin  Hypokalemia/hypomagnesemia -Replaced  AML -Followed by oncology has been receiving treatment recently -Family understands that this is not curative  Acute metabolic encephalopathy -Suspect is related to sepsis -Normally, family reports that he is alert and oriented -still has some confusion overnight, but appears to be better during the day -Continue to monitor  Goals of care -With patient's multiple comorbidities, advanced age and overall frail condition, his long-term prognosis with his current illness appears to be poor -This was explained to family and they verbalized understanding Warning-If patient does have any meaningful improvements, would consider de-escalating care   DVT prophylaxis: SCDs Start: 01/24/21 1457  Code Status: DNR Family Communication: Discussed with patient's son and wife at the bedside Disposition Plan: Status is: Inpatient  Remains inpatient appropriate because:Altered mental status, IV treatments appropriate due to intensity of illness or inability to take PO and Inpatient level of care appropriate due to severity of illness   Dispo: The patient is from: Home  Anticipated d/c is to: TBD              Patient currently is not  medically stable to d/c.   Difficult to place patient No    Consultants:   Pulmonology  oncology  Procedures:   Echo: EF 55 to 60% with grade 2 diastolic dysfunction  Antimicrobials:   Ceftriaxone  Azithromycin   Subjective: patient is sitting up in bed.  His wife is helping him shave.  He is calm, awake and alert.  Able to provide history.  Reports coughing overnight.  Family reported some confusion overnight, but appears to be better this morning  Objective: Vitals:   01/25/21 1930 01/25/21 2320 01/26/21 0423 01/26/21 0750  BP: 121/61 (!) 119/55 (!) 106/43 (!) 116/43  Pulse: 90 98 88 87  Resp: 20 16 18 20   Temp:  97.7 F (36.5 C) 97.6 F (36.4 C) 97.6 F (36.4 C)  TempSrc:  Oral Oral Oral  SpO2: 97% 95% 97% 97%  Weight:      Height:        Intake/Output Summary (Last 24 hours) at 01/26/2021 0927 Last data filed at 01/26/2021 0300 Gross per 24 hour  Intake 698.53 ml  Output --  Net 698.53 ml   Filed Weights   01/24/21 1126  Weight: 75.7 kg    Examination:  General exam: Alert, awake, oriented x 3 Respiratory system: Clear to auscultation. Respiratory effort normal. Cardiovascular system:RRR. No murmurs, rubs, gallops. Gastrointestinal system: Abdomen is nondistended, soft and nontender. No organomegaly or masses felt. Normal bowel sounds heard. Central nervous system: Alert and oriented. No focal neurological deficits. Extremities: No C/C/E, +pedal pulses Skin: No rashes, lesions or ulcers Psychiatry: Judgement and insight appear normal. Mood & affect appropriate.      Data Reviewed: I have personally reviewed following labs and imaging studies  CBC: Recent Labs  Lab 01/20/21 0844 01/22/21 0906 01/24/21 0849 01/24/21 1157 01/25/21 0135 01/25/21 0625 01/25/21 0833 01/26/21 0416  WBC 166.5* 159.5* 155.0* 181.6*  --  167.7*  --  125.1*  NEUTROABS 69.1* 70.6* 97.7* 86.8*  --   --   --   --   HGB 6.7* 7.4* 6.8* 6.4* 6.8* 7.6* 8.0* 7.8*  HCT  20.8* 23.6* 21.3* 19.7* 20.7* 22.8* 23.7* 23.8*  MCV 86.7 87.7 88.0 87.6  --  84.4  --  85.0  PLT 98* 81* 71* 72*  --  77*  --  65*   Basic Metabolic Panel: Recent Labs  Lab 01/20/21 0844 01/22/21 0906 01/24/21 1157 01/24/21 1644 01/25/21 0625 01/26/21 0416  NA 136 139 135  --  135 138  K 3.3* 3.1* 2.2* 2.9* 4.3 4.0  CL 99 104 99  --  102 104  CO2 24 28 22   --  23 23  GLUCOSE 228* 220* 248*  --  215* 180*  BUN 16 19 19   --  30* 40*  CREATININE 1.29* 1.31* 1.59*  --  2.01* 1.94*  CALCIUM 9.0 9.0 8.8*  --  8.1* 8.2*  MG  --   --   --  1.6* 2.0 2.3  PHOS  --   --   --  3.1 4.1 6.0*   GFR: Estimated Creatinine Clearance: 26 mL/min (A) (by C-G formula based on SCr of 1.94 mg/dL (H)). Liver Function Tests: Recent Labs  Lab 01/20/21 0844 01/22/21 0906 01/24/21 1157  AST 47* 48* 50*  ALT 40 41 40  ALKPHOS 57 51 53  BILITOT 0.9 0.7 0.8  PROT  7.1 7.0 6.9  ALBUMIN 3.9 3.8 3.6   No results for input(s): LIPASE, AMYLASE in the last 168 hours. No results for input(s): AMMONIA in the last 168 hours. Coagulation Profile: Recent Labs  Lab 01/24/21 1157 01/25/21 0625  INR 1.4* 1.5*   Cardiac Enzymes: No results for input(s): CKTOTAL, CKMB, CKMBINDEX, TROPONINI in the last 168 hours. BNP (last 3 results) No results for input(s): PROBNP in the last 8760 hours. HbA1C: Recent Labs    01/24/21 1553  HGBA1C 7.6*   CBG: Recent Labs  Lab 01/25/21 1548 01/25/21 2129 01/26/21 0012 01/26/21 0431 01/26/21 0751  GLUCAP 232* 231* 205* 189* 188*   Lipid Profile: No results for input(s): CHOL, HDL, LDLCALC, TRIG, CHOLHDL, LDLDIRECT in the last 72 hours. Thyroid Function Tests: No results for input(s): TSH, T4TOTAL, FREET4, T3FREE, THYROIDAB in the last 72 hours. Anemia Panel: No results for input(s): VITAMINB12, FOLATE, FERRITIN, TIBC, IRON, RETICCTPCT in the last 72 hours. Sepsis Labs: Recent Labs  Lab 01/24/21 1157 01/24/21 1450 01/24/21 1643 01/25/21 0625   PROCALCITON  --   --   --  7.85  LATICACIDVEN 3.2* 3.0* 5.0*  --     Recent Results (from the past 240 hour(s))  Resp Panel by RT-PCR (Flu A&B, Covid) Nasopharyngeal Swab     Status: None   Collection Time: 01/24/21 11:57 AM   Specimen: Nasopharyngeal Swab; Nasopharyngeal(NP) swabs in vial transport medium  Result Value Ref Range Status   SARS Coronavirus 2 by RT PCR NEGATIVE NEGATIVE Final    Comment: (NOTE) SARS-CoV-2 target nucleic acids are NOT DETECTED.  The SARS-CoV-2 RNA is generally detectable in upper respiratory specimens during the acute phase of infection. The lowest concentration of SARS-CoV-2 viral copies this assay can detect is 138 copies/mL. A negative result does not preclude SARS-Cov-2 infection and should not be used as the sole basis for treatment or other patient management decisions. A negative result may occur with  improper specimen collection/handling, submission of specimen other than nasopharyngeal swab, presence of viral mutation(s) within the areas targeted by this assay, and inadequate number of viral copies(<138 copies/mL). A negative result must be combined with clinical observations, patient history, and epidemiological information. The expected result is Negative.  Fact Sheet for Patients:  EntrepreneurPulse.com.au  Fact Sheet for Healthcare Providers:  IncredibleEmployment.be  This test is no t yet approved or cleared by the [place] FDA and  has been authorized for detection and/or diagnosis of SARS-CoV-2 by FDA under an Emergency Use Authorization (EUA). This EUA will remain  in effect (meaning this test can be used) for the duration of the COVID-19 declaration under Section 564(b)(1) of the Act, 21 U.S.C.section 360bbb-3(b)(1), unless the authorization is terminated  or revoked sooner.       Influenza A by PCR NEGATIVE NEGATIVE Final   Influenza B by PCR NEGATIVE NEGATIVE Final    Comment:  (NOTE) The Xpert Xpress SARS-CoV-2/FLU/RSV plus assay is intended as an aid in the diagnosis of influenza from Nasopharyngeal swab specimens and should not be used as a sole basis for treatment. Nasal washings and aspirates are unacceptable for Xpert Xpress SARS-CoV-2/FLU/RSV testing.  Fact Sheet for Patients: EntrepreneurPulse.com.au  Fact Sheet for Healthcare Providers: IncredibleEmployment.be  This test is not yet approved or cleared by the [place] FDA and has been authorized for detection and/or diagnosis of SARS-CoV-2 by FDA under an Emergency Use Authorization (EUA). This EUA will remain in effect (meaning this test can be used) for the duration of the  COVID-19 declaration under Section 564(b)(1) of the Act, 21 U.S.C. section 360bbb-3(b)(1), unless the authorization is terminated or revoked.  Performed at [facility], [address] [place], [place] [address]   Blood Culture (routine x 2)     Status: None (Preliminary result)   Collection Time: 01/24/21 11:57 AM   Specimen: BLOOD  Result Value Ref Range Status   Specimen Description BLOOD BLOOD LEFT HAND  Final   Special Requests   Final    BOTTLES DRAWN AEROBIC AND ANAEROBIC Blood Culture adequate volume   Culture   Final    NO GROWTH 2 DAYS Performed at [facility], [address], [place], [place] [address]    Report Status PENDING  Incomplete  Blood Culture (routine x 2)     Status: None (Preliminary result)   Collection Time: 01/24/21 11:57 AM   Specimen: BLOOD  Result Value Ref Range Status   Specimen Description BLOOD BLOOD LEFT ARM  Final   Special Requests   Final    BOTTLES DRAWN AEROBIC AND ANAEROBIC Blood Culture adequate volume   Culture   Final    NO GROWTH 2 DAYS Performed at [facility], [address] [place], [place] [address]    Report Status PENDING  Incomplete  Respiratory (~20 pathogens) panel by PCR     Status: None    Collection Time: 01/24/21  4:06 PM   Specimen: Nasopharyngeal Swab; Respiratory  Result Value Ref Range Status   Adenovirus NOT DETECTED NOT DETECTED Final   Coronavirus 229E NOT DETECTED NOT DETECTED Final    Comment: (NOTE) The Coronavirus on the Respiratory Panel, DOES NOT test for the novel  Coronavirus (2019 nCoV)    Coronavirus HKU1 NOT DETECTED NOT DETECTED Final   Coronavirus NL63 NOT DETECTED NOT DETECTED Final   Coronavirus OC43 NOT DETECTED NOT DETECTED Final   Metapneumovirus NOT DETECTED NOT DETECTED Final   Rhinovirus / Enterovirus NOT DETECTED NOT DETECTED Final   Influenza A NOT DETECTED NOT DETECTED Final   Influenza B NOT DETECTED NOT DETECTED Final   Parainfluenza Virus 1 NOT DETECTED NOT DETECTED Final   Parainfluenza Virus 2 NOT DETECTED NOT DETECTED Final   Parainfluenza Virus 3 NOT DETECTED NOT DETECTED Final   Parainfluenza Virus 4 NOT DETECTED NOT DETECTED Final   Respiratory Syncytial Virus NOT DETECTED NOT DETECTED Final   Bordetella pertussis NOT DETECTED NOT DETECTED Final   Bordetella Parapertussis NOT DETECTED NOT DETECTED Final   Chlamydophila pneumoniae NOT DETECTED NOT DETECTED Final   Mycoplasma pneumoniae NOT DETECTED NOT DETECTED Final    Comment: Performed at [facility], [place]. [address], [place], [place] [address]  MRSA PCR Screening     Status: None   Collection Time: 01/24/21  4:06 PM   Specimen: Nasal Mucosa; Nasopharyngeal  Result Value Ref Range Status   MRSA by PCR NEGATIVE NEGATIVE Final    Comment:        The GeneXpert MRSA Assay (FDA approved for NASAL specimens only), is one component of a comprehensive MRSA colonization surveillance program. It is not intended to diagnose MRSA infection nor to guide or monitor treatment for MRSA infections. Performed at [facility], [address], [place], [place] [address]          Radiology Studies: CT CHEST WO CONTRAST  Result Date: 01/24/2021 CLINICAL DATA:   87 year old with cough and dyspnea. Acute myeloid leukemia. EXAM: CT CHEST WITHOUT CONTRAST TECHNIQUE: Multidetector CT imaging of the chest was  performed following the standard protocol without IV contrast. COMPARISON:  CT abdomen 09/16/2017 FINDINGS: Cardiovascular: Normal caliber of the thoracic aorta with diffuse calcifications. Coronary arteries are heavily calcified. Minimal pericardial fluid. Heart size is within normal limits. Mediastinum/Nodes: No significant mediastinal lymphadenopathy. Largest lymph node measures 1.0 cm in the short axis in the precarinal region on sequence 2 image 68. Limited evaluation for hilar lymph node enlargement without intravascular contrast. Moderate to large sized hiatal hernia with contrast in the lumen. Small right supraclavicular irregular lymph nodes are nonspecific. Small axillary lymph nodes bilaterally. Lungs/Pleura: Trachea is patent. Large amount of airspace disease in the right lower lobe. Patchy airspace densities in the left upper lobe and left lower lobe. There is minimal disease in the right middle lobe and right upper lobe. Findings are most compatible with pneumonia. No significant pleural fluid. Upper Abdomen: Splenomegaly. Musculoskeletal: No acute bone abnormality. IMPRESSION: 1. Bilateral multifocal pneumonia. The disease is most severe in the right lower lobe. 2. Splenomegaly. This is likely associated with patient's acute myeloid leukemia. 3. Moderate to large sized hiatal hernia. 4.  Aortic Atherosclerosis (ICD10-I70.0). Electronically Signed   By: Markus Daft M.D.   On: 01/24/2021 16:07   US RENAL  Result Date: 01/25/2021 CLINICAL DATA:  Acute renal insufficiency. EXAM: RENAL / URINARY TRACT ULTRASOUND COMPLETE COMPARISON:  Body CT September 16, 2017 FINDINGS: Right Kidney: Renal measurements: 10.4 x 5.3 x 5.6 cm = volume: 160 mL. Mildly increased echogenicity. No hydronephrosis visualized. Benign-appearing 7 mm cyst is seen exophytic off of the midpole  region of the right kidney. Left Kidney: Renal measurements: 12.7 x 7.5 x 5.2 cm = volume: 259 mL. Mildly increased echogenicity. No mass or hydronephrosis visualized. Bladder: Appears normal for degree of bladder distention. Other: None. IMPRESSION: Mildly increased cortical echogenicity of bilateral kidneys, consistent with medical renal disease. Electronically Signed   By: Fidela Salisbury M.D.   On: 01/25/2021 19:39   DG Chest Port 1 View  Result Date: 01/24/2021 CLINICAL DATA:  Sepsis. EXAM: PORTABLE CHEST 1 VIEW COMPARISON:  November 08, 2006. FINDINGS: The heart size and mediastinal contours are within normal limits. Mild bibasilar subsegmental atelectasis is noted. The visualized skeletal structures are unremarkable. IMPRESSION: Mild bibasilar subsegmental atelectasis. Aortic Atherosclerosis (ICD10-I70.0). Electronically Signed   By: Marijo Conception M.D.   On: 01/24/2021 12:24   ECHOCARDIOGRAM COMPLETE  Result Date: 01/25/2021    ECHOCARDIOGRAM REPORT   Patient Name:   Paul Pham. Date of Exam: 01/25/2021 Medical Rec #:  [id number]        Height:       73.0 in Accession #:    [id number]       Weight:       166.9 lb Date of Birth:  [birth year]        BSA:          1.992 m Patient Age:    23 years         BP:           104/40 mmHg Patient Gender: M                HR:           97 bpm. Exam Location:  [facility] Procedure: 2D Echo Indications:     CHF I50.21  History:         Patient has no prior history of Echocardiogram examinations.  Sonographer:     Kathlen Brunswick RDCS Referring Phys:  [id number] FUAD ALESKEROV Diagnosing  Phys: Ida Rogue MD  Sonographer Comments: Technically difficult study due to poor echo windows. Image acquisition challenging due to uncooperative patient. IMPRESSIONS  1. Left ventricular ejection fraction, by estimation, is 55 to 60%. The left ventricle has normal function. The left ventricle has no regional wall motion abnormalities. Left ventricular diastolic parameters are  consistent with Grade II diastolic dysfunction (pseudonormalization).  2. Right ventricular systolic function is normal. The right ventricular size is normal. There is moderately elevated pulmonary artery systolic pressure. The estimated right ventricular systolic pressure is 01.6 mmHg.  3. Left atrial size was mildly dilated.  4. Tricuspid valve regurgitation is mild to moderate.  5. The inferior vena cava is dilated in size with <50% respiratory variability, suggesting right atrial pressure of 15 mmHg. FINDINGS  Left Ventricle: Left ventricular ejection fraction, by estimation, is 55 to 60%. The left ventricle has normal function. The left ventricle has no regional wall motion abnormalities. The left ventricular internal cavity size was normal in size. There is  no left ventricular hypertrophy. Left ventricular diastolic parameters are consistent with Grade II diastolic dysfunction (pseudonormalization). Right Ventricle: The right ventricular size is normal. No increase in right ventricular wall thickness. Right ventricular systolic function is normal. There is moderately elevated pulmonary artery systolic pressure. The tricuspid regurgitant velocity is 2.97 m/s, and with an assumed right atrial pressure of 15 mmHg, the estimated right ventricular systolic pressure is 01.0 mmHg. Left Atrium: Left atrial size was mildly dilated. Right Atrium: Right atrial size was normal in size. Pericardium: There is no evidence of pericardial effusion. Mitral Valve: The mitral valve is normal in structure. No evidence of mitral valve regurgitation. No evidence of mitral valve stenosis. Tricuspid Valve: The tricuspid valve is normal in structure. Tricuspid valve regurgitation is mild to moderate. No evidence of tricuspid stenosis. Aortic Valve: The aortic valve is normal in structure. Aortic valve regurgitation is not visualized. Aortic regurgitation PHT measures 520 msec. No aortic stenosis is present. Aortic valve peak gradient  measures 8.8 mmHg. Pulmonic Valve: The pulmonic valve was normal in structure. Pulmonic valve regurgitation is not visualized. No evidence of pulmonic stenosis. Aorta: The aortic root is normal in size and structure. Venous: The inferior vena cava is dilated in size with less than 50% respiratory variability, suggesting right atrial pressure of 15 mmHg. IAS/Shunts: No atrial level shunt detected by color flow Doppler.  LEFT VENTRICLE PLAX 2D LVIDd:         3.98 cm Diastology LVIDs:         2.72 cm LV e' medial:    7.18 cm/s LV PW:         1.31 cm LV E/e' medial:  11.3 LV IVS:        1.34 cm LV e' lateral:   9.46 cm/s                        LV E/e' lateral: 8.6  RIGHT VENTRICLE RV Basal diam:  3.14 cm RV S prime:     18.30 cm/s TAPSE (M-mode): 2.2 cm LEFT ATRIUM         Index LA diam:    5.10 cm 2.56 cm/m  AORTIC VALVE              PULMONIC VALVE AV Vmax:      148.00 cm/s PV Vmax:       1.02 m/s AV Peak Grad: 8.8 mmHg    PV Peak grad:  4.2 mmHg LVOT  Vmax:    91.40 cm/s LVOT Vmean:   57.400 cm/s LVOT VTI:     0.204 m AI PHT:       520 msec MITRAL VALVE               TRICUSPID VALVE MV Area (PHT): 5.16 cm    TV Peak grad:   32.1 mmHg MV Decel Time: 147 msec    TV Vmax:        2.83 m/s MV E velocity: 81.40 cm/s  TR Peak grad:   35.3 mmHg MV A velocity: 73.30 cm/s  TR Vmax:        297.00 cm/s MV E/A ratio:  1.11                            SHUNTS                            Systemic VTI: 0.20 m Ida Rogue MD Electronically signed by Ida Rogue MD Signature Date/Time: 01/25/2021/12:51:54 PM    Final         Scheduled Meds: . sodium chloride   Intravenous Once  . acyclovir  400 mg Oral BID  . allopurinol  300 mg Oral Daily  . calcium carbonate  1 tablet Oral BID WC  . finasteride  5 mg Oral Daily  . insulin aspart  0-15 Units Subcutaneous Q4H  . lidocaine-prilocaine   Topical Once  . omega-3 acid ethyl esters  1 g Oral Daily  . potassium chloride  20 mEq Oral BID  . simvastatin  40 mg Oral Daily  .  vitamin B-12  1,000 mcg Oral Daily   Continuous Infusions: . azithromycin Stopped (01/25/21 1650)  . cefTRIAXone (ROCEPHIN)  IV 2 g (01/26/21 0003)  . lactated ringers       LOS: 2 days    Time spent: 59mins    Kathie Dike, MD [facility]   If 7PM-7AM, please contact night-coverage [email or web address]  01/26/2021, 9:27 AM

## 2021-01-26 NOTE — Progress Notes (Signed)
0945. Family transferred pt to bathroom for pt to have a BM. Did not wait on staff to assist with transfer. Son says he "carried" pt in bathroom. Educated family that was not safe and needed to wait on staff to assist patient.

## 2021-01-26 NOTE — Progress Notes (Signed)
Pt weaned off Bipap in ED prior to floor admission

## 2021-01-26 NOTE — Progress Notes (Signed)
Pharmacy Electrolyte Monitoring Consult:  Pharmacy consulted to assist in monitoring and replacing electrolytes in this 87 y.o. male admitted on 01/24/2021 with Fever   Labs:  Sodium (mmol/L)  Date Value  01/26/2021 138   Potassium (mmol/L)  Date Value  01/26/2021 4.0   Magnesium (mg/dL)  Date Value  01/26/2021 2.3   Phosphorus (mg/dL)  Date Value  01/26/2021 6.0 (H)   Calcium (mg/dL)  Date Value  01/26/2021 8.2 (L)   Albumin (g/dL)  Date Value  01/24/2021 3.6    Assessment/Plan: 05/29  K 4.0  Mag 2.3  Phos 6.0  Scr 1.94 -currently ordered KCL 20 meq PO bid -no further replacement at this time -f/u electrolytes with am labs   Brynlyn Dade A 01/26/2021 9:24 AM

## 2021-01-26 NOTE — Evaluation (Signed)
Occupational Therapy Evaluation Patient Details Name: Paul Pham. 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 Pt presented from [facility] d/t being Afib. Found to be febrile with increased WBC. Pt adm for sepsis 2/2 PNA.   Clinical Impression   Pt seen for OT evaluation this date in setting of acute hospitalization d/t sepsis. At baseline, pt lives in [facility] with spouse with 2 STE. Pt reports able to amb short household distances with SPC. Pt's spouse reports that he has been requiring assistance with bathing/dresing in recent months, with increased need for assistance in days leading up to hospitalization. Pt presents this date with fatigue, weakness, general deconditioning and decreased fxl activity tolerance impacting his ability to safely and efficiently perform ADLs/ADL mobility. On OT assessment this date, pt requires: CGA/MIN A with RW for ADL transfers, CGA/MIN A for seated LB ADLs d/t limited dynamic sitting balance/tolerance. SETUP for seated UB ADLs. Increased time required in all aspects of functional task performance d/t general deconditioning. OT educates pt and family re: safe sequence of use of RW and use of incentive spirometer and flutter valve to improve lung strength. Pt and family with good understanding. Pt left in bed with all needs met and in reach. Will continue to follow acutely and anticipate pt will require f/u [place] services to ensure safety with ADLs in the natural environment.    Follow Up Recommendations  Home health OT;Supervision - Intermittent    Equipment Recommendations  Tub/shower seat;Other (comment) (2ww)    Recommendations for Other Services       Precautions / Restrictions Precautions Precautions: Fall Restrictions Weight Bearing Restrictions: No      Mobility Bed Mobility Overal bed mobility: Modified  Independent             General bed mobility comments: increased time and effort, HOB elevated, use of bed rails    Transfers Overall transfer level: Needs assistance Equipment used: Rolling walker (2 wheeled) Transfers: Sit to/from Stand Sit to Stand: Min guard;Min assist              Balance Overall balance assessment: Needs assistance Sitting-balance support: Feet supported Sitting balance-Leahy Scale: Good     Standing balance support: Single extremity supported;Bilateral upper extremity supported Standing balance-Leahy Scale: Fair Standing balance comment: benefits from B UE support, but able to sustain static stand with unilateral support                           ADL either performed or assessed with clinical judgement   ADL Overall ADL's : Needs assistance/impaired                                       General ADL Comments: CGA/MIN A with RW for ADL transfers, CGA/MIN A for seated LB ADLs d/t limited dynamic sitting balance/tolerance. SETUP for seated UB ADLs. Increased time required in all aspects of functional task performance d/t general deconditioning.     Vision Baseline Vision/History: Wears glasses Wears Glasses: At all times Patient Visual Report: No change from baseline       Perception     Praxis      Pertinent Vitals/Pain Pain Assessment: No/denies pain     Hand  Dominance     Extremity/Trunk Assessment Upper Extremity Assessment Upper Extremity Assessment: Overall WFL for tasks assessed;Generalized weakness (ROM WFL, MMT grossly 4-/5)   Lower Extremity Assessment Lower Extremity Assessment: Overall WFL for tasks assessed;Generalized weakness (ROM WFL, able to rotate as needed for LB ADLs, generally weak with MMT grossly 4-/5)       Communication Communication Communication: HOH (hearing aides, R ear better)   Cognition Arousal/Alertness: Awake/alert Behavior During Therapy: WFL for tasks  assessed/performed Overall Cognitive Status: Within Functional Limits for tasks assessed                                 General Comments: delayed repsonses, possibly 2/2 HOH. Pt is oriented and appropriate throughout.   General Comments       Exercises Other Exercises Other Exercises: OT Educates re role of OT, importance of OOB Activity, use of IS and flutter valve to improve lung function and optimize oxygenation to wean off O2 and correct use/hand placement with RW for transfers. Pt and family with good understanding.   Shoulder Instructions      Home Living Family/patient expects to be discharged to:: Private residence Living Arrangements: Spouse/significant other Available Help at Discharge: Family;Available 24 hours/day Type of Home: House Home Access: Stairs to enter [employer] of Steps: 2 Entrance Stairs-Rails: None Home Layout: One level               Home Equipment: Cane - single point          Prior Functioning/Environment Level of Independence: Needs assistance  Gait / Transfers Assistance Needed: Pt is MOD I for fxl mobility for short household bouts with SPC. Pt reports he used to be a part of a 74m/day walking program, but has been declining with his tolerance for distance since CA dx and treatment. ADL's / Homemaking Assistance Needed: Pt has been requiring increased assist with bathing/dressing from his spouse over at least ~5-6 months. In addition, pt's spouse performs all IADLs including driving and running errands   Comments: was highly active but endorses decline. Pt with no recent falls, but endorses being "wobbly"/near misses.        OT Problem List: Decreased strength;Decreased activity tolerance;Cardiopulmonary status limiting activity      OT Treatment/Interventions: Self-care/ADL training;Therapeutic activities;DME and/or AE instruction;Therapeutic exercise;Energy conservation;Patient/family education    OT  Goals(Current goals can be found in the care plan section) Acute Rehab OT Goals Patient Stated Goal: to go home OT Goal Formulation: With patient Time For Goal Achievement: 02/09/21 Potential to Achieve Goals: Good ADL Goals Pt Will Transfer to Toilet: with supervision;ambulating (with LRAD to/from restroom with BSC over standard commode to elevate and use of grab bars.) Pt Will Perform Toileting - Clothing Manipulation and hygiene: with supervision;sit to/from stand Pt/caregiver will Perform Home Exercise Program: Increased strength;Both right and left upper extremity;With Supervision  OT Frequency: Min 2X/week   Barriers to D/C:            Co-evaluation              AM-PAC OT "6 Clicks" Daily Activity     Outcome Measure Help from another person eating meals?: None Help from another person taking care of personal grooming?: A Little Help from another person toileting, which includes using toliet, bedpan, or urinal?: A Little Help from another person bathing (including washing, rinsing, drying)?: A Little Help from another person to put on and  taking off regular upper body clothing?: A Little Help from another person to put on and taking off regular lower body clothing?: A Little 6 Click Score: 19   End of Session Equipment Utilized During Treatment: Rolling walker;Oxygen (able to wean to RA temporarily in sitting, but O2 drops back to 88-89% on RA while laying in bed, OT re-applied nasal cannula.) Nurse Communication: Mobility status  Activity Tolerance: Patient tolerated treatment well;Patient limited by fatigue Patient left: in bed;with call bell/phone within reach;with family/visitor present  OT Visit Diagnosis: Unsteadiness on feet (R26.81);Muscle weakness (generalized) (M62.81)                Time: 7129-2909 OT Time Calculation (min): 45 min Charges:  OT General Charges $OT Visit: 1 Visit OT Evaluation $OT Eval Moderate Complexity: 1 Mod OT Treatments $Self  Care/Home Management : 8-22 mins $Therapeutic Activity: 8-22 mins  Gerrianne Scale, MS, OTR/L ascom [phone] 01/26/21, 4:10 PM

## 2021-01-27 DIAGNOSIS — C9200 Acute myeloblastic leukemia, not having achieved remission: Secondary | ICD-10-CM | POA: Diagnosis not present

## 2021-01-27 DIAGNOSIS — A419 Sepsis, unspecified organism: Secondary | ICD-10-CM | POA: Diagnosis not present

## 2021-01-27 DIAGNOSIS — J9600 Acute respiratory failure, unspecified whether with hypoxia or hypercapnia: Secondary | ICD-10-CM | POA: Diagnosis not present

## 2021-01-27 DIAGNOSIS — N179 Acute kidney failure, unspecified: Secondary | ICD-10-CM | POA: Diagnosis not present

## 2021-01-27 LAB — CBC
HCT: 23.3 % — ABNORMAL LOW (ref 39.0–52.0)
Hemoglobin: 7.8 g/dL — ABNORMAL LOW (ref 13.0–17.0)
MCH: 28.5 pg (ref 26.0–34.0)
MCHC: 33.5 g/dL (ref 30.0–36.0)
MCV: 85 fL (ref 80.0–100.0)
Platelets: 49 10*3/uL — ABNORMAL LOW (ref 150–400)
RBC: 2.74 MIL/uL — ABNORMAL LOW (ref 4.22–5.81)
RDW: 18.7 % — ABNORMAL HIGH (ref 11.5–15.5)
WBC: 75.7 10*3/uL (ref 4.0–10.5)
nRBC: 0.4 % — ABNORMAL HIGH (ref 0.0–0.2)

## 2021-01-27 LAB — GLUCOSE, CAPILLARY
Glucose-Capillary: 136 mg/dL — ABNORMAL HIGH (ref 70–99)
Glucose-Capillary: 181 mg/dL — ABNORMAL HIGH (ref 70–99)
Glucose-Capillary: 270 mg/dL — ABNORMAL HIGH (ref 70–99)
Glucose-Capillary: 188 mg/dL — ABNORMAL HIGH (ref 70–99)
Glucose-Capillary: 205 mg/dL — ABNORMAL HIGH (ref 70–99)

## 2021-01-27 LAB — LEGIONELLA PNEUMOPHILA SEROGP 1 UR AG: L. pneumophila Serogp 1 Ur Ag: NEGATIVE

## 2021-01-27 LAB — BASIC METABOLIC PANEL
Anion gap: 12 (ref 5–15)
BUN: 41 mg/dL — ABNORMAL HIGH (ref 8–23)
CO2: 23 mmol/L (ref 22–32)
Calcium: 7.8 mg/dL — ABNORMAL LOW (ref 8.9–10.3)
Chloride: 104 mmol/L (ref 98–111)
Creatinine, Ser: 1.62 mg/dL — ABNORMAL HIGH (ref 0.61–1.24)
GFR, Estimated: 40 mL/min — ABNORMAL LOW (ref >60)
Glucose, Bld: 140 mg/dL — ABNORMAL HIGH (ref 70–99)
Potassium: 4 mmol/L (ref 3.5–5.1)
Sodium: 139 mmol/L (ref 135–145)

## 2021-01-27 MED ORDER — SODIUM CHLORIDE 0.9 % IV SOLN
INTRAVENOUS | Status: DC | PRN
  Administered 2021-01-27: 250 mL via INTRAVENOUS

## 2021-01-27 NOTE — Evaluation (Signed)
Physical Therapy Evaluation Patient Details Name: Paul Pham. 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 Pt presented from [facility] d/t being Afib. Found to be febrile with increased WBC. Pt adm for sepsis 2/2 PNA.  Clinical Impression  Patient seated in recliner upon arrival to room; wife present in room, supportive and encouraging to patient.  Patient agreeable to participation with session, but does request return to bed end of session. Alert and oriented; follows commands and participates well; generally HOH, does hear best from R ear and prefers to see face/mouth to optimize comprehension.  Generally weak and deconditioned throughout all extremities; however, no focal weakness appreciated.  Able to complete bed mobility with sup/mod indep; sit/stand, basic transfers and gait (120') with RW, cga/min assist.  Demonstrates forward flexed posture, mod WBing on RW; slow and deliberate stepping pattern, but no overt buckling or LOB.  Broad turning radius, indicative of decreased balance reactions.  Do recommend continued use of RW for all mobility; patient voices agreement/understanding. Would benefit from skilled PT to address above deficits and promote optimal return to PLOF.; Recommend transition to HHPT upon discharge from acute hospitalization.     Follow Up Recommendations Home health PT    Equipment Recommendations  Rolling walker with 5" wheels;3in1 (PT)    Recommendations for Other Services       Precautions / Restrictions Precautions Precautions: Fall Restrictions Weight Bearing Restrictions: No      Mobility  Bed Mobility Overal bed mobility: Modified Independent Bed Mobility: Sit to Supine       Sit to supine: Supervision;Modified independent (Device/Increase time)   General bed mobility comments: use of momentum to  elevate bilat LEs over edge of bed    Transfers Overall transfer level: Needs assistance Equipment used: Rolling walker (2 wheeled) Transfers: Sit to/from Stand Sit to Stand: Min assist         General transfer comment: assist for lift off, requires bilat UE for support  Ambulation/Gait Ambulation/Gait assistance: Min guard;Min assist Gait Distance (Feet): 120 Feet Assistive device: Rolling walker (2 wheeled)       General Gait Details: forward flexed posture, mod WBing on RW; slow and deliberate stepping pattern, but no overt buckling or LOB.  Broad turning radius, indicative of decreased balance reactions.  Do recommend continued use of RW for all mobility; patient voices agreement/understanding.  Stairs            Wheelchair Mobility    Modified Rankin (Stroke Patients Only)       Balance Overall balance assessment: Needs assistance Sitting-balance support: No upper extremity supported;Feet supported Sitting balance-Leahy Scale: Normal     Standing balance support: Bilateral upper extremity supported Standing balance-Leahy Scale: Fair                               Pertinent Vitals/Pain Pain Assessment: No/denies pain    Home Living Family/patient expects to be discharged to:: Private residence   Available Help at Discharge: Family;Available 24 hours/day Type of Home: House Home Access: Stairs to enter Entrance Stairs-Rails: None Entrance Stairs-Number of Steps: 2 Home Layout: One level Home Equipment: Cane - single point      Prior Function Level of Independence: Needs assistance   Gait / Transfers Assistance Needed: Pt is MOD I for  fxl mobility for short household bouts with SPC. Pt reports he used to be a part of a 25mi/day walking program, but has been declining with his tolerance for distance since CA dx and treatment.  ADL's / Homemaking Assistance Needed: Pt has been requiring increased assist with bathing/dressing from his spouse  over at least ~5-6 months. In addition, pt's spouse performs all IADLs including driving and running errands        Hand Dominance        Extremity/Trunk Assessment   Upper Extremity Assessment Upper Extremity Assessment: Overall WFL for tasks assessed    Lower Extremity Assessment Lower Extremity Assessment: Generalized weakness (grossly 4-/5 throughout; no focal weakness appreciated)       Communication   Communication: HOH (hears best from R ear, prefers being able to see face/mouth)  Cognition Arousal/Alertness: Awake/alert Behavior During Therapy: WFL for tasks assessed/performed Overall Cognitive Status: Within Functional Limits for tasks assessed                                        General Comments      Exercises     Assessment/Plan    PT Assessment Patient needs continued PT services  PT Problem List Decreased strength;Decreased activity tolerance;Decreased balance;Decreased mobility;Decreased knowledge of use of DME;Decreased safety awareness;Decreased knowledge of precautions;Cardiopulmonary status limiting activity       PT Treatment Interventions DME instruction;Gait training;Stair training;Functional mobility training;Therapeutic activities;Therapeutic exercise;Balance training;Patient/family education    PT Goals (Current goals can be found in the Care Plan section)  Acute Rehab PT Goals Patient Stated Goal: to go home PT Goal Formulation: With patient Time For Goal Achievement: 02/10/21 Potential to Achieve Goals: Good    Frequency Min 2X/week   Barriers to discharge        Co-evaluation               AM-PAC PT "6 Clicks" Mobility  Outcome Measure Help needed turning from your back to your side while in a flat bed without using bedrails?: None Help needed moving from lying on your back to sitting on the side of a flat bed without using bedrails?: None Help needed moving to and from a bed to a chair (including a  wheelchair)?: A Little Help needed standing up from a chair using your arms (e.g., wheelchair or bedside chair)?: A Little Help needed to walk in hospital room?: A Little Help needed climbing 3-5 steps with a railing? : A Little 6 Click Score: 20    End of Session Equipment Utilized During Treatment: Gait belt Activity Tolerance: Patient tolerated treatment well Patient left: in bed;with call bell/phone within reach;with family/visitor present;with bed alarm set Nurse Communication: Mobility status PT Visit Diagnosis: Muscle weakness (generalized) (M62.81);Difficulty in walking, not elsewhere classified (R26.2)    Time: 1105-1120 PT Time Calculation (min) (ACUTE ONLY): 15 min   Charges:   PT Evaluation $PT Eval Moderate Complexity: 1 Mod         Regina Ganci H. Owens Shark, PT, DPT, NCS 01/27/21, 1:14 PM [phone]

## 2021-01-27 NOTE — Progress Notes (Signed)
Pharmacy Electrolyte Monitoring Consult:  Labs:  Sodium (mmol/L)  Date Value  01/27/2021 139   Potassium (mmol/L)  Date Value  01/27/2021 4.0   Magnesium (mg/dL)  Date Value  01/26/2021 2.3   Phosphorus (mg/dL)  Date Value  01/26/2021 6.0 (H)   Calcium (mg/dL)  Date Value  01/27/2021 7.8 (L)   Albumin (g/dL)  Date Value  01/24/2021 3.6    Assessment: 87 y.o. male with medical history significant for AML on chemotherapy, hypertension, diabetes mellitus type 2 and BPH who presented [facility] the ER from the cancer center for evaluation of fever. His renal function has improved since admission. He has been receiving oral KCl 20 mEq BID since 05/28   Plan:  continue KCL 20 meq PO bid  no further replacement at this time  f/u electrolytes with am labs   Dallie Piles 01/27/2021 8:33 AM

## 2021-01-27 NOTE — Progress Notes (Signed)
PROGRESS NOTE    Paul Pham.  UTM:[id number] DOB: [birth year] DOA: 01/24/2021 PCP: Tracie Harrier, MD    Brief Narrative:  87 year old male with a history of AML, diabetes, CKD stage IIIa, admitted to the hospital with sepsis secondary to multifocal pneumonia.  He has been started on intravenous antibiotics.  Initially required BiPAP.  He has metabolic encephalopathy secondary to sepsis.  Noted to have significant anemia in the setting of AML, he was transfused 2 units of PRBC.  With multiple comorbidities, advanced age and general frailty, his long-term prognosis is poor   Assessment & Plan:   Principal Problem:   Sepsis ([place]) Active Problems:   Anemia in neoplastic disease   Hard of hearing   HTN (hypertension)   Type 2 diabetes mellitus with hyperglycemia, without long-term current use of insulin (HCC)   Leukocytosis   Thrombocytopenia (HCC)   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HCC)   Hypokalemia   Acute respiratory failure (HCC)   CKD (chronic kidney disease) stage 3, GFR 30-59 ml/min (HCC)   AKI (acute kidney injury) ([place])   Severe sepsis -Patient noted to have tachypnea, tachycardia on admission with source of infection being pneumonia.  Lactic acid was elevated -Blood cultures have not shown any growth -Currently on antibiotics -hemodynamics appear to be improving  Acute respiratory failure with hypoxia -Noted to have increased work of breathing on admission requiring BiPAP -He has since been weaned off of BiPAP and currently is on 2 L nasal cannula -We will continue to wean off oxygen as tolerated -Pulmonology following, appreciate assistance  Community-acquired pneumonia -Noted on CT chest to have bilateral multifocal pneumonia, most severe in the right lower lobe -Continue on ceftriaxone and azithromycin -strep pneumo antigen negative, legionella antigen in process -respiratory viral panel negative -covid negative  Anemia -Likely  related to AML -He has required blood transfusions in the past -Hemoglobin 6.4 on admission -He was transfused 2 units PRBC with follow-up hemoglobin 8.0 -Continue to follow and transfuse for hemoglobin less than 7  Thrombocytopenia -Secondary to AML in the setting of sepsis -he did receive chemo approx 1 week ago, which could be contributing -platelets down to 49K at present -no signs of bleeding at present -Continue to monitor  Leukocytosis -Related to AML  Acute kidney injury on chronic kidney disease stage IIIa -Baseline creatinine around 1.3 -creatinine on admission 2.0 -Creatinine currently at 1.6 -Suspect this is related to sepsis and anemia -will provide gentle hydration  -Continue to follow urine output and renal function -Renal ultrasound negative for hydronephrosis -Hold outpatient lisinopril  Diabetes -Holding oral agents -Continue on sliding scale insulin  Hypokalemia/hypomagnesemia -Replaced  AML -Followed by oncology has been receiving treatment recently -Family understands that this is not curative  Acute metabolic encephalopathy -Suspect is related to sepsis -Normally, family reports that he is alert and oriented -still has some confusion overnight, but appears to be better during the day -overall seems to be improving -Continue to monitor  Generalized weakness -seen by OT with recommendations for [facility] -family has concerns of patient going home since it is only him and elderly wife at home -will await PT recommendations  Goals of care -With patient's multiple comorbidities, advanced age and overall frail condition, his long-term prognosis with his current illness appears to be poor -This was explained to family and they verbalized understanding If patient does have any meaningful improvements, would consider de-escalating care   DVT prophylaxis: SCDs Start: 01/24/21 1457  Code Status: DNR Family Communication:  Discussed with patient's son and  wife at the bedside Disposition Plan: Status is: Inpatient  Remains inpatient appropriate because:Altered mental status, IV treatments appropriate due to intensity of illness or inability to take PO and Inpatient level of care appropriate due to severity of illness   Dispo: The patient is from: Home              Anticipated d/c is to: TBD              Patient currently is not medically stable to d/c.   Difficult to place patient No    Consultants:   Pulmonology  oncology  Procedures:   Echo: EF 55 to 60% with grade 2 diastolic dysfunction  Antimicrobials:   Ceftriaxone  Azithromycin   Subjective: Patient is sitting up in chair. Feels that shortness of breath is improving. Cough improving.  Objective: Vitals:   01/26/21 1949 01/27/21 0012 01/27/21 0324 01/27/21 0750  BP: (!) 125/49 (!) 110/49 (!) 119/52 (!) 113/53  Pulse: 88 79 76 79  Resp: 16 16 18 15   Temp: 97.7 F (36.5 C) 98.3 F (36.8 C)  98 F (36.7 C)  TempSrc: Oral Oral    SpO2: 98% 93% 98% 97%  Weight:      Height:        Intake/Output Summary (Last 24 hours) at 01/27/2021 1013 Last data filed at 01/27/2021 1012 Gross per 24 hour  Intake 1600 ml  Output --  Net 1600 ml   Filed Weights   01/24/21 1126  Weight: 75.7 kg    Examination:  General exam: Alert, awake, oriented x 3 Respiratory system: Clear to auscultation. Respiratory effort normal. Cardiovascular system:RRR. No murmurs, rubs, gallops. Gastrointestinal system: Abdomen is nondistended, soft and nontender. No organomegaly or masses felt. Normal bowel sounds heard. Central nervous system: Alert and oriented. No focal neurological deficits. Extremities: No C/C/E, +pedal pulses Skin: No rashes, lesions or ulcers Psychiatry: Judgement and insight appear normal. Mood & affect appropriate.       Data Reviewed: I have personally reviewed following labs and imaging studies  CBC: Recent Labs  Lab 01/22/21 0906 01/24/21 0849  01/24/21 1157 01/25/21 0135 01/25/21 0625 01/25/21 0833 01/26/21 0416 01/27/21 0322  WBC 159.5* 155.0* 181.6*  --  167.7*  --  125.1* 75.7*  NEUTROABS 70.6* 97.7* 86.8*  --   --   --   --   --   HGB 7.4* 6.8* 6.4* 6.8* 7.6* 8.0* 7.8* 7.8*  HCT 23.6* 21.3* 19.7* 20.7* 22.8* 23.7* 23.8* 23.3*  MCV 87.7 88.0 87.6  --  84.4  --  85.0 85.0  PLT 81* 71* 72*  --  77*  --  65* 49*   Basic Metabolic Panel: Recent Labs  Lab 01/22/21 0906 01/24/21 1157 01/24/21 1644 01/25/21 0625 01/26/21 0416 01/27/21 0322  NA 139 135  --  135 138 139  K 3.1* 2.2* 2.9* 4.3 4.0 4.0  CL 104 99  --  102 104 104  CO2 28 22  --  23 23 23   GLUCOSE 220* 248*  --  215* 180* 140*  BUN 19 19  --  30* 40* 41*  CREATININE 1.31* 1.59*  --  2.01* 1.94* 1.62*  CALCIUM 9.0 8.8*  --  8.1* 8.2* 7.8*  MG  --   --  1.6* 2.0 2.3  --   PHOS  --   --  3.1 4.1 6.0*  --    GFR: Estimated Creatinine Clearance: 31.2 mL/min (A) (by C-G  formula based on SCr of 1.62 mg/dL (H)). Liver Function Tests: Recent Labs  Lab 01/22/21 0906 01/24/21 1157  AST 48* 50*  ALT 41 40  ALKPHOS 51 53  BILITOT 0.7 0.8  PROT 7.0 6.9  ALBUMIN 3.8 3.6   No results for input(s): LIPASE, AMYLASE in the last 168 hours. No results for input(s): AMMONIA in the last 168 hours. Coagulation Profile: Recent Labs  Lab 01/24/21 1157 01/25/21 0625  INR 1.4* 1.5*   Cardiac Enzymes: No results for input(s): CKTOTAL, CKMB, CKMBINDEX, TROPONINI in the last 168 hours. BNP (last 3 results) No results for input(s): PROBNP in the last 8760 hours. HbA1C: Recent Labs    01/24/21 1553  HGBA1C 7.6*   CBG: Recent Labs  Lab 01/26/21 1619 01/26/21 2048 01/26/21 2358 01/27/21 0336 01/27/21 0748  GLUCAP 239* 249* 165* 136* 181*   Lipid Profile: No results for input(s): CHOL, HDL, LDLCALC, TRIG, CHOLHDL, LDLDIRECT in the last 72 hours. Thyroid Function Tests: No results for input(s): TSH, T4TOTAL, FREET4, T3FREE, THYROIDAB in the last 72  hours. Anemia Panel: No results for input(s): VITAMINB12, FOLATE, FERRITIN, TIBC, IRON, RETICCTPCT in the last 72 hours. Sepsis Labs: Recent Labs  Lab 01/24/21 1157 01/24/21 1450 01/24/21 1643 01/25/21 0625  PROCALCITON  --   --   --  7.85  LATICACIDVEN 3.2* 3.0* 5.0*  --     Recent Results (from the past 240 hour(s))  Resp Panel by RT-PCR (Flu A&B, Covid) Nasopharyngeal Swab     Status: None   Collection Time: 01/24/21 11:57 AM   Specimen: Nasopharyngeal Swab; Nasopharyngeal(NP) swabs in vial transport medium  Result Value Ref Range Status   SARS Coronavirus 2 by RT PCR NEGATIVE NEGATIVE Final    Comment: (NOTE) SARS-CoV-2 target nucleic acids are NOT DETECTED.  The SARS-CoV-2 RNA is generally detectable in upper respiratory specimens during the acute phase of infection. The lowest concentration of SARS-CoV-2 viral copies this assay can detect is 138 copies/mL. A negative result does not preclude SARS-Cov-2 infection and should not be used as the sole basis for treatment or other patient management decisions. A negative result may occur with  improper specimen collection/handling, submission of specimen other than nasopharyngeal swab, presence of viral mutation(s) within the areas targeted by this assay, and inadequate number of viral copies(<138 copies/mL). A negative result must be combined with clinical observations, patient history, and epidemiological information. The expected result is Negative.  Fact Sheet for Patients:  EntrepreneurPulse.com.au  Fact Sheet for Healthcare Providers:  IncredibleEmployment.be  This test is no t yet approved or cleared by the [place] FDA and  has been authorized for detection and/or diagnosis of SARS-CoV-2 by FDA under an Emergency Use Authorization (EUA). This EUA will remain  in effect (meaning this test can be used) for the duration of the COVID-19 declaration under Section 564(b)(1) of  the Act, 21 U.S.C.section 360bbb-3(b)(1), unless the authorization is terminated  or revoked sooner.       Influenza A by PCR NEGATIVE NEGATIVE Final   Influenza B by PCR NEGATIVE NEGATIVE Final    Comment: (NOTE) The Xpert Xpress SARS-CoV-2/FLU/RSV plus assay is intended as an aid in the diagnosis of influenza from Nasopharyngeal swab specimens and should not be used as a sole basis for treatment. Nasal washings and aspirates are unacceptable for Xpert Xpress SARS-CoV-2/FLU/RSV testing.  Fact Sheet for Patients: EntrepreneurPulse.com.au  Fact Sheet for Healthcare Providers: IncredibleEmployment.be  This test is not yet approved or cleared by the [place] FDA  and has been authorized for detection and/or diagnosis of SARS-CoV-2 by FDA under an Emergency Use Authorization (EUA). This EUA will remain in effect (meaning this test can be used) for the duration of the COVID-19 declaration under Section 564(b)(1) of the Act, 21 U.S.C. section 360bbb-3(b)(1), unless the authorization is terminated or revoked.  Performed at [facility], [address] [place], [place] [address]   Blood Culture (routine x 2)     Status: None (Preliminary result)   Collection Time: 01/24/21 11:57 AM   Specimen: BLOOD  Result Value Ref Range Status   Specimen Description BLOOD BLOOD LEFT HAND  Final   Special Requests   Final    BOTTLES DRAWN AEROBIC AND ANAEROBIC Blood Culture adequate volume   Culture   Final    NO GROWTH 3 DAYS Performed at [facility], [address], [place], [place] [address]    Report Status PENDING  Incomplete  Blood Culture (routine x 2)     Status: None (Preliminary result)   Collection Time: 01/24/21 11:57 AM   Specimen: BLOOD  Result Value Ref Range Status   Specimen Description BLOOD BLOOD LEFT ARM  Final   Special Requests   Final    BOTTLES DRAWN AEROBIC AND ANAEROBIC Blood Culture adequate volume    Culture   Final    NO GROWTH 3 DAYS Performed at [facility], [address], [place], [place] [address]    Report Status PENDING  Incomplete  Urine culture     Status: Abnormal   Collection Time: 01/24/21  2:25 PM   Specimen: Urine, Random  Result Value Ref Range Status   Specimen Description   Final    URINE, RANDOM Performed at [facility], [address], [place], [place] [address]    Special Requests   Final    NONE Performed at [facility], [address] [place], [place] [address]    Culture (A)  Final    <10,000 COLONIES/mL INSIGNIFICANT GROWTH Performed at [facility], [place] [address] [place], [place] [address]    Report Status 01/26/2021 FINAL  Final  Respiratory (~20 pathogens) panel by PCR     Status: None   Collection Time: 01/24/21  4:06 PM   Specimen: Nasopharyngeal Swab; Respiratory  Result Value Ref Range Status   Adenovirus NOT DETECTED NOT DETECTED Final   Coronavirus 229E NOT DETECTED NOT DETECTED Final    Comment: (NOTE) The Coronavirus on the Respiratory Panel, DOES NOT test for the novel  Coronavirus (2019 nCoV)    Coronavirus HKU1 NOT DETECTED NOT DETECTED Final   Coronavirus NL63 NOT DETECTED NOT DETECTED Final   Coronavirus OC43 NOT DETECTED NOT DETECTED Final   Metapneumovirus NOT DETECTED NOT DETECTED Final   Rhinovirus / Enterovirus NOT DETECTED NOT DETECTED Final   Influenza A NOT DETECTED NOT DETECTED Final   Influenza B NOT DETECTED NOT DETECTED Final   Parainfluenza Virus 1 NOT DETECTED NOT DETECTED Final   Parainfluenza Virus 2 NOT DETECTED NOT DETECTED Final   Parainfluenza Virus 3 NOT DETECTED NOT DETECTED Final   Parainfluenza Virus 4 NOT DETECTED NOT DETECTED Final   Respiratory Syncytial Virus NOT DETECTED NOT DETECTED Final   Bordetella pertussis NOT DETECTED NOT DETECTED Final   Bordetella Parapertussis NOT DETECTED NOT DETECTED Final   Chlamydophila pneumoniae NOT DETECTED NOT DETECTED  Final   Mycoplasma pneumoniae NOT DETECTED NOT DETECTED Final    Comment: Performed at [facility], [address] [place], [place] [address]  MRSA PCR Screening     Status: None   Collection Time: 01/24/21  4:06 PM   Specimen: Nasal Mucosa; Nasopharyngeal  Result Value Ref Range Status   MRSA by PCR NEGATIVE NEGATIVE Final    Comment:        The GeneXpert MRSA Assay (FDA approved for NASAL specimens only), is one component of a comprehensive MRSA colonization surveillance program. It is not intended to diagnose MRSA infection nor to guide or monitor treatment for MRSA infections. Performed at [facility], [address] [place], [place] [address]          Radiology Studies: US RENAL  Result Date: 01/25/2021 CLINICAL DATA:  Acute renal insufficiency. EXAM: RENAL / URINARY TRACT ULTRASOUND COMPLETE COMPARISON:  Body CT September 16, 2017 FINDINGS: Right Kidney: Renal measurements: 10.4 x 5.3 x 5.6 cm = volume: 160 mL. Mildly increased echogenicity. No hydronephrosis visualized. Benign-appearing 7 mm cyst is seen exophytic off of the midpole region of the right kidney. Left Kidney: Renal measurements: 12.7 x 7.5 x 5.2 cm = volume: 259 mL. Mildly increased echogenicity. No mass or hydronephrosis visualized. Bladder: Appears normal for degree of bladder distention. Other: None. IMPRESSION: Mildly increased cortical echogenicity of bilateral kidneys, consistent with medical renal disease. Electronically Signed   By: Fidela Salisbury M.D.   On: 01/25/2021 19:39        Scheduled Meds: . sodium chloride   Intravenous Once  . acyclovir  400 mg Oral BID  . allopurinol  300 mg Oral Daily  . calcium carbonate  1 tablet Oral BID WC  . finasteride  5 mg Oral Daily  . insulin aspart  0-15 Units Subcutaneous Q4H  . lidocaine-prilocaine   Topical Once  . omega-3 acid ethyl esters  1 g Oral Daily  . potassium chloride  20 mEq Oral BID  . simvastatin  40 mg Oral Daily   . vitamin B-12  1,000 mcg Oral Daily   Continuous Infusions: . azithromycin 500 mg (01/26/21 1619)  . cefTRIAXone (ROCEPHIN)  IV 2 g (01/26/21 2354)     LOS: 3 days    Time spent: 19mins    Kathie Dike, MD [facility]   If 7PM-7AM, please contact night-coverage [email or web address]  01/27/2021, 10:13 AM

## 2021-01-27 NOTE — TOC Progression Note (Signed)
Transition of Care (TOC) - Progression Note    Patient Details  Name: Paul Pham. MRN: [id number] Date of Birth: 1926-06-19  Transition of Care [facility]) CM/SW Contact  Shelbie Hutching, RN Phone Number: 01/27/2021, 1:03 PM  Clinical Narrative:    PT is recommending [place], patient and family agree to home health services and are good with [facility].  Corene Cornea with [facility] has accepted [facility] referral for RN, PT, and OT.  Patient also needs 3 in1 and RW.  Equipment ordered from [facility] and will be delivered to the room before discharge.  Potential discharge tomorrow is lab work is good.  Wife will transport patient home.     Expected Discharge Plan: [place] Barriers to Discharge: Continued Medical Work up  Expected Discharge Plan and Services Expected Discharge Plan: [place] Choice: [place] arrangements for the past 2 months: Single Family Home                 DME Arranged: 3-N-1,[place] rolling DME Agency: [facility] Date DME Agency Contacted: 01/27/21 Time DME Agency Contacted: 2947 Representative spoke with at DME Agency: [place]: RN,PT,OT [place] Agency: [place] ([place]) Date [place]: 01/27/21 Time [place]: 1230 Representative spoke with at [place]: [place] ([place]) Interventions    Readmission Risk Interventions Readmission Risk Prevention Plan 01/26/2021  Transportation Screening Complete  PCP or Specialist Appt within 3-5 Days Complete  Social Work Consult for [facility] Planning/Counseling [place] Not Applicable  Medication Review Press photographer) Complete  Some recent data might be hidden

## 2021-01-27 NOTE — Care Management Important Message (Signed)
Important Message  Patient Details  Name: Paul Pham. MRN: [id number] Date of Birth: 1925-10-07   Medicare Important Message Given:  Yes     [name] 01/27/2021, 12:01 PM

## 2021-01-28 ENCOUNTER — Ambulatory Visit: Payer: TRICARE For Life (TFL) | Admitting: Nurse Practitioner

## 2021-01-28 ENCOUNTER — Ambulatory Visit: Payer: TRICARE For Life (TFL)

## 2021-01-28 ENCOUNTER — Other Ambulatory Visit: Payer: TRICARE For Life (TFL)

## 2021-01-28 DIAGNOSIS — C9200 Acute myeloblastic leukemia, not having achieved remission: Secondary | ICD-10-CM | POA: Diagnosis not present

## 2021-01-28 DIAGNOSIS — N179 Acute kidney failure, unspecified: Secondary | ICD-10-CM | POA: Diagnosis not present

## 2021-01-28 DIAGNOSIS — A419 Sepsis, unspecified organism: Secondary | ICD-10-CM | POA: Diagnosis not present

## 2021-01-28 DIAGNOSIS — J9600 Acute respiratory failure, unspecified whether with hypoxia or hypercapnia: Secondary | ICD-10-CM | POA: Diagnosis not present

## 2021-01-28 LAB — GLUCOSE, CAPILLARY
Glucose-Capillary: 125 mg/dL — ABNORMAL HIGH (ref 70–99)
Glucose-Capillary: 148 mg/dL — ABNORMAL HIGH (ref 70–99)
Glucose-Capillary: 198 mg/dL — ABNORMAL HIGH (ref 70–99)

## 2021-01-28 LAB — CBC
HCT: 23.8 % — ABNORMAL LOW (ref 39.0–52.0)
Hemoglobin: 7.7 g/dL — ABNORMAL LOW (ref 13.0–17.0)
MCH: 28.2 pg (ref 26.0–34.0)
MCHC: 32.4 g/dL (ref 30.0–36.0)
MCV: 87.2 fL (ref 80.0–100.0)
Platelets: 40 10*3/uL — ABNORMAL LOW (ref 150–400)
RBC: 2.73 MIL/uL — ABNORMAL LOW (ref 4.22–5.81)
RDW: 18 % — ABNORMAL HIGH (ref 11.5–15.5)
WBC: 76.4 10*3/uL (ref 4.0–10.5)
nRBC: 0.2 % (ref 0.0–0.2)

## 2021-01-28 LAB — BASIC METABOLIC PANEL
Anion gap: 9 (ref 5–15)
BUN: 32 mg/dL — ABNORMAL HIGH (ref 8–23)
CO2: 23 mmol/L (ref 22–32)
Calcium: 8 mg/dL — ABNORMAL LOW (ref 8.9–10.3)
Chloride: 108 mmol/L (ref 98–111)
Creatinine, Ser: 1.3 mg/dL — ABNORMAL HIGH (ref 0.61–1.24)
GFR, Estimated: 52 mL/min — ABNORMAL LOW (ref >60)
Glucose, Bld: 139 mg/dL — ABNORMAL HIGH (ref 70–99)
Potassium: 3.9 mmol/L (ref 3.5–5.1)
Sodium: 140 mmol/L (ref 135–145)

## 2021-01-28 MED ORDER — AMOXICILLIN-POT CLAVULANATE 875-125 MG PO TABS: 1.0000 | ORAL_TABLET | Freq: Two times a day (BID) | ORAL | 0 refills | Status: AC

## 2021-01-28 MED ORDER — AZITHROMYCIN 500 MG PO TABS: 500.0000 mg | ORAL_TABLET | Freq: Every day | ORAL | 0 refills | Status: AC

## 2021-01-28 MED ORDER — GUAIFENESIN ER 600 MG PO TB12: 600.0000 mg | ORAL_TABLET | Freq: Two times a day (BID) | ORAL | 2 refills | Status: AC

## 2021-01-28 MED ORDER — ALBUTEROL SULFATE HFA 108 (90 BASE) MCG/ACT IN AERS: 2.0000 | INHALATION_SPRAY | Freq: Four times a day (QID) | RESPIRATORY_TRACT | 2 refills | Status: AC | PRN

## 2021-01-28 NOTE — Progress Notes (Signed)
Pharmacy Electrolyte Monitoring Consult:  Labs:  Sodium (mmol/L)  Date Value  01/28/2021 140   Potassium (mmol/L)  Date Value  01/28/2021 3.9   Magnesium (mg/dL)  Date Value  01/26/2021 2.3   Phosphorus (mg/dL)  Date Value  01/26/2021 6.0 (H)   Calcium (mg/dL)  Date Value  01/28/2021 8.0 (L)   Albumin (g/dL)  Date Value  01/24/2021 3.6    Assessment: 87 y.o. male with medical history significant for AML on chemotherapy, hypertension, diabetes mellitus type 2 and BPH who presented to the ER from the cancer center for evaluation of fever. His renal function has improved since admission. He has been receiving oral KCl 20 mEq BID since 05/28   Plan:  continue KCL 20 meq PO bid  no further replacement at this time  f/u electrolytes with am labs   Paul Pham A 01/28/2021 7:53 AM

## 2021-01-28 NOTE — Discharge Summary (Signed)
Physician Discharge Summary  Paul Pham. UVO:[id number] DOB: 02-17-1926 DOA: 01/24/2021  PCP: Tracie Harrier, MD  Admit date: 01/24/2021 Discharge date: 01/28/2021  Admitted From: Home Disposition: Home  Recommendations for Outpatient Follow-up:  1. Follow up with PCP in 1-2 weeks 2. Please obtain BMP/CBC in one week 3. Follow-up with oncology as previously scheduled  Home Health: Home health PT, OT, RN Equipment/Devices:  Discharge Condition: Stable CODE STATUS: DNR Diet recommendation: Heart healthy  Brief/Interim Summary: 87 year old male with a history of AML, diabetes, CKD stage IIIa, admitted to the hospital with sepsis secondary to multifocal pneumonia.  He has been started on intravenous antibiotics.  Initially required BiPAP.  He has metabolic encephalopathy secondary to sepsis.  Noted to have significant anemia in the setting of AML, he was transfused 2 units of PRBC.    Discharge Diagnoses:  Principal Problem:   Sepsis ([place]) Active Problems:   Anemia in neoplastic disease   Hard of hearing   HTN (hypertension)   Type 2 diabetes mellitus with hyperglycemia, without long-term current use of insulin (HCC)   Leukocytosis   Thrombocytopenia (HCC)   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HCC)   Hypokalemia   Acute respiratory failure (HCC)   CKD (chronic kidney disease) stage 3, GFR 30-59 ml/min (HCC)   AKI (acute kidney injury) ([place])  Severe sepsis -Patient noted to have tachypnea, tachycardia on admission with source of infection being pneumonia.  Lactic acid was elevated -Blood cultures have not shown any growth -Currently on antibiotics -hemodynamics appear to be improving -Sepsis physiology appears to have resolved  Acute respiratory failure with hypoxia -Noted to have increased work of breathing on admission requiring BiPAP -He has since been weaned off of BiPAP and  was able to wean off of oxygen entirely -Currently ambulating  comfortably on room air -Pulmonology following, appreciate assistance  Community-acquired pneumonia -Noted on CT chest to have bilateral multifocal pneumonia, most severe in the right lower lobe -Treated with ceftriaxone and azithromycin -strep pneumo and Legionella antigens negative -respiratory viral panel negative -covid negative  Anemia -Likely related to AML -He has required blood transfusions in the past -Hemoglobin 6.4 on admission -He was transfused 2 units PRBC with follow-up hemoglobin 8.0 -Continue to follow and transfuse for hemoglobin less than 7  Thrombocytopenia -Secondary to AML in the setting of sepsis -he did receive chemo approx 1 week ago, which could be contributing -platelets down to 40K at present and appear to be plateauing -no signs of bleeding at present -Continue to monitor  Leukocytosis -Related to AML -Overall trending down  Acute kidney injury on chronic kidney disease stage IIIa -Baseline creatinine around 1.3 -creatinine on admission 2.0 -Creatinine currently at 1.3 -Suspect this is related to sepsis and anemia -He was provided gentle hydration -Renal ultrasound negative for hydronephrosis -Continue to hold outpatient lisinopril  Diabetes -Resume oral agents on discharge -Blood sugars remained stable in the hospital  Hypokalemia/hypomagnesemia -Replaced  AML -Followed by oncology has been receiving treatment recently -Family understands that this is not curative  Acute metabolic encephalopathy -Suspect is related to sepsis -Normally, family reports that he is alert and oriented -As sepsis was treated, confusion improved -Mental status now back to baseline  Generalized weakness -Seen by PT/OT with recommendations for home health   Discharge Instructions  Discharge Instructions    Diet - low sodium heart healthy   Complete by: As directed    Increase activity slowly   Complete by: As directed  Allergies as  of 01/28/2021   No Known Allergies     Medication List    STOP taking these medications   aspirin 81 MG chewable tablet   lisinopril 10 MG tablet Commonly known as: ZESTRIL   meloxicam 7.5 MG tablet Commonly known as: MOBIC   prochlorperazine 10 MG tablet Commonly known as: COMPAZINE     TAKE these medications   acyclovir 400 MG tablet Commonly known as: ZOVIRAX Take 1 tablet (400 mg total) by mouth 2 (two) times daily.   albuterol 108 (90 Base) MCG/ACT inhaler Commonly known as: VENTOLIN HFA Inhale 2 puffs into the lungs every 6 (six) hours as needed for wheezing or shortness of breath.   allopurinol 300 MG tablet Commonly known as: Zyloprim Take 1 tablet (300 mg total) by mouth daily. What changed: Another medication with the same name was removed. Continue taking this medication, and follow the directions you see here.   amoxicillin-clavulanate 875-125 MG tablet Commonly known as: Augmentin Take 1 tablet by mouth 2 (two) times daily for 3 days.   azithromycin 500 MG tablet Commonly known as: Zithromax Take 1 tablet (500 mg total) by mouth daily for 3 days. Take 1 tablet daily for 3 days.   CALCIUM 1200 PO Take 1,200 mg by mouth in the morning and at bedtime.   CENTRUM SILVER PO Take by mouth.   cyanocobalamin 1000 MCG tablet Take 1,000 mcg by mouth daily.   finasteride 5 MG tablet Commonly known as: PROSCAR Take 5 mg by mouth daily.   FISH OIL PO Take 1 tablet by mouth daily.   glimepiride 1 MG tablet Commonly known as: AMARYL Take 1 tablet by mouth daily after breakfast.   glucose blood test strip FreeStyle Lite Strips   guaiFENesin 600 MG 12 hr tablet Commonly known as: Mucinex Take 1 tablet (600 mg total) by mouth 2 (two) times daily.   Lancets Ultra Fine Misc Lancets,Ultra Thin 26 gauge   lidocaine-prilocaine cream Commonly known as: EMLA Apply to affected area once   loperamide 2 MG capsule Commonly known as: IMODIUM Take 1 capsule  (2 mg total) by mouth See admin instructions. With onset of loose stool, take 4mg  followed by 2mg  every 2 hours until loose bowel movement stopped. Maximum: 16 mg/day   meclizine 25 MG tablet Commonly known as: ANTIVERT Take 25 mg by mouth 2 (two) times daily as needed.   metformin 1000 MG (OSM) 24 hr tablet Commonly known as: FORTAMET Take 1,000 mg by mouth 2 (two) times daily with a meal.   MIRALAX PO Take by mouth as needed.   mupirocin ointment 2 % Commonly known as: BACTROBAN Apply 1 application topically daily. With dressing changes   ondansetron 4 MG tablet Commonly known as: ZOFRAN Take 1 tablet (4 mg total) by mouth every 6 (six) hours as needed for nausea or vomiting.   ondansetron 8 MG tablet Commonly known as: Zofran Take 1 tablet (8 mg total) by mouth 2 (two) times daily as needed (Nausea or vomiting).   PROSTATE SR PO Take 1 tablet by mouth daily.   simvastatin 40 MG tablet Commonly known as: ZOCOR Take 40 mg by mouth daily.   THERACRAN PO Take 1 tablet by mouth daily.       Follow-up Information    Sindy Guadeloupe, MD.   Specialty: Oncology Why: follow up in 1 week for repeat blood test (CBC) Contact information: [place] [address] [phone]  Tracie Harrier, MD. Schedule an appointment as soon as possible for a visit on 01/31/2021.   Specialty: Internal Medicine Why: @11 :30am Contact information: [address] [place] [address] [phone]        Lequita Asal, MD.   Specialty: Hematology and Oncology Contact information: [address] [place] [address] [phone]              No Known Allergies  Consultations:     Procedures/Studies: CT CHEST WO CONTRAST  Result Date: 01/24/2021 CLINICAL DATA:  87 year old with cough and dyspnea. Acute myeloid leukemia. EXAM: CT CHEST WITHOUT CONTRAST TECHNIQUE: Multidetector CT imaging of the chest was  performed following the standard protocol without IV contrast. COMPARISON:  CT abdomen 09/16/2017 FINDINGS: Cardiovascular: Normal caliber of the thoracic aorta with diffuse calcifications. Coronary arteries are heavily calcified. Minimal pericardial fluid. Heart size is within normal limits. Mediastinum/Nodes: No significant mediastinal lymphadenopathy. Largest lymph node measures 1.0 cm in the short axis in the precarinal region on sequence 2 image 68. Limited evaluation for hilar lymph node enlargement without intravascular contrast. Moderate to large sized hiatal hernia with contrast in the lumen. Small right supraclavicular irregular lymph nodes are nonspecific. Small axillary lymph nodes bilaterally. Lungs/Pleura: Trachea is patent. Large amount of airspace disease in the right lower lobe. Patchy airspace densities in the left upper lobe and left lower lobe. There is minimal disease in the right middle lobe and right upper lobe. Findings are most compatible with pneumonia. No significant pleural fluid. Upper Abdomen: Splenomegaly. Musculoskeletal: No acute bone abnormality. IMPRESSION: 1. Bilateral multifocal pneumonia. The disease is most severe in the right lower lobe. 2. Splenomegaly. This is likely associated with patient's acute myeloid leukemia. 3. Moderate to large sized hiatal hernia. 4.  Aortic Atherosclerosis (ICD10-I70.0). Electronically Signed   By: Markus Daft M.D.   On: 01/24/2021 16:07   US RENAL  Result Date: 01/25/2021 CLINICAL DATA:  Acute renal insufficiency. EXAM: RENAL / URINARY TRACT ULTRASOUND COMPLETE COMPARISON:  Body CT September 16, 2017 FINDINGS: Right Kidney: Renal measurements: 10.4 x 5.3 x 5.6 cm = volume: 160 mL. Mildly increased echogenicity. No hydronephrosis visualized. Benign-appearing 7 mm cyst is seen exophytic off of the midpole region of the right kidney. Left Kidney: Renal measurements: 12.7 x 7.5 x 5.2 cm = volume: 259 mL. Mildly increased echogenicity. No mass or  hydronephrosis visualized. Bladder: Appears normal for degree of bladder distention. Other: None. IMPRESSION: Mildly increased cortical echogenicity of bilateral kidneys, consistent with medical renal disease. Electronically Signed   By: Fidela Salisbury M.D.   On: 01/25/2021 19:39   DG Chest Port 1 View  Result Date: 01/24/2021 CLINICAL DATA:  Sepsis. EXAM: PORTABLE CHEST 1 VIEW COMPARISON:  November 08, 2006. FINDINGS: The heart size and mediastinal contours are within normal limits. Mild bibasilar subsegmental atelectasis is noted. The visualized skeletal structures are unremarkable. IMPRESSION: Mild bibasilar subsegmental atelectasis. Aortic Atherosclerosis (ICD10-I70.0). Electronically Signed   By: Marijo Conception M.D.   On: 01/24/2021 12:24   ECHOCARDIOGRAM COMPLETE  Result Date: 01/25/2021    ECHOCARDIOGRAM REPORT   Patient Name:   Justino Bo[place]. Date of Exam: 01/25/2021 Medical Rec #:  [id number]        Height:       73.0 in Accession #:    [id number]       Weight:       166.9 lb Date of Birth:  06/16/1926        BSA:  1.992 m Patient Age:    89 years         BP:           104/40 mmHg Patient Gender: M                HR:           97 bpm. Exam Location:  [facility] Procedure: 2D Echo Indications:     CHF I50.21  History:         Patient has no prior history of Echocardiogram examinations.  Sonographer:     Kathlen Brunswick RDCS Referring Phys:  [id number] Ottie Glazier Diagnosing Phys: Ida Rogue MD  Sonographer Comments: Technically difficult study due to poor echo windows. Image acquisition challenging due to uncooperative patient. IMPRESSIONS  1. Left ventricular ejection fraction, by estimation, is 55 to 60%. The left ventricle has normal function. The left ventricle has no regional wall motion abnormalities. Left ventricular diastolic parameters are consistent with Grade II diastolic dysfunction (pseudonormalization).  2. Right ventricular systolic function is normal. The right ventricular  size is normal. There is moderately elevated pulmonary artery systolic pressure. The estimated right ventricular systolic pressure is 41.3 mmHg.  3. Left atrial size was mildly dilated.  4. Tricuspid valve regurgitation is mild to moderate.  5. The inferior vena cava is dilated in size with <50% respiratory variability, suggesting right atrial pressure of 15 mmHg. FINDINGS  Left Ventricle: Left ventricular ejection fraction, by estimation, is 55 to 60%. The left ventricle has normal function. The left ventricle has no regional wall motion abnormalities. The left ventricular internal cavity size was normal in size. There is  no left ventricular hypertrophy. Left ventricular diastolic parameters are consistent with Grade II diastolic dysfunction (pseudonormalization). Right Ventricle: The right ventricular size is normal. No increase in right ventricular wall thickness. Right ventricular systolic function is normal. There is moderately elevated pulmonary artery systolic pressure. The tricuspid regurgitant velocity is 2.97 m/s, and with an assumed right atrial pressure of 15 mmHg, the estimated right ventricular systolic pressure is 24.4 mmHg. Left Atrium: Left atrial size was mildly dilated. Right Atrium: Right atrial size was normal in size. Pericardium: There is no evidence of pericardial effusion. Mitral Valve: The mitral valve is normal in structure. No evidence of mitral valve regurgitation. No evidence of mitral valve stenosis. Tricuspid Valve: The tricuspid valve is normal in structure. Tricuspid valve regurgitation is mild to moderate. No evidence of tricuspid stenosis. Aortic Valve: The aortic valve is normal in structure. Aortic valve regurgitation is not visualized. Aortic regurgitation PHT measures 520 msec. No aortic stenosis is present. Aortic valve peak gradient measures 8.8 mmHg. Pulmonic Valve: The pulmonic valve was normal in structure. Pulmonic valve regurgitation is not visualized. No evidence of  pulmonic stenosis. Aorta: The aortic root is normal in size and structure. Venous: The inferior vena cava is dilated in size with less than 50% respiratory variability, suggesting right atrial pressure of 15 mmHg. IAS/Shunts: No atrial level shunt detected by color flow Doppler.  LEFT VENTRICLE PLAX 2D LVIDd:         3.98 cm Diastology LVIDs:         2.72 cm LV e' medial:    7.18 cm/s LV PW:         1.31 cm LV E/e' medial:  11.3 LV IVS:        1.34 cm LV e' lateral:   9.46 cm/s  LV E/e' lateral: 8.6  RIGHT VENTRICLE RV Basal diam:  3.14 cm RV S prime:     18.30 cm/s TAPSE (M-mode): 2.2 cm LEFT ATRIUM         Index LA diam:    5.10 cm 2.56 cm/m  AORTIC VALVE              PULMONIC VALVE AV Vmax:      148.00 cm/s PV Vmax:       1.02 m/s AV Peak Grad: 8.8 mmHg    PV Peak grad:  4.2 mmHg LVOT Vmax:    91.40 cm/s LVOT Vmean:   57.400 cm/s LVOT VTI:     0.204 m AI PHT:       520 msec MITRAL VALVE               TRICUSPID VALVE MV Area (PHT): 5.16 cm    TV Peak grad:   32.1 mmHg MV Decel Time: 147 msec    TV Vmax:        2.83 m/s MV E velocity: 81.40 cm/s  TR Peak grad:   35.3 mmHg MV A velocity: 73.30 cm/s  TR Vmax:        297.00 cm/s MV E/A ratio:  1.11                            SHUNTS                            Systemic VTI: 0.20 m Ida Rogue MD Electronically signed by Ida Rogue MD Signature Date/Time: 01/25/2021/12:51:54 PM    Final        Subjective: He is feeling better.  Cough is less productive.  Shortness of breath is better.  Discharge Exam: Vitals:   01/27/21 1945 01/28/21 0005 01/28/21 0426 01/28/21 0839  BP: (!) 112/34 (!) 137/47 (!) 128/52 (!) 153/65  Pulse: 79 80 85 99  Resp: 16 16 16 16   Temp: 98.2 F (36.8 C) 98 F (36.7 C) 97.9 F (36.6 C) 98.3 F (36.8 C)  TempSrc: Oral Oral Oral Oral  SpO2: 96% 95% 91% 95%  Weight:      Height:        General: Pt is alert, awake, not in acute distress Cardiovascular: RRR, S1/S2 +, no rubs, no  gallops Respiratory: CTA bilaterally, no wheezing, no rhonchi Abdominal: Soft, NT, ND, bowel sounds + Extremities: no edema, no cyanosis    The results of significant diagnostics from this hospitalization (including imaging, microbiology, ancillary and laboratory) are listed below for reference.     Microbiology: Recent Results (from the past 240 hour(s))  Resp Panel by RT-PCR (Flu A&B, Covid) Nasopharyngeal Swab     Status: None   Collection Time: 01/24/21 11:57 AM   Specimen: Nasopharyngeal Swab; Nasopharyngeal(NP) swabs in vial transport medium  Result Value Ref Range Status   SARS Coronavirus 2 by RT PCR NEGATIVE NEGATIVE Final    Comment: (NOTE) SARS-CoV-2 target nucleic acids are NOT DETECTED.  The SARS-CoV-2 RNA is generally detectable in upper respiratory specimens during the acute phase of infection. The lowest concentration of SARS-CoV-2 viral copies this assay can detect is 138 copies/mL. A negative result does not preclude SARS-Cov-2 infection and should not be used as the sole basis for treatment or other patient management decisions. A negative result may occur with  improper specimen collection/handling, submission of specimen other than nasopharyngeal  swab, presence of viral mutation(s) within the areas targeted by this assay, and inadequate number of viral copies(<138 copies/mL). A negative result must be combined with clinical observations, patient history, and epidemiological information. The expected result is Negative.  Fact Sheet for Patients:  EntrepreneurPulse.com.au  Fact Sheet for Healthcare Providers:  IncredibleEmployment.be  This test is no t yet approved or cleared by the [place] FDA and  has been authorized for detection and/or diagnosis of SARS-CoV-2 by FDA under an Emergency Use Authorization (EUA). This EUA will remain  in effect (meaning this test can be used) for the duration of the COVID-19  declaration under Section 564(b)(1) of the Act, 21 U.S.C.section 360bbb-3(b)(1), unless the authorization is terminated  or revoked sooner.       Influenza A by PCR NEGATIVE NEGATIVE Final   Influenza B by PCR NEGATIVE NEGATIVE Final    Comment: (NOTE) The Xpert Xpress SARS-CoV-2/FLU/RSV plus assay is intended as an aid in the diagnosis of influenza from Nasopharyngeal swab specimens and should not be used as a sole basis for treatment. Nasal washings and aspirates are unacceptable for Xpert Xpress SARS-CoV-2/FLU/RSV testing.  Fact Sheet for Patients: EntrepreneurPulse.com.au  Fact Sheet for Healthcare Providers: IncredibleEmployment.be  This test is not yet approved or cleared by the [place] FDA and has been authorized for detection and/or diagnosis of SARS-CoV-2 by FDA under an Emergency Use Authorization (EUA). This EUA will remain in effect (meaning this test can be used) for the duration of the COVID-19 declaration under Section 564(b)(1) of the Act, 21 U.S.C. section 360bbb-3(b)(1), unless the authorization is terminated or revoked.  Performed at [facility], [address] [place], [place] [address]   Blood Culture (routine x 2)     Status: None (Preliminary result)   Collection Time: 01/24/21 11:57 AM   Specimen: BLOOD  Result Value Ref Range Status   Specimen Description BLOOD BLOOD LEFT HAND  Final   Special Requests   Final    BOTTLES DRAWN AEROBIC AND ANAEROBIC Blood Culture adequate volume   Culture   Final    NO GROWTH 4 DAYS Performed at [facility], [address] [place], [place] [address]    Report Status PENDING  Incomplete  Blood Culture (routine x 2)     Status: None (Preliminary result)   Collection Time: 01/24/21 11:57 AM   Specimen: BLOOD  Result Value Ref Range Status   Specimen Description BLOOD BLOOD LEFT ARM  Final   Special Requests   Final    BOTTLES DRAWN AEROBIC AND  ANAEROBIC Blood Culture adequate volume   Culture   Final    NO GROWTH 4 DAYS Performed at [facility], [address], [place], [place] [address]    Report Status PENDING  Incomplete  Urine culture     Status: Abnormal   Collection Time: 01/24/21  2:25 PM   Specimen: Urine, Random  Result Value Ref Range Status   Specimen Description   Final    URINE, RANDOM Performed at [facility], [address] [place], [place] [address]    Special Requests   Final    NONE Performed at [facility], [address] [place],  [address]    Culture (A)  Final    <10,000 COLONIES/mL INSIGNIFICANT GROWTH Performed at [facility], [place] [address], [place],  [address]    Report Status 01/26/2021 FINAL  Final  Respiratory (~20 pathogens) panel by PCR  Status: None   Collection Time: 01/24/21  4:06 PM   Specimen: Nasopharyngeal Swab; Respiratory  Result Value Ref Range Status   Adenovirus NOT DETECTED NOT DETECTED Final   Coronavirus 229E NOT DETECTED NOT DETECTED Final    Comment: (NOTE) The Coronavirus on the Respiratory Panel, DOES NOT test for the novel  Coronavirus (2019 nCoV)    Coronavirus HKU1 NOT DETECTED NOT DETECTED Final   Coronavirus NL63 NOT DETECTED NOT DETECTED Final   Coronavirus OC43 NOT DETECTED NOT DETECTED Final   Metapneumovirus NOT DETECTED NOT DETECTED Final   Rhinovirus / Enterovirus NOT DETECTED NOT DETECTED Final   Influenza A NOT DETECTED NOT DETECTED Final   Influenza B NOT DETECTED NOT DETECTED Final   Parainfluenza Virus 1 NOT DETECTED NOT DETECTED Final   Parainfluenza Virus 2 NOT DETECTED NOT DETECTED Final   Parainfluenza Virus 3 NOT DETECTED NOT DETECTED Final   Parainfluenza Virus 4 NOT DETECTED NOT DETECTED Final   Respiratory Syncytial Virus NOT DETECTED NOT DETECTED Final   Bordetella pertussis NOT DETECTED NOT DETECTED Final   Bordetella Parapertussis NOT DETECTED NOT DETECTED Final    Chlamydophila pneumoniae NOT DETECTED NOT DETECTED Final   Mycoplasma pneumoniae NOT DETECTED NOT DETECTED Final    Comment: Performed at [facility], [place]. [address] [place], [place] [address]  MRSA PCR Screening     Status: None   Collection Time: 01/24/21  4:06 PM   Specimen: Nasal Mucosa; Nasopharyngeal  Result Value Ref Range Status   MRSA by PCR NEGATIVE NEGATIVE Final    Comment:        The GeneXpert MRSA Assay (FDA approved for NASAL specimens only), is one component of a comprehensive MRSA colonization surveillance program. It is not intended to diagnose MRSA infection nor to guide or monitor treatment for MRSA infections. Performed at [facility], [address] [name], [place] [address]      Labs: BNP (last 3 results) No results for input(s): BNP in the last 8760 hours. Basic Metabolic Panel: Recent Labs  Lab 01/24/21 1157 01/24/21 1644 01/25/21 0625 01/26/21 0416 01/27/21 0322 01/28/21 0357  NA 135  --  135 138 139 140  K 2.2* 2.9* 4.3 4.0 4.0 3.9  CL 99  --  102 104 104 108  CO2 22  --  23 23 23 23   GLUCOSE 248*  --  215* 180* 140* 139*  BUN 19  --  30* 40* 41* 32*  CREATININE 1.59*  --  2.01* 1.94* 1.62* 1.30*  CALCIUM 8.8*  --  8.1* 8.2* 7.8* 8.0*  MG  --  1.6* 2.0 2.3  --   --   PHOS  --  3.1 4.1 6.0*  --   --    Liver Function Tests: Recent Labs  Lab 01/22/21 0906 01/24/21 1157  AST 48* 50*  ALT 41 40  ALKPHOS 51 53  BILITOT 0.7 0.8  PROT 7.0 6.9  ALBUMIN 3.8 3.6   No results for input(s): LIPASE, AMYLASE in the last 168 hours. No results for input(s): AMMONIA in the last 168 hours. CBC: Recent Labs  Lab 01/22/21 0906 01/24/21 0849 01/24/21 1157 01/25/21 0135 01/25/21 0625 01/25/21 0833 01/26/21 0416 01/27/21 0322 01/28/21 0357  WBC 159.5* 155.0* 181.6*  --  167.7*  --  125.1* 75.7* 76.4*  NEUTROABS 70.6* 97.7* 86.8*  --   --   --   --   --   --   HGB 7.4* 6.8* 6.4*   < >  7.6* 8.0* 7.8* 7.8* 7.7*  HCT 23.6*  21.3* 19.7*   < > 22.8* 23.7* 23.8* 23.3* 23.8*  MCV 87.7 88.0 87.6  --  84.4  --  85.0 85.0 87.2  PLT 81* 71* 72*  --  77*  --  65* 49* 40*   < > = values in this interval not displayed.   Cardiac Enzymes: No results for input(s): CKTOTAL, CKMB, CKMBINDEX, TROPONINI in the last 168 hours. BNP: Invalid input(s): POCBNP CBG: Recent Labs  Lab 01/27/21 1605 01/27/21 1946 01/28/21 0014 01/28/21 0426 01/28/21 0848  GLUCAP 188* 205* 125* 148* 198*   D-Dimer No results for input(s): DDIMER in the last 72 hours. Hgb A1c No results for input(s): HGBA1C in the last 72 hours. Lipid Profile No results for input(s): CHOL, HDL, LDLCALC, TRIG, CHOLHDL, LDLDIRECT in the last 72 hours. Thyroid function studies No results for input(s): TSH, T4TOTAL, T3FREE, THYROIDAB in the last 72 hours.  Invalid input(s): FREET3 Anemia work up No results for input(s): VITAMINB12, FOLATE, FERRITIN, TIBC, IRON, RETICCTPCT in the last 72 hours. Urinalysis    Component Value Date/Time   COLORURINE YELLOW (A) 01/24/2021 1425   APPEARANCEUR CLOUDY (A) 01/24/2021 1425   LABSPEC 1.018 01/24/2021 1425   PHURINE 5.0 01/24/2021 1425   GLUCOSEU >=500 (A) 01/24/2021 1425   HGBUR SMALL (A) 01/24/2021 1425   BILIRUBINUR NEGATIVE 01/24/2021 [place] 01/24/2021 1425   PROTEINUR 100 (A) 01/24/2021 1425   NITRITE NEGATIVE 01/24/2021 1425   LEUKOCYTESUR NEGATIVE 01/24/2021 1425   [facility] Invalid input(s): PROCALCITONIN,  WBC,  LACTICIDVEN Microbiology Recent Results (from the past 240 hour(s))  Resp Panel by RT-PCR (Flu A&B, Covid) Nasopharyngeal Swab     Status: None   Collection Time: 01/24/21 11:57 AM   Specimen: Nasopharyngeal Swab; Nasopharyngeal(NP) swabs in vial transport medium  Result Value Ref Range Status   SARS Coronavirus 2 by RT PCR NEGATIVE NEGATIVE Final    Comment: (NOTE) SARS-CoV-2 target nucleic acids are NOT DETECTED.  The SARS-CoV-2 RNA is generally detectable in upper  respiratory specimens during the acute phase of infection. The lowest concentration of SARS-CoV-2 viral copies this assay can detect is 138 copies/mL. A negative result does not preclude SARS-Cov-2 infection and should not be used as the sole basis for treatment or other patient management decisions. A negative result may occur with  improper specimen collection/handling, submission of specimen other than nasopharyngeal swab, presence of viral mutation(s) within the areas targeted by this assay, and inadequate number of viral copies(<138 copies/mL). A negative result must be combined with clinical observations, patient history, and epidemiological information. The expected result is Negative.  Fact Sheet for Patients:  EntrepreneurPulse.com.au  Fact Sheet for Healthcare Providers:  IncredibleEmployment.be  This test is no t yet approved or cleared by the [place] FDA and  has been authorized for detection and/or diagnosis of SARS-CoV-2 by FDA under an Emergency Use Authorization (EUA). This EUA will remain  in effect (meaning this test can be used) for the duration of the COVID-19 declaration under Section 564(b)(1) of the Act, 21 U.S.C.section 360bbb-3(b)(1), unless the authorization is terminated  or revoked sooner.       Influenza A by PCR NEGATIVE NEGATIVE Final   Influenza B by PCR NEGATIVE NEGATIVE Final    Comment: (NOTE) The Xpert Xpress SARS-CoV-2/FLU/RSV plus assay is intended as an aid in the diagnosis of influenza from Nasopharyngeal swab specimens and should not be used as a sole basis for treatment. Nasal  washings and aspirates are unacceptable for Xpert Xpress SARS-CoV-2/FLU/RSV testing.  Fact Sheet for Patients: EntrepreneurPulse.com.au  Fact Sheet for Healthcare Providers: IncredibleEmployment.be  This test is not yet approved or cleared by the [place] FDA and has been  authorized for detection and/or diagnosis of SARS-CoV-2 by FDA under an Emergency Use Authorization (EUA). This EUA will remain in effect (meaning this test can be used) for the duration of the COVID-19 declaration under Section 564(b)(1) of the Act, 21 U.S.C. section 360bbb-3(b)(1), unless the authorization is terminated or revoked.  Performed at [facility], [address] [place], [place] [address]   Blood Culture (routine x 2)     Status: None (Preliminary result)   Collection Time: 01/24/21 11:57 AM   Specimen: BLOOD  Result Value Ref Range Status   Specimen Description BLOOD BLOOD LEFT HAND  Final   Special Requests   Final    BOTTLES DRAWN AEROBIC AND ANAEROBIC Blood Culture adequate volume   Culture   Final    NO GROWTH 4 DAYS Performed at [facility], [address] [place], [place] [address]    Report Status PENDING  Incomplete  Blood Culture (routine x 2)     Status: None (Preliminary result)   Collection Time: 01/24/21 11:57 AM   Specimen: BLOOD  Result Value Ref Range Status   Specimen Description BLOOD BLOOD LEFT ARM  Final   Special Requests   Final    BOTTLES DRAWN AEROBIC AND ANAEROBIC Blood Culture adequate volume   Culture   Final    NO GROWTH 4 DAYS Performed at [facility], [address] [place], [place] [address]    Report Status PENDING  Incomplete  Urine culture     Status: Abnormal   Collection Time: 01/24/21  2:25 PM   Specimen: Urine, Random  Result Value Ref Range Status   Specimen Description   Final    URINE, RANDOM Performed at [facility], [address], [place], [place] [address]    Special Requests   Final    NONE Performed at [facility], [address] [place], [place] [address]    Culture (A)  Final    <10,000 COLONIES/mL INSIGNIFICANT GROWTH Performed at [facility], [place] [address], [place], [place] [address]    Report Status 01/26/2021 FINAL  Final  Respiratory  (~20 pathogens) panel by PCR     Status: None   Collection Time: 01/24/21  4:06 PM   Specimen: Nasopharyngeal Swab; Respiratory  Result Value Ref Range Status   Adenovirus NOT DETECTED NOT DETECTED Final   Coronavirus 229E NOT DETECTED NOT DETECTED Final    Comment: (NOTE) The Coronavirus on the Respiratory Panel, DOES NOT test for the novel  Coronavirus (2019 nCoV)    Coronavirus HKU1 NOT DETECTED NOT DETECTED Final   Coronavirus NL63 NOT DETECTED NOT DETECTED Final   Coronavirus OC43 NOT DETECTED NOT DETECTED Final   Metapneumovirus NOT DETECTED NOT DETECTED Final   Rhinovirus / Enterovirus NOT DETECTED NOT DETECTED Final   Influenza A NOT DETECTED NOT DETECTED Final   Influenza B NOT DETECTED NOT DETECTED Final   Parainfluenza Virus 1 NOT DETECTED NOT DETECTED Final   Parainfluenza Virus 2 NOT DETECTED NOT DETECTED Final   Parainfluenza Virus 3 NOT DETECTED NOT DETECTED Final   Parainfluenza Virus 4 NOT DETECTED NOT DETECTED Final   Respiratory Syncytial Virus NOT DETECTED NOT DETECTED Final   Bordetella pertussis NOT DETECTED NOT DETECTED Final  Bordetella Parapertussis NOT DETECTED NOT DETECTED Final   Chlamydophila pneumoniae NOT DETECTED NOT DETECTED Final   Mycoplasma pneumoniae NOT DETECTED NOT DETECTED Final    Comment: Performed at [facility], [place] [address], [place], [place] [address]  MRSA PCR Screening     Status: None   Collection Time: 01/24/21  4:06 PM   Specimen: Nasal Mucosa; Nasopharyngeal  Result Value Ref Range Status   MRSA by PCR NEGATIVE NEGATIVE Final    Comment:        The GeneXpert MRSA Assay (FDA approved for NASAL specimens only), is one component of a comprehensive MRSA colonization surveillance program. It is not intended to diagnose MRSA infection nor to guide or monitor treatment for MRSA infections. Performed at [facility], [address] [place], [place] [address]      Time coordinating discharge:  88mins  SIGNED:   Kathie Dike, MD  [facility] 01/28/2021, 7:07 PM   If 7PM-7AM, please contact night-coverage [email or web address]

## 2021-01-29 ENCOUNTER — Ambulatory Visit: Payer: TRICARE For Life (TFL)

## 2021-01-29 LAB — CULTURE, BLOOD (ROUTINE X 2)
Culture: NO GROWTH
Culture: NO GROWTH
Special Requests: ADEQUATE
Special Requests: ADEQUATE

## 2021-01-30 ENCOUNTER — Telehealth: Payer: Self-pay | Admitting: Hospice and Palliative Medicine

## 2021-01-30 ENCOUNTER — Ambulatory Visit: Payer: TRICARE For Life (TFL)

## 2021-01-30 NOTE — Telephone Encounter (Signed)
Paul Pham's request, I called in an order for hospice to [facility].

## 2021-01-31 ENCOUNTER — Ambulatory Visit: Payer: TRICARE For Life (TFL)

## 2021-01-31 ENCOUNTER — Other Ambulatory Visit: Payer: TRICARE For Life (TFL)

## 2021-01-31 LAB — ASPERGILLUS ANTIGEN, BAL/SERUM: Aspergillus Ag, BAL/Serum: 0.03 Index (ref 0.00–0.49)

## 2021-02-01 ENCOUNTER — Encounter: Payer: Self-pay | Admitting: Oncology

## 2021-02-02 NOTE — Progress Notes (Deleted)
[facility] CONSULT NOTE  Patient Care Team: Tracie Harrier, MD as PCP - General (Internal Medicine) Lequita Asal, MD as PCP - Hematology/Oncology (Oncology)  CHIEF COMPLAINTS/PURPOSE OF CONSULTATION: AML  #  Oncology History Overview Note  Patient was seen during his most recent admission on 08/18/2017. Patient is hard of hearing. He has been feeling increased fatigue, dyspnea, and hemoglobin has dropped from baseline 10 to 7 within the past couple of months. Last colonoscopy was done 10 years ago. Outpatient stool occult test is negative. Patient also takes iron pills and stool is always black.  # He has had extensive workup including urinalysis negative for hematuria, normal H08 and folic level. Normal TSH, negative hemolysis workup. Her stool occult  is being negative as well. 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 Patient and his family members understand that this condition is not curable.   Treatment:  Azacitidine 75 mg/m2  Day 1-5 of every 28 days cycle.  Cycle 1 3/7-3/8, 3/11-13 cycle 2  12/06/17- 12/10/17, day 1-5 Cycle 3 plan 5/6 -5/10. Cycle 4 6/3-6/7 Cycle 5. Plan for 7/8-7/12 d/t holiday   Aranesp 133mcg 3/14 and 3/28 351mcg 5/10, 5/24, 5/31, 6/14 and 6/21     MDS (myelodysplastic syndrome) ([place])  10/05/2017 Initial Diagnosis   MDS (myelodysplastic syndrome) ([place])   01/20/2021 -  Chemotherapy    Patient is on Treatment Plan: LEUKEMIA AML DECITABINE Q28D      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[place])  12/27/2020 Initial Diagnosis   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[place])   01/20/2021 -  Chemotherapy    Patient is on Treatment Plan: LEUKEMIA AML DECITABINE Q28D         HISTORY OF PRESENTING ILLNESS:   Paul Pham. 87 y.o.  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  The clinic patient complains of worsening nausea and vomiting-since yesterday.  Feels weak.  Has had cough for the last several days.  No fevers.  Complains of chills.  No abdominal pain.  Mild diarrhea.  Intermittent headaches.  Patient denies any chest pain.  Overall he feels weak.   Review of Systems  Constitutional: Positive for weight loss. Negative for chills, diaphoresis and malaise/fatigue.  HENT: Negative for nosebleeds and sore throat.   Eyes: Negative for double vision.  Respiratory: Positive for cough. Negative for hemoptysis, sputum production and wheezing.   Cardiovascular: Negative for chest pain, palpitations, orthopnea and leg swelling.  Gastrointestinal: Positive for diarrhea, nausea and vomiting. Negative for abdominal pain, blood in stool, constipation, heartburn and melena.  Genitourinary: Negative for dysuria, frequency and urgency.  Musculoskeletal: Negative for back pain and joint pain.  Skin: Negative.  Negative for itching and rash.  Neurological: Positive for headaches. Negative for dizziness, tingling, focal weakness and weakness.  Endo/Heme/Allergies: Does not bruise/bleed easily.  Psychiatric/Behavioral: Negative for depression. The patient is not nervous/anxious and does not have insomnia.      MEDICAL HISTORY:  Past Medical History:  Diagnosis Date  . Anemia   . Arthritis   . BPH (benign prostatic hypertrophy)   . Cancer ([place])   . Complication of anesthesia    nausea  . Diabetes type 2, controlled ([place])   . HOH (  hard of hearing)    r and L ears-70% loss per pt  . Hyperlipidemia   . Hypertension   . Myelodysplasia (myelodysplastic syndrome) ([place]) 2019    SURGICAL HISTORY: Past Surgical History:  Procedure Laterality Date  . APPENDECTOMY    . COLONOSCOPY  09/21/08   1 polyp found, tubular adenoma  .  HAND SURGERY    . KNEE SURGERY Left     SOCIAL HISTORY: Social History   Socioeconomic History  . Marital status: Married    Spouse name: Not on file  . Number of children: Not on file  . Years of education: Not on file  . Highest education level: Not on file  Occupational History  . Not on file  Tobacco Use  . Smoking status: Never Smoker  . Smokeless tobacco: Never Used  Vaping Use  . Vaping Use: Never used  Substance and Sexual Activity  . Alcohol use: No    Alcohol/week: 0.0 standard drinks  . Drug use: No  . Sexual activity: Not on file  Other Topics Concern  . Not on file  Social History Narrative   Lives in private residence with wife   Social Determinants of Health   Financial Resource Strain: Not on file  Food Insecurity: Not on file  Transportation Needs: Not on file  Physical Activity: Not on file  Stress: Not on file  Social Connections: Not on file  Intimate Partner Violence: Not on file    FAMILY HISTORY: Family History  Problem Relation Age of Onset  . Aneurysm Mother   . Heart disease Father   . Cancer Brother        skin  . Heart disease Brother   . Heart disease Brother   . Cancer Sister        skin  . Aneurysm Brother   . Heart disease Brother   . Heart disease Sister   . Heart disease Sister   . COPD Sister   . Diabetes Sister     ALLERGIES:  has No Known Allergies.  MEDICATIONS:  Current Outpatient Medications  Medication Sig Dispense Refill  . acyclovir (ZOVIRAX) 400 MG tablet Take 1 tablet (400 mg total) by mouth 2 (two) times daily. 60 tablet 3  . albuterol (VENTOLIN HFA) 108 (90 Base) MCG/ACT inhaler Inhale 2 puffs into the lungs every 6 (six) hours as needed for wheezing or shortness of breath. 8 g 2  . allopurinol (ZYLOPRIM) 300 MG tablet Take 1 tablet (300 mg total) by mouth daily. 30 tablet 2  . Calcium Carbonate-Vit D-Min (CALCIUM 1200 PO) Take 1,200 mg by mouth in the morning and at bedtime.    . Cranberry (THERACRAN  PO) Take 1 tablet by mouth daily.     . cyanocobalamin 1000 MCG tablet Take 1,000 mcg by mouth daily.    . finasteride (PROSCAR) 5 MG tablet Take 5 mg by mouth daily.    Marland Kitchen glimepiride (AMARYL) 1 MG tablet Take 1 tablet by mouth daily after breakfast.    . glucose blood test strip FreeStyle Lite Strips    . guaiFENesin (MUCINEX) 600 MG 12 hr tablet Take 1 tablet (600 mg total) by mouth 2 (two) times daily. 60 tablet 2  . LANCETS ULTRA FINE MISC Lancets,Ultra Thin 26 gauge    . lidocaine-prilocaine (EMLA) cream Apply to affected area once 30 g 3  . loperamide (IMODIUM) 2 MG capsule Take 1 capsule (2 mg total) by mouth See admin instructions. With onset of loose stool,  take 4mg  followed by 2mg  every 2 hours until loose bowel movement stopped. Maximum: 16 mg/day (Patient not taking: Reported on 01/24/2021) 30 capsule 0  . meclizine (ANTIVERT) 25 MG tablet Take 25 mg by mouth 2 (two) times daily as needed. (Patient not taking: Reported on 01/24/2021)    . metformin (FORTAMET) 1000 MG (OSM) 24 hr tablet Take 1,000 mg by mouth 2 (two) times daily with a meal. (Patient not taking: Reported on 01/24/2021)    . Multiple Vitamins-Minerals (CENTRUM SILVER PO) Take by mouth.    . mupirocin ointment (BACTROBAN) 2 % Apply 1 application topically daily. With dressing changes (Patient not taking: Reported on 01/24/2021) 22 g 0  . Omega-3 Fatty Acids (FISH OIL PO) Take 1 tablet by mouth daily.     . ondansetron (ZOFRAN) 4 MG tablet Take 1 tablet (4 mg total) by mouth every 6 (six) hours as needed for nausea or vomiting. (Patient not taking: Reported on 01/24/2021) 30 tablet 2  . ondansetron (ZOFRAN) 8 MG tablet Take 1 tablet (8 mg total) by mouth 2 (two) times daily as needed (Nausea or vomiting). 30 tablet 1  . Polyethylene Glycol 3350 (MIRALAX PO) Take by mouth as needed.  (Patient not taking: Reported on 01/24/2021)    . Saw Palmetto-Phytosterols (PROSTATE SR PO) Take 1 tablet by mouth daily.     . simvastatin (ZOCOR)  40 MG tablet Take 40 mg by mouth daily.     No current facility-administered medications for this visit.   Facility-Administered Medications Ordered in Other Visits  Medication Dose Route Frequency Provider Last Rate Last Admin  . acetaminophen (TYLENOL) tablet 650 mg  650 mg Oral Once Lequita Asal, MD      . acetaminophen (TYLENOL) tablet 650 mg  650 mg Oral Once Lequita Asal, MD      . acetaminophen (TYLENOL) tablet 650 mg  650 mg Oral Once Lequita Asal, MD      . acetaminophen (TYLENOL) tablet 650 mg  650 mg Oral Once Lequita Asal, MD      . acetaminophen (TYLENOL) tablet 650 mg  650 mg Oral Once Lequita Asal, MD      . diphenhydrAMINE (BENADRYL) capsule 25 mg  25 mg Oral Once Lequita Asal, MD      . diphenhydrAMINE (BENADRYL) capsule 25 mg  25 mg Oral Once Nolon Stalls C, MD      . diphenhydrAMINE (BENADRYL) capsule 25 mg  25 mg Oral Once Nolon Stalls C, MD      . diphenhydrAMINE (BENADRYL) capsule 25 mg  25 mg Oral Once Nolon Stalls C, MD      . diphenhydrAMINE (BENADRYL) capsule 25 mg  25 mg Oral Once Lequita Asal, MD          .  PHYSICAL EXAMINATION: ECOG PERFORMANCE STATUS: 2 - Symptomatic, <50% confined to bed  There were no vitals filed for this visit. There were no vitals filed for this visit.  Physical Exam Constitutional:      Comments: Elderly male patient resting in the chair.  Accompanied by his wife/son.  Nauseated.   HENT:     Head: Normocephalic and atraumatic.     Mouth/Throat:     Pharynx: No oropharyngeal exudate.  Eyes:     Pupils: Pupils are equal, round, and reactive to light.  Cardiovascular:     Rate and Rhythm: Tachycardia present. Rhythm irregular.  Pulmonary:     Effort: No respiratory distress.  Breath sounds: No wheezing.     Comments: Decreased air entry bilaterally.  No wheeze or crackles. Abdominal:     General: Bowel sounds are normal. There is no distension.      Palpations: Abdomen is soft. There is no mass.     Tenderness: There is no abdominal tenderness. There is no guarding or rebound.  Musculoskeletal:        General: No tenderness. Normal range of motion.     Cervical back: Normal range of motion and neck supple.  Skin:    General: Skin is warm.  Neurological:     Mental Status: He is alert and oriented to person, place, and time.  Psychiatric:        Mood and Affect: Affect normal.      LABORATORY DATA:  I have reviewed the data as listed Lab Results  Component Value Date   WBC 76.4 (HH) 01/28/2021   HGB 7.7 (L) 01/28/2021   HCT 23.8 (L) 01/28/2021   MCV 87.2 01/28/2021   PLT 40 (L) 01/28/2021   Recent Labs    03/20/20 0920 04/17/20 1779 05/22/20 0905 06/19/20 0951 01/20/21 0844 01/22/21 0906 01/24/21 1157 01/24/21 1644 01/26/21 0416 01/27/21 0322 01/28/21 0357  NA 136 135 133*   < > 136 139 135   < > 138 139 140  K 4.4 4.3 4.1   < > 3.3* 3.1* 2.2*   < > 4.0 4.0 3.9  CL 101 103 100   < > 99 104 99   < > 104 104 108  CO2 25 24 24    < > 24 28 22    < > 23 23 23   GLUCOSE 211* 194* 199*   < > 228* 220* 248*   < > 180* 140* 139*  BUN 23 27* 26*   < > 16 19 19    < > 40* 41* 32*  CREATININE 1.07 1.07 0.98   < > 1.29* 1.31* 1.59*   < > 1.94* 1.62* 1.30*  CALCIUM 8.7* 8.5* 8.7*   < > 9.0 9.0 8.8*   < > 8.2* 7.8* 8.0*  GFRNONAA >60 60* >60   < > 52* 51* 40*   < > 32* 40* 52*  GFRAA >60 >60 >60  --   --   --   --   --   --   --   --   PROT 7.1 7.0 7.0   < > 7.1 7.0 6.9  --   --   --   --   ALBUMIN 3.9 3.9 4.1   < > 3.9 3.8 3.6  --   --   --   --   AST 22 23 25    < > 47* 48* 50*  --   --   --   --   ALT 21 21 23    < > 40 41 40  --   --   --   --   ALKPHOS 30* 30* 31*   < > 57 51 53  --   --   --   --   BILITOT 0.9 0.5 0.9   < > 0.9 0.7 0.8  --   --   --   --    < > = values in this interval not displayed.    RADIOGRAPHIC STUDIES: I have personally reviewed the radiological images as listed and agreed with the findings in  the report. CT CHEST WO CONTRAST  Result Date: 01/24/2021 CLINICAL DATA:  87 year old  with cough and dyspnea. Acute myeloid leukemia. EXAM: CT CHEST WITHOUT CONTRAST TECHNIQUE: Multidetector CT imaging of the chest was performed following the standard protocol without IV contrast. COMPARISON:  CT abdomen 09/16/2017 FINDINGS: Cardiovascular: Normal caliber of the thoracic aorta with diffuse calcifications. Coronary arteries are heavily calcified. Minimal pericardial fluid. Heart size is within normal limits. Mediastinum/Nodes: No significant mediastinal lymphadenopathy. Largest lymph node measures 1.0 cm in the short axis in the precarinal region on sequence 2 image 68. Limited evaluation for hilar lymph node enlargement without intravascular contrast. Moderate to large sized hiatal hernia with contrast in the lumen. Small right supraclavicular irregular lymph nodes are nonspecific. Small axillary lymph nodes bilaterally. Lungs/Pleura: Trachea is patent. Large amount of airspace disease in the right lower lobe. Patchy airspace densities in the left upper lobe and left lower lobe. There is minimal disease in the right middle lobe and right upper lobe. Findings are most compatible with pneumonia. No significant pleural fluid. Upper Abdomen: Splenomegaly. Musculoskeletal: No acute bone abnormality. IMPRESSION: 1. Bilateral multifocal pneumonia. The disease is most severe in the right lower lobe. 2. Splenomegaly. This is likely associated with patient's acute myeloid leukemia. 3. Moderate to large sized hiatal hernia. 4.  Aortic Atherosclerosis (ICD10-I70.0). Electronically Signed   By: Markus Daft M.D.   On: 01/24/2021 16:07   US RENAL  Result Date: 01/25/2021 CLINICAL DATA:  Acute renal insufficiency. EXAM: RENAL / URINARY TRACT ULTRASOUND COMPLETE COMPARISON:  Body CT September 16, 2017 FINDINGS: Right Kidney: Renal measurements: 10.4 x 5.3 x 5.6 cm = volume: 160 mL. Mildly increased echogenicity. No  hydronephrosis visualized. Benign-appearing 7 mm cyst is seen exophytic off of the midpole region of the right kidney. Left Kidney: Renal measurements: 12.7 x 7.5 x 5.2 cm = volume: 259 mL. Mildly increased echogenicity. No mass or hydronephrosis visualized. Bladder: Appears normal for degree of bladder distention. Other: None. IMPRESSION: Mildly increased cortical echogenicity of bilateral kidneys, consistent with medical renal disease. Electronically Signed   By: Fidela Salisbury M.D.   On: 01/25/2021 19:39   DG Chest Port 1 View  Result Date: 01/24/2021 CLINICAL DATA:  Sepsis. EXAM: PORTABLE CHEST 1 VIEW COMPARISON:  November 08, 2006. FINDINGS: The heart size and mediastinal contours are within normal limits. Mild bibasilar subsegmental atelectasis is noted. The visualized skeletal structures are unremarkable. IMPRESSION: Mild bibasilar subsegmental atelectasis. Aortic Atherosclerosis (ICD10-I70.0). Electronically Signed   By: Marijo Conception M.D.   On: 01/24/2021 12:24   ECHOCARDIOGRAM COMPLETE  Result Date: 01/25/2021    ECHOCARDIOGRAM REPORT   Patient Name:   Paul Pham. Date of Exam: 01/25/2021 Medical Rec #:  [id number]        Height:       73.0 in Accession #:    [id number]       Weight:       166.9 lb Date of Birth:  22-May-1926        BSA:          1.992 m Patient Age:    77 years         BP:           104/40 mmHg Patient Gender: M                HR:           97 bpm. Exam Location:  [facility] Procedure: 2D Echo Indications:     CHF I50.21  History:         Patient has no prior  history of Echocardiogram examinations.  Sonographer:     Kathlen Brunswick RDCS Referring Phys:  [id number] Ottie Glazier Diagnosing Phys: Ida Rogue MD  Sonographer Comments: Technically difficult study due to poor echo windows. Image acquisition challenging due to uncooperative patient. IMPRESSIONS  1. Left ventricular ejection fraction, by estimation, is 55 to 60%. The left ventricle has normal function. The left  ventricle has no regional wall motion abnormalities. Left ventricular diastolic parameters are consistent with Grade II diastolic dysfunction (pseudonormalization).  2. Right ventricular systolic function is normal. The right ventricular size is normal. There is moderately elevated pulmonary artery systolic pressure. The estimated right ventricular systolic pressure is 49.7 mmHg.  3. Left atrial size was mildly dilated.  4. Tricuspid valve regurgitation is mild to moderate.  5. The inferior vena cava is dilated in size with <50% respiratory variability, suggesting right atrial pressure of 15 mmHg. FINDINGS  Left Ventricle: Left ventricular ejection fraction, by estimation, is 55 to 60%. The left ventricle has normal function. The left ventricle has no regional wall motion abnormalities. The left ventricular internal cavity size was normal in size. There is  no left ventricular hypertrophy. Left ventricular diastolic parameters are consistent with Grade II diastolic dysfunction (pseudonormalization). Right Ventricle: The right ventricular size is normal. No increase in right ventricular wall thickness. Right ventricular systolic function is normal. There is moderately elevated pulmonary artery systolic pressure. The tricuspid regurgitant velocity is 2.97 m/s, and with an assumed right atrial pressure of 15 mmHg, the estimated right ventricular systolic pressure is 02.6 mmHg. Left Atrium: Left atrial size was mildly dilated. Right Atrium: Right atrial size was normal in size. Pericardium: There is no evidence of pericardial effusion. Mitral Valve: The mitral valve is normal in structure. No evidence of mitral valve regurgitation. No evidence of mitral valve stenosis. Tricuspid Valve: The tricuspid valve is normal in structure. Tricuspid valve regurgitation is mild to moderate. No evidence of tricuspid stenosis. Aortic Valve: The aortic valve is normal in structure. Aortic valve regurgitation is not visualized. Aortic  regurgitation PHT measures 520 msec. No aortic stenosis is present. Aortic valve peak gradient measures 8.8 mmHg. Pulmonic Valve: The pulmonic valve was normal in structure. Pulmonic valve regurgitation is not visualized. No evidence of pulmonic stenosis. Aorta: The aortic root is normal in size and structure. Venous: The inferior vena cava is dilated in size with less than 50% respiratory variability, suggesting right atrial pressure of 15 mmHg. IAS/Shunts: No atrial level shunt detected by color flow Doppler.  LEFT VENTRICLE PLAX 2D LVIDd:         3.98 cm Diastology LVIDs:         2.72 cm LV e' medial:    7.18 cm/s LV PW:         1.31 cm LV E/e' medial:  11.3 LV IVS:        1.34 cm LV e' lateral:   9.46 cm/s                        LV E/e' lateral: 8.6  RIGHT VENTRICLE RV Basal diam:  3.14 cm RV S prime:     18.30 cm/s TAPSE (M-mode): 2.2 cm LEFT ATRIUM         Index LA diam:    5.10 cm 2.56 cm/m  AORTIC VALVE              PULMONIC VALVE AV Vmax:      148.00 cm/s PV Vmax:  1.02 m/s AV Peak Grad: 8.8 mmHg    PV Peak grad:  4.2 mmHg LVOT Vmax:    91.40 cm/s LVOT Vmean:   57.400 cm/s LVOT VTI:     0.204 m AI PHT:       520 msec MITRAL VALVE               TRICUSPID VALVE MV Area (PHT): 5.16 cm    TV Peak grad:   32.1 mmHg MV Decel Time: 147 msec    TV Vmax:        2.83 m/s MV E velocity: 81.40 cm/s  TR Peak grad:   35.3 mmHg MV A velocity: 73.30 cm/s  TR Vmax:        297.00 cm/s MV E/A ratio:  1.11                            SHUNTS                            Systemic VTI: 0.20 m Ida Rogue MD Electronically signed by Ida Rogue MD Signature Date/Time: 01/25/2021/12:51:54 PM    Final     ASSESSMENT & PLAN:   # Acute Myeloid leukemia- elevated WBC [59k-WBC; hb-6.8; platelets-70]-currently on Dacogen cycle number 1-day #4.  Hold Dacogen today-see acute issues below  # Worsening fatigue/ feeling weak/ Tachycardia-clinically suspicious for symptomatic A. fib with RVR.  Given life-threatening situation  recommend emergent evaluation in the emergency room.   #Severe anemia hemoglobin 6.8-chronic 7-8; from underlying acute leukemia.  Patient will need blood transfusion in the hospital.  #Worsening cough/O2 saturations 94 at rest-patient would need infectious work-up/including chest x-ray-to rule out any acute respiratory process.   #I had a long discussion the patient/wife and 2 sons regarding the seriousness of the illness; and the urgency to address his heart rate.  EMS was called/transported uneventfully.  Informed Dr. Rao-patient's primary oncologist  # DISPOSITION: # EMS to ER # follow up TBD-Dr.B  No problem-specific Assessment & Plan notes found for this encounter.  All questions were answered. The patient knows to call the clinic with any problems, questions or concerns.   Jacquelin Hawking, NP 02/02/2021 8:32 PM

## 2021-02-03 ENCOUNTER — Ambulatory Visit: Payer: TRICARE For Life (TFL)

## 2021-02-03 ENCOUNTER — Inpatient Hospital Stay: Payer: Medicare Other

## 2021-02-03 ENCOUNTER — Inpatient Hospital Stay: Payer: Medicare Other | Admitting: Oncology

## 2021-02-03 ENCOUNTER — Other Ambulatory Visit: Payer: TRICARE For Life (TFL)

## 2021-02-07 ENCOUNTER — Telehealth: Payer: Self-pay | Admitting: Hospice and Palliative Medicine

## 2021-02-07 NOTE — Telephone Encounter (Signed)
Yesterday, received a call from Jewel Baize, MD with hospice. Patient has asked about continuing transfusions and Dr. Gilford Rile asked [place] to speak with patient to clarify goals.   I called and had a lengthy conversation with son, [name]. Son did not think that patient would survive his most recent hospitalization son does not think that patient would benefit from transfusion and would prefer to continue hospice care and focus on comfort at home.  Together, we reviewed labs drawn this week by PCP.  Patient has progressive anemia and thrombocytopenia and worsening leukocytosis.  Son plans to speak with his father about goals.  Son thinks that patient is in denial regarding his prognosis.  Son also requested that the hospice nurse speak with patient regarding goals next week.  I offered to speak with patient directly but son did not think that patient would be able to hear me virtually.  Case discussed with Dr. Rogue Bussing.  I again called and updated Dr. Gilford Rile and hospice nurse, Ashby Dawes.  Hospice team will see patient next week to discuss goals and will let us know if any further intervention is requested by [place].

## 2021-02-10 ENCOUNTER — Telehealth: Payer: Self-pay | Admitting: Oncology

## 2021-02-10 ENCOUNTER — Other Ambulatory Visit: Payer: TRICARE For Life (TFL)

## 2021-02-10 ENCOUNTER — Ambulatory Visit: Payer: TRICARE For Life (TFL) | Admitting: Oncology

## 2021-02-10 NOTE — Telephone Encounter (Signed)
Can you please see him? I have no openings this week.

## 2021-02-10 NOTE — Telephone Encounter (Signed)
[facility] nurse called stating the family would like to schedule an appointment for Dr. Janese Banks or [facility] to arrange blood transfusions.  Routing to clinical team for follow up.  Please call family at [phone].

## 2021-02-11 ENCOUNTER — Ambulatory Visit: Payer: TRICARE For Life (TFL)

## 2021-02-11 NOTE — Telephone Encounter (Signed)
Paul Pham, Spoke with [name] and she will be bringing him in tomorrow afternoon.   Anderson [place]

## 2021-02-12 ENCOUNTER — Other Ambulatory Visit: Payer: Self-pay | Admitting: Oncology

## 2021-02-12 ENCOUNTER — Encounter: Payer: Self-pay | Admitting: Oncology

## 2021-02-12 ENCOUNTER — Encounter: Payer: Self-pay | Admitting: Hospice and Palliative Medicine

## 2021-02-12 ENCOUNTER — Other Ambulatory Visit: Payer: Self-pay | Admitting: *Deleted

## 2021-02-12 ENCOUNTER — Other Ambulatory Visit: Payer: Self-pay

## 2021-02-12 ENCOUNTER — Inpatient Hospital Stay

## 2021-02-12 ENCOUNTER — Inpatient Hospital Stay: Attending: Hospice and Palliative Medicine

## 2021-02-12 ENCOUNTER — Inpatient Hospital Stay (HOSPITAL_BASED_OUTPATIENT_CLINIC_OR_DEPARTMENT_OTHER): Admitting: Hospice and Palliative Medicine

## 2021-02-12 VITALS — BP 143/45 | HR 103 | Temp 98.7°F | Resp 17 | Wt 156.0 lb

## 2021-02-12 DIAGNOSIS — N183 Chronic kidney disease, stage 3 unspecified: Secondary | ICD-10-CM | POA: Insufficient documentation

## 2021-02-12 DIAGNOSIS — E876 Hypokalemia: Secondary | ICD-10-CM

## 2021-02-12 DIAGNOSIS — C9200 Acute myeloblastic leukemia, not having achieved remission: Secondary | ICD-10-CM

## 2021-02-12 DIAGNOSIS — D649 Anemia, unspecified: Secondary | ICD-10-CM

## 2021-02-12 DIAGNOSIS — Z7189 Other specified counseling: Secondary | ICD-10-CM

## 2021-02-12 DIAGNOSIS — D696 Thrombocytopenia, unspecified: Secondary | ICD-10-CM | POA: Insufficient documentation

## 2021-02-12 DIAGNOSIS — Z515 Encounter for palliative care: Secondary | ICD-10-CM

## 2021-02-12 DIAGNOSIS — D469 Myelodysplastic syndrome, unspecified: Secondary | ICD-10-CM

## 2021-02-12 DIAGNOSIS — E119 Type 2 diabetes mellitus without complications: Secondary | ICD-10-CM | POA: Diagnosis not present

## 2021-02-12 LAB — COMPREHENSIVE METABOLIC PANEL
ALT: 47 U/L — ABNORMAL HIGH (ref 0–44)
AST: 59 U/L — ABNORMAL HIGH (ref 15–41)
Albumin: 3.5 g/dL (ref 3.5–5.0)
Alkaline Phosphatase: 53 U/L (ref 38–126)
Anion gap: 12 (ref 5–15)
BUN: 8 mg/dL (ref 8–23)
CO2: 32 mmol/L (ref 22–32)
Calcium: 7.8 mg/dL — ABNORMAL LOW (ref 8.9–10.3)
Chloride: 91 mmol/L — ABNORMAL LOW (ref 98–111)
Creatinine, Ser: 1.36 mg/dL — ABNORMAL HIGH (ref 0.61–1.24)
GFR, Estimated: 49 mL/min — ABNORMAL LOW (ref >60)
Glucose, Bld: 344 mg/dL — ABNORMAL HIGH (ref 70–99)
Potassium: 2.4 mmol/L — CL (ref 3.5–5.1)
Sodium: 135 mmol/L (ref 135–145)
Total Bilirubin: 1 mg/dL (ref 0.3–1.2)
Total Protein: 6.8 g/dL (ref 6.5–8.1)

## 2021-02-12 LAB — CBC WITH DIFFERENTIAL/PLATELET
Abs Immature Granulocytes: 91.2 10*3/uL — ABNORMAL HIGH (ref 0.00–0.07)
Band Neutrophils: 5 %
Basophils Absolute: 0 10*3/uL (ref 0.0–0.1)
Basophils Relative: 0 %
Blasts: 3 %
Eosinophils Absolute: 7.6 10*3/uL — ABNORMAL HIGH (ref 0.0–0.5)
Eosinophils Relative: 3 %
HCT: 17.9 % — ABNORMAL LOW (ref 39.0–52.0)
Hemoglobin: 6.1 g/dL — ABNORMAL LOW (ref 13.0–17.0)
Lymphocytes Relative: 8 %
Lymphs Abs: 20.3 10*3/uL — ABNORMAL HIGH (ref 0.7–4.0)
MCH: 29.5 pg (ref 26.0–34.0)
MCHC: 34.1 g/dL (ref 30.0–36.0)
MCV: 86.5 fL (ref 80.0–100.0)
Metamyelocytes Relative: 12 %
Monocytes Absolute: 12.7 10*3/uL — ABNORMAL HIGH (ref 0.1–1.0)
Monocytes Relative: 5 %
Myelocytes: 21 %
Neutro Abs: 114 10*3/uL — ABNORMAL HIGH (ref 1.7–7.7)
Neutrophils Relative %: 40 %
Platelets: 80 10*3/uL — ABNORMAL LOW (ref 150–400)
Promyelocytes Relative: 3 %
RBC: 2.07 MIL/uL — ABNORMAL LOW (ref 4.22–5.81)
RDW: 18.2 % — ABNORMAL HIGH (ref 11.5–15.5)
Smear Review: NORMAL
WBC: 253.3 10*3/uL (ref 4.0–10.5)
nRBC: 1.4 % — ABNORMAL HIGH (ref 0.0–0.2)

## 2021-02-12 LAB — SAMPLE TO BLOOD BANK

## 2021-02-12 LAB — PREPARE RBC (CROSSMATCH)

## 2021-02-12 MED ORDER — POTASSIUM CHLORIDE IN NACL 20-0.9 MEQ/L-% IV SOLN
Freq: Once | INTRAVENOUS | Status: AC
Start: 2021-02-12 — End: 2021-02-12
  Filled 2021-02-12: qty 1000

## 2021-02-12 MED ORDER — POTASSIUM CHLORIDE CRYS ER 20 MEQ PO TBCR: 20.0000 meq | EXTENDED_RELEASE_TABLET | Freq: Two times a day (BID) | ORAL | 0 refills | Status: DC

## 2021-02-12 NOTE — Patient Instructions (Addendum)
[facility]  Discharge Instructions: Thank you for choosing [facility] to provide your oncology and hematology care.  If you have a lab appointment with the [facility], please go directly to the [facility] and check in at the registration area.  Wear comfortable clothing and clothing appropriate for easy access to any Portacath or PICC line.   We strive to give you quality time with your provider. You may need to reschedule your appointment if you arrive late (15 or more minutes).  Arriving late affects you and other patients whose appointments are after yours.  Also, if you miss three or more appointments without notifying the office, you may be dismissed from the clinic at the provider's discretion.      For prescription refill requests, have your pharmacy contact our office and allow 72 hours for refills to be completed.    Today you received  IV potassium    To help prevent nausea and vomiting after your treatment, we encourage you to take your nausea medication as directed.  BELOW ARE SYMPTOMS THAT SHOULD BE REPORTED IMMEDIATELY: *FEVER GREATER THAN 100.4 F (38 C) OR HIGHER *CHILLS OR SWEATING *NAUSEA AND VOMITING THAT IS NOT CONTROLLED WITH YOUR NAUSEA MEDICATION *UNUSUAL SHORTNESS OF BREATH *UNUSUAL BRUISING OR BLEEDING *URINARY PROBLEMS (pain or burning when urinating, or frequent urination) *BOWEL PROBLEMS (unusual diarrhea, constipation, pain near the anus) TENDERNESS IN MOUTH AND THROAT WITH OR WITHOUT PRESENCE OF ULCERS (sore throat, sores in mouth, or a toothache) UNUSUAL RASH, SWELLING OR PAIN  UNUSUAL VAGINAL DISCHARGE OR ITCHING   Items with * indicate a potential emergency and should be followed up as soon as possible or go to the Emergency Department if any problems should occur.  Please show the CHEMOTHERAPY ALERT CARD or IMMUNOTHERAPY ALERT CARD at check-in to the Emergency Department and triage  nurse.  Should you have questions after your visit or need to cancel or reschedule your appointment, please contact [name]  [phone] and follow the prompts.  Office hours are 8:00 a.m. to 4:30 p.m. Monday - Friday. Please note that voicemails left after 4:00 p.m. may not be returned until the following business day.  We are closed weekends and major holidays. You have access to a nurse at all times for urgent questions. Please call the main number to the clinic [phone] and follow the prompts.  For any non-urgent questions, you may also contact your provider using MyChart. We now offer e-Visits for anyone 15 and older to request care online for non-urgent symptoms. For details visit [email or web address].   Also download the MyChart app! Go to the app store, search "MyChart", open the app, select [facility], and log in with your MyChart username and password.  Due to Covid, a mask is required upon entering the hospital/clinic. If you do not have a mask, one will be given to you upon arrival. For doctor visits, patients may have 1 support person aged 63 or older with them. For treatment visits, patients cannot have anyone with them due to current Covid guidelines and our immunocompromised population.   Potassium acetate injection What is this medication? POTASSIUM ACETATE (poe TASS i um ASa tate) is a potassium supplement used to prevent and to treat low potassium. Potassium is important for the heart, muscles, and nerves. Too much or too little potassium in the body can causeserious problems. This medicine may be used for other purposes; ask your health care provider  orpharmacist if you have questions. What should I tell my care team before I take this medication? They need to know if you have any of these conditions: Addison's disease dehydration diabetes heart disease high levels of potassium in the blood irregular heartbeat kidney  disease liver disease recent severe burn an unusual or allergic reaction to potassium, other medicines, foods, dyes, or preservatives pregnant or trying to get pregnant breast-feeding How should I use this medication? This medicine is for infusion into a vein. It is given by a health careprofessional in a hospital or clinic setting. Talk to your pediatrician regarding the use of this medicine in children.Special care may be needed. Overdosage: If you think you have taken too much of this medicine contact apoison control center or emergency room at once. NOTE: This medicine is only for you. Do not share this medicine with others. What if I miss a dose? This does not apply. What may interact with this medication? Do not take this medicine with any of the following medications: certain diuretics such as spironolactone, triamterene eplerenone sodium polystyrene sulfonate This medicine may also interact with the following medications: certain medicines for blood pressure or heart disease like lisinopril, losartan, quinapril, valsartan medicines that lower your chance of fighting infection such as cyclosporine, tacrolimus NSAIDs, medicines for pain and inflammation, like ibuprofen or naproxen other potassium supplements salt substitutes This list may not describe all possible interactions. Give your health care provider a list of all the medicines, herbs, non-prescription drugs, or dietary supplements you use. Also tell them if you smoke, drink alcohol, or use illegaldrugs. Some items may interact with your medicine. What should I watch for while using this medication? Your condition will be monitored carefully while you are receiving thismedicine. You may need blood work done while you are taking this medicine. What side effects may I notice from receiving this medication? Side effects that you should report to your doctor or health care professionalas soon as possible: allergic reactions  like skin rash, itching or hives, swelling of the face, lips, or tongue breathing problems confusion fast, irregular heartbeat feeling faint or lightheaded, falls low blood pressure numbness or tingling in hands or feet pain, redness, or irritation at site where injected unusually weak or tired weakness, heaviness of legs Side effects that usually do not require medical attention (report these toyour doctor or health care professional if they continue or are bothersome): diarrhea nausea, vomiting stomach pain This list may not describe all possible side effects. Call your doctor for medical advice about side effects. You may report side effects to FDA at1-800-FDA-1088. Where should I keep my medication? This drug is given in a hospital or clinic and will not be stored at home. NOTE: This sheet is a summary. It may not cover all possible information. If you have questions about this medicine, talk to your doctor, pharmacist, orhealth care provider.  2022 [employer]/Gold Standard (2016-05-20 09:51:49)

## 2021-02-12 NOTE — Progress Notes (Signed)
Patient tolerated KCL infusion well today, no concerns voiced. Patient discharged with family. Aware of blood transfusion tomorrow. No questions. Stable.

## 2021-02-12 NOTE — Progress Notes (Signed)
Paul Pham  [phone] [phone]   Name: Paul Pham. Date: 02/12/2021 MRN: [id number]  DOB: 02-17-1926  Patient Care Team: Tracie Harrier, MD as PCP - General (Internal Medicine) Lequita Asal, MD as PCP - Hematology/Oncology (Oncology)    REASON FOR CONSULTATION: Paul Pham. is a 87 y.o. male with multiple medical problems including AML, diabetes, CKD stage III.  Patient was started on Dacogen for AML, which he received on 01/20/2021.  Unfortunately, he was subsequently hospitalized 01/24/2021-01/28/2021 with sepsis from multifocal pneumonia.  Patient required transfusion with several units of PRBC.  Patient was ultimately discharged home on hospice.  Patient has been talking with hospice about continuing blood transfusions.  He was referred to palliative care today in the clinic to discuss goals.  SOCIAL HISTORY:     reports that he has never smoked. He has never used smokeless tobacco. He reports that he does not drink alcohol and does not use drugs.  Patient is married and lives at home with his wife.  He has sons who are involved in his care.  Patient retired from [employer].  ADVANCE DIRECTIVES:  On file  CODE STATUS: DNR  PAST MEDICAL HISTORY: Past Medical History:  Diagnosis Date   Anemia    Arthritis    BPH (benign prostatic hypertrophy)    Cancer (HCC)    Complication of anesthesia    nausea   Diabetes type 2, controlled ([facility])    HOH (hard of hearing)    r and L ears-70% loss per pt   Hyperlipidemia    Hypertension    Myelodysplasia (myelodysplastic syndrome) ([facility]) 2019    PAST SURGICAL HISTORY:  Past Surgical History:  Procedure Laterality Date   APPENDECTOMY     COLONOSCOPY  09/21/08   1 polyp found, tubular adenoma   HAND SURGERY     KNEE SURGERY Left     HEMATOLOGY/ONCOLOGY HISTORY:  Oncology History Overview Note  Patient was seen during his most recent  admission on 08/18/2017. Patient is hard of hearing. He has been feeling increased fatigue, dyspnea, and hemoglobin has dropped from baseline 10 to 7 within the past couple of months. Last colonoscopy was done 10 years ago. Outpatient stool occult test is negative. Patient also takes iron pills and stool is always black.  # He has had extensive workup including urinalysis negative for hematuria, normal I01 and folic level. Normal TSH, negative hemolysis workup. Her stool occult  is being negative as well. 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 Patient and his family members understand that this condition is not curable.   Treatment:  Azacitidine 75 mg/m2  Day 1-5 of every 28 days cycle.  Cycle 1 3/7-3/8, 3/11-13 cycle 2  12/06/17- 12/10/17, day 1-5 Cycle 3 plan 5/6 -5/10. Cycle 4 6/3-6/7 Cycle 5. Plan for 7/8-7/12 d/t holiday   Aranesp 11mg 3/14 and 3/28 3080m 5/10, 5/24, 5/31, 6/14 and 6/21     [facility] [place] [facility] Diagnosis   [place]   01/20/2021 -  Chemotherapy    Patient is on Treatment Plan: LEUKEMIA AML DECITABINE Q28D       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[facility] 12/27/2020 Initial Diagnosis   Acute myeloid  leukemia not having achieved remission ([place])    01/20/2021 -  Chemotherapy    Patient is on Treatment Plan: LEUKEMIA AML DECITABINE Q28D         ALLERGIES:  has No Known Allergies.  MEDICATIONS:  Current Outpatient Medications  Medication Sig Dispense Refill   acyclovir (ZOVIRAX) 400 MG tablet Take 1 tablet (400 mg total) by mouth 2 (two) times daily. 60 tablet 3   albuterol (VENTOLIN HFA) 108 (90 Base) MCG/ACT inhaler Inhale 2 puffs into the lungs every 6 (six) hours as needed  for wheezing or shortness of breath. 8 g 2   allopurinol (ZYLOPRIM) 300 MG tablet Take 1 tablet (300 mg total) by mouth daily. 30 tablet 2   Calcium Carbonate-Vit D-Min (CALCIUM 1200 PO) Take 1,200 mg by mouth in the morning and at bedtime.     Cranberry (THERACRAN PO) Take 1 tablet by mouth daily.      cyanocobalamin 1000 MCG tablet Take 1,000 mcg by mouth daily.     finasteride (PROSCAR) 5 MG tablet Take 5 mg by mouth daily.     glimepiride (AMARYL) 1 MG tablet Take 1 tablet by mouth daily after breakfast.     glucose blood test strip FreeStyle Lite Strips     guaiFENesin (MUCINEX) 600 MG 12 hr tablet Take 1 tablet (600 mg total) by mouth 2 (two) times daily. 60 tablet 2   LANCETS ULTRA FINE MISC Lancets,Ultra Thin 26 gauge     lidocaine-prilocaine (EMLA) cream Apply to affected area once 30 g 3   loperamide (IMODIUM) 2 MG capsule Take 1 capsule (2 mg total) by mouth See admin instructions. With onset of loose stool, take 57m followed by 219mevery 2 hours until loose bowel movement stopped. Maximum: 16 mg/day (Patient not taking: Reported on 01/24/2021) 30 capsule 0   meclizine (ANTIVERT) 25 MG tablet Take 25 mg by mouth 2 (two) times daily as needed. (Patient not taking: Reported on 01/24/2021)     metformin (FORTAMET) 1000 MG (OSM) 24 hr tablet Take 1,000 mg by mouth 2 (two) times daily with a meal. (Patient not taking: Reported on 01/24/2021)     Multiple Vitamins-Minerals (CENTRUM SILVER PO) Take by mouth.     mupirocin ointment (BACTROBAN) 2 % Apply 1 application topically daily. With dressing changes (Patient not taking: Reported on 01/24/2021) 22 g 0   Omega-3 Fatty Acids (FISH OIL PO) Take 1 tablet by mouth daily.      ondansetron (ZOFRAN) 4 MG tablet Take 1 tablet (4 mg total) by mouth every 6 (six) hours as needed for nausea or vomiting. (Patient not taking: Reported on 01/24/2021) 30 tablet 2   ondansetron (ZOFRAN) 8 MG tablet Take 1 tablet (8 mg total) by mouth 2 (two) times daily as  needed (Nausea or vomiting). 30 tablet 1   Polyethylene Glycol 3350 (MIRALAX PO) Take by mouth as needed.  (Patient not taking: Reported on 01/24/2021)     Saw Palmetto-Phytosterols (PROSTATE SR PO) Take 1 tablet by mouth daily.      simvastatin (ZOCOR) 40 MG tablet Take 40 mg by mouth daily.     No current facility-administered medications for this visit.   Facility-Administered Medications Ordered in Other Visits  Medication Dose Route Frequency Provider Last Rate Last Admin   acetaminophen (TYLENOL) tablet 650 mg  650 mg Oral Once CoLequita AsalMD       acetaminophen (TYLENOL) tablet 650 mg  650 mg Oral Once CoLequita AsalMD  acetaminophen (TYLENOL) tablet 650 mg  650 mg Oral Once Lequita Asal, MD       acetaminophen (TYLENOL) tablet 650 mg  650 mg Oral Once Lequita Asal, MD       acetaminophen (TYLENOL) tablet 650 mg  650 mg Oral Once Lequita Asal, MD       diphenhydrAMINE (BENADRYL) capsule 25 mg  25 mg Oral Once Lequita Asal, MD       diphenhydrAMINE (BENADRYL) capsule 25 mg  25 mg Oral Once Lequita Asal, MD       diphenhydrAMINE (BENADRYL) capsule 25 mg  25 mg Oral Once Lequita Asal, MD       diphenhydrAMINE (BENADRYL) capsule 25 mg  25 mg Oral Once Lequita Asal, MD       diphenhydrAMINE (BENADRYL) capsule 25 mg  25 mg Oral Once Lequita Asal, MD        VITAL SIGNS: There were no vitals taken for this visit. There were no vitals filed for this visit.  Estimated body mass index is 22.02 kg/m as calculated from the following:   Height as of 01/24/21: '6\' 1"'  (1.854 m).   Weight as of 01/24/21: 166 lb 14.2 oz (75.7 kg).  LABS: CBC:    Component Value Date/Time   WBC 76.4 (HH) 01/28/2021 0357   HGB 7.7 (L) 01/28/2021 0357   HGB 8.8 (L) 12/27/2020 1235   HCT 23.8 (L) 01/28/2021 0357   PLT 40 (L) 01/28/2021 0357   MCV 87.2 01/28/2021 0357   NEUTROABS 86.8 (H) 01/24/2021 1157   LYMPHSABS 4.2 (H) 01/24/2021  1157   MONOABS 52.7 (H) 01/24/2021 1157   EOSABS 0.4 01/24/2021 1157   BASOSABS 0.5 (H) 01/24/2021 1157   Comprehensive Metabolic Panel:    Component Value Date/Time   NA 140 01/28/2021 0357   K 3.9 01/28/2021 0357   CL 108 01/28/2021 0357   CO2 23 01/28/2021 0357   BUN 32 (H) 01/28/2021 0357   CREATININE 1.30 (H) 01/28/2021 0357   GLUCOSE 139 (H) 01/28/2021 0357   CALCIUM 8.0 (L) 01/28/2021 0357   AST 50 (H) 01/24/2021 1157   ALT 40 01/24/2021 1157   ALKPHOS 53 01/24/2021 1157   BILITOT 0.8 01/24/2021 1157   PROT 6.9 01/24/2021 1157   ALBUMIN 3.6 01/24/2021 1157    RADIOGRAPHIC STUDIES: CT CHEST WO CONTRAST  Result Date: 01/24/2021 CLINICAL DATA:  87 year old with cough and dyspnea. Acute myeloid leukemia. EXAM: CT CHEST WITHOUT CONTRAST TECHNIQUE: Multidetector CT imaging of the chest was performed following the standard protocol without IV contrast. COMPARISON:  CT abdomen 09/16/2017 FINDINGS: Cardiovascular: Normal caliber of the thoracic aorta with diffuse calcifications. Coronary arteries are heavily calcified. Minimal pericardial fluid. Heart size is within normal limits. Mediastinum/Nodes: No significant mediastinal lymphadenopathy. Largest lymph node measures 1.0 cm in the short axis in the precarinal region on sequence 2 image 68. Limited evaluation for hilar lymph node enlargement without intravascular contrast. Moderate to large sized hiatal hernia with contrast in the lumen. Small right supraclavicular irregular lymph nodes are nonspecific. Small axillary lymph nodes bilaterally. Lungs/Pleura: Trachea is patent. Large amount of airspace disease in the right lower lobe. Patchy airspace densities in the left upper lobe and left lower lobe. There is minimal disease in the right middle lobe and right upper lobe. Findings are most compatible with pneumonia. No significant pleural fluid. Upper Abdomen: Splenomegaly. Musculoskeletal: No acute bone abnormality. IMPRESSION: 1.  Bilateral multifocal pneumonia. The disease is most severe  in the right lower lobe. 2. Splenomegaly. This is likely associated with patient's acute myeloid leukemia. 3. Moderate to large sized hiatal hernia. 4.  Aortic Atherosclerosis (ICD10-I70.0). Electronically Signed   By: Markus Daft M.D.   On: 01/24/2021 16:07   US RENAL  Result Date: 01/25/2021 CLINICAL DATA:  Acute renal insufficiency. EXAM: RENAL / URINARY TRACT ULTRASOUND COMPLETE COMPARISON:  Body CT September 16, 2017 FINDINGS: Right Kidney: Renal measurements: 10.4 x 5.3 x 5.6 cm = volume: 160 mL. Mildly increased echogenicity. No hydronephrosis visualized. Benign-appearing 7 mm cyst is seen exophytic off of the midpole region of the right kidney. Left Kidney: Renal measurements: 12.7 x 7.5 x 5.2 cm = volume: 259 mL. Mildly increased echogenicity. No mass or hydronephrosis visualized. Bladder: Appears normal for degree of bladder distention. Other: None. IMPRESSION: Mildly increased cortical echogenicity of bilateral kidneys, consistent with medical renal disease. Electronically Signed   By: Fidela Salisbury M.D.   On: 01/25/2021 19:39   DG Chest Port 1 View  Result Date: 01/24/2021 CLINICAL DATA:  Sepsis. EXAM: PORTABLE CHEST 1 VIEW COMPARISON:  November 08, 2006. FINDINGS: The heart size and mediastinal contours are within normal limits. Mild bibasilar subsegmental atelectasis is noted. The visualized skeletal structures are unremarkable. IMPRESSION: Mild bibasilar subsegmental atelectasis. Aortic Atherosclerosis (ICD10-I70.0). Electronically Signed   By: Marijo Conception M.D.   On: 01/24/2021 12:24   ECHOCARDIOGRAM COMPLETE  Result Date: 01/25/2021    ECHOCARDIOGRAM REPORT   Patient Name:   Paul Pham. Date of Exam: 01/25/2021 Medical Rec #:  [id number]        Height:       73.0 in Accession #:    [id number]       Weight:       166.9 lb Date of Birth:  05/25/1926        BSA:          1.992 m Patient Age:    39 years         BP:            104/40 mmHg Patient Gender: M                HR:           97 bpm. Exam Location:  [facility] Procedure: 2D Echo Indications:     CHF I50.21  History:         Patient has no prior history of Echocardiogram examinations.  Sonographer:     Kathlen Brunswick RDCS Referring Phys:  [id number] Ottie Glazier Diagnosing Phys: Ida Rogue MD  Sonographer Comments: Technically difficult study due to poor echo windows. Image acquisition challenging due to uncooperative patient. IMPRESSIONS  1. Left ventricular ejection fraction, by estimation, is 55 to 60%. The left ventricle has normal function. The left ventricle has no regional wall motion abnormalities. Left ventricular diastolic parameters are consistent with Grade II diastolic dysfunction (pseudonormalization).  2. Right ventricular systolic function is normal. The right ventricular size is normal. There is moderately elevated pulmonary artery systolic pressure. The estimated right ventricular systolic pressure is 32.3 mmHg.  3. Left atrial size was mildly dilated.  4. Tricuspid valve regurgitation is mild to moderate.  5. The inferior vena cava is dilated in size with <50% respiratory variability, suggesting right atrial pressure of 15 mmHg. FINDINGS  Left Ventricle: Left ventricular ejection fraction, by estimation, is 55 to 60%. The left ventricle has normal function. The left ventricle has no regional wall motion abnormalities. The left ventricular  internal cavity size was normal in size. There is  no left ventricular hypertrophy. Left ventricular diastolic parameters are consistent with Grade II diastolic dysfunction (pseudonormalization). Right Ventricle: The right ventricular size is normal. No increase in right ventricular wall thickness. Right ventricular systolic function is normal. There is moderately elevated pulmonary artery systolic pressure. The tricuspid regurgitant velocity is 2.97 m/s, and with an assumed right atrial pressure of 15 mmHg, the estimated right  ventricular systolic pressure is 75.6 mmHg. Left Atrium: Left atrial size was mildly dilated. Right Atrium: Right atrial size was normal in size. Pericardium: There is no evidence of pericardial effusion. Mitral Valve: The mitral valve is normal in structure. No evidence of mitral valve regurgitation. No evidence of mitral valve stenosis. Tricuspid Valve: The tricuspid valve is normal in structure. Tricuspid valve regurgitation is mild to moderate. No evidence of tricuspid stenosis. Aortic Valve: The aortic valve is normal in structure. Aortic valve regurgitation is not visualized. Aortic regurgitation PHT measures 520 msec. No aortic stenosis is present. Aortic valve peak gradient measures 8.8 mmHg. Pulmonic Valve: The pulmonic valve was normal in structure. Pulmonic valve regurgitation is not visualized. No evidence of pulmonic stenosis. Aorta: The aortic root is normal in size and structure. Venous: The inferior vena cava is dilated in size with less than 50% respiratory variability, suggesting right atrial pressure of 15 mmHg. IAS/Shunts: No atrial level shunt detected by color flow Doppler.  LEFT VENTRICLE PLAX 2D LVIDd:         3.98 cm Diastology LVIDs:         2.72 cm LV e' medial:    7.18 cm/s LV PW:         1.31 cm LV E/e' medial:  11.3 LV IVS:        1.34 cm LV e' lateral:   9.46 cm/s                        LV E/e' lateral: 8.6  RIGHT VENTRICLE RV Basal diam:  3.14 cm RV S prime:     18.30 cm/s TAPSE (M-mode): 2.2 cm LEFT ATRIUM         Index LA diam:    5.10 cm 2.56 cm/m  AORTIC VALVE              PULMONIC VALVE AV Vmax:      148.00 cm/s PV Vmax:       1.02 m/s AV Peak Grad: 8.8 mmHg    PV Peak grad:  4.2 mmHg LVOT Vmax:    91.40 cm/s LVOT Vmean:   57.400 cm/s LVOT VTI:     0.204 m AI PHT:       520 msec MITRAL VALVE               TRICUSPID VALVE MV Area (PHT): 5.16 cm    TV Peak grad:   32.1 mmHg MV Decel Time: 147 msec    TV Vmax:        2.83 m/s MV E velocity: 81.40 cm/s  TR Peak grad:   35.3 mmHg MV  A velocity: 73.30 cm/s  TR Vmax:        297.00 cm/s MV E/A ratio:  1.11                            SHUNTS  Systemic VTI: 0.20 m Ida Rogue MD Electronically signed by Ida Rogue MD Signature Date/Time: 01/25/2021/12:51:54 PM    Final     PERFORMANCE STATUS (ECOG) : 4 - Bedbound  Review of Systems Unless otherwise noted, a complete review of systems is negative.  Physical Exam General: NAD Cardiovascular: regular rate and rhythm Pulmonary: clear ant fields Abdomen: soft, nontender, + bowel sounds GU: no suprapubic tenderness Extremities: no edema, no joint deformities Skin: no rashes Neurological: Weakness but otherwise nonfocal  IMPRESSION: I met with patient and son, Louie Casa, in the clinic today.  Patient's other son, [name], participated in the visit via phone.  Family describe marked decline over the past week.  Patient was ambulatory with a reasonable appetite last week.  Over the past few days, he has had poor oral intake and has become essentially chair/bedbound.  Patient verbalized to family that he feels like he is dying.  We had a lengthy and candid conversation regarding progression of AML.  He has mildly worse anemia and thrombocytopenia over the past week.  However, he has marked worsening of leukocytosis.  Patient verbalized a desire for transfusion.  He says that each time he has been transfused in the past he has felt better.  Discussed with Dr. Janese Banks and hospice MDs and will arrange for blood transfusion tomorrow.  However, I fear that transfusion is unlikely to provide patient with significant improvement at this point.  Note that sounds verbalized comfort with foregoing transfusion but wanted to honor patient's decision.  PLAN: -Continue supportive care at home with hospice -Schedule blood transfusion for tomorrow -Will give IV fluids and KCl today -Start K-Dur 20 mEq twice daily  Case and plan discussed with Dr. Janese Banks, Dr. Gilford Rile and Dr.  Clifton James with hospice. I also spoke with patient's hospice nurse, Ashby Dawes.   Patient expressed understanding and was in agreement with this plan. He also understands that He can call the clinic at any time with any questions, concerns, or complaints.     Time Total: 60 minutes  Visit consisted of counseling and education dealing with the complex and emotionally intense issues of symptom management and palliative care in the setting of serious and potentially life-threatening illness.Greater than 50%  of this time was spent counseling and coordinating care related to the above assessment and plan.  Signed by: Altha Harm, PhD, NP-C

## 2021-02-12 NOTE — Progress Notes (Signed)
Patient here for oncology follow-up appointment, expresses concerns of extreme fatigue and loss of appetite

## 2021-02-13 ENCOUNTER — Inpatient Hospital Stay

## 2021-02-13 DIAGNOSIS — C9200 Acute myeloblastic leukemia, not having achieved remission: Secondary | ICD-10-CM | POA: Diagnosis not present

## 2021-02-13 DIAGNOSIS — D649 Anemia, unspecified: Secondary | ICD-10-CM

## 2021-02-13 MED ORDER — ACETAMINOPHEN 325 MG PO TABS: 650.0000 mg | ORAL_TABLET | Freq: Once | ORAL | Status: DC

## 2021-02-13 MED ORDER — SODIUM CHLORIDE 0.9% IV SOLUTION
250.0000 mL | Freq: Once | INTRAVENOUS | Status: AC
  Administered 2021-02-13: 250 mL via INTRAVENOUS
  Filled 2021-02-13: qty 250

## 2021-02-13 NOTE — Patient Instructions (Signed)
[facility]COL[facility]   Discharge Instructions: Thank you for choosing [facility] to provide your oncology and hematology care.  If you have a lab appointment with the [facility], please go directly to the [facility] and check in at the registration area.  Wear comfortable clothing and clothing appropriate for easy access to any Portacath or PICC line.   We strive to give you quality time with your provider. You may need to reschedule your appointment if you arrive late (15 or more minutes).  Arriving late affects you and other patients whose appointments are after yours.  Also, if you miss three or more appointments without notifying the office, you may be dismissed from the clinic at the provider's discretion.      For prescription refill requests, have your pharmacy contact our office and allow 72 hours for refills to be completed.    Today you received a blood transfusion    To help prevent nausea and vomiting after your treatment, we encourage you to take your nausea medication as directed.  BELOW ARE SYMPTOMS THAT SHOULD BE REPORTED IMMEDIATELY: *FEVER GREATER THAN 100.4 F (38 C) OR HIGHER *CHILLS OR SWEATING *NAUSEA AND VOMITING THAT IS NOT CONTROLLED WITH YOUR NAUSEA MEDICATION *UNUSUAL SHORTNESS OF BREATH *UNUSUAL BRUISING OR BLEEDING *URINARY PROBLEMS (pain or burning when urinating, or frequent urination) *BOWEL PROBLEMS (unusual diarrhea, constipation, pain near the anus) TENDERNESS IN MOUTH AND THROAT WITH OR WITHOUT PRESENCE OF ULCERS (sore throat, sores in mouth, or a toothache) UNUSUAL RASH, SWELLING OR PAIN  UNUSUAL VAGINAL DISCHARGE OR ITCHING   Items with * indicate a potential emergency and should be followed up as soon as possible or go to the Emergency Department if any problems should occur.  Please show the CHEMOTHERAPY ALERT CARD or IMMUNOTHERAPY ALERT CARD at check-in to the Emergency Department and triage  nurse.  Should you have questions after your visit or need to cancel or reschedule your appointment, please contact [facility]  [phone] and follow the prompts.  Office hours are 8:00 a.m. to 4:30 p.m. Monday - Friday. Please note that voicemails left after 4:00 p.m. may not be returned until the following business day.  We are closed weekends and major holidays. You have access to a nurse at all times for urgent questions. Please call the main number to the clinic [phone] and follow the prompts.  For any non-urgent questions, you may also contact your provider using MyChart. We now offer e-Visits for anyone 4 and older to request care online for non-urgent symptoms. For details visit [email or web address].   Also download the MyChart app! Go to the app store, search "MyChart", open the app, select [facility], and log in with your MyChart username and password.  Due to Covid, a mask is required upon entering the hospital/clinic. If you do not have a mask, one will be given to you upon arrival. For doctor visits, patients may have 1 support person aged 44 or older with them. For treatment visits, patients cannot have anyone with them due to current Covid guidelines and our immunocompromised population.

## 2021-02-14 LAB — BPAM RBC
Blood Product Expiration Date: 202206282359
ISSUE DATE / TIME: 202206160902
Unit Type and Rh: 1700

## 2021-02-14 LAB — TYPE AND SCREEN
ABO/RH(D): B POS
Antibody Screen: NEGATIVE
Unit division: 0

## 2021-02-17 ENCOUNTER — Ambulatory Visit: Payer: TRICARE For Life (TFL) | Admitting: Oncology

## 2021-02-17 ENCOUNTER — Other Ambulatory Visit: Payer: TRICARE For Life (TFL)

## 2021-02-18 ENCOUNTER — Ambulatory Visit: Payer: TRICARE For Life (TFL)

## 2021-02-25 ENCOUNTER — Encounter: Payer: Self-pay | Admitting: Oncology

## 2021-03-05 ENCOUNTER — Other Ambulatory Visit: Payer: Self-pay | Admitting: Hospice and Palliative Medicine

## 2021-03-05 MED ORDER — OXYCODONE HCL 5 MG PO TABS: 5.0000 mg | ORAL_TABLET | Freq: Four times a day (QID) | ORAL | 0 refills | Status: AC | PRN

## 2021-03-05 NOTE — Progress Notes (Signed)
I spoke with patient's hospice nurse.  Patient is having bony pain in bilateral legs.  No swelling.  No numbness or tingling.  Pain is primarily at night when he is trying to rest.  Hospice was requesting something for pain.  Patient has been taking acetaminophen without improvement.  Will send Rx for oxycodone.

## 2021-03-09 ENCOUNTER — Other Ambulatory Visit: Payer: Self-pay

## 2021-03-09 ENCOUNTER — Encounter: Payer: Self-pay | Admitting: Oncology

## 2021-03-09 ENCOUNTER — Emergency Department
Admission: EM | Admit: 2021-03-09 | Discharge: 2021-03-31 | Disposition: E | Attending: Emergency Medicine | Admitting: Emergency Medicine

## 2021-03-09 DIAGNOSIS — I129 Hypertensive chronic kidney disease with stage 1 through stage 4 chronic kidney disease, or unspecified chronic kidney disease: Secondary | ICD-10-CM | POA: Insufficient documentation

## 2021-03-09 DIAGNOSIS — Z515 Encounter for palliative care: Secondary | ICD-10-CM | POA: Insufficient documentation

## 2021-03-09 DIAGNOSIS — Z7984 Long term (current) use of oral hypoglycemic drugs: Secondary | ICD-10-CM | POA: Diagnosis not present

## 2021-03-09 DIAGNOSIS — M545 Low back pain, unspecified: Secondary | ICD-10-CM | POA: Insufficient documentation

## 2021-03-09 DIAGNOSIS — N183 Chronic kidney disease, stage 3 unspecified: Secondary | ICD-10-CM | POA: Diagnosis not present

## 2021-03-09 DIAGNOSIS — C9590 Leukemia, unspecified not having achieved remission: Secondary | ICD-10-CM | POA: Insufficient documentation

## 2021-03-09 DIAGNOSIS — Y9301 Activity, walking, marching and hiking: Secondary | ICD-10-CM | POA: Insufficient documentation

## 2021-03-09 DIAGNOSIS — F0390 Unspecified dementia without behavioral disturbance: Secondary | ICD-10-CM | POA: Diagnosis not present

## 2021-03-09 DIAGNOSIS — E1122 Type 2 diabetes mellitus with diabetic chronic kidney disease: Secondary | ICD-10-CM | POA: Diagnosis not present

## 2021-03-09 DIAGNOSIS — Z79899 Other long term (current) drug therapy: Secondary | ICD-10-CM | POA: Diagnosis not present

## 2021-03-09 DIAGNOSIS — W1839XA Other fall on same level, initial encounter: Secondary | ICD-10-CM | POA: Diagnosis not present

## 2021-03-09 DIAGNOSIS — Z85828 Personal history of other malignant neoplasm of skin: Secondary | ICD-10-CM | POA: Insufficient documentation

## 2021-03-09 DIAGNOSIS — W19XXXA Unspecified fall, initial encounter: Secondary | ICD-10-CM

## 2021-03-09 DIAGNOSIS — D649 Anemia, unspecified: Secondary | ICD-10-CM | POA: Diagnosis not present

## 2021-03-09 NOTE — ED Triage Notes (Signed)
PER EMS: pt is a hospice patient from home who fell tonight from a standing position onto carpet, no LOC, does not take blood thinners. He reports back pain, bruise to RIGHT flank area.  Hx of Leukemia. Dementia at baseline.   CBG - 480 130/59, HR-110 afib (hx of),

## 2021-03-09 NOTE — ED Provider Notes (Signed)
[facility] Emergency Department Provider Note  ____________________________________________   Event Date/Time   First MD Initiated Contact with Patient 03/03/2021 2317     (approximate)  I have reviewed the triage vital signs and the nursing notes.   HISTORY  Chief Complaint Fall    HPI Paul Pham. is a 87 y.o. male with dementia, leukemia who comes in for a fall.  Patient is a hospice patient.  According to patient's son patient had a unwitnessed fall today.  He has been acting more confused and is now reporting some lower back pain.  His sugar was elevated in the 400s with EMS.  Patient is on hospice but still lives at home.  Son states that he had the fall because he was walking without his walker.  Unable to get full HPI due to patient's baseline dementia       Past Medical History:  Diagnosis Date   Anemia    Arthritis    BPH (benign prostatic hypertrophy)    Cancer (HCC)    Complication of anesthesia    nausea   Diabetes type 2, controlled ([place])    HOH (hard of hearing)    r and L ears-70% loss per pt   Hyperlipidemia    Hypertension    Myelodysplasia (myelodysplastic syndrome) ([place]) 2019    Patient Active Problem List   Diagnosis Date Noted   CKD (chronic kidney disease) stage 3, GFR 30-59 ml/min ([place]) 01/25/2021   AKI (acute kidney injury) ([place]) 01/25/2021   Sepsis ([place]) 01/24/2021   Hypokalemia 01/24/2021   Acute respiratory failure ([place]) 01/24/2021   Refractive error 12/31/2020   Perceptive hearing loss, both sides 12/31/2020   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[place]) 12/27/2020   Thrombocytopenia ([place]) 11/06/2020   Leukocytosis 10/16/2020   Contusion of lower leg 04/22/2020   Leukopenia 01/08/2020   Disorder of refraction and accommodation 01/24/2019   Type 2 diabetes mellitus with hyperglycemia, without long-term current use of insulin ([place]) 01/24/2019   Iron overload due to repeated red blood cell  transfusions 11/26/2018   Anemia in neoplastic disease 07/23/2018   Symptomatic anemia 07/23/2018   Encounter for antineoplastic chemotherapy 07/23/2018   Goals of care, counseling/discussion 10/07/2017   MDS (myelodysplastic syndrome) ([place]) 10/05/2017   Neutropenia ([place])    Anemia 08/17/2017   Hard of hearing 01/18/2014   HTN (hypertension) 01/18/2014    Past Surgical History:  Procedure Laterality Date   APPENDECTOMY     COLONOSCOPY  09/21/08   1 polyp found, tubular adenoma   HAND SURGERY     KNEE SURGERY Left     Prior to Admission medications   Medication Sig Start Date End Date Taking? Authorizing Provider  acyclovir (ZOVIRAX) 400 MG tablet Take 1 tablet (400 mg total) by mouth 2 (two) times daily. 01/21/21   Sindy Guadeloupe, MD  albuterol (VENTOLIN HFA) 108 (90 Base) MCG/ACT inhaler Inhale 2 puffs into the lungs every 6 (six) hours as needed for wheezing or shortness of breath. 01/28/21   Kathie Dike, MD  allopurinol (ZYLOPRIM) 300 MG tablet Take 1 tablet (300 mg total) by mouth daily. 01/15/21   Sindy Guadeloupe, MD  Calcium Carbonate-Vit D-Min (CALCIUM 1200 PO) Take 1,200 mg by mouth in the morning and at bedtime.    [provider]  Cranberry (THERACRAN PO) Take 1 tablet by mouth daily.     [provider]  cyanocobalamin 1000 MCG tablet Take 1,000 mcg by mouth daily.  [provider]  finasteride (PROSCAR) 5 MG tablet Take 5 mg by mouth daily.    [provider]  glimepiride (AMARYL) 1 MG tablet Take 1 tablet by mouth daily after breakfast. 01/24/19 01/24/21  [provider]  glucose blood test strip FreeStyle Lite Strips    [provider]  guaiFENesin (MUCINEX) 600 MG 12 hr tablet Take 1 tablet (600 mg total) by mouth 2 (two) times daily. 01/28/21 01/28/22  Kathie Dike, MD  LANCETS ULTRA FINE MISC Lancets,Ultra Thin 26 gauge    [provider]  lidocaine-prilocaine (EMLA) cream Apply to affected area once  01/15/21   Sindy Guadeloupe, MD  loperamide (IMODIUM) 2 MG capsule Take 1 capsule (2 mg total) by mouth See admin instructions. With onset of loose stool, take 4mg  followed by 2mg  every 2 hours until loose bowel movement stopped. Maximum: 16 mg/day Patient not taking: No sig reported 01/27/18   Earlie Server, MD  meclizine (ANTIVERT) 25 MG tablet Take 25 mg by mouth 2 (two) times daily as needed. Patient not taking: No sig reported    [provider]  metformin (FORTAMET) 1000 MG (OSM) 24 hr tablet Take 1,000 mg by mouth 2 (two) times daily with a meal. Patient not taking: No sig reported    [provider]  Multiple Vitamins-Minerals (CENTRUM SILVER PO) Take by mouth.    [provider]  mupirocin ointment (BACTROBAN) 2 % Apply 1 application topically daily. With dressing changes Patient not taking: No sig reported 01/09/20   Ralene Bathe, MD  Omega-3 Fatty Acids (FISH OIL PO) Take 1 tablet by mouth daily.     [provider]  ondansetron (ZOFRAN) 4 MG tablet Take 1 tablet (4 mg total) by mouth every 6 (six) hours as needed for nausea or vomiting. Patient not taking: No sig reported 07/18/19   Lequita Asal, MD  ondansetron (ZOFRAN) 8 MG tablet Take 1 tablet (8 mg total) by mouth 2 (two) times daily as needed (Nausea or vomiting). 01/15/21   Sindy Guadeloupe, MD  oxyCODONE (OXY IR/ROXICODONE) 5 MG immediate release tablet Take 1 tablet (5 mg total) by mouth every 6 (six) hours as needed for severe pain. 03/05/21   Borders, Kirt Boys, NP  Polyethylene Glycol 3350 (MIRALAX PO) Take by mouth as needed.  Patient not taking: No sig reported    [provider]  potassium chloride SA (KLOR-CON) 20 MEQ tablet Take 1 tablet (20 mEq total) by mouth 2 (two) times daily. 02/12/21   Borders, Kirt Boys, NP  Saw Palmetto-Phytosterols (PROSTATE SR PO) Take 1 tablet by mouth daily.     [provider]  simvastatin (ZOCOR) 40 MG tablet Take 40 mg by mouth daily.     [provider]    Allergies Patient has no known allergies.  Family History  Problem Relation Age of Onset   Aneurysm Mother    Heart disease Father    Cancer Brother        skin   Heart disease Brother    Heart disease Brother    Cancer Sister        skin   Aneurysm Brother    Heart disease Brother    Heart disease Sister    Heart disease Sister    COPD Sister    Diabetes Sister     Social History Social History   Tobacco Use   Smoking status: Never   Smokeless tobacco: Never  Vaping Use  Vaping Use: Never used  Substance Use Topics   Alcohol use: No    Alcohol/week: 0.0 standard drinks   Drug use: No      Review of Systems Review of systems is limited secondary to dementia but he is denying everything but back pain Constitutional: No fever/chills Eyes: No visual changes. ENT: No sore throat. Cardiovascular: Denies chest pain. Respiratory: Denies shortness of breath. Gastrointestinal: No abdominal pain.  No nausea, no vomiting.  No diarrhea.  No constipation. Genitourinary: Negative for dysuria. Musculoskeletal: Positive for back pain Skin: Negative for rash. Neurological: Negative for headaches, focal weakness or numbness. All other ROS negative ____________________________________________   PHYSICAL EXAM:  VITAL SIGNS: ED Triage Vitals  Enc Vitals Group     BP 03/12/2021 2318 (!) 133/56     Pulse Rate 03/06/2021 2318 (!) 111     Resp 03/25/2021 2318 18     Temp 03/29/2021 2318 97.6 F (36.4 C)     Temp Source 03/16/2021 2318 Oral     SpO2 03/05/2021 2318 91 %     Weight 03/18/2021 2319 172 lb 14.4 oz (78.4 kg)     Height --      Head Circumference --      Peak Flow --      Pain Score --      Pain Loc --      Pain Edu? --      Excl. in [place]? --     Constitutional: Alert looking around but complaining of back pain Eyes: Conjunctivae are normal. EOMI. Head: Atraumatic. Nose: No congestion/rhinnorhea. Mouth/Throat: Mucous membranes are  moist.   Neck: No stridor. Trachea Midline. FROM Cardiovascular: Normal rate, regular rhythm. Grossly normal heart sounds.  Good peripheral circulation. Respiratory: Normal respiratory effort.  No retractions. Lungs CTAB. Gastrointestinal: Soft and nontender. No distention. No abdominal bruits.  Musculoskeletal: No lower extremity tenderness nor edema.  No joint effusions.  No pain on his extremities Neurologic: Patient with some dementia.  Good grip strength bilaterally.  No obvious deficit Skin:  Skin is warm, dry and intact. No rash noted. Psychiatric: Mood and affect are normal. Speech and behavior are normal. GU: Deferred  Back: Some old bruising noted.  Some lower back pain ____________________________________________   LABS (all labs ordered are listed, but only abnormal results are displayed)  Labs Reviewed  CBC WITH DIFFERENTIAL/PLATELET - Abnormal; Notable for the following components:      Result Value   WBC 274.9 (*)    RBC 2.02 (*)    Hemoglobin 5.4 (*)    HCT 17.9 (*)    RDW 18.7 (*)    Platelets 125 (*)    nRBC 1.6 (*)    Neutro Abs 132.6 (*)    Lymphs Abs 38.3 (*)    Monocytes Absolute 25.6 (*)    Eosinophils Absolute 1.0 (*)    Basophils Absolute 0.8 (*)    Abs Immature Granulocytes 76.57 (*)    All other components within normal limits  BASIC METABOLIC PANEL - Abnormal; Notable for the following components:   Sodium 134 (*)    Chloride 95 (*)    Glucose, Bld 427 (*)    Creatinine, Ser 1.51 (*)    Calcium 8.1 (*)    GFR, Estimated 43 (*)    All other components within normal limits  URINALYSIS, COMPLETE (UACMP) WITH MICROSCOPIC - Abnormal; Notable for the following components:   Color, Urine YELLOW (*)    APPearance CLOUDY (*)    Glucose, UA >=  500 (*)    Hgb urine dipstick MODERATE (*)    Ketones, ur 5 (*)    Protein, ur 30 (*)    Bacteria, UA RARE (*)    All other components within normal limits  URINE CULTURE  PATHOLOGIST SMEAR REVIEW    ____________________________________________      PROCEDURES  Procedure(s) performed (including Critical Care):  Procedures   ____________________________________________   INITIAL IMPRESSION / ASSESSMENT AND PLAN / ED COURSE  Lecil Tapp. was evaluated in Emergency Department on 03/08/2021 for the symptoms described in the history of present illness. He was evaluated in the context of the global COVID-19 pandemic, which necessitated consideration that the patient might be at risk for infection with the SARS-CoV-2 virus that causes COVID-19. Institutional protocols and algorithms that pertain to the evaluation of patients at risk for COVID-19 are in a state of rapid change based on information released by regulatory bodies including the CDC and federal and state organizations. These policies and algorithms were followed during the patient's care in the ED.    Patient comes in with a mechanical fall but having had some confusion earlier in the day.  Patient is on hospice.  Discussed goals of care with the patient son.  Patient is tactical we will guardian is a wife but she has been in poor health and the sons have been in charge of his health recently.  He I spoke to the youngest son but he also called the older son to make sure that they were in agreement.  There goal is to keep the patient comfortable and did not want to do any CT imaging given they were unlikely to do any interventions therefore we will hold off on CT head and CT back.  They are more focused on his comfort and make sure he does not have a UTI.  We will get some basic labs to evaluate for dehydration and his elevated sugar but most likely they are going to want to try to take him back home and continue hospice care.  At this time again they want to keep him comfortable  12:31 AM patient vomited after p.o. medications therefore will give some IV Zofran and then IV fentanyl  2:25 AM reevaluated patient discussed with  the family.  Patient's hemoglobin is low but they stated that its been low previously and they are not doing blood transfusions due to it only prolonging life and then try to focus on comfort.  At this time they have declined blood transfusion.  His white count is elevated the setting of his known leukemia.  Patient still having some pain given he vomited up the oxycodone and Tylenol we will give some p.o. medications again.  Family would like to be able to take him home so we will try to ambulate him  5:22 AM  Even after medications patient is unable to ambulate.  Patient was getting more agitated and required some Haldol.  Further discussion with family and they cannot take care of him with his new mental status change and with his back pain preventing him from ambulating.  They still do not want further work-up but are interested in trying to get him into a hospice home.  We have discussed with the hospice team who come in to evaluate and placed a social work consult.  Patient will stay here until we can facilitate this.        ____________________________________________   FINAL CLINICAL IMPRESSION(S) / ED DIAGNOSES  Final diagnoses:  Fall, initial encounter  Anemia, unspecified type  Leukemia not having achieved remission, unspecified leukemia type ([place])  Hospice care      MEDICATIONS GIVEN DURING THIS VISIT:  Medications  lidocaine (LIDODERM) 5 % 1 patch (1 patch Transdermal Patch Applied 03-27-21 0016)  sodium chloride 0.9 % bolus 1,000 mL (0 mLs Intravenous Stopped 03-27-21 0231)  oxyCODONE (Oxy IR/ROXICODONE) immediate release tablet 5 mg (5 mg Oral Given 03/27/21 0020)  acetaminophen (TYLENOL) tablet 1,000 mg (1,000 mg Oral Given 03/27/2021 0020)  ondansetron (ZOFRAN) injection 4 mg (4 mg Intravenous Given 03-27-2021 0039)  fentaNYL (SUBLIMAZE) injection 50 mcg (50 mcg Intravenous Given 03/27/21 0121)  oxyCODONE (Oxy IR/ROXICODONE) immediate release tablet 5 mg (5 mg Oral Given  03/27/21 0230)  acetaminophen (TYLENOL) tablet 1,000 mg (1,000 mg Oral Given 03/27/2021 0230)  haloperidol lactate (HALDOL) injection 2 mg (2 mg Intravenous Given 03/27/21 0357)     ED Discharge Orders     None        Note:  This document was prepared using Dragon voice recognition software and may include unintentional dictation errors.    Vanessa [place], MD March 27, 2021 [phone]

## 2021-03-09 NOTE — ED Notes (Signed)
Pts son is here and states the patient is not normally confused, that he is usually A&OX4. His son also reports the fall was unwitnessed and that the patient's wife called the son to tell him she heard him fall but did not see the actual fall.

## 2021-03-10 LAB — URINALYSIS, COMPLETE (UACMP) WITH MICROSCOPIC
Bilirubin Urine: NEGATIVE
Glucose, UA: >=500 mg/dL — AB
Ketones, ur: 5 mg/dL — AB
Leukocytes,Ua: NEGATIVE
Nitrite: NEGATIVE
Protein, ur: 30 mg/dL — AB
Specific Gravity, Urine: 1.014 (ref 1.005–1.030)
pH: 5 (ref 5.0–8.0)

## 2021-03-10 LAB — CBC WITH DIFFERENTIAL/PLATELET
Abs Immature Granulocytes: 76.57 10*3/uL — ABNORMAL HIGH (ref 0.00–0.07)
Basophils Absolute: 0.8 10*3/uL — ABNORMAL HIGH (ref 0.0–0.1)
Basophils Relative: 0 %
Eosinophils Absolute: 1 10*3/uL — ABNORMAL HIGH (ref 0.0–0.5)
Eosinophils Relative: 0 %
HCT: 17.9 % — ABNORMAL LOW (ref 39.0–52.0)
Hemoglobin: 5.4 g/dL — ABNORMAL LOW (ref 13.0–17.0)
Immature Granulocytes: 28 %
Lymphocytes Relative: 14 %
Lymphs Abs: 38.3 10*3/uL — ABNORMAL HIGH (ref 0.7–4.0)
MCH: 26.7 pg (ref 26.0–34.0)
MCHC: 30.2 g/dL (ref 30.0–36.0)
MCV: 88.6 fL (ref 80.0–100.0)
Monocytes Absolute: 25.6 10*3/uL — ABNORMAL HIGH (ref 0.1–1.0)
Monocytes Relative: 9 %
Neutro Abs: 132.6 10*3/uL — ABNORMAL HIGH (ref 1.7–7.7)
Neutrophils Relative %: 49 %
Platelets: 125 10*3/uL — ABNORMAL LOW (ref 150–400)
RBC: 2.02 MIL/uL — ABNORMAL LOW (ref 4.22–5.81)
RDW: 18.7 % — ABNORMAL HIGH (ref 11.5–15.5)
WBC: 274.9 10*3/uL (ref 4.0–10.5)
nRBC: 1.6 % — ABNORMAL HIGH (ref 0.0–0.2)

## 2021-03-10 LAB — PATHOLOGIST SMEAR REVIEW

## 2021-03-10 LAB — BASIC METABOLIC PANEL
Anion gap: 13 (ref 5–15)
BUN: 23 mg/dL (ref 8–23)
CO2: 26 mmol/L (ref 22–32)
Calcium: 8.1 mg/dL — ABNORMAL LOW (ref 8.9–10.3)
Chloride: 95 mmol/L — ABNORMAL LOW (ref 98–111)
Creatinine, Ser: 1.51 mg/dL — ABNORMAL HIGH (ref 0.61–1.24)
GFR, Estimated: 43 mL/min — ABNORMAL LOW (ref >60)
Glucose, Bld: 427 mg/dL — ABNORMAL HIGH (ref 70–99)
Potassium: 3.5 mmol/L (ref 3.5–5.1)
Sodium: 134 mmol/L — ABNORMAL LOW (ref 135–145)

## 2021-03-10 MED ORDER — OXYCODONE HCL 5 MG PO TABS
5.0000 mg | ORAL_TABLET | Freq: Once | ORAL | Status: AC
  Administered 2021-03-10: 5 mg via ORAL
  Filled 2021-03-10: qty 1

## 2021-03-10 MED ORDER — ACETAMINOPHEN 500 MG PO TABS
1000.0000 mg | ORAL_TABLET | Freq: Once | ORAL | Status: AC
  Administered 2021-03-10: 1000 mg via ORAL
  Filled 2021-03-10: qty 2

## 2021-03-10 MED ORDER — HALOPERIDOL LACTATE 5 MG/ML IJ SOLN
2.0000 mg | Freq: Once | INTRAMUSCULAR | Status: AC
  Administered 2021-03-10: 2 mg via INTRAVENOUS
  Filled 2021-03-10: qty 1

## 2021-03-10 MED ORDER — FENTANYL CITRATE (PF) 100 MCG/2ML IJ SOLN
50.0000 ug | Freq: Once | INTRAMUSCULAR | Status: AC
  Administered 2021-03-10: 50 ug via INTRAVENOUS
  Filled 2021-03-10: qty 2

## 2021-03-10 MED ORDER — LIDOCAINE 5 % EX PTCH
1.0000 | MEDICATED_PATCH | CUTANEOUS | Status: DC
  Administered 2021-03-10: 1 via TRANSDERMAL
  Filled 2021-03-10: qty 1

## 2021-03-10 MED ORDER — SODIUM CHLORIDE 0.9 % IV BOLUS
1000.0000 mL | Freq: Once | INTRAVENOUS | Status: AC
  Administered 2021-03-10: 1000 mL via INTRAVENOUS

## 2021-03-10 MED ORDER — ONDANSETRON HCL 4 MG/2ML IJ SOLN
4.0000 mg | Freq: Once | INTRAMUSCULAR | Status: AC
  Administered 2021-03-10: 4 mg via INTRAVENOUS
  Filled 2021-03-10: qty 2

## 2021-03-12 LAB — URINE CULTURE: Culture: >=100000 — AB

## 2021-03-31 NOTE — Progress Notes (Addendum)
   2021/03/24 1425  Clinical Encounter Type  Visited With Other (Comment) (family of PT no longer here)  Visit Type Death  Referral From Nurse  Consult/Referral To Chaplain  Spiritual Encounters  Spiritual Needs Grief support  Chaplain Burris received notification of Paul Pham passing. Chaplain went to offer support to the family but they seem to have left already. Chaplain Burris checked-in again with the referring nurse to let her know and thanked her for the referral.

## 2021-03-31 NOTE — ED Notes (Signed)
Attempted to get patient out of bed to ambulate with a walker as requested by Dr. Jari Pigg. This RN placed a gait belt on him and has assistance from another RN but the patient could barely stand on his two feet while holding onto the walker. Dr. Jari Pigg informed.

## 2021-03-31 NOTE — ED Notes (Signed)
Pt vomited moments after taking the PO Oxycodone and Tylenol. Dr. Jari Pigg informed.

## 2021-03-31 NOTE — Progress Notes (Signed)
[facility] ED 17  [facility]) [facility] Liaison Note:  Current Hospice patient presented to ED s/p fall and increased confusion.   Initial plan was to transport patient to the Hospice Home.   Informed that patient had passed at 1310. Support given to patient's son, [name].   Continued Hospice support will be provided to patient's family through bereavement services.   Thank you,   Bobbie "Loren Racer, [place], BSN [facility] Liaison [phone]

## 2021-03-31 NOTE — ED Provider Notes (Signed)
I was asked by RN to evaluate the pt at approx 1305. Pt found bradycardic with agonal respirations, but in no apparent distress or discomfort. Pt is full comfort care, currently awaiting transfer to inpatient Hospice. Son called to bedside. Shortly thereafter, pt went into PEA then asystole. Time of death 13:10. Son at bedside, wife is on the way to the ED.   Duffy Bruce, MD 03/29/2021 2008

## 2021-03-31 NOTE — ED Notes (Signed)
Decrease in RR, HR and O2 sats noted.  Called to room by son, pt nted to have brown frothy drainage from mouth.  Pt cleaned, Dr. Ellender Hose called to room.  Pt noted to be asystole, no respirations.  TOD 1310

## 2021-03-31 NOTE — Progress Notes (Addendum)
Inpatient Diabetes Program Recommendations  AACE/ADA: New Consensus Statement on Inpatient Glycemic Control (2015)  Target Ranges:  Prepandial:   less than 140 mg/dL      Peak postprandial:   less than 180 mg/dL (1-2 hours)      Critically ill patients:  140 - 180 mg/dL   Lab Results  Component Value Date   GLUCAP 198 (H) 01/28/2021   HGBA1C 7.6 (H) 01/24/2021    Review of Glycemic Control Results for Paul Pham, Paul Pham (MRN [id number]) as of 22-Mar-2021 11:44  Ref. Range 03/22/2021 00:27  Glucose Latest Ref Range: 70 - 99 mg/dL 427 (H)   Diabetes history: Type 2 DM Outpatient Diabetes medications: Amaryl 1 mg qd Current orders for Inpatient glycemic control: none  Inpatient Diabetes Program Recommendations:    If appropriate, consider adding Novolog 0-9 units TID while awaiting Hospice Home placement as previous serum glucose >400's mg/dL.  Thanks, Bronson Curb, MSN, RNC-OB Diabetes Coordinator [phone] (8a-5p)

## 2021-03-31 DEATH — deceased

## 2022-08-07 LAB — HISTOPLASMA ANTIGEN, URINE: Histoplasma Antigen, urine: <0.5 (ref <0.5)
